# Patient Record
Sex: Female | Born: 1940 | Race: White | Hispanic: No | Marital: Married | Smoking: Light tobacco smoker
Health system: Southern US, Community
[De-identification: ages and names within clinical notes are randomized; demographics above are authoritative.]

## PROBLEM LIST (undated history)

## (undated) DIAGNOSIS — N289 Disorder of kidney and ureter, unspecified: Secondary | ICD-10-CM

## (undated) DIAGNOSIS — Z87442 Personal history of urinary calculi: Secondary | ICD-10-CM

## (undated) DIAGNOSIS — M1990 Unspecified osteoarthritis, unspecified site: Secondary | ICD-10-CM

## (undated) DIAGNOSIS — I714 Abdominal aortic aneurysm, without rupture, unspecified: Secondary | ICD-10-CM

## (undated) DIAGNOSIS — F32A Depression, unspecified: Secondary | ICD-10-CM

## (undated) DIAGNOSIS — C801 Malignant (primary) neoplasm, unspecified: Secondary | ICD-10-CM

## (undated) DIAGNOSIS — J189 Pneumonia, unspecified organism: Secondary | ICD-10-CM

## (undated) DIAGNOSIS — R011 Cardiac murmur, unspecified: Secondary | ICD-10-CM

## (undated) DIAGNOSIS — I10 Essential (primary) hypertension: Secondary | ICD-10-CM

## (undated) DIAGNOSIS — N390 Urinary tract infection, site not specified: Secondary | ICD-10-CM

## (undated) DIAGNOSIS — E785 Hyperlipidemia, unspecified: Secondary | ICD-10-CM

## (undated) DIAGNOSIS — G458 Other transient cerebral ischemic attacks and related syndromes: Secondary | ICD-10-CM

## (undated) DIAGNOSIS — Z8489 Family history of other specified conditions: Secondary | ICD-10-CM

## (undated) DIAGNOSIS — K219 Gastro-esophageal reflux disease without esophagitis: Secondary | ICD-10-CM

## (undated) DIAGNOSIS — F329 Major depressive disorder, single episode, unspecified: Secondary | ICD-10-CM

## (undated) HISTORY — DX: Pneumonia, unspecified organism: J18.9

## (undated) HISTORY — PX: CATARACT EXTRACTION: SUR2

## (undated) HISTORY — PX: BACK SURGERY: SHX140

## (undated) HISTORY — PX: OTHER SURGICAL HISTORY: SHX169

## (undated) HISTORY — DX: Disorder of kidney and ureter, unspecified: N28.9

## (undated) HISTORY — DX: Other transient cerebral ischemic attacks and related syndromes: G45.8

## (undated) HISTORY — DX: Hyperlipidemia, unspecified: E78.5

## (undated) HISTORY — PX: ABDOMINAL HYSTERECTOMY: SHX81

## (undated) HISTORY — DX: Urinary tract infection, site not specified: N39.0

## (undated) HISTORY — DX: Essential (primary) hypertension: I10

## (undated) HISTORY — PX: EYE SURGERY: SHX253

## (undated) HISTORY — PX: DILATION AND CURETTAGE OF UTERUS: SHX78

---

## 1998-11-30 ENCOUNTER — Other Ambulatory Visit: Admission: RE | Admit: 1998-11-30 | Discharge: 1998-11-30 | Payer: Self-pay | Admitting: Obstetrics & Gynecology

## 1998-12-14 ENCOUNTER — Ambulatory Visit (HOSPITAL_COMMUNITY): Admission: RE | Admit: 1998-12-14 | Discharge: 1998-12-14 | Payer: Self-pay | Admitting: Internal Medicine

## 2000-03-11 ENCOUNTER — Other Ambulatory Visit: Admission: RE | Admit: 2000-03-11 | Discharge: 2000-03-11 | Payer: Self-pay | Admitting: Obstetrics and Gynecology

## 2000-03-11 ENCOUNTER — Encounter (INDEPENDENT_AMBULATORY_CARE_PROVIDER_SITE_OTHER): Payer: Self-pay

## 2000-04-08 ENCOUNTER — Encounter: Admission: RE | Admit: 2000-04-08 | Discharge: 2000-04-08 | Payer: Self-pay | Admitting: Obstetrics and Gynecology

## 2000-04-08 ENCOUNTER — Encounter: Payer: Self-pay | Admitting: Obstetrics and Gynecology

## 2001-06-15 ENCOUNTER — Other Ambulatory Visit: Admission: RE | Admit: 2001-06-15 | Discharge: 2001-06-15 | Payer: Self-pay | Admitting: Obstetrics and Gynecology

## 2001-08-27 ENCOUNTER — Encounter: Payer: Self-pay | Admitting: Obstetrics and Gynecology

## 2001-08-27 ENCOUNTER — Encounter: Admission: RE | Admit: 2001-08-27 | Discharge: 2001-08-27 | Payer: Self-pay | Admitting: Obstetrics and Gynecology

## 2002-09-08 ENCOUNTER — Encounter: Admission: RE | Admit: 2002-09-08 | Discharge: 2002-09-08 | Payer: Self-pay | Admitting: Obstetrics and Gynecology

## 2002-09-08 ENCOUNTER — Encounter: Payer: Self-pay | Admitting: Obstetrics and Gynecology

## 2002-11-13 ENCOUNTER — Emergency Department (HOSPITAL_COMMUNITY): Admission: EM | Admit: 2002-11-13 | Discharge: 2002-11-14 | Payer: Self-pay | Admitting: Emergency Medicine

## 2003-09-22 ENCOUNTER — Encounter: Admission: RE | Admit: 2003-09-22 | Discharge: 2003-09-22 | Payer: Self-pay | Admitting: Obstetrics and Gynecology

## 2004-10-08 ENCOUNTER — Ambulatory Visit: Payer: Self-pay | Admitting: Internal Medicine

## 2004-11-05 ENCOUNTER — Encounter: Admission: RE | Admit: 2004-11-05 | Discharge: 2004-11-05 | Payer: Self-pay | Admitting: Obstetrics and Gynecology

## 2004-12-24 ENCOUNTER — Ambulatory Visit: Payer: Self-pay | Admitting: Internal Medicine

## 2004-12-31 ENCOUNTER — Ambulatory Visit: Payer: Self-pay | Admitting: Internal Medicine

## 2005-04-10 ENCOUNTER — Ambulatory Visit: Payer: Self-pay | Admitting: Internal Medicine

## 2005-06-09 ENCOUNTER — Ambulatory Visit: Payer: Self-pay | Admitting: Internal Medicine

## 2005-06-16 ENCOUNTER — Ambulatory Visit: Payer: Self-pay

## 2005-08-20 ENCOUNTER — Ambulatory Visit: Payer: Self-pay | Admitting: Internal Medicine

## 2005-12-09 ENCOUNTER — Encounter: Admission: RE | Admit: 2005-12-09 | Discharge: 2005-12-09 | Payer: Self-pay | Admitting: Obstetrics and Gynecology

## 2006-09-15 ENCOUNTER — Ambulatory Visit: Payer: Self-pay | Admitting: Internal Medicine

## 2006-09-15 LAB — CONVERTED CEMR LAB
Albumin: 4 g/dL (ref 3.5–5.2)
BUN: 18 mg/dL (ref 6–23)
CO2: 28 meq/L (ref 19–32)
Calcium: 9.5 mg/dL (ref 8.4–10.5)
Chloride: 101 meq/L (ref 96–112)
Creatinine, Ser: 1 mg/dL (ref 0.4–1.2)
GFR calc non Af Amer: 60 mL/min
Glomerular Filtration Rate, Af Am: 72 mL/min/{1.73_m2}
HDL: 38.5 mg/dL — ABNORMAL LOW (ref >39.0)
Hemoglobin: 13.1 g/dL (ref 12.0–15.0)
Hgb A1c MFr Bld: 6.9 % — ABNORMAL HIGH (ref 4.6–6.0)
LDL DIRECT: 95.3 mg/dL
Lymphocytes Relative: 34.2 % (ref 12.0–46.0)
MCHC: 34 g/dL (ref 30.0–36.0)
Monocytes Relative: 8 % (ref 3.0–11.0)
Platelets: 320 10*3/uL (ref 150–400)
Total Bilirubin: 0.6 mg/dL (ref 0.3–1.2)
Triglyceride fasting, serum: 263 mg/dL (ref 0–149)

## 2006-09-28 ENCOUNTER — Ambulatory Visit: Payer: Self-pay | Admitting: Internal Medicine

## 2006-12-29 ENCOUNTER — Ambulatory Visit: Payer: Self-pay | Admitting: Internal Medicine

## 2006-12-29 LAB — CONVERTED CEMR LAB
AST: 24 units/L (ref 0–37)
BUN: 14 mg/dL (ref 6–23)
Cholesterol: 154 mg/dL (ref 0–200)
Creatinine, Ser: 0.9 mg/dL (ref 0.4–1.2)
Direct LDL: 70.2 mg/dL
HDL: 43 mg/dL (ref >39.0)
Potassium: 3.4 meq/L — ABNORMAL LOW (ref 3.5–5.1)

## 2007-01-21 ENCOUNTER — Ambulatory Visit: Payer: Self-pay | Admitting: Internal Medicine

## 2007-03-11 ENCOUNTER — Encounter: Payer: Self-pay | Admitting: Internal Medicine

## 2007-04-07 DIAGNOSIS — R0989 Other specified symptoms and signs involving the circulatory and respiratory systems: Secondary | ICD-10-CM

## 2007-04-13 ENCOUNTER — Encounter: Admission: RE | Admit: 2007-04-13 | Discharge: 2007-04-13 | Payer: Self-pay | Admitting: Obstetrics and Gynecology

## 2007-05-19 ENCOUNTER — Ambulatory Visit: Payer: Self-pay | Admitting: Internal Medicine

## 2007-05-25 ENCOUNTER — Encounter (INDEPENDENT_AMBULATORY_CARE_PROVIDER_SITE_OTHER): Payer: Self-pay | Admitting: *Deleted

## 2007-05-25 LAB — CONVERTED CEMR LAB
Direct LDL: 91.8 mg/dL
HDL: 40.4 mg/dL (ref >39.0)
Triglycerides: 342 mg/dL (ref 0–149)

## 2007-06-09 ENCOUNTER — Encounter: Payer: Self-pay | Admitting: Internal Medicine

## 2007-12-20 ENCOUNTER — Telehealth: Payer: Self-pay | Admitting: Internal Medicine

## 2007-12-22 ENCOUNTER — Telehealth (INDEPENDENT_AMBULATORY_CARE_PROVIDER_SITE_OTHER): Payer: Self-pay | Admitting: *Deleted

## 2007-12-24 ENCOUNTER — Ambulatory Visit: Payer: Self-pay | Admitting: Internal Medicine

## 2007-12-24 LAB — CONVERTED CEMR LAB
BUN: 18 mg/dL (ref 6–23)
Potassium: 3.5 meq/L (ref 3.5–5.1)

## 2007-12-30 ENCOUNTER — Ambulatory Visit: Payer: Self-pay | Admitting: Internal Medicine

## 2007-12-30 DIAGNOSIS — E119 Type 2 diabetes mellitus without complications: Secondary | ICD-10-CM

## 2007-12-30 DIAGNOSIS — E1169 Type 2 diabetes mellitus with other specified complication: Secondary | ICD-10-CM | POA: Insufficient documentation

## 2007-12-30 DIAGNOSIS — E782 Mixed hyperlipidemia: Secondary | ICD-10-CM

## 2007-12-30 DIAGNOSIS — F17200 Nicotine dependence, unspecified, uncomplicated: Secondary | ICD-10-CM | POA: Insufficient documentation

## 2007-12-30 DIAGNOSIS — I10 Essential (primary) hypertension: Secondary | ICD-10-CM

## 2007-12-30 DIAGNOSIS — R002 Palpitations: Secondary | ICD-10-CM | POA: Insufficient documentation

## 2007-12-30 DIAGNOSIS — G458 Other transient cerebral ischemic attacks and related syndromes: Secondary | ICD-10-CM

## 2007-12-30 DIAGNOSIS — I152 Hypertension secondary to endocrine disorders: Secondary | ICD-10-CM | POA: Insufficient documentation

## 2007-12-31 ENCOUNTER — Ambulatory Visit: Payer: Self-pay | Admitting: Internal Medicine

## 2008-01-01 LAB — CONVERTED CEMR LAB
ALT: 18 units/L (ref 0–35)
AST: 18 units/L (ref 0–37)
Alkaline Phosphatase: 81 units/L (ref 39–117)
Bilirubin, Direct: 0.1 mg/dL (ref 0.0–0.3)
Cholesterol: 221 mg/dL (ref 0–200)
Creatinine,U: 73.4 mg/dL
Eosinophils Absolute: 0.8 10*3/uL — ABNORMAL HIGH (ref 0.0–0.6)
HCT: 38.6 % (ref 36.0–46.0)
Lymphocytes Relative: 38.3 % (ref 12.0–46.0)
Microalb Creat Ratio: 594 mg/g — ABNORMAL HIGH (ref 0.0–30.0)
Microalb, Ur: 43.6 mg/dL — ABNORMAL HIGH (ref 0.0–1.9)
Monocytes Absolute: 1.1 10*3/uL — ABNORMAL HIGH (ref 0.2–0.7)
Monocytes Relative: 10.5 % (ref 3.0–11.0)
Neutro Abs: 4.5 10*3/uL (ref 1.4–7.7)
Neutrophils Relative %: 43.8 % (ref 43.0–77.0)
Platelets: 298 10*3/uL (ref 150–400)
RBC: 4.25 M/uL (ref 3.87–5.11)
TSH: 2.06 microintl units/mL (ref 0.35–5.50)
Total Bilirubin: 0.6 mg/dL (ref 0.3–1.2)
Triglycerides: 330 mg/dL (ref 0–149)

## 2008-01-03 ENCOUNTER — Encounter (INDEPENDENT_AMBULATORY_CARE_PROVIDER_SITE_OTHER): Payer: Self-pay | Admitting: *Deleted

## 2008-01-06 ENCOUNTER — Telehealth (INDEPENDENT_AMBULATORY_CARE_PROVIDER_SITE_OTHER): Payer: Self-pay | Admitting: *Deleted

## 2008-01-12 ENCOUNTER — Encounter (INDEPENDENT_AMBULATORY_CARE_PROVIDER_SITE_OTHER): Payer: Self-pay | Admitting: Obstetrics and Gynecology

## 2008-01-13 ENCOUNTER — Inpatient Hospital Stay (HOSPITAL_COMMUNITY): Admission: RE | Admit: 2008-01-13 | Discharge: 2008-01-14 | Payer: Self-pay | Admitting: Obstetrics and Gynecology

## 2008-02-28 ENCOUNTER — Ambulatory Visit: Payer: Self-pay | Admitting: Internal Medicine

## 2008-03-01 ENCOUNTER — Telehealth (INDEPENDENT_AMBULATORY_CARE_PROVIDER_SITE_OTHER): Payer: Self-pay | Admitting: *Deleted

## 2008-03-14 ENCOUNTER — Telehealth (INDEPENDENT_AMBULATORY_CARE_PROVIDER_SITE_OTHER): Payer: Self-pay | Admitting: *Deleted

## 2008-03-29 ENCOUNTER — Ambulatory Visit: Payer: Self-pay | Admitting: Internal Medicine

## 2008-03-30 ENCOUNTER — Encounter (INDEPENDENT_AMBULATORY_CARE_PROVIDER_SITE_OTHER): Payer: Self-pay | Admitting: *Deleted

## 2008-04-05 ENCOUNTER — Ambulatory Visit: Payer: Self-pay | Admitting: Internal Medicine

## 2008-04-09 LAB — CONVERTED CEMR LAB
BUN: 16 mg/dL (ref 6–23)
Cholesterol: 206 mg/dL (ref 0–200)
Creatinine, Ser: 0.9 mg/dL (ref 0.4–1.2)
Direct LDL: 96 mg/dL
HDL: 33.6 mg/dL — ABNORMAL LOW (ref >39.0)
Hgb A1c MFr Bld: 6.5 % — ABNORMAL HIGH (ref 4.6–6.0)
Potassium: 3 meq/L — ABNORMAL LOW (ref 3.5–5.1)
Total CHOL/HDL Ratio: 6.1
Triglycerides: 314 mg/dL (ref 0–149)
VLDL: 63 mg/dL — ABNORMAL HIGH (ref 0–40)

## 2008-04-10 ENCOUNTER — Encounter (INDEPENDENT_AMBULATORY_CARE_PROVIDER_SITE_OTHER): Payer: Self-pay | Admitting: *Deleted

## 2008-04-14 ENCOUNTER — Telehealth (INDEPENDENT_AMBULATORY_CARE_PROVIDER_SITE_OTHER): Payer: Self-pay | Admitting: *Deleted

## 2008-04-27 ENCOUNTER — Ambulatory Visit: Payer: Self-pay | Admitting: Cardiology

## 2008-04-28 ENCOUNTER — Ambulatory Visit: Payer: Self-pay | Admitting: Internal Medicine

## 2008-05-04 ENCOUNTER — Telehealth (INDEPENDENT_AMBULATORY_CARE_PROVIDER_SITE_OTHER): Payer: Self-pay | Admitting: *Deleted

## 2008-05-19 ENCOUNTER — Encounter: Payer: Self-pay | Admitting: Cardiology

## 2008-05-19 ENCOUNTER — Ambulatory Visit: Payer: Self-pay

## 2008-05-29 ENCOUNTER — Telehealth (INDEPENDENT_AMBULATORY_CARE_PROVIDER_SITE_OTHER): Payer: Self-pay | Admitting: *Deleted

## 2008-06-05 ENCOUNTER — Ambulatory Visit: Payer: Self-pay | Admitting: Internal Medicine

## 2008-06-07 ENCOUNTER — Ambulatory Visit: Payer: Self-pay | Admitting: Cardiology

## 2008-06-08 ENCOUNTER — Encounter (INDEPENDENT_AMBULATORY_CARE_PROVIDER_SITE_OTHER): Payer: Self-pay | Admitting: *Deleted

## 2008-07-10 ENCOUNTER — Ambulatory Visit: Payer: Self-pay | Admitting: Internal Medicine

## 2008-07-18 ENCOUNTER — Encounter (INDEPENDENT_AMBULATORY_CARE_PROVIDER_SITE_OTHER): Payer: Self-pay | Admitting: *Deleted

## 2008-07-18 LAB — CONVERTED CEMR LAB
ALT: 17 units/L (ref 0–35)
AST: 19 units/L (ref 0–37)
Cholesterol: 165 mg/dL (ref 0–200)
Direct LDL: 100.2 mg/dL

## 2008-07-19 ENCOUNTER — Ambulatory Visit: Payer: Self-pay | Admitting: Internal Medicine

## 2008-07-19 DIAGNOSIS — M25539 Pain in unspecified wrist: Secondary | ICD-10-CM | POA: Insufficient documentation

## 2008-07-21 ENCOUNTER — Encounter (INDEPENDENT_AMBULATORY_CARE_PROVIDER_SITE_OTHER): Payer: Self-pay | Admitting: *Deleted

## 2008-07-21 LAB — CONVERTED CEMR LAB
BUN: 24 mg/dL — ABNORMAL HIGH (ref 6–23)
Creatinine, Ser: 1.1 mg/dL (ref 0.4–1.2)
Potassium: 4.7 meq/L (ref 3.5–5.1)

## 2008-07-31 ENCOUNTER — Telehealth: Payer: Self-pay | Admitting: Internal Medicine

## 2008-08-09 ENCOUNTER — Encounter: Admission: RE | Admit: 2008-08-09 | Discharge: 2008-08-09 | Payer: Self-pay | Admitting: Internal Medicine

## 2008-09-06 ENCOUNTER — Ambulatory Visit: Payer: Self-pay | Admitting: Internal Medicine

## 2009-01-10 ENCOUNTER — Ambulatory Visit: Payer: Self-pay | Admitting: Internal Medicine

## 2009-01-10 LAB — CONVERTED CEMR LAB
ALT: 15 units/L (ref 0–35)
AST: 19 units/L (ref 0–37)
Albumin: 4.3 g/dL (ref 3.5–5.2)
Alkaline Phosphatase: 77 units/L (ref 39–117)
BUN: 22 mg/dL (ref 6–23)
Bilirubin, Direct: 0.1 mg/dL (ref 0.0–0.3)
HDL: 33.3 mg/dL — ABNORMAL LOW (ref >39.0)
Hgb A1c MFr Bld: 6.6 % — ABNORMAL HIGH (ref 4.6–6.0)
Microalb Creat Ratio: 120.6 mg/g — ABNORMAL HIGH (ref 0.0–30.0)
Potassium: 4.2 meq/L (ref 3.5–5.1)
Total Bilirubin: 0.7 mg/dL (ref 0.3–1.2)
Total CHOL/HDL Ratio: 5.3
Total Protein: 7.9 g/dL (ref 6.0–8.3)

## 2009-01-17 ENCOUNTER — Ambulatory Visit: Payer: Self-pay | Admitting: Internal Medicine

## 2009-02-03 DIAGNOSIS — R011 Cardiac murmur, unspecified: Secondary | ICD-10-CM

## 2009-03-26 ENCOUNTER — Encounter (INDEPENDENT_AMBULATORY_CARE_PROVIDER_SITE_OTHER): Payer: Self-pay | Admitting: *Deleted

## 2009-06-01 ENCOUNTER — Encounter (INDEPENDENT_AMBULATORY_CARE_PROVIDER_SITE_OTHER): Payer: Self-pay | Admitting: *Deleted

## 2009-06-11 ENCOUNTER — Ambulatory Visit: Payer: Self-pay | Admitting: Internal Medicine

## 2009-06-11 DIAGNOSIS — IMO0001 Reserved for inherently not codable concepts without codable children: Secondary | ICD-10-CM | POA: Insufficient documentation

## 2009-06-20 ENCOUNTER — Encounter: Admission: RE | Admit: 2009-06-20 | Discharge: 2009-07-25 | Payer: Self-pay | Admitting: Internal Medicine

## 2009-07-11 ENCOUNTER — Ambulatory Visit: Payer: Self-pay | Admitting: Internal Medicine

## 2009-07-17 ENCOUNTER — Encounter (INDEPENDENT_AMBULATORY_CARE_PROVIDER_SITE_OTHER): Payer: Self-pay | Admitting: *Deleted

## 2009-07-17 LAB — CONVERTED CEMR LAB
Cholesterol: 211 mg/dL — ABNORMAL HIGH (ref 0–200)
Creatinine,U: 89.9 mg/dL
HDL: 40.2 mg/dL (ref >39.00)
Hgb A1c MFr Bld: 6.5 % (ref 4.6–6.5)
Triglycerides: 385 mg/dL — ABNORMAL HIGH (ref 0.0–149.0)

## 2009-07-18 ENCOUNTER — Ambulatory Visit: Payer: Self-pay | Admitting: Cardiology

## 2009-07-20 ENCOUNTER — Ambulatory Visit: Payer: Self-pay

## 2009-07-20 ENCOUNTER — Encounter: Payer: Self-pay | Admitting: Cardiology

## 2009-07-25 ENCOUNTER — Ambulatory Visit: Payer: Self-pay | Admitting: Internal Medicine

## 2009-07-25 DIAGNOSIS — M25519 Pain in unspecified shoulder: Secondary | ICD-10-CM | POA: Insufficient documentation

## 2009-07-26 ENCOUNTER — Encounter: Payer: Self-pay | Admitting: Internal Medicine

## 2009-08-01 ENCOUNTER — Telehealth (INDEPENDENT_AMBULATORY_CARE_PROVIDER_SITE_OTHER): Payer: Self-pay | Admitting: *Deleted

## 2009-08-21 ENCOUNTER — Encounter: Payer: Self-pay | Admitting: Internal Medicine

## 2009-08-29 ENCOUNTER — Encounter: Admission: RE | Admit: 2009-08-29 | Discharge: 2009-08-29 | Payer: Self-pay | Admitting: Internal Medicine

## 2009-08-30 ENCOUNTER — Ambulatory Visit: Payer: Self-pay | Admitting: Internal Medicine

## 2009-09-17 ENCOUNTER — Telehealth (INDEPENDENT_AMBULATORY_CARE_PROVIDER_SITE_OTHER): Payer: Self-pay | Admitting: *Deleted

## 2009-10-03 ENCOUNTER — Encounter: Payer: Self-pay | Admitting: Internal Medicine

## 2009-10-29 ENCOUNTER — Telehealth (INDEPENDENT_AMBULATORY_CARE_PROVIDER_SITE_OTHER): Payer: Self-pay | Admitting: *Deleted

## 2009-11-12 ENCOUNTER — Telehealth (INDEPENDENT_AMBULATORY_CARE_PROVIDER_SITE_OTHER): Payer: Self-pay | Admitting: *Deleted

## 2009-11-26 ENCOUNTER — Ambulatory Visit: Payer: Self-pay | Admitting: Internal Medicine

## 2009-11-29 LAB — CONVERTED CEMR LAB
Direct LDL: 76 mg/dL
HDL: 33.2 mg/dL — ABNORMAL LOW (ref >39.00)
Hgb A1c MFr Bld: 6.6 % — ABNORMAL HIGH (ref 4.6–6.5)

## 2009-12-10 ENCOUNTER — Ambulatory Visit: Payer: Self-pay | Admitting: Internal Medicine

## 2010-01-16 ENCOUNTER — Telehealth (INDEPENDENT_AMBULATORY_CARE_PROVIDER_SITE_OTHER): Payer: Self-pay | Admitting: *Deleted

## 2010-02-20 ENCOUNTER — Encounter: Payer: Self-pay | Admitting: Internal Medicine

## 2010-02-23 ENCOUNTER — Encounter: Admission: RE | Admit: 2010-02-23 | Discharge: 2010-02-23 | Payer: Self-pay | Admitting: Orthopedic Surgery

## 2010-02-25 ENCOUNTER — Telehealth (INDEPENDENT_AMBULATORY_CARE_PROVIDER_SITE_OTHER): Payer: Self-pay | Admitting: *Deleted

## 2010-04-05 ENCOUNTER — Telehealth (INDEPENDENT_AMBULATORY_CARE_PROVIDER_SITE_OTHER): Payer: Self-pay | Admitting: *Deleted

## 2010-06-03 ENCOUNTER — Ambulatory Visit: Payer: Self-pay | Admitting: Internal Medicine

## 2010-06-03 LAB — CONVERTED CEMR LAB
Cholesterol: 202 mg/dL — ABNORMAL HIGH (ref 0–200)
Hgb A1c MFr Bld: 6.9 % — ABNORMAL HIGH (ref 4.6–6.5)
Triglycerides: 383 mg/dL — ABNORMAL HIGH (ref 0.0–149.0)
VLDL: 76.6 mg/dL — ABNORMAL HIGH (ref 0.0–40.0)

## 2010-06-10 ENCOUNTER — Ambulatory Visit: Payer: Self-pay | Admitting: Internal Medicine

## 2010-06-10 DIAGNOSIS — R1084 Generalized abdominal pain: Secondary | ICD-10-CM | POA: Insufficient documentation

## 2010-06-10 DIAGNOSIS — M549 Dorsalgia, unspecified: Secondary | ICD-10-CM | POA: Insufficient documentation

## 2010-06-10 LAB — CONVERTED CEMR LAB
Blood in Urine, dipstick: NEGATIVE
Ketones, urine, test strip: NEGATIVE
Protein, U semiquant: NEGATIVE
Urobilinogen, UA: NEGATIVE
WBC Urine, dipstick: NEGATIVE
pH: 6

## 2010-06-11 ENCOUNTER — Encounter (INDEPENDENT_AMBULATORY_CARE_PROVIDER_SITE_OTHER): Payer: Self-pay | Admitting: *Deleted

## 2010-06-11 ENCOUNTER — Encounter: Payer: Self-pay | Admitting: Internal Medicine

## 2010-06-11 LAB — CONVERTED CEMR LAB
Albumin: 4.5 g/dL (ref 3.5–5.2)
Amylase: 83 units/L (ref 27–131)
Basophils Absolute: 0.1 10*3/uL (ref 0.0–0.1)
Basophils Relative: 0.7 % (ref 0.0–3.0)
Bilirubin, Direct: 0.1 mg/dL (ref 0.0–0.3)
Eosinophils Absolute: 0.5 10*3/uL (ref 0.0–0.7)
Eosinophils Relative: 4.6 % (ref 0.0–5.0)
HCT: 34.7 % — ABNORMAL LOW (ref 36.0–46.0)
MCHC: 33.9 g/dL (ref 30.0–36.0)
Monocytes Absolute: 1.1 10*3/uL — ABNORMAL HIGH (ref 0.1–1.0)
Monocytes Relative: 9.2 % (ref 3.0–12.0)
Platelets: 367 10*3/uL (ref 150.0–400.0)
RBC: 3.77 M/uL — ABNORMAL LOW (ref 3.87–5.11)

## 2010-06-13 ENCOUNTER — Encounter: Admission: RE | Admit: 2010-06-13 | Discharge: 2010-06-13 | Payer: Self-pay | Admitting: Internal Medicine

## 2010-07-05 ENCOUNTER — Telehealth (INDEPENDENT_AMBULATORY_CARE_PROVIDER_SITE_OTHER): Payer: Self-pay | Admitting: *Deleted

## 2010-07-08 ENCOUNTER — Encounter: Payer: Self-pay | Admitting: Internal Medicine

## 2010-07-10 ENCOUNTER — Telehealth (INDEPENDENT_AMBULATORY_CARE_PROVIDER_SITE_OTHER): Payer: Self-pay | Admitting: *Deleted

## 2010-07-23 ENCOUNTER — Telehealth (INDEPENDENT_AMBULATORY_CARE_PROVIDER_SITE_OTHER): Payer: Self-pay | Admitting: *Deleted

## 2010-07-24 ENCOUNTER — Ambulatory Visit: Payer: Self-pay | Admitting: Gastroenterology

## 2010-07-24 ENCOUNTER — Encounter (INDEPENDENT_AMBULATORY_CARE_PROVIDER_SITE_OTHER): Payer: Self-pay | Admitting: *Deleted

## 2010-07-24 DIAGNOSIS — K529 Noninfective gastroenteritis and colitis, unspecified: Secondary | ICD-10-CM | POA: Insufficient documentation

## 2010-07-24 DIAGNOSIS — R197 Diarrhea, unspecified: Secondary | ICD-10-CM

## 2010-07-25 LAB — CONVERTED CEMR LAB
ALT: 20 units/L (ref 0–35)
AST: 21 units/L (ref 0–37)
Basophils Absolute: 0.1 10*3/uL (ref 0.0–0.1)
CO2: 24 meq/L (ref 19–32)
Chloride: 108 meq/L (ref 96–112)
Eosinophils Absolute: 0.7 10*3/uL (ref 0.0–0.7)
GFR calc non Af Amer: 53.69 mL/min (ref >60)
Hemoglobin: 11.8 g/dL — ABNORMAL LOW (ref 12.0–15.0)
Lymphocytes Relative: 35.5 % (ref 12.0–46.0)
Lymphs Abs: 4.1 10*3/uL — ABNORMAL HIGH (ref 0.7–4.0)
MCHC: 34.4 g/dL (ref 30.0–36.0)
MCV: 90.8 fL (ref 78.0–100.0)
Neutro Abs: 5.5 10*3/uL (ref 1.4–7.7)
Potassium: 4.6 meq/L (ref 3.5–5.1)
RDW: 13.3 % (ref 11.5–14.6)
Sed Rate: 42 mm/hr — ABNORMAL HIGH (ref 0–22)
Sodium: 140 meq/L (ref 135–145)
TSH: 1.45 microintl units/mL (ref 0.35–5.50)

## 2010-07-26 ENCOUNTER — Encounter: Payer: Self-pay | Admitting: Internal Medicine

## 2010-07-30 ENCOUNTER — Encounter: Payer: Self-pay | Admitting: Internal Medicine

## 2010-08-02 ENCOUNTER — Ambulatory Visit: Payer: Self-pay | Admitting: Gastroenterology

## 2010-08-07 ENCOUNTER — Encounter: Payer: Self-pay | Admitting: Internal Medicine

## 2010-08-30 ENCOUNTER — Encounter: Admission: RE | Admit: 2010-08-30 | Discharge: 2010-08-30 | Payer: Self-pay | Admitting: Internal Medicine

## 2010-08-30 LAB — HM MAMMOGRAPHY: HM Mammogram: NEGATIVE

## 2010-09-04 ENCOUNTER — Ambulatory Visit: Payer: Self-pay | Admitting: Internal Medicine

## 2010-09-04 ENCOUNTER — Ambulatory Visit: Payer: Self-pay | Admitting: Gastroenterology

## 2010-09-04 DIAGNOSIS — J441 Chronic obstructive pulmonary disease with (acute) exacerbation: Secondary | ICD-10-CM | POA: Insufficient documentation

## 2010-09-05 LAB — CONVERTED CEMR LAB
Basophils Relative: 0.3 % (ref 0.0–3.0)
Eosinophils Relative: 2 % (ref 0.0–5.0)
HCT: 34.3 % — ABNORMAL LOW (ref 36.0–46.0)
Hemoglobin: 11.6 g/dL — ABNORMAL LOW (ref 12.0–15.0)
Lymphs Abs: 3.3 10*3/uL (ref 0.7–4.0)
Monocytes Absolute: 2.3 10*3/uL — ABNORMAL HIGH (ref 0.1–1.0)
Monocytes Relative: 11.9 % (ref 3.0–12.0)
Platelets: 316 10*3/uL (ref 150.0–400.0)

## 2010-09-06 ENCOUNTER — Telehealth: Payer: Self-pay | Admitting: Internal Medicine

## 2010-09-11 ENCOUNTER — Telehealth: Payer: Self-pay | Admitting: Internal Medicine

## 2010-09-12 ENCOUNTER — Telehealth: Payer: Self-pay | Admitting: Internal Medicine

## 2010-09-23 ENCOUNTER — Telehealth: Payer: Self-pay | Admitting: Internal Medicine

## 2010-10-07 ENCOUNTER — Telehealth (INDEPENDENT_AMBULATORY_CARE_PROVIDER_SITE_OTHER): Payer: Self-pay | Admitting: *Deleted

## 2010-10-11 ENCOUNTER — Ambulatory Visit: Payer: Self-pay | Admitting: Internal Medicine

## 2010-10-14 LAB — CONVERTED CEMR LAB: Vit D, 25-Hydroxy: 87 ng/mL (ref 30–89)

## 2010-10-22 ENCOUNTER — Encounter: Payer: Self-pay | Admitting: Internal Medicine

## 2010-10-22 ENCOUNTER — Ambulatory Visit: Payer: Self-pay

## 2010-10-22 DIAGNOSIS — I739 Peripheral vascular disease, unspecified: Secondary | ICD-10-CM

## 2010-10-24 ENCOUNTER — Ambulatory Visit: Payer: Self-pay | Admitting: Internal Medicine

## 2010-10-24 DIAGNOSIS — I10 Essential (primary) hypertension: Secondary | ICD-10-CM | POA: Insufficient documentation

## 2010-10-29 ENCOUNTER — Ambulatory Visit: Payer: Self-pay | Admitting: Cardiology

## 2010-10-29 ENCOUNTER — Encounter: Payer: Self-pay | Admitting: Cardiology

## 2010-11-13 ENCOUNTER — Ambulatory Visit
Admission: RE | Admit: 2010-11-13 | Discharge: 2010-11-13 | Payer: Self-pay | Source: Home / Self Care | Attending: Gastroenterology | Admitting: Gastroenterology

## 2010-11-20 ENCOUNTER — Ambulatory Visit: Admission: RE | Admit: 2010-11-20 | Discharge: 2010-11-20 | Payer: Self-pay | Source: Home / Self Care

## 2010-11-20 ENCOUNTER — Encounter: Payer: Self-pay | Admitting: Cardiology

## 2010-11-22 ENCOUNTER — Telehealth: Payer: Self-pay | Admitting: Internal Medicine

## 2010-12-08 LAB — CONVERTED CEMR LAB
Cholesterol, target level: 200 mg/dL
HDL goal, serum: 40 mg/dL

## 2010-12-12 NOTE — Letter (Signed)
Summary: Diabetic Instructions  [facility]  [place], [place] [address]   Phone: [phone]  Fax: [phone]    Sheila [facility]ton 11-Jan-1941 MRN: [id number]   X   ORAL DIABETIC MEDICATION INSTRUCTIONS  The day before your procedure:   Take your diabetic pill as you do normally  The day of your procedure:   Do not take your diabetic pill    We will check your blood sugar levels during the admission process and again in Recovery before discharging you home  ________________________________________________________________________

## 2010-12-12 NOTE — Assessment & Plan Note (Signed)
Summary: HIGH BP//PH   Vital Signs:  Patient profile:   70 year old female Weight:      157.2 pounds BMI:     26.25 Temp:     98.3 degrees F oral Pulse rate:   112 / minute BP sitting:   140 / 88  (left arm) Cuff size:   large  Vitals Entered By: Georgette Dover CMA (October 24, 2010 9:13 AM) CC: 1.) Elevated B/P and increased pulse  2.) Refill Valium   Primary Care Provider:  Unice Cobble, MD   CC:  1.) Elevated B/P and increased pulse  2.) Refill Valium.  History of Present Illness:      This is a 70 year old woman who presents for follow up of poorly controlled  Hypertension.BP was profoundly elevated @ [facility]. That report reviewed & copy given to her.Diovan was increased to twice daily.BP had been controlled with  Amlodipine 5 mg twice daily. It was stopped 07/23/10  due to  concomitant therapy with Simvastatin 40 mg daily. On that statin she has had leg  cramps which have resolved after it was stopped 2 weeks ago. Buttocks cramps are also slightly better , "best in 6 months".The patient denies lightheadedness, urinary frequency, headaches, edema, and fatigue.  Associated symptoms include palpitations as occasional fluttering . She did not have this on the CCB, Amlodipine.  The patient denies the following associated symptoms: chest pain, chest pressure, dyspnea, and syncope.  The patient reports that dietary compliance has been good.  The patient reports no exercise.  Adjunctive measures currently used by the patient include salt restriction.    Current Medications (verified): 1)  Simvastatin 40 Mg  Tabs (Simvastatin) .... Take One Tablet Daily 2)  Metformin Hcl 500 Mg  Tabs (Metformin Hcl) .Marland Kitchen.. 1 By Mouth Twice Daily With Two Largest Meals **lab Appointment Due** 3)  Valium 5 Mg Tabs (Diazepam) .... 1/2 To 1 By Mouth As Needed 4)  Onetouch Ultra Test  Strp (Glucose Blood) .... Test Once Daily As Directed 5)  Labetalol Hcl 300 Mg Tabs (Labetalol Hcl) .Marland Kitchen.. 1 By Mouth  Bid 6)  Ester-C  Tabs (Bioflavonoid Products) .Marland Kitchen.. 1 Tab By Mouth Once Daily 7)  Spironolactone 25 Mg  Tabs (Spironolactone) .... Take One Tablet Daily 8)  Allegra 180 Mg  Tabs (Fexofenadine Hcl) .... As Needed 9)  Ibuprofen 200 Mg Tabs (Ibuprofen) .... Up To 6 Tabs Daily 10)  Glucobetic .Marland Kitchen.. 1 By Mouth Two Times A Day 11)  New Life Formula For Women .Marland Kitchen.. 1 By Mouth Once Daily 12)  Wild Yam Cream .... As Directed 13)  Diovan 160 Mg Tabs (Valsartan) .Marland Kitchen.. 1 Once Daily 14)  Vitamin D3 400 Unit Tabs (Cholecalciferol) .Marland Kitchen.. 1 Tab By Mouth Once Daily 15)  Vitamin B-12 100 Mcg Tabs (Cyanocobalamin) .Marland Kitchen.. 1 By Mouth Once Daily 16)  Vitamin E 100 Unit Caps (Vitamin E) .Marland Kitchen.. 1 By Mouth Once Daily 17)  Prednisone 5 Mg  Tabs (Prednisone) .... Take As Directed  Allergies: 1)  ! Amoxicillin 2)  ! Captopril (Captopril) 3)  ! Simvastatin (Simvastatin)  Physical Exam  General:  in no acute distress; alert,appropriate and cooperative throughout examination Lungs:  Normal respiratory effort, chest expands symmetrically. Lungs are clear to auscultation, no crackles or wheezes. Heart:  normal rate, regular rhythm, no gallop, no rub, no JVD, no HJR, and grade 1 /6 systolic murmur.   Abdomen:  Bowel sounds positive,abdomen soft and non-tender without masses, organomegaly or hernias noted.  No AAA or bruits Pulses:  R and L carotid,radial,dorsalis pedis and posterior tibial pulses are full and equal bilaterally. L carotid bruit Extremities:  No clubbing, cyanosis, edema. Neurologic:  alert & oriented X3.   Skin:  Intact without suspicious lesions or rashes. No ischemic changes of feet   Impression & Recommendations:  Problem # 1:  UNSPECIFIED ESSENTIAL HYPERTENSION (ICD-401.9) UNCONTROLLED Her updated medication list for this problem includes:    Labetalol Hcl 300 Mg Tabs (Labetalol hcl) .Marland Kitchen... 1 by mouth bid    Spironolactone 25 Mg Tabs (Spironolactone) .Marland Kitchen... Take one tablet daily    Diovan 160 Mg Tabs  (Valsartan) .Marland Kitchen... 1 once daily    Amlodipine Besylate 5 Mg Tabs (Amlodipine besylate) .Marland Kitchen... 1 two times a day  Problem # 2:  SMOKER (ICD-305.1) Pathophysiology of Nicotine addiction discussed; risks & options discussed  Complete Medication List: 1)  Metformin Hcl 500 Mg Tabs (Metformin hcl) .Marland Kitchen.. 1 by mouth twice daily with two largest meals **lab appointment due** 2)  Valium 5 Mg Tabs (Diazepam) .... 1/2 to 1 by mouth as needed 3)  Onetouch Ultra Test Strp (Glucose blood) .... Test once daily as directed 4)  Labetalol Hcl 300 Mg Tabs (Labetalol hcl) .Marland Kitchen.. 1 by mouth bid 5)  Ester-c Tabs (Bioflavonoid products) .Marland Kitchen.. 1 tab by mouth once daily 6)  Spironolactone 25 Mg Tabs (Spironolactone) .... Take one tablet daily 7)  Allegra 180 Mg Tabs (Fexofenadine hcl) .... As needed 8)  Ibuprofen 200 Mg Tabs (Ibuprofen) .... Up to 6 tabs daily 9)  Glucobetic  .Marland Kitchen.. 1 by mouth two times a day 10)  New Life Formula For Women  .Marland Kitchen.. 1 by mouth once daily 11)  Wild Yam Cream  .... As directed 12)  Diovan 160 Mg Tabs (Valsartan) .Marland Kitchen.. 1 once daily 13)  Vitamin D3 400 Unit Tabs (Cholecalciferol) .Marland Kitchen.. 1 tab by mouth once daily 14)  Vitamin B-12 100 Mcg Tabs (Cyanocobalamin) .Marland Kitchen.. 1 by mouth once daily 15)  Vitamin E 100 Unit Caps (Vitamin e) .Marland Kitchen.. 1 by mouth once daily 16)  Prednisone 5 Mg Tabs (Prednisone) .... Take as directed 17)  Amlodipine Besylate 5 Mg Tabs (Amlodipine besylate) .Marland Kitchen.. 1 two times a day 18)  Crestor 10 Mg Tabs (Rosuvastatin calcium) .... 1/2 once daily  Patient Instructions: 1)  Please schedule a follow-up appointment in 3 months. 2)  Stop Smoking Tips: Choose a Quit date. Cut down before the Quit date. decide what you will do as a substitute when you feel the urge to smoke(gum,toothpick,exercise). 3)  CPK  prior to visit, ICD-9:995.20,729.1 4)  Hepatic Panel prior to visit, ICD-9:995.20 5)  Lipid Panel prior to visit, ICD-9:272.4 6)  Check your Blood Pressure regularly. If it is  above:135/85 ON AVERAGE  you should make an appointment. 7)  HbgA1C prior to visit, ICD-9:250.00 Prescriptions: CRESTOR 10 MG TABS (ROSUVASTATIN CALCIUM) 1/2 once daily  #42 x 0   Entered and Authorized by:   Unice Cobble MD   Signed by:   Unice Cobble MD on 10/24/2010   Method used:   Samples Given   RxID:   [id number]    Orders Added: 1)  Est. Patient Level IV [id number]

## 2010-12-12 NOTE — Progress Notes (Signed)
Summary: Refill Request  Phone Note Refill Request Message from:  Pharmacy on [facility] Fax#: [phone]  Refills Requested: Medication #1:  METFORMIN HCL 500 MG  TABS TAKE ONE TABLET TWICE DAILY WITH TWO LARGEST MEALS   Dosage confirmed as above?Dosage Confirmed   Supply Requested: 3 months   Notes: 1 refill Initial call taken by: Elna Breslow,  February 25, 2010 9:32 AM  Follow-up for Phone Call        Faxed to [phone] Follow-up by: Georgette Dover,  February 25, 2010 10:18 AM    Prescriptions: METFORMIN HCL 500 MG  TABS (METFORMIN HCL) TAKE ONE TABLET TWICE DAILY WITH TWO LARGEST MEALS  #180 x 1   Entered by:   Georgette Dover   Authorized by:   Unice Cobble MD   Signed by:   Georgette Dover on 02/25/2010   Method used:   Print then Give to Patient   RxID:   [id number]

## 2010-12-12 NOTE — Assessment & Plan Note (Signed)
History of Present Illness Visit Type: consult  Primary GI MD: Owens Loffler MD Primary Provider: Unice Cobble, MD  Requesting Provider: Unice Cobble, MD  Chief Complaint: diarrhea  History of Present Illness:      very pleasant 70 year old woman Who has been having buttocks discomforts.  Could not walk very well because of the pains.  then pains on both sides, flanks.  Myopathy like pains.  Then diarrhea started.  Never had diarrhea before.  Would go up to 6 times a day.  diarrhea lasted for 4-5 weeks Stopped amlodipine yesterday and had NO loose stools today.  Non-bloody, no nocturnal symptoms.  no abdomina pains.  was recently told by Dr. Linna Darner that simvastatin, amlodipine combination can be bad.    may have been on antibiotics this past summer.             Current Medications (verified): 1)  Simvastatin 40 Mg  Tabs (Simvastatin) .... Take One Tablet Daily 2)  Metformin Hcl 500 Mg  Tabs (Metformin Hcl) .... Take One Tablet Twice Daily With Two Largest Meals 3)  Valium 5 Mg Tabs (Diazepam) .... 1/2 To 1 By Mouth As Needed 4)  Onetouch Ultra Test  Strp (Glucose Blood) .... Test Once Daily As Directed 5)  Labetalol Hcl 200 Mg Tabs (Labetalol Hcl) .Marland Kitchen.. 1 1/2 By Mouth Two Times A Day 6)  Ester-C  Tabs (Bioflavonoid Products) .Marland Kitchen.. 1 Tab By Mouth Once Daily 7)  Spironolactone 25 Mg  Tabs (Spironolactone) .... Take One Tablet Daily 8)  Allegra 180 Mg  Tabs (Fexofenadine Hcl) .... As Needed 9)  Tylenol Extra Strength 500 Mg Tabs (Acetaminophen) .Marland Kitchen.. 1 By Mouth As Needed 10)  Tylenol Arthritis Pain 650 Mg Cr-Tabs (Acetaminophen) .Marland Kitchen.. 1 By Mouth At Bedtime 11)  Glucobetic .Marland Kitchen.. 1 By Mouth Two Times A Day 12)  New Life Formula For Women .Marland Kitchen.. 1 By Mouth Once Daily 13)  Wild Yam Cream .... As Directed 14)  Diovan 160 Mg Tabs (Valsartan) .Marland Kitchen.. 1 Once Daily 15)  Vitamin D3 400 Unit Tabs (Cholecalciferol) .Marland Kitchen.. 1 Tab By Mouth Once Daily 16)  Vitamin B-12 100 Mcg Tabs (Cyanocobalamin)  .Marland Kitchen.. 1 By Mouth Once Daily 17)  Vitamin E 100 Unit Caps (Vitamin E) .Marland Kitchen.. 1 By Mouth Once Daily  Allergies (verified): 1)  ! Amoxicillin 2)  ! 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) UNSPECIFIED ESSENTIAL HYPERTENSION (ICD-401.9) DIABETES MELLITUS, TYPE II (ICD-250.00) Family Hx of CONGESTIVE HEART FAILURE (ICD-428.0) FAMILY HISTORY DIABETES 1ST DEGREE RELATIVE (ICD-V18.0) FAMILY HISTORY OF CAD FEMALE 1ST DEGREE RELATIVE <60 (ICD-V16.49) CAROTID BRUIT (ICD-785.9)     Past Surgical History: G1 P1;D&c Cataract extraction OS   Family History: Family History of CAD Female 1st degree relative <60 Family History Diabetes 1st degree relative Mother: died at 31 of CHF  Siblings: Family History of Coronary Artery Disease:  Family History of Hypertension:    Social History: Current Smoker Alcohol use-no retired drinks no alcohol, drinks 3 caffeinated beverages a day  Review of Systems       Pertinent positive and negative review of systems were noted in the above HPI and GI specific review of systems.  All other review of systems was otherwise negative.   Vital Signs:  Patient profile:   70 year old female Height:      65 inches Weight:  157 pounds BMI:     26.22 BSA:     1.79 Pulse rate:   80 / minute Pulse rhythm:   regular BP sitting:   132 / 80  (left arm) Cuff size:   regular  Vitals Entered By: Hope Pigeon CMA (July 24, 2010 2:39 PM)  Physical Exam  Additional Exam:  Constitutional: generally well appearing Psychiatric: alert and oriented times 3 Eyes: extraocular movements intact Mouth: oropharynx moist, no lesions Neck: supple, no lymphadenopathy Cardiovascular: heart regular rate and rythm Lungs: CTA  bilaterally Abdomen: soft, non-tender, non-distended, no obvious ascites, no peritoneal signs, normal bowel sounds Extremities: no lower extremity edema bilaterally Skin: no lesions on visible extremities    Impression & Recommendations:  Problem # 1:  Diarrhea diarrhea is listed as a side effect to simvastatin however is quite down list. It is not listed as a side effect for amlodipine. She really thinks that this combination of medicines has caused her diarrhea perhaps he is right however think we should proceed with full colonoscopy to exclude other causes such as neoplasm, inflammatory bowel disease, perhaps infectious etiology. She also get a basic set of labs including a CBC, complete metabolic profile, thyroid testing and sedimentation rate.  Other Orders: TLB-CBC Platelet - w/Differential (85025-CBCD) TLB-CMP (Comprehensive Metabolic Pnl) (0000000) TLB-TSH (Thyroid Stimulating Hormone) (84443-TSH) TLB-Sedimentation Rate (ESR) (85652-ESR)  Patient Instructions: 1)  You will be scheduled to have a colonoscopy. 2)  You will get lab test(s) done today (cbc, cmet, tsh, esr). 3)  The medication list was reviewed and reconciled.  All changed / newly prescribed medications were explained.  A complete medication list was provided to the patient / caregiver.  Appended Document: Orders Update/movi    Clinical Lists Changes  Medications: Added new medication of MOVIPREP 100 GM  SOLR (PEG-KCL-NACL-NASULF-NA ASC-C) As per prep instructions. - Signed Rx of MOVIPREP 100 GM  SOLR (PEG-KCL-NACL-NASULF-NA ASC-C) As per prep instructions.;  #1 x 0;  Signed;  Entered by: Christian Mate CMA (AAMA);  Authorized by: Milus Banister MD;  Method used: Electronically to [facility] [id number]*, [address] [place], [place]  [address], Ph: [phone] or [phone], Fax: [phone] Orders: Added new Test order of Colonoscopy (Colon) - Signed    Prescriptions: MOVIPREP 100 GM  SOLR  (PEG-KCL-NACL-NASULF-NA ASC-C) As per prep instructions.  #1 x 0   Entered by:   Christian Mate CMA ([place])   Authorized by:   Milus Banister MD   Signed by:   Christian Mate CMA ([place]) on 07/24/2010   Method used:   Electronically to        [facility] [phone]* (retail)       [address]       [place], [place]  [address]       Ph: [phone] or [phone]       Fax: [phone]   RxID:   [id number]

## 2010-12-12 NOTE — Progress Notes (Signed)
Summary: RX correction  Phone Note Call from Patient Call back at Home Phone [phone]   Summary of Call: Patient called to notify that bp prescription was incorrect. Patient previously told me that 2ppo once daily of the 300mg  tabs was too much. Patient now denies any trouble with that dosage and would like Labetalol 300mg  1 by mouth two times a day sent to [facility]. Initial call taken by: Ernestene Mention CMA,  September 23, 2010 4:29 PM    New/Updated Medications: LABETALOL HCL 300 MG TABS (LABETALOL HCL) 1 by mouth bid Prescriptions: LABETALOL HCL 300 MG TABS (LABETALOL HCL) 1 by mouth bid  #180 x 2   Entered by:   Ernestene Mention CMA   Authorized by:   Unice Cobble MD   Signed by:   Ernestene Mention CMA on 09/23/2010   Method used:   Faxed to ...       [facility] (mail-order)             , [place]         Ph: [phone]       Fax: [phone]   RxID:   [id number]

## 2010-12-12 NOTE — Progress Notes (Signed)
Summary: bump on tongue  Phone Note Call from Patient Call back at Home Phone [phone]   Summary of Call: Patient left message on triage that she was given Doxycycline and now has a white bump on the side of her tongue. She would like to know if this is harmful and should she continue the medicine. Please advise. Initial call taken by: Ernestene Mention CMA,  September 06, 2010 4:32 PM  Follow-up for Phone Call        Per MD-- no cause for concern and continue med until finished. Patient notified and expressed understanding. Follow-up by: Ernestene Mention CMA,  September 06, 2010 4:58 PM

## 2010-12-12 NOTE — Assessment & Plan Note (Signed)
Summary: rov/check up      Allergies Added:   Visit Type:  Follow-up Referring Provider:  Unice Cobble, MD  Primary Provider:  Unice Cobble, MD    History of Present Illness: Sheila Melton is an extremely pleasant  female who I have seen in the past for subclavian stenosis and hypertension.  Note, previous Myoview in December 2001 showed an ejection fraction of 74% with no ischemia or infarction.  Echocardiogram on May 19, 2008 showed LV function was normal.  There was mild aortic sclerosis.  There was mild mitral regurgitation. Last carotid Dopplers were performed in Sept 2009 and showed 0-39% bilateral internal carotid artery stenosis. There was moderate ostial left vertebral stenosis. The bilateral brachial artery waveforms were brisk and triphasic. Patient had normal ABIs in December of 2011. I last saw her in Sept 2009. Since then the patient denies any dyspnea on exertion, orthopnea, PND, pedal edema, palpitations, syncope or chest pain.   Current Medications (verified): 1)  Metformin Hcl 500 Mg  Tabs (Metformin Hcl) .Marland Kitchen.. 1 By Mouth Twice Daily With Two Largest Meals **lab Appointment Due** 2)  Valium 5 Mg Tabs (Diazepam) .... 1/2 To 1 By Mouth As Needed 3)  Onetouch Ultra Test  Strp (Glucose Blood) .... Test Once Daily As Directed 4)  Labetalol Hcl 300 Mg Tabs (Labetalol Hcl) .Marland Kitchen.. 1 By Mouth Bid 5)  Ester-C  Tabs (Bioflavonoid Products) .Marland Kitchen.. 1 Tab By Mouth Once Daily 6)  Spironolactone 25 Mg  Tabs (Spironolactone) .... Take One Tablet Daily 7)  Allegra 180 Mg  Tabs (Fexofenadine Hcl) .... As Needed 8)  Ibuprofen 200 Mg Tabs (Ibuprofen) .... Up To 6 Tabs Daily 9)  Glucobetic .Marland Kitchen.. 1 By Mouth Two Times A Day 10)  New Life Formula For Women .Marland Kitchen.. 1 By Mouth Once Daily 11)  Wild Yam Cream .... As Directed 12)  Diovan 160 Mg Tabs (Valsartan) .Marland Kitchen.. 1 Once Daily 13)  Vitamin D3 400 Unit Tabs (Cholecalciferol) .Marland Kitchen.. 1 Tab By Mouth Once Daily 14)  Vitamin B-12 100 Mcg Tabs (Cyanocobalamin)  .Marland Kitchen.. 1 By Mouth Once Daily 15)  Vitamin E 100 Unit Caps (Vitamin E) .Marland Kitchen.. 1 By Mouth Once Daily 16)  Prednisone 5 Mg  Tabs (Prednisone) .... Take As Directed 17)  Amlodipine Besylate 5 Mg Tabs (Amlodipine Besylate) .Marland Kitchen.. 1 Two Times A Day 18)  Crestor 10 Mg Tabs (Rosuvastatin Calcium) .... 1/2 Once Daily  Allergies (verified): 1)  ! Amoxicillin 2)  ! Captopril (Captopril) 3)  ! 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 G1 P1;D&c Cataract extraction OS   Social History: Reviewed history from 07/24/2010 and no changes required. Current Smoker Alcohol use-no retired drinks no alcohol, drinks 3 caffeinated beverages a day  Review of Systems       Problems with cramping in her lower extremities bilaterally but no fevers or chills, productive cough, hemoptysis, dysphasia, odynophagia, melena, hematochezia, dysuria, hematuria, rash, seizure activity, orthopnea, PND, pedal edema, claudication. Remaining systems are negative.   Vital Signs:  Patient profile:   70 year old female Height:      65 inches Weight:      158 pounds Pulse rate:   100 / minute Pulse rhythm:   regular BP sitting:   130 / 64  (right arm)  Physical Exam  General:  Well-developed  well-nourished in no acute distress.  Skin is warm and dry.  HEENT is normal.  Neck is supple. No thyromegaly.  Chest is clear to auscultation with normal expansion.  Cardiovascular exam is regular rate and rhythm. 2/6 systolic ejection murmur. Abdominal exam nontender or distended. No masses palpated. Extremities show no edema. neuro grossly intact    EKG  Procedure date:  10/29/2010  Findings:      Sinus rhythm with no ST changes.  Impression &  Recommendations:  Problem # 1:  ESSENTIAL HYPERTENSION, MALIGNANT (ICD-401.0) Blood pressure controlled on present medications. Will continue. Potassium and renal function monitored by primary care. Her updated medication list for this problem includes:    Labetalol Hcl 300 Mg Tabs (Labetalol hcl) .Marland Kitchen... 1 by mouth bid    Spironolactone 25 Mg Tabs (Spironolactone) .Marland Kitchen... Take one tablet daily    Diovan 160 Mg Tabs (Valsartan) .Marland Kitchen... 1 once daily    Amlodipine Besylate 5 Mg Tabs (Amlodipine besylate) .Marland Kitchen... 1 two times a day  Her updated medication list for this problem includes:    Labetalol Hcl 300 Mg Tabs (Labetalol hcl) .Marland Kitchen... 1 by mouth bid    Spironolactone 25 Mg Tabs (Spironolactone) .Marland Kitchen... Take one tablet daily    Diovan 160 Mg Tabs (Valsartan) .Marland Kitchen... 1 once daily    Amlodipine Besylate 5 Mg Tabs (Amlodipine besylate) .Marland Kitchen... 1 two times a day  Problem # 2:  SMOKER (ICD-305.1) Pt counseled on discontinuing.  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 daily. Followup carotid Doppler.  Problem # 4:  OTHER AND UNSPECIFIED HYPERLIPIDEMIA (ICD-272.4) Continue statin. She is complaining of lower extremity cramping. Her Crestor could be contributing. If her symptoms persist I have asked her to contact her primary care physician and it would be reasonable to hold this medication 4 weeks to see if her symptoms improve. Her updated medication list for this problem includes:    Crestor 10 Mg Tabs (Rosuvastatin calcium) .Marland Kitchen... 1/2 once daily  Problem # 5:  DIABETES MELLITUS, TYPE II (ICD-250.00)  Her updated medication list for this problem includes:    Metformin Hcl 500 Mg Tabs (Metformin hcl) .Marland Kitchen... 1 by mouth twice daily with two largest meals **lab appointment due**    Diovan 160 Mg Tabs (Valsartan) .Marland Kitchen... 1 once daily  Other Orders: EKG w/ Interpretation (93000) Carotid Duplex (Carotid Duplex)  Patient Instructions: 1)  Your physician recommends that you  schedule a follow-up appointment in: [place] 2)  Your physician recommends that you continue on your current medications as directed. Please refer to the Current Medication list given to you today. 3)  Your physician has requested that you have a carotid duplex. This test is an ultrasound of the carotid arteries in your neck. It looks at blood flow through these arteries that supply the brain with blood. Allow one hour for this exam. There are no restrictions or special instructions.

## 2010-12-12 NOTE — Letter (Signed)
Summary: [facility]   Imported By: Edmonia [name] 03/12/2010 [id number]  _____________________________________________________________________  External Attachment:    Type:   Image     Comment:   External Document

## 2010-12-12 NOTE — Miscellaneous (Signed)
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

## 2010-12-12 NOTE — Progress Notes (Signed)
Summary: REFILL  Phone Note Refill Request Message from:  Fax from Pharmacy on [facility] [address] FAX [phone]  DIAZEPAM 5 MG   Initial call taken by: Silva Bandy,  November 12, 2009 11:44 AM    Prescriptions: VALIUM 5 MG TABS (DIAZEPAM) 1/2 to 1 by mouth as needed  #30 x 2   Entered by:   Georgette Dover   Authorized by:   Unice Cobble MD   Signed by:   Georgette Dover on 11/12/2009   Method used:   Printed then faxed to ...       [facility] [id number] (retail)       [address]       [place], [place]  [address]       Ph: [phone] or [phone]       Fax: [phone]   RxID:   [id number]   Appended Document: REFILL pt called stating rx never received by pharmacy, rx verbally called in to pharmacy

## 2010-12-12 NOTE — Assessment & Plan Note (Signed)
Summary: 4 MONTH OV//PH   Vital Signs:  Patient profile:   70 year old female Weight:      160.8 pounds Pulse rate:   80 / minute Resp:     17 per minute BP sitting:   130 / 52  (left arm) Cuff size:   large  Vitals Entered By: Georgette Dover (December 10, 2009 10:12 AM) CC: 4 month follow-up  Comments REVIEWED MED LIST, PATIENT AGREED DOSE AND INSTRUCTION CORRECT    CC:  4 month follow-up .  History of Present Illness: Labs reviewed & risks discussed. Hyperglycemic index also discussed with restricting  such carbs. No regular CVE. Smoking 1 ppd; risk discussed (2-3X increase). LDL well controlled ; TG 284, improved but not @  goal. FBS not checked; 2 hr post meal < 155.  Allergies: 1)  ! Amoxicillin 2)  ! Captopril (Captopril)  Review of Systems CV:  Denies chest pain or discomfort, leg cramps with exertion, lightheadness, near fainting, palpitations, and shortness of breath with exertion. Derm:  Denies poor wound healing. Neuro:  Denies numbness and tingling. Endo:  Denies excessive hunger, excessive thirst, and excessive urination.  Physical Exam  General:  in no acute distress; alert,appropriate and cooperative throughout examination Lungs:  Normal respiratory effort, chest expands symmetrically. Lungs are clear to auscultation, no crackles or wheezes. Heart:  normal rate, regular rhythm, no gallop, no rub, no JVD, and grade 1.5 /6 systolic murmur.   Pulses:  R and L carotid,radial,dorsalis pedis and posterior tibial pulses are full and equal bilaterally. Carotid bruits vs radiation Extremities:  No clubbing, cyanosis, edema. Good nail health Neurologic:  alert & oriented X3 and sensation intact to light touch over feet.   Skin:  Intact without suspicious lesions or rashes Psych:  memory intact for recent and remote and normally interactive.     Impression & Recommendations:  Problem # 1:  DIABETES MELLITUS, TYPE II (ICD-250.00)  Her updated medication list for this  problem includes:    Metformin Hcl 500 Mg Tabs (Metformin hcl) .Marland Kitchen... Take one tablet twice daily with two largest meals    Diovan 160 Mg Tabs (Valsartan) .Marland Kitchen... 1 once daily  Problem # 2:  UNSPECIFIED ESSENTIAL HYPERTENSION (ICD-401.9)  Her updated medication list for this problem includes:    Labetalol Hcl 300 Mg Tabs (Labetalol hcl) .Marland Kitchen... 1 by mouth bid    Spironolactone 25 Mg Tabs (Spironolactone) .Marland Kitchen... Take one tablet daily    Amlodipine Besylate 5 Mg Tabs (Amlodipine besylate) .Marland Kitchen... 1 by mouth two times a day    Diovan 160 Mg Tabs (Valsartan) .Marland Kitchen... 1 once daily  Problem # 3:  OTHER AND UNSPECIFIED HYPERLIPIDEMIA (ICD-272.4)  Her updated medication list for this problem includes:    Simvastatin 40 Mg Tabs (Simvastatin) .Marland Kitchen... Take one tablet daily  Orders: Tobacco use cessation intermediate 3-10 minutes (99406)  Problem # 4:  SMOKER (ICD-305.1)  Orders: Tobacco use cessation intermediate 3-10 minutes (99406)  Complete Medication List: 1)  Simvastatin 40 Mg Tabs (Simvastatin) .... Take one tablet daily 2)  Metformin Hcl 500 Mg Tabs (Metformin hcl) .... Take one tablet twice daily with two largest meals 3)  Valium 5 Mg Tabs (Diazepam) .... 1/2 to 1 by mouth as needed 4)  Onetouch Ultra Test Strp (Glucose blood) .... Test once daily as directed 5)  Labetalol Hcl 300 Mg Tabs (Labetalol hcl) .Marland Kitchen.. 1 by mouth bid 6)  Ester-c Tabs (Bioflavonoid products) .Marland Kitchen.. 1 tab by mouth once daily 7)  Spironolactone  25 Mg Tabs (Spironolactone) .... Take one tablet daily 8)  Allegra 180 Mg Tabs (Fexofenadine hcl) .... As needed 9)  Amlodipine Besylate 5 Mg Tabs (Amlodipine besylate) .Marland Kitchen.. 1 by mouth two times a day 10)  Tylenol Extra Strength 500 Mg Tabs (Acetaminophen) .Marland Kitchen.. 1 by mouth as needed 11)  Tylenol Arthritis Pain 650 Mg Cr-tabs (Acetaminophen) .Marland Kitchen.. 1 by mouth at bedtime 12)  Glucobetic  .Marland Kitchen.. 1 by mouth two times a day 13)  New Life Formula For Women  .Marland Kitchen.. 1 by mouth once daily 14)  Wild  Yam Cream  .... As directed 15)  Diovan 160 Mg Tabs (Valsartan) .Marland Kitchen.. 1 once daily 16)  Voltaren 1 % Gel (Diclofenac sodium) .... Apply once daily as needed (do not put anything over this) 17)  Vitamin D3 400 Unit Tabs (Cholecalciferol) .Marland Kitchen.. 1 tab by mouth once daily 18)  Vitamin B-12 100 Mcg Tabs (Cyanocobalamin) .Marland Kitchen.. 1 by mouth once daily 19)  Vitamin E 100 Unit Caps (Vitamin e) .Marland Kitchen.. 1 by mouth once daily  Other Orders: Pneumococcal Vaccine [id number]) Admin 1st Vaccine [id number])  Patient Instructions: 1)  Consume LESS THAN 25 grams of sugar/ day from LABELED foods & drinks with HFCS as #1,2 or #3 on label. 2)  Please schedule a follow-up appointment in 6 months. 3)  Stop Smoking Tips: Choose a Quit date. Cut down before the Quit date. decide what you will do as a substitute when you feel the urge to smoke(gum,toothpick,exercise). 4)  Lipid Panel prior to visit, ICD-9:272.4 5)  HbgA1C prior to visit, ICD-9:250.00   Immunizations Administered:  Pneumonia Vaccine:    Vaccine Type: Pneumovax (Medicare)    Site: right deltoid    Mfr: Merck    Dose: 0.5 ml    Route: IM    Given by: Georgette Dover    Exp. Date: 12/11/2010    Lot #: [id number]

## 2010-12-12 NOTE — Procedures (Signed)
Summary: Colonoscopy  Patient: [name] Note: All result statuses are Final unless otherwise noted.  Tests: (1) Colonoscopy (COL)   COL Colonoscopy           [facility]     [place], [place]  [address]           COLONOSCOPY PROCEDURE REPORT           PATIENT:  Sheila Melton, Sheila Melton  MR#:  [id number]     BIRTHDATE:  August 10, 1941, 47 yrs. old  GENDER:  female     ENDOSCOPIST:  Milus Banister, MD     REF. BY:  Unice Cobble, M.D.     PROCEDURE DATE:  08/02/2010     PROCEDURE:  Colonoscopy with biopsy     ASA CLASS:  Class II     INDICATIONS:  diarrhea     MEDICATIONS:   Fentanyl 75 mcg IV, Versed 2 mg IV           DESCRIPTION OF PROCEDURE:   After the risks benefits and     alternatives of the procedure were thoroughly explained, informed     consent was obtained.  Digital rectal exam was performed and     revealed no rectal masses.   The LB [id number] endoscope     was introduced through the anus and advanced to the cecum, which     was identified by both the appendix and ileocecal valve, without     limitations.  The quality of the prep was good, using MoviPrep.     The instrument was then slowly withdrawn as the colon was fully     examined.     <<PROCEDUREIMAGES>>     FINDINGS:  There were several diminutive, hyperplastic appearing     recto-sigmoid polyps. These were sampled with forceps, pathology     jar 2 (see image3).  This was otherwise a normal examination of     the colon. Colonic mucosa was randomly biopsied (pathology jar 1)     to check for microscopic colitis (see image4, image2, and image1).     Retroflexed views in the rectum revealed no abnormalities.    The     scope was then withdrawn from the patient and the procedure     completed.     COMPLICATIONS:  None           ENDOSCOPIC IMPRESSION:     1) Several small hyperplastic appearing rectosigmoid polyps,     sampled     2) Otherwise normal examination to the cecum  (terminal ileum     could not be intubateed), normal colonic mucosa was randomly     biopsied to check for microscopic colitis.           RECOMMENDATIONS:     1) Continue current colorectal screening recommendations for     "routine risk" patients with a repeat colonoscopy in 10 years.     Await final biopsy report.           ______________________________     Milus Banister, MD           n.     eSIGNED:   Milus Banister at 08/02/2010 11:21 AM           [name], [id number]  Note: An exclamation mark (!) indicates a result that was not dispersed into the flowsheet. Document Creation Date: 08/02/2010 11:22 AM _______________________________________________________________________  (  1) Order result status: Final Collection or observation date-time: 08/02/2010 11:16 Requested date-time:  Receipt date-time:  Reported date-time:  Referring Physician:   Ordering Physician: Owens Loffler [id number]) Specimen Source:  Source: Tawanna Cooler Order Number: [id number] Lab site:   Appended Document: Colonoscopy     Procedures Next Due Date:    Colonoscopy: 07/2020

## 2010-12-12 NOTE — Progress Notes (Signed)
Summary: refill request  Phone Note Refill Request Call back at [phone] Message from:  Pharmacy on July 10, 2010 8:03 AM  Refills Requested: Medication #1:  SPIRONOLACTONE 25 MG  TABS take one tablet daily   Dosage confirmed as above?Dosage Confirmed   Supply Requested: 3 months [facility]  Next Appointment Scheduled: none Initial call taken by: Osborn Coho,  July 10, 2010 8:03 AM    Prescriptions: SPIRONOLACTONE 25 MG  TABS (SPIRONOLACTONE) take one tablet daily  #90 x 2   Entered by:   [place] by:   Unice Cobble MD   Signed by:   Georgette Dover CMA on 07/10/2010   Method used:   Faxed to ...       [facility] (mail-order)             , [place]         Ph: [phone]       Fax: [phone]   RxID:   [id number]

## 2010-12-12 NOTE — Medication Information (Signed)
Summary: Possible Interaction with Amlodipine & Simvastatin/[facility]  Possible Interaction with Amlodipine & Simvastatin/[facility]   Imported By: Edmonia [name] 09/12/2010 12:20:07  _____________________________________________________________________  External Attachment:    Type:   Image     Comment:   External Document

## 2010-12-12 NOTE — Medication Information (Signed)
Summary: Interaction with Amlodipine & Simvastatin/[facility]  Interaction with Amlodipine & Simvastatin/[facility]   Imported By: Edmonia [name] 08/05/2010 09:12:04  _____________________________________________________________________  External Attachment:    Type:   Image     Comment:   External Document

## 2010-12-12 NOTE — Progress Notes (Signed)
Summary: Refill Requests  Phone Note Refill Request Message from:  Pharmacy on [place] Fax #: [phone]  Refills Requested: Medication #1:  LABETALOL HCL 300 MG TABS 1 by mouth bid   Dosage confirmed as above?Dosage Confirmed   Supply Requested: 3 months  Medication #2:  SPIRONOLACTONE 25 MG  TABS take one tablet daily   Dosage confirmed as above?Dosage Confirmed   Supply Requested: 3 months  Medication #3:  SIMVASTATIN 40 MG  TABS take one tablet daily   Dosage confirmed as above?Dosage Confirmed   Supply Requested: 3 months Next Appointment Scheduled: 8.1.11 Initial call taken by: Elna Breslow,  January 16, 2010 1:10 PM    Prescriptions: SPIRONOLACTONE 25 MG  TABS (SPIRONOLACTONE) take one tablet daily  #90 x 1   Entered by:   Georgette Dover   Authorized by:   Unice Cobble MD   Signed by:   Georgette Dover on 01/16/2010   Method used:   Faxed to ...       [facility] (mail-order)             ,          Ph: [phone]       Fax: [phone]   [id number] LABETALOL HCL 300 MG TABS (LABETALOL HCL) 1 by mouth bid  #180 Tablet x 1   Entered by:   Georgette Dover   Authorized by:   Unice Cobble MD   Signed by:   Georgette Dover on 01/16/2010   Method used:   Faxed to ...       [facility] (mail-order)             ,          Ph: [phone]       Fax: [phone]   [id number] SIMVASTATIN 40 MG  TABS (SIMVASTATIN) take one tablet daily  #90 x 1   Entered by:   Georgette Dover   Authorized by:   Unice Cobble MD   Signed by:   Georgette Dover on 01/16/2010   Method used:   Faxed to ...       [facility] (mail-order)             ,          Ph: [phone]       Fax: [phone]   RxID:   [id number]

## 2010-12-12 NOTE — Letter (Signed)
Summary: New Patient letter  [facility]  [address] [place], [place] [address]   Phone: [phone]  Fax: [phone]       06/11/2010 MRN: [id number]  [place], [place]  [address]  Dear Ms. Slayton,  Welcome to the Gastroenterology Division at [facility].    You are scheduled to see Dr. Ardis Hughs on 07/24/2010 at 2:30PM on the 3rd floor at [facility], [place] [employer].  We ask that you try to arrive at our office 15 minutes prior to your appointment time to allow for check-in.  We would like you to complete the enclosed self-administered evaluation form prior to your visit and bring it with you on the day of your appointment.  We will review it with you.  Also, please bring a complete list of all your medications or, if you prefer, bring the medication bottles and we will list them.  Please bring your insurance card so that we may make a copy of it.  If your insurance requires a referral to see a specialist, please bring your referral form from your primary care physician.  Co-payments are due at the time of your visit and may be paid by cash, check or credit card.     Your office visit will consist of a consult with your physician (includes a physical exam), any laboratory testing he/she may order, scheduling of any necessary diagnostic testing (e.g. x-ray, ultrasound, CT-scan), and scheduling of a procedure (e.g. Endoscopy, Colonoscopy) if required.  Please allow enough time on your schedule to allow for any/all of these possibilities.    If you cannot keep your appointment, please call [phone] to cancel or reschedule prior to your appointment date.  This allows [place] the opportunity to schedule an appointment for another patient in need of care.  If you do not cancel or reschedule by 5 p.m. the business day prior to your appointment date, you will be charged a $50.00 late cancellation/no-show fee.    Thank you for choosing  [facility] for your medical needs.  We appreciate the opportunity to care for you.  Please visit [place] at our website  to learn more about our practice.                     Sincerely,                                                             The Gastroenterology Division

## 2010-12-12 NOTE — Progress Notes (Signed)
Summary: med not available  Phone Note Refill Request Message from:  Fax from Pharmacy on September 11, 2010 10:11 AM  Refills Requested: Medication #1:  LABETALOL HCL 200 MG TABS 1 1/2 by mouth two times a day [facility] fax [phone] - Labetalol tab 200 mg is temporarily unavailable with all generic manufacturers.  Brand normodyne 100mg  is discontinued & trandate 200mg  is unavailable.  Labetalol 100 mg is unavailable in sufficient quantities to use as an alternative.  Possible alternatives include Carvedilil(comes 3.125, 6.25, 12.5 & 25mg )or Pindolol (comes 5 and 10 mg.)    Initial call taken by: Arbie [place],  September 11, 2010 10:25 AM  Follow-up for Phone Call        Dr.Hopper please advise Follow-up by: Georgette Dover CMA,  September 11, 2010 10:34 AM  Additional Follow-up for Phone Call Additional follow up Details #1::        Carvedilol 6.25 mg two times a day #60; OV with BP readings after 2 weeks. Goal = BP average < 135/85(this will cost $4 for 60 pills @ [facility] or [facility] if insurance NOT used. Once we establish appropriate dose we can do mail order ) Additional Follow-up by: Unice Cobble MD,  September 11, 2010 12:54 PM    Additional Follow-up for Phone Call Additional follow up Details #2::    Faxed temporary prescription to local pharmacy and another prescription to [facility] to make sure patient gets her meds. Left message on machine to call back to office Ernestene Mention CMA  September 11, 2010 2:32 PM   Patient notified. Ernestene Mention CMA  September 12, 2010 8:54 AM   New/Updated Medications: CARVEDILOL 6.25 MG TABS (CARVEDILOL) 1 by mouth bid Prescriptions: CARVEDILOL 6.25 MG TABS (CARVEDILOL) 1 by mouth bid  #90 x 0   Entered by:   Ernestene Mention CMA   Authorized by:   Unice Cobble MD   Signed by:   Ernestene Mention CMA on 09/11/2010   Method used:   Faxed to ...       [facility] (mail-order)             , [place]         Ph: [phone]       Fax: [phone]   RxID:    [id number] CARVEDILOL 6.25 MG TABS (CARVEDILOL) 1 by mouth bid  #60 x 0   Entered by:   Ernestene Mention CMA   Authorized by:   Unice Cobble MD   Signed by:   Ernestene Mention CMA on 09/11/2010   Method used:   Electronically to        [facility] (retail)       [address]       [place], [place]  [address]       Ph: [phone] or [phone]       Fax: [phone]   RxID:   [id number]

## 2010-12-12 NOTE — Assessment & Plan Note (Signed)
Summary: SINUS INFECTION/BRON//PH   Vital Signs:  Patient profile:   70 year old female Weight:      156.4 pounds BMI:     26.12 Temp:     98.2 degrees F oral Pulse rate:   72 / minute Resp:     17 per minute BP sitting:   140 / 50  (right arm) Cuff size:   large  Vitals Entered By: Georgette Dover CMA (September 04, 2010 11:36 AM) CC: Sinus Infection x 1 week, URI symptoms   Primary Care Provider:  Unice Cobble, MD   CC:  Sinus Infection x 1 week and URI symptoms.  History of Present Illness: RTI  Symptoms      This is a 70 year old woman who presents with RTI symptoms X 1 week; onset was "throat tickle & nasal congestion".  The patient reports nasal congestion, dry cough, and earache, but denies purulent nasal discharge and sore throat.  Associated symptoms include wheezing.  The patient denies fever, dyspnea, and vomiting ( some nausea).  The patient also reports intermittent ,diffuse headache.  The patient denies the following risk factors for Strep sinusitis: unilateral nasal discharge and tooth pain.   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  Current Medications (verified): 1)  Simvastatin 40 Mg  Tabs (Simvastatin) .... Take One Tablet Daily 2)  Metformin Hcl 500 Mg  Tabs (Metformin Hcl) .Marland Kitchen.. 1 By Mouth Twice Daily With Two Largest Meals **lab Appointment Due** 3)  Valium 5 Mg Tabs (Diazepam) .... 1/2 To 1 By Mouth As Needed 4)  Onetouch Ultra Test  Strp (Glucose Blood) .... Test Once Daily As Directed 5)  Labetalol Hcl 200 Mg Tabs (Labetalol Hcl) .Marland Kitchen.. 1 1/2 By Mouth Two Times A Day 6)  Ester-C  Tabs (Bioflavonoid Products) .Marland Kitchen.. 1 Tab By Mouth Once Daily 7)  Spironolactone 25 Mg  Tabs (Spironolactone) .... Take One Tablet Daily 8)  Allegra 180 Mg  Tabs (Fexofenadine Hcl) .... As Needed 9)  Tylenol Extra Strength 500 Mg Tabs (Acetaminophen) .Marland Kitchen.. 1 By Mouth As Needed 10)  Tylenol  Arthritis Pain 650 Mg Cr-Tabs (Acetaminophen) .Marland Kitchen.. 1 By Mouth At Bedtime 11)  Glucobetic .Marland Kitchen.. 1 By Mouth Two Times A Day 12)  New Life Formula For Women .Marland Kitchen.. 1 By Mouth Once Daily 13)  Wild Yam Cream .... As Directed 14)  Diovan 160 Mg Tabs (Valsartan) .Marland Kitchen.. 1 Once Daily 15)  Vitamin D3 400 Unit Tabs (Cholecalciferol) .Marland Kitchen.. 1 Tab By Mouth Once Daily 16)  Vitamin B-12 100 Mcg Tabs (Cyanocobalamin) .Marland Kitchen.. 1 By Mouth Once Daily 17)  Vitamin E 100 Unit Caps (Vitamin E) .Marland Kitchen.. 1 By Mouth Once Daily 18)  Prednisone 5 Mg  Tabs (Prednisone) .... Take As Directed  Allergies: 1)  ! Amoxicillin 2)  ! Captopril (Captopril)  Physical Exam  General:  Surprisingly in no acute distress; alert,appropriate and cooperative throughout examination Ears:  External ear exam shows no significant lesions or deformities.  Otoscopic examination reveals  wax bilaterally. Nose:  External nasal examination shows no deformity or inflammation. Nasal mucosa are pink and moist without lesions or exudates. Mouth:  Oral mucosa and oropharynx without lesions or exudates.  Hoarse Lungs:  Normal respiratory effort, chest expands symmetrically. Lungs : diffuse , homogenous wet rhonchi & wheezes Heart:  normal rate, regular rhythm, no gallop, no rub, no JVD, and grade  1/6 systolic murmur.   Extremities:  No clubbing, cyanosis, edema. Skin:  Intact without suspicious lesions or rashes Cervical Nodes:  No lymphadenopathy noted Axillary Nodes:  No palpable lymphadenopathy   Impression & Recommendations:  Problem # 1:  BRONCHITIS-ACUTE (ICD-466.0)  Orders: Venipuncture [id number]) TLB-CBC Platelet - w/Differential (85025-CBCD) T-2 View CXR (71020TC)  Her updated medication list for this problem includes:    Symbicort 160-4.5 Mcg/act Aero (Budesonide-formoterol fumarate) .Marland Kitchen... 1-2 puffs every 12 hrs ; gargle & spit after use    Doxycycline Hyclate 100 Mg Caps (Doxycycline hyclate) .Marland Kitchen... 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 As needed    Tylenol Extra Strength 500 Mg Tabs (Acetaminophen) .Marland Kitchen... 1 by mouth as needed    Tylenol Arthritis Pain 650 Mg Cr-tabs (Acetaminophen) .Marland Kitchen... 1 by mouth at bedtime  Orders: Venipuncture [id number]) TLB-CBC Platelet - w/Differential (85025-CBCD)  Complete Medication List: 1)  Simvastatin 40 Mg Tabs (Simvastatin) .... Take one tablet daily 2)  Metformin Hcl 500 Mg Tabs (Metformin hcl) .Marland Kitchen.. 1 by mouth twice daily with two largest meals **lab appointment due** 3)  Valium 5 Mg Tabs (Diazepam) .... 1/2 to 1 by mouth as needed 4)  Onetouch Ultra Test Strp (Glucose blood) .... Test once daily as directed 5)  Labetalol Hcl 200 Mg Tabs (Labetalol hcl) .Marland Kitchen.. 1 1/2 by mouth two times a day 6)  Ester-c Tabs (Bioflavonoid products) .Marland Kitchen.. 1 tab by mouth once daily 7)  Spironolactone 25 Mg Tabs (Spironolactone) .... Take one tablet daily 8)  Allegra 180 Mg Tabs (Fexofenadine hcl) .... As needed 9)  Tylenol Extra Strength 500 Mg Tabs (Acetaminophen) .Marland Kitchen.. 1 by mouth as needed 10)  Tylenol Arthritis Pain 650 Mg Cr-tabs (Acetaminophen) .Marland Kitchen.. 1 by mouth at bedtime 11)  Glucobetic  .Marland Kitchen.. 1 by mouth two times a day 12)  New Life Formula For Women  .Marland Kitchen.. 1 by mouth once daily 13)  Wild Yam Cream  .... As directed 14)  Diovan 160 Mg Tabs (Valsartan) .Marland Kitchen.. 1 once daily 15)  Vitamin D3 400 Unit Tabs (Cholecalciferol) .Marland Kitchen.. 1 tab by mouth once daily 16)  Vitamin B-12 100 Mcg Tabs (Cyanocobalamin) .Marland Kitchen.. 1 by mouth once daily 17)  Vitamin E 100 Unit Caps (Vitamin e) .Marland Kitchen.. 1 by mouth once daily 18)  Prednisone 5 Mg Tabs (Prednisone) .... Take as directed 19)   Symbicort 160-4.5 Mcg/act Aero (Budesonide-formoterol fumarate) .Marland Kitchen.. 1-2 puffs every 12 hrs ; gargle & spit after use 20)  Prednisone 20 Mg Tabs (Prednisone) .Marland Kitchen.. 1 two times a day with a meal ( monitor glucose) 21)  Doxycycline Hyclate 100 Mg Caps (Doxycycline hyclate) .Marland Kitchen.. 1 two times a day x 5 days , then once daily  Patient Instructions: 1)  Avoid smoking as discussed. 2)  Drink as much  NON dairy fluid as you can tolerate for the next few days. 3)  Check your blood sugars regularly. If your readings are usually above : 150 or below 90 you should contact our office. Prescriptions: DOXYCYCLINE HYCLATE 100 MG CAPS (DOXYCYCLINE HYCLATE) 1 two times a day X 5 days , then once daily  #15 x 0   Entered and Authorized by:   Unice Cobble MD  Signed by:   Unice Cobble MD on 09/04/2010   Method used:   Faxed to ...       [facility] (retail)       [address]       [place], [place]  [address]       Ph: [phone] or [phone]       Fax: [phone]   RxID:   [id number] PREDNISONE 20 MG TABS (PREDNISONE) 1 two times a day with a meal ( monitor glucose)  #14 x 0   Entered and Authorized by:   Unice Cobble MD   Signed by:   Unice Cobble MD on 09/04/2010   Method used:   Faxed to ...       [facility] (retail)       [address]       [place], [place]  [address]       Ph: [phone] or [phone]       Fax: [phone]   RxID:   [id number] SYMBICORT 160-4.5 MCG/ACT AERO (BUDESONIDE-FORMOTEROL FUMARATE) 1-2 puffs every 12 hrs ; gargle & spit after use  #1 x 0   Entered and Authorized by:   Unice Cobble MD   Signed by:   Unice Cobble MD on 09/04/2010   Method used:   Samples Given   RxID:   [id number]    Orders Added: 1)  Est. Patient Level IV [id number] 2)  Venipuncture [id number] 3)  TLB-CBC Platelet - w/Differential [85025-CBCD] 4)  T-2 View CXR [71020TC]

## 2010-12-12 NOTE — Progress Notes (Signed)
Summary: Med Concerns  Phone Note Outgoing Call Call back at Home Phone [phone]   Call placed by: Georgette Dover CMA,  July 23, 2010 9:53 AM Call placed to: Patient Summary of Call: Per infor from [place] on June 8,2001 The FDA issued a safety communication for Zocor (simvastatin should not exceed 20mg  daily when taken with amlodipine to avoid risk of myopathy and rhabdmyolysis.)  Per Dr.Hopper have patient STOP amlodipine and monitor B/P goal average <135/85, If B/P > than this over 6 weeks please check labs (BUN,Creat,K 401.9) and see Dr 2-3 days later.  Patient aware of all information and ok'd, copy of paper sent to be scanned and copy mailed to patient.      [place]  July 23, 2010 9:59 AM

## 2010-12-12 NOTE — Assessment & Plan Note (Signed)
Review of gastrointestinal problems: 1. 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  She was having chronic diarrhea.  October 2011: very good response Entocort, changing to prednisone do to extreme expense. 2. routine risk for colon cancer, hyperplastic polyp removed September 2011. Next colonoscopy at 10 year interval for routine screening.     History of Present Illness Visit Type: Follow-up Visit Primary GI MD: Owens Loffler MD Primary Provider: Unice Cobble, MD  Requesting Provider: Unice Cobble, MD  Chief Complaint: 4 week f/u History of Present Illness:     very pleasant 70 year old woman whom I last sawAt the time of a colonoscopy about one month ago. See those results above. Since then she has been taking Entocort 3 pills once daily. She is almost out of the medicine. She tells me to refill it would cost her about thousand dollars out of pocket because she is to be entering the doughnut hole soon. Fortunately she has had a very good response to Entocort, nearly immediately after starting taking it. She has no more loose stools.           Current Medications (verified): 1)  Simvastatin 40 Mg  Tabs (Simvastatin) .... Take One Tablet Daily 2)  Metformin Hcl 500 Mg  Tabs (Metformin Hcl) .Marland Kitchen.. 1 By Mouth Twice Daily With Two Largest Meals **lab Appointment Due** 3)  Valium 5 Mg Tabs (Diazepam) .... 1/2 To 1 By Mouth As Needed 4)  Onetouch Ultra Test  Strp (Glucose Blood) .... Test Once Daily As Directed 5)  Labetalol Hcl 200 Mg Tabs (Labetalol Hcl) .Marland Kitchen.. 1 1/2 By Mouth Two Times A Day 6)  Ester-C  Tabs (Bioflavonoid Products) .Marland Kitchen.. 1 Tab By Mouth Once Daily 7)  Spironolactone 25 Mg  Tabs (Spironolactone) .... Take One Tablet Daily 8)  Allegra 180 Mg  Tabs (Fexofenadine Hcl) .... As Needed 9)  Tylenol Extra Strength 500 Mg Tabs (Acetaminophen) .Marland Kitchen.. 1 By Mouth As Needed 10)  Tylenol Arthritis Pain 650 Mg Cr-Tabs (Acetaminophen) .Marland Kitchen.. 1 By Mouth At  Bedtime 11)  Glucobetic .Marland Kitchen.. 1 By Mouth Two Times A Day 12)  New Life Formula For Women .Marland Kitchen.. 1 By Mouth Once Daily 13)  Wild Yam Cream .... As Directed 14)  Diovan 160 Mg Tabs (Valsartan) .Marland Kitchen.. 1 Once Daily 15)  Vitamin D3 400 Unit Tabs (Cholecalciferol) .Marland Kitchen.. 1 Tab By Mouth Once Daily 16)  Vitamin B-12 100 Mcg Tabs (Cyanocobalamin) .Marland Kitchen.. 1 By Mouth Once Daily 17)  Vitamin E 100 Unit Caps (Vitamin E) .Marland Kitchen.. 1 By Mouth Once Daily 18)  Budesonide 3 Mg Xr24h-Cap (Budesonide) .... 3 Pills By Mouth Q Am  Allergies (verified): 1)  ! Amoxicillin 2)  ! Captopril (Captopril)  Vital Signs:  Patient profile:   70 year old female Height:      65 inches Weight:      156 pounds BMI:     26.05 Pulse rate:   86 / minute BP sitting:   112 / 50  (right arm)  Vitals Entered By: Christian Mate CMA Deborra Medina) (September 04, 2010 10:11 AM)  Physical Exam  Additional Exam:  Constitutional: generally well appearing Psychiatric: alert and oriented times 3 Abdomen: soft, non-tender, non-distended, normal bowel sounds    Impression & Recommendations:  Problem # 1:  lymphocytic colitis we will change her over to prednisone do to cost issues. She does understand that this may cause her blood sugars to be a bit higher than usual and she will check them more frequently. I will  for disorder Dr. Linna Darner to let him be aware. Hopefully we'll be able to get her off of all steroids within the next several weeks, see her tapering schedule outlined below. She will return to see me in 8-10 weeks and sooner if needed.  Patient Instructions: 1)  Stop budesonide. 2)  Start prednisone 5mg  pills, take 2 pills a day for 1 weeek, then one pill a day for 1 month, then 1 every other day for a month. 3)  Return to see Dr. Ardis Hughs in 8-10 weeks. 4)  A copy of this information will be sent to Dr. Linna Darner. 5)  The medication list was reviewed and reconciled.  All changed / newly prescribed medications were explained.  A complete medication  list was provided to the patient / caregiver. Prescriptions: PREDNISONE 5 MG  TABS (PREDNISONE) take as directed  #100 x 2   Entered and Authorized by:   Milus Banister MD   Signed by:   Milus Banister MD on 09/04/2010   Method used:   Electronically to        [facility] [phone]* (retail)       [address]       [place], [place]  [address]       Ph: [phone] or [phone]       Fax: [phone]   RxID:   [id number]

## 2010-12-12 NOTE — Procedures (Signed)
Summary: Colonoscopy  Patient: [name] Note: All result statuses are Final unless otherwise noted.  Tests: (1) Colonoscopy (COL)   COL Colonoscopy           DONE (C)     [facility]     [place], [place]  [id number]           COLONOSCOPY PROCEDURE REPORT           PATIENT:  Sheila Melton, Sheila Melton  MR#:  [id number]     BIRTHDATE:  01-23-1941, 78 yrs. old  GENDER:  female     ENDOSCOPIST:  Milus Banister, MD     REF. BY:  Unice Cobble, M.D.     PROCEDURE DATE:  08/02/2010     PROCEDURE:  Colonoscopy with biopsy     ASA CLASS:  Class II     INDICATIONS:  diarrhea     MEDICATIONS:   Fentanyl 75 mcg IV, Versed 8 mg IV           DESCRIPTION OF PROCEDURE:   After the risks benefits and     alternatives of the procedure were thoroughly explained, informed     consent was obtained.  Digital rectal exam was performed and     revealed no rectal masses.   The LB [id number] endoscope     was introduced through the anus and advanced to the cecum, which     was identified by both the appendix and ileocecal valve, without     limitations.  The quality of the prep was good, using MoviPrep.     The instrument was then slowly withdrawn as the colon was fully     examined.     <<PROCEDUREIMAGES>>     FINDINGS:  There were several diminutive, hyperplastic appearing     recto-sigmoid polyps. These were sampled with forceps, pathology     jar 2 (see image3).  This was otherwise a normal examination of     the colon. Colonic mucosa was randomly biopsied (pathology jar 1)     to check for microscopic colitis (see image4, image2, and image1).     Retroflexed views in the rectum revealed no abnormalities.    The     scope was then withdrawn from the patient and the procedure     completed.     COMPLICATIONS:  None           ENDOSCOPIC IMPRESSION:     1) Several small hyperplastic appearing rectosigmoid polyps,     sampled     2) Otherwise normal examination to the cecum  (terminal ileum     could not be intubateed), normal colonic mucosa was randomly     biopsied to check for microscopic colitis.           RECOMMENDATIONS:     1) Continue current colorectal screening recommendations for     "routine risk" patients with a repeat colonoscopy in 10 years.     Await final biopsy report.           ______________________________     Milus Banister, MD           n.     REVISED:  08/13/2010 01:11 PM     eSIGNED:   Milus Banister at 08/13/2010 01:11 PM           [name], [id number]  Note: An exclamation mark (!) indicates a result that was not dispersed  into the flowsheet. Document Creation Date: 08/13/2010 1:12 PM _______________________________________________________________________  (1) Order result status: Final Collection or observation date-time: 08/02/2010 11:16 Requested date-time:  Receipt date-time:  Reported date-time:  Referring Physician:   Ordering Physician: Owens Loffler [phone]) Specimen Source:  Source: Tawanna Cooler Order Number: [id number] Lab site:

## 2010-12-12 NOTE — Progress Notes (Signed)
Summary: Labetalol Concerns  Phone Note From Pharmacy   Summary of Call: [facility] faxed over paper stating Labetalol 300mg  in temporarily unavaliable, a possible substitue is 200mg  tab (scored tab)  Per Dr.Hopper ok to fill Labetalol 200mg  1 1/2 by mouth two times a day #270/2 refills  **Sent paper fax back to pharmacy with change indicated and Dr.Hopper's sig**    New/Updated Medications: LABETALOL HCL 200 MG TABS (LABETALOL HCL) 1 1/2 by mouth two times a day Prescriptions: LABETALOL HCL 200 MG TABS (LABETALOL HCL) 1 1/2 by mouth two times a day  #270 x 2   Entered by:   [place] by:   Unice Cobble MD   Signed by:   Georgette Dover CMA on 07/05/2010   Method used:   Historical   [id number]

## 2010-12-12 NOTE — Progress Notes (Signed)
Summary: Refill--Diovan  Phone Note Refill Request Message from:  Patient on November 22, 2010 10:30 AM  Refills Requested: Medication #1:  DIOVAN 160 MG TABS 1 once daily Initial call taken by: Ernestene Mention CMA,  November 22, 2010 10:30 AM    Prescriptions: DIOVAN 160 MG TABS (VALSARTAN) 1 once daily  #90 x 0   Entered by:   Ernestene Mention CMA   Authorized by:   Unice Cobble MD   Signed by:   Ernestene Mention CMA on 11/22/2010   Method used:   Faxed to ...       [facility] (mail-order)             , [place]         Ph: [phone]       Fax: [phone]   RxID:   [id number]

## 2010-12-12 NOTE — Letter (Signed)
Summary: [facility] Instructions  [facility]  [place], [place] [address]   Phone: [phone]  Fax: [phone]       NEKODA LEHANE    02/23/1941    MRN: [id number]        Procedure Day /Date:08/02/10  FRI     Arrival Time:10 am     Procedure Time:11 am     Location of Procedure:                    X  [facility] (4th Floor)                        [place]   Starting 5 days prior to your procedure 07/28/10  do not eat nuts, seeds, popcorn, corn, beans, peas,  salads, or any raw vegetables.  Do not take any fiber supplements (e.g. Metamucil, Citrucel, and Benefiber).  THE DAY BEFORE YOUR PROCEDURE         DATE: 08/01/10  DAY: THURS  1.  Drink clear liquids the entire day-NO SOLID FOOD  2.  Do not drink anything colored red or purple.  Avoid juices with pulp.  No orange juice.  3.  Drink at least 64 oz. (8 glasses) of fluid/clear liquids during the day to prevent dehydration and help the prep work efficiently.  CLEAR LIQUIDS INCLUDE: Water Jello Ice Popsicles Tea (sugar ok, no milk/cream) Powdered fruit flavored drinks Coffee (sugar ok, no milk/cream) Gatorade Juice: apple, white grape, white cranberry  Lemonade Clear bullion, consomm, broth Carbonated beverages (any kind) Strained chicken noodle soup Hard Candy                             4.  In the morning, mix first dose of MoviPrep solution:    Empty 1 Pouch A and 1 Pouch B into the disposable container    Add lukewarm drinking water to the top line of the container. Mix to dissolve    Refrigerate (mixed solution should be used within 24 hrs)  5.  Begin drinking the prep at 5:00 p.m. The MoviPrep container is divided by 4 marks.   Every 15 minutes drink the solution down to the next mark (approximately 8 oz) until the full liter is complete.   6.  Follow completed prep with 16 oz of clear liquid of your choice (Nothing red or purple).   Continue to drink clear liquids until bedtime.  7.  Before going to bed, mix second dose of MoviPrep solution:    Empty 1 Pouch A and 1 Pouch B into the disposable container    Add lukewarm drinking water to the top line of the container. Mix to dissolve    Refrigerate  THE DAY OF YOUR PROCEDURE      DATE: 08/02/10 DAY: Ludwig Clarks  Beginning at 6 a.m. (5 hours before procedure):         1. Every 15 minutes, drink the solution down to the next mark (approx 8 oz) until the full liter is complete.  2. Follow completed prep with 16 oz. of clear liquid of your choice.    3. You may drink clear liquids until 9 am (2 HOURS BEFORE PROCEDURE).   MEDICATION INSTRUCTIONS  Unless otherwise instructed, you should take regular prescription medications with a small sip of water   as early as possible the morning of your procedure.  Diabetic patients - see separate instructions.           OTHER INSTRUCTIONS  You will need a responsible adult at least 68 years of age to accompany you and drive you home.   This person must remain in the waiting room during your procedure.  Wear loose fitting clothing that is easily removed.  Leave jewelry and other valuables at home.  However, you may wish to bring a book to read or  an iPod/MP3 player to listen to music as you wait for your procedure to start.  Remove all body piercing jewelry and leave at home.  Total time from sign-in until discharge is approximately 2-3 hours.  You should go home directly after your procedure and rest.  You can resume normal activities the  day after your procedure.  The day of your procedure you should not:   Drive   Make legal decisions   Operate machinery   Drink alcohol   Return to work  You will receive specific instructions about eating, activities and medications before you leave.    The above instructions have been reviewed and explained to me by   _______________________    I fully understand  and can verbalize these instructions _____________________________ Date _________

## 2010-12-12 NOTE — Miscellaneous (Signed)
Summary: Orders Update   Clinical Lists Changes  Orders: Added new Referral order of Gastroenterology Referral (GI) - Signed Added new Referral order of Radiology Referral (Radiology) - Signed

## 2010-12-12 NOTE — Progress Notes (Signed)
Summary: Refill Request   Phone Note Refill Request Message from:  Pharmacy on October 07, 2010 10:24 AM  Refills Requested: Medication #1:  DIOVAN 160 MG TABS 1 once daily   Dosage confirmed as above?Dosage Confirmed   Supply Requested: 1 month   Last Refilled: 04/05/2010 Just a 30 day rx to [facility]  Next Appointment Scheduled: none Initial call taken by: Elna Breslow,  October 07, 2010 10:24 AM  Follow-up for Phone Call        I called patient to clarify, [facility] is a mail order company and usually take a 90 day rx vs 30 day rx. Follow-up by: Georgette Dover CMA,  October 07, 2010 4:52 PM  Additional Follow-up for Phone Call Additional follow up Details #1::        patient returned call - patient states she talked to [facility] & they will fill a 30 day supply [place]  .Marland KitchenArbie [place]  October 08, 2010 8:24 AM     Prescriptions: DIOVAN 160 MG TABS (VALSARTAN) 1 once daily  #30 x 0   Entered by:   Georgette Dover CMA   Authorized by:   Unice Cobble MD   Signed by:   Georgette Dover CMA on 10/08/2010   Method used:   Faxed to ...       [facility] (mail-order)             , [place]         Ph: [phone]       Fax: [phone]   RxID:   [id number]

## 2010-12-12 NOTE — Assessment & Plan Note (Signed)
Summary: FOR MED CHANGE //PH   Vital Signs:  Patient profile:   70 year old female Height:      65 inches (165.10 cm) Weight:      156.50 pounds (71.14 kg) BMI:     26.14 Temp:     98.2 degrees F (36.78 degrees C) oral Resp:     17 per minute BP sitting:   140 / 72  (right arm) Cuff size:   regular  Vitals Entered By: Ernestene Mention CMA (October 11, 2010 11:17 AM) CC: Discuss possible med changes./kb Is Patient Diabetic? Yes Pain Assessment Patient in pain? no        Primary Care Provider:  Unice Cobble, MD   CC:  Discuss possible med changes./kb.  History of Present Illness: Hyperlipidemia Follow-Up      This is a 70 year old woman who presents for Hyperlipidemia follow-up.  The patient reports muscle aches in calves( nocturnally) & buttocks9with walking, not @ rest), but denies GI upset, abdominal pain, flushing, itching, constipation, diarrhea, and fatigue.  The patient denies the following symptoms: chest pain/pressure, dypsnea, palpitations, syncope, and pedal edema.  Compliance with medications (by patient report) has been near 100%.  Dietary compliance has been good.  The patient reports no exercise.    Current Medications (verified): 1)  Simvastatin 40 Mg  Tabs (Simvastatin) .... Take One Tablet Daily 2)  Metformin Hcl 500 Mg  Tabs (Metformin Hcl) .Marland Kitchen.. 1 By Mouth Twice Daily With Two Largest Meals **lab Appointment Due** 3)  Valium 5 Mg Tabs (Diazepam) .... 1/2 To 1 By Mouth As Needed 4)  Onetouch Ultra Test  Strp (Glucose Blood) .... Test Once Daily As Directed 5)  Labetalol Hcl 300 Mg Tabs (Labetalol Hcl) .Marland Kitchen.. 1 By Mouth Bid 6)  Ester-C  Tabs (Bioflavonoid Products) .Marland Kitchen.. 1 Tab By Mouth Once Daily 7)  Spironolactone 25 Mg  Tabs (Spironolactone) .... Take One Tablet Daily 8)  Allegra 180 Mg  Tabs (Fexofenadine Hcl) .... As Needed 9)  Tylenol Extra Strength 500 Mg Tabs (Acetaminophen) .Marland Kitchen.. 1 By Mouth As Needed 10)  Tylenol Arthritis Pain 650 Mg Cr-Tabs (Acetaminophen)  .Marland Kitchen.. 1 By Mouth At Bedtime 11)  Glucobetic .Marland Kitchen.. 1 By Mouth Two Times A Day 12)  New Life Formula For Women .Marland Kitchen.. 1 By Mouth Once Daily 13)  Wild Yam Cream .... As Directed 14)  Diovan 160 Mg Tabs (Valsartan) .Marland Kitchen.. 1 Once Daily 15)  Vitamin D3 400 Unit Tabs (Cholecalciferol) .Marland Kitchen.. 1 Tab By Mouth Once Daily 16)  Vitamin B-12 100 Mcg Tabs (Cyanocobalamin) .Marland Kitchen.. 1 By Mouth Once Daily 17)  Vitamin E 100 Unit Caps (Vitamin E) .Marland Kitchen.. 1 By Mouth Once Daily 18)  Prednisone 5 Mg  Tabs (Prednisone) .... Take As Directed  Allergies (verified): 1)  ! Amoxicillin 2)  ! Captopril (Captopril) 3)  ! Simvastatin (Simvastatin)  Physical Exam  General:  in no acute distress; alert,appropriate and cooperative throughout examination Lungs:  Normal respiratory effort, chest expands symmetrically. Lungs are clear to auscultation, no crackles or wheezes. Heart:  normal rate, regular rhythm, no gallop, no rub, no JVD, no HJR, and grade 1 /6 systolic murmur.   Pulses:  R and L carotid,radial,femoral,dorsalis pedis and posterior tibial pulses are full and equal bilaterally. L carotid bruit Extremities:  Minor clubbing; no  cyanosis, edema. Neg Homan's. L wrist ganglion Skin:  Intact without suspicious lesions or rashes Psych:  memory intact for recent and remote, normally interactive, and good eye contact.  Impression & Recommendations:  Problem # 1:  MUSCLE PAIN (ICD-729.1)  Buttocks cramping with walking. ? Claudication variant. Nocturnal calf cramping Her updated medication list for this problem includes:    Tylenol Extra Strength 500 Mg Tabs (Acetaminophen) .Marland Kitchen... 1 by mouth as needed    Tylenol Arthritis Pain 650 Mg Cr-tabs (Acetaminophen) .Marland Kitchen... 1 by mouth at bedtime  Orders: Venipuncture [id number]) TLB-CK Total Only(Creatine Kinase/CPK) (82550-CK) LE Arterial Doppler/ABI (Le arterial doppler)  Problem # 2:  UNSPECIFIED ESSENTIAL HYPERTENSION (ICD-401.9)  Her updated medication list for this problem  includes:    Labetalol Hcl 300 Mg Tabs (Labetalol hcl) .Marland Kitchen... 1 by mouth bid    Spironolactone 25 Mg Tabs (Spironolactone) .Marland Kitchen... Take one tablet daily    Diovan 160 Mg Tabs (Valsartan) .Marland Kitchen... 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 Take one tablet daily  Problem # 5:  NONDEPENDENT TOBACCO USE DISORDER (ICD-305.1)  Orders: LE Arterial Doppler/ABI (Le arterial doppler)  Complete Medication List: 1)  Simvastatin 40 Mg Tabs (Simvastatin) .... Take one tablet daily 2)  Metformin Hcl 500 Mg Tabs (Metformin hcl) .Marland Kitchen.. 1 by mouth twice daily with two largest meals **lab appointment due** 3)  Valium 5 Mg Tabs (Diazepam) .... 1/2 to 1 by mouth as needed 4)  Onetouch Ultra Test Strp (Glucose blood) .... Test once daily as directed 5)  Labetalol Hcl 300 Mg Tabs (Labetalol hcl) .Marland Kitchen.. 1 by mouth bid 6)  Ester-c Tabs (Bioflavonoid products) .Marland Kitchen.. 1 tab by mouth once daily 7)  Spironolactone 25 Mg Tabs (Spironolactone) .... Take one tablet daily 8)  Allegra 180 Mg Tabs (Fexofenadine hcl) .... As needed 9)  Tylenol Extra Strength 500 Mg Tabs (Acetaminophen) .Marland Kitchen.. 1 by mouth as needed 10)  Tylenol Arthritis Pain 650 Mg Cr-tabs (Acetaminophen) .Marland Kitchen.. 1 by mouth at bedtime 11)  Glucobetic  .Marland Kitchen.. 1 by mouth two times a day 12)  New Life Formula For Women  .Marland Kitchen.. 1 by mouth once daily 13)  Wild Yam Cream  .... As directed 14)  Diovan 160 Mg Tabs (Valsartan) .Marland Kitchen.. 1 once daily 15)  Vitamin D3 400 Unit Tabs (Cholecalciferol) .Marland Kitchen.. 1 tab by mouth once daily 16)  Vitamin B-12 100 Mcg Tabs (Cyanocobalamin) .Marland Kitchen.. 1 by mouth once daily 17)  Vitamin E 100 Unit Caps (Vitamin e) .Marland Kitchen.. 1 by mouth once daily 18)  Prednisone 5 Mg Tabs (Prednisone) .... Take as directed  Patient  Instructions: 1)  Hold Simvastatin until seen by Dr Stanford Breed.   Orders Added: 1)  Est. Patient Level III [id number] 2)  Venipuncture [id number] 3)  TLB-CK Total Only(Creatine Kinase/CPK) [82550-CK] 4)  LE Arterial Doppler/ABI [Le arterial doppler]  Appended Document: FOR MED CHANGE //PH     Clinical Lists Changes  Orders: Added new Service order of Flu Vaccine 63yrs + MEDICARE PATIENTS [id number]) - Signed Added new Service order of Administration Flu vaccine - MCR [id number]) - Signed Observations: Added new observation of FLU VAX VIS: 06/04/2010 version (10/11/2010 11:39) Added new observation of FLU VAXLOT: [id number] (10/11/2010 11:39) Added new observation of FLU VAXMFR: [employer] (10/11/2010 11:39) Added new observation of FLU VAX EXP: 05/10/2011 (10/11/2010 11:39) Added new observation of FLU VAX DSE: 0.65ml (10/11/2010 11:39) Added new observation of FLU VAX: Fluvax 3+ (10/11/2010 11:39)  Flu Vaccine Consent Questions     Do you have a history of severe allergic reactions to this vaccine? no    Any prior history of allergic reactions to egg and/or gelatin? no    Do you have a sensitivity to the preservative Thimersol? no    Do you have a past history of Guillan-Barre Syndrome? no    Do you currently have an acute febrile illness? no    Have you ever had a severe reaction to latex? no    Vaccine information given and explained to patient? yes    Are you currently pregnant? no    Lot Number:[id number]   Exp Date:05/10/2011   Site Given  Left Deltoid IMedflu  Appended Document: FOR MED CHANGE //PH

## 2010-12-12 NOTE — Letter (Signed)
Summary: [facility]   Imported By: Edmonia [name] 08/28/2010 08:45:16  _____________________________________________________________________  External Attachment:    Type:   Image     Comment:   External Document

## 2010-12-12 NOTE — Progress Notes (Signed)
Summary: [facility] rx  Phone Note Call from Patient   Summary of Call: Patient called [facility] to discuss her rx not being available, they made her aware that they do have  Labetalol 300mg  in stock and she would like that prescription. This is what the patient was on originally. Why [facility] did not made [place] aware previously (previous phone note) I cannot be sure. RX sent per patient request. Initial call taken by: Ernestene Mention CMA,  September 12, 2010 10:30 AM    New/Updated Medications: LABETALOL HCL 300 MG TABS (LABETALOL HCL) 1 by mouth qd Prescriptions: LABETALOL HCL 300 MG TABS (LABETALOL HCL) 1 by mouth qd  #90 x 1   Entered by:   Ernestene Mention CMA   Authorized by:   Unice Cobble MD   Signed by:   Ernestene Mention CMA on 09/12/2010   Method used:   Faxed to ...       [facility] (mail-order)             , [place]         Ph: [phone]       Fax: [phone]   RxID:   [id number]   Appended Document: [facility] called to confirm prescription itself and the decrease in dosage. This was sent per patient request b/c she did not want to take Carvedilol and her previous dosage of Labetalol was too much. They will fill prescription and ship to patient.

## 2010-12-12 NOTE — Assessment & Plan Note (Signed)
Summary: 6 month roa//lh   Vital Signs:  Patient profile:   70 year old female Weight:      158.6 pounds Resp:     15 per minute BP sitting:   132 / 60  CC:  Abdominal pain, Back pain, and Type 2 diabetes mellitus follow-up.  History of Present Illness:      This is a 70 year old woman who presents for Hyperlipidemia follow-up.The patient reports muscle aches in buttocks , abdominal pain, and  loose to watery diarrhea, but denies flushing, itching, constipation, and fatigue.The patient denies the following symptoms: chest pain/pressure, exercise intolerance, dypsnea, palpitations, syncope, and pedal edema. Compliance with medications (by patient report) has been near 100%. Dietary compliance has been good.The patient reports no exercise.   Lipids reviewed & risks discussed.She continues to smoke 1 ppd.      The patient also presents with Abdominal pain.The patient reports nausea, but denies vomiting, melena, hematochezia, anorexia, and hematemesis.The location of the pain is diffuse, mainly laterally.  The pain is described as intermittent and dull.The patient denies the following symptoms: fever, weight loss, dysuria, jaundice, dark urine, and vaginal bleeding.The pain  has no triggers.The bowel changes &  pain is better with Immodium . No colonoscopy to date ; "I'm chicken".      The patient also presents with buttocks pain.The patient denies fever, chills, weakness, loss of sensation, fecal incontinence, urinary incontinence, and urinary retention.The  does not radiate.The pain is made worse by walking.The pain is made better by inactivity.Risk factors for serious underlying conditions include duration of pain > 1 month ( since Father's Day) and age >= 68 years.Tylenol helps; it is improved today.       The patient is also here for Type 2 diabetes mellitus follow-up. The patient denies polyuria, polydipsia, and blurred vision.Other symptoms include occasional intermittent claudication. The patient  denies the following symptoms: orthostatic symptoms, poor wound healing, vision loss, and foot ulcer.The patient has been measuring capillary blood glucose after dinner ( < 380 after eating fish, usually < 185). Since the last visit, the patient reports having had no eye care and no foot care. Labs &risks discussed.  Allergies: 1)  ! Amoxicillin 2)  ! Captopril (Captopril)  Review of Systems MS:  Complains of joint pain; "Dr Shellia Carwin  worked me up for bone cancer". A steroid shot in R elbow relieved the R  mid back & Rshoulder pain.  Physical Exam  General:  in no acute distress; alert and cooperative throughout examination. [place] exhibited as risks discussed Eyes:  No corneal or conjunctival inflammation noted.No icterus Mouth:  Oral mucosa and oropharynx without lesions or exudates.  Teeth in good repair. No pharyngeal erythema.   Lungs:  Normal respiratory effort, chest expands symmetrically. Lungs are clear to auscultation, no crackles or wheezes. Heart:  Normal rate and regular rhythm. S1 and S2 normal without gallop, murmur, click, rub or other extra sounds. Abdomen:  Bowel sounds positive,abdomen soft and non-tender without masses, organomegaly or hernias noted. Msk:  No deformity or scoliosis noted of thoracic or lumbar spine.   Pulses:  R and L carotid,radial,dorsalis pedis and posterior tibial pulses are full and equal bilaterally. L carotd bruit Extremities:  No clubbing, cyanosis, edema, or deformity noted with normal full range of motion of all joints.  Neg SLR bilaterally Neurologic:  alert & oriented X3, strength normal in all extremities, gait normal, and DTRs symmetrical and normal.   Skin:  Intact without suspicious  lesions or rashes. No jaundice Cervical Nodes:  No lymphadenopathy noted Axillary Nodes:  No palpable lymphadenopathy Psych:  memory intact for recent and remote,  good eye contact but again she exhits minimal response when   very significant  risks were reviewed &   discussed. Risks were explained with diagrams.Copy given    Impression & Recommendations:  Problem # 1:  BACK PAIN (ICD-724.5) Buttocks pain Her updated medication list for this problem includes:    Tylenol Extra Strength 500 Mg Tabs (Acetaminophen) .Marland Kitchen... 1 by mouth as needed    Tylenol Arthritis Pain 650 Mg Cr-tabs (Acetaminophen) .Marland Kitchen... 1 by mouth at bedtime    Tramadol Hcl 50 Mg Tabs (Tramadol hcl) .Marland Kitchen... 1/2 -1 every 6 hrs as needed for back pain  Orders: T-Coccyx/Sacrum 2 Views (72220TC) UA Dipstick w/o Micro (manual) (81002)  Problem # 2:  ABDOMINAL PAIN, GENERALIZED (ICD-789.07) Diffuse, especially in lateral abdomen bilaterally Orders: Venipuncture [id number]) TLB-CBC Platelet - w/Differential (85025-CBCD) TLB-Hepatic/Liver Function Pnl (80076-HEPATIC) TLB-Amylase (82150-AMYL) TLB-Lipase (83690-LIPASE) Specimen Handling (99000) UA Dipstick w/o Micro (manual) (81002)  Problem # 3:  DIARRHEA (ICD-787.91) Variable loose to watery stool  Problem # 4:  OTHER AND UNSPECIFIED HYPERLIPIDEMIA (ICD-272.4) Triglycerides not @ goal Her updated medication list for this problem includes:    Simvastatin 40 Mg Tabs (Simvastatin) .Marland Kitchen... Take one tablet daily  Problem # 5:  UNSPECIFIED ESSENTIAL HYPERTENSION (ICD-401.9) Controlled Her updated medication list for this problem includes:    Labetalol Hcl 300 Mg Tabs (Labetalol hcl) .Marland Kitchen... 1 by mouth bid    Spironolactone 25 Mg Tabs (Spironolactone) .Marland Kitchen... Take one tablet daily    Amlodipine Besylate 5 Mg Tabs (Amlodipine besylate) .Marland Kitchen... 1 by mouth two times a day    Diovan 160 Mg Tabs (Valsartan) .Marland Kitchen... 1 once daily  Problem # 6:  DIABETES MELLITUS, TYPE II (ICD-250.00)  Her updated medication list for this problem includes:    Metformin Hcl 500 Mg Tabs (Metformin hcl) .Marland Kitchen... Take one tablet twice daily with two largest meals    Diovan 160 Mg Tabs (Valsartan) .Marland Kitchen... 1 once daily  Complete Medication List: 1)   Simvastatin 40 Mg Tabs (Simvastatin) .... Take one tablet daily 2)  Metformin Hcl 500 Mg Tabs (Metformin hcl) .... Take one tablet twice daily with two largest meals 3)  Valium 5 Mg Tabs (Diazepam) .... 1/2 to 1 by mouth as needed 4)  Onetouch Ultra Test Strp (Glucose blood) .... Test once daily as directed 5)  Labetalol Hcl 300 Mg Tabs (Labetalol hcl) .Marland Kitchen.. 1 by mouth bid 6)  Ester-c Tabs (Bioflavonoid products) .Marland Kitchen.. 1 tab by mouth once daily 7)  Spironolactone 25 Mg Tabs (Spironolactone) .... Take one tablet daily 8)  Allegra 180 Mg Tabs (Fexofenadine hcl) .... As needed 9)  Amlodipine Besylate 5 Mg Tabs (Amlodipine besylate) .Marland Kitchen.. 1 by mouth two times a day 10)  Tylenol Extra Strength 500 Mg Tabs (Acetaminophen) .Marland Kitchen.. 1 by mouth as needed 11)  Tylenol Arthritis Pain 650 Mg Cr-tabs (Acetaminophen) .Marland Kitchen.. 1 by mouth at bedtime 12)  Glucobetic  .Marland Kitchen.. 1 by mouth two times a day 13)  New Life Formula For Women  .Marland Kitchen.. 1 by mouth once daily 14)  Wild Yam Cream  .... As directed 15)  Diovan 160 Mg Tabs (Valsartan) .Marland Kitchen.. 1 once daily 16)  Voltaren 1 % Gel (Diclofenac sodium) .... Apply once daily as needed (do not put anything over this) 17)  Vitamin D3 400 Unit Tabs (Cholecalciferol) .Marland Kitchen.. 1 tab by mouth once daily  18)  Vitamin B-12 100 Mcg Tabs (Cyanocobalamin) .Marland Kitchen.. 1 by mouth once daily 19)  Vitamin E 100 Unit Caps (Vitamin e) .Marland Kitchen.. 1 by mouth once daily 20)  Tramadol Hcl 50 Mg Tabs (Tramadol hcl) .... 1/2 -1 every 6 hrs as needed for back pain  Patient Instructions: 1)  Complete stool cards.Further recommendations pending lab & Xray review. Prescriptions: TRAMADOL HCL 50 MG TABS (TRAMADOL HCL) 1/2 -1 every 6 hrs as needed for back pain  #30 x 1   Entered and Authorized by:   Unice Cobble MD   Signed by:   Unice Cobble MD on 06/10/2010   Method used:   Faxed to ...       [phone]* (retail)       [address]       [place], [place]  [address]       Ph: [phone] or [phone]        Fax: [phone]   RxID:   [id number]   Laboratory Results   Urine Tests   Date/Time Reported: June 10, 2010 11:27 AM   Routine Urinalysis   Color: yellow Appearance: Clear Glucose: negative   (Normal Range: Negative) Bilirubin: negative   (Normal Range: Negative) Ketone: negative   (Normal Range: Negative) Spec. Gravity: <1.005   (Normal Range: 1.003-1.035) Blood: negative   (Normal Range: Negative) pH: 6.0   (Normal Range: 5.0-8.0) Protein: negative   (Normal Range: Negative) Urobilinogen: negative   (Normal Range: 0-1) Nitrite: negative   (Normal Range: Negative) Leukocyte Esterace: negative   (Normal Range: Negative)    Comments: Heath Lark  June 10, 2010 11:27 AM

## 2010-12-12 NOTE — Medication Information (Signed)
Summary: Change in Labetalol Rx/[facility]  Change in Labetalol Rx/[facility]   Imported By: Edmonia [name] 07/31/2010 09:23:05  _____________________________________________________________________  External Attachment:    Type:   Image     Comment:   External Document

## 2010-12-12 NOTE — Assessment & Plan Note (Signed)
Review of gastrointestinal problems: 1. 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  She was having chronic diarrhea.  October 2011: very good response Entocort, changing to prednisone do to extreme expense (Prednisone 10 mg a day for one week, then 5 mg a day for one month, then 5 mg every other day for one month). 2. routine risk for colon cancer, hyperplastic polyp removed September 2011. Next colonoscopy at 10 year interval for routine screening.    History of Present Illness Visit Type: Follow-up Visit Primary GI MD: Owens Loffler MD Primary Provider: Unice Cobble, MD  Requesting Provider: na Chief Complaint: F/u visit  History of Present Illness:      she has been on low dose prednisone, slowly weaning off. Currently on 5mg  every other day for the past 3 weeks. She has had NO diarrhea for a long time.           Current Medications (verified): 1)  Metformin Hcl 500 Mg  Tabs (Metformin Hcl) .Marland Kitchen.. 1 By Mouth Twice Daily With Two Largest Meals **lab Appointment Due** 2)  Valium 5 Mg Tabs (Diazepam) .... 1/2 To 1 By Mouth As Needed 3)  Onetouch Ultra Test  Strp (Glucose Blood) .... Test Once Daily As Directed 4)  Labetalol Hcl 300 Mg Tabs (Labetalol Hcl) .Marland Kitchen.. 1 By Mouth Bid 5)  Ester-C  Tabs (Bioflavonoid Products) .Marland Kitchen.. 1 Tab By Mouth Once Daily 6)  Spironolactone 25 Mg  Tabs (Spironolactone) .... Take One Tablet Daily 7)  Allegra 180 Mg  Tabs (Fexofenadine Hcl) .... As Needed 8)  Ibuprofen 200 Mg Tabs (Ibuprofen) .... Up To 6 Tabs Daily 9)  Glucobetic .Marland Kitchen.. 1 By Mouth Two Times A Day 10)  New Life Formula For Women .Marland Kitchen.. 1 By Mouth Once Daily 11)  Wild Yam Cream .... As Directed 12)  Diovan 160 Mg Tabs (Valsartan) .Marland Kitchen.. 1 Once Daily 13)  Vitamin D3 400 Unit Tabs (Cholecalciferol) .Marland Kitchen.. 1 Tab By Mouth Once Daily 14)  Vitamin B-12 100 Mcg Tabs (Cyanocobalamin) .Marland Kitchen.. 1 By Mouth Once Daily 15)  Vitamin E 100 Unit Caps (Vitamin E) .Marland Kitchen.. 1 By Mouth Once  Daily 16)  Prednisone 5 Mg  Tabs (Prednisone) .... Take As Directed 17)  Amlodipine Besylate 5 Mg Tabs (Amlodipine Besylate) .Marland Kitchen.. 1 Two Times A Day 18)  Crestor 10 Mg Tabs (Rosuvastatin Calcium) .... 1/2 Once Daily  Allergies (verified): 1)  ! Amoxicillin 2)  ! Captopril (Captopril) 3)  ! Simvastatin (Simvastatin)  Vital Signs:  Patient profile:   70 year old female Height:      65 inches Weight:      158 pounds BMI:     26.39 BSA:     1.79 Pulse rate:   88 / minute Pulse rhythm:   regular BP sitting:   124 / 60  (left arm) Cuff size:   regular  Vitals Entered By: Hope Pigeon CMA (November 13, 2010 10:48 AM)  Physical Exam  Additional Exam:  Constitutional: generally well appearing Psychiatric: alert and oriented times 3 Abdomen: soft, non-tender, non-distended, normal bowel sounds    Impression & Recommendations:  Problem # 1:  lymphocytic colitis she is doing very well on long tapering course of prednisone at low dose. She will be off of prednisone completely by next week and we will see how she does. She understands that symptoms can return and she will call my office if that occurs.  Patient Instructions: 1)  Continue to taper off the prednisone. 2)  Call Dr. Ardis Hughs if diarrhea returns. 3)  A copy of this information will be sent to Dr. Linna Darner. 4)  The medication list was reviewed and reconciled.  All changed / newly prescribed medications were explained.  A complete medication list was provided to the patient / caregiver.

## 2010-12-12 NOTE — Progress Notes (Signed)
Summary: refill  Phone Note Refill Request Message from:  Fax from Pharmacy on Apr 05, 2010 10:42 AM  Refills Requested: Medication #1:  AMLODIPINE BESYLATE 5 MG  TABS 1 by mouth two times a day  Medication #2:  DIOVAN 160 MG TABS 1 once daily [facility] - fax [phone] -  ph [phone]   Method Requested: Fax to [facility] Initial call taken by: Arbie [place],  Apr 05, 2010 10:43 AM    Prescriptions: DIOVAN 160 MG TABS (VALSARTAN) 1 once daily  #90 x 1   Entered by:   Georgette Dover   Authorized by:   Unice Cobble MD   Signed by:   Georgette Dover on 04/05/2010   Method used:   Faxed to ...       [facility] (mail-order)             ,          Ph: [phone]       Fax: [phone]   RxIDWY:[id number] AMLODIPINE BESYLATE 5 MG  TABS (AMLODIPINE BESYLATE) 1 by mouth two times a day  #180 x 1   Entered by:   Georgette Dover   Authorized by:   Unice Cobble MD   Signed by:   Georgette Dover on 04/05/2010   Method used:   Faxed to ...       [facility] (mail-order)             ,          Ph: [phone]       Fax: [phone]   RxID:   [id number]

## 2011-01-02 ENCOUNTER — Other Ambulatory Visit: Payer: Self-pay | Admitting: Internal Medicine

## 2011-01-02 ENCOUNTER — Encounter (INDEPENDENT_AMBULATORY_CARE_PROVIDER_SITE_OTHER): Payer: Self-pay | Admitting: *Deleted

## 2011-01-02 ENCOUNTER — Other Ambulatory Visit (INDEPENDENT_AMBULATORY_CARE_PROVIDER_SITE_OTHER): Payer: Medicare Other

## 2011-01-02 DIAGNOSIS — T887XXA Unspecified adverse effect of drug or medicament, initial encounter: Secondary | ICD-10-CM

## 2011-01-02 DIAGNOSIS — IMO0001 Reserved for inherently not codable concepts without codable children: Secondary | ICD-10-CM

## 2011-01-02 DIAGNOSIS — E785 Hyperlipidemia, unspecified: Secondary | ICD-10-CM

## 2011-01-02 DIAGNOSIS — E119 Type 2 diabetes mellitus without complications: Secondary | ICD-10-CM

## 2011-01-02 LAB — HEPATIC FUNCTION PANEL
AST: 23 U/L (ref 0–37)
Albumin: 4.4 g/dL (ref 3.5–5.2)
Alkaline Phosphatase: 66 U/L (ref 39–117)
Total Bilirubin: 0.3 mg/dL (ref 0.3–1.2)

## 2011-01-02 LAB — LIPID PANEL
Total CHOL/HDL Ratio: 5
VLDL: 63.2 mg/dL — ABNORMAL HIGH (ref 0.0–40.0)

## 2011-01-02 LAB — LDL CHOLESTEROL, DIRECT: Direct LDL: 109.2 mg/dL

## 2011-01-02 LAB — HEMOGLOBIN A1C: Hgb A1c MFr Bld: 7.2 % — ABNORMAL HIGH (ref 4.6–6.5)

## 2011-01-06 ENCOUNTER — Encounter: Payer: Self-pay | Admitting: Internal Medicine

## 2011-01-09 ENCOUNTER — Encounter: Payer: Self-pay | Admitting: Internal Medicine

## 2011-01-09 ENCOUNTER — Ambulatory Visit (INDEPENDENT_AMBULATORY_CARE_PROVIDER_SITE_OTHER): Payer: Medicare Other | Admitting: Internal Medicine

## 2011-01-09 DIAGNOSIS — F17200 Nicotine dependence, unspecified, uncomplicated: Secondary | ICD-10-CM

## 2011-01-09 DIAGNOSIS — E785 Hyperlipidemia, unspecified: Secondary | ICD-10-CM

## 2011-01-09 DIAGNOSIS — E1165 Type 2 diabetes mellitus with hyperglycemia: Secondary | ICD-10-CM

## 2011-01-16 NOTE — Assessment & Plan Note (Signed)
Summary: 3 MONTH ROV//[facility]   Vital Signs:  Patient profile:   70 year old female Weight:      160 pounds BMI:     26.72 Pulse rate:   76 / minute Resp:     15 per minute BP sitting:   122 / 68  (right arm) Cuff size:   large  Vitals Entered By: Georgette Dover CMA (January 09, 2011 9:16 AM) CC: 3 month follow-up (patient with copy of mailed labs ), Type 2 diabetes mellitus follow-up   Primary Care Provider:  Unice Cobble, MD   CC:  3 month follow-up (patient with copy of mailed labs ) and Type 2 diabetes mellitus follow-up.  History of Present Illness:    Labs reviewed & risks discussed; A1c risk is 44% and smoking risk 2-3X normal. She was on steroids > 2 months. She wants to take Loclo with Stevia.  Role of High Fructose Corn Syrup in raising Triglycerides discussed.She  denies polyuria, polydipsia, blurred vision, self managed hypoglycemia, and numbness of extremities.  The patient denies the following symptoms: neuropathic pain, chest pain, orthostatic symptoms, poor wound healing, vision loss, and foot ulcer.  Since the last visit the patient reports not exercising regularly.  The patient has been measuring capillary blood glucose before breakfast , average 130.   While off statin post meal glucoses were < 100.  Current Medications (verified): 1)  Metformin Hcl 500 Mg  Tabs (Metformin Hcl) .Marland Kitchen.. 1 By Mouth Twice Daily With Two Largest Meals 2)  Valium 5 Mg Tabs (Diazepam) .... 1/2 To 1 By Mouth As Needed 3)  Onetouch Ultra Test  Strp (Glucose Blood) .... Test Once Daily As Directed 4)  Ester-C  Tabs (Bioflavonoid Products) .Marland Kitchen.. 1 Tab By Mouth Once Daily 5)  Spironolactone 25 Mg  Tabs (Spironolactone) .... Take One Tablet Daily 6)  Allegra 180 Mg  Tabs (Fexofenadine Hcl) .... As Needed 7)  Ibuprofen 200 Mg Tabs (Ibuprofen) .... Up To 6 Tabs Daily 8)  Glucobetic .Marland Kitchen.. 1 By Mouth Two Times A Day 9)  New Life Formula For Women .Marland Kitchen.. 1 By Mouth Once Daily 10)  Wild Yam Cream .... As  Directed 11)  Diovan 160 Mg Tabs (Valsartan) .Marland Kitchen.. 1 Once Daily 12)  Vitamin D3 400 Unit Tabs (Cholecalciferol) .Marland Kitchen.. 1 Tab By Mouth Once Daily 13)  Vitamin B-12 100 Mcg Tabs (Cyanocobalamin) .Marland Kitchen.. 1 By Mouth Once Daily 14)  Vitamin E 100 Unit Caps (Vitamin E) .Marland Kitchen.. 1 By Mouth Once Daily 15)  Prednisone 5 Mg  Tabs (Prednisone) .... Take As Directed 16)  Amlodipine Besylate 5 Mg Tabs (Amlodipine Besylate) .Marland Kitchen.. 1 Two Times A Day 17)  Crestor 10 Mg Tabs (Rosuvastatin Calcium) .... 1/2 Once Daily 18)  Labetalol Hcl 300 Mg Tabs (Labetalol Hcl) .Marland Kitchen.. 1 By Mouth Two Times A Day  Allergies: 1)  ! Amoxicillin 2)  ! Captopril (Captopril) 3)  ! Simvastatin (Simvastatin)  Physical Exam  General:  in no acute distress; alert,appropriate and cooperative throughout examination Lungs:  Normal respiratory effort, chest expands symmetrically. Lungs are clear to auscultation, no crackles or wheezes. Heart:  normal rate, regular rhythm, no gallop, no rub, no JVD, and grade 1 /6 systolic murmur.   Pulses:  R and L carotid,radial  pulses are full and equal bilaterally. Pedal pulses decrased. L carotid bruit   Impression & Recommendations:  Problem # 1:  DIABETES MELLITUS, UNCONTROLLED (ICD-250.02)  Her updated medication list for this problem includes:    Metformin  Hcl 500 Mg Tabs (Metformin hcl) .Marland Kitchen... 1 by mouth twice daily with two largest meals    Diovan 160 Mg Tabs (Valsartan) .Marland Kitchen... 1 once daily  Problem # 2:  SMOKER (ICD-305.1) risk discussed  Complete Medication List: 1)  Metformin Hcl 500 Mg Tabs (Metformin hcl) .Marland Kitchen.. 1 by mouth twice daily with two largest meals 2)  Valium 5 Mg Tabs (Diazepam) .... 1/2 to 1 by mouth as needed 3)  Onetouch Ultra Test Strp (Glucose blood) .... Test once daily as directed 4)  Ester-c Tabs (Bioflavonoid products) .Marland Kitchen.. 1 tab by mouth once daily 5)  Spironolactone 25 Mg Tabs (Spironolactone) .... Take one tablet daily 6)  Allegra 180 Mg Tabs (Fexofenadine hcl) .... As  needed 7)  Ibuprofen 200 Mg Tabs (Ibuprofen) .... Up to 6 tabs daily 8)  Glucobetic  .Marland Kitchen.. 1 by mouth two times a day 9)  New Life Formula For Women  .Marland Kitchen.. 1 by mouth once daily 10)  Wild Yam Cream  .... As directed 11)  Diovan 160 Mg Tabs (Valsartan) .Marland Kitchen.. 1 once daily 12)  Vitamin D3 400 Unit Tabs (Cholecalciferol) .Marland Kitchen.. 1 tab by mouth once daily 13)  Vitamin B-12 100 Mcg Tabs (Cyanocobalamin) .Marland Kitchen.. 1 by mouth once daily 14)  Vitamin E 100 Unit Caps (Vitamin e) .Marland Kitchen.. 1 by mouth once daily 15)  Prednisone 5 Mg Tabs (Prednisone) .... Take as directed 16)  Amlodipine Besylate 5 Mg Tabs (Amlodipine besylate) .Marland Kitchen.. 1 two times a day 17)  Crestor 10 Mg Tabs (Rosuvastatin calcium) .... 1/2 once daily 18)  Labetalol Hcl 300 Mg Tabs (Labetalol hcl) .Marland Kitchen.. 1 by mouth two times a day  Patient Instructions: 1)  Avoid HFCS as discussed. Do not take the supplement & Crestor @ same . 2)  Please schedule a follow-up appointment in 3 months. 3)  Hepatic Panel prior to visit, ICD-9:995.20 4)  Lipid Panel prior to visit, ICD-9:272.4 5)  HbgA1C prior to visit, ICD-9:250.02 6)  Urine Microalbumin prior to visit, ICD-9:250.02   Orders Added: 1)  Est. Patient Level III [id number]

## 2011-01-21 NOTE — Letter (Signed)
Summary: [facility]   Imported By: Laural Benes 01/14/2011 13:27:41  _____________________________________________________________________  External Attachment:    Type:   Image     Comment:   External Document

## 2011-01-24 ENCOUNTER — Telehealth (INDEPENDENT_AMBULATORY_CARE_PROVIDER_SITE_OTHER): Payer: Self-pay | Admitting: *Deleted

## 2011-01-28 ENCOUNTER — Ambulatory Visit: Payer: Medicare Other | Attending: Orthopedic Surgery

## 2011-01-28 DIAGNOSIS — IMO0001 Reserved for inherently not codable concepts without codable children: Secondary | ICD-10-CM | POA: Insufficient documentation

## 2011-01-28 DIAGNOSIS — M545 Low back pain, unspecified: Secondary | ICD-10-CM | POA: Insufficient documentation

## 2011-01-28 DIAGNOSIS — M25659 Stiffness of unspecified hip, not elsewhere classified: Secondary | ICD-10-CM | POA: Insufficient documentation

## 2011-01-28 NOTE — Progress Notes (Signed)
Summary: Amlodipine refill  Phone Note Refill Request Call back at Home Phone [phone] Message from:  Patient on January 24, 2011 10:08 AM  Refills Requested: Medication #1:  AMLODIPINE BESYLATE 5 MG TABS 1 two times a day [facility]---needs 90 day plus refills  Next Appointment Scheduled: lab = 5/25;      Fri 6/1   [place] Initial call taken by: Berneta Sages,  January 24, 2011 10:09 AM    Prescriptions: AMLODIPINE BESYLATE 5 MG TABS (AMLODIPINE BESYLATE) 1 two times a day  #180 x 2   Entered by:   [place] by:   Unice Cobble MD   Signed by:   Georgette Dover CMA on 01/24/2011   Method used:   Faxed to ...       [facility] (mail-order)             , [place]         Ph: [phone]       Fax: [phone]   RxID:   [id number]

## 2011-01-31 ENCOUNTER — Ambulatory Visit: Payer: Medicare Other | Admitting: Physical Therapy

## 2011-02-04 ENCOUNTER — Ambulatory Visit: Payer: Medicare Other | Attending: Orthopedic Surgery

## 2011-02-04 DIAGNOSIS — M545 Low back pain, unspecified: Secondary | ICD-10-CM | POA: Insufficient documentation

## 2011-02-04 DIAGNOSIS — IMO0001 Reserved for inherently not codable concepts without codable children: Secondary | ICD-10-CM | POA: Insufficient documentation

## 2011-02-04 DIAGNOSIS — M25659 Stiffness of unspecified hip, not elsewhere classified: Secondary | ICD-10-CM | POA: Insufficient documentation

## 2011-02-06 ENCOUNTER — Ambulatory Visit: Payer: Medicare Other | Admitting: Physical Therapy

## 2011-02-21 ENCOUNTER — Other Ambulatory Visit: Payer: Self-pay | Admitting: Internal Medicine

## 2011-03-25 NOTE — Assessment & Plan Note (Signed)
[place]                            CARDIOLOGY OFFICE NOTE   Sheila Melton, Sheila Melton                         MRN:          [id number]  DATE:06/07/2008                            DOB:          Apr 09, 1941    Sheila Melton is an extremely pleasant 70 year old female who I recently  evaluated for subclavian stenosis and hypertension.  Note, per her  Myoview in December 2001 showed an ejection fraction of 74% with no  ischemia or infarction.  We did schedule her to have an echocardiogram  secondary to a murmur, which was performed on May 19, 2008.  Her LV  function was normal.  There was mild aortic sclerosis.  There was mild  mitral regurgitation.  She also had followup Dopplers performed.  The  carotid artery showed 0-39% bilateral internal carotid artery stenosis.  The right vertebral artery was patent.  The left vertebral artery was  patent.  The bilateral brachial artery waveforms were brisk and  triphasic.  Since that time, she has done well.  There is no dyspnea,  chest pain, palpitations, or syncope.  She does feel somewhat fatigued  after taking her blood pressure medicines in the morning.  This lasts  for several hours and then improves.   Her medications include,  1. Zocor 40 mg p.o. daily.  2. Aspirin 81 mg p.o. daily.  3. Metformin 1 g p.o. daily.  4. Teveten 600 mg tablets one half p.o. b.i.d.  5. Labetalol 300 mg p.o. b.i.d.  6. Ester-C.  7. Amlodipine 5 mg p.o. b.i.d.  8. Glucobetic.  9. Spironolactone 25 mg p.o. daily.   PHYSICAL EXAMINATION:  VITAL SIGNS:  Today, blood pressure of 134/77 in  the right arm and 132/76 in the left.  Her pulse is 92.  She weighs 155  pounds.  HEENT:  Normal.  NECK:  Supple.  CHEST:  Clear.  CARDIOVASCULAR:  Regular rate and rhythm.  ABDOMEN:  No tenderness.  EXTREMITIES:  No edema.   DIAGNOSES:  1. History of subclavian stenosis - Sheila Melton followup Dopplers are      outlined above.  We will continue  with medical therapy.  Note, she      is not having symptoms of left upper extremity pain.  She will      continue on her aspirin and statin.  Note, there is approximately      10-15 mm difference in the systolic pressures between her left and      right arms.  2. Hypertension - Blood pressure appears to be adequately controlled.      She is complaining of fatigue after taking her morning medicines.      I have asked her to take her Norvasc at night to see if this      improves her symptoms.  3. Hyperlipidemia - This is being managed by Dr. Linna Darner, and she will      continue on her statin.  4. Diabetes mellitus.  5. Tobacco abuse - We discussed importance of discontinuing this.  6. History of systolic murmur - There was  aortic sclerosis on her      echocardiogram.   We will see her back in 1 year.     Denice Bors Stanford Breed, MD, [facility]  Electronically Signed    BSC/MedQ  DD: 06/07/2008  DT: 06/08/2008  Job #: [id number]

## 2011-03-25 NOTE — Assessment & Plan Note (Signed)
[place]                            CARDIOLOGY OFFICE NOTE   SHAWNDALE, COLLINGSWORTH                         MRN:          [id number]  DATE:04/27/2008                            DOB:          [birth year]    The patient is a very pleasant 70 year old female who I am asked to  evaluate for subclavian stenosis and hypertension.  Note, she has been  seen in this office in years past.  She was last seen by Dr. Lyndel Safe in  November 2001.  She does have a history of Myoview performed in December  2001.  At that time, she had an ejection fraction of 74%.  There is no  ischemia or infarction.  She also has a history of hypertension, but  noted previous abdominal ultrasound showed no renal artery stenosis.  She also has a history of subclavian stenosis.  By Dopplers in February  2000, she had no evidence of internal carotid artery stenosis  bilaterally.  However, there was retrograde vertebral artery flow and  decreased blood pressure in the left arm with increased frequency of  left subclavian, all consistent with subclavian steal.  She has been  treated medically.  Again, she has not been seen since November 2001.  She does not have dyspnea on exertion, orthopnea, PND, pedal edema,  chest pain, or syncope.  She does state that her Toprol was recently  discontinued in favor of labetalol, and her heart rate has been somewhat  elevated, but she is not having significant palpitations.  She  occasionally feels dizzy with standing when she is outside.  Because of  her hypertension, we were asked to further evaluate.  Note, her  medications have recently been changed.   CURRENT MEDICATIONS:  1. Zocor 40 mg p.o. daily.  2. Metformin 1000 mg p.o. daily.  3. Teveten  600 mg tablets one-half t.i.d.  4. Valium as needed.  5. Labetalol 300 mg p.o. b.i.d.  6. Ester-C.  7. Amlodipine 5 mg p.o. b.i.d.  8. Glucobetic b.i.d.   She takes Valium, Allegra, and Tylenol as  needed.   ALLERGIES:  She has an allergy to AMOXICILLIN.   SOCIAL HISTORY:  She does smoke.  She does not consume alcohol.   FAMILY HISTORY:  Positive for coronary artery disease in her brother and  her father.   PAST MEDICAL HISTORY:  Significant for hypertension.  She also has a  history of diabetes mellitus for approximately 2 years.  She also has  hyperlipidemia.  She has a history of subclavian steal as described  above.  She has had a previous hysterectomy. She also has allergies and  arthritis.  She has a history of anxiety/depression by her report.  There is no other significant past medical history noted.   REVIEW OF SYSTEMS:  She denies any headaches, fevers, or chills.  There  is no productive cough or hemoptysis.  There is no dysphagia,  odynophagia, melena or hematochezia.  There is no dysuria or hematuria.  There are no rashes or seizure activity.  There is no orthopnea, PND, or  pedal edema.  She does not have any pain in her left upper extremity  with use.  The remaining systems were negative.   PHYSICAL EXAMINATION:  VITAL SIGNS:  Blood pressure in the left arm of  181/96.  Her pulse is 90.  In the right arm, she was 160/70.  Her weight  is 156 pounds.  Her pulse is 90.  GENERAL:  She is well-developed, well-nourished, in no acute distress.  She does not appear to be depressed.  HEENT:  Normal.  Normal eyelids.  NECK:  Supple with normal upstroke bilaterally.  She did have a left  carotid bruit.  There is no jugular venous distension, and no  thyromegaly noted.  CHEST:  Clear to auscultation.  Normal expansion.  CARDIOVASCULAR:  Regular rhythm with normal S1 and S2.  There is a soft  2/6 systolic ejection murmur at the left sternal border.  There is no S3  or S4.  ABDOMEN:  Nontender, nondistended, positive bowel sounds.  No  hepatosplenomegaly.  No masses appreciated.  There is no abdominal  bruit.  BACK:  Normal.  SKIN:  Warm and dry.  There is no peripheral  clubbing.  EXTREMITIES:  She has 2+ radial pulse in both the right upper and left  upper extremity.  She has 2+ femoral pulses bilaterally and no bruits.  No edema.  I cannot palpate no cords.  She has 2+ dorsalis pedis pulses  bilaterally.  NEUROLOGIC:  Grossly intact.   Electrocardiogram shows sinus rhythm at a rate of 89.  The axis is  normal.  There are no significant ST changes.   DIAGNOSES:  1. Subclavian stenosis - Mrs. Shepperd is not having any pain in her left      upper extremity with use.  It is therefore asymptomatic.  We will      continue with her aspirin, statin.  We will schedule her to have      repeat carotid/subclavian Dopplers.  2. Hypertension - her blood pressure is elevated today.  However, she      states that Dr. Linna Darner recently adjusted her regimen, and her      systolic is running in the 140-150 range at home.  I have asked her      to track her blood pressure in both arms over the next 6 weeks.      She will write these down and keep records for [place].  She will return      in [redacted] weeks along with her blood pressure cuff, and we will      correlate it with ours.  We will then adjust her regimen as      indicated based on her readings.  3. Hyperlipidemia - she will continue on her statin and this is being      monitored by Dr. Linna Darner.  4. Diabetes mellitus - she will continue on her present medication and      this will be managed by Dr. Linna Darner.  5. Tobacco abuse - we discussed the importance of discontinuing this      for between 3-10 minutes.  6. Systolic murmur on examination - we will schedule her to have an      echocardiogram to quantify her LV function.  Note, the patient did      have a Myoview previously that showed no ischemia or infarction.   I will see her back in 6 weeks.     Denice Bors Stanford Breed, MD, [facility]  Electronically  Signed    BSC/MedQ  DD: 04/27/2008  DT: 04/28/2008  Job #: [id number]   cc:   Darrick Penna. Linna Darner, MD,FACP,FCCP

## 2011-03-25 NOTE — Op Note (Signed)
NAME:  Sheila Melton, Sheila Melton NO.:  [id number]   MEDICAL RECORD NO.:  [id number]          PATIENT TYPE:  AMB   LOCATION:  [place]                           FACILITY:  [facility]   PHYSICIAN:  Lucille Passy. Ulanda Edison, M.D. DATE OF BIRTH:  02-01-1941   DATE OF PROCEDURE:  DATE OF DISCHARGE:                               OPERATIVE REPORT   PREOPERATIVE DIAGNOSES:  Fibroids, some of which are submucosal;  recurrent postmenopausal bleeding, possible mass of the left ovary.   POSTOPERATIVE DIAGNOSES:  Fibroids, some of which are submucosal;  recurrent postmenopausal bleeding, possible mass of the left ovary.  No  mass seen on the ovary, probably a 2-cm mass in the parametrial tissue  close to the cervicouterine junction on the left, adhesions of both  ovaries to the pelvic walls.   OPERATION:  Abdominal hysterectomy, bilateral salpingo-oophorectomy,  division of adhesions.   SURGEON:  Lucille Passy. Ulanda Edison, M.D.   Terrence DupontMarvel Plan.   ANESTHESIA:  General anesthesia.   DESCRIPTION OF PROCEDURE:  The patient was brought to the operating room  and placed under satisfactory general anesthesia.  The abdomen, vulva,  vagina and urethra were prepped with Betadine solution.  A Foley  catheter was inserted into the bladder and hooked to straight drain.  The patient was then placed supine.  The abdomen was draped as a sterile  field.  A transverse incision 2 fingerbreadths above the pubis was made  and carried in layers through the skin and subcutaneous tissue and  fascia.  The fascia was separated from the rectus muscles superiorly and  inferiorly.  The rectus muscle was split in the midline.  The peritoneum  was opened vertically.  I could not do a very good job of examining the  upper abdomen.  I did not feel the liver or the gallbladder.  I really  could not feel the right kidney.  I could feel the bottom of the  inferior pole of the left kidney.  Exploration of the pelvis revealed  both  ovaries were adherent to the pelvic wall.  The uterus was slightly  enlarged, retroverted.  The anterior and posterior cul-de-sacs appeared  normal.  Packs and retractors were reused to prepare the operative  field.  I elevated the uterus into the incision with Kelly clamps across  both upper pedicles, divided both round ligaments and created a bladder  flap, dividing the round ligaments with the Bovie.  I then divided both  infundibulopelvic ligaments after dissecting the ovaries free of their  adhesions to the pelvic walls, doubly suture ligated both upper  pedicles, the infundibulopelvic ligaments and then skeletonized the  uterine vessels bilaterally.  In working down to the uterine vessels on  the left, there appeared to be about a 2 cm white structure in the  parametrial tissue that I felt needed to be removed.  I thought that it  was a fibroid.  I tried to dissect with the Bovie inferior to the mass  to try to keep it on the uterine site.  I then was able to clamp below  the mass and continued down the sides of the uterus until I reached the  left vaginal angle and then removed the uterus by transecting the upper  vagina.  The right uterosacral ligament was clamped, cut and suture  ligated in progress and it was held.  There was not as clear-cut a left  uterus sacral ligament on the left possibly due to this structure on the  left that was in the parametrium.  I sutured both vaginal angles and  then closed the intervening portion of the vagina with interrupted  figure-of-eight sutures of 0 Vicryl.  I sutured the uterosacral  ligaments together in the midline.  Then I looked for the left ureter.  I found it well below the operative field.  Even though Dr. Marvel Plan  nor I could see the ureter, we could palpate it very clearly well below  the site of dissection of this parametrial mass.  I could not see any  abnormality of either ovary.  I had taken pelvic washings but after   thoroughly inspecting the ovaries, I discarded the washings and it is  possible that whatever the mass is on the left parametrium, that that is  responsible for the abnormality on the ultrasound that suggested a 2-cm  mass on the surface of the left ovary with a highly echogenic focus.  I  liberally irrigated the pelvis, inspected it for hemostasis, and found  hemostasis to be almost complete, completed the hemostasis with the  Bovie.  I then removed all 5 packs, and closed the abdominal wall with a  running suture of 0 Vicryl on the peritoneum, interrupted 0 Vicryl on  the rectus muscle, 2 running sutures of 0 Vicryl on this fascia, a  running 3-0 Vicryl on the subcu tissue, and staples on the skin.  The  urine was clear during the procedure; she had put out 150 mL.  I did  open the uterus on the operating table and there was a definite  submucous fibroid present.  The patient was returned to recovery in  satisfactory condition.  Blood loss was estimated by the nurse  anesthetist at 100 mL.      Lucille Passy. Ulanda Edison, M.D.  Electronically Signed     TFH/MEDQ  D:  01/12/2008  T:  01/12/2008  Job:  [id number]

## 2011-03-25 NOTE — H&P (Signed)
NAME:  Sheila Melton, Sheila Melton NO.:  [id number]   MEDICAL RECORD NO.:  [id number]          PATIENT TYPE:  AMB   LOCATION:  [place]                           FACILITY:  [facility]   PHYSICIAN:  Lucille Passy. Ulanda Edison, M.D. DATE OF BIRTH:  1940/11/16   DATE OF ADMISSION:  01/12/2008  DATE OF DISCHARGE:                              HISTORY & PHYSICAL   HISTORY OF PRESENT ILLNESS:  This is 70 year old white married female,  para 1-0-0-1 who was admitted to the hospital for abdominal hysterectomy  and bilateral salpingo-oophorectomy because of persistent postmenopausal  bleeding, submucous fibroids, and a question of an ovarian mass.  This  patient was advised to stop her hormone replacement therapy in July of  2008.  She stopped her therapy then and bled in October of 2008,  November of 2008, and had a regular period in December of 2008.  She  underwent an endometrial biopsy that showed superficial fragments of  atrophic endometrium and blood.  She then underwent an ultrasound which  showed multiple fibroids and also a 10 x 13 x 16-mm avascular mass on  the left ovary that contained a 2-mm highly echogenic focus.  The  patient had undergone a D&C by Dr. Joneen Caraway in 1997, and at that time he  had noticed submucous fibroids.  The patient was advised to consider a  hysterectomy.  She decided to go ahead and have the uterus, tubes, and  ovaries removed.   ALLERGIES:  AMOXICILLIN AND NOVOCAIN.   PAST MEDICAL HISTORY:  1. Diabetes.  2. High cholesterol.  3. High blood pressure.   PAST SURGICAL HISTORY:  D&C.   SOCIAL HISTORY:  She smokes 1 pack a day.  She does not drink alcohol.  She is presently unemployed.   REVIEW OF SYSTEMS:  She has no major problems in her review of systems.   FAMILY HISTORY:  Her mom died at 27 of heart failure.  Father died at 73  with heart problems.  She has a 74 year old brother who has a history of  heart attacks and Parkinson's disease, a 47 year old sister  with high  blood pressure, and a 36 year old sister with high blood pressure.  The  patient has a 53 year old daughter who is healthy.   MEDICATIONS:  1. Simvastatin 40 mg 1 daily.  2. Metformin 500 mg 1 twice a day.  3. Teveten 600 mg 1 tablet every morning.  4. Valium 5 mg 1/2 to 1 tablet by mouth as needed.  5. Labetalol 300 mg 1 twice a day.  6. Verapamil hydrochloride CR 240 mg 1 daily.  7. Hydrochlorothiazide 12.5 mg 1 daily.   PHYSICAL EXAMINATION:  GENERAL:  Well-developed, well-nourished white  female in no distress.  HEENT:  Normal.  VITAL SIGNS:  Blood pressure 146/82, pulse is 80.  Weight is 160 pounds.  HEART:  Normal size and sounds.  No murmurs.  LUNGS:  Clear to auscultation.  ABDOMEN:  Soft and nontender.  BREASTS:  Negative.  PELVIC:  Vulva and vagina are clean.  The cervix is clean.  The uterus  is retroverted,  about 8 weeks size.  The adnexa are free of masses.  RECTAL:  Rectal exam done within the last few months was negative.   ADMITTING IMPRESSION:  1. Leiomyomata uteri.  2. Recurrent postmenopausal bleeding.  3. Left ovarian mass.   The patient is admitted for abdominal hysterectomy and bilateral  salpingo-oophorectomy.  She understands the risks of surgery to include  but are not limited to heart attack, stroke, pulmonary embolus, wound  disruption, hemorrhage with need for reoperation and/or transfusion,  fistula formation, nerve injury, intestinal obstruction.  She  understands and agrees to proceed.  She has also been informed that the  impact on her sex drive cannot be quantitated, and there is no FDA  approved medication for stimulating the sex drive.      Lucille Passy. Ulanda Edison, M.D.  Electronically Signed     TFH/MEDQ  D:  01/11/2008  T:  01/11/2008  Job:  [id number]

## 2011-03-26 ENCOUNTER — Other Ambulatory Visit: Payer: Self-pay | Admitting: Internal Medicine

## 2011-03-28 NOTE — Discharge Summary (Signed)
NAMEJAHZARRA, Sheila Melton NO.:  [id number]   MEDICAL RECORD NO.:  [id number]          PATIENT TYPE:  INP   LOCATION:  9305                          FACILITY:  [facility]   PHYSICIAN:  Lucille Passy. Ulanda Edison, M.D. DATE OF BIRTH:  June 30, 1941   DATE OF ADMISSION:  01/12/2008  DATE OF DISCHARGE:  01/14/2008                               DISCHARGE SUMMARY   A 70 year old white married female para 1-0-0-1 who was admitted to the  hospital for abdominal hysterectomy, bilateral salpingo-oophorectomy  because of persistent postmenopausal bleeding, submucosal fibroids, and  a question of an ovarian mass.  The patient had undergone endometrial  biopsy that showed superficial fragments of atrophic endometrium.  An  ultrasound showed multiple fibroids and also a 10 x 13 x 16 mm avascular  mass on the left ovary that contained a 2-mm highly echogenic focus.  The patient underwent laparotomy, abdominal hysterectomy, bilateral  salpingo-oophorectomy.  She was noted to have no abnormality of the left  ovary.  However, there was an abnormality, approximately a 2-cm mass in  the left parametrium close to the uterine vessels.  This was removed  along with the specimen.  Postoperatively, the patient did well.  She  did not feel up to going home on the first postop day and was discharged  on the second postop day.   LABORATORY DATA:  Path report:  Leiomyomata; endometriosis in the  parametrial soft tissue, left side of the uterus; endometrium, benign  atrophic endometrium, no hyperplasia or malignancy; benign squamous  mucosa in the endocervical glands of the cervix.  Bilateral ovaries  unremarkable.  Bilateral fallopian tubes unremarkable.  The endometrial  cavity was distorted and had a few submucosal leiomyomata which were up  to 3.7 cm in diameter.  The myometrium was tan, pink, and smooth and  contained several intramural fibroids up to 1.1 cm in diameter.   Initial hemoglobin 12.8, hematocrit  37.2, white count 20,500, platelet  count 334,000, followup hematocrit 33.8.  Comprehensive metabolic  profile was normal.  A glucose was 104.   FINAL DIAGNOSES:  1. Persistent, recurrent postmenopausal bleeding.  2. Leiomyomata uteri.  3. Endometriosis of the left parametrial soft tissue.  4. Diabetes.  5. Hypercholesteremia.  6. Hypertension.   OPERATION:  Abdominal hysterectomy, bilateral salpingo-oophorectomy.   FINAL CONDITION:  Improved.   DISCHARGE INSTRUCTIONS:  Include our regular discharge instructions.  No  vaginal entrance.  No heavy lifting or strenuous activity.  Call with  any temperature elevation greater than 100.4 degrees.  Call with any  unusual  problems.  Percocet 5/325 thirty tablets 1 every 3-4 hours as needed for  pain is given at discharge.  The patient is to resume her medications.  She takes at home same dosage including simvastatin, metformin, Tevetan,  Valium, labetalol, verapamil, and hydrochlorothiazide.  She is to return  to the office in 3-4 days to have her staples removed.      Lucille Passy. Ulanda Edison, M.D.  Electronically Signed     TF[facility]/MEDQ  D:  02/02/2008  T:  02/03/2008  Job:  [id number]  cc:   Darrick Penna. Linna Darner, MD,FACP,FCCP  [place]  [place] [address]

## 2011-03-28 NOTE — Assessment & Plan Note (Signed)
[facility] OFFICE NOTE   MANIYA, CAUTHEN                         MRN:          [id number]  DATE:01/21/2007                            DOB:          [birth year]    Sheila Melton was seen January 21, 2007, for follow-up of her dyslipidemia  and diabetes.   Labs were reviewed with her and risks and options discussed.On  09/15/2006 her lipid profile revealed an HDL or good cholesterol was 39  and total cholesterol 189.  Triglycerides were 263 and VLDL was 53 and  LDL was 95.  Her A1c was 6.9.   She was told that her cardiovascular risk based on diabetes was  approximately 48% for premature cardiovascular event.  Recommendations  concerning carbohydrate restriction and a dietary program such as the  flat belly diet from [employer] were discussed.   The risk of continued smoking was discussed frankly.  It was felt that  this doubled or tripled her risk.   On 12/29/2006  her total cholesterol was 154, HDL  43, triglycerides  265, and LDL  70, which was at her goal.  Her A1c had dropped to 6.8.   Unfortunately she had not addressed her smoking & her only concerns were  possible adverse effects of Metformin & Vytorin. She questioned the  efficacy of the Vytorin although the LDL was the only risk factor  satisfactorily controlled. Also she questioned whether Metformin caused  depression.Additionally BP was elevated.I have recommended an  Endocrinology consult due to her lack of adherence & insight as to the  profoundly elevated cardiovascular risks.     Darrick Penna. Linna Darner, MD,FACP,FCCP  Electronically Signed    [facility]/MedQ  DD: 02/08/2007  DT: 02/09/2007  Job #: [id number]

## 2011-03-31 ENCOUNTER — Other Ambulatory Visit: Payer: Self-pay | Admitting: *Deleted

## 2011-03-31 MED ORDER — GLUCOSE BLOOD VI STRP: ORAL_STRIP | Status: DC

## 2011-04-03 ENCOUNTER — Other Ambulatory Visit: Payer: Self-pay | Admitting: *Deleted

## 2011-04-03 DIAGNOSIS — T887XXA Unspecified adverse effect of drug or medicament, initial encounter: Secondary | ICD-10-CM

## 2011-04-03 DIAGNOSIS — E785 Hyperlipidemia, unspecified: Secondary | ICD-10-CM

## 2011-04-04 ENCOUNTER — Other Ambulatory Visit (INDEPENDENT_AMBULATORY_CARE_PROVIDER_SITE_OTHER): Payer: Medicare Other

## 2011-04-04 ENCOUNTER — Other Ambulatory Visit (INDEPENDENT_AMBULATORY_CARE_PROVIDER_SITE_OTHER): Payer: Medicare Other | Admitting: Internal Medicine

## 2011-04-04 DIAGNOSIS — E785 Hyperlipidemia, unspecified: Secondary | ICD-10-CM

## 2011-04-04 DIAGNOSIS — T887XXA Unspecified adverse effect of drug or medicament, initial encounter: Secondary | ICD-10-CM

## 2011-04-04 DIAGNOSIS — Z13220 Encounter for screening for lipoid disorders: Secondary | ICD-10-CM

## 2011-04-04 DIAGNOSIS — IMO0001 Reserved for inherently not codable concepts without codable children: Secondary | ICD-10-CM

## 2011-04-04 LAB — HEPATIC FUNCTION PANEL
ALT: 19 U/L (ref 0–35)
AST: 20 U/L (ref 0–37)
Alkaline Phosphatase: 66 U/L (ref 39–117)
Bilirubin, Direct: 0.1 mg/dL (ref 0.0–0.3)
Total Bilirubin: 0.3 mg/dL (ref 0.3–1.2)

## 2011-04-04 LAB — MICROALBUMIN / CREATININE URINE RATIO
Creatinine,U: 36.6 mg/dL
Microalb Creat Ratio: 7.9 mg/g (ref 0.0–30.0)
Microalb, Ur: 2.9 mg/dL — ABNORMAL HIGH (ref 0.0–1.9)

## 2011-04-04 LAB — LIPID PANEL
Cholesterol: 214 mg/dL — ABNORMAL HIGH (ref 0–200)
Total CHOL/HDL Ratio: 5
VLDL: 55.4 mg/dL — ABNORMAL HIGH (ref 0.0–40.0)

## 2011-04-04 LAB — HEMOGLOBIN A1C: Hgb A1c MFr Bld: 6.8 % — ABNORMAL HIGH (ref 4.6–6.5)

## 2011-04-08 ENCOUNTER — Encounter: Payer: Self-pay | Admitting: Internal Medicine

## 2011-04-10 ENCOUNTER — Encounter: Payer: Self-pay | Admitting: Internal Medicine

## 2011-04-11 ENCOUNTER — Ambulatory Visit (INDEPENDENT_AMBULATORY_CARE_PROVIDER_SITE_OTHER): Payer: Medicare Other | Admitting: Internal Medicine

## 2011-04-11 ENCOUNTER — Encounter: Payer: Self-pay | Admitting: Internal Medicine

## 2011-04-11 DIAGNOSIS — E785 Hyperlipidemia, unspecified: Secondary | ICD-10-CM

## 2011-04-11 DIAGNOSIS — E119 Type 2 diabetes mellitus without complications: Secondary | ICD-10-CM

## 2011-04-11 NOTE — Progress Notes (Signed)
Subjective:    Patient ID: Sheila Melton, female    DOB: November 11, 1940, 70 y.o.   MRN: [id number]  HPI Diabetes status assessment: Fasting or morning glucose  average :  120s . Highest glucose 2 hours after any meal:  300 post ESI last month. Hypoglycemia :  no .                                                     Excess thirst, hunger, urination:  no.                                  Lightheadedness with standing:  no. Chest pain,Palpitations,  Pain in  calves with walking:  no .                                                                                                                                Non healing skin  ulcers or sores,especially over the feet:  no. Numbness or tingling or burning in feet : no .                                                                                                                                              Significant change in  Weight : no. Vision changes : no  .                                                                    Exercise : ADL . Nutrition/diet:  Decreased sweets. Medication compliance : yes. Medication adverse  Effects:  no . Eye exam : < 12 mos. Foot care : no.  A1c/ urine microalbumin monitor:  A1c 6.8%/2.9 , down from 7.2%/5.9.Risk discussed             Review of Systems     Objective:   Physical Exam Heart:  Normal rate and regular rhythm. S1 and S2 normal without gallop, click, rub or other extra sounds.Grade ! / 6 systolic murmur..Lungs:Chest clear to auscultation; no wheezes, rhonchi,rales ,or rubs present.No increased work of breathing.  All pulses intact ; L carotid  bruit .No ischemic skin changes.  Skin:  Intact without suspicious lesions or rashes . Ganglion L wrist. Lighyt touch normal over feet.        Assessment & Plan:  #1 diabetes, control improved  #2 dyslipidemia improved  Plan: Risks were discussed; avoidance of high fructose corn syrup was stressed to control the hypertriglyceridemia. Smoking  cessation was encouraged.

## 2011-04-11 NOTE — Patient Instructions (Signed)
Diabetes Monitor   The A1c test checks the average amount of glucose (sugar) in the blood over the last 2 to 3 months.As glucose circulates in the blood, some of it binds to hemoglobin A. This is the main form of hemoglobin in adults. Hemoglobin is a red protein that carries oxygen in the red blood cells (RBC's). Once the glucose is bound to the hemoglobin A, it remains there for the life of the red blood cell (about 120 days). This combination of glucose and hemoglobin A is called A1c (or hemoglobin A1c or glycohemoglobin). Increased glucose in the blood, increases the hemoglobin A1c. A1c levels do not change quickly but will shift as older RBC's die and younger ones take their place.  The A1c test is used primarily to monitor the glucose control of diabetics over time. The goal of those with diabetes is to keep their blood glucose levels as close to normal as possible. This helps to minimize the complications caused by chronically elevated glucose levels, such as progressive damage to body organs like the kidneys, eyes, cardiovascular system, and nerves. The A1c test gives a picture of the average amount of glucose in the blood over the last few months. It can help a patient and his doctor know if the measures they are taking to control the patient's diabetes are successful or need to be adjusted.    Depending on the type of diabetes that you have, how well your diabetes is controlled, your A1c may be measured 2 to 4 times each year. When someone is first diagnosed with diabetes or if control is not good, A1c may be ordered more frequently.   NORMAL VALUES  Non diabetic adults: 5 %-6.1%  Good diabetic control: 6.2-6.4 %  Fair diabetic control: 6.5-7%  Poor diabetic control: greater than 7 % ( except with additional factors such as  advanced age; significant coronary or neurologic disease,etc). Check the A1c every 6 months if it is < 6.5%; every 4 months if  6.5% or higher. Goals for home glucose  monitoring are : fasting  or morning glucose goal of  90-150. Two hours after any meal , goal = < 180, preferably < 150.    The most common cause of elevated triglycerides is the ingestion of sugar from high fructose corn syrup sources. You should consume less than 40 grams  of sugar per day from foods and drinks with high fructose corn syrup as number 2, 3, or #4 on the label. As TG go up, HDL or good cholesterol goes down. Also uric acid which causes gout will go up.  Check fasting lipids, A1c in 4 months (250.00, 272.4).    Please think about quitting smoking.It increases your risk of heart attack or stroke 2-3 times normal risk for your age. Please call 1-800-QUIT-NOW ([phone] [phone]) for free smoking cessation counseling. [employer]

## 2011-05-24 ENCOUNTER — Other Ambulatory Visit: Payer: Self-pay | Admitting: Internal Medicine

## 2011-05-26 NOTE — Telephone Encounter (Signed)
A1c 250.00 

## 2011-08-01 LAB — COMPREHENSIVE METABOLIC PANEL
AST: 20
Albumin: 3.9
Alkaline Phosphatase: 71
Chloride: 101
GFR calc Af Amer: >60
Potassium: 3.7
Total Bilirubin: 0.7
Total Protein: 7.2

## 2011-08-04 LAB — CBC
Hemoglobin: 12.8
MCHC: 34.5
MCHC: 34.6
MCV: 88.7
MCV: 89.4
Platelets: 323
RBC: 3.81 — ABNORMAL LOW
RBC: 4.16
RDW: 12.9
WBC: 14.9 — ABNORMAL HIGH

## 2011-08-08 ENCOUNTER — Other Ambulatory Visit: Payer: Self-pay | Admitting: Internal Medicine

## 2011-08-08 DIAGNOSIS — E785 Hyperlipidemia, unspecified: Secondary | ICD-10-CM

## 2011-08-08 DIAGNOSIS — E119 Type 2 diabetes mellitus without complications: Secondary | ICD-10-CM

## 2011-08-10 ENCOUNTER — Other Ambulatory Visit: Payer: Self-pay | Admitting: Internal Medicine

## 2011-08-11 ENCOUNTER — Other Ambulatory Visit (INDEPENDENT_AMBULATORY_CARE_PROVIDER_SITE_OTHER): Payer: Medicare Other

## 2011-08-11 DIAGNOSIS — E119 Type 2 diabetes mellitus without complications: Secondary | ICD-10-CM

## 2011-08-11 DIAGNOSIS — E785 Hyperlipidemia, unspecified: Secondary | ICD-10-CM

## 2011-08-11 LAB — LIPID PANEL
Cholesterol: 254 mg/dL — ABNORMAL HIGH (ref 0–200)
HDL: 44.3 mg/dL (ref >39.00)
Total CHOL/HDL Ratio: 6
Triglycerides: 318 mg/dL — ABNORMAL HIGH (ref 0.0–149.0)

## 2011-08-11 NOTE — Progress Notes (Signed)
Labs only

## 2011-08-14 ENCOUNTER — Other Ambulatory Visit: Payer: Self-pay | Admitting: Internal Medicine

## 2011-08-15 MED ORDER — DIAZEPAM 5 MG PO TABS: ORAL_TABLET | ORAL | Status: DC

## 2011-08-15 NOTE — Telephone Encounter (Signed)
RX sent

## 2011-08-23 ENCOUNTER — Other Ambulatory Visit: Payer: Self-pay | Admitting: Internal Medicine

## 2011-08-25 ENCOUNTER — Other Ambulatory Visit: Payer: Self-pay | Admitting: Internal Medicine

## 2011-08-25 DIAGNOSIS — Z1231 Encounter for screening mammogram for malignant neoplasm of breast: Secondary | ICD-10-CM

## 2011-08-28 ENCOUNTER — Encounter: Payer: Self-pay | Admitting: Internal Medicine

## 2011-08-28 ENCOUNTER — Ambulatory Visit (INDEPENDENT_AMBULATORY_CARE_PROVIDER_SITE_OTHER): Payer: Medicare Other | Admitting: Internal Medicine

## 2011-08-28 VITALS — BP 130/60 | HR 92 | Temp 98.7°F | Wt 159.0 lb

## 2011-08-28 DIAGNOSIS — E785 Hyperlipidemia, unspecified: Secondary | ICD-10-CM

## 2011-08-28 DIAGNOSIS — Z23 Encounter for immunization: Secondary | ICD-10-CM

## 2011-08-28 MED ORDER — PRAVASTATIN SODIUM 40 MG PO TABS: 40.0000 mg | ORAL_TABLET | Freq: Every evening | ORAL | Status: DC

## 2011-08-28 NOTE — Progress Notes (Signed)
  Subjective:    Patient ID: Sheila Melton, female    DOB: 1941-03-25, 70 y.o.   MRN: [id number]  HPI Labs were reviewed and risks discussed. Diabetes is adequately controlled at this time with an A1c of 6.7% .Seven months ago the A1c was 7.2. Triglycerides have increased from 277 to 318. As expected there is a slight decrease in HDL with a rise in triglycerides. She had been using herbal supplements to treat the hyperlipidemia. The LDL was 142.5; one year ago it  was well controlled at 76. The protective goals of at least less than 100 and ideally less than 70 were discussed.  She continues to smoke a pack a day. The 2-3 times increased risk for premature heart attack was discussed. Also the risk of accelerated emphysema was reviewed.  She's not been able to exercise. Options such as water or mixed or stationary bike/reclining bike were discussed.  FBS averages 150.      Review of Systems     Objective:   Physical Exam   She appears healthy and well-nourished: She is in no acute distress  Chest is clear without increased work of breathing, rales, rhonchi or wheezes. Breath sounds are slightly decreased  A left carotid bruit is noted. She states this is being monitored annually @ her cardiologist's office.  She has an S4 with a grade 1 systolic murmur.  Pedal pulses are decreased          Assessment & Plan:  #1 diabetes adequate control  #2 LDL goal not achieved  #3 smoking actively Plan: See orders and recommendations

## 2011-08-28 NOTE — Patient Instructions (Signed)
Eat a low-fat diet with lots of fruits and vegetables, up to 7-9 servings per day. Avoid obesity; your goal is waist measurement < 40 inches.Consume less than 40 grams of sugar per day from foods & drinks with High Fructose Corn Sugar as #1,2,3 or # 4 on label. Follow the low carb nutrition program in [place] as closely as possible to prevent Diabetes progression & complications. White carbohydrates (potatoes, rice, bread, and pasta) have a high spike of sugar and a high load of sugar. For example a  baked potato has a cup of sugar and a  french fry  2 teaspoons of sugar. Yams, wild  rice, whole grained bread &  wheat pasta have been much lower spike and load of  sugar. Portions should be the size of a deck of cards or your palm. Please  schedule fasting Labs in 10 weeks :CK, BMET,Lipids, hepatic panel, A1c.  Please bring these instructions to that Lab appt.

## 2011-09-08 ENCOUNTER — Ambulatory Visit
Admission: RE | Admit: 2011-09-08 | Discharge: 2011-09-08 | Disposition: A | Discharge status: Home / Self Care | Payer: Medicare Other | Source: Ambulatory Visit | Attending: Internal Medicine | Admitting: Internal Medicine

## 2011-09-08 DIAGNOSIS — Z1231 Encounter for screening mammogram for malignant neoplasm of breast: Secondary | ICD-10-CM

## 2011-10-04 ENCOUNTER — Other Ambulatory Visit: Payer: Self-pay | Admitting: Internal Medicine

## 2011-11-05 ENCOUNTER — Other Ambulatory Visit: Payer: Self-pay | Admitting: Internal Medicine

## 2011-11-05 DIAGNOSIS — E785 Hyperlipidemia, unspecified: Secondary | ICD-10-CM

## 2011-11-05 DIAGNOSIS — T887XXA Unspecified adverse effect of drug or medicament, initial encounter: Secondary | ICD-10-CM

## 2011-11-06 ENCOUNTER — Other Ambulatory Visit (INDEPENDENT_AMBULATORY_CARE_PROVIDER_SITE_OTHER): Payer: Medicare Other

## 2011-11-06 DIAGNOSIS — T887XXA Unspecified adverse effect of drug or medicament, initial encounter: Secondary | ICD-10-CM

## 2011-11-06 DIAGNOSIS — E785 Hyperlipidemia, unspecified: Secondary | ICD-10-CM

## 2011-11-06 LAB — LIPID PANEL
Cholesterol: 222 mg/dL — ABNORMAL HIGH (ref 0–200)
HDL: 40.7 mg/dL (ref >39.00)
Triglycerides: 317 mg/dL — ABNORMAL HIGH (ref 0.0–149.0)
VLDL: 63.4 mg/dL — ABNORMAL HIGH (ref 0.0–40.0)

## 2011-11-06 LAB — BASIC METABOLIC PANEL
GFR: 46.91 mL/min — ABNORMAL LOW (ref >60.00)
Potassium: 4.7 mEq/L (ref 3.5–5.1)
Sodium: 138 mEq/L (ref 135–145)

## 2011-11-06 LAB — HEPATIC FUNCTION PANEL
Albumin: 4.3 g/dL (ref 3.5–5.2)
Total Protein: 7.6 g/dL (ref 6.0–8.3)

## 2011-11-06 LAB — LDL CHOLESTEROL, DIRECT: Direct LDL: 117.7 mg/dL

## 2011-11-06 NOTE — Progress Notes (Signed)
LABS ONLY  

## 2011-11-24 ENCOUNTER — Other Ambulatory Visit: Payer: Self-pay | Admitting: Internal Medicine

## 2011-11-24 NOTE — Telephone Encounter (Signed)
Left message on voicemail informing patient med to be filled at pending appointment, if patient will run out prior, I will send in 30 day supply to local pharmacy.

## 2011-11-27 ENCOUNTER — Encounter: Payer: Self-pay | Admitting: Internal Medicine

## 2011-11-27 ENCOUNTER — Other Ambulatory Visit: Payer: Self-pay | Admitting: Internal Medicine

## 2011-11-27 ENCOUNTER — Ambulatory Visit (INDEPENDENT_AMBULATORY_CARE_PROVIDER_SITE_OTHER): Payer: Medicare Other | Admitting: Internal Medicine

## 2011-11-27 DIAGNOSIS — IMO0001 Reserved for inherently not codable concepts without codable children: Secondary | ICD-10-CM

## 2011-11-27 DIAGNOSIS — F17200 Nicotine dependence, unspecified, uncomplicated: Secondary | ICD-10-CM

## 2011-11-27 DIAGNOSIS — E785 Hyperlipidemia, unspecified: Secondary | ICD-10-CM

## 2011-11-27 DIAGNOSIS — I10 Essential (primary) hypertension: Secondary | ICD-10-CM

## 2011-11-27 DIAGNOSIS — G5600 Carpal tunnel syndrome, unspecified upper limb: Secondary | ICD-10-CM

## 2011-11-27 LAB — T4, FREE: Free T4: 0.78 ng/dL (ref 0.60–1.60)

## 2011-11-27 MED ORDER — GLUCOSE BLOOD VI STRP: ORAL_STRIP | Status: DC

## 2011-11-27 MED ORDER — FENOFIBRATE MICRONIZED 130 MG PO CAPS: 130.0000 mg | ORAL_CAPSULE | Freq: Every day | ORAL | Status: DC

## 2011-11-27 MED ORDER — LOSARTAN POTASSIUM 100 MG PO TABS: 100.0000 mg | ORAL_TABLET | Freq: Every day | ORAL | Status: DC

## 2011-11-27 MED ORDER — METFORMIN HCL 500 MG PO TABS: ORAL_TABLET | ORAL | Status: DC

## 2011-11-27 NOTE — Assessment & Plan Note (Signed)
LDL is not at goal at least less than 100. Triglycerides has stayed well over 250 and are now 317, over twice desirable range. She states she is reading labels. Unfortunately we'll have add a fibrate.

## 2011-11-27 NOTE — Assessment & Plan Note (Signed)
The risks to 3 times increase stroke or heart attack event related to smoking was discussed frankly. As noted the up front cost was also discussed.

## 2011-11-27 NOTE — Assessment & Plan Note (Signed)
Blood pressure is well controlled the her blood pressure medicine will be changed to losartan because of cost on her new plan

## 2011-11-27 NOTE — Progress Notes (Signed)
  Subjective:    Patient ID: Sheila Melton, female    DOB: September 30, 1941, 71 y.o.   MRN: [id number]  HPI HYPERTENSION: Disease Monitoring: Blood pressure range-not checked  Chest pain, palpitations- no       Dyspnea- no Medications: Compliance- yes Lightheadedness,Syncope- no    Edema- no  DIABETES: A1c 6.6% Disease Monitoring: Blood Sugar ranges-138-160+; 2 hrs post meal 220 Polyuria/phagia/dipsia- no      Visual problems- no Medications: Compliance- yes  Hypoglycemic symptoms-no  HYPERLIPIDEMIA: LDL 117.7 and HDL 40.7 on pravastatin. Disease Monitoring: See symptoms for Hypertension Medications: Compliance- yes; but her Diovan cost $175 for 3 months on her new plan. We discussed that the cigarettes cost over $150 a month at a pack a day.  Abd pain, bowel changes- no  Muscle aches- no        Review of Systems In 2012; she received an epidural steroid injection for lumbar radiculopathy. She describes back pain that radiates to the right groin suggesting L-1 radiculopathy.  She has numbness in her hands at night while sleeping.       Objective:   Physical Exam She appears  well-nourished; she is in no acute distress  L carotid bruit present. Thyroid normal  Heart rhythm and rate are normal with no  Gallops. Grade 1/6 systolic murmur   Chest is  SURPRISINGLY clear with no increased work of breathing  There is no evidence of aortic aneurysm or renal artery bruits  She has no  Edema. Slight clubbing. Ganglion L wrist   Pedal pulses are intact   No ischemic skin changes are present  R knee DTR decreased @ R knee; she laid back and sat up without help. Straight leg raising is negative          Assessment & Plan:   She describes an L1 radiculopathy and now is also having carpal tunnel symptoms. Thyroid function tests be checked. It if these are normal she'll need to use wrist splints at night while asleep

## 2011-11-27 NOTE — Assessment & Plan Note (Signed)
Diabetes control is now adequate with an A1c of 6.6% . Sugar average and risk related to A1c were discussed. Nutrition  intervention was also discussed.

## 2011-11-27 NOTE — Patient Instructions (Signed)
The most common cause of elevated triglycerides is the ingestion of sugar from high fructose corn syrup sources added to processed foods & drinks.  Eat a low-fat diet with lots of fruits and vegetables, up to 7-9 servings per day. Consume less than 30 (preferably ZERO) grams of sugar per day from foods & drinks with High Fructose Corn Syrup (HFCS) sugar as #1,2,3 or # 4 on label.[employer], [employer] do not carry products with HFCS. Follow a  low carb nutrition program such as [employer] or [employer]  to prevent Diabetes progression . White carbohydrates (potatoes, rice, bread, and pasta) have a high spike of sugar and a high load of sugar. For example a  baked potato has a cup of sugar and a  french fry  2 teaspoons of sugar. Yams, wild  rice, whole grained bread &  wheat pasta have been much lower spike and load of  sugar. Portions should be the size of a deck of cards or your palm.  Please  schedule fasting Labs in 10 weeks : BMET,Lipids, hepatic panel, CK. PLEASE BRING THESE INSTRUCTIONS TO FOLLOW UP  LAB APPOINTMENT.This will guarantee correct labs are drawn, eliminating need for repeat blood sampling ( needle sticks ! ). Diagnoses /Codes: 250.00, 272.4 Please think about quitting smoking. Review the risks we discussed. Please call 1-800-QUIT-NOW (276)355-6270) for free smoking cessation counseling OR consider  [facility]'s smoking cessation program @ www..com or (970)677-8049.

## 2011-11-27 NOTE — Telephone Encounter (Signed)
Rx sent 

## 2011-12-01 ENCOUNTER — Other Ambulatory Visit: Payer: Self-pay | Admitting: Internal Medicine

## 2011-12-01 MED ORDER — METFORMIN HCL 500 MG PO TABS: ORAL_TABLET | ORAL | Status: DC

## 2011-12-01 NOTE — Telephone Encounter (Signed)
RX re-sent manually

## 2011-12-02 ENCOUNTER — Other Ambulatory Visit: Payer: Medicare Other

## 2011-12-08 ENCOUNTER — Other Ambulatory Visit: Payer: Self-pay | Admitting: Internal Medicine

## 2011-12-08 MED ORDER — SPIRONOLACTONE 25 MG PO TABS: ORAL_TABLET | ORAL | Status: DC

## 2011-12-08 NOTE — Telephone Encounter (Signed)
RX sent

## 2011-12-11 ENCOUNTER — Telehealth: Payer: Self-pay | Admitting: Internal Medicine

## 2011-12-11 DIAGNOSIS — E785 Hyperlipidemia, unspecified: Secondary | ICD-10-CM

## 2011-12-11 NOTE — Telephone Encounter (Signed)
Patient states that she is out of fenofibrate. We sent refill to [facility] but patient states that she uses [facility] now. Would like refill of fenofibrate sent to local pharmacy to last her until she receives mail order.

## 2011-12-12 ENCOUNTER — Telehealth: Payer: Self-pay | Admitting: *Deleted

## 2011-12-12 MED ORDER — FENOFIBRATE MICRONIZED 130 MG PO CAPS: 130.0000 mg | ORAL_CAPSULE | Freq: Every day | ORAL | Status: DC

## 2011-12-12 MED ORDER — FENOFIBRATE MICRONIZED 134 MG PO CAPS: 134.0000 mg | ORAL_CAPSULE | Freq: Every day | ORAL | Status: DC

## 2011-12-12 NOTE — Telephone Encounter (Signed)
Pharmacy sent over a message stating alternative would be Fenofibrate 134mg , ? If ok to switch  Per Dr.Hopper ok to change, rx sent in. Med list was updated

## 2011-12-12 NOTE — Telephone Encounter (Signed)
Pt is unable to use discount card for Antara due having Medicare. Med will be about $70 out-of-pocket. Pt needs to be change to generic to lower cost of med. .Please advise

## 2011-12-12 NOTE — Telephone Encounter (Signed)
Absolutely #90, R X 1

## 2011-12-12 NOTE — Telephone Encounter (Signed)
Please check with your pharmacist to verify the cheapest option for this prescription hrough your plan. If a generic substitute is available; that would be the cheapest option. The medication may also be cheaper by mail order through your  plan or through the [place] drug plan.

## 2011-12-12 NOTE — Telephone Encounter (Signed)
Rx sent 

## 2011-12-12 NOTE — Telephone Encounter (Signed)
Pt is requesting generic fenofibrate. Please advise

## 2011-12-12 NOTE — Telephone Encounter (Signed)
To my knowledge there is no generic fenofibrate; she should  check with her pharmacist to verify that. Best option may be cheapest brand thru her plan via mail order

## 2011-12-12 NOTE — Telephone Encounter (Signed)
Patient called back stating that she is out of medication. [facility] on [address]

## 2011-12-16 ENCOUNTER — Other Ambulatory Visit: Payer: Self-pay | Admitting: Internal Medicine

## 2011-12-16 NOTE — Telephone Encounter (Signed)
This was sent in on 12/08/2011 to [facility], I called [facility], rx was received

## 2012-01-19 ENCOUNTER — Ambulatory Visit (INDEPENDENT_AMBULATORY_CARE_PROVIDER_SITE_OTHER): Payer: Medicare Other | Admitting: Internal Medicine

## 2012-01-19 ENCOUNTER — Encounter: Payer: Self-pay | Admitting: Internal Medicine

## 2012-01-19 VITALS — BP 160/62 | HR 83 | Temp 98.2°F | Ht 65.0 in | Wt 162.0 lb

## 2012-01-19 DIAGNOSIS — J069 Acute upper respiratory infection, unspecified: Secondary | ICD-10-CM

## 2012-01-19 MED ORDER — AZITHROMYCIN 250 MG PO TABS: ORAL_TABLET | ORAL | Status: AC

## 2012-01-19 MED ORDER — AZELASTINE HCL 0.1 % NA SOLN: 1.0000 | Freq: Two times a day (BID) | NASAL | Status: DC

## 2012-01-19 NOTE — Patient Instructions (Signed)
Rest, fluids , tylenol For cough, take Mucinex DM twice a day as needed  For congestion use astelin nasal spray x at least one week Take the antibiotic as prescribed, zithromax only if not better in 2-3 days Call if no better in few days Call anytime if the symptoms are severe

## 2012-01-19 NOTE — Progress Notes (Signed)
  Subjective:    Patient ID: Sheila Melton, female    DOB: July 13, 1941, 71 y.o.   MRN: [id number]  HPI Acute visit Symptoms started 4 days ago with runny nose, head congestion, hoarseness. She takes Allegra routinely, she added Mucinex without much help  Past Medical History  Diagnosis Date  . Subclavian steal syndrome   . Hyperlipidemia   . Diabetes mellitus     2  . Hypertension     unspecified essential      Review of Systems Subjective fever and chills. Mild. No chest congestion or cough, "I know is going to get a worse". no shortness of breath No nausea vomiting, and diarrhea for one time today. Also interestingly noticed a rash of the hands today, no itching or hurting.    Objective:   Physical Exam  Constitutional: She is oriented to person, place, and time. She appears well-developed and well-nourished. No distress.  HENT:  Head: Normocephalic and atraumatic.  Right Ear: External ear normal.  Left Ear: External ear normal.  Nose: Nose normal.  Mouth/Throat: No oropharyngeal exudate.       Face symmetric, no TTP  Cardiovascular: Normal rate, regular rhythm and normal heart sounds.   Pulmonary/Chest:       Few ronchi, no wheezing or increased WOB  Musculoskeletal: She exhibits no edema.  Neurological: She is alert and oriented to person, place, and time.  Skin: She is not diaphoretic.       Few red lesions, macular, no scally at the dorsum of the hands (R>L); no other lesions noted at face, neck , back          Assessment & Plan:  URI See instructions . Doubt rash related to URI, will call if worse

## 2012-01-21 ENCOUNTER — Ambulatory Visit: Payer: Medicare Other | Admitting: Internal Medicine

## 2012-02-05 ENCOUNTER — Telehealth: Payer: Self-pay | Admitting: Internal Medicine

## 2012-02-05 MED ORDER — LABETALOL HCL 300 MG PO TABS: ORAL_TABLET | ORAL | Status: DC

## 2012-02-05 NOTE — Telephone Encounter (Signed)
Refill: Labetalol tab 300mg . 1 PO BID. 90 day supply

## 2012-02-09 ENCOUNTER — Other Ambulatory Visit (INDEPENDENT_AMBULATORY_CARE_PROVIDER_SITE_OTHER): Payer: Medicare Other

## 2012-02-09 DIAGNOSIS — E785 Hyperlipidemia, unspecified: Secondary | ICD-10-CM

## 2012-02-09 DIAGNOSIS — E119 Type 2 diabetes mellitus without complications: Secondary | ICD-10-CM

## 2012-02-09 LAB — LIPID PANEL
Cholesterol: 188 mg/dL (ref 0–200)
Total CHOL/HDL Ratio: 4

## 2012-02-09 LAB — HEPATIC FUNCTION PANEL
AST: 29 U/L (ref 0–37)
Albumin: 4.4 g/dL (ref 3.5–5.2)
Alkaline Phosphatase: 51 U/L (ref 39–117)

## 2012-02-09 LAB — BASIC METABOLIC PANEL
BUN: 23 mg/dL (ref 6–23)
Chloride: 103 mEq/L (ref 96–112)
Creatinine, Ser: 1.4 mg/dL — ABNORMAL HIGH (ref 0.4–1.2)
GFR: 41.31 mL/min — ABNORMAL LOW (ref >60.00)

## 2012-02-18 ENCOUNTER — Telehealth: Payer: Self-pay | Admitting: Internal Medicine

## 2012-02-18 MED ORDER — DIAZEPAM 5 MG PO TABS: ORAL_TABLET | ORAL | Status: DC

## 2012-02-18 NOTE — Telephone Encounter (Signed)
RX sent

## 2012-02-18 NOTE — Telephone Encounter (Signed)
Refill: Diazepam 5mg  tab. Take 1/2 to 1 tablet by mouth as needed. Last fill 01-17-12. Qty 30

## 2012-02-27 ENCOUNTER — Encounter: Payer: Self-pay | Admitting: Internal Medicine

## 2012-02-27 ENCOUNTER — Telehealth: Payer: Self-pay | Admitting: Internal Medicine

## 2012-02-27 ENCOUNTER — Ambulatory Visit (INDEPENDENT_AMBULATORY_CARE_PROVIDER_SITE_OTHER): Payer: Medicare Other | Admitting: Internal Medicine

## 2012-02-27 VITALS — BP 144/70 | HR 83 | Wt 156.8 lb

## 2012-02-27 DIAGNOSIS — E785 Hyperlipidemia, unspecified: Secondary | ICD-10-CM

## 2012-02-27 DIAGNOSIS — F17200 Nicotine dependence, unspecified, uncomplicated: Secondary | ICD-10-CM

## 2012-02-27 DIAGNOSIS — I10 Essential (primary) hypertension: Secondary | ICD-10-CM

## 2012-02-27 MED ORDER — GLUCOSE BLOOD VI STRP: ORAL_STRIP | Status: DC

## 2012-02-27 MED ORDER — VALSARTAN 320 MG PO TABS: 320.0000 mg | ORAL_TABLET | Freq: Every day | ORAL | Status: DC

## 2012-02-27 NOTE — Progress Notes (Signed)
  Subjective:    Patient ID: Sheila Melton, female    DOB: Dec 24, 1940, 71 y.o.   MRN: [id number]  HPI  HYPERTENSION: Disease Monitoring: Blood pressure range-144-147/70s  Chest pain, palpitations- no       Dyspnea- no   Medications: Compliance- she is now  on 4 drugs for her blood pressure. The blood pressure has steadily risen since she was switched from Diovan to Losartan. Lightheadedness,Syncope- no    Edema- no  DIABETES: Disease Monitoring: Blood Sugar average X 30 days= 151  Polyuria/phagia/dipsia- no       Visual problems- no Medications: Compliance- she is on metformin 500 mg twice a day. Creatinine has risen from 1.2-1.4 despite glucose control and 4 antihypertensives as noted above  Hypoglycemic symptoms- no   HYPERLIPIDEMIA: Disease Monitoring: See symptoms for Hypertension Medications: Compliance- she is on fenofibrate in addition to pravastatin  Abd pain, bowel changes- back pain radiates to RLQ of  abdomen, Dr Nelva Bush evaluating this .Colonoscopy  one year ago apparently revealed colitis for which she received steroids. The pain she is having now is unlike the pain with colitis. Muscle aches- no; CK WNL          Review of Systems the back pain really is localized to the buttocks. She is to return to orthopedist for further evaluation near future. That discomfort is 70% better with the steroid shot.  She denies dysuria, hematuria, or pyuria.     Objective:   Physical Exam General appearance is one of good health and nourishment w/o distress.  Eyes: No conjunctival inflammation or scleral icterus is present.     Heart:  Normal rate and regular rhythm. S1 and S2 normal without gallop,  click, rub or other extra sounds  .Grade 1/6 systolic murmur   Vascular: Left carotid bruit present. No aortic aneurysm or renal artery bruits noted  Pedal pulses were present without ischemic change   Lungs:Chest clear to auscultation; no wheezes, rhonchi,rales ,or rubs  present.No increased work of breathing.   Abdomen: bowel sounds normal, soft and non-tender without masses, organomegaly or hernias noted.  No guarding or rebound   Skin:Warm & dry.  Intact without suspicious lesions or rashes ; no jaundice or tenting  Lymphatic: No lymphadenopathy is noted about the head, neck, axillary  areas.   Muscle skeletal: Strength and tone normal. Straight leg raising negative.  Neuro: Deep tendon reflexes normal and equal             Assessment & Plan:

## 2012-02-27 NOTE — Progress Notes (Signed)
Addended by: Douglass Rivers T on: 02/27/2012 12:31 PM   Modules accepted: Orders

## 2012-02-27 NOTE — Telephone Encounter (Signed)
Left message on voicemail informing patient labs to be rechecked in 6 weeks. I also advise on medicaiton's, Diovan was re-added

## 2012-02-27 NOTE — Assessment & Plan Note (Signed)
For the "umpteeth" time I stressed that stopping smoking is the most important intervention to prevent progression of her vascular disease and for blood pressure control.

## 2012-02-27 NOTE — Telephone Encounter (Signed)
6 weeks

## 2012-02-27 NOTE — Assessment & Plan Note (Signed)
Suboptimal control despite 4 drug use. Blood pressure has risen since she was switched from Diovan to losartan. Request will be made for switch back to Diovan. Spironolactone will be discontinued pending recheck of her creatinine

## 2012-02-27 NOTE — Assessment & Plan Note (Signed)
There is no significant improvement in her triglycerides and LDL is almost at goal. It should achieve goal with continued decrease in TG. No change in medicines recommended

## 2012-02-27 NOTE — Progress Notes (Signed)
Addended by: Douglass Rivers T on: 02/27/2012 12:35 PM   Modules accepted: Orders

## 2012-02-27 NOTE — Patient Instructions (Signed)
Consider  [facility]'s smoking cessation program @ [email or web address] or [phone].   Please  schedule fasting Labs : BMET,A1c. PLEASE BRING THESE INSTRUCTIONS TO FOLLOW UP  LAB APPOINTMENT.This will guarantee correct labs are drawn, eliminating need for repeat blood sampling ( needle sticks ! ). Diagnoses /Codes: 401.9, 250.00.  Your blood pressure is not adequately controlled despite 4 drugs. The Diovan has not been effective;I am  requesting Diovan 320 mg in attempt to control the blood pressure and prevent progressive kidney damage.

## 2012-02-27 NOTE — Telephone Encounter (Signed)
Pt came up to the front desk after her appt to schedule her labs. Pt sheet says schedule fasting labs, but pt states she believes she doesn't need them for 6 weeks. Should they be scheduled now or in 6 weeks? Also, patient stated she was confused about some of her medication changes and started to tear up. Please call pt to clarify.

## 2012-02-27 NOTE — Assessment & Plan Note (Signed)
Metformin will be held and nutritional intervention emphasized because of the increase in creatinine.

## 2012-02-27 NOTE — Telephone Encounter (Signed)
Dr.Hopper please advise when patient is to have labs for it is NOT listed in patient instructions

## 2012-03-01 ENCOUNTER — Telehealth: Payer: Self-pay | Admitting: *Deleted

## 2012-03-01 NOTE — Telephone Encounter (Signed)
Pt left VM that at last OV she was instructed to hold metformin however this make little sense to her since her last Hemoglobin A1C was 6.6 on 11-06-11. Left message to call office to advise Pt per last OV Dr Linna Darner indicated that Metformin will be held and nutritional intervention emphasized because of the increase in creatinine.

## 2012-03-02 NOTE — Telephone Encounter (Signed)
Discuss with patient  

## 2012-03-03 ENCOUNTER — Other Ambulatory Visit: Payer: Self-pay | Admitting: Internal Medicine

## 2012-03-08 ENCOUNTER — Telehealth: Payer: Self-pay | Admitting: *Deleted

## 2012-03-08 NOTE — Telephone Encounter (Signed)
Prior auth approved from 03-08-12-03-08-13 for tier exception. And from 03-08-12- indefinite for medication.Awaiting approval letter to be scan to chart.

## 2012-04-08 ENCOUNTER — Other Ambulatory Visit: Payer: Self-pay | Admitting: Physical Medicine and Rehabilitation

## 2012-04-08 DIAGNOSIS — N289 Disorder of kidney and ureter, unspecified: Secondary | ICD-10-CM

## 2012-04-09 ENCOUNTER — Other Ambulatory Visit (INDEPENDENT_AMBULATORY_CARE_PROVIDER_SITE_OTHER): Payer: Medicare Other

## 2012-04-09 ENCOUNTER — Ambulatory Visit
Admission: RE | Admit: 2012-04-09 | Discharge: 2012-04-09 | Disposition: A | Discharge status: Home / Self Care | Payer: Medicare Other | Source: Ambulatory Visit | Attending: Physical Medicine and Rehabilitation | Admitting: Physical Medicine and Rehabilitation

## 2012-04-09 DIAGNOSIS — N289 Disorder of kidney and ureter, unspecified: Secondary | ICD-10-CM

## 2012-04-09 DIAGNOSIS — I10 Essential (primary) hypertension: Secondary | ICD-10-CM

## 2012-04-09 DIAGNOSIS — E119 Type 2 diabetes mellitus without complications: Secondary | ICD-10-CM

## 2012-04-09 LAB — BASIC METABOLIC PANEL
BUN: 19 mg/dL (ref 6–23)
CO2: 26 mEq/L (ref 19–32)
Chloride: 103 mEq/L (ref 96–112)
GFR: 48.71 mL/min — ABNORMAL LOW (ref >60.00)
Glucose, Bld: 134 mg/dL — ABNORMAL HIGH (ref 70–99)
Potassium: 3.4 mEq/L — ABNORMAL LOW (ref 3.5–5.1)

## 2012-04-09 NOTE — Progress Notes (Signed)
Labs only

## 2012-04-19 ENCOUNTER — Telehealth: Payer: Self-pay | Admitting: Internal Medicine

## 2012-04-19 ENCOUNTER — Encounter: Payer: Self-pay | Admitting: *Deleted

## 2012-04-19 NOTE — Telephone Encounter (Signed)
Please call with lab results from 5.31.13 Ph# [phone]

## 2012-04-19 NOTE — Telephone Encounter (Signed)
Discuss with patient, copy of labs mailed.  To increase Potassium (K+) increase citrus fruits & bananas in diet and use the salt substitute No Salt, which contains potassium , to season food @ the table. Recheck K+ after 4 weeks.PLEASE BRING THESE INSTRUCTIONS TO FOLLOW UP LAB APPOINTMENT.This will guarantee correct labs are drawn, eliminating need for repeat blood sampling ( needle sticks ! ). Diagnoses /Codes: 276.8 . I saw no medications in your med list which should cause potassium loss. Please bring all actual pill bottles to the next office visit. Monitor the A1c every 4 months as it is over 6.5%. [employer]

## 2012-05-07 ENCOUNTER — Other Ambulatory Visit (INDEPENDENT_AMBULATORY_CARE_PROVIDER_SITE_OTHER): Payer: Medicare Other

## 2012-05-07 DIAGNOSIS — E876 Hypokalemia: Secondary | ICD-10-CM

## 2012-05-07 NOTE — Progress Notes (Signed)
Labs only

## 2012-05-18 ENCOUNTER — Encounter: Payer: Medicare Other | Admitting: Vascular Surgery

## 2012-05-20 ENCOUNTER — Encounter: Payer: Self-pay | Admitting: Internal Medicine

## 2012-05-20 ENCOUNTER — Ambulatory Visit (INDEPENDENT_AMBULATORY_CARE_PROVIDER_SITE_OTHER): Payer: Medicare Other | Admitting: Internal Medicine

## 2012-05-20 VITALS — BP 114/66 | HR 86 | Wt 153.2 lb

## 2012-05-20 DIAGNOSIS — I739 Peripheral vascular disease, unspecified: Secondary | ICD-10-CM

## 2012-05-20 DIAGNOSIS — I129 Hypertensive chronic kidney disease with stage 1 through stage 4 chronic kidney disease, or unspecified chronic kidney disease: Secondary | ICD-10-CM | POA: Insufficient documentation

## 2012-05-20 DIAGNOSIS — N289 Disorder of kidney and ureter, unspecified: Secondary | ICD-10-CM

## 2012-05-20 DIAGNOSIS — I714 Abdominal aortic aneurysm, without rupture, unspecified: Secondary | ICD-10-CM

## 2012-05-20 DIAGNOSIS — E876 Hypokalemia: Secondary | ICD-10-CM

## 2012-05-20 DIAGNOSIS — N281 Cyst of kidney, acquired: Secondary | ICD-10-CM

## 2012-05-20 DIAGNOSIS — Q619 Cystic kidney disease, unspecified: Secondary | ICD-10-CM

## 2012-05-20 DIAGNOSIS — I10 Essential (primary) hypertension: Secondary | ICD-10-CM

## 2012-05-20 DIAGNOSIS — F17200 Nicotine dependence, unspecified, uncomplicated: Secondary | ICD-10-CM

## 2012-05-20 MED ORDER — VALSARTAN 320 MG PO TABS: ORAL_TABLET | ORAL | Status: DC

## 2012-05-20 MED ORDER — SPIRONOLACTONE 25 MG PO TABS: 25.0000 mg | ORAL_TABLET | Freq: Every day | ORAL | Status: DC

## 2012-05-20 NOTE — Assessment & Plan Note (Addendum)
Assessment of peripheral circulation will be requested of the vascular specialty clinic when she is seen to monitor the aortic aneurysm. It would be important to differentiate neurogenic claudication from vascular claudication component

## 2012-05-20 NOTE — Assessment & Plan Note (Signed)
The renal cyst do not look pathologic; no further evaluation is necessary at this time

## 2012-05-20 NOTE — Assessment & Plan Note (Signed)
We discussed the risks associated with continued to smoke particularly in reference to the peripheral vascular disease, hypertension, and aortic aneurysm. I have asked her to consider the electronic cigarette.

## 2012-05-20 NOTE — Patient Instructions (Addendum)
PLEASE BRING THESE INSTRUCTIONS TO FOLLOW UP  LAB APPOINTMENT in 4-6 weeks for fasting BMET.This will guarantee correct labs are drawn, eliminating need for repeat blood sampling ( needle sticks ! ). Diagnoses /Codes: U8646187.9

## 2012-05-20 NOTE — Progress Notes (Signed)
  Subjective:    Patient ID: Sheila Melton, female    DOB: [birth year], 71 y.o.   MRN: [id number]  HPI Blood pressure is well controlled at home with an average of 130/67. She denies chest pain or palpitations. She does have claudication type symptoms especially in the right leg. She also has spinal stenosis with possible neurogenic claudication symptoms. She does have a past history of subclavian steal syndrome. She continues to be an active smoker.  Her potassium is now 3.1  She has an upcoming appointment with cardiovascular thoracic surgeons to evaluate the 3.6 cm aneurysm found on renal ultrasound.    Review of Systems  An MRI was done by Dr. Nelva Bush to evaluate the back symptoms. Incidental finding was a renal lesion. Ultrasound demonstrates cysts bilaterally. They do not appear pathologic     Objective:   Physical Exam She appears healthy and well-nourished; she is in no acute distress  No carotid bruits are present.  Heart rhythm and rate are normal .Grade 1/6 systolic murmur   Chest is clear with no increased work of breathing. BS decreased  I can appreciate an  aortic aneurysm or renal artery bruits  She has no clubbing or edema. Large ganglion left ventral wrist   Pedal pulses are intact but dorsalis pedis pulses are decreased  No ischemic skin changes are present         Assessment & Plan:

## 2012-05-20 NOTE — Assessment & Plan Note (Signed)
Blood pressure is well controlled at this time but hypokalemia is an issue. The Diovan 320 will be decreased to one half pill daily and spironolactone restarted. Renal function should be rechecked in 4-6 weeks. With aortic aneurysm blood pressure control at a level of less than 135/85 as a minimum would be desirable

## 2012-05-24 ENCOUNTER — Encounter: Payer: Self-pay | Admitting: Vascular Surgery

## 2012-05-25 ENCOUNTER — Encounter: Payer: Self-pay | Admitting: Vascular Surgery

## 2012-05-25 ENCOUNTER — Ambulatory Visit (INDEPENDENT_AMBULATORY_CARE_PROVIDER_SITE_OTHER): Payer: Medicare Other | Admitting: Vascular Surgery

## 2012-05-25 VITALS — BP 154/61 | HR 81 | Temp 98.1°F | Ht 65.0 in | Wt 154.0 lb

## 2012-05-25 DIAGNOSIS — I714 Abdominal aortic aneurysm, without rupture, unspecified: Secondary | ICD-10-CM | POA: Insufficient documentation

## 2012-05-25 NOTE — Progress Notes (Signed)
[facility]   Vascular Consult Note    Patient name: Sheila Melton MRN: [id number] DOB: Oct 19, 1941 Sex: female   Referred by: Nelva Bush  Reason for referral: AAA  HISTORY OF PRESENT ILLNESS: The patient is a very pleasant 71 year old with a known history of abdominal aortic aneurysm. This was initially discovered on MRI looking for degenerative disc disease and she recently had an ultrasound looking at a renal mass that showed slight increase in size to 3.7 cm. She has no symptoms referable to this he has no family history of aneurysms. She does have hypertension which is controlled and not his diabetes.  Past Medical History  Diagnosis Date  . Subclavian steal syndrome   . Hyperlipidemia   . Diabetes mellitus     2  . Hypertension     unspecified essential    Past Surgical History  Procedure Date  . Dilation and curettage of uterus   . Cataract extraction     OS  . G1 p1   . Abdominal hysterectomy     History   Social History  . Marital Status: Married    Spouse Name: N/A    Number of Children: N/A  . Years of Education: N/A   Occupational History  . Not on file.   Social History Main Topics  . Smoking status: Current Everyday Smoker -- 1.0 packs/day for 50 years    Types: Cigarettes  . Smokeless tobacco: Never Used  . Alcohol Use: No  . Drug Use: No  . Sexually Active: Not on file   Other Topics Concern  . Not on file   Social History Narrative   Has 3 caffeinated bev a day    Family History  Problem Relation Age of Onset  . Coronary artery disease    . Diabetes    . Heart failure Mother   . Heart disease Mother   . Hypertension Mother   . Other Mother     varicose veins  . Hypertension Sister     X 2  . Diabetes Sister   . Hyperlipidemia Sister   . Other Sister     varicose veins  . Heart disease Father   . Diabetes Father   . Hyperlipidemia Father   . Hypertension Father   . Heart attack Father   .  Heart disease Brother     MI in late 77s  . Hyperlipidemia Brother   . Heart attack Brother   . Parkinsonism Brother   . Hypertension Daughter     Allergies  Allergen Reactions  . Amoxicillin     REACTION: rash  . Captopril     REACTION: cough  . Simvastatin     REACTION: buttocks cramps    Prior to Admission medications   Medication Sig Start Date End Date Taking? Authorizing Provider  AMBULATORY NON FORMULARY MEDICATION Medication Name: Glucobetic Herb, 1 by mouth twice daily   Yes Historical Provider, MD  amLODipine (NORVASC) 5 MG tablet TAKE 1 TABLET TWICE A DAY 10/04/11  Yes Hendricks Limes, MD  B Complex-C (SUPER B COMPLEX PO) Take by mouth daily.   Yes Historical Provider, MD  Cholecalciferol (VITAMIN D3) 2000 UNITS TABS Take by mouth daily.   Yes Historical Provider, MD  diazepam (VALIUM) 5 MG tablet 1/2-1 by mouth as needed 02/18/12  Yes Hendricks Limes, MD  fenofibrate micronized (LOFIBRA) 134 MG capsule TAKE 1 CAPSULE BY MOUTH BEFORE BREAKFAST 03/03/12  Yes Hendricks Limes, MD  fexofenadine (ALLEGRA) 180 MG tablet Take 180 mg by mouth as needed.     Yes Historical Provider, MD  glucose blood (ONE TOUCH ULTRA TEST) test strip Three times daily. 02/27/12 02/26/13 Yes Hendricks Limes, MD  ibuprofen (ADVIL,MOTRIN) 200 MG tablet Take 200 mg by mouth. Up to 6 pills daily    Yes Historical Provider, MD  labetalol (NORMODYNE) 300 MG tablet TAKE 1 TABLET TWICE A DAY 02/05/12  Yes Hendricks Limes, MD  pravastatin (PRAVACHOL) 40 MG tablet Take 1 tablet (40 mg total) by mouth every evening. 08/28/11 08/27/12 Yes Hendricks Limes, MD  spironolactone (ALDACTONE) 25 MG tablet Take 1 tablet (25 mg total) by mouth daily. 05/20/12 05/20/13 Yes Hendricks Limes, MD  valsartan (DIOVAN) 320 MG tablet One half pill daily 05/20/12  Yes Hendricks Limes, MD  vitamin C (ASCORBIC ACID) 500 MG tablet Take 500 mg by mouth daily.   Yes Historical Provider, MD  vitamin E 400 UNIT capsule Take 400 Units  by mouth daily.   Yes Historical Provider, MD     REVIEW OF SYSTEMS: Cardiovascular: No chest pain, chest pressure, palpitations, orthopnea, or dyspnea on exertion. No claudication or rest pain,  No history of DVT or phlebitis. Does have a history of varicose veins Pulmonary: No productive cough, asthma or wheezing. Neurologic: No weakness, paresthesias, aphasia, or amaurosis. No dizziness. Hematologic: No bleeding problems or clotting disorders. Musculoskeletal: No joint pain or joint swelling. Gastrointestinal: No blood in stool or hematemesis Genitourinary: No dysuria or hematuria. Psychiatric:: No history of major depression. Integumentary: No rashes or ulcers. Constitutional: No fever or chills.  PHYSICAL EXAMINATION:  Filed Vitals:   05/25/12 0926  BP: 154/61  Pulse: 81  Temp: 98.1 F (36.7 C)    General: The patient appears their stated age. Pulmonary: There is a good air exchange bilaterally without wheezing or rales. Abdomen: Soft and non-tender with normal pitch bowel sounds. I do not palpate an abdominal aortic aneurysm Musculoskeletal: There are no major deformities.  There is no significant extremity pain. Neurologic: No focal weakness or paresthesias are detected, Skin: There are no ulcer or rashes noted. Psychiatric: The patient has normal affect. Cardiovascular: There is a regular rate and rhythm without significant murmur appreciated. Pulse status 2+ radial pulses bilaterally 2+ femoral pulses bilaterally, 2+ right dorsalis pedis pulse and 1+ left dorsalis pedis pulse    Outside Studies/Documentation Historical records were reviewed.  They showed ultrasound from May 2013 reviewed with maximal diameter of 3.7 cm  Medication Changes: None  Assessment:  Asymptomatic small infrarenal abdominal aortic aneurysm  Plan: Had a long discussion with the patient and her husband present. I explained the significance over aneurysm and this nature of the small size  of this. I did explain the natural history of continued slow growth. I did explain symptoms of leaking aneurysm explained this would be extremely unlikely at the current size. I did discuss open and stent graft repair of aneurysms. I explained that she would need to have any limitations to or activity but would need to continue blood pressure control. We will see her again in one year with repeat ultrasound to rule out expansion.  EARLY, TODD 7/16/201310:08 AM

## 2012-06-21 ENCOUNTER — Telehealth: Payer: Self-pay | Admitting: Internal Medicine

## 2012-06-21 MED ORDER — AMLODIPINE BESYLATE 5 MG PO TABS: ORAL_TABLET | ORAL | Status: DC

## 2012-06-21 NOTE — Telephone Encounter (Signed)
Refill: Amlodipine 5mg  tab. Take 1 tablet twice a day. 90 day supply

## 2012-06-24 ENCOUNTER — Other Ambulatory Visit (INDEPENDENT_AMBULATORY_CARE_PROVIDER_SITE_OTHER): Payer: Medicare Other

## 2012-06-24 DIAGNOSIS — I10 Essential (primary) hypertension: Secondary | ICD-10-CM

## 2012-06-24 LAB — BASIC METABOLIC PANEL
GFR: 41.62 mL/min — ABNORMAL LOW (ref >60.00)
Glucose, Bld: 103 mg/dL — ABNORMAL HIGH (ref 70–99)
Potassium: 4.1 mEq/L (ref 3.5–5.1)
Sodium: 136 mEq/L (ref 135–145)

## 2012-06-24 NOTE — Progress Notes (Signed)
Labs only

## 2012-06-29 ENCOUNTER — Ambulatory Visit (INDEPENDENT_AMBULATORY_CARE_PROVIDER_SITE_OTHER): Payer: Medicare Other | Admitting: Internal Medicine

## 2012-06-29 ENCOUNTER — Encounter: Payer: Self-pay | Admitting: Internal Medicine

## 2012-06-29 VITALS — BP 136/78 | HR 86 | Temp 98.6°F | Wt 157.0 lb

## 2012-06-29 DIAGNOSIS — N289 Disorder of kidney and ureter, unspecified: Secondary | ICD-10-CM

## 2012-06-29 DIAGNOSIS — IMO0001 Reserved for inherently not codable concepts without codable children: Secondary | ICD-10-CM

## 2012-06-29 NOTE — Patient Instructions (Addendum)
A1c assesses average 24 hour  glucose over prior 6-12 weeks. A1c GOALS:  Good diabetic control: 6.5-7 %  Fair diabetic control: 7-8 %  Poor diabetic control: greater than 8 % ( except with additional factors such as  advanced age; significant coronary or neurologic disease,etc). Check the A1c every 6 months if it is < 6.5%; every 4 months if  6.5% or higher. Goals for home glucose monitoring are : fasting  or morning glucose goal of  100-150. Two hours after any meal , goal = < 180, preferably < 160. Report any low blood glucoses immediately. Urine microalbumin assesses  microscopic kidney disease from hypertension or Diabetes; Diabetes & Hypertension control will reverse any elevation. Eat a low-fat diet with lots of fruits and vegetables, up to 7-9 servings per day. Consume less than 30 (preferably ZERO) grams of sugar per day from foods & drinks with High Fructose Corn Syrup (HFCS) sugar as #1,2,3 or # 4 on label.[employer], [employer] do not carry products with HFCS. Follow a  low carb nutrition program such as [place] or [employer]  to prevent Diabetes progression . White carbohydrates (potatoes, rice, bread, and pasta) have a high spike of sugar and a high load of sugar. For example a  baked potato has a cup of sugar and a  french fry  2 teaspoons of sugar. Yams, wild  rice, whole grained bread &  wheat pasta have been much lower spike and load of  sugar. Portions should be the size of a deck of cards or your palm.

## 2012-06-29 NOTE — Progress Notes (Signed)
  Subjective:    Patient ID: Sheila Melton, female    DOB: 1941-07-03, 71 y.o.   MRN: [id number]  HPI Yesterday 4 hours after a meal her blood sugar was 258; it was over 300 weeks ago following an epidural steroid injection. Benefit from epidural steroids lasted 2 weeks. Back surgery is planned.  In the last 6 weeks her fasting blood sugars have ranged from a low of 102 to a high in  150s. 2 hours after any mea lhigh has been  250 or  above.  In April of this year creatinine was 1.4 and GFR 41.31 prompting discontinuation of metformin. Her last A1c was 6.6% on 04/09/12. She is not on a specific diet at this time, but watches carbs.    Review of Systems She denies polyuria, polydipsia, or polyphagia. Weight has been stable     Objective:   Physical Exam She appears well-nourished; she is in no acute distress related to her back pain. She is able to lie back and setup but has is uncomfortable doing so.  Left carotid bruit is present  She is a grade [id number] systolic murmur  Chest is clear without abnormal breath sounds. She has no increased work of breathing.  No periumbilical bruits were noted  Pedal pulses are intact.  Skin reveals no significant lesions        Assessment & Plan:  #1 diabetes exacerbation due to epidural steroids  Plan: See orders and recommendations. Were she to continue epidural steroids sliding-scale insulin would be employed peri-procedure.

## 2012-06-30 LAB — POTASSIUM: Potassium: 4.4 mEq/L (ref 3.5–5.1)

## 2012-06-30 LAB — MICROALBUMIN / CREATININE URINE RATIO
Creatinine,U: 25.9 mg/dL
Microalb Creat Ratio: 17.4 mg/g (ref 0.0–30.0)
Microalb, Ur: 4.5 mg/dL — ABNORMAL HIGH (ref 0.0–1.9)

## 2012-06-30 LAB — HEMOGLOBIN A1C: Hgb A1c MFr Bld: 7.1 % — ABNORMAL HIGH (ref 4.6–6.5)

## 2012-07-11 HISTORY — PX: SPINE SURGERY: SHX786

## 2012-07-13 ENCOUNTER — Telehealth: Payer: Self-pay | Admitting: Internal Medicine

## 2012-07-13 DIAGNOSIS — I10 Essential (primary) hypertension: Secondary | ICD-10-CM

## 2012-07-13 MED ORDER — VALSARTAN 320 MG PO TABS: ORAL_TABLET | ORAL | Status: DC

## 2012-07-13 NOTE — Telephone Encounter (Signed)
Refill diovan an 320mg  take one tablet by mouth daily  Requesting 90---Last wrt 7.11.13 #90 wt/1-refill Last ov 8.20.13 f/u

## 2012-07-13 NOTE — Telephone Encounter (Signed)
Left message on Voicemail informing patient rx sent over for #45 as instructions states 1/2 pill daily

## 2012-07-16 ENCOUNTER — Other Ambulatory Visit: Payer: Self-pay | Admitting: Neurosurgery

## 2012-07-19 ENCOUNTER — Encounter (HOSPITAL_COMMUNITY): Payer: Self-pay | Admitting: Pharmacy Technician

## 2012-07-22 ENCOUNTER — Encounter (HOSPITAL_COMMUNITY)
Admission: RE | Admit: 2012-07-22 | Discharge: 2012-07-22 | Disposition: A | Discharge status: Home / Self Care | Payer: Medicare Other | Source: Ambulatory Visit | Attending: Neurosurgery | Admitting: Neurosurgery

## 2012-07-22 ENCOUNTER — Encounter (HOSPITAL_COMMUNITY): Payer: Self-pay

## 2012-07-22 ENCOUNTER — Ambulatory Visit (HOSPITAL_COMMUNITY)
Admission: RE | Admit: 2012-07-22 | Discharge: 2012-07-22 | Disposition: A | Discharge status: Home / Self Care | Payer: Medicare Other | Source: Ambulatory Visit | Attending: Neurosurgery | Admitting: Neurosurgery

## 2012-07-22 DIAGNOSIS — Z01812 Encounter for preprocedural laboratory examination: Secondary | ICD-10-CM | POA: Insufficient documentation

## 2012-07-22 DIAGNOSIS — Z01818 Encounter for other preprocedural examination: Secondary | ICD-10-CM | POA: Insufficient documentation

## 2012-07-22 HISTORY — DX: Cardiac murmur, unspecified: R01.1

## 2012-07-22 HISTORY — DX: Family history of other specified conditions: Z84.89

## 2012-07-22 HISTORY — DX: Abdominal aortic aneurysm, without rupture, unspecified: I71.40

## 2012-07-22 HISTORY — DX: Unspecified osteoarthritis, unspecified site: M19.90

## 2012-07-22 HISTORY — DX: Abdominal aortic aneurysm, without rupture: I71.4

## 2012-07-22 LAB — BASIC METABOLIC PANEL
CO2: 26 mEq/L (ref 19–32)
Chloride: 97 mEq/L (ref 96–112)
Creatinine, Ser: 1.5 mg/dL — ABNORMAL HIGH (ref 0.50–1.10)
Glucose, Bld: 173 mg/dL — ABNORMAL HIGH (ref 70–99)

## 2012-07-22 LAB — CBC WITH DIFFERENTIAL/PLATELET
Eosinophils Relative: 6 % — ABNORMAL HIGH (ref 0–5)
HCT: 37.3 % (ref 36.0–46.0)
Lymphs Abs: 3.9 10*3/uL (ref 0.7–4.0)
MCH: 29.8 pg (ref 26.0–34.0)
MCV: 89.7 fL (ref 78.0–100.0)
Monocytes Absolute: 1.2 10*3/uL — ABNORMAL HIGH (ref 0.1–1.0)
Monocytes Relative: 11 % (ref 3–12)
Neutro Abs: 4.8 10*3/uL (ref 1.7–7.7)
RDW: 13.8 % (ref 11.5–15.5)
WBC: 10.6 10*3/uL — ABNORMAL HIGH (ref 4.0–10.5)

## 2012-07-22 LAB — ABO/RH: ABO/RH(D): O POS

## 2012-07-22 LAB — SURGICAL PCR SCREEN: Staphylococcus aureus: NEGATIVE

## 2012-07-22 NOTE — Pre-Procedure Instructions (Signed)
[facility]  07/22/2012   Your procedure is scheduled on:  Monday  07/26/12   Report to [place] at 530 AM.  Call this number if you have problems the morning of surgery: [phone]   Remember:   Do not eat food OR DRINK :After Midnight.   Take these medicines the morning of surgery with A SIP OF WATER: NORVASC,  HYDROCODONE OR OXYCODONE   Do not wear jewelry, make-up or nail polish.  Do not wear lotions, powders, or perfumes. You may wear deodorant.  Do not shave 48 hours prior to surgery. Men may shave face and neck.  Do not bring valuables to the hospital.  Contacts, dentures or bridgework may not be worn into surgery.  Leave suitcase in the car. After surgery it may be brought to your room.  For patients admitted to the hospital, checkout time is 11:00 AM the day of discharge.   Patients discharged the day of surgery will not be allowed to drive home.  Name and phone number of your driver:   Special Instructions: CHG Shower Use Special Wash: 1/2 bottle night before surgery and 1/2 bottle morning of surgery.   Please read over the following fact sheets that you were given: Pain Booklet, Coughing and Deep Breathing, Blood Transfusion Information, MRSA Information and Surgical Site Infection Prevention

## 2012-07-23 NOTE — Consult Note (Signed)
Anesthesia chart review: Patient is a 71 year old female scheduled for L3-4, L4-5 posterior lumbar interbody fusion by Dr. Annette Stable on 07/26/2012. History includes smoking, hypertension, hyperlipidemia, subclavian steal syndrome, diabetes mellitus type 2, abdominal aortic aneurysm (3.7 cm in May 2013 with yearly follow-up recommended by Dr. Curt Jews).  PCP is Dr. Unice Cobble.    Labs noted.  Cr 1.50 (Cr 1.2-1.5 between 04/09/12 and 06/29/12).  She had a negative chest x-ray on 07/22/2012.  EKG on 07/22/12 showed NSR, cannot rule out anterior infarct (age undetermined).  EKG appears stable since at least 12/30/07 (under ECG tab).  Echo on 05/19/08 showed: - Overall left ventricular systolic function was normal. Left ventricular ejection fraction was estimated , range being 55 % to 65 %. There were no left ventricular regional wall motion abnormalities. Left ventricular wall thickness was mildly increased. - Aortic valve thickness was mildly increased. - There was mild mitral valvular regurgitation. - Trivial tricuspid valvular regurgitation   By notes, she had 0-39% bilateral ICA stenosis, patent vertebral arteries, and triphasic bilateral brachial artery waveforms by carotid duplex on 05/19/08.  If not acute CV symptoms, then anticipate she can proceed as planned.  Myra Gianotti, PA-C

## 2012-07-25 MED ORDER — VANCOMYCIN HCL 500 MG IV SOLR
500.0000 mg | INTRAVENOUS | Status: AC
  Administered 2012-07-26: 500 mg via INTRAVENOUS
  Filled 2012-07-25: qty 500

## 2012-07-26 ENCOUNTER — Encounter (HOSPITAL_COMMUNITY)
Admission: RE | Disposition: A | Discharge status: Home / Self Care | Payer: Self-pay | Source: Ambulatory Visit | Attending: Neurosurgery

## 2012-07-26 ENCOUNTER — Encounter (HOSPITAL_COMMUNITY): Payer: Self-pay | Admitting: Neurosurgery

## 2012-07-26 ENCOUNTER — Encounter (HOSPITAL_COMMUNITY): Payer: Self-pay | Admitting: Vascular Surgery

## 2012-07-26 ENCOUNTER — Inpatient Hospital Stay (HOSPITAL_COMMUNITY): Payer: Medicare Other | Admitting: Vascular Surgery

## 2012-07-26 ENCOUNTER — Inpatient Hospital Stay (HOSPITAL_COMMUNITY)
Admission: RE | Admit: 2012-07-26 | Discharge: 2012-07-28 | DRG: 460 | Disposition: A | Discharge status: Home / Self Care | Payer: Medicare Other | Source: Ambulatory Visit | Attending: Neurosurgery | Admitting: Neurosurgery

## 2012-07-26 ENCOUNTER — Inpatient Hospital Stay (HOSPITAL_COMMUNITY): Payer: Medicare Other

## 2012-07-26 DIAGNOSIS — M4807 Spinal stenosis, lumbosacral region: Secondary | ICD-10-CM | POA: Diagnosis present

## 2012-07-26 DIAGNOSIS — Z8249 Family history of ischemic heart disease and other diseases of the circulatory system: Secondary | ICD-10-CM

## 2012-07-26 DIAGNOSIS — I714 Abdominal aortic aneurysm, without rupture, unspecified: Secondary | ICD-10-CM | POA: Diagnosis present

## 2012-07-26 DIAGNOSIS — E876 Hypokalemia: Secondary | ICD-10-CM

## 2012-07-26 DIAGNOSIS — F17200 Nicotine dependence, unspecified, uncomplicated: Secondary | ICD-10-CM | POA: Diagnosis present

## 2012-07-26 DIAGNOSIS — Z888 Allergy status to other drugs, medicaments and biological substances status: Secondary | ICD-10-CM

## 2012-07-26 DIAGNOSIS — M4310 Spondylolisthesis, site unspecified: Secondary | ICD-10-CM | POA: Diagnosis present

## 2012-07-26 DIAGNOSIS — G9519 Other vascular myelopathies: Secondary | ICD-10-CM | POA: Diagnosis present

## 2012-07-26 DIAGNOSIS — E119 Type 2 diabetes mellitus without complications: Secondary | ICD-10-CM | POA: Diagnosis present

## 2012-07-26 DIAGNOSIS — I10 Essential (primary) hypertension: Secondary | ICD-10-CM | POA: Diagnosis present

## 2012-07-26 DIAGNOSIS — M48062 Spinal stenosis, lumbar region with neurogenic claudication: Principal | ICD-10-CM | POA: Diagnosis present

## 2012-07-26 DIAGNOSIS — Z833 Family history of diabetes mellitus: Secondary | ICD-10-CM

## 2012-07-26 DIAGNOSIS — Z881 Allergy status to other antibiotic agents status: Secondary | ICD-10-CM

## 2012-07-26 DIAGNOSIS — Z79899 Other long term (current) drug therapy: Secondary | ICD-10-CM

## 2012-07-26 DIAGNOSIS — E785 Hyperlipidemia, unspecified: Secondary | ICD-10-CM | POA: Diagnosis present

## 2012-07-26 LAB — GLUCOSE, CAPILLARY
Glucose-Capillary: 142 mg/dL — ABNORMAL HIGH (ref 70–99)
Glucose-Capillary: 146 mg/dL — ABNORMAL HIGH (ref 70–99)

## 2012-07-26 SURGERY — POSTERIOR LUMBAR FUSION 2 LEVEL
Anesthesia: General | Site: Back | Wound class: Clean

## 2012-07-26 MED ORDER — VANCOMYCIN HCL 500 MG IV SOLR
500.0000 mg | Freq: Two times a day (BID) | INTRAVENOUS | Status: DC
  Administered 2012-07-26 – 2012-07-28 (×4): 500 mg via INTRAVENOUS
  Filled 2012-07-26 (×5): qty 500

## 2012-07-26 MED ORDER — PHENYLEPHRINE HCL 10 MG/ML IJ SOLN
INTRAMUSCULAR | Status: DC | PRN
  Administered 2012-07-26 (×2): 120 ug via INTRAVENOUS
  Administered 2012-07-26 (×5): 80 ug via INTRAVENOUS

## 2012-07-26 MED ORDER — HYDROMORPHONE HCL PF 1 MG/ML IJ SOLN
INTRAMUSCULAR | Status: AC
  Filled 2012-07-26: qty 1

## 2012-07-26 MED ORDER — BUPIVACAINE HCL (PF) 0.25 % IJ SOLN
INTRAMUSCULAR | Status: DC | PRN
  Administered 2012-07-26: 20 mL

## 2012-07-26 MED ORDER — SODIUM CHLORIDE 0.9 % IR SOLN
Status: DC | PRN
  Administered 2012-07-26: 09:00:00

## 2012-07-26 MED ORDER — SODIUM CHLORIDE 0.9 % IV SOLN
250.0000 mL | INTRAVENOUS | Status: DC
  Administered 2012-07-26: 250 mL via INTRAVENOUS

## 2012-07-26 MED ORDER — SENNA 8.6 MG PO TABS
1.0000 | ORAL_TABLET | Freq: Two times a day (BID) | ORAL | Status: DC
  Administered 2012-07-26 – 2012-07-28 (×4): 8.6 mg via ORAL
  Filled 2012-07-26 (×5): qty 1

## 2012-07-26 MED ORDER — FLEET ENEMA 7-19 GM/118ML RE ENEM
1.0000 | ENEMA | Freq: Once | RECTAL | Status: AC | PRN
  Filled 2012-07-26: qty 1

## 2012-07-26 MED ORDER — MENTHOL 3 MG MT LOZG: 1.0000 | LOZENGE | OROMUCOSAL | Status: DC | PRN

## 2012-07-26 MED ORDER — POLYETHYLENE GLYCOL 3350 17 G PO PACK
17.0000 g | PACK | Freq: Every day | ORAL | Status: DC | PRN
  Filled 2012-07-26: qty 1

## 2012-07-26 MED ORDER — PHENOL 1.4 % MT LIQD: 1.0000 | OROMUCOSAL | Status: DC | PRN

## 2012-07-26 MED ORDER — NEOSTIGMINE METHYLSULFATE 1 MG/ML IJ SOLN
INTRAMUSCULAR | Status: DC | PRN
  Administered 2012-07-26: 3 mg via INTRAVENOUS

## 2012-07-26 MED ORDER — GLYCOPYRROLATE 0.2 MG/ML IJ SOLN
INTRAMUSCULAR | Status: DC | PRN
  Administered 2012-07-26: 0.4 mg via INTRAVENOUS

## 2012-07-26 MED ORDER — SPIRONOLACTONE 25 MG PO TABS
25.0000 mg | ORAL_TABLET | Freq: Every day | ORAL | Status: DC
  Administered 2012-07-26 – 2012-07-28 (×3): 25 mg via ORAL
  Filled 2012-07-26 (×3): qty 1

## 2012-07-26 MED ORDER — OXYCODONE-ACETAMINOPHEN 5-325 MG PO TABS
1.0000 | ORAL_TABLET | ORAL | Status: DC | PRN
  Administered 2012-07-26 (×2): 2 via ORAL
  Administered 2012-07-27: 1 via ORAL
  Administered 2012-07-27 – 2012-07-28 (×3): 2 via ORAL
  Filled 2012-07-26: qty 2
  Filled 2012-07-26: qty 1
  Filled 2012-07-26 (×4): qty 2

## 2012-07-26 MED ORDER — SODIUM CHLORIDE 0.9 % IJ SOLN: 3.0000 mL | INTRAMUSCULAR | Status: DC | PRN

## 2012-07-26 MED ORDER — HYDROMORPHONE HCL PF 1 MG/ML IJ SOLN
0.5000 mg | INTRAMUSCULAR | Status: DC | PRN
  Administered 2012-07-26 – 2012-07-28 (×4): 1 mg via INTRAVENOUS
  Filled 2012-07-26 (×4): qty 1

## 2012-07-26 MED ORDER — THROMBIN 20000 UNITS EX SOLR
OROMUCOSAL | Status: DC | PRN
  Administered 2012-07-26 (×2): via TOPICAL

## 2012-07-26 MED ORDER — ACETAMINOPHEN 650 MG RE SUPP: 650.0000 mg | RECTAL | Status: DC | PRN

## 2012-07-26 MED ORDER — ONDANSETRON HCL 4 MG/2ML IJ SOLN: 4.0000 mg | INTRAMUSCULAR | Status: DC | PRN

## 2012-07-26 MED ORDER — FENTANYL CITRATE 0.05 MG/ML IJ SOLN
INTRAMUSCULAR | Status: DC | PRN
  Administered 2012-07-26: 150 ug via INTRAVENOUS

## 2012-07-26 MED ORDER — SODIUM CHLORIDE 0.9 % IV SOLN
INTRAVENOUS | Status: DC | PRN
  Administered 2012-07-26: 11:00:00 via INTRAVENOUS

## 2012-07-26 MED ORDER — LORATADINE 10 MG PO TABS
10.0000 mg | ORAL_TABLET | Freq: Every day | ORAL | Status: DC
  Administered 2012-07-26 – 2012-07-28 (×3): 10 mg via ORAL
  Filled 2012-07-26 (×3): qty 1

## 2012-07-26 MED ORDER — FENOFIBRATE 160 MG PO TABS
160.0000 mg | ORAL_TABLET | Freq: Every day | ORAL | Status: DC
  Administered 2012-07-26 – 2012-07-27 (×2): 160 mg via ORAL
  Filled 2012-07-26 (×3): qty 1

## 2012-07-26 MED ORDER — IRBESARTAN 300 MG PO TABS
300.0000 mg | ORAL_TABLET | Freq: Every day | ORAL | Status: DC
  Administered 2012-07-26 – 2012-07-28 (×2): 300 mg via ORAL
  Filled 2012-07-26 (×3): qty 1

## 2012-07-26 MED ORDER — ROCURONIUM BROMIDE 100 MG/10ML IV SOLN
INTRAVENOUS | Status: DC | PRN
  Administered 2012-07-26: 50 mg via INTRAVENOUS
  Administered 2012-07-26: 30 mg via INTRAVENOUS
  Administered 2012-07-26: 10 mg via INTRAVENOUS

## 2012-07-26 MED ORDER — BISACODYL 10 MG RE SUPP: 10.0000 mg | Freq: Every day | RECTAL | Status: DC | PRN

## 2012-07-26 MED ORDER — ONDANSETRON HCL 4 MG/2ML IJ SOLN: 4.0000 mg | Freq: Once | INTRAMUSCULAR | Status: DC | PRN

## 2012-07-26 MED ORDER — PROPOFOL 10 MG/ML IV BOLUS
INTRAVENOUS | Status: DC | PRN
  Administered 2012-07-26: 170 mg via INTRAVENOUS

## 2012-07-26 MED ORDER — DIAZEPAM 5 MG PO TABS
5.0000 mg | ORAL_TABLET | Freq: Four times a day (QID) | ORAL | Status: DC | PRN
  Administered 2012-07-26 – 2012-07-28 (×5): 5 mg via ORAL
  Filled 2012-07-26 (×5): qty 1

## 2012-07-26 MED ORDER — BACITRACIN 50000 UNITS IM SOLR
INTRAMUSCULAR | Status: AC
  Filled 2012-07-26: qty 1

## 2012-07-26 MED ORDER — 0.9 % SODIUM CHLORIDE (POUR BTL) OPTIME
TOPICAL | Status: DC | PRN
  Administered 2012-07-26: 1000 mL

## 2012-07-26 MED ORDER — ACETAMINOPHEN 325 MG PO TABS: 650.0000 mg | ORAL_TABLET | ORAL | Status: DC | PRN

## 2012-07-26 MED ORDER — HYDROCODONE-ACETAMINOPHEN 5-325 MG PO TABS: 1.0000 | ORAL_TABLET | ORAL | Status: DC | PRN

## 2012-07-26 MED ORDER — ACETAMINOPHEN 10 MG/ML IV SOLN: 1000.0000 mg | Freq: Once | INTRAVENOUS | Status: DC | PRN

## 2012-07-26 MED ORDER — HYDROMORPHONE HCL PF 1 MG/ML IJ SOLN
0.2500 mg | INTRAMUSCULAR | Status: DC | PRN
  Administered 2012-07-26 (×4): 0.5 mg via INTRAVENOUS

## 2012-07-26 MED ORDER — DEXAMETHASONE SODIUM PHOSPHATE 10 MG/ML IJ SOLN: 10.0000 mg | INTRAMUSCULAR | Status: DC

## 2012-07-26 MED ORDER — SIMVASTATIN 20 MG PO TABS
20.0000 mg | ORAL_TABLET | Freq: Every day | ORAL | Status: DC
  Administered 2012-07-26 – 2012-07-27 (×2): 20 mg via ORAL
  Filled 2012-07-26 (×3): qty 1

## 2012-07-26 MED ORDER — ALUM & MAG HYDROXIDE-SIMETH 200-200-20 MG/5ML PO SUSP: 30.0000 mL | Freq: Four times a day (QID) | ORAL | Status: DC | PRN

## 2012-07-26 MED ORDER — SODIUM CHLORIDE 0.9 % IJ SOLN
3.0000 mL | Freq: Two times a day (BID) | INTRAMUSCULAR | Status: DC
  Administered 2012-07-26 – 2012-07-27 (×4): 3 mL via INTRAVENOUS

## 2012-07-26 MED ORDER — ONDANSETRON HCL 4 MG/2ML IJ SOLN
INTRAMUSCULAR | Status: DC | PRN
  Administered 2012-07-26: 4 mg via INTRAVENOUS

## 2012-07-26 MED ORDER — LACTATED RINGERS IV SOLN
INTRAVENOUS | Status: DC | PRN
  Administered 2012-07-26 (×2): via INTRAVENOUS

## 2012-07-26 MED ORDER — HEPARIN SODIUM (PORCINE) 1000 UNIT/ML IJ SOLN
INTRAMUSCULAR | Status: AC
  Filled 2012-07-26: qty 1

## 2012-07-26 MED ORDER — ZOLPIDEM TARTRATE 5 MG PO TABS: 5.0000 mg | ORAL_TABLET | Freq: Every evening | ORAL | Status: DC | PRN

## 2012-07-26 MED ORDER — DEXAMETHASONE SODIUM PHOSPHATE 10 MG/ML IJ SOLN
INTRAMUSCULAR | Status: AC
  Administered 2012-07-26: 10 mg via INTRAVENOUS
  Filled 2012-07-26: qty 1

## 2012-07-26 MED ORDER — LABETALOL HCL 300 MG PO TABS
300.0000 mg | ORAL_TABLET | Freq: Two times a day (BID) | ORAL | Status: DC
  Administered 2012-07-26 – 2012-07-28 (×3): 300 mg via ORAL
  Filled 2012-07-26 (×5): qty 1

## 2012-07-26 MED ORDER — SODIUM CHLORIDE 0.9 % IV SOLN
INTRAVENOUS | Status: AC
  Filled 2012-07-26: qty 500

## 2012-07-26 MED ORDER — SODIUM CHLORIDE 0.9 % IV SOLN
20.0000 mg | INTRAVENOUS | Status: DC | PRN
  Administered 2012-07-26: 5 ug/min via INTRAVENOUS

## 2012-07-26 MED ORDER — AMLODIPINE BESYLATE 5 MG PO TABS
5.0000 mg | ORAL_TABLET | Freq: Two times a day (BID) | ORAL | Status: DC
Start: 2012-07-26 — End: 2012-07-28
  Administered 2012-07-26 – 2012-07-28 (×3): 5 mg via ORAL
  Filled 2012-07-26 (×5): qty 1

## 2012-07-26 SURGICAL SUPPLY — 70 items
ADH SKN CLS APL DERMABOND .7 (GAUZE/BANDAGES/DRESSINGS) ×1
ADH SKN CLS LQ APL DERMABOND (GAUZE/BANDAGES/DRESSINGS) ×1
APL SKNCLS STERI-STRIP NONHPOA (GAUZE/BANDAGES/DRESSINGS) ×1
BAG DECANTER FOR FLEXI CONT (MISCELLANEOUS) ×2 IMPLANT
BENZOIN TINCTURE PRP APPL 2/3 (GAUZE/BANDAGES/DRESSINGS) ×2 IMPLANT
BLADE SURG ROTATE 9660 (MISCELLANEOUS) IMPLANT
BRUSH SCRUB EZ PLAIN DRY (MISCELLANEOUS) ×2 IMPLANT
BUR MATCHSTICK NEURO 3.0 LAGG (BURR) ×2 IMPLANT
CAGE CAPSTONE TELAMON 8X26 (Cage) ×2 IMPLANT
CANISTER SUCTION 2500CC (MISCELLANEOUS) ×2 IMPLANT
CAP LCK SPNE (Orthopedic Implant) ×6 IMPLANT
CAP LOCK SPINE RADIUS (Orthopedic Implant) IMPLANT
CAP LOCKING (Orthopedic Implant) ×12 IMPLANT
CLOTH BEACON ORANGE TIMEOUT ST (SAFETY) ×2 IMPLANT
CONT SPEC 4OZ CLIKSEAL STRL BL (MISCELLANEOUS) ×4 IMPLANT
COVER BACK TABLE 24X17X13 BIG (DRAPES) IMPLANT
COVER TABLE BACK 60X90 (DRAPES) ×2 IMPLANT
CROSSLINK MEDIUM (Orthopedic Implant) ×1 IMPLANT
DECANTER SPIKE VIAL GLASS SM (MISCELLANEOUS) ×1 IMPLANT
DERMABOND ADHESIVE PROPEN (GAUZE/BANDAGES/DRESSINGS) ×1
DERMABOND ADVANCED (GAUZE/BANDAGES/DRESSINGS) ×1
DERMABOND ADVANCED .7 DNX12 (GAUZE/BANDAGES/DRESSINGS) ×1 IMPLANT
DERMABOND ADVANCED .7 DNX6 (GAUZE/BANDAGES/DRESSINGS) IMPLANT
DRAPE C-ARM 42X72 X-RAY (DRAPES) ×4 IMPLANT
DRAPE LAPAROTOMY 100X72X124 (DRAPES) ×2 IMPLANT
DRAPE POUCH INSTRU U-SHP 10X18 (DRAPES) ×2 IMPLANT
DRAPE PROXIMA HALF (DRAPES) IMPLANT
DRAPE SURG 17X23 STRL (DRAPES) ×8 IMPLANT
ELECT REM PT RETURN 9FT ADLT (ELECTROSURGICAL) ×2
ELECTRODE REM PT RTRN 9FT ADLT (ELECTROSURGICAL) ×1 IMPLANT
EVACUATOR 1/8 PVC DRAIN (DRAIN) ×2 IMPLANT
GAUZE SPONGE 4X4 16PLY XRAY LF (GAUZE/BANDAGES/DRESSINGS) IMPLANT
GLOVE BIOGEL PI IND STRL 7.0 (GLOVE) IMPLANT
GLOVE BIOGEL PI IND STRL 8 (GLOVE) IMPLANT
GLOVE BIOGEL PI INDICATOR 7.0 (GLOVE) ×2
GLOVE BIOGEL PI INDICATOR 8 (GLOVE) ×1
GLOVE ECLIPSE 7.5 STRL STRAW (GLOVE) ×1 IMPLANT
GLOVE ECLIPSE 8.5 STRL (GLOVE) ×5 IMPLANT
GLOVE EXAM NITRILE LRG STRL (GLOVE) IMPLANT
GLOVE EXAM NITRILE MD LF STRL (GLOVE) ×1 IMPLANT
GLOVE EXAM NITRILE XL STR (GLOVE) IMPLANT
GLOVE EXAM NITRILE XS STR PU (GLOVE) IMPLANT
GLOVE SS BIOGEL STRL SZ 6.5 (GLOVE) IMPLANT
GLOVE SUPERSENSE BIOGEL SZ 6.5 (GLOVE) ×4
GOWN BRE IMP SLV AUR LG STRL (GOWN DISPOSABLE) ×1 IMPLANT
GOWN BRE IMP SLV AUR XL STRL (GOWN DISPOSABLE) ×5 IMPLANT
GOWN STRL REIN 2XL LVL4 (GOWN DISPOSABLE) IMPLANT
KIT BASIN OR (CUSTOM PROCEDURE TRAY) ×2 IMPLANT
KIT ROOM TURNOVER OR (KITS) ×2 IMPLANT
MILL MEDIUM DISP (BLADE) ×1 IMPLANT
NEEDLE HYPO 22GX1.5 SAFETY (NEEDLE) ×2 IMPLANT
NS IRRIG 1000ML POUR BTL (IV SOLUTION) ×2 IMPLANT
PACK LAMINECTOMY NEURO (CUSTOM PROCEDURE TRAY) ×2 IMPLANT
ROD 70MM (Rod) ×4 IMPLANT
ROD SPNL 70X5.5XNS TI RDS (Rod) IMPLANT
SCREW 5.75X40M (Screw) ×6 IMPLANT
SPONGE GAUZE 4X4 12PLY (GAUZE/BANDAGES/DRESSINGS) ×2 IMPLANT
SPONGE SURGIFOAM ABS GEL 100 (HEMOSTASIS) ×3 IMPLANT
STRIP CLOSURE SKIN 1/2X4 (GAUZE/BANDAGES/DRESSINGS) ×3 IMPLANT
SUT VIC AB 0 CT1 18XCR BRD8 (SUTURE) ×2 IMPLANT
SUT VIC AB 0 CT1 8-18 (SUTURE) ×4
SUT VIC AB 2-0 CT1 18 (SUTURE) ×3 IMPLANT
SUT VIC AB 3-0 SH 8-18 (SUTURE) ×4 IMPLANT
SYR 20ML ECCENTRIC (SYRINGE) ×2 IMPLANT
TAPE CLOTH SURG 4X10 WHT LF (GAUZE/BANDAGES/DRESSINGS) ×1 IMPLANT
TOWEL OR 17X24 6PK STRL BLUE (TOWEL DISPOSABLE) ×2 IMPLANT
TOWEL OR 17X26 10 PK STRL BLUE (TOWEL DISPOSABLE) ×2 IMPLANT
TRAY FOLEY CATH 14FRSI W/METER (CATHETERS) ×2 IMPLANT
WATER STERILE IRR 1000ML POUR (IV SOLUTION) ×2 IMPLANT
WEDGE TANGENT 8X26MM ×2 IMPLANT

## 2012-07-26 NOTE — Progress Notes (Signed)
ANTIBIOTIC CONSULT NOTE - INITIAL  Pharmacy Consult for Vanco  Indication: Post-op spinal surgery with drain  Allergies  Allergen Reactions  . Amoxicillin     REACTION: rash  . Captopril     REACTION: cough  . Simvastatin     REACTION: buttocks cramps  . Adhesive (Tape) Rash    Patient Measurements:   Adjusted Body Weight: 70.9 kg  Vital Signs: Temp: 98 F (36.7 C) (09/16 1213) Temp src: Oral (09/16 0611) BP: 97/44 mmHg (09/16 1213) Pulse Rate: 75  (09/16 1213) Intake/Output from previous day:   Intake/Output from this shift: Total I/O In: 2100 [I.V.:1950; Blood:100; Other:50] Out: 1200 [Urine:850; Blood:350]  Labs: No results found for this basename: WBC:3,HGB:3,PLT:3,LABCREA:3,CREATININE:3 in the last 72 hours The CrCl is unknown because both a height and weight (above a minimum accepted value) are required for this calculation. No results found for this basename: VANCOTROUGH:2,VANCOPEAK:2,VANCORANDOM:2,GENTTROUGH:2,GENTPEAK:2,GENTRANDOM:2,TOBRATROUGH:2,TOBRAPEAK:2,TOBRARND:2,AMIKACINPEAK:2,AMIKACINTROU:2,AMIKACIN:2, in the last 72 hours   Microbiology: Recent Results (from the past 720 hour(s))  SURGICAL PCR SCREEN     Status: Normal   Collection Time   07/22/12 11:01 AM      Component Value Range Status Comment   MRSA, PCR NEGATIVE  NEGATIVE Final    Staphylococcus aureus NEGATIVE  NEGATIVE Final     Medical History: Past Medical History  Diagnosis Date  . Hyperlipidemia   . Diabetes mellitus     2  . Hypertension     unspecified essential  . Family history of anesthesia complication     PATIENTS MOM HAD TROUBLE WAKING UP   . Heart murmur   . Subclavian steal syndrome   . Subclavian steal syndrome     LEFT SIDE   NO SIDE EFFECTS  . AAA (abdominal aortic aneurysm)     BEING WATCHED   [facility]   . Arthritis     Medications:  Prescriptions prior to admission  Medication Sig Dispense Refill  . amLODipine (NORVASC) 5 MG tablet Take 5 mg by mouth 2 (two)  times daily.      . B Complex-C (SUPER B COMPLEX PO) Take 1 tablet by mouth daily.       . Cholecalciferol (VITAMIN D3) 2000 UNITS TABS Take 2,000 Units by mouth daily.       . diazepam (VALIUM) 5 MG tablet Take 5 mg by mouth at bedtime.      . fenofibrate micronized (LOFIBRA) 134 MG capsule Take 134 mg by mouth daily before breakfast.      . fexofenadine (ALLEGRA) 180 MG tablet Take 180 mg by mouth at bedtime.       Marland Kitchen glucose blood (ONE TOUCH ULTRA TEST) test strip Three times daily.  100 each  3  . HYDROcodone-acetaminophen (NORCO/VICODIN) 5-325 MG per tablet Take 1 tablet by mouth every 6 (six) hours as needed. For pain      . labetalol (NORMODYNE) 300 MG tablet Take 300 mg by mouth 2 (two) times daily.      . Menthol, Topical Analgesic, (BIOFREEZE EX) Apply 1 application topically daily as needed. For back pain      . oxyCODONE-acetaminophen (PERCOCET/ROXICET) 5-325 MG per tablet Take 1-2 tablets by mouth every 4 (four) hours.      . pravastatin (PRAVACHOL) 40 MG tablet Take 1 tablet (40 mg total) by mouth every evening.  30 tablet  11  . spironolactone (ALDACTONE) 25 MG tablet Take 1 tablet (25 mg total) by mouth daily.      . valsartan (DIOVAN) 320 MG tablet Take  160 mg by mouth at bedtime.      . vitamin C (ASCORBIC ACID) 500 MG tablet Take 500 mg by mouth daily.      . vitamin E 400 UNIT capsule Take 400 Units by mouth daily.       Assessment: back pain and severe neurogenic claudication. Failed conservative management including epidural steroids  9/16: L3-4, L4-5 decompressive laminectomy with bilateral L3 and L4 decompressive foraminotomies. There is a medium Hemovac drain left in epidural space. Plan to start Vancomycin empirically post-op. Scr elevated at 1.5 with CrCl around 40.  PMH: HLD, DM, HTN, heart murmur, subclavian steal syndrome, AAA, arthritis  Goal of Therapy:  Vancomycin trough level 15-20 mcg/ml  Plan:  Vanco 500mg  IV q12hrs.  Alford Highland, [employer] 07/26/2012,12:45 PM

## 2012-07-26 NOTE — Progress Notes (Signed)
Orthopedic Tech Progress Note Patient Details:  Sheila Melton April 21, 1941 [id number]  Patient ID: Sheila Melton, female   DOB: 07-30-1941, 71 y.o.   MRN: [id number]   [name] 07/26/2012, 4:11 PM ASPEN LUMBAR FUSION BRACE COMPLETED BY BIO TECH

## 2012-07-26 NOTE — Transfer of Care (Signed)
Immediate Anesthesia Transfer of Care Note  Patient: Sheila Melton  Procedure(s) Performed: Procedure(s) (LRB) with comments: POSTERIOR LUMBAR FUSION 2 LEVEL (N/A) - Lumbar three-four, lumbar four-five posterior lumbar interbody fusion, tangent telemon cage, posterolateral arthrodesis with pedicle screws.   Patient Location: PACU  Anesthesia Type: General  Level of Consciousness: awake, alert  and oriented  Airway & Oxygen Therapy: Patient Spontanous Breathing and Patient connected to nasal cannula oxygen  Post-op Assessment: Report given to PACU RN, Post -op Vital signs reviewed and stable and Patient moving all extremities X 4  Post vital signs: Reviewed and stable  Complications: No apparent anesthesia complications

## 2012-07-26 NOTE — Progress Notes (Signed)
Orthopedic Tech Progress Note Patient Details:  LATIQUA GRANDPRE 07/31/1941 [id number]  Patient ID: [name], female   DOB: Jan 24, 1941, 71 y.o.   MRN: [id number]   Braulio Bosch 07/26/2012, 2:22 PM CALLED BIO TECH

## 2012-07-26 NOTE — Anesthesia Preprocedure Evaluation (Addendum)
Anesthesia Evaluation  Patient identified by MRN, date of birth, ID band Patient awake    Reviewed: Allergy & Precautions, H&P , NPO status , Patient's Chart, lab work & pertinent test results  Airway Mallampati: II TM Distance: >3 FB Neck ROM: Full    Dental  (+) Dental Advisory Given and Teeth Intact   Pulmonary COPDCurrent Smoker,  breath sounds clear to auscultation        Cardiovascular hypertension, Rhythm:Regular Rate:Normal     Neuro/Psych PSYCHIATRIC DISORDERS  Neuromuscular disease    GI/Hepatic   Endo/Other  diabetes, Well Controlled  Renal/GU Renal InsufficiencyRenal disease     Musculoskeletal  (+) Arthritis -,   Abdominal   Peds  Hematology   Anesthesia Other Findings   Reproductive/Obstetrics                         Anesthesia Physical Anesthesia Plan  ASA: III  Anesthesia Plan: General   Post-op Pain Management:    Induction: Intravenous  Airway Management Planned: Oral ETT  Additional Equipment:   Intra-op Plan:   Post-operative Plan: Extubation in OR  Informed Consent: I have reviewed the patients History and Physical, chart, labs and discussed the procedure including the risks, benefits and alternatives for the proposed anesthesia with the patient or authorized representative who has indicated his/her understanding and acceptance.   Dental advisory given  Plan Discussed with: CRNA and Surgeon  Anesthesia Plan Comments: (Lumbar DDD with primarily R. Leg pain htn Type 2 DM glucose 141 Renal insufficiency Cr 1.5 H/O L subclavian steal, no symptoms plan BP cuff on R arm  Plan GA  Roberts Gaudy, MD)        Anesthesia Quick Evaluation

## 2012-07-26 NOTE — Op Note (Signed)
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  L3-4 L4-5 posterior lumbar interbody fusion utilizing tangent interbody allograft wedge and Telamon interbody peek cage and local autograft.  L3-4-5 posterior lateral arthrodesis utilizing segmental pedicle screw instrumentation and local autograft.  Surgeon:Baili Stang A.Lyan Moyano, M.D.  Asst. Surgeon: Rita Ohara  Anesthesia: General  Indication: 71 year old female with severe back and bilateral lower trimming symptoms consistent with neurogenic claudication workup demonstrates evidence of marked stenosis at L3-4 and critical stenosis at L4-5 with associated degenerative spondylolisthesis. Patient presents now for 2 level decompression and fusion in hopes of improving her symptoms.   Operative note: After induction of anesthesia, patient positioned prone onto Wilson frame and appropriately padded. Patient's lumbar region prepped and draped sterilely. 10 blade used to make a linear skin incision extending from L3 down to L5. Supper off Linus Mako performed so the lamina facet joints of L2 L3-L4 and L5 as well as the transverse processes of L3 L4-L5. Deep self-retaining first placed intraoperative fluoroscopy is used levels were confirmed. Decompressive laminectomies then performed using Leksell rongeurs Kerrison or his high-speed drill to remove the entire lamina of L3 entire lamina of L4 and the superior aspect of lamina of L5. Ligament flavum was elevated and resected so fashion using Kerrison rongeurs. Anterior facetectomies of L3 and L4 were performed bilaterally. Superior facetectomies of L4 and L5  performed bilaterally. Wide decompressive foraminotomies then performed on course exiting L3-L4 and L5 nerve roots. Epidermides pus was quite limited and cut. Bilateral discectomies and performed at L34 and L4-5. The space and distracted to 8 mm with 8 mm distractor less than patient's right side. Thecal sac or respect of the left-sided L3-4. The spaces and reamed and then cut with 8 mm tangent his fingers soft tissues and removed and interspace. 8 x 26 mm Telamon cage and packed in place recessed roughly 1 mm from the posterior cortical margin. Distractors in patient's right side. The second or respect on the right side. The space was again cut and reamed and then cut with a tangent his fingers soft tissues removed and interspace. Morselized autograft and packed interspace for later fusion. A 8 x 26 mm tangent wedges and packed into place recessed roughly 1 mm posterior current margin of L3. Procedure then repeated at L4-5 again without complication. Pedicles of L3-L4 and L5 and identified using surface landmarks and intraoperative fluoroscopy procedure bone is then removed and the pedicle using high-speed drill the pedicles and probed using pedicle awl each pedicle awl track was then tapped with a 5.5 tumor screw temperature double then probed and found to be solidly within bone. 5.75 x 40 mm radius screws placed bilaterally at L3-L4 and L5. Transverse processes were decorticated. Morselize autograft packed posterior laterally for later fusion. Short segment titanium rod was then placed over the screw heads at L3-4 and 5. Locking caps and placed over the screws were locking catching and engaged with the construct under compression. Final images real good position the bone graft and hardware at proper upper level with normal lamina spine. Wounds that area one final time. Transverse connector was placed. Wounds and closed in layers with Vicryl sutures. Steri-Strips triggers were applied. There is a medium Hemovac  drain left in epidural space.  There were no apparent locations. Patient also well and she returns they're covering postop.

## 2012-07-26 NOTE — Progress Notes (Signed)
UR COMPLETED  

## 2012-07-26 NOTE — Brief Op Note (Signed)
07/26/2012  10:50 AM  PATIENT:  [name]  71 y.o. female  PRE-OPERATIVE DIAGNOSIS:  stenosis, spondylosis  POST-OPERATIVE DIAGNOSIS:  stenosis, spondylosis  PROCEDURE:  Procedure(s) (LRB) with comments: POSTERIOR LUMBAR FUSION 2 LEVEL (N/A) - Lumbar three-four, lumbar four-five posterior lumbar interbody fusion, tangent telemon cage, posterolateral arthrodesis with pedicle screws.   SURGEON:  Surgeon(s) and Role:    * Charlie Pitter, MD - Primary    * Hosie Spangle, MD - Assisting  PHYSICIAN ASSISTANT:   ASSISTANTS:    ANESTHESIA:   general  EBL:  Total I/O In: 1100 [I.V.:1000; Blood:100] Out: 1200 [Urine:850; Blood:350]  BLOOD ADMINISTERED:none  DRAINS: (Medium) Hemovact drain(s) in the Epidural space with  Suction Open   LOCAL MEDICATIONS USED:  MARCAINE     SPECIMEN:  No Specimen  DISPOSITION OF SPECIMEN:  N/A  COUNTS:  YES  TOURNIQUET:  * No tourniquets in log *  DICTATION: .Dragon Dictation  PLAN OF CARE: Admit to inpatient   PATIENT DISPOSITION:  PACU - hemodynamically stable.   Delay start of Pharmacological VTE agent (>24hrs) due to surgical blood loss or risk of bleeding: yes

## 2012-07-26 NOTE — H&P (Signed)
Sheila Melton is an 71 y.o. female.   Chief Complaint: Back pain and leg pain HPI: 71 year old female with back pain and severe neurogenic claudication. Workup demonstrates evidence of marked lumbar stenosis at L3-4 and L4-5 with a degenerative cyst grade 1 spondylolisthesis at L4-5. Patient's failed conservative management including epidural steroids presents now for L3-4 and L4-5 decompression and fusion.  Past Medical History  Diagnosis Date  . Hyperlipidemia   . Diabetes mellitus     2  . Hypertension     unspecified essential  . Family history of anesthesia complication     PATIENTS MOM HAD TROUBLE WAKING UP   . Heart murmur   . Subclavian steal syndrome   . Subclavian steal syndrome     LEFT SIDE   NO SIDE EFFECTS  . AAA (abdominal aortic aneurysm)     BEING WATCHED   VVS   . Arthritis     Past Surgical History  Procedure Date  . Cataract extraction     OS  . G1 p1   . Abdominal hysterectomy   . Dilation and curettage of uterus     Family History  Problem Relation Age of Onset  . Coronary artery disease    . Diabetes    . Heart failure Mother   . Heart disease Mother   . Hypertension Mother   . Other Mother     varicose veins  . Hypertension Sister     X 2  . Diabetes Sister   . Hyperlipidemia Sister   . Other Sister     varicose veins  . Heart disease Father   . Diabetes Father   . Hyperlipidemia Father   . Hypertension Father   . Heart attack Father   . Heart disease Brother     MI in late 51s  . Hyperlipidemia Brother   . Heart attack Brother   . Parkinsonism Brother   . Hypertension Daughter    Social History:  reports that she has been smoking Cigarettes.  She has a 50 pack-year smoking history. She uses smokeless tobacco. She reports that she does not drink alcohol or use illicit drugs.  Allergies:  Allergies  Allergen Reactions  . Amoxicillin     REACTION: rash  . Captopril     REACTION: cough  . Simvastatin     REACTION: buttocks cramps   . Adhesive (Tape) Rash    Medications Prior to Admission  Medication Sig Dispense Refill  . amLODipine (NORVASC) 5 MG tablet Take 5 mg by mouth 2 (two) times daily.      . B Complex-C (SUPER B COMPLEX PO) Take 1 tablet by mouth daily.       . Cholecalciferol (VITAMIN D3) 2000 UNITS TABS Take 2,000 Units by mouth daily.       . diazepam (VALIUM) 5 MG tablet Take 5 mg by mouth at bedtime.      . fenofibrate micronized (LOFIBRA) 134 MG capsule Take 134 mg by mouth daily before breakfast.      . fexofenadine (ALLEGRA) 180 MG tablet Take 180 mg by mouth at bedtime.       Marland Kitchen glucose blood (ONE TOUCH ULTRA TEST) test strip Three times daily.  100 each  3  . HYDROcodone-acetaminophen (NORCO/VICODIN) 5-325 MG per tablet Take 1 tablet by mouth every 6 (six) hours as needed. For pain      . labetalol (NORMODYNE) 300 MG tablet Take 300 mg by mouth 2 (two) times daily.      Marland Kitchen  Menthol, Topical Analgesic, (BIOFREEZE EX) Apply 1 application topically daily as needed. For back pain      . oxyCODONE-acetaminophen (PERCOCET/ROXICET) 5-325 MG per tablet Take 1-2 tablets by mouth every 4 (four) hours.      . pravastatin (PRAVACHOL) 40 MG tablet Take 1 tablet (40 mg total) by mouth every evening.  30 tablet  11  . spironolactone (ALDACTONE) 25 MG tablet Take 1 tablet (25 mg total) by mouth daily.      . valsartan (DIOVAN) 320 MG tablet Take 160 mg by mouth at bedtime.      . vitamin C (ASCORBIC ACID) 500 MG tablet Take 500 mg by mouth daily.      . vitamin E 400 UNIT capsule Take 400 Units by mouth daily.        Results for orders placed during the hospital encounter of 07/26/12 (from the past 48 hour(s))  GLUCOSE, CAPILLARY     Status: Abnormal   Collection Time   07/26/12  6:08 AM      Component Value Range Comment   Glucose-Capillary 142 (*) 70 - 99 mg/dL    No results found.  Review of Systems  Constitutional: Negative.   HENT: Negative.   Eyes: Negative.   Respiratory: Negative.   Cardiovascular:  Negative.   Gastrointestinal: Negative.   Genitourinary: Negative.   Musculoskeletal: Negative.   Skin: Negative.   Neurological: Negative.   Endo/Heme/Allergies: Negative.   Psychiatric/Behavioral: Negative.     Blood pressure 103/57, pulse 76, temperature 98.3 F (36.8 C), temperature source Oral, resp. rate 18, SpO2 99.00%. Physical Exam  Constitutional: She is oriented to person, place, and time. She appears well-developed and well-nourished.  HENT:  Head: Normocephalic and atraumatic.  Right Ear: External ear normal.  Left Ear: External ear normal.  Nose: Nose normal.  Mouth/Throat: Oropharynx is clear and moist.  Eyes: Conjunctivae normal and EOM are normal. Pupils are equal, round, and reactive to light. Right eye exhibits no discharge. Left eye exhibits no discharge.  Neck: Normal range of motion. Neck supple. No tracheal deviation present. No thyromegaly present.  Cardiovascular: Normal rate, regular rhythm, normal heart sounds and intact distal pulses.   No murmur heard. Respiratory: Effort normal and breath sounds normal. No respiratory distress. She has no wheezes.  GI: Soft. Bowel sounds are normal. She exhibits no distension. There is no tenderness.  Musculoskeletal: Normal range of motion. She exhibits no edema and no tenderness.  Neurological: She is alert and oriented to person, place, and time. No cranial nerve deficit. Coordination normal.  Skin: Skin is warm and dry. No rash noted. No erythema. No pallor.  Psychiatric: She has a normal mood and affect. Her behavior is normal. Judgment and thought content normal.     Assessment/Plan L3-4 stenosis, L4-5 stenosis with grade 1 degenerative spondylolisthesis. Plan L3-4 L4-5 decompressive laminectomies and foraminotomies with L3-4 and L4-5 posterior lumbar interbody fusion with tangent allograft wedge Telamon interbody cage and local autograft coupled with posterior lateral arthrodesis utilizing segmental pedicle screw  sedation. Risks and benefits been explained. Patient wishes to proceed.  [name] A 07/26/2012, 7:45 AM

## 2012-07-26 NOTE — Preoperative (Signed)
Beta Blockers   Reason not to administer Beta Blockers:Not Applicable 

## 2012-07-26 NOTE — Anesthesia Postprocedure Evaluation (Signed)
  Anesthesia Post-op Note  Patient: BRIELLA WARNE  Procedure(s) Performed: Procedure(s) (LRB) with comments: POSTERIOR LUMBAR FUSION 2 LEVEL (N/A) - Lumbar three-four, lumbar four-five posterior lumbar interbody fusion, tangent telemon cage, posterolateral arthrodesis with pedicle screws.   Patient Location: PACU  Anesthesia Type: General  Level of Consciousness: awake, alert  and oriented  Airway and Oxygen Therapy: Patient Spontanous Breathing and Patient connected to nasal cannula oxygen  Post-op Pain: mild  Post-op Assessment: Post-op Vital signs reviewed and Patient's Cardiovascular Status Stable  Post-op Vital Signs: stable  Complications: No apparent anesthesia complications

## 2012-07-26 NOTE — Plan of Care (Signed)
Problem: Consults Goal: Diagnosis - Spinal Surgery Outcome: Completed/Met Date Met:  07/26/12 Thoraco/Lumbar Spine Fusion

## 2012-07-26 NOTE — Anesthesia Procedure Notes (Signed)
Procedure Name: Intubation Date/Time: 07/26/2012 8:00 AM Performed by: Kyung Rudd Pre-anesthesia Checklist: Patient identified, Emergency Drugs available, Suction available, Patient being monitored and Timeout performed Patient Re-evaluated:Patient Re-evaluated prior to inductionOxygen Delivery Method: Circle system utilized Preoxygenation: Pre-oxygenation with 100% oxygen Intubation Type: IV induction Ventilation: Mask ventilation without difficulty and Oral airway inserted - appropriate to patient size Laryngoscope Size: Mac and 3 Grade View: Grade I Tube type: Oral Tube size: 7.0 mm Number of attempts: 1 Airway Equipment and Method: Stylet Placement Confirmation: ETT inserted through vocal cords under direct vision,  positive ETCO2 and breath sounds checked- equal and bilateral Secured at: 20 cm Tube secured with: Tape Dental Injury: Teeth and Oropharynx as per pre-operative assessment

## 2012-07-27 LAB — BASIC METABOLIC PANEL
BUN: 27 mg/dL — ABNORMAL HIGH (ref 6–23)
CO2: 27 mEq/L (ref 19–32)
Chloride: 103 mEq/L (ref 96–112)
Creatinine, Ser: 1.3 mg/dL — ABNORMAL HIGH (ref 0.50–1.10)
Glucose, Bld: 167 mg/dL — ABNORMAL HIGH (ref 70–99)

## 2012-07-27 LAB — CBC
HCT: 25.6 % — ABNORMAL LOW (ref 36.0–46.0)
Hemoglobin: 8.4 g/dL — ABNORMAL LOW (ref 12.0–15.0)
MCV: 89.2 fL (ref 78.0–100.0)
RBC: 2.87 MIL/uL — ABNORMAL LOW (ref 3.87–5.11)
RDW: 14.3 % (ref 11.5–15.5)
WBC: 16.4 10*3/uL — ABNORMAL HIGH (ref 4.0–10.5)

## 2012-07-27 LAB — GLUCOSE, CAPILLARY
Glucose-Capillary: 133 mg/dL — ABNORMAL HIGH (ref 70–99)
Glucose-Capillary: 183 mg/dL — ABNORMAL HIGH (ref 70–99)
Glucose-Capillary: 145 mg/dL — ABNORMAL HIGH (ref 70–99)

## 2012-07-27 NOTE — Progress Notes (Signed)
I agree with the following treatment note after reviewing documentation.   Johnston, Oluwadarasimi Favor Brynn   OTR/L Pager: [phone] Office: [phone] .   

## 2012-07-27 NOTE — Evaluation (Signed)
Physical Therapy Evaluation Patient Details Name: Sheila Melton MRN: [id number] DOB: 1941-04-26 Today's Date: 07/27/2012 Time: 0802-0828 PT Time Calculation (min): 26 min  PT Assessment / Plan / Recommendation Clinical Impression  Pt admitted for PLIF L3-5 and demonstrates decreased mobility and gait. Pt will benefit from acute therapy to maximize mobility, gait, transfers and function to decrease burden of care prior to discharge. Pt educated for precautions with handout and brace wear with assist to don brace.     PT Assessment  Patient needs continued PT services    Follow Up Recommendations  Home health PT    Barriers to Discharge None      Equipment Recommendations  Rolling walker with 5" wheels;3 in 1 bedside comode    Recommendations for Other Services     Frequency Min 5X/week    Precautions / Restrictions Precautions Precautions: Back Precaution Booklet Issued: Yes (comment) Required Braces or Orthoses: Spinal Brace Spinal Brace: Applied in sitting position Restrictions Weight Bearing Restrictions: No   Pertinent Vitals/Pain 4/10 back pain, premedicated, repositioned      Mobility  Bed Mobility Bed Mobility: Rolling Left;Left Sidelying to Sit Rolling Left: 5: Supervision Left Sidelying to Sit: 4: Min assist;HOB flat Details for Bed Mobility Assistance: cueing for sequence, precautions and technique Transfers Transfers: Sit to Stand;Stand to Sit Sit to Stand: 5: Supervision;From bed Stand to Sit: 5: Supervision;With armrests;To chair/3-in-1 Details for Transfer Assistance: cueing for hand placement, safety and precautions Ambulation/Gait Ambulation/Gait Assistance: 5: Supervision Ambulation Distance (Feet): 200 Feet Assistive device: Rolling walker Ambulation/Gait Assistance Details: cueing for posture and position in RW Gait Pattern: Step-through pattern;Decreased stride length Gait velocity: decreased Stairs: No    Exercises     PT Diagnosis:  Difficulty walking;Acute pain  PT Problem List: Decreased activity tolerance;Decreased mobility;Decreased knowledge of precautions;Decreased knowledge of use of DME;Pain PT Treatment Interventions: Gait training;DME instruction;Stair training;Functional mobility training;Therapeutic activities;Patient/family education   PT Goals Acute Rehab PT Goals PT Goal Formulation: With patient Time For Goal Achievement: 08/03/12 Potential to Achieve Goals: Good Pt will go Supine/Side to Sit: with HOB 0 degrees;with modified independence PT Goal: Supine/Side to Sit - Progress: Goal set today Pt will go Sit to Supine/Side: with modified independence;with HOB 0 degrees PT Goal: Sit to Supine/Side - Progress: Goal set today Pt will go Sit to Stand: with modified independence PT Goal: Sit to Stand - Progress: Goal set today Pt will go Stand to Sit: with modified independence PT Goal: Stand to Sit - Progress: Goal set today Pt will Ambulate: with modified independence;>150 feet;with least restrictive assistive device PT Goal: Ambulate - Progress: Goal set today Pt will Go Up / Down Stairs: 3-5 stairs;with min assist;with least restrictive assistive device PT Goal: Up/Down Stairs - Progress: Goal set today Additional Goals Additional Goal #1: Pt will independently state and adhere to 3/3 back precautions with mobility PT Goal: Additional Goal #1 - Progress: Goal set today  Visit Information  Last PT Received On: 07/27/12 Assistance Needed: +1    Subjective Data  Subjective: My mother-in-law died the day I came in the hospital Patient Stated Goal: be able to return home   Prior Functioning  Home Living Lives With: Spouse Available Help at Discharge: Family Type of Home: House (townhouse) Home Access: Stairs to enter Technical brewer of Steps: 4 Home Layout: Two level;Able to live on main level with bedroom/bathroom Bathroom Shower/Tub: Walk-in shower;Door [employer] Toilet: Standard Home  Adaptive Equipment: Straight cane;Built-in shower seat Prior Function Level of Independence: Independent  Able to Take Stairs?: Yes Driving: Yes Vocation: 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    Cognition  Overall Cognitive Status: Appears within functional limits for tasks assessed/performed Arousal/Alertness: Awake/alert Orientation Level: Appears intact for tasks assessed Behavior During Session: [facility] for tasks performed    Extremity/Trunk Assessment Right Lower Extremity Assessment RLE ROM/Strength/Tone: [facility] for tasks assessed Left Lower Extremity Assessment LLE ROM/Strength/Tone: [facility] for tasks assessed Trunk Assessment Trunk Assessment: Normal   Balance    End of Session PT - End of Session Equipment Utilized During Treatment: Gait belt;Back brace Activity Tolerance: Patient tolerated treatment well Patient left: in chair;with call bell/phone within reach Nurse Communication: Mobility status  GP     Melford Aase 07/27/2012, 8:48 AM  Elwyn Reach, [place]

## 2012-07-27 NOTE — Progress Notes (Signed)
Occupational Therapy Evaluation Patient Details Name: Sheila Melton MRN: [id number] DOB: 02/06/1941 Today's Date: 07/27/2012 Time: [id number] OT Time Calculation (min): 39 min  OT Assessment / Plan / Recommendation Clinical Impression  Pt.is a 71 yo female with decompression and fusion of L3-4 and L4-5. Patient could benefit from OT services for safety with BADL's while adhering to back precautions.     OT Assessment  Patient needs continued OT Services    Follow Up Recommendations  Home health OT    Barriers to Discharge      Equipment Recommendations  Rolling walker with 5" wheels;3 in 1 bedside comode    Recommendations for Other Services    Frequency  Min 2X/week    Precautions / Restrictions Precautions Precautions: Back Precaution Booklet Issued: Yes (comment) Precaution Comments: Pt. unable to recall back precautions after verbal instruction x3. Pt. stated she will review the handout and try to remember them  Required Braces or Orthoses: Spinal Brace Spinal Brace: Applied in sitting position Restrictions Weight Bearing Restrictions: No   Pertinent Vitals/Pain None taken     ADL  Eating/Feeding: Performed;Set up Where Assessed - Eating/Feeding: Chair Grooming: Simulated;Set up Where Assessed - Grooming: Supported standing Upper Body Bathing: Simulated;Right arm;Left arm;Set up Where Assessed - Upper Body Bathing: Supported sitting Lower Body Bathing: Simulated;Minimal assistance Where Assessed - Lower Body Bathing: Supported sitting Upper Body Dressing: Performed;Supervision/safety Where Assessed - Upper Body Dressing: Unsupported sitting Lower Body Dressing: Performed;Modified independent Where Assessed - Lower Body Dressing: Unsupported sitting Toilet Transfer: Performed;Min guard Toilet Transfer Method: Sit to Loss adjuster, chartered: Regular height toilet Toileting - Clothing Manipulation and Hygiene: Simulated;Minimal assistance Where Assessed -  Best boy and Hygiene: Sit to stand from 3-in-1 or toilet Tub/Shower Transfer: Performed;Min guard Tub/Shower Transfer Method: Therapist, art: Shower seat with back;Grab bars;Walk in shower Equipment Used: Back brace;Gait belt;Long-handled sponge;Reacher;Rolling walker;Sock aid Transfers/Ambulation Related to ADLs: Pt. able to transfer with verbal cueing for adhering to back precautions and hand placement  ADL Comments: Pt. able to demonstrate shower transfer with min guard for safety and verbal cues for maintaining back precautions. Pt. educated on use of long handled sponge and reacher for LE bathing as well as a sock aid to help don socks due to patient reporting pain when bringing leg to lap when donning socks.     OT Diagnosis: Acute pain  OT Problem List: Decreased safety awareness;Decreased knowledge of use of DME or AE;Decreased knowledge of precautions;Pain OT Treatment Interventions: Self-care/ADL training;DME and/or AE instruction;Patient/family education   OT Goals Acute Rehab OT Goals OT Goal Formulation: With patient Time For Goal Achievement: 08/09/12 Potential to Achieve Goals: Good ADL Goals Pt Will Perform Lower Body Bathing: with modified independence;Sitting at sink;with adaptive equipment ADL Goal: Lower Body Bathing - Progress: Goal set today Pt Will Perform Upper Body Dressing: with supervision;Sitting, chair ADL Goal: Upper Body Dressing - Progress: Goal set today Pt Will Perform Lower Body Dressing: with modified independence;Sit to stand from chair;Supported;with adaptive equipment ADL Goal: Lower Body Dressing - Progress: Goal set today Pt Will Transfer to Toilet: with modified independence;3-in-1;Maintaining back safety precautions ADL Goal: Toilet Transfer - Progress: Goal set today Pt Will Perform Toileting - Clothing Manipulation: with modified independence;Sitting on 3-in-1 or toilet ADL Goal: Toileting -  Clothing Manipulation - Progress: Goal set today Pt Will Perform Toileting - Hygiene: with modified independence;Leaning right and/or left on 3-in-1/toilet  Visit Information  Last OT Received On: 07/27/12 Assistance Needed: +1  Subjective Data  Subjective: I hope my husband can help when I get home. He has a pacemaker and cant lift heavy things.  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 Dominant Hand: Right      Cognition  Overall Cognitive Status: Appears within functional limits for tasks assessed/performed Arousal/Alertness: Awake/alert Orientation Level: Appears intact for tasks assessed Behavior During Session: [facility] for tasks performed    Extremity/Trunk Assessment Right Upper Extremity Assessment RUE ROM/Strength/Tone: WFL for tasks assessed RUE Coordination: WFL - gross/fine motor Left Upper Extremity Assessment LUE ROM/Strength/Tone: WFL for tasks assessed LUE Coordination: WFL - gross/fine motor Right Lower Extremity Assessment RLE ROM/Strength/Tone: WFL for tasks assessed Left Lower Extremity Assessment LLE ROM/Strength/Tone: WFL for tasks assessed Trunk Assessment Trunk Assessment: Normal   Mobility    Bed Mobility Bed Mobility: Not assessed Rolling Left: 5: Supervision Left Sidelying to Sit: 4: Min assist;HOB flat Details for Bed Mobility Assistance: cueing for sequence, precautions and  technique Transfers Transfers: Sit to Stand;Stand to Sit Sit to Stand: 5: Supervision;From chair/3-in-1;With armrests;With upper extremity assist Stand to Sit: 5: Supervision;With upper extremity assist;With armrests;To chair/3-in-1 Details for Transfer Assistance: cueing for hand placement, safety and precautions                 End of Session OT - End of Session Equipment Utilized During Treatment: Gait belt;Back brace Activity Tolerance: Patient tolerated treatment well Patient left: in chair;with call bell/phone within reach Nurse Communication: Mobility status  GO     Sharyn Creamer 07/27/2012, 9:38 AM

## 2012-07-27 NOTE — Progress Notes (Signed)
Postop day 1. Overall doing well. Pain well controlled. No lower extremity pain.  Afebrile with stable vitals. Urine output good. Drain output minimal. Wound clean and dry. Motor and sensory function intact. Chest and abdomen benign. Postop labs okay.  Progressing well following 2 level lumbar decompression and fusion. Continue efforts at mobilization. Possible discharge home tomorrow.

## 2012-07-28 LAB — GLUCOSE, CAPILLARY

## 2012-07-28 MED ORDER — DIAZEPAM 5 MG PO TABS: 5.0000 mg | ORAL_TABLET | Freq: Every day | ORAL | Status: DC

## 2012-07-28 MED ORDER — OXYCODONE-ACETAMINOPHEN 5-325 MG PO TABS: 1.0000 | ORAL_TABLET | ORAL | Status: DC

## 2012-07-28 NOTE — Care Management Note (Unsigned)
    Page 1 of 2   07/28/2012     4:22:48 PM   CARE MANAGEMENT NOTE 07/28/2012  Patient:  Sheila Melton, Sheila Melton   Account Number:  [id number]  Date Initiated:  07/27/2012  Documentation initiated by:  Rozanna Boer  Subjective/Objective Assessment:   POD#1 LUMBAR DECOMPRESSION/FUSION  PT recommends HHPT and DME     Action/Plan:   home with self care   Anticipated DC Date:  07/28/2012   Anticipated DC Plan:  [place]  CM consult      [facility] Choice  DURABLE MEDICAL EQUIPMENT   Choice offered to / List presented to:  C-1 Patient   DME arranged  3-N-1  Vassie Moselle      DME agency  [place].        Status of service:  In process, will continue to follow Medicare Important Message given?   (If response is "NO", the following Medicare IM given date fields will be blank) Date Medicare IM given:   Date Additional Medicare IM given:    Discharge Disposition:  HOME/SELF CARE  Per UR Regulation:  Reviewed for med. necessity/level of care/duration of stay  If discussed at [place] of Stay Meetings, dates discussed:    Comments:  07/28/12  16:20  Rozanna Boer RN/CM Per Dr. Annette Stable, no HHPT/OT needed, discharged home with self careq   07/27/12  16:10 Rozanna Boer RN/CM DME per [facility]

## 2012-07-28 NOTE — Progress Notes (Signed)
I agree with the following treatment note after reviewing documentation.   [place], [place] Usrey Brynn   OTR/L Pager: [phone] Office: [phone] .   

## 2012-07-28 NOTE — Progress Notes (Signed)
Pt. Tolerated procedure well. Pt. Alert and oriented,follows simple instructions, denies pain. Incision area without swelling, redness or S/S of infection. Voiding adequate clear yellow urine. Moving all extremities well and vitals stable and documented. Lumbar surgery notes instructions and scripts given to patient and family member for home safety and precautions.Pt and family stated understanding of instructions given.

## 2012-07-28 NOTE — Progress Notes (Signed)
Physical Therapy Treatment Patient Details Name: Sheila Melton MRN: [id number] DOB: 06-11-1941 Today's Date: 07/28/2012 Time: [id number] PT Time Calculation (min): 26 min  PT Assessment / Plan / Recommendation Comments on Treatment Session  Pt rather sleepy this am and requiring cues for mobility to follow precautions.  Husband present and very supportive.  Pt to received f/u HHPT upon d/c.    Follow Up Recommendations  Home health PT    Barriers to Discharge        Equipment Recommendations  Rolling walker with 5" wheels;3 in 1 bedside comode    Recommendations for Other Services    Frequency Min 5X/week   Plan Discharge plan remains appropriate;Frequency remains appropriate    Precautions / Restrictions Precautions Precautions: Back Precaution Booklet Issued: Yes (comment) Precaution Comments: Pt able to recall 2/3 precautions.  Husband able to recall all precautions.  Re-educated pt on back precautions. Required Braces or Orthoses: Spinal Brace Spinal Brace: Applied in sitting position Restrictions Weight Bearing Restrictions: No   Pertinent Vitals/Pain No c/o pain other than incisional soreness.    Mobility  Bed Mobility Bed Mobility: Rolling Right;Right Sidelying to Sit;Sit to Sidelying Right Rolling Right: 5: Supervision;With rail Right Sidelying to Sit: 5: Supervision;With rails;HOB flat Sit to Sidelying Right: 4: Min assist;HOB flat Details for Bed Mobility Assistance: Pt requires cues for technique, sequencing and precautions.  Pt needed min assist to bring LE's onto bed to sidelying. Transfers Transfers: Sit to Stand;Stand to Sit Sit to Stand: 6: Modified independent (Device/Increase time);From bed;From toilet;With upper extremity assist Stand to Sit: 6: Modified independent (Device/Increase time);With upper extremity assist;To bed;To toilet Details for Transfer Assistance: cues for hand placement during sit to stand Ambulation/Gait Ambulation/Gait Assistance: 5:  Supervision Ambulation Distance (Feet): 140 Feet Assistive device: Rolling walker Gait Pattern: Step-through pattern;Decreased stride length Gait velocity: decreased Stairs: Yes Stairs Assistance: 4: Min assist Stairs Assistance Details (indicate cue type and reason): cues for technique and min assist Stair Management Technique: Forwards;Other (comment) (HHA on R) Number of Stairs: 2  Wheelchair Mobility Wheelchair Mobility: No        PT Goals Acute Rehab PT Goals PT Goal: Supine/Side to Sit - Progress: Progressing toward goal PT Goal: Sit to Supine/Side - Progress: Progressing toward goal PT Goal: Sit to Stand - Progress: Progressing toward goal PT Goal: Stand to Sit - Progress: Progressing toward goal PT Goal: Ambulate - Progress: Progressing toward goal PT Goal: Up/Down Stairs - Progress: Progressing toward goal Additional Goals PT Goal: Additional Goal #1 - Progress: Progressing toward goal  Visit Information  Last PT Received On: 07/28/12 Assistance Needed: +1    Subjective Data  Subjective: "I'm so sleepy today.  I did not sleep well last night."   Cognition  Overall Cognitive Status: Appears within functional limits for tasks assessed/performed Arousal/Alertness: Awake/alert Orientation Level: Appears intact for tasks assessed Behavior During Session: [facility] for tasks performed    Balance  Balance Balance Assessed: No High Level Balance High Level Balance Activites: Backward walking High Level Balance Comments: Pt. able to walk backward 3-4 steps while in the restroom with supervision for safety   End of Session PT - End of Session Equipment Utilized During Treatment: Gait belt;Back brace Activity Tolerance: Patient tolerated treatment well Patient left: in bed;with call bell/phone within reach;with family/visitor present Nurse Communication: Mobility status   Narda Bonds 07/28/2012, 11:00 AM  Narda Bonds, PTA Acute Rehab [phone]  (office)

## 2012-07-28 NOTE — Discharge Summary (Signed)
Physician Discharge Summary  Patient ID: Sheila Melton MRN: [id number] DOB/AGE: [birth year] 71 y.o.  Admit date: 07/26/2012 Discharge date: 07/28/2012  Admission Diagnoses:  Discharge Diagnoses:  Principal Problem:  *Lumbosacral stenosis with neurogenic claudication Active Problems:  Degenerative spondylolisthesis   Discharged Condition: good  Hospital Course: Patient in the hospital where she underwent uncomplicated 2 level lumbar depression and fusion. Postoperative she is done well peer back and lower trimming pain much improved. Sensation intact. Wound healing well. Plan for discharge home. Condition at discharge is improved. Consults:   Significant Diagnostic Studies:   Treatments:   Discharge Exam: Blood pressure 123/54, pulse 107, temperature 98.4 F (36.9 C), temperature source Oral, resp. rate 18, height 5' 4.17" (1.63 m), weight 70.69 kg (155 lb 13.5 oz), SpO2 93.00%. Awake and alert oriented and appropriate her cranial nerve function is intact. Motor and sensory function extremities intact. Wound clean dry and intact. Chest and abdomen benign.  Disposition:   Discharge Orders    Future Appointments: Provider: Department: Dept Phone: Center:   08/05/2012 11:00 AM Hendricks Limes, MD [facility] [phone] [facility]   09/07/2012 8:15 AM [facility] [id number] [facility]   05/24/2013 9:00 AM [facility] [id number] [facility]   05/24/2013 9:30 AM Rosetta Posner, MD [facility] [id number] [facility]       Medication List     As of 07/28/2012  9:07 AM    STOP taking these medications         HYDROcodone-acetaminophen 5-325 MG per tablet   Commonly known as: NORCO/VICODIN      TAKE these medications         amLODipine 5 MG tablet   Commonly known as: NORVASC   Take 5 mg by mouth 2 (two) times daily.      BIOFREEZE EX   Apply 1 application topically daily as needed. For back pain      diazepam 5 MG tablet   Commonly known as:  VALIUM   Take 1-2 tablets (5-10 mg total) by mouth at bedtime.      fenofibrate micronized 134 MG capsule   Commonly known as: LOFIBRA   Take 134 mg by mouth daily before breakfast.      fexofenadine 180 MG tablet   Commonly known as: ALLEGRA   Take 180 mg by mouth at bedtime.      glucose blood test strip   Three times daily.      labetalol 300 MG tablet   Commonly known as: NORMODYNE   Take 300 mg by mouth 2 (two) times daily.      oxyCODONE-acetaminophen 5-325 MG per tablet   Commonly known as: PERCOCET/ROXICET   Take 1-2 tablets by mouth every 4 (four) hours.      pravastatin 40 MG tablet   Commonly known as: PRAVACHOL   Take 1 tablet (40 mg total) by mouth every evening.      spironolactone 25 MG tablet   Commonly known as: ALDACTONE   Take 1 tablet (25 mg total) by mouth daily.      SUPER B COMPLEX PO   Take 1 tablet by mouth daily.      valsartan 320 MG tablet   Commonly known as: DIOVAN   Take 160 mg by mouth at bedtime.      vitamin C 500 MG tablet   Commonly known as: ASCORBIC ACID   Take 500 mg by mouth daily.      Vitamin D3 2000 UNITS Tabs   Take 2,000  Units by mouth daily.      vitamin E 400 UNIT capsule   Take 400 Units by mouth daily.           Follow-up Information    Follow up with Baylea Milburn A, MD. Call in 1 week. (Ask for Wells Guiles)    Contact information:   [address] [place] [address] [phone]          Signed: Jasmia Angst A 07/28/2012, 9:07 AM

## 2012-07-28 NOTE — Progress Notes (Signed)
Occupational Therapy Treatment Patient Details Name: Sheila Melton MRN: [id number] DOB: 07-10-1941 Today's Date: 07/28/2012 Time: [id number] OT Time Calculation (min): 27 min  OT Assessment / Plan / Recommendation Comments on Treatment Session Pt. tolerated treatment well. Upon entering room asked pt to recall precautions pt only able to remember 1/3. Therapist re-educated pt on precautions and asked for verbal understanding at end of session. Pt. still only able to recall no bending precaution. Pt. able to complete ADL tasks at sink level while adhering to precautions.     Follow Up Recommendations  Home health OT    Barriers to Discharge       Equipment Recommendations  Rolling walker with 5" wheels;3 in 1 bedside comode    Recommendations for Other Services    Frequency Min 2X/week   Plan Discharge plan remains appropriate    Precautions / Restrictions Precautions Precautions: Back Precaution Booklet Issued: Yes (comment) Precaution Comments: Pt. only able to recall 1/3 back precautions (no bending). Pt re-educated on back precautions and asked for verbal recall at end of session stilll only able to recall the no bending precaution  Required Braces or Orthoses: Spinal Brace Spinal Brace: Applied in sitting position Restrictions Weight Bearing Restrictions: No   Pertinent Vitals/Pain None taken     ADL  Eating/Feeding: Performed;Set up Where Assessed - Eating/Feeding: Chair Grooming: Performed;Wash/dry hands;Wash/dry face;Teeth care;Brushing hair;Modified independent Where Assessed - Grooming: Supported standing Upper Body Bathing: Performed;Supervision/safety Where Assessed - Upper Body Bathing: Supported standing Lower Body Bathing: Simulated;Minimal assistance Where Assessed - Lower Body Bathing: Unsupported sitting Upper Body Dressing: Performed;Supervision/safety Where Assessed - Upper Body Dressing: Unsupported sitting Lower Body Dressing: Performed;Minimal  assistance (required assistance for donning panties over her feet) Where Assessed - Lower Body Dressing: Unsupported sitting Toilet Transfer: Performed;Supervision/safety Toilet Transfer Method: Sit to Loss adjuster, chartered: Regular height toilet Toileting - Clothing Manipulation and Hygiene: Performed;Supervision/safety;Minimal assistance (supervision for hygiene, min. assist for donning panties) Where Assessed - Toileting Clothing Manipulation and Hygiene: Standing Equipment Used: Back brace;Gait belt;Rolling walker Transfers/Ambulation Related to ADLs: Pt. ambulated to bathroom to perform ADL's required verbal cues for safe hand placement during transfers ADL Comments: Pt. adhered to back precautions throughout ADL sesssion. Pt. used a cup to spit into while brushing teeth in order to maintain no bending precaution at the sink level     OT Diagnosis:    OT Problem List:   OT Treatment Interventions:     OT Goals Acute Rehab OT Goals OT Goal Formulation: With patient Time For Goal Achievement: 08/09/12 Potential to Achieve Goals: Good ADL Goals Pt Will Perform Lower Body Bathing: with modified independence;Sitting at sink;with adaptive equipment ADL Goal: Lower Body Bathing - Progress: Progressing toward goals Pt Will Perform Upper Body Dressing: with supervision;Sitting, chair ADL Goal: Upper Body Dressing - Progress: Met Pt Will Perform Lower Body Dressing: with modified independence;Sit to stand from chair;Supported;with adaptive equipment ADL Goal: Lower Body Dressing - Progress: Progressing toward goals Pt Will Transfer to Toilet: with modified independence;3-in-1;Maintaining back safety precautions ADL Goal: Toilet Transfer - Progress: Met Pt Will Perform Toileting - Clothing Manipulation: with modified independence;Sitting on 3-in-1 or toilet ADL Goal: Toileting - Clothing Manipulation - Progress: Progressing toward goals Pt Will Perform Toileting - Hygiene: with  modified independence;Leaning right and/or left on 3-in-1/toilet ADL Goal: Toileting - Hygiene - Progress: Met  Visit Information  Last OT Received On: 07/28/12 Assistance Needed: +1    Subjective Data      Prior Functioning  Cognition  Overall Cognitive Status: Appears within functional limits for tasks assessed/performed Arousal/Alertness: Awake/alert Orientation Level: Appears intact for tasks assessed Behavior During Session: [facility] for tasks performed    Mobility   Bed Mobility Bed Mobility: Not assessed Transfers Transfers: Sit to Stand;Stand to Sit Sit to Stand: 6: Modified independent (Device/Increase time);From bed;From toilet;With upper extremity assist Stand to Sit: 6: Modified independent (Device/Increase time);With upper extremity assist;To bed;To toilet Details for Transfer Assistance: cueing for proper hand placement during sit<>stand              Balance Balance Balance Assessed: Yes High Level Balance High Level Balance Activites: Backward walking High Level Balance Comments: Pt. able to walk backward 3-4 steps while in the restroom with supervision for safety    End of Session OT - End of Session Equipment Utilized During Treatment: Gait belt;Back brace Activity Tolerance: Patient tolerated treatment well Patient left: in bed;with call bell/phone within reach;with family/visitor present Nurse Communication: Mobility status;Precautions  GO     Sharyn Creamer 07/28/2012, 10:13 AM

## 2012-07-29 MED FILL — Heparin Sodium (Porcine) Inj 1000 Unit/ML: INTRAMUSCULAR | Qty: 30 | Status: AC

## 2012-07-29 MED FILL — Sodium Chloride IV Soln 0.9%: INTRAVENOUS | Qty: 1000 | Status: AC

## 2012-07-29 MED FILL — Sodium Chloride Irrigation Soln 0.9%: Qty: 3000 | Status: AC

## 2012-08-05 ENCOUNTER — Ambulatory Visit: Payer: Medicare Other | Admitting: Internal Medicine

## 2012-08-09 ENCOUNTER — Other Ambulatory Visit: Payer: Medicare Other

## 2012-09-07 ENCOUNTER — Other Ambulatory Visit (INDEPENDENT_AMBULATORY_CARE_PROVIDER_SITE_OTHER): Payer: Medicare Other

## 2012-09-07 ENCOUNTER — Ambulatory Visit (INDEPENDENT_AMBULATORY_CARE_PROVIDER_SITE_OTHER): Payer: Medicare Other

## 2012-09-07 DIAGNOSIS — E119 Type 2 diabetes mellitus without complications: Secondary | ICD-10-CM

## 2012-09-07 DIAGNOSIS — IMO0001 Reserved for inherently not codable concepts without codable children: Secondary | ICD-10-CM

## 2012-09-07 DIAGNOSIS — Z23 Encounter for immunization: Secondary | ICD-10-CM

## 2012-09-07 LAB — BASIC METABOLIC PANEL
CO2: 25 mEq/L (ref 19–32)
Calcium: 9.6 mg/dL (ref 8.4–10.5)
Potassium: 4 mEq/L (ref 3.5–5.1)
Sodium: 137 mEq/L (ref 135–145)

## 2012-09-07 LAB — MICROALBUMIN / CREATININE URINE RATIO
Creatinine,U: 70 mg/dL
Microalb, Ur: 6.2 mg/dL — ABNORMAL HIGH (ref 0.0–1.9)

## 2012-09-16 ENCOUNTER — Ambulatory Visit (INDEPENDENT_AMBULATORY_CARE_PROVIDER_SITE_OTHER): Payer: Medicare Other | Admitting: Internal Medicine

## 2012-09-16 ENCOUNTER — Encounter: Payer: Self-pay | Admitting: Internal Medicine

## 2012-09-16 VITALS — BP 132/60 | HR 89 | Wt 156.0 lb

## 2012-09-16 DIAGNOSIS — I10 Essential (primary) hypertension: Secondary | ICD-10-CM

## 2012-09-16 DIAGNOSIS — Q619 Cystic kidney disease, unspecified: Secondary | ICD-10-CM

## 2012-09-16 DIAGNOSIS — I714 Abdominal aortic aneurysm, without rupture: Secondary | ICD-10-CM

## 2012-09-16 DIAGNOSIS — N281 Cyst of kidney, acquired: Secondary | ICD-10-CM

## 2012-09-16 NOTE — Patient Instructions (Addendum)
Stop Aldactone;Blood Pressure Goal is < 140/90; ideal is an AVERAGE < 135/85. This AVERAGE should be calculated from @ least 5-7 BP readings taken @ different times of day on different days of week. You should not respond to isolated BP readings , but rather the AVERAGE for that week  Plain Mucinex for thick secretions ;force NON dairy fluids . Use a Neti pot daily as needed for sinus congestion; going from open side to congested side . Nasal cleansing in the shower as discussed. Make sure that all residual soap is removed to prevent irritation. Nasonex 1 spray in each nostril twice a day as needed. Use the "crossover" technique as discussed. Plain Allegra 160 daily as needed for itchy eyes & sneezing. Please  schedule fasting Labs in 10- 12 weeks : BMET,A1c , urine microalbumin. PLEASE BRING THESE INSTRUCTIONS TO FOLLOW UP  LAB APPOINTMENT.This will guarantee correct labs are drawn, eliminating need for repeat blood sampling ( needle sticks ! ). Diagnoses /Codes: 401.9,790.29. Eat a low-fat diet with lots of fruits and vegetables, up to 7-9 servings per day. Consume less than 30 (preferabbly ZERO) grams of sugar per day from foods & drinks with High Fructose Corn Syrup (HFCS) sugar as #1,2,3 or # 4 on label.[employer], [employer] do not carry products with HFCS. Follow a  low carb nutrition program such as [place] or [employer]  to prevent Diabetes progression . White carbohydrates (potatoes, rice, bread, and pasta) have a high spike of sugar and a high load of sugar. For example a  baked potato has a cup of sugar and a  french fry  2 teaspoons of sugar. Yams, wild  rice, whole grained bread &  wheat pasta have been much lower spike and load of  sugar. Portions should be the size of a deck of cards or your palm.

## 2012-09-16 NOTE — Progress Notes (Signed)
  Subjective:    Patient ID: Sheila Melton, female    DOB: 1941/07/05, 71 y.o.   MRN: [id number]  HPI She returns to followup abnormal labs. Her creatinine was 1.4; GFR 40.21; A1c 6.2%; and urine microalbumin 6.2 on 09/07/12. She is on no diabetic medications. Specifically she is not on metformin. She is on spironolactone.  She states that she was surprised by the A1c. Her fasting blood sugars have been in the 130 range; on occasion the sugar after the evening meal has been over 200. This may be related to increased intake of carbohydrates with that meal. She denies polyuria, polyphagia, polydipsia.  Blood pressure has been well controlled at home. She denies chest pain, palpitations, dyspnea, edema, or claudication.    Review of Systems  She describes upper respiratory tract symptoms for last week manifested as congestion with postnasal drainage consisting of clear secretions. She also has had some sweats. She denies frontal headache, facial pain, dental pain, otic pain, or otic discharge. She also has no fever or chills.Astelin has helped some.     Objective:   Physical Exam General appearance:good health ;well nourished; no acute distress or increased work of breathing is present.  No  lymphadenopathy about the head, neck, or axilla noted.   Eyes: No conjunctival inflammation or lid edema is present.   Ears:  External ear exam shows no significant lesions or deformities.  Otoscopic examination reveals clear canals, tympanic membranes are intact bilaterally without bulging, retraction, inflammation or discharge.  Nose:  External nasal examination shows no deformity or inflammation. Nasal mucosa are pink and moist without lesions or exudates. No septal dislocation or deviation.No obstruction to airflow.   Oral exam: Dental hygiene is good; lips and gums are healthy appearing.There is no oropharyngeal erythema or exudate noted.   Neck:  No deformities,  masses, or tenderness noted.      Heart:  Normal rate and regular rhythm. S1 and S2 normal without gallop, click, rub or other extra sounds. Grade 1/6 systolic murmur   Lungs:Chest clear to auscultation; no wheezes, rhonchi,rales ,or rubs present.No increased work of breathing.    Vasc: bilateral carotid bruits  Extremities:  No cyanosis, edema, or clubbing  noted . 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

## 2012-09-16 NOTE — Assessment & Plan Note (Signed)
Blood pressure is well controlled but elevated creatinine warrants discontinuation of the spironolactone. Blood pressure will be reassessed off this medication. Goals discussed; minimal average is less than 140/90; preferred is less than 135/85.

## 2012-09-16 NOTE — Assessment & Plan Note (Addendum)
A1c 6.2% without medications. Nutritional intervention stressed. Repeat A1c with urine microalbumin recommended in 10-12 weeks.

## 2012-09-21 ENCOUNTER — Other Ambulatory Visit: Payer: Medicare Other

## 2012-09-27 ENCOUNTER — Other Ambulatory Visit: Payer: Self-pay | Admitting: Internal Medicine

## 2012-09-27 DIAGNOSIS — E785 Hyperlipidemia, unspecified: Secondary | ICD-10-CM

## 2012-09-27 MED ORDER — PRAVASTATIN SODIUM 40 MG PO TABS: ORAL_TABLET | ORAL | Status: DC

## 2012-09-27 NOTE — Telephone Encounter (Signed)
refill Pravastatin tab 40MG  #90 take 1 tablet every  #90  last ov 11.7.13 lab follow up

## 2012-09-27 NOTE — Telephone Encounter (Signed)
Labs due early part of 2014

## 2012-10-11 ENCOUNTER — Other Ambulatory Visit: Payer: Self-pay | Admitting: Internal Medicine

## 2012-10-11 MED ORDER — LABETALOL HCL 300 MG PO TABS: 300.0000 mg | ORAL_TABLET | Freq: Two times a day (BID) | ORAL | Status: DC

## 2012-10-11 NOTE — Telephone Encounter (Signed)
refill Labetalol HCl (Tab) 300 MG Take1-by mouth twice daily. last ov 11.7.13 follow up last labs 10.29.13 Requesting 90-day supply  Last wrt [facility]

## 2012-10-11 NOTE — Telephone Encounter (Signed)
RX sent

## 2012-11-16 ENCOUNTER — Telehealth: Payer: Self-pay

## 2012-11-16 MED ORDER — VALSARTAN 320 MG PO TABS: 320.0000 mg | ORAL_TABLET | Freq: Every day | ORAL | Status: DC

## 2012-11-16 NOTE — Telephone Encounter (Signed)
Patient left a message requesting a call back from [facility]. She had some concerns about her Diovan. No other info left. Please advise     KP

## 2012-11-16 NOTE — Telephone Encounter (Signed)
Spoke with patient, patient states she needs rx for Diovan sent to [facility], patient verified dose and instructions as being correct

## 2012-11-29 ENCOUNTER — Telehealth: Payer: Self-pay | Admitting: *Deleted

## 2012-11-29 NOTE — Telephone Encounter (Signed)
Call from [facility] stating that prior auth approved from 11-25-12 until 11-25-13. Approval letter to be mailed to office.

## 2012-12-01 ENCOUNTER — Other Ambulatory Visit: Payer: Medicare Other

## 2012-12-03 ENCOUNTER — Telehealth: Payer: Self-pay | Admitting: Internal Medicine

## 2012-12-03 ENCOUNTER — Other Ambulatory Visit (INDEPENDENT_AMBULATORY_CARE_PROVIDER_SITE_OTHER): Payer: Medicare Other

## 2012-12-03 DIAGNOSIS — I10 Essential (primary) hypertension: Secondary | ICD-10-CM

## 2012-12-03 DIAGNOSIS — R7309 Other abnormal glucose: Secondary | ICD-10-CM

## 2012-12-03 LAB — BASIC METABOLIC PANEL
BUN: 17 mg/dL (ref 6–23)
Creatinine, Ser: 1.2 mg/dL (ref 0.4–1.2)
GFR: 49.1 mL/min — ABNORMAL LOW (ref >60.00)
Potassium: 3.4 mEq/L — ABNORMAL LOW (ref 3.5–5.1)

## 2012-12-03 LAB — HEMOGLOBIN A1C: Hgb A1c MFr Bld: 7.5 % — ABNORMAL HIGH (ref 4.6–6.5)

## 2012-12-03 NOTE — Telephone Encounter (Signed)
Labs mailed

## 2012-12-03 NOTE — Telephone Encounter (Signed)
PT Had labs today advised her computer crashed & would like lab results mailed to her since she will not have one when they come back

## 2012-12-14 ENCOUNTER — Ambulatory Visit (INDEPENDENT_AMBULATORY_CARE_PROVIDER_SITE_OTHER): Payer: Medicare Other | Admitting: Internal Medicine

## 2012-12-14 ENCOUNTER — Encounter: Payer: Self-pay | Admitting: Internal Medicine

## 2012-12-14 VITALS — BP 138/64 | HR 87 | Wt 153.0 lb

## 2012-12-14 DIAGNOSIS — I10 Essential (primary) hypertension: Secondary | ICD-10-CM

## 2012-12-14 DIAGNOSIS — N289 Disorder of kidney and ureter, unspecified: Secondary | ICD-10-CM

## 2012-12-14 MED ORDER — DIAZEPAM 5 MG PO TABS: 5.0000 mg | ORAL_TABLET | Freq: Every day | ORAL | Status: DC

## 2012-12-14 MED ORDER — FUROSEMIDE 40 MG PO TABS: ORAL_TABLET | ORAL | Status: DC

## 2012-12-14 MED ORDER — GLUCOSE BLOOD VI STRP: ORAL_STRIP | Status: DC

## 2012-12-14 NOTE — Assessment & Plan Note (Addendum)
Renal function has improved but potassium is low at 3.4 off spironolactone. Dietary supplementation of  the potassium would be recommended..  Blood pressure does average less than 140/90 but she does have a 3.7 cm aortic aneurysm. His blood pressure has risen off the spironolactone. Low-dose furosemide will be added to optimize blood pressure control.

## 2012-12-14 NOTE — Patient Instructions (Addendum)
To increase  Potassium (K+) increase citrus fruits & bananas in diet and use the salt substitute No Salt, which contains  potassium , to season food @ the table.  Recheck schedule fasting Labs in 4 mos : lipids,BMET,A1c , urine microalbumin. PLEASE BRING THESE INSTRUCTIONS TO FOLLOW UP  LAB APPOINTMENT.This will guarantee correct labs are drawn, eliminating need for repeat blood sampling ( needle sticks ! ). Diagnoses /Codes: 250.02, 401.9  . Please think about quitting smoking. Review the risks we discussed. Please call 1-800-QUIT-NOW ([phone]) for free smoking cessation counseling.

## 2012-12-14 NOTE — Assessment & Plan Note (Addendum)
A1c is now 7.5% off diabetic medications. She does restrict carbohydrates. Urine microalbumin has increased to 14.5%.  With her multiple comorbidities; A1c goal will be less than 8%. The most important intervention will be blood pressure control because of the aneurysm.

## 2012-12-14 NOTE — Assessment & Plan Note (Signed)
Creatinine is now normal at 1.2; urine microalbumin has increased to 14.5 off metformin and spironolactone

## 2012-12-14 NOTE — Progress Notes (Signed)
Subjective:    Patient ID: Sheila Melton, female    DOB: [birth year], 72 y.o.   MRN: [id number]  HPI Diabetes & HTN  status assessment: Fasting or morning glucose average 142 Highest glucose 2 hours after any meal is 263 X 2; typically 180-190. No hypoglycemia reported                                                                                                                 No regular exercise since back surgery No specific nutrition/diet except decreased carbs Medication compliance is good. No  Diabetic meds. Eye exam current. Foot care not current  A1c/ urine microalbumin monitor  12/03/12 7.5%/ 14.5 (average sugar  169 with long-term %  50% risk )  Blood pressure average is 139/73; range is 129-152/70-77.     Review of Systems No excess thirst ;  excess hunger ; or excess urination reported                              No lightheadedness with standing reported No chest pain ; palpitations ; claudication described .                                                                                                                             No non healing skin  ulcers or sores of extremities noted. Numbness in one toe ? from back issues                                                                                                                                             Weight loss of 5 #of pounds. No blurred,double, or loss of vision reported  .            Objective:   Physical Exam Gen.:  well-nourished in appearance.  Alert, appropriate and cooperative throughout exam.  Eyes: No corneal or conjunctival inflammation noted. No icterus Lungs: Normal respiratory effort; chest expands symmetrically. Lungs are clear to auscultation without rales, wheezes, or increased work of breathing. Decreased BS Heart: Normal rate and rhythm. Normal S1 and S2. No gallop, click, or rub. Grade 1/6 systolic murmur with neck radiation Abdomen: Bowel sounds normal; abdomen soft and nontender. No  masses, organomegaly or hernias noted. No AAA palpable Musculoskeletal/extremities: Tone & strength  normal.Joints normal . Nail health good. Able to lie down & sit up w/o help.  Vascular: Carotid, radial artery, dorsalis pedis and  posterior tibial pulses are full and equal. Neurologic: Alert and oriented x3.         Skin: Intact without suspicious lesions or rashes. Lymph: No cervical, axillary lymphadenopathy present. Psych: Mood and affect are normal. Normally interactive                                                                                         Assessment & Plan:

## 2012-12-20 ENCOUNTER — Telehealth: Payer: Self-pay | Admitting: Internal Medicine

## 2012-12-20 DIAGNOSIS — E785 Hyperlipidemia, unspecified: Secondary | ICD-10-CM

## 2012-12-20 MED ORDER — PRAVASTATIN SODIUM 40 MG PO TABS: ORAL_TABLET | ORAL | Status: DC

## 2012-12-20 NOTE — Telephone Encounter (Signed)
RX sent

## 2012-12-20 NOTE — Telephone Encounter (Signed)
refill Pravastatin (Tab) 40 MG 1 by mouth daily, requesting 90-day supply last fill not listed last wrt 11.8.13

## 2013-03-04 ENCOUNTER — Other Ambulatory Visit: Payer: Self-pay | Admitting: Internal Medicine

## 2013-03-04 NOTE — Telephone Encounter (Signed)
Med filled.  

## 2013-04-07 ENCOUNTER — Other Ambulatory Visit: Payer: Self-pay | Admitting: Internal Medicine

## 2013-04-08 NOTE — Telephone Encounter (Signed)
Patient's husband aware Controlled Substance Contract to be sign and rx to be picked up

## 2013-04-08 NOTE — Telephone Encounter (Signed)
Left message on voicemail informing patient that she will need to stop by the office to sign contract prior to refilling medications. Patient instructed to call and let me know when she will stop by so that rx will be at the front desk, ready when she arrives at the office

## 2013-04-11 ENCOUNTER — Telehealth: Payer: Self-pay | Admitting: *Deleted

## 2013-04-11 DIAGNOSIS — E785 Hyperlipidemia, unspecified: Secondary | ICD-10-CM

## 2013-04-11 DIAGNOSIS — T887XXA Unspecified adverse effect of drug or medicament, initial encounter: Secondary | ICD-10-CM

## 2013-04-11 MED ORDER — PRAVASTATIN SODIUM 40 MG PO TABS: ORAL_TABLET | ORAL | Status: DC

## 2013-04-11 MED ORDER — AMLODIPINE BESYLATE 5 MG PO TABS: 5.0000 mg | ORAL_TABLET | Freq: Two times a day (BID) | ORAL | Status: DC

## 2013-04-11 NOTE — Telephone Encounter (Signed)
Rx sent. Left message to call office to advise Pt Rx sent and to schedule appt lab orders placed.

## 2013-04-11 NOTE — Telephone Encounter (Signed)
Last Lipid 02-09-12, last OV 12-14-12, Last filled 12-20-12 #90. Please advise ok to fill pravastatin

## 2013-04-11 NOTE — Telephone Encounter (Signed)
Refill #30; schedule fasting lipids, hepatic panel, CK. Code 272.4, 995.20

## 2013-04-12 ENCOUNTER — Encounter: Payer: Self-pay | Admitting: Lab

## 2013-04-13 ENCOUNTER — Other Ambulatory Visit (INDEPENDENT_AMBULATORY_CARE_PROVIDER_SITE_OTHER): Payer: Medicare Other

## 2013-04-13 DIAGNOSIS — I10 Essential (primary) hypertension: Secondary | ICD-10-CM

## 2013-04-13 DIAGNOSIS — E1101 Type 2 diabetes mellitus with hyperosmolarity with coma: Secondary | ICD-10-CM

## 2013-04-13 DIAGNOSIS — T887XXA Unspecified adverse effect of drug or medicament, initial encounter: Secondary | ICD-10-CM

## 2013-04-13 DIAGNOSIS — E785 Hyperlipidemia, unspecified: Secondary | ICD-10-CM

## 2013-04-13 LAB — BASIC METABOLIC PANEL
GFR: 46.27 mL/min — ABNORMAL LOW (ref >60.00)
Potassium: 3.2 mEq/L — ABNORMAL LOW (ref 3.5–5.1)
Sodium: 144 mEq/L (ref 135–145)

## 2013-04-13 LAB — LIPID PANEL
Cholesterol: 179 mg/dL (ref 0–200)
LDL Cholesterol: 104 mg/dL — ABNORMAL HIGH (ref 0–99)
Triglycerides: 198 mg/dL — ABNORMAL HIGH (ref 0.0–149.0)
VLDL: 39.6 mg/dL (ref 0.0–40.0)

## 2013-04-13 LAB — HEPATIC FUNCTION PANEL
Albumin: 4.2 g/dL (ref 3.5–5.2)
Total Protein: 7.7 g/dL (ref 6.0–8.3)

## 2013-04-13 NOTE — Progress Notes (Signed)
Labs Only

## 2013-04-13 NOTE — Telephone Encounter (Signed)
Pt came in for labs today.   

## 2013-04-13 NOTE — Addendum Note (Signed)
Addended by: Valentina Gu L on: [id number] 04:18 PM   Modules accepted: Orders

## 2013-04-22 ENCOUNTER — Encounter: Payer: Self-pay | Admitting: Internal Medicine

## 2013-05-09 ENCOUNTER — Telehealth: Payer: Self-pay | Admitting: Internal Medicine

## 2013-05-09 MED ORDER — AMLODIPINE BESYLATE 5 MG PO TABS: 5.0000 mg | ORAL_TABLET | Freq: Two times a day (BID) | ORAL | Status: DC

## 2013-05-09 NOTE — Telephone Encounter (Signed)
Refill done.  

## 2013-05-09 NOTE — Telephone Encounter (Signed)
Patient called requesting new rx for amlodipine 5 mg #180 be sent to [facility].

## 2013-05-11 ENCOUNTER — Ambulatory Visit (INDEPENDENT_AMBULATORY_CARE_PROVIDER_SITE_OTHER): Payer: Medicare Other | Admitting: Internal Medicine

## 2013-05-11 ENCOUNTER — Encounter: Payer: Self-pay | Admitting: Internal Medicine

## 2013-05-11 VITALS — BP 126/78 | HR 86 | Wt 150.0 lb

## 2013-05-11 DIAGNOSIS — F17200 Nicotine dependence, unspecified, uncomplicated: Secondary | ICD-10-CM

## 2013-05-11 DIAGNOSIS — E785 Hyperlipidemia, unspecified: Secondary | ICD-10-CM

## 2013-05-11 DIAGNOSIS — N289 Disorder of kidney and ureter, unspecified: Secondary | ICD-10-CM

## 2013-05-11 DIAGNOSIS — I10 Essential (primary) hypertension: Secondary | ICD-10-CM

## 2013-05-11 NOTE — Assessment & Plan Note (Signed)
Risk discussed 

## 2013-05-11 NOTE — Progress Notes (Signed)
Subjective:    Patient ID: Sheila Melton, female    DOB: [birth year], 72 y.o.   MRN: [id number]  HPI The patient is here for followup of diabetes, hyperlipidemia, and hypertension.  The most recent A1c was 7% , which correlates to an average sugar of 154 , and long-term risk of 40% Urine microalbumin 14.9  Fasting blood sugar range 107- 160 .  Highest two-hour postprandial glucose is < 250, typically 210 after evening meal. No hypoglycemia reported On no DM meds Last ophthalmologic examination 1 year   revealed no retinopathy. No active podiatry assessment on record. Diet is heart healthy, low sugar. Exercise is housework .  The most recent lipids   reveal LDL 104 ; HDL 35.6 ; and triglycerides  198  . There is medical compliance with the statin & Lofibra   Blood pressure range 120s/60-70  . There is medical   compliance with antihypertensive medications K+ was 3.2 on Furosemide 40 mg 1/2 daily.  Her creatinine is high normal at 1.2; GFR is 46.27  No  medication adverse effects suggested        Review of Systems Constitutional: 6# loss of  weight with diet changes. No excess fatigue Eyes: No  blurred vision;  double vision ; loss of vision Cardiovascular: no chest pain ;palpitations; racing; irregular rhythm ;syncope; claudication ; edema  Respiratory: No exertional dyspnea;  paroxysmal nocturnal dyspnea Musculoskeletal: No myalgias or muscle cramping  Dermatologic: No non healing  Lesions; change in color or temperature of skin Neurologic: No  limb weakness. Intermittent R hand  numbness in am. No burning in feet Endocrine: No change in hair, skin, nails. No excessive thirst; excessive hunger;  excessive urination      Objective:   Physical Exam Gen.: Healthy and well-nourished in appearance. Alert, appropriate and cooperative throughout exam.Appears younger than stated age   Eyes: No corneal or conjunctival inflammation noted. Mouth: Oral mucosa and oropharynx reveal  no lesions or exudates. Teeth in good repair. Neck: No deformities, masses, or tenderness noted.  Thyroid normal. Lungs: Normal respiratory effort; chest expands symmetrically. Lungs are clear to auscultation without rales, wheezes, or increased work of breathing. Heart: Normal rate and rhythm. Normal S1 and S2. No gallop, click, or rub.Grade 1/2 over 6 systolic murmur. Abdomen: in support            Musculoskeletal/extremities: No clubbing, cyanosis, edema, or significant extremity  deformity noted. Tone & strength  Normal. Joints  reveal mild isolated  DJD DIP changes. Nail health good. Post traumatic cyst R index finger & ganglion L wrist  Vascular: Carotid, radial artery, dorsalis pedis and  posterior tibial pulses are  equal. Pedal pulses are decreased. L carotid  bruit present. Neurologic: Alert and oriented x3. Deep tendon reflexes symmetrical and normal.       Skin: Intact without suspicious lesions or rashes. Lymph: No cervical, axillary lymphadenopathy present. Psych: Mood and affect are normal. Normally interactive                                                                                        Assessment & Plan:  See Current Assessment &  Plan in Problem List under specific Diagnosis

## 2013-05-11 NOTE — Assessment & Plan Note (Signed)
Creatinine is now low normal; no change will be made. Renal function will be checked in 4 months

## 2013-05-11 NOTE — Assessment & Plan Note (Signed)
A1c goal should be at least 80%. He is presently 7%. This should be rechecked in 4 months. If it rises; Tradjenta should be considered as this would not have renal implications.

## 2013-05-11 NOTE — Assessment & Plan Note (Signed)
There has been a significant improvement in the triglycerides with combined pravastatin and fenofibrate. No change will be made. If the triglycerides to rise;Welchol would be  recommended in place of the fenofibrate

## 2013-05-11 NOTE — Patient Instructions (Addendum)
Please  schedule fasting Labs in 14-16 weeks after nutrition changes: BMET,Lipids,  A1c.

## 2013-05-11 NOTE — Assessment & Plan Note (Signed)
Blood pressure is well controlled at this time. I will recommend that she take the furosemide only if having edema to prevent hypokalemia

## 2013-05-23 HISTORY — PX: CYST EXCISION: SHX5701

## 2013-05-24 ENCOUNTER — Ambulatory Visit: Payer: Medicare Other | Admitting: Vascular Surgery

## 2013-06-06 ENCOUNTER — Encounter: Payer: Self-pay | Admitting: Vascular Surgery

## 2013-06-07 ENCOUNTER — Encounter (INDEPENDENT_AMBULATORY_CARE_PROVIDER_SITE_OTHER): Payer: Medicare Other | Admitting: Vascular Surgery

## 2013-06-07 ENCOUNTER — Encounter: Payer: Self-pay | Admitting: Vascular Surgery

## 2013-06-07 ENCOUNTER — Other Ambulatory Visit: Payer: Self-pay | Admitting: *Deleted

## 2013-06-07 ENCOUNTER — Ambulatory Visit (INDEPENDENT_AMBULATORY_CARE_PROVIDER_SITE_OTHER): Payer: Medicare Other | Admitting: Vascular Surgery

## 2013-06-07 VITALS — BP 129/46 | HR 73 | Resp 16 | Ht 65.0 in | Wt 148.0 lb

## 2013-06-07 DIAGNOSIS — I714 Abdominal aortic aneurysm, without rupture: Secondary | ICD-10-CM

## 2013-06-07 NOTE — Progress Notes (Signed)
Patient has today for followup of her small asymptomatic infrarenal abdominal aortic aneurysm. She has had lumbar disc surgery by Dr. pulled since my last visit with her and has done very well with this. She also has recent surgery on her right index finger for a cyst. She denies any symptoms referable to her aneurysm has no other issue of the vascular occlusive disease  Past Medical History  Diagnosis Date  . Hyperlipidemia   . Diabetes mellitus     2  . Hypertension     unspecified essential  . Family history of anesthesia complication     PATIENTS MOM HAD TROUBLE WAKING UP   . Heart murmur   . Subclavian steal syndrome   . Subclavian steal syndrome     LEFT SIDE   NO SIDE EFFECTS  . AAA (abdominal aortic aneurysm)     BEING WATCHED   VVS   . Arthritis     History  Substance Use Topics  . Smoking status: Current Every Day Smoker -- 1.00 packs/day for 50 years    Types: Cigarettes  . Smokeless tobacco: Current User  . Alcohol Use: No    Family History  Problem Relation Age of Onset  . Coronary artery disease    . Diabetes    . Heart failure Mother   . Heart disease Mother   . Hypertension Mother   . Other Mother     varicose veins  . Deep vein thrombosis Mother   . Hypertension Sister     X 2  . Diabetes Sister   . Hyperlipidemia Sister   . Other Sister     varicose veins  . Heart disease Father   . Diabetes Father   . Hyperlipidemia Father   . Hypertension Father   . Heart attack Father   . Heart disease Brother     MI in late 62s  . Hyperlipidemia Brother   . Heart attack Brother   . Parkinsonism Brother   . Heart failure Brother   . Hypertension Daughter     Allergies  Allergen Reactions  . Amoxicillin     REACTION: rash  . Captopril     REACTION: cough  . Simvastatin     REACTION: buttocks cramps  . Adhesive (Tape) Rash    Current outpatient prescriptions:amLODipine (NORVASC) 5 MG tablet, Take 1 tablet (5 mg total) by mouth 2 (two) times  daily., Disp: 180 tablet, Rfl: 1;  B Complex-C (SUPER B COMPLEX PO), Take 1 tablet by mouth daily. , Disp: , Rfl: ;  cephALEXin (KEFLEX) 500 MG capsule, 4 (four) times daily - after meals and at bedtime., Disp: , Rfl: ;  Cholecalciferol (VITAMIN D3) 2000 UNITS TABS, Take 2,000 Units by mouth daily. , Disp: , Rfl:  diazepam (VALIUM) 5 MG tablet, TAKE 1-2 TABLETS BY MOUTH AT BEDTIME, Disp: 60 tablet, Rfl: 1;  fenofibrate micronized (LOFIBRA) 134 MG capsule, TAKE 1 CAPSULE BY MOUTH BEFORE BREAKFAST, Disp: 30 capsule, Rfl: 11;  fexofenadine (ALLEGRA) 180 MG tablet, Take 180 mg by mouth at bedtime. , Disp: , Rfl: ;  furosemide (LASIX) 40 MG tablet, 1/2 qd, Disp: 30 tablet, Rfl: 3 glucose blood (ONE TOUCH ULTRA TEST) test strip, Check blood sugar once daily as directed. DX 250.00, Disp: 100 each, Rfl: 3;  Hydrocodone-Acetaminophen 5-300 MG TABS, at bedtime., Disp: , Rfl: ;  ibuprofen (ADVIL,MOTRIN) 200 MG tablet, Take 200 mg by mouth as needed., Disp: , Rfl: ;  labetalol (NORMODYNE) 300 MG tablet, Take 1  tablet (300 mg total) by mouth 2 (two) times daily., Disp: 180 tablet, Rfl: 2 pravastatin (PRAVACHOL) 40 MG tablet, 1 by mouth daily, LABS DUE 02/2013, Disp: 90 tablet, Rfl: 0;  valsartan (DIOVAN) 320 MG tablet, Take 1 tablet (320 mg total) by mouth at bedtime., Disp: 90 tablet, Rfl: 2;  vitamin C (ASCORBIC ACID) 500 MG tablet, Take 500 mg by mouth daily., Disp: , Rfl:   BP 129/46  Pulse 73  Resp 16  Ht 5\' 5"  (1.651 m)  Wt 148 lb (67.132 kg)  BMI 24.63 kg/m2  SpO2 99%  Body mass index is 24.63 kg/(m^2).       Physical exam: Well-developed well-nourished white female no acute distress Neurologically she is grossly intact Carotid arteries without bruits bilaterally 2+ radial pulses bilaterally. She does have a ganglion cyst at her left wrist. Heart is regular rate and rhythm without murmur Abdomen soft nontender no aneurysm palpable and she does have palpable femoral pulses   Noninvasive  vascular lab for same today reveals a maximal diameter of her aneurysm of 3.7 cm. This is unchanged study in May of 2013  Impression and plan: Stable small infrarenal abdominal aortic aneurysm. I discussed this at length with the patient and her husband present. There were no limitation to her activities. I have recommended yearly ultrasound to rule out any progression in size. Also described symptoms of leaking aneurysm explained this would be quite unusual to small size.

## 2013-07-19 ENCOUNTER — Other Ambulatory Visit: Payer: Self-pay | Admitting: *Deleted

## 2013-07-19 DIAGNOSIS — E785 Hyperlipidemia, unspecified: Secondary | ICD-10-CM

## 2013-07-19 MED ORDER — PRAVASTATIN SODIUM 40 MG PO TABS: ORAL_TABLET | ORAL | Status: DC

## 2013-07-19 NOTE — Telephone Encounter (Signed)
Rx was refilled for pravastatin 40 mg. Ag cma

## 2013-07-28 ENCOUNTER — Telehealth: Payer: Self-pay | Admitting: Internal Medicine

## 2013-07-28 DIAGNOSIS — E781 Pure hyperglyceridemia: Secondary | ICD-10-CM

## 2013-07-28 MED ORDER — FENOFIBRATE MICRONIZED 134 MG PO CAPS: ORAL_CAPSULE | ORAL | Status: DC

## 2013-07-28 NOTE — Telephone Encounter (Signed)
Rx for fenofibrate sent to [facility] with a 90 day supply

## 2013-07-28 NOTE — Telephone Encounter (Signed)
Patient is asking that we send her Fenofibrate rx to [facility] order pharmacy. She says they recommend a 90-day supply. She no longer wants it sent to [facility].

## 2013-08-02 ENCOUNTER — Other Ambulatory Visit: Payer: Self-pay | Admitting: Internal Medicine

## 2013-08-02 ENCOUNTER — Telehealth: Payer: Self-pay | Admitting: Internal Medicine

## 2013-08-02 NOTE — Telephone Encounter (Signed)
Refill: Labetalol tab 300 mg. Take 1 by mouth twice daily. 90 day supply

## 2013-08-03 ENCOUNTER — Telehealth: Payer: Self-pay | Admitting: *Deleted

## 2013-08-03 ENCOUNTER — Other Ambulatory Visit: Payer: Self-pay | Admitting: Internal Medicine

## 2013-08-03 NOTE — Telephone Encounter (Signed)
Med filled.  

## 2013-08-03 NOTE — Telephone Encounter (Signed)
OK X1 

## 2013-08-03 NOTE — Telephone Encounter (Signed)
Called to inform patient that Valium prescription is ready to be picked up and that a UDS is required.

## 2013-08-03 NOTE — Telephone Encounter (Signed)
Last OV 05-11-13 Med filled 04-07-13 #60 with 1 refill    Low Risk

## 2013-08-05 ENCOUNTER — Other Ambulatory Visit: Payer: Self-pay | Admitting: General Practice

## 2013-08-05 MED ORDER — LABETALOL HCL 300 MG PO TABS: 300.0000 mg | ORAL_TABLET | Freq: Two times a day (BID) | ORAL | Status: DC

## 2013-08-05 NOTE — Telephone Encounter (Signed)
Rx refilled on 08-05-13.//AB/CMA

## 2013-08-18 ENCOUNTER — Ambulatory Visit: Payer: Medicare Other

## 2013-08-24 ENCOUNTER — Ambulatory Visit (INDEPENDENT_AMBULATORY_CARE_PROVIDER_SITE_OTHER): Payer: Medicare Other | Admitting: *Deleted

## 2013-08-24 ENCOUNTER — Other Ambulatory Visit (INDEPENDENT_AMBULATORY_CARE_PROVIDER_SITE_OTHER): Payer: Medicare Other

## 2013-08-24 ENCOUNTER — Telehealth: Payer: Self-pay | Admitting: Internal Medicine

## 2013-08-24 DIAGNOSIS — N390 Urinary tract infection, site not specified: Secondary | ICD-10-CM

## 2013-08-24 DIAGNOSIS — I10 Essential (primary) hypertension: Secondary | ICD-10-CM

## 2013-08-24 DIAGNOSIS — Z23 Encounter for immunization: Secondary | ICD-10-CM

## 2013-08-24 LAB — BASIC METABOLIC PANEL
BUN: 24 mg/dL — ABNORMAL HIGH (ref 6–23)
Calcium: 9.7 mg/dL (ref 8.4–10.5)
GFR: 46.23 mL/min — ABNORMAL LOW (ref >60.00)
Potassium: 3.6 mEq/L (ref 3.5–5.1)
Sodium: 139 mEq/L (ref 135–145)

## 2013-08-24 LAB — MICROALBUMIN / CREATININE URINE RATIO
Creatinine,U: 66.9 mg/dL
Microalb Creat Ratio: 56.4 mg/g — ABNORMAL HIGH (ref 0.0–30.0)
Microalb, Ur: 37.7 mg/dL — ABNORMAL HIGH (ref 0.0–1.9)

## 2013-08-24 LAB — HEMOGLOBIN A1C: Hgb A1c MFr Bld: 7.6 % — ABNORMAL HIGH (ref 4.6–6.5)

## 2013-08-24 MED ORDER — VALSARTAN 320 MG PO TABS: 320.0000 mg | ORAL_TABLET | Freq: Every day | ORAL | Status: DC

## 2013-08-24 NOTE — Telephone Encounter (Signed)
Patient is calling to request a refill for her valsartan (DIOVAN) 320 MG rx to be sent to [facility]. She has enough left for a couple weeks but wants to make sure the refill is sent to them in time. Please advise.

## 2013-08-24 NOTE — Telephone Encounter (Signed)
Medication refilled

## 2013-08-24 NOTE — Telephone Encounter (Signed)
Valsartan refill sent to [facility]

## 2013-08-26 ENCOUNTER — Other Ambulatory Visit: Payer: Self-pay | Admitting: *Deleted

## 2013-08-26 ENCOUNTER — Telehealth: Payer: Self-pay | Admitting: *Deleted

## 2013-08-26 ENCOUNTER — Telehealth: Payer: Self-pay | Admitting: Internal Medicine

## 2013-08-26 DIAGNOSIS — R350 Frequency of micturition: Secondary | ICD-10-CM

## 2013-08-26 DIAGNOSIS — N390 Urinary tract infection, site not specified: Secondary | ICD-10-CM

## 2013-08-26 DIAGNOSIS — R300 Dysuria: Secondary | ICD-10-CM

## 2013-08-26 HISTORY — DX: Urinary tract infection, site not specified: N39.0

## 2013-08-26 LAB — POCT URINALYSIS DIPSTICK
Nitrite, UA: NEGATIVE
Protein, UA: NEGATIVE
Spec Grav, UA: <=1.005
Urobilinogen, UA: 0.2
pH, UA: 6.5

## 2013-08-26 MED ORDER — NITROFURANTOIN MONOHYD MACRO 100 MG PO CAPS: 100.0000 mg | ORAL_CAPSULE | Freq: Two times a day (BID) | ORAL | Status: DC

## 2013-08-26 NOTE — Telephone Encounter (Signed)
Patient is going to come by today for a UA.

## 2013-08-26 NOTE — Telephone Encounter (Signed)
macrobid sent to pharmacy. Patient aware

## 2013-08-26 NOTE — Telephone Encounter (Signed)
Patient came by today and had a UA. Macrobid sent to pharmacy

## 2013-08-26 NOTE — Telephone Encounter (Signed)
Patient is calling for the results of her urine sample from Wed, 10/15. She states that she is almost sure she has a kidney infection because she is frequently urinating. Wants to know what Dr. Linna Darner would like for her to do. Please advise.

## 2013-08-26 NOTE — Telephone Encounter (Signed)
Urinalysis was collected for urine microalbumin to assess diabetic kidney disease. It was not checked for infection; unfortunately you  need come in and submit another urine.

## 2013-08-29 LAB — URINE CULTURE

## 2013-08-30 ENCOUNTER — Other Ambulatory Visit: Payer: Self-pay | Admitting: *Deleted

## 2013-08-30 DIAGNOSIS — R350 Frequency of micturition: Secondary | ICD-10-CM

## 2013-08-30 DIAGNOSIS — N390 Urinary tract infection, site not specified: Secondary | ICD-10-CM

## 2013-08-30 MED ORDER — NITROFURANTOIN MONOHYD MACRO 100 MG PO CAPS: 100.0000 mg | ORAL_CAPSULE | Freq: Two times a day (BID) | ORAL | Status: DC

## 2013-08-30 NOTE — Telephone Encounter (Signed)
Spoke with pt made aware that she needs an additional abx, [facility]

## 2013-09-02 ENCOUNTER — Encounter (HOSPITAL_COMMUNITY): Payer: Self-pay | Admitting: Emergency Medicine

## 2013-09-02 ENCOUNTER — Inpatient Hospital Stay (HOSPITAL_COMMUNITY)
Admission: EM | Admit: 2013-09-02 | Discharge: 2013-09-05 | DRG: 871 | Disposition: A | Discharge status: Home / Self Care | Payer: Medicare Other | Attending: Internal Medicine | Admitting: Internal Medicine

## 2013-09-02 ENCOUNTER — Telehealth: Payer: Self-pay | Admitting: Internal Medicine

## 2013-09-02 ENCOUNTER — Emergency Department (HOSPITAL_COMMUNITY): Payer: Medicare Other

## 2013-09-02 DIAGNOSIS — R21 Rash and other nonspecific skin eruption: Secondary | ICD-10-CM | POA: Diagnosis present

## 2013-09-02 DIAGNOSIS — I714 Abdominal aortic aneurysm, without rupture, unspecified: Secondary | ICD-10-CM | POA: Diagnosis present

## 2013-09-02 DIAGNOSIS — E876 Hypokalemia: Secondary | ICD-10-CM

## 2013-09-02 DIAGNOSIS — J441 Chronic obstructive pulmonary disease with (acute) exacerbation: Secondary | ICD-10-CM

## 2013-09-02 DIAGNOSIS — R6510 Systemic inflammatory response syndrome (SIRS) of non-infectious origin without acute organ dysfunction: Secondary | ICD-10-CM | POA: Diagnosis present

## 2013-09-02 DIAGNOSIS — F17200 Nicotine dependence, unspecified, uncomplicated: Secondary | ICD-10-CM

## 2013-09-02 DIAGNOSIS — I152 Hypertension secondary to endocrine disorders: Secondary | ICD-10-CM | POA: Diagnosis present

## 2013-09-02 DIAGNOSIS — R0989 Other specified symptoms and signs involving the circulatory and respiratory systems: Secondary | ICD-10-CM

## 2013-09-02 DIAGNOSIS — Z79899 Other long term (current) drug therapy: Secondary | ICD-10-CM

## 2013-09-02 DIAGNOSIS — I10 Essential (primary) hypertension: Secondary | ICD-10-CM | POA: Diagnosis present

## 2013-09-02 DIAGNOSIS — E785 Hyperlipidemia, unspecified: Secondary | ICD-10-CM | POA: Diagnosis present

## 2013-09-02 DIAGNOSIS — R079 Chest pain, unspecified: Secondary | ICD-10-CM

## 2013-09-02 DIAGNOSIS — R197 Diarrhea, unspecified: Secondary | ICD-10-CM

## 2013-09-02 DIAGNOSIS — A419 Sepsis, unspecified organism: Principal | ICD-10-CM | POA: Diagnosis present

## 2013-09-02 DIAGNOSIS — G458 Other transient cerebral ischemic attacks and related syndromes: Secondary | ICD-10-CM

## 2013-09-02 DIAGNOSIS — R002 Palpitations: Secondary | ICD-10-CM

## 2013-09-02 DIAGNOSIS — G9519 Other vascular myelopathies: Secondary | ICD-10-CM

## 2013-09-02 DIAGNOSIS — M4310 Spondylolisthesis, site unspecified: Secondary | ICD-10-CM

## 2013-09-02 DIAGNOSIS — N281 Cyst of kidney, acquired: Secondary | ICD-10-CM

## 2013-09-02 DIAGNOSIS — N289 Disorder of kidney and ureter, unspecified: Secondary | ICD-10-CM

## 2013-09-02 DIAGNOSIS — M549 Dorsalgia, unspecified: Secondary | ICD-10-CM

## 2013-09-02 DIAGNOSIS — R011 Cardiac murmur, unspecified: Secondary | ICD-10-CM

## 2013-09-02 DIAGNOSIS — IMO0001 Reserved for inherently not codable concepts without codable children: Secondary | ICD-10-CM | POA: Diagnosis present

## 2013-09-02 DIAGNOSIS — I739 Peripheral vascular disease, unspecified: Secondary | ICD-10-CM

## 2013-09-02 DIAGNOSIS — N390 Urinary tract infection, site not specified: Secondary | ICD-10-CM | POA: Diagnosis present

## 2013-09-02 DIAGNOSIS — J189 Pneumonia, unspecified organism: Secondary | ICD-10-CM

## 2013-09-02 LAB — COMPREHENSIVE METABOLIC PANEL
AST: 20 U/L (ref 0–37)
Albumin: 3.6 g/dL (ref 3.5–5.2)
CO2: 26 mEq/L (ref 19–32)
Calcium: 10.3 mg/dL (ref 8.4–10.5)
Creatinine, Ser: 1.1 mg/dL (ref 0.50–1.10)
GFR calc non Af Amer: 50 mL/min — ABNORMAL LOW (ref >90)
Potassium: 3.6 mEq/L (ref 3.5–5.1)
Sodium: 135 mEq/L (ref 135–145)
Total Protein: 7.3 g/dL (ref 6.0–8.3)

## 2013-09-02 LAB — CBC WITH DIFFERENTIAL/PLATELET
Basophils Absolute: 0 10*3/uL (ref 0.0–0.1)
Basophils Relative: 0 % (ref 0–1)
Eosinophils Relative: 3 % (ref 0–5)
HCT: 39.2 % (ref 36.0–46.0)
MCHC: 33.2 g/dL (ref 30.0–36.0)
MCV: 87.3 fL (ref 78.0–100.0)
Monocytes Absolute: 2.2 10*3/uL — ABNORMAL HIGH (ref 0.1–1.0)
Neutrophils Relative %: 80 % — ABNORMAL HIGH (ref 43–77)
RBC: 4.49 MIL/uL (ref 3.87–5.11)
RDW: 13.4 % (ref 11.5–15.5)

## 2013-09-02 LAB — LACTIC ACID, PLASMA: Lactic Acid, Venous: 2.8 mmol/L — ABNORMAL HIGH (ref 0.5–2.2)

## 2013-09-02 LAB — URINALYSIS, ROUTINE W REFLEX MICROSCOPIC
Glucose, UA: NEGATIVE mg/dL
Hgb urine dipstick: NEGATIVE
Ketones, ur: NEGATIVE mg/dL
Protein, ur: 30 mg/dL — AB

## 2013-09-02 LAB — TROPONIN I: Troponin I: <0.3 ng/mL (ref <0.30)

## 2013-09-02 LAB — URINE MICROSCOPIC-ADD ON

## 2013-09-02 LAB — GLUCOSE, CAPILLARY

## 2013-09-02 LAB — POCT I-STAT TROPONIN I: Troponin i, poc: 0 ng/mL (ref 0.00–0.08)

## 2013-09-02 MED ORDER — SODIUM CHLORIDE 0.9 % IJ SOLN
3.0000 mL | Freq: Two times a day (BID) | INTRAMUSCULAR | Status: DC
  Administered 2013-09-03 – 2013-09-04 (×3): 3 mL via INTRAVENOUS

## 2013-09-02 MED ORDER — INSULIN ASPART 100 UNIT/ML ~~LOC~~ SOLN
0.0000 [IU] | Freq: Three times a day (TID) | SUBCUTANEOUS | Status: DC
  Administered 2013-09-03: 13:00:00 1 [IU] via SUBCUTANEOUS
  Administered 2013-09-03 (×2): 2 [IU] via SUBCUTANEOUS
  Administered 2013-09-04: 1 [IU] via SUBCUTANEOUS
  Administered 2013-09-04: 09:00:00 2 [IU] via SUBCUTANEOUS
  Administered 2013-09-04: 13:00:00 1 [IU] via SUBCUTANEOUS
  Administered 2013-09-05: 08:00:00 2 [IU] via SUBCUTANEOUS

## 2013-09-02 MED ORDER — SIMVASTATIN 20 MG PO TABS: 20.0000 mg | ORAL_TABLET | Freq: Every day | ORAL | Status: DC

## 2013-09-02 MED ORDER — LORATADINE 10 MG PO TABS: 10.0000 mg | ORAL_TABLET | Freq: Every day | ORAL | Status: DC

## 2013-09-02 MED ORDER — ONDANSETRON HCL 4 MG PO TABS: 4.0000 mg | ORAL_TABLET | Freq: Four times a day (QID) | ORAL | Status: DC | PRN

## 2013-09-02 MED ORDER — SODIUM CHLORIDE 0.9 % IV BOLUS (SEPSIS): 1000.0000 mL | Freq: Once | INTRAVENOUS | Status: DC

## 2013-09-02 MED ORDER — AZITHROMYCIN 250 MG PO TABS
500.0000 mg | ORAL_TABLET | Freq: Once | ORAL | Status: AC
  Administered 2013-09-02: 500 mg via ORAL
  Filled 2013-09-02: qty 2

## 2013-09-02 MED ORDER — PRAVASTATIN SODIUM 40 MG PO TABS
40.0000 mg | ORAL_TABLET | Freq: Every day | ORAL | Status: DC
  Administered 2013-09-03 – 2013-09-04 (×2): 40 mg via ORAL
  Filled 2013-09-02 (×3): qty 1

## 2013-09-02 MED ORDER — LORATADINE 10 MG PO TABS
10.0000 mg | ORAL_TABLET | Freq: Every day | ORAL | Status: DC
  Administered 2013-09-03 – 2013-09-04 (×3): 10 mg via ORAL
  Filled 2013-09-02 (×4): qty 1

## 2013-09-02 MED ORDER — ACETAMINOPHEN 325 MG PO TABS
650.0000 mg | ORAL_TABLET | Freq: Four times a day (QID) | ORAL | Status: DC | PRN
  Administered 2013-09-03: 650 mg via ORAL
  Filled 2013-09-02 (×2): qty 2

## 2013-09-02 MED ORDER — AMLODIPINE BESYLATE 5 MG PO TABS
5.0000 mg | ORAL_TABLET | Freq: Two times a day (BID) | ORAL | Status: DC
Start: 2013-09-02 — End: 2013-09-05
  Administered 2013-09-02 – 2013-09-05 (×6): 5 mg via ORAL
  Filled 2013-09-02 (×7): qty 1

## 2013-09-02 MED ORDER — AZITHROMYCIN 500 MG IV SOLR
500.0000 mg | INTRAVENOUS | Status: DC
  Administered 2013-09-03: 18:00:00 500 mg via INTRAVENOUS
  Filled 2013-09-02: qty 500

## 2013-09-02 MED ORDER — ACETAMINOPHEN 500 MG PO TABS
1000.0000 mg | ORAL_TABLET | Freq: Once | ORAL | Status: DC
  Filled 2013-09-02: qty 2

## 2013-09-02 MED ORDER — FENOFIBRATE 160 MG PO TABS
160.0000 mg | ORAL_TABLET | Freq: Every day | ORAL | Status: DC
  Administered 2013-09-03 – 2013-09-05 (×3): 160 mg via ORAL
  Filled 2013-09-02 (×3): qty 1

## 2013-09-02 MED ORDER — SODIUM CHLORIDE 0.9 % IV SOLN
INTRAVENOUS | Status: AC
  Administered 2013-09-02 – 2013-09-03 (×3): via INTRAVENOUS

## 2013-09-02 MED ORDER — DEXTROSE 5 % IV SOLN
1.0000 g | INTRAVENOUS | Status: DC
  Administered 2013-09-03: 1 g via INTRAVENOUS
  Filled 2013-09-02: qty 10

## 2013-09-02 MED ORDER — DEXTROSE 5 % IV SOLN
1.0000 g | Freq: Once | INTRAVENOUS | Status: AC
  Administered 2013-09-02: 21:00:00 via INTRAVENOUS
  Filled 2013-09-02: qty 10

## 2013-09-02 MED ORDER — LABETALOL HCL 300 MG PO TABS
300.0000 mg | ORAL_TABLET | Freq: Two times a day (BID) | ORAL | Status: DC
  Administered 2013-09-02 – 2013-09-05 (×6): 300 mg via ORAL
  Filled 2013-09-02 (×7): qty 1

## 2013-09-02 MED ORDER — ONDANSETRON HCL 4 MG/2ML IJ SOLN: 4.0000 mg | Freq: Four times a day (QID) | INTRAMUSCULAR | Status: DC | PRN

## 2013-09-02 MED ORDER — ENOXAPARIN SODIUM 40 MG/0.4ML ~~LOC~~ SOLN
40.0000 mg | Freq: Every day | SUBCUTANEOUS | Status: DC
  Administered 2013-09-02 – 2013-09-04 (×3): 40 mg via SUBCUTANEOUS
  Filled 2013-09-02 (×4): qty 0.4

## 2013-09-02 MED ORDER — ACETAMINOPHEN 650 MG RE SUPP: 650.0000 mg | Freq: Four times a day (QID) | RECTAL | Status: DC | PRN

## 2013-09-02 MED ORDER — DIAZEPAM 5 MG PO TABS
5.0000 mg | ORAL_TABLET | Freq: Every day | ORAL | Status: DC
  Administered 2013-09-02 – 2013-09-04 (×3): 5 mg via ORAL
  Filled 2013-09-02 (×3): qty 1

## 2013-09-02 MED ORDER — VITAMIN D3 25 MCG (1000 UNIT) PO TABS
2000.0000 [IU] | ORAL_TABLET | Freq: Every day | ORAL | Status: DC
  Administered 2013-09-03 – 2013-09-05 (×3): 2000 [IU] via ORAL
  Filled 2013-09-02 (×3): qty 2

## 2013-09-02 MED ORDER — IRBESARTAN 300 MG PO TABS
300.0000 mg | ORAL_TABLET | Freq: Every day | ORAL | Status: DC
  Administered 2013-09-03 – 2013-09-05 (×3): 300 mg via ORAL
  Filled 2013-09-02 (×3): qty 1

## 2013-09-02 NOTE — H&P (Addendum)
[facility] History and Physical  [name] [id number] DOB: [birth year] DOA: 09/02/2013  Referring physician: ER physician. PCP: Unice Cobble, MD   Chief Complaint: Fever chills and weakness.  HPI: Sheila Melton is a 72 y.o. female history of diabetes mellitus presently on no medications, chronic kidney disease, hypertension and hyperlipidemia presented to the ER because of fever chills and weakness with nausea. Patient was having increased frequency of urination last week and had gone to her PCP who had placed patient on Macrobid after patient was found to have UA consistent with UTI. Patient has been taking Macrobid for last 7 days. Yesterday patient started experiencing fever and chills and today morning patient got weak and myalgia. Patient started having some chest tightness but denies any shortness of breath. Patient had some nausea but denies any vomiting and had one episode diarrhea earlier in the morning. Due to her symptoms patient came to the ER and was found to be febrile with UA consistent with UTI and chest x-ray showing possible infiltrates. Patient has been placed on antibiotics after blood cultures and urine cultures were obtained.  Review of Systems: As presented in the history of presenting illness, rest negative.  Past Medical History  Diagnosis Date  . Hyperlipidemia   . Diabetes mellitus     2  . Hypertension     unspecified essential  . Family history of anesthesia complication     PATIENTS MOM HAD TROUBLE WAKING UP   . Heart murmur   . Subclavian steal syndrome   . Subclavian steal syndrome     LEFT SIDE   NO SIDE EFFECTS  . AAA (abdominal aortic aneurysm)     BEING WATCHED   VVS   . Arthritis    Past Surgical History  Procedure Laterality Date  . Cataract extraction      OS  . G1 p1    . Abdominal hysterectomy    . Dilation and curettage of uterus    . Cyst excision Right 05-23-13    Right index finger: cyst  . Spine surgery  Sept. 2013   . Eye surgery     Social History:  reports that she has been smoking Cigarettes.  She has a 50 pack-year smoking history. She uses smokeless tobacco. She reports that she does not drink alcohol or use illicit drugs. Where does patient live home. Can patient participate in ADLs? Yes.  Allergies  Allergen Reactions  . Amoxicillin     REACTION: rash  . Captopril     REACTION: cough  . Simvastatin     REACTION: buttocks cramps  . Adhesive [Tape] Rash    Family History:  Family History  Problem Relation Age of Onset  . Coronary artery disease    . Diabetes    . Heart failure Mother   . Heart disease Mother   . Hypertension Mother   . Other Mother     varicose veins  . Deep vein thrombosis Mother   . Hypertension Sister     X 2  . Diabetes Sister   . Hyperlipidemia Sister   . Other Sister     varicose veins  . Heart disease Father   . Diabetes Father   . Hyperlipidemia Father   . Hypertension Father   . Heart attack Father   . Heart disease Brother     MI in late 68s  . Hyperlipidemia Brother   . Heart attack Brother   . Parkinsonism Brother   . Heart  failure Brother   . Hypertension Daughter       Prior to Admission medications   Medication Sig Start Date End Date Taking? Authorizing Provider  acetaminophen (TYLENOL) 500 MG tablet Take 500 mg by mouth every 6 (six) hours as needed for pain.   Yes Historical Provider, MD  amLODipine (NORVASC) 5 MG tablet Take 1 tablet (5 mg total) by mouth 2 (two) times daily. 05/09/13  Yes Hendricks Limes, MD  B Complex-C (SUPER B COMPLEX PO) Take 1 tablet by mouth daily.    Yes Historical Provider, MD  Cholecalciferol (VITAMIN D3) 2000 UNITS TABS Take 2,000 Units by mouth daily.    Yes Historical Provider, MD  diazepam (VALIUM) 5 MG tablet Take 5 mg by mouth at bedtime.   Yes Historical Provider, MD  fenofibrate micronized (LOFIBRA) 134 MG capsule Take 134 mg by mouth daily before breakfast.   Yes Historical Provider, MD   fexofenadine (ALLEGRA) 180 MG tablet Take 180 mg by mouth at bedtime.    Yes Historical Provider, MD  furosemide (LASIX) 40 MG tablet Take 20 mg by mouth every other day.   Yes Historical Provider, MD  labetalol (NORMODYNE) 300 MG tablet Take 300 mg by mouth 2 (two) times daily. 08/05/13  Yes Hendricks Limes, MD  nitrofurantoin, macrocrystal-monohydrate, (MACROBID) 100 MG capsule Take 1 capsule (100 mg total) by mouth 2 (two) times daily. 08/30/13  Yes Hendricks Limes, MD  pravastatin (PRAVACHOL) 40 MG tablet 1 by mouth daily 07/19/13  Yes Hendricks Limes, MD  valsartan (DIOVAN) 320 MG tablet Take 1 tablet (320 mg total) by mouth at bedtime. 08/24/13  Yes Hendricks Limes, MD  vitamin E 400 UNIT capsule Take 400 Units by mouth daily.   Yes Historical Provider, MD    Physical Exam: Filed Vitals:   09/02/13 1846  BP: 177/60  Pulse: 105  Temp: 100.7 F (38.2 C)  TempSrc: Oral  Resp: 16  Height: 5' 4.5" (1.638 m)  Weight: 68.04 kg (150 lb)  SpO2: 95%     General:  Well-developed well-nourished.  Eyes: Anicteric no pallor.  ENT: No discharge from ears eyes nose mouth.  Neck: No mass felt.  Cardiovascular: S1-S2 heard.  Respiratory: No rhonchi or crepitations.  Abdomen: Soft nontender bowel sounds present.  Skin: No rash.  Musculoskeletal: No edema.  Psychiatric: Appears normal.  Neurologic: Alert awake oriented to time place and person. Moves all extremities.  Labs on Admission:  Basic Metabolic Panel:  Recent Labs Lab 09/02/13 1952  NA 135  K 3.6  CL 98  CO2 26  GLUCOSE 244*  BUN 16  CREATININE 1.10  CALCIUM 10.3   Liver Function Tests:  Recent Labs Lab 09/02/13 1952  AST 20  ALT 16  ALKPHOS 93  BILITOT 0.4  PROT 7.3  ALBUMIN 3.6   No results found for this basename: LIPASE, AMYLASE,  in the last 168 hours No results found for this basename: AMMONIA,  in the last 168 hours CBC:  Recent Labs Lab 09/02/13 1952  WBC 21.8*  NEUTROABS 17.4*   HGB 13.0  HCT 39.2  MCV 87.3  PLT 317   Cardiac Enzymes: No results found for this basename: CKTOTAL, CKMB, CKMBINDEX, TROPONINI,  in the last 168 hours  BNP (last 3 results) No results found for this basename: PROBNP,  in the last 8760 hours CBG: No results found for this basename: GLUCAP,  in the last 168 hours  Radiological Exams on Admission: Dg Chest 2  View  09/02/2013   CLINICAL DATA:  Fever, chest pain.  EXAM: CHEST  2 VIEW  COMPARISON:  July 22, 2012.  FINDINGS: The heart size and mediastinal contours are within normal limits. There is the interval development of interstitial densities involving both lung bases, left worse than right. The visualized skeletal structures are unremarkable.  IMPRESSION: Interval development of bilateral basilar interstitial densities, with left worse than right. This may represent edema or pneumonia.   Electronically Signed   By: Sabino Dick M.D.   On: 09/02/2013 19:37    EKG: Independently reviewed. Sinus tachycardia with nonspecific T-wave changes in the lateral leads.  Assessment/Plan Principal Problem:   SIRS (systemic inflammatory response syndrome) Active Problems:   Unspecified essential hypertension   DIABETES MELLITUS, UNCONTROLLED   UTI (lower urinary tract infection)   1. SIRS probably from UTI - patient has been placed on IV antibiotics, ceftriaxone and Zithromax(Zithromax has been added for possible pneumonia). Continue with IV hydration. Follow cultures. We will check influenza PCR and also check for urine Legionella and strep antigen. Since patient had one episode of diarrhea checks stool for C. difficile. Closely monitor in telemetry for now. 2. Chest discomfort - patient has some chest discomfort earlier in the day. Presently patient is chest pain-free. Since EKG were showing nonspecific T-wave changes cardiac markers have been ordered. 3. Diabetes mellitus type 2 uncontrolled - presently not on medications. Have placed  patient on sliding-scale coverage for now. 4. Hypertension - continue home medications. 5. Hyperlipidemia - continue home medications. 6. History of abdominal aortic aneurysm - denies any abdominal pain at this time. Being followed by vascular surgery.    Code Status: Full code.  Family Communication:  Patient's husband and daughter at the bedside.  Disposition Plan: Admit to inpatient.    Grayland Daisey N. [facility] Pager [phone].  If 7PM-7AM, please contact night-coverage [email or web address] Password [facility] 09/02/2013, 9:21 PM

## 2013-09-02 NOTE — ED Notes (Signed)
Pt states she has been taking nitrofurantoin abx for the past 7 days. Pt states she still has burning with urination and pain to lower back near buttocks. Pt also states she has chills and body aches. Pt with no acute distress. Skin warm and dry.

## 2013-09-02 NOTE — ED Notes (Signed)
Pt states she is currently treated for UTI and she is having chills and she called her PCP and she was told to come to ED

## 2013-09-02 NOTE — Telephone Encounter (Signed)
Husband called for triage regarding chills, diarrhea, weakness, aches, and nausea.  Called to the home and let the phone ring numerous times.  No answer.  No answering machine.

## 2013-09-02 NOTE — ED Notes (Signed)
Pt states couple days ago started having chills, body aches, nausea, diarrhea, states she also thinks she may have a kidney infection d/t burning w/ urination.

## 2013-09-02 NOTE — ED Provider Notes (Signed)
TIME SEEN: 7:04 PM  CHIEF COMPLAINT: Fevers, chills, nausea, diarrhea, dysuria, chest tightness  HPI: Patient is a 72 year old female with a history of hypertension, diabetes, hyperlipidemia, AAA who presents emergency department with 3 days of subjective fevers, chills, myalgias, nausea and diarrhea. She also reports that she's had a week and a half of dysuria and has been treated for urinary tract infection with Macrobid which she is still taking. She denies any improvement in her dysuria. No hematuria. No vaginal bleeding or discharge. She has also had mild chest tightness has been constant for the past 2 days. Not exertional or pleuritic. No shortness of breath. She has had mild lightheadedness.  ROS: See HPI Constitutional: fever  Eyes: no drainage  ENT: no runny nose   Cardiovascular:  no chest pain  Resp: no SOB  GI: no vomiting GU: dysuria Integumentary: no rash  Allergy: no hives  Musculoskeletal: no leg swelling  Neurological: no slurred speech ROS otherwise negative  PAST MEDICAL HISTORY/PAST SURGICAL HISTORY:  Past Medical History  Diagnosis Date  . Hyperlipidemia   . Diabetes mellitus     2  . Hypertension     unspecified essential  . Family history of anesthesia complication     PATIENTS MOM HAD TROUBLE WAKING UP   . Heart murmur   . Subclavian steal syndrome   . Subclavian steal syndrome     LEFT SIDE   NO SIDE EFFECTS  . AAA (abdominal aortic aneurysm)     BEING WATCHED   VVS   . Arthritis     MEDICATIONS:  Prior to Admission medications   Medication Sig Start Date End Date Taking? Authorizing Provider  amLODipine (NORVASC) 5 MG tablet Take 1 tablet (5 mg total) by mouth 2 (two) times daily. 05/09/13   Hendricks Limes, MD  B Complex-C (SUPER B COMPLEX PO) Take 1 tablet by mouth daily.     Historical Provider, MD  cephALEXin (KEFLEX) 500 MG capsule 4 (four) times daily - after meals and at bedtime. 05/30/13   Historical Provider, MD  Cholecalciferol (VITAMIN  D3) 2000 UNITS TABS Take 2,000 Units by mouth daily.     Historical Provider, MD  diazepam (VALIUM) 5 MG tablet TAKE 1 TO 2 TABLETS AT BEDTIME 08/02/13   Hendricks Limes, MD  fenofibrate micronized (LOFIBRA) 134 MG capsule TAKE 1 CAPSULE BY MOUTH BEFORE BREAKFAST 07/28/13   Hendricks Limes, MD  fexofenadine (ALLEGRA) 180 MG tablet Take 180 mg by mouth at bedtime.     Historical Provider, MD  furosemide (LASIX) 40 MG tablet TAKE 1/2 TABLET BY MOUTH DAILY 08/03/13   Hendricks Limes, MD  glucose blood (ONE TOUCH ULTRA TEST) test strip Check blood sugar once daily as directed. DX 250.00 12/14/12 12/14/13  Hendricks Limes, MD  Hydrocodone-Acetaminophen 5-300 MG TABS at bedtime. 05/30/13   Historical Provider, MD  ibuprofen (ADVIL,MOTRIN) 200 MG tablet Take 200 mg by mouth as needed.    Historical Provider, MD  labetalol (NORMODYNE) 300 MG tablet Take 1 tablet (300 mg total) by mouth 2 (two) times daily. 08/05/13   Hendricks Limes, MD  nitrofurantoin, macrocrystal-monohydrate, (MACROBID) 100 MG capsule Take 1 capsule (100 mg total) by mouth 2 (two) times daily. 08/30/13   Hendricks Limes, MD  pravastatin (PRAVACHOL) 40 MG tablet 1 by mouth daily 07/19/13   Hendricks Limes, MD  valsartan (DIOVAN) 320 MG tablet Take 1 tablet (320 mg total) by mouth at bedtime. 08/24/13   Darrick Penna  Linna Darner, MD  vitamin C (ASCORBIC ACID) 500 MG tablet Take 500 mg by mouth daily.    Historical Provider, MD    ALLERGIES:  Allergies  Allergen Reactions  . Amoxicillin     REACTION: rash  . Captopril     REACTION: cough  . Simvastatin     REACTION: buttocks cramps  . Adhesive [Tape] Rash    SOCIAL HISTORY:  History  Substance Use Topics  . Smoking status: Current Every Day Smoker -- 1.00 packs/day for 50 years    Types: Cigarettes  . Smokeless tobacco: Current User  . Alcohol Use: No    FAMILY HISTORY: Family History  Problem Relation Age of Onset  . Coronary artery disease    . Diabetes    . Heart failure  Mother   . Heart disease Mother   . Hypertension Mother   . Other Mother     varicose veins  . Deep vein thrombosis Mother   . Hypertension Sister     X 2  . Diabetes Sister   . Hyperlipidemia Sister   . Other Sister     varicose veins  . Heart disease Father   . Diabetes Father   . Hyperlipidemia Father   . Hypertension Father   . Heart attack Father   . Heart disease Brother     MI in late 31s  . Hyperlipidemia Brother   . Heart attack Brother   . Parkinsonism Brother   . Heart failure Brother   . Hypertension Daughter     EXAM: BP 177/60  Pulse 105  Temp(Src) 100.7 F (38.2 C) (Oral)  Resp 16  Ht 5' 4.5" (1.638 m)  Wt 150 lb (68.04 kg)  BMI 25.36 kg/m2  SpO2 95% CONSTITUTIONAL: Alert and oriented and responds appropriately to questions. Well-appearing; well-nourished, no apparent distress, nontoxic HEAD: Normocephalic EYES: Conjunctivae clear, PERRL ENT: normal nose; no rhinorrhea; moist mucous membranes; pharynx without lesions noted NECK: Supple, no meningismus, no LAD  CARD: RRR; S1 and S2 appreciated; no murmurs, no clicks, no rubs, no gallops RESP: Normal chest excursion without splinting or tachypnea; breath sounds clear and equal bilaterally; no wheezes, no rhonchi, no rales,  ABD/GI: Normal bowel sounds; non-distended; soft, non-tender, no rebound, no guarding BACK:  The back appears normal and is non-tender to palpation, there is no CVA tenderness EXT: Normal ROM in all joints; non-tender to palpation; no edema; normal capillary refill; no cyanosis    SKIN: Normal color for age and race; warm NEURO: Moves all extremities equally, no facial droop or slurred speech PSYCH: The patient's mood and manner are appropriate. Grooming and personal hygiene are appropriate.  MEDICAL DECISION MAKING: Patient with possible viral syndrome versus urinary tract infection. She is mildly tachycardic, febrile. However she is otherwise well-appearing, nontoxic, well-hydrated.  We'll obtain labs, urinalysis and culture. Given patient's chest tightness has been constant for 2 days, will obtain EKG, troponin and chest x-ray. Patient is asking for a flu swab. Have explained to patient that given her symptoms started 3 days ago, she is outside treatment window with Tamiflu.  ED PROGRESS: Patient has a leukocytosis of 21.8 with left shift. Chest x-ray shows possible bibasilar opacities versus atelectasis versus edema. History of CHF. Patient does have urinary tract infection. Urine culture pending. Will give ceftriaxone and azithromycin to treat for UTI and possible pneumonia.. Her troponin is negative. Her EKG does show new T wave inversions in inferior and lateral leads compared to prior. Her primary care physician is  Dr. Linna Darner with [facility].  Will discuss with hospitalist for admission.  .    EKG Interpretation     Ventricular Rate:  114 PR Interval:  161 QRS Duration: 94 QT Interval:  278 QTC Calculation: 383 R Axis:   76 Text Interpretation:  Sinus tachycardia Borderline repolarization abnormality             Delice Bison Daya Dutt, DO 09/02/13 2052

## 2013-09-02 NOTE — ED Notes (Signed)
EKG given to EDP,Allen,MD.

## 2013-09-03 DIAGNOSIS — R079 Chest pain, unspecified: Secondary | ICD-10-CM

## 2013-09-03 DIAGNOSIS — I714 Abdominal aortic aneurysm, without rupture: Secondary | ICD-10-CM

## 2013-09-03 DIAGNOSIS — J189 Pneumonia, unspecified organism: Secondary | ICD-10-CM

## 2013-09-03 LAB — CBC
Hemoglobin: 12.7 g/dL (ref 12.0–15.0)
MCH: 29.5 pg (ref 26.0–34.0)
MCHC: 34 g/dL (ref 30.0–36.0)
MCV: 87 fL (ref 78.0–100.0)
RDW: 13.7 % (ref 11.5–15.5)

## 2013-09-03 LAB — GLUCOSE, CAPILLARY
Glucose-Capillary: 166 mg/dL — ABNORMAL HIGH (ref 70–99)
Glucose-Capillary: 130 mg/dL — ABNORMAL HIGH (ref 70–99)
Glucose-Capillary: 195 mg/dL — ABNORMAL HIGH (ref 70–99)

## 2013-09-03 LAB — BASIC METABOLIC PANEL
BUN: 12 mg/dL (ref 6–23)
Creatinine, Ser: 1.03 mg/dL (ref 0.50–1.10)
GFR calc Af Amer: 62 mL/min — ABNORMAL LOW (ref >90)
GFR calc non Af Amer: 54 mL/min — ABNORMAL LOW (ref >90)
Glucose, Bld: 188 mg/dL — ABNORMAL HIGH (ref 70–99)
Potassium: 3.1 mEq/L — ABNORMAL LOW (ref 3.5–5.1)

## 2013-09-03 LAB — INFLUENZA PANEL BY PCR (TYPE A & B)
H1N1 flu by pcr: NOT DETECTED
Influenza A By PCR: NEGATIVE
Influenza B By PCR: NEGATIVE

## 2013-09-03 LAB — TROPONIN I: Troponin I: <0.3 ng/mL (ref <0.30)

## 2013-09-03 MED ORDER — SODIUM CHLORIDE 0.9 % IV BOLUS (SEPSIS)
1000.0000 mL | Freq: Once | INTRAVENOUS | Status: AC
  Administered 2013-09-03: 03:00:00 1000 mL via INTRAVENOUS

## 2013-09-03 NOTE — Progress Notes (Signed)
Negative for flu, droplet d/c, but still on enteric precautions

## 2013-09-03 NOTE — Progress Notes (Signed)
No change from am assessment, except droplet prec d/c

## 2013-09-03 NOTE — Telephone Encounter (Signed)
Tried calling pt's husband back at Saturday clinic. Could not reach. Left a detailed message to have pt be seen either by UC or call our office on Monday morning so we could schedule her. Closing encounter until pt or spouse returns call.

## 2013-09-03 NOTE — Progress Notes (Signed)
[facility] PROGRESS NOTE  Sheila Melton [id number] DOB: [birth year] DOA: 09/02/2013 PCP: Unice Cobble, MD  Assessment/Plan: SIRS -2/2 UTI and PNA. -Still febrile and with significant leukocytosis. -Continue rocephin/azithromycin for now. -Await cx data.  UTI -As above.  CAP -As above.  Chest Pain -Has ruled out for ACS. -Suspect may be related to PNA. -Resolved.  DM -CBGs with fair control. -Continue to monitor and adjust as needed.  HTN -Fair control. -Do not expect any medication changes while in the hospital.  Hyperlipidemia -Continue home meds.  AAA -No abdominal pain. -Continue OP follow up with vascular surgery.  Code Status: Full Code Family Communication: Husband at bedside updated on plan of care.  Disposition Plan: Home when medically stable.   Consultants:  None   Antibiotics:  Rocephin   Azithromycin   Subjective: Feels fatigued.  Objective: Filed Vitals:   09/02/13 2144 09/02/13 2214 09/03/13 0220 09/03/13 0606  BP:  166/62 160/58 154/59  Pulse:  112 111 117  Temp: 99.4 F (37.4 C) 100.6 F (38.1 C) 100.9 F (38.3 C) 100.6 F (38.1 C)  TempSrc:  Oral Oral Oral  Resp:  24 20 20   Height:  5\' 5"  (1.651 m)    Weight:  69.264 kg (152 lb 11.2 oz)    SpO2: 97% 92% 93% 91%    Intake/Output Summary (Last 24 hours) at 09/03/13 1042 Last data filed at 09/03/13 1032  Gross per 24 hour  Intake 2600.42 ml  Output   1775 ml  Net 825.42 ml   Filed Weights   09/02/13 1846 09/02/13 2214  Weight: 68.04 kg (150 lb) 69.264 kg (152 lb 11.2 oz)    Exam:   General:  AA Ox3  Cardiovascular: RRR  Respiratory: CTA B  Abdomen: S/NT/ND/+BS  Extremities: no C/C/E   Neurologic:  Intact, non-focal.  Data Reviewed: Basic Metabolic Panel:  Recent Labs Lab 09/02/13 1952 09/03/13 0535  NA 135 138  K 3.6 3.1*  CL 98 105  CO2 26 22  GLUCOSE 244* 188*  BUN 16 12  CREATININE 1.10 1.03  CALCIUM 10.3 8.7   Liver  Function Tests:  Recent Labs Lab 09/02/13 1952  AST 20  ALT 16  ALKPHOS 93  BILITOT 0.4  PROT 7.3  ALBUMIN 3.6   No results found for this basename: LIPASE, AMYLASE,  in the last 168 hours No results found for this basename: AMMONIA,  in the last 168 hours CBC:  Recent Labs Lab 09/02/13 1952 09/03/13 0535  WBC 21.8* 22.4*  NEUTROABS 17.4*  --   HGB 13.0 12.7  HCT 39.2 37.4  MCV 87.3 87.0  PLT 317 296   Cardiac Enzymes:  Recent Labs Lab 09/02/13 2232 09/03/13 0535  TROPONINI <0.30 <0.30   BNP (last 3 results) No results found for this basename: PROBNP,  in the last 8760 hours CBG:  Recent Labs Lab 09/02/13 2210 09/03/13 0741  GLUCAP 225* 166*    Recent Results (from the past 240 hour(s))  URINE CULTURE     Status: None   Collection Time    08/26/13  4:47 PM      Result Value Range Status   Culture     Final   Value: ENTEROCOCCUS SPECIES     ESCHERICHIA COLI   Colony Count >=100,000 COLONIES/ML   Final   Organism ID, Bacteria ENTEROCOCCUS SPECIES   Final   Organism ID, Bacteria ESCHERICHIA COLI   Final     Studies: Dg Chest 2 View  09/02/2013  CLINICAL DATA:  Fever, chest pain.  EXAM: CHEST  2 VIEW  COMPARISON:  July 22, 2012.  FINDINGS: The heart size and mediastinal contours are within normal limits. There is the interval development of interstitial densities involving both lung bases, left worse than right. The visualized skeletal structures are unremarkable.  IMPRESSION: Interval development of bilateral basilar interstitial densities, with left worse than right. This may represent edema or pneumonia.   Electronically Signed   By: Sabino Dick M.D.   On: 09/02/2013 19:37    Scheduled Meds: . amLODipine  5 mg Oral BID  . azithromycin  500 mg Intravenous Q24H  . cefTRIAXone (ROCEPHIN)  IV  1 g Intravenous Q24H  . cholecalciferol  2,000 Units Oral Daily  . diazepam  5 mg Oral QHS  . enoxaparin (LOVENOX) injection  40 mg Subcutaneous QHS  .  fenofibrate  160 mg Oral Daily  . insulin aspart  0-9 Units Subcutaneous TID WC  . irbesartan  300 mg Oral Daily  . labetalol  300 mg Oral BID  . loratadine  10 mg Oral Daily  . pravastatin  40 mg Oral q1800  . sodium chloride  1,000 mL Intravenous Once  . sodium chloride  3 mL Intravenous Q12H   Continuous Infusions: . sodium chloride 125 mL/hr at 09/03/13 [id number]    Principal Problem:   SIRS (systemic inflammatory response syndrome) Active Problems:   Unspecified essential hypertension   DIABETES MELLITUS, UNCONTROLLED   UTI (lower urinary tract infection)   CAP (community acquired pneumonia)    Time spent: 35 minutes.    Sheila Melton  [facility] Pager [phone]  If 7PM-7AM, please contact night-coverage at [email or web address], password [facility] 09/03/2013, 10:42 AM  LOS: 1 day

## 2013-09-04 DIAGNOSIS — J189 Pneumonia, unspecified organism: Secondary | ICD-10-CM

## 2013-09-04 DIAGNOSIS — E876 Hypokalemia: Secondary | ICD-10-CM

## 2013-09-04 LAB — BASIC METABOLIC PANEL
CO2: 20 mEq/L (ref 19–32)
Chloride: 110 mEq/L (ref 96–112)
Creatinine, Ser: 0.99 mg/dL (ref 0.50–1.10)
GFR calc Af Amer: 65 mL/min — ABNORMAL LOW (ref >90)
Glucose, Bld: 145 mg/dL — ABNORMAL HIGH (ref 70–99)
Potassium: 2.9 mEq/L — ABNORMAL LOW (ref 3.5–5.1)
Sodium: 141 mEq/L (ref 135–145)

## 2013-09-04 LAB — CBC
HCT: 36.1 % (ref 36.0–46.0)
Hemoglobin: 12 g/dL (ref 12.0–15.0)
MCV: 86.6 fL (ref 78.0–100.0)
RBC: 4.17 MIL/uL (ref 3.87–5.11)
WBC: 12.9 10*3/uL — ABNORMAL HIGH (ref 4.0–10.5)

## 2013-09-04 LAB — URINE CULTURE
Colony Count: NO GROWTH
Culture: NO GROWTH

## 2013-09-04 LAB — GLUCOSE, CAPILLARY
Glucose-Capillary: 166 mg/dL — ABNORMAL HIGH (ref 70–99)
Glucose-Capillary: 144 mg/dL — ABNORMAL HIGH (ref 70–99)
Glucose-Capillary: 147 mg/dL — ABNORMAL HIGH (ref 70–99)
Glucose-Capillary: 190 mg/dL — ABNORMAL HIGH (ref 70–99)

## 2013-09-04 LAB — LEGIONELLA ANTIGEN, URINE

## 2013-09-04 LAB — MAGNESIUM: Magnesium: 1.8 mg/dL (ref 1.5–2.5)

## 2013-09-04 MED ORDER — LEVOFLOXACIN 750 MG PO TABS
750.0000 mg | ORAL_TABLET | Freq: Every day | ORAL | Status: DC
  Administered 2013-09-04 – 2013-09-05 (×2): 750 mg via ORAL
  Filled 2013-09-04 (×2): qty 1

## 2013-09-04 MED ORDER — POTASSIUM CHLORIDE CRYS ER 20 MEQ PO TBCR
40.0000 meq | EXTENDED_RELEASE_TABLET | ORAL | Status: AC
  Administered 2013-09-04 (×2): 40 meq via ORAL
  Filled 2013-09-04 (×2): qty 2

## 2013-09-04 NOTE — Progress Notes (Addendum)
[facility] PROGRESS NOTE  Sheila Melton [id number] DOB: 1941-01-10 DOA: 09/02/2013 PCP: Unice Cobble, MD  Assessment/Plan: SIRS -2/2 UTI and PNA. -Leukocytosis markedly improved. -Had a temp of 100.8 overnight. -Change abx to Levaquin. -Await cx data.  UTI -Urine cx is negative.  CAP -As above.  Chest Pain -Has ruled out for ACS. -Suspect may be related to PNA. -Resolved.  DM -CBGs with fair control. -Continue to monitor and adjust as needed.  HTN -Fair control. -Do not expect any medication changes while in the hospital.  Hyperlipidemia -Continue home meds.  AAA -No abdominal pain. -Continue OP follow up with vascular surgery.  Hypokalemia -Replete PO. -Check Mag.  Rash on Palms of Hand -Unsure of cause. -As a precaution, will switch antibiotics and have asked her to stop using the lotion in the hospital.  Code Status: Full Code Family Communication: Husband at bedside updated on plan of care.  Disposition Plan: Home when medically stable. Likely 24-48 hours.   Consultants:  None   Antibiotics:  Rocephin   Azithromycin   Subjective: Feels fatigued.  Objective: Filed Vitals:   09/03/13 1424 09/03/13 2054 09/04/13 0509 09/04/13 1014  BP: 148/52 157/65 133/51 138/68  Pulse: 111 102 50 95  Temp: 99.9 F (37.7 C) 100.8 F (38.2 C) 97.9 F (36.6 C)   TempSrc: Oral Oral Oral   Resp: 20 20 18    Height:      Weight:      SpO2: 93% 93% 91%     Intake/Output Summary (Last 24 hours) at 09/04/13 1038 Last data filed at 09/04/13 0932  Gross per 24 hour  Intake 4092.91 ml  Output   5500 ml  Net -1407.09 ml   Filed Weights   09/02/13 1846 09/02/13 2214  Weight: 68.04 kg (150 lb) 69.264 kg (152 lb 11.2 oz)    Exam:   General:  AA Ox3  Cardiovascular: RRR  Respiratory: CTA B  Abdomen: S/NT/ND/+BS  Extremities: no C/C/E   Neurologic:  Intact, non-focal.  Data Reviewed: Basic Metabolic Panel:  Recent Labs Lab  09/02/13 1952 09/03/13 0535 09/04/13 0500  NA 135 138 141  K 3.6 3.1* 2.9*  CL 98 105 110  CO2 26 22 20   GLUCOSE 244* 188* 145*  BUN 16 12 10   CREATININE 1.10 1.03 0.99  CALCIUM 10.3 8.7 9.1  MG  --   --  1.8   Liver Function Tests:  Recent Labs Lab 09/02/13 1952  AST 20  ALT 16  ALKPHOS 93  BILITOT 0.4  PROT 7.3  ALBUMIN 3.6   No results found for this basename: LIPASE, AMYLASE,  in the last 168 hours No results found for this basename: AMMONIA,  in the last 168 hours CBC:  Recent Labs Lab 09/02/13 1952 09/03/13 0535 09/04/13 0500  WBC 21.8* 22.4* 12.9*  NEUTROABS 17.4*  --   --   HGB 13.0 12.7 12.0  HCT 39.2 37.4 36.1  MCV 87.3 87.0 86.6  PLT 317 296 294   Cardiac Enzymes:  Recent Labs Lab 09/02/13 2232 09/03/13 0535 09/03/13 1131  TROPONINI <0.30 <0.30 <0.30   BNP (last 3 results) No results found for this basename: PROBNP,  in the last 8760 hours CBG:  Recent Labs Lab 09/03/13 0741 09/03/13 1230 09/03/13 1708 09/03/13 2047 09/04/13 0800  GLUCAP 166* 130* 169* 195* 166*    Recent Results (from the past 240 hour(s))  URINE CULTURE     Status: None   Collection Time    08/26/13  4:47 PM      Result Value Range Status   Culture     Final   Value: ENTEROCOCCUS SPECIES     ESCHERICHIA COLI   Colony Count >=100,000 COLONIES/ML   Final   Organism ID, Bacteria ENTEROCOCCUS SPECIES   Final   Organism ID, Bacteria ESCHERICHIA COLI   Final  URINE CULTURE     Status: None   Collection Time    09/02/13  7:21 PM      Result Value Range Status   Specimen Description URINE, CLEAN CATCH   Final   Special Requests NONE   Final   Culture  Setup Time     Final   Value: 09/03/2013 03:28     Performed at [place]     Final   Value: NO GROWTH     Performed at [facility]   Culture     Final   Value: NO GROWTH     Performed at [facility]   Report Status 09/04/2013 FINAL   Final  CULTURE, BLOOD  (ROUTINE X 2)     Status: None   Collection Time    09/02/13  8:33 PM      Result Value Range Status   Specimen Description BLOOD RIGHT ANTECUBITAL   Final   Special Requests NONE BOTTLES DRAWN AEROBIC AND ANAEROBIC 4CC   Final   Culture  Setup Time     Final   Value: 09/03/2013 01:13     Performed at [facility]   Culture     Final   Value:        BLOOD CULTURE RECEIVED NO GROWTH TO DATE CULTURE WILL BE HELD FOR 5 DAYS BEFORE ISSUING A FINAL NEGATIVE REPORT     Performed at [facility]   Report Status PENDING   Incomplete  CULTURE, BLOOD (ROUTINE X 2)     Status: None   Collection Time    09/02/13  8:40 PM      Result Value Range Status   Specimen Description BLOOD  R ARM   Final   Special Requests NONE BOTTLES DRAWN AEROBIC AND ANAEROBIC 4CC   Final   Culture  Setup Time     Final   Value: 09/03/2013 01:14     Performed at [facility]   Culture     Final   Value:        BLOOD CULTURE RECEIVED NO GROWTH TO DATE CULTURE WILL BE HELD FOR 5 DAYS BEFORE ISSUING A FINAL NEGATIVE REPORT     Performed at [facility]   Report Status PENDING   Incomplete     Studies: Dg Chest 2 View  09/02/2013   CLINICAL DATA:  Fever, chest pain.  EXAM: CHEST  2 VIEW  COMPARISON:  July 22, 2012.  FINDINGS: The heart size and mediastinal contours are within normal limits. There is the interval development of interstitial densities involving both lung bases, left worse than right. The visualized skeletal structures are unremarkable.  IMPRESSION: Interval development of bilateral basilar interstitial densities, with left worse than right. This may represent edema or pneumonia.   Electronically Signed   By: Sabino Dick M.D.   On: 09/02/2013 19:37    Scheduled Meds: . amLODipine  5 mg Oral BID  . azithromycin  500 mg Intravenous Q24H  . cefTRIAXone (ROCEPHIN)  IV  1 g Intravenous Q24H  . cholecalciferol  2,000 Units Oral Daily  .  diazepam  5 mg Oral QHS  .  enoxaparin (LOVENOX) injection  40 mg Subcutaneous QHS  . fenofibrate  160 mg Oral Daily  . insulin aspart  0-9 Units Subcutaneous TID WC  . irbesartan  300 mg Oral Daily  . labetalol  300 mg Oral BID  . loratadine  10 mg Oral Daily  . potassium chloride  40 mEq Oral Q4H  . pravastatin  40 mg Oral q1800  . sodium chloride  1,000 mL Intravenous Once  . sodium chloride  3 mL Intravenous Q12H   Continuous Infusions:    Principal Problem:   SIRS (systemic inflammatory response syndrome) Active Problems:   Unspecified essential hypertension   DIABETES MELLITUS, UNCONTROLLED   UTI (lower urinary tract infection)   CAP (community acquired pneumonia)   Hypokalemia    Time spent: 35 minutes.    [name]  [facility] Pager [phone]  If 7PM-7AM, please contact night-coverage at [email or web address], password [facility] 09/04/2013, 10:38 AM  LOS: 2 days

## 2013-09-05 LAB — BASIC METABOLIC PANEL
BUN: 14 mg/dL (ref 6–23)
CO2: 20 mEq/L (ref 19–32)
Calcium: 9.9 mg/dL (ref 8.4–10.5)
Creatinine, Ser: 1.03 mg/dL (ref 0.50–1.10)
GFR calc non Af Amer: 54 mL/min — ABNORMAL LOW (ref >90)
Sodium: 137 mEq/L (ref 135–145)

## 2013-09-05 LAB — CBC
MCH: 29.5 pg (ref 26.0–34.0)
MCHC: 34.2 g/dL (ref 30.0–36.0)
Platelets: 314 10*3/uL (ref 150–400)
WBC: 11.3 10*3/uL — ABNORMAL HIGH (ref 4.0–10.5)

## 2013-09-05 MED ORDER — LEVOFLOXACIN 750 MG PO TABS: 750.0000 mg | ORAL_TABLET | Freq: Every day | ORAL | Status: DC

## 2013-09-05 NOTE — Discharge Summary (Signed)
Physician Discharge Summary  Sheila Melton [id number] DOB: 03-29-1941 DOA: 09/02/2013  PCP: Unice Cobble, MD  Admit date: 09/02/2013 Discharge date: 09/05/2013  Time spent: 45 minutes  Recommendations for Outpatient Follow-up:  -Will be discharged home today. -Has 7 days of levaquin remaining on DC. -Advised to follow up with PCP in 2 weeks. -Would recommend repeat CXR in 4-6 weeks to ensure complete resolution of her PNA.   Discharge Diagnoses:  Principal Problem:   SIRS (systemic inflammatory response syndrome) Active Problems:   Unspecified essential hypertension   DIABETES MELLITUS, UNCONTROLLED   UTI (lower urinary tract infection)   CAP (community acquired pneumonia)   Hypokalemia   Discharge Condition: Stable and Improved  Filed Weights   09/02/13 1846 09/02/13 2214  Weight: 68.04 kg (150 lb) 69.264 kg (152 lb 11.2 oz)    History of present illness:  Sheila Melton is a 72 y.o. female history of diabetes mellitus presently on no medications, chronic kidney disease, hypertension and hyperlipidemia presented to the ER because of fever chills and weakness with nausea. Patient was having increased frequency of urination last week and had gone to her PCP who had placed patient on Macrobid after patient was found to have UA consistent with UTI. Patient has been taking Macrobid for last 7 days. Yesterday patient started experiencing fever and chills and today morning patient got weak and myalgia. Patient started having some chest tightness but denies any shortness of breath. Patient had some nausea but denies any vomiting and had one episode diarrhea earlier in the morning. Due to her symptoms patient came to the ER and was found to be febrile with UA consistent with UTI and chest x-ray showing possible infiltrates. Patient has been placed on antibiotics after blood cultures and urine cultures were obtained. We were asked to admit her for further evaluation and  management.   Hospital Course:   SIRS  -2/2 UTI and PNA.  -Leukocytosis markedly improved.  -Afebrile for 36 hours. -All cx data has remained negative to date. -Levaquin for 7 more days on DC.  UTI  -Urine cx is negative.   CAP  -As above.   Chest Pain  -Has ruled out for ACS.  -Suspect may be related to PNA.  -Resolved.   DM  -CBGs with fair control.  -Continue to monitor and adjust as needed.   HTN  -Fair control.  -Continue home medications.  Hyperlipidemia  -Continue home meds.   AAA  -No abdominal pain.  -Continue OP follow up with vascular surgery.   Hypokalemia  -Repleted. -Mg 1.8.  Rash on Palms of Hand  -Unsure of cause.  -She has an allergy to amoxicillin and was on rocephin..?cross reaction. -Is somewhat improving today.   Procedures:  None   Consultations:  None  Discharge Instructions  Discharge Orders   Future Appointments Provider Department Dept Phone   06/13/2014 8:30 AM [facility] [place] [phone]   Eat a light meal the night before the exam but please avoid gaseous foods.   Nothing to eat or drink for at least 8 hours prior to the exam. No gum chewing or smoking the morning of the exam. Please take your morning medications with small sips of water, especially blood pressure medication. If you have several vascular lab exams and will see physician, please bring a snack with you.   06/13/2014 9:00 AM Rosetta Posner, MD [facility] [phone]   Future Orders Complete By Expires  Diet - low sodium heart healthy  As directed    Discontinue IV  As directed    Increase activity slowly  As directed        Medication List    STOP taking these medications       furosemide 40 MG tablet  Commonly known as:  LASIX     nitrofurantoin (macrocrystal-monohydrate) 100 MG capsule  Commonly known as:  MACROBID      TAKE these medications       acetaminophen 500 MG tablet   Commonly known as:  TYLENOL  Take 500 mg by mouth every 6 (six) hours as needed for pain.     amLODipine 5 MG tablet  Commonly known as:  NORVASC  Take 1 tablet (5 mg total) by mouth 2 (two) times daily.     diazepam 5 MG tablet  Commonly known as:  VALIUM  Take 5 mg by mouth at bedtime.     fenofibrate micronized 134 MG capsule  Commonly known as:  LOFIBRA  Take 134 mg by mouth daily before breakfast.     fexofenadine 180 MG tablet  Commonly known as:  ALLEGRA  Take 180 mg by mouth at bedtime.     labetalol 300 MG tablet  Commonly known as:  NORMODYNE  Take 300 mg by mouth 2 (two) times daily.     levofloxacin 750 MG tablet  Commonly known as:  LEVAQUIN  Take 1 tablet (750 mg total) by mouth daily.     pravastatin 40 MG tablet  Commonly known as:  PRAVACHOL  1 by mouth daily     SUPER B COMPLEX PO  Take 1 tablet by mouth daily.     valsartan 320 MG tablet  Commonly known as:  DIOVAN  Take 1 tablet (320 mg total) by mouth at bedtime.     Vitamin D3 2000 UNITS Tabs  Take 2,000 Units by mouth daily.     vitamin E 400 UNIT capsule  Take 400 Units by mouth daily.       Allergies  Allergen Reactions  . Amoxicillin     REACTION: rash  . Captopril     REACTION: cough  . Simvastatin     REACTION: buttocks cramps  . Adhesive [Tape] Rash       Follow-up Information   Follow up with Unice Cobble, MD. Schedule an appointment as soon as possible for a visit in 2 weeks.   Specialty:  Internal Medicine   Contact information:   [phone] [address] [address] [place] [address] [phone]        The results of significant diagnostics from this hospitalization (including imaging, microbiology, ancillary and laboratory) are listed below for reference.    Significant Diagnostic Studies: Dg Chest 2 View  09/02/2013   CLINICAL DATA:  Fever, chest pain.  EXAM: CHEST  2 VIEW  COMPARISON:  July 22, 2012.  FINDINGS: The heart size and  mediastinal contours are within normal limits. There is the interval development of interstitial densities involving both lung bases, left worse than right. The visualized skeletal structures are unremarkable.  IMPRESSION: Interval development of bilateral basilar interstitial densities, with left worse than right. This may represent edema or pneumonia.   Electronically Signed   By: Sabino Dick M.D.   On: 09/02/2013 19:37    Microbiology: Recent Results (from the past 240 hour(s))  URINE CULTURE     Status: None   Collection Time    08/26/13  4:47 PM      Result Value Range Status   Culture     Final   Value: ENTEROCOCCUS SPECIES     ESCHERICHIA COLI   Colony Count >=100,000 COLONIES/ML   Final   Organism ID, Bacteria ENTEROCOCCUS SPECIES   Final   Organism ID, Bacteria ESCHERICHIA COLI   Final  URINE CULTURE     Status: None   Collection Time    09/02/13  7:21 PM      Result Value Range Status   Specimen Description URINE, CLEAN CATCH   Final   Special Requests NONE   Final   Culture  Setup Time     Final   Value: 09/03/2013 03:28     Performed at [place]     Final   Value: NO GROWTH     Performed at [facility]   Culture     Final   Value: NO GROWTH     Performed at [facility]   Report Status 09/04/2013 FINAL   Final  CULTURE, BLOOD (ROUTINE X 2)     Status: None   Collection Time    09/02/13  8:33 PM      Result Value Range Status   Specimen Description BLOOD RIGHT ANTECUBITAL   Final   Special Requests NONE BOTTLES DRAWN AEROBIC AND ANAEROBIC 4CC   Final   Culture  Setup Time     Final   Value: 09/03/2013 01:13     Performed at [facility]   Culture     Final   Value:        BLOOD CULTURE RECEIVED NO GROWTH TO DATE CULTURE WILL BE HELD FOR 5 DAYS BEFORE ISSUING A FINAL NEGATIVE REPORT     Performed at [facility]   Report Status PENDING   Incomplete  CULTURE, BLOOD (ROUTINE X 2)     Status: None    Collection Time    09/02/13  8:40 PM      Result Value Range Status   Specimen Description BLOOD  R ARM   Final   Special Requests NONE BOTTLES DRAWN AEROBIC AND ANAEROBIC 4CC   Final   Culture  Setup Time     Final   Value: 09/03/2013 01:14     Performed at [facility]   Culture     Final   Value:        BLOOD CULTURE RECEIVED NO GROWTH TO DATE CULTURE WILL BE HELD FOR 5 DAYS BEFORE ISSUING A FINAL NEGATIVE REPORT     Performed at [facility]   Report Status PENDING   Incomplete     Labs: Basic Metabolic Panel:  Recent Labs Lab 09/02/13 1952 09/03/13 0535 09/04/13 0500 09/05/13 0525  NA 135 138 141 137  K 3.6 3.1* 2.9* 3.6  CL 98 105 110 105  CO2 26 22 20 20   GLUCOSE 244* 188* 145* 179*  BUN 16 12 10 14   CREATININE 1.10 1.03 0.99 1.03  CALCIUM 10.3 8.7 9.1 9.9  MG  --   --  1.8  --    Liver Function Tests:  Recent Labs Lab 09/02/13 1952  AST 20  ALT 16  ALKPHOS 93  BILITOT 0.4  PROT 7.3  ALBUMIN 3.6   No results found for this basename: LIPASE, AMYLASE,  in the last 168 hours No results found for this basename: AMMONIA,  in the last 168 hours CBC:  Recent  Labs Lab 09/02/13 1952 09/03/13 0535 09/04/13 0500 09/05/13 0525  WBC 21.8* 22.4* 12.9* 11.3*  NEUTROABS 17.4*  --   --   --   HGB 13.0 12.7 12.0 12.4  HCT 39.2 37.4 36.1 36.3  MCV 87.3 87.0 86.6 86.2  PLT 317 296 294 314   Cardiac Enzymes:  Recent Labs Lab 09/02/13 2232 09/03/13 0535 09/03/13 1131  TROPONINI <0.30 <0.30 <0.30   BNP: BNP (last 3 results) No results found for this basename: PROBNP,  in the last 8760 hours CBG:  Recent Labs Lab 09/04/13 0800 09/04/13 1149 09/04/13 1638 09/04/13 2154 09/05/13 0703  GLUCAP 166* 144* 147* 190* 158*       Signed:  HERNANDEZ ACOSTA,ESTELA  [facility] Pager: [phone] 09/05/2013, 9:51 AM

## 2013-09-05 NOTE — Care Management Note (Signed)
    Page 1 of 1   09/05/2013     12:26:13 PM   CARE MANAGEMENT NOTE 09/05/2013  Patient:  Sheila Melton, Sheila Melton   Account Number:  [id number]  Date Initiated:  09/03/2013  Documentation initiated by:  [facility]  Subjective/Objective Assessment:   72 year old female admitted with SIRS probably from UTI.     Action/Plan:   From home.   Anticipated DC Date:  09/05/2013   Anticipated DC Plan:  [place]  CM consult      Choice offered to / List presented to:             Status of service:  Completed, signed off Medicare Important Message given?  NA - LOS <3 / Initial given by admissions (If response is "NO", the following Medicare IM given date fields will be blank) Date Medicare IM given:   Date Additional Medicare IM given:    Discharge Disposition:  HOME/SELF CARE  Per UR Regulation:  Reviewed for med. necessity/level of care/duration of stay  If discussed at [place] of Stay Meetings, dates discussed:    Comments:  09/05/13 Eylin Pontarelli RN,BSN NCM [phone] D/C HOME NO [place].

## 2013-09-09 LAB — CULTURE, BLOOD (ROUTINE X 2): Culture: NO GROWTH

## 2013-09-16 ENCOUNTER — Ambulatory Visit (INDEPENDENT_AMBULATORY_CARE_PROVIDER_SITE_OTHER): Payer: Medicare Other | Admitting: Internal Medicine

## 2013-09-16 ENCOUNTER — Encounter: Payer: Self-pay | Admitting: Internal Medicine

## 2013-09-16 VITALS — BP 147/66 | HR 82 | Temp 98.4°F | Wt 153.8 lb

## 2013-09-16 DIAGNOSIS — F17200 Nicotine dependence, unspecified, uncomplicated: Secondary | ICD-10-CM

## 2013-09-16 DIAGNOSIS — J189 Pneumonia, unspecified organism: Secondary | ICD-10-CM

## 2013-09-16 DIAGNOSIS — N058 Unspecified nephritic syndrome with other morphologic changes: Secondary | ICD-10-CM

## 2013-09-16 DIAGNOSIS — E1329 Other specified diabetes mellitus with other diabetic kidney complication: Secondary | ICD-10-CM

## 2013-09-16 LAB — BASIC METABOLIC PANEL
Calcium: 9.7 mg/dL (ref 8.4–10.5)
Chloride: 102 mEq/L (ref 96–112)
Glucose, Bld: 152 mg/dL — ABNORMAL HIGH (ref 70–99)
Sodium: 137 mEq/L (ref 135–145)

## 2013-09-16 NOTE — Assessment & Plan Note (Signed)
Order for x-rays entered into  the computer BMET

## 2013-09-16 NOTE — Progress Notes (Signed)
Subjective:    Patient ID: Sheila Melton, female    DOB: [birth year], 72 y.o.   MRN: [id number]  HPI   Her complicated course 0000000 was reviewed. She is in the hospital on nitrofurantoin for Escherichia coli and enterococcal urinary tract infection to which the bacteria were sensitive. She presented with fever and chills the absence of cough or sputum production. Chest x-ray did show bilateral lower lung field infiltrates, left greater than right. I personally reviewed those x-rays. They suggest course interstitial markings rather than pneumonia. Hospital course was complicated by SIRS and profound hypokalemia. Her white count had been as high as 22,400. Followup urine culture 10/24 was negative as were blood cultures.  Glucoses were poorly controlled in the range of 145-244. She also had a significant rash and blistering of the palms from Rocephin. This is in the context of known allergy to ampicillin. She was discharged on Levaquin.  She wanted to discuss her Valium prescription and the need for drug screening. I described the risk as documented by Dr Tomasa Blase studies and encouraged this to be taken as little as possible because of the significant risk at her age.  We also discussed the risk related to her continuing smoking. She has begun to use an E cigarette and states that her tobacco consumption is decreased by half.    Review of Systems   She denies fever, chills, or sweats. She's having no cough or sputum production. She also denies dysuria, pyuria, or hematuria. No diarrhea reported.  Fasting blood sugars now range 130-140. Last night glucose 2 hours after meal was 180. She is on low carb & no sweets diet.     Objective:   Physical Exam Gen.: adequately nourished in appearance. Alert, appropriate and cooperative throughout exam.Appears younger than stated age    Eyes: No corneal or conjunctival inflammation noted. No icterus. Nose: External nasal exam reveals no deformity or  inflammation. Nasal mucosa are pink and moist. No lesions or exudates noted.  Mouth: Oral mucosa and oropharynx reveal no lesions or exudates. Teeth in good repair. Neck: No deformities, masses, or tenderness noted.  Lungs: Normal respiratory effort; chest expands symmetrically. Lungs are clear to auscultation without rales, wheezes, or increased work of breathing. BS decreased Heart: Normal rate and rhythm. Normal S1 and S2. No gallop, click, or rub. Grade 1/6 systolic murmur. Abdomen: Bowel sounds normal; abdomen soft and nontender. No masses, organomegaly or hernias noted.                                 Musculoskeletal/extremities: No clubbing, cyanosis, edema, or significant extremity  deformity noted. Tone & strength normal. Hand joints normal OR reveal mild  DJD DIP changes. Fingernail / toenail health good. Able to lie down & sit up w/o help. Negative SLR bilaterally Vascular: Carotid, radial artery, dorsalis pedis and  posterior tibial pulses are full and equal. No bruits present. Neurologic: Alert and oriented x3.        Skin: Intact without suspicious lesions or rashes. Lymph: No cervical, axillary lymphadenopathy present. Psych: Mood and affect are normal. Normally interactive  Assessment & Plan:  See Current Assessment & Plan in Problem List under specific Diagnosis

## 2013-09-16 NOTE — Patient Instructions (Addendum)
To prevent sleep dysfunction follow these instructions for sleep hygiene. Do not read, watch TV, or eat in bed. Do not get into bed until you are ready to turn off the light &  to go to sleep. Do not ingest stimulants ( decongestants, diet pills, nicotine, caffeine) after the evening meal.Do not take daytime naps.Cardiovascular exercise, this can be as simple a program as walking, is recommended 30-45 minutes 3-4 times per week. If you're not exercising you should take 6-8 weeks to build up to this level. Your next office appointment will be determined based upon review of your pending labs & x-rays. Those instructions will be transmitted to you through My Chart.  Followup as needed for your this acute issue. Please report any significant change in your symptoms.

## 2013-09-16 NOTE — Progress Notes (Signed)
Pre-visit discussion using our clinic review tool, if applicable.  No additional management support is needed unless otherwise documented below in the visit note.

## 2013-09-16 NOTE — Assessment & Plan Note (Addendum)
Please think about quitting smoking. Review the risks we discussed. Please call 1-800-QUIT-NOW 7697698234) for free smoking cessation counseling. There are multiple options are to help you stop smoking. These include nicotine patches &  nicotine gum in addition to the  new "E cigarette".

## 2013-09-16 NOTE — Assessment & Plan Note (Addendum)
A1c & urine microalbumin in 3 months after nutritional & exercise interventions as discussed. To help prevent infections in the future ,paramount is Diabetes control.This was discussed

## 2013-09-17 ENCOUNTER — Other Ambulatory Visit: Payer: Self-pay | Admitting: Internal Medicine

## 2013-09-17 DIAGNOSIS — N289 Disorder of kidney and ureter, unspecified: Secondary | ICD-10-CM

## 2013-09-19 ENCOUNTER — Ambulatory Visit (INDEPENDENT_AMBULATORY_CARE_PROVIDER_SITE_OTHER)
Admission: RE | Admit: 2013-09-19 | Discharge: 2013-09-19 | Disposition: A | Discharge status: Home / Self Care | Payer: Medicare Other | Source: Ambulatory Visit | Attending: Internal Medicine | Admitting: Internal Medicine

## 2013-09-19 DIAGNOSIS — J189 Pneumonia, unspecified organism: Secondary | ICD-10-CM

## 2013-09-20 ENCOUNTER — Encounter: Payer: Self-pay | Admitting: Internal Medicine

## 2013-10-12 ENCOUNTER — Other Ambulatory Visit: Payer: Self-pay | Admitting: *Deleted

## 2013-10-12 ENCOUNTER — Telehealth: Payer: Self-pay | Admitting: *Deleted

## 2013-10-12 MED ORDER — DIAZEPAM 5 MG PO TABS: 5.0000 mg | ORAL_TABLET | Freq: Every day | ORAL | Status: DC

## 2013-10-12 NOTE — Telephone Encounter (Signed)
Patient called and requested refill on Valium.  Last seen-09/16/2013  Last filled-08/02/2013  UDS-04/13/2013 low risk, contract signed  Please advise. SW

## 2013-10-12 NOTE — Telephone Encounter (Signed)
OK 

## 2013-10-19 ENCOUNTER — Other Ambulatory Visit: Payer: Self-pay | Admitting: *Deleted

## 2013-10-19 DIAGNOSIS — E785 Hyperlipidemia, unspecified: Secondary | ICD-10-CM

## 2013-10-19 MED ORDER — PRAVASTATIN SODIUM 40 MG PO TABS: ORAL_TABLET | ORAL | Status: DC

## 2013-11-14 ENCOUNTER — Other Ambulatory Visit: Payer: Self-pay | Admitting: Internal Medicine

## 2013-11-15 NOTE — Telephone Encounter (Signed)
Script faxed to pharmacy. JG//CMA 

## 2013-11-15 NOTE — Telephone Encounter (Signed)
OK X1 

## 2013-11-15 NOTE — Telephone Encounter (Signed)
diazepam (VALIUM) 5 MG tablet Last refill: 10/12/13 #30, 0 refills Last OV: 09/16/13 UDS up-to-date, low risk

## 2013-11-16 NOTE — Telephone Encounter (Signed)
Med filled.  

## 2013-11-22 ENCOUNTER — Ambulatory Visit (INDEPENDENT_AMBULATORY_CARE_PROVIDER_SITE_OTHER): Payer: Medicare Other | Admitting: Internal Medicine

## 2013-11-22 ENCOUNTER — Encounter: Payer: Self-pay | Admitting: Internal Medicine

## 2013-11-22 VITALS — BP 152/63 | HR 80 | Temp 98.5°F | Wt 156.2 lb

## 2013-11-22 DIAGNOSIS — J019 Acute sinusitis, unspecified: Secondary | ICD-10-CM

## 2013-11-22 MED ORDER — SULFAMETHOXAZOLE-TMP DS 800-160 MG PO TABS: 1.0000 | ORAL_TABLET | Freq: Two times a day (BID) | ORAL | Status: DC

## 2013-11-22 NOTE — Progress Notes (Signed)
Pre visit review using our clinic review tool, if applicable. No additional management support is needed unless otherwise documented below in the visit note. 

## 2013-11-22 NOTE — Progress Notes (Signed)
   Subjective:    Patient ID: Sheila Melton, female    DOB: July 08, 1941, 73 y.o.   MRN: [id number]  HPI 10 day course of cough with yellow nasal exudate. Reports fever and pain in L ear. Denies ear discharge. Denies myalgias, dyspnea, or chest pain. Smoking e-cigarette 1.2 nicotine. Has used Mucinex DM, Azelastine nasal spray, Tylenol for last 10 days.    Review of Systems   The cough is not associated with shortness breath or wheezing She is not having frontal headache, facial pain, otic discharge, sneezing, itchy eyes, watery eyes.     Objective:   Physical Exam General appearance:good health ;well nourished; no acute distress or increased work of breathing is present.  No  lymphadenopathy about the head, neck, or axilla noted.   Eyes: No conjunctival inflammation or lid edema is present.   Ears:  External ear exam shows no significant lesions or deformities.  Otoscopic examination reveals wax bilaterally without bulging, retraction, inflammation or discharge. Dark cerumen noted in both canals.  Nose:  External nasal examination shows no deformity or inflammation. Nasal mucosa are pink and moist without lesions or exudates. No septal dislocation or deviation.No obstruction to airflow.   Oral exam: Dental hygiene is good; lips and gums are healthy appearing.There is no oropharyngeal erythema or exudate noted.   Neck:  No deformities,  masses, or tenderness noted.   Supple with full range of motion without pain.   Heart:  Normal rate and regular rhythm. S1 and S2 normal without gallop, click, rub. Slight murmur L base     Lungs:Chest clear to auscultation; no wheezes, rhonchi or rubs present. Mild rales noted bilaterally.  No increased work of breathing.    Extremities:  No cyanosis, edema, or clubbing  noted    Skin: Warm & dry          Assessment & Plan:  #1 rhinosinusitis without significant bronchitis  Plan: Nasal hygiene interventions discussed. See prescription  medications

## 2013-11-22 NOTE — Patient Instructions (Signed)
Plain Mucinex (NOT D) for thick secretions ;force NON dairy fluids .   Nasal cleansing in the shower as discussed with lather of mild shampoo.After 10 seconds wash off lather while  exhaling through nostrils. Make sure that all residual soap is removed to prevent irritation.  Nasacort AQ 1 spray in each nostril twice a day as needed. Use the "crossover" technique into opposite nostril spraying toward opposite ear @ 45 degree angle, not straight up into nostril.  Use a Neti pot daily only  as needed for significant sinus congestion; going from open side to congested side . Plain Allegra (NOT D )  160 daily , Loratidine 10 mg , OR Zyrtec 10 mg @ bedtime  as needed for itchy eyes & sneezing.    

## 2013-11-23 ENCOUNTER — Telehealth: Payer: Self-pay | Admitting: Internal Medicine

## 2013-11-23 ENCOUNTER — Other Ambulatory Visit: Payer: Self-pay | Admitting: *Deleted

## 2013-11-23 DIAGNOSIS — E1365 Other specified diabetes mellitus with hyperglycemia: Principal | ICD-10-CM

## 2013-11-23 DIAGNOSIS — E1329 Other specified diabetes mellitus with other diabetic kidney complication: Secondary | ICD-10-CM

## 2013-11-23 DIAGNOSIS — R799 Abnormal finding of blood chemistry, unspecified: Secondary | ICD-10-CM

## 2013-11-23 DIAGNOSIS — R7989 Other specified abnormal findings of blood chemistry: Secondary | ICD-10-CM

## 2013-11-23 NOTE — Telephone Encounter (Signed)
Relevant patient education assigned to patient using Emmi. ° °

## 2013-12-13 ENCOUNTER — Other Ambulatory Visit: Payer: Self-pay | Admitting: Internal Medicine

## 2013-12-14 NOTE — Telephone Encounter (Signed)
Rx printed and faxed to the pharmacy.//AB/CMA 

## 2013-12-14 NOTE — Telephone Encounter (Signed)
OK X1 

## 2013-12-14 NOTE — Telephone Encounter (Signed)
diazepam (VALIUM) 5 MG tablet Last refill: 11/14/12 #30, 0 refills Last OV: 11/22/13 UDS up-to-date, low risk

## 2013-12-19 ENCOUNTER — Other Ambulatory Visit: Payer: Medicare Other

## 2013-12-19 ENCOUNTER — Other Ambulatory Visit (INDEPENDENT_AMBULATORY_CARE_PROVIDER_SITE_OTHER): Payer: Medicare Other

## 2013-12-19 DIAGNOSIS — E1365 Other specified diabetes mellitus with hyperglycemia: Secondary | ICD-10-CM

## 2013-12-19 DIAGNOSIS — N289 Disorder of kidney and ureter, unspecified: Secondary | ICD-10-CM

## 2013-12-19 DIAGNOSIS — E1329 Other specified diabetes mellitus with other diabetic kidney complication: Secondary | ICD-10-CM

## 2013-12-19 DIAGNOSIS — R799 Abnormal finding of blood chemistry, unspecified: Secondary | ICD-10-CM

## 2013-12-19 DIAGNOSIS — R7989 Other specified abnormal findings of blood chemistry: Secondary | ICD-10-CM

## 2013-12-19 LAB — HEMOGLOBIN A1C: Hgb A1c MFr Bld: 7.5 % — ABNORMAL HIGH (ref 4.6–6.5)

## 2013-12-19 LAB — MICROALBUMIN / CREATININE URINE RATIO
CREATININE, U: 236.3 mg/dL
Microalb Creat Ratio: 65.9 mg/g — ABNORMAL HIGH (ref 0.0–30.0)
Microalb, Ur: 155.7 mg/dL — ABNORMAL HIGH (ref 0.0–1.9)

## 2013-12-19 LAB — BASIC METABOLIC PANEL
BUN: 24 mg/dL — ABNORMAL HIGH (ref 6–23)
CO2: 23 mEq/L (ref 19–32)
Calcium: 9.5 mg/dL (ref 8.4–10.5)
Chloride: 101 mEq/L (ref 96–112)
Creatinine, Ser: 1.3 mg/dL — ABNORMAL HIGH (ref 0.4–1.2)
GFR: 43.69 mL/min — ABNORMAL LOW (ref >60.00)
GLUCOSE: 173 mg/dL — AB (ref 70–99)
Potassium: 3.6 mEq/L (ref 3.5–5.1)
SODIUM: 137 meq/L (ref 135–145)

## 2013-12-22 ENCOUNTER — Encounter: Payer: Self-pay | Admitting: Internal Medicine

## 2013-12-27 ENCOUNTER — Other Ambulatory Visit: Payer: Self-pay | Admitting: Vascular Surgery

## 2013-12-27 DIAGNOSIS — I714 Abdominal aortic aneurysm, without rupture, unspecified: Secondary | ICD-10-CM

## 2013-12-31 ENCOUNTER — Encounter: Payer: Self-pay | Admitting: Internal Medicine

## 2014-01-02 ENCOUNTER — Encounter: Payer: Self-pay | Admitting: *Deleted

## 2014-01-09 ENCOUNTER — Other Ambulatory Visit: Payer: Self-pay

## 2014-01-09 DIAGNOSIS — Z1231 Encounter for screening mammogram for malignant neoplasm of breast: Secondary | ICD-10-CM

## 2014-01-11 ENCOUNTER — Encounter: Payer: Self-pay | Admitting: Internal Medicine

## 2014-01-11 ENCOUNTER — Other Ambulatory Visit: Payer: Self-pay | Admitting: *Deleted

## 2014-01-11 ENCOUNTER — Ambulatory Visit (INDEPENDENT_AMBULATORY_CARE_PROVIDER_SITE_OTHER): Payer: Medicare Other | Admitting: Internal Medicine

## 2014-01-11 VITALS — BP 146/60 | HR 85 | Temp 97.9°F | Wt 157.4 lb

## 2014-01-11 DIAGNOSIS — E1165 Type 2 diabetes mellitus with hyperglycemia: Principal | ICD-10-CM

## 2014-01-11 DIAGNOSIS — F17200 Nicotine dependence, unspecified, uncomplicated: Secondary | ICD-10-CM

## 2014-01-11 DIAGNOSIS — E1129 Type 2 diabetes mellitus with other diabetic kidney complication: Secondary | ICD-10-CM

## 2014-01-11 MED ORDER — AMLODIPINE BESYLATE 5 MG PO TABS: 5.0000 mg | ORAL_TABLET | Freq: Two times a day (BID) | ORAL | Status: DC

## 2014-01-11 NOTE — Progress Notes (Signed)
Pre visit review using our clinic review tool, if applicable. No additional management support is needed unless otherwise documented below in the visit note. 

## 2014-01-11 NOTE — Progress Notes (Signed)
Subjective:    Patient ID: Sheila Melton, female    DOB: [birth year], 73 y.o.   MRN: [id number]  HPI Diabetes status assessment: Fasting or morning glucose range average;<150 Highest glucose 2 hours after any meal is <210. No hypoglycemia reported                                                                                                                 No regular exercise described. No specific nutrition/diet followed except modified low carb Medication compliance is good. No medication adverse effects noted. Eye exam pending next week Foot care not current  A1c/ urine microalbumin monitor  12/19/13 : 7.5%/155.7    Review of Systems  No excess thirst ;  excess hunger ; or excess urination reported                              No lightheadedness with standing reported No chest pain ; palpitations ; claudication described .                                                                                                                             No non healing skin  ulcers or sores of extremities noted. Occasional am hand numbness. No tingling or burning in feet described                                                                                                                                             Change in weight of  3 pound gain . No blurred,double, or loss of vision reported  .            Objective:   Physical Exam Gen.: Healthy and well-nourished in appearance. Alert, appropriate and cooperative throughout exam. Appears younger than stated age   Eyes: No corneal  or conjunctival inflammation noted.  Neck: No deformities, masses, or tenderness noted.  Thyroid normal. Lungs: Normal respiratory effort; chest expands symmetrically. Lungs are clear to auscultation without rales, wheezes, or increased work of breathing. Heart: Normal rate and rhythm. Normal S1 and S2. No gallop, click, or rub. Grade 1/2 murmur. Abdomen: Bowel sounds normal; abdomen soft and  nontender. No masses, organomegaly or hernias noted.                                Musculoskeletal/extremities: No clubbing, cyanosis, edema, or significant extremity  deformity noted. Tone & strength normal. Hand joints normal Fingernail  health good. Able to lie down & sit up w/o help.  Vascular: Carotid, radial artery, dorsalis pedis and  posterior tibial pulses are full and equal. No bruits present. Neurologic: Alert and oriented x3.  Gait normal   Skin: Intact without suspicious lesions or rashes. Lymph: No cervical, axillary lymphadenopathy present. Psych: Mood and affect are normal. Normally interactive                                                                                        Assessment & Plan:  #1 DM stable to improved except for urine microalbumin Labs 4 mos

## 2014-01-11 NOTE — Patient Instructions (Addendum)
Labs week of 04/10/14 Consider CVE discussed

## 2014-01-11 NOTE — Telephone Encounter (Signed)
Rx sent to the pharmacy by e-script.//AB/CMA 

## 2014-01-13 ENCOUNTER — Telehealth: Payer: Self-pay

## 2014-01-13 NOTE — Telephone Encounter (Signed)
Relevant patient education assigned to patient using Emmi. ° °

## 2014-01-16 ENCOUNTER — Other Ambulatory Visit: Payer: Self-pay | Admitting: *Deleted

## 2014-01-16 MED ORDER — DIAZEPAM 5 MG PO TABS: ORAL_TABLET | ORAL | Status: DC

## 2014-01-16 NOTE — Telephone Encounter (Signed)
Rx printed and faxed to the pharmacy.//AB/CMA 

## 2014-01-18 ENCOUNTER — Ambulatory Visit
Admission: RE | Admit: 2014-01-18 | Discharge: 2014-01-18 | Disposition: A | Discharge status: Home / Self Care | Payer: Medicare Other | Source: Ambulatory Visit

## 2014-01-18 DIAGNOSIS — Z1231 Encounter for screening mammogram for malignant neoplasm of breast: Secondary | ICD-10-CM

## 2014-01-29 ENCOUNTER — Encounter: Payer: Self-pay | Admitting: Internal Medicine

## 2014-02-13 ENCOUNTER — Other Ambulatory Visit: Payer: Self-pay | Admitting: Internal Medicine

## 2014-02-13 MED ORDER — DIAZEPAM 5 MG PO TABS: ORAL_TABLET | ORAL | Status: DC

## 2014-02-13 NOTE — Telephone Encounter (Signed)
LAST SEEN 01/11/14\ CONTROLLED SUBSTANCE ON FILE

## 2014-02-14 ENCOUNTER — Other Ambulatory Visit: Payer: Self-pay | Admitting: Internal Medicine

## 2014-02-15 NOTE — Telephone Encounter (Signed)
Rx phoned in to the pharmacy([name]).//AB/CMA

## 2014-03-16 ENCOUNTER — Other Ambulatory Visit: Payer: Self-pay

## 2014-03-16 MED ORDER — DIAZEPAM 5 MG PO TABS: ORAL_TABLET | ORAL | Status: DC

## 2014-03-16 NOTE — Telephone Encounter (Signed)
Last ov on 01/11/14

## 2014-03-16 NOTE — Telephone Encounter (Signed)
OK X1 

## 2014-03-30 ENCOUNTER — Encounter: Payer: Self-pay | Admitting: Internal Medicine

## 2014-04-17 ENCOUNTER — Other Ambulatory Visit: Payer: Self-pay

## 2014-04-17 DIAGNOSIS — E785 Hyperlipidemia, unspecified: Secondary | ICD-10-CM

## 2014-04-17 MED ORDER — DIAZEPAM 5 MG PO TABS: ORAL_TABLET | ORAL | Status: DC

## 2014-04-17 MED ORDER — LABETALOL HCL 300 MG PO TABS: 300.0000 mg | ORAL_TABLET | Freq: Two times a day (BID) | ORAL | Status: DC

## 2014-04-17 MED ORDER — PRAVASTATIN SODIUM 40 MG PO TABS: ORAL_TABLET | ORAL | Status: DC

## 2014-04-17 NOTE — Telephone Encounter (Signed)
Last office visit on 01/11/2014 Medication last filled 03/16/14 #30

## 2014-04-17 NOTE — Telephone Encounter (Signed)
OK X1 

## 2014-05-09 ENCOUNTER — Other Ambulatory Visit (INDEPENDENT_AMBULATORY_CARE_PROVIDER_SITE_OTHER): Payer: Medicare Other

## 2014-05-09 DIAGNOSIS — E1129 Type 2 diabetes mellitus with other diabetic kidney complication: Secondary | ICD-10-CM

## 2014-05-09 DIAGNOSIS — E1165 Type 2 diabetes mellitus with hyperglycemia: Principal | ICD-10-CM

## 2014-05-09 LAB — BASIC METABOLIC PANEL
BUN: 21 mg/dL (ref 6–23)
CHLORIDE: 101 meq/L (ref 96–112)
CO2: 29 mEq/L (ref 19–32)
CREATININE: 1.3 mg/dL — AB (ref 0.4–1.2)
Calcium: 9.5 mg/dL (ref 8.4–10.5)
GFR: 43.65 mL/min — ABNORMAL LOW (ref >60.00)
Glucose, Bld: 217 mg/dL — ABNORMAL HIGH (ref 70–99)
POTASSIUM: 4 meq/L (ref 3.5–5.1)
Sodium: 137 mEq/L (ref 135–145)

## 2014-05-09 LAB — HEMOGLOBIN A1C: Hgb A1c MFr Bld: 8.1 % — ABNORMAL HIGH (ref 4.6–6.5)

## 2014-05-09 LAB — MICROALBUMIN / CREATININE URINE RATIO
CREATININE, U: 92.2 mg/dL
Microalb Creat Ratio: 103.6 mg/g — ABNORMAL HIGH (ref 0.0–30.0)
Microalb, Ur: 95.5 mg/dL — ABNORMAL HIGH (ref 0.0–1.9)

## 2014-05-15 ENCOUNTER — Other Ambulatory Visit: Payer: Self-pay

## 2014-05-15 MED ORDER — VALSARTAN 320 MG PO TABS: 320.0000 mg | ORAL_TABLET | Freq: Every day | ORAL | Status: DC

## 2014-05-16 ENCOUNTER — Other Ambulatory Visit: Payer: Self-pay

## 2014-05-16 MED ORDER — DIAZEPAM 5 MG PO TABS: ORAL_TABLET | ORAL | Status: DC

## 2014-05-16 NOTE — Telephone Encounter (Signed)
OK X1 Prn only

## 2014-05-17 ENCOUNTER — Telehealth: Payer: Self-pay | Admitting: *Deleted

## 2014-05-17 DIAGNOSIS — IMO0001 Reserved for inherently not codable concepts without codable children: Secondary | ICD-10-CM

## 2014-05-17 DIAGNOSIS — E1165 Type 2 diabetes mellitus with hyperglycemia: Principal | ICD-10-CM

## 2014-05-17 NOTE — Telephone Encounter (Signed)
Patient coming in for an office visit to follow up with DM. Patient also going to the lab for cholesterol check. Lipid panel ordered Diabetic bundle

## 2014-05-24 ENCOUNTER — Other Ambulatory Visit (INDEPENDENT_AMBULATORY_CARE_PROVIDER_SITE_OTHER): Payer: Medicare Other

## 2014-05-24 ENCOUNTER — Encounter: Payer: Self-pay | Admitting: Internal Medicine

## 2014-05-24 ENCOUNTER — Ambulatory Visit (INDEPENDENT_AMBULATORY_CARE_PROVIDER_SITE_OTHER): Payer: Medicare Other | Admitting: Internal Medicine

## 2014-05-24 VITALS — BP 118/90 | HR 79 | Temp 97.9°F | Wt 159.2 lb

## 2014-05-24 DIAGNOSIS — E1165 Type 2 diabetes mellitus with hyperglycemia: Principal | ICD-10-CM

## 2014-05-24 DIAGNOSIS — N289 Disorder of kidney and ureter, unspecified: Secondary | ICD-10-CM

## 2014-05-24 DIAGNOSIS — E1129 Type 2 diabetes mellitus with other diabetic kidney complication: Secondary | ICD-10-CM

## 2014-05-24 DIAGNOSIS — IMO0001 Reserved for inherently not codable concepts without codable children: Secondary | ICD-10-CM

## 2014-05-24 DIAGNOSIS — I10 Essential (primary) hypertension: Secondary | ICD-10-CM

## 2014-05-24 DIAGNOSIS — Z87891 Personal history of nicotine dependence: Secondary | ICD-10-CM | POA: Insufficient documentation

## 2014-05-24 LAB — LIPID PANEL
Cholesterol: 240 mg/dL — ABNORMAL HIGH (ref 0–200)
HDL: 39.3 mg/dL (ref >39.00)
LDL Cholesterol: 123 mg/dL — ABNORMAL HIGH (ref 0–99)
NonHDL: 200.7
TRIGLYCERIDES: 387 mg/dL — AB (ref 0.0–149.0)
Total CHOL/HDL Ratio: 6
VLDL: 77.4 mg/dL — ABNORMAL HIGH (ref 0.0–40.0)

## 2014-05-24 MED ORDER — PIOGLITAZONE HCL 15 MG PO TABS: 15.0000 mg | ORAL_TABLET | Freq: Every day | ORAL | Status: DC

## 2014-05-24 NOTE — Patient Instructions (Signed)
Please provide me with your 2015 drug formulary for  Diabetes medications.We need to verify what are the best options for you in reference to effectiveness and cost among the agents your company will cover.

## 2014-05-24 NOTE — Progress Notes (Signed)
Pre visit review using our clinic review tool, if applicable. No additional management support is needed unless otherwise documented below in the visit note. 

## 2014-05-24 NOTE — Progress Notes (Signed)
   Subjective:    Patient ID: Sheila Melton, female    DOB: 1941/01/06, 73 y.o.   MRN: [id number]  HPI   She is counting carbs at home. Fasting glucose average is 158. The high 240  2 hours after meals . The low was in the 180s.  Ophth exam in April of this year revealed no retinopathy.  She does have numbness in the 3 middle toes of the right foot but this is chronic. This occurred after chiropractic manipulation and prior to her neurosurgery.  Blood pressures range from 127-140+ over 60-80 at home. She's been compliant with her blood pressure medicines and has no adverse effects.  She also has been compliant with the cholesterol medicine. She denies any associated adverse issues  She is using E cigarette and not smoking.  Her most recent A1c was 8.1% on 05/09/14. She questioned whether this might be related to oral steroid she took in February or March of this year her from her orthopedic back specialist. I assured her that this would not be causing this elevation. She previously been on metformin but this was discontinued because of creatinine 1.5. Her present creatinine is 1.3 and GFR 43.65. Urine microalbumin is 95.5.  Fasting lipids pending    Review of Systems  She denies blurred vision, double vision, or loss of vision  She has no polydipsia, polyuria, polyphasia  She has no abdominal symptoms, constipation, or diarrhea.  She also denies myalgias or significant loss of memory  She has no significant respiratory compromise at this time and denies cough or sputum production.     Objective:   Physical Exam  Significant or distinguishing  findings on physical exam are documented first.  Below that are other systems examined & findings.  There is a large ganglion of the left ventral wrist Pedal pulses are decreased. There are  no associated ischemic changes.  General appearance is one of good health and nourishment w/o distress. Eyes: No conjunctival inflammation or  scleral icterus is present. Oral exam: Dental hygiene is good; lips and gums are healthy appearing.There is no oropharyngeal erythema or exudate noted.  Heart:  Normal rate and regular rhythm. S1 and S2 normal without gallop, murmur, click, rub or other extra sounds Lungs:Chest clear to auscultation; no wheezes, rhonchi,rales ,or rubs present.No increased work of breathing.  Abdomen: bowel sounds normal, soft and non-tender without masses, organomegaly or hernias noted.  No guarding or rebound . No tenderness over the flanks to percussion Musculoskeletal: Able to lie flat and sit up without help. Negative straight leg raising bilaterally. Gait normal Skin:Warm & dry.  Intact without suspicious lesions or rashes ; no jaundice or tenting Lymphatic: No lymphadenopathy is noted about the head, neck, axilla  Neuro: DTRs WNL. Light touch normal.              Assessment & Plan:  #1 diabetes, suboptimal control. Metformin is not an option because of renal insufficiency. She would be at high risk for hypoglycemia with sulfonylurea. Prednisone will be initiated at low dose. She'll be asked to check for drug formulary to assess the optimal choices.  #2 hypertension home readings suggest good control. No changes will be made  #3 lipidemia; lipid studies pending.

## 2014-06-13 ENCOUNTER — Other Ambulatory Visit (HOSPITAL_COMMUNITY): Payer: Medicare Other

## 2014-06-13 ENCOUNTER — Ambulatory Visit: Payer: Medicare Other | Admitting: Vascular Surgery

## 2014-06-15 ENCOUNTER — Other Ambulatory Visit: Payer: Self-pay

## 2014-06-15 MED ORDER — DIAZEPAM 5 MG PO TABS: ORAL_TABLET | ORAL | Status: DC

## 2014-06-15 NOTE — Telephone Encounter (Signed)
OK X1 

## 2014-06-15 NOTE — Telephone Encounter (Signed)
Med last filled 05/16/14 #30 no refills  Last ov 05/24/14

## 2014-06-19 ENCOUNTER — Encounter: Payer: Self-pay | Admitting: Family

## 2014-06-20 ENCOUNTER — Ambulatory Visit (INDEPENDENT_AMBULATORY_CARE_PROVIDER_SITE_OTHER): Payer: Medicare Other | Admitting: Family

## 2014-06-20 ENCOUNTER — Ambulatory Visit (HOSPITAL_COMMUNITY)
Admission: RE | Admit: 2014-06-20 | Discharge: 2014-06-20 | Disposition: A | Discharge status: Home / Self Care | Payer: Medicare Other | Source: Ambulatory Visit | Attending: Family | Admitting: Family

## 2014-06-20 ENCOUNTER — Encounter: Payer: Self-pay | Admitting: Family

## 2014-06-20 VITALS — BP 131/66 | HR 70 | Resp 16 | Ht 65.0 in | Wt 157.0 lb

## 2014-06-20 DIAGNOSIS — I714 Abdominal aortic aneurysm, without rupture, unspecified: Secondary | ICD-10-CM

## 2014-06-20 NOTE — Patient Instructions (Signed)
Abdominal Aortic Aneurysm An aneurysm is a weakened or damaged part of an artery wall that bulges from the normal force of blood pumping through the body. An abdominal aortic aneurysm is an aneurysm that occurs in the lower part of the aorta, the main artery of the body.  The major concern with an abdominal aortic aneurysm is that it can enlarge and burst (rupture) or blood can flow between the layers of the wall of the aorta through a tear (aorticdissection). Both of these conditions can cause bleeding inside the body and can be life threatening unless diagnosed and treated promptly. CAUSES  The exact cause of an abdominal aortic aneurysm is unknown. Some contributing factors are:   A hardening of the arteries caused by the buildup of fat and other substances in the lining of a blood vessel (arteriosclerosis).  Inflammation of the walls of an artery (arteritis).   Connective tissue diseases, such as Marfan syndrome.   Abdominal trauma.   An infection, such as syphilis or staphylococcus, in the wall of the aorta (infectious aortitis) caused by bacteria. RISK FACTORS  Risk factors that contribute to an abdominal aortic aneurysm may include:  Age older than 60 years.   High blood pressure (hypertension).  Female gender.  Ethnicity (white race).  Obesity.  Family history of aneurysm (first degree relatives only).  Tobacco use. PREVENTION  The following healthy lifestyle habits may help decrease your risk of abdominal aortic aneurysm:  Quitting smoking. Smoking can raise your blood pressure and cause arteriosclerosis.  Limiting or avoiding alcohol.  Keeping your blood pressure, blood sugar level, and cholesterol levels within normal limits.  Decreasing your salt intake. In somepeople, too much salt can raise blood pressure and increase your risk of abdominal aortic aneurysm.  Eating a diet low in saturated fats and cholesterol.  Increasing your fiber intake by including  whole grains, vegetables, and fruits in your diet. Eating these foods may help lower blood pressure.  Maintaining a healthy weight.  Staying physically active and exercising regularly. SYMPTOMS  The symptoms of abdominal aortic aneurysm may vary depending on the size and rate of growth of the aneurysm.Most grow slowly and do not have any symptoms. When symptoms do occur, they may include:  Pain (abdomen, side, lower back, or groin). The pain may vary in intensity. A sudden onset of severe pain may indicate that the aneurysm has ruptured.  Feeling full after eating only small amounts of food.  Nausea or vomiting or both.  Feeling a pulsating lump in the abdomen.  Feeling faint or passing out. DIAGNOSIS  Since most unruptured abdominal aortic aneurysms have no symptoms, they are often discovered during diagnostic exams for other conditions. An aneurysm may be found during the following procedures:  Ultrasonography (A one-time screening for abdominal aortic aneurysm by ultrasonography is also recommended for all men aged 65-75 years who have ever smoked).  X-ray exams.  A computed tomography (CT).  Magnetic resonance imaging (MRI).  Angiography or arteriography. TREATMENT  Treatment of an abdominal aortic aneurysm depends on the size of your aneurysm, your age, and risk factors for rupture. Medication to control blood pressure and pain may be used to manage aneurysms smaller than 6 cm. Regular monitoring for enlargement may be recommended by your caregiver if:  The aneurysm is 3-4 cm in size (an annual ultrasonography may be recommended).  The aneurysm is 4-4.5 cm in size (an ultrasonography every 6 months may be recommended).  The aneurysm is larger than 4.5 cm in   size (your caregiver may ask that you be examined by a vascular surgeon). If your aneurysm is larger than 6 cm, surgical repair may be recommended. There are two main methods for repair of an aneurysm:   Endovascular  repair (a minimally invasive surgery). This is done most often.  Open repair. This method is used if an endovascular repair is not possible. Document Released: 08/06/2005 Document Revised: 02/21/2013 Document Reviewed: 11/26/2012 Exit[employer] Patient Information 2015 [employer]. This information is not intended to replace advice given to you by your health care provider. Make sure you discuss any questions you have with your health care provider.   Smoking Cessation Quitting smoking is important to your health and has many advantages. However, it is not always easy to quit since nicotine is a very addictive drug. Oftentimes, people try 3 times or more before being able to quit. This document explains the best ways for you to prepare to quit smoking. Quitting takes hard work and a lot of effort, but you can do it. ADVANTAGES OF QUITTING SMOKING  You will live longer, feel better, and live better.  Your body will feel the impact of quitting smoking almost immediately.  Within 20 minutes, blood pressure decreases. Your pulse returns to its normal level.  After 8 hours, carbon monoxide levels in the blood return to normal. Your oxygen level increases.  After 24 hours, the chance of having a heart attack starts to decrease. Your breath, hair, and body stop smelling like smoke.  After 48 hours, damaged nerve endings begin to recover. Your sense of taste and smell improve.  After 72 hours, the body is virtually free of nicotine. Your bronchial tubes relax and breathing becomes easier.  After 2 to 12 weeks, lungs can hold more air. Exercise becomes easier and circulation improves.  The risk of having a heart attack, stroke, cancer, or lung disease is greatly reduced.  After 1 year, the risk of coronary heart disease is cut in half.  After 5 years, the risk of stroke falls to the same as a nonsmoker.  After 10 years, the risk of lung cancer is cut in half and the risk of other cancers  decreases significantly.  After 15 years, the risk of coronary heart disease drops, usually to the level of a nonsmoker.  If you are pregnant, quitting smoking will improve your chances of having a healthy baby.  The people you live with, especially any children, will be healthier.  You will have extra money to spend on things other than cigarettes. QUESTIONS TO THINK ABOUT BEFORE ATTEMPTING TO QUIT You may want to talk about your answers with your health care provider.  Why do you want to quit?  If you tried to quit in the past, what helped and what did not?  What will be the most difficult situations for you after you quit? How will you plan to handle them?  Who can help you through the tough times? Your family? Friends? A health care provider?  What pleasures do you get from smoking? What ways can you still get pleasure if you quit? Here are some questions to ask your health care provider:  How can you help me to be successful at quitting?  What medicine do you think would be best for me and how should I take it?  What should I do if I need more help?  What is smoking withdrawal like? How can I get information on withdrawal? GET READY  Set a quit   date.  Change your environment by getting rid of all cigarettes, ashtrays, matches, and lighters in your home, car, or work. Do not let people smoke in your home.  Review your past attempts to quit. Think about what worked and what did not. GET SUPPORT AND ENCOURAGEMENT You have a better chance of being successful if you have help. You can get support in many ways.  Tell your family, friends, and coworkers that you are going to quit and need their support. Ask them not to smoke around you.  Get individual, group, or telephone counseling and support. Programs are available at local hospitals and health centers. Call your local health department for information about programs in your area.  Spiritual beliefs and practices may  help some smokers quit.  Download a "quit meter" on your computer to keep track of quit statistics, such as how long you have gone without smoking, cigarettes not smoked, and money saved.  Get a self-help book about quitting smoking and staying off tobacco. LEARN NEW SKILLS AND BEHAVIORS  Distract yourself from urges to smoke. Talk to someone, go for a walk, or occupy your time with a task.  Change your normal routine. Take a different route to work. Drink tea instead of coffee. Eat breakfast in a different place.  Reduce your stress. Take a hot bath, exercise, or read a book.  Plan something enjoyable to do every day. Reward yourself for not smoking.  Explore interactive web-based programs that specialize in helping you quit. GET MEDICINE AND USE IT CORRECTLY Medicines can help you stop smoking and decrease the urge to smoke. Combining medicine with the above behavioral methods and support can greatly increase your chances of successfully quitting smoking.  Nicotine replacement therapy helps deliver nicotine to your body without the negative effects and risks of smoking. Nicotine replacement therapy includes nicotine gum, lozenges, inhalers, nasal sprays, and skin patches. Some may be available over-the-counter and others require a prescription.  Antidepressant medicine helps people abstain from smoking, but how this works is unknown. This medicine is available by prescription.  Nicotinic receptor partial agonist medicine simulates the effect of nicotine in your brain. This medicine is available by prescription. Ask your health care provider for advice about which medicines to use and how to use them based on your health history. Your health care provider will tell you what side effects to look out for if you choose to be on a medicine or therapy. Carefully read the information on the package. Do not use any other product containing nicotine while using a nicotine replacement product.    RELAPSE OR DIFFICULT SITUATIONS Most relapses occur within the first 3 months after quitting. Do not be discouraged if you start smoking again. Remember, most people try several times before finally quitting. You may have symptoms of withdrawal because your body is used to nicotine. You may crave cigarettes, be irritable, feel very hungry, cough often, get headaches, or have difficulty concentrating. The withdrawal symptoms are only temporary. They are strongest when you first quit, but they will go away within 10-14 days. To reduce the chances of relapse, try to:  Avoid drinking alcohol. Drinking lowers your chances of successfully quitting.  Reduce the amount of caffeine you consume. Once you quit smoking, the amount of caffeine in your body increases and can give you symptoms, such as a rapid heartbeat, sweating, and anxiety.  Avoid smokers because they can make you want to smoke.  Do not let weight gain distract you. Many   smokers will gain weight when they quit, usually less than 10 pounds. Eat a healthy diet and stay active. You can always lose the weight gained after you quit.  Find ways to improve your mood other than smoking. FOR MORE INFORMATION  www.smokefree.gov  Document Released: 10/21/2001 Document Revised: 03/13/2014 Document Reviewed: 02/05/2012 [employer] Patient Information 2015 [employer]. This information is not intended to replace advice given to you by your health care provider. Make sure you discuss any questions you have with your health care provider.  

## 2014-06-20 NOTE — Progress Notes (Signed)
[facility] [place]  Established Abdominal Aortic Aneurysm  History of Present Illness  Sheila Melton is a 73 y.o. (Mar 21, 1941) female patient of Dr. Donnetta Hutching who returns for followup of her small asymptomatic infrarenal abdominal aortic aneurysm.  Previous studies demonstrate an AAA, measuring 3.67 cm.  She has had lumbar disc surgery by Dr. Trenton Gammon. Sometimes she has low back pain,  denies radiculopathy pain, states her back surgery resolved the radiculopathy pain. Pt denies abdominal pain. The patient is former a smoker  But is using vapor cigarettes with nicotine. The patient reports pain in both hips and legs after walking about 10 minutes, she does not walk much due to this pain.  The patient denies history of stroke or TIA symptoms. Pt denies family history of AAA. Pt states her cardiologist or her PCP are checking an [place] of her neck.  Pt Diabetic: Yes, started taking Actos, was taking metformin, states her kidney function stopped the metformin, took prednisone recently and her A1C 3-4 weeks ago was 8.0.  Pt smoker: former smoker, quit cigarettes in 2014, using vapor cigarettes with nicotine  Past Medical History  Diagnosis Date  . Hyperlipidemia   . Diabetes mellitus     2  . Hypertension     unspecified essential  . Family history of anesthesia complication     PATIENTS MOM HAD TROUBLE WAKING UP   . Heart murmur   . Subclavian steal syndrome   . Subclavian steal syndrome     LEFT SIDE   NO SIDE EFFECTS  . AAA (abdominal aortic aneurysm)     BEING WATCHED   [facility]   . Arthritis   . CAP (community acquired pneumonia) 10/24-27/14     with SIRS  . UTI (lower urinary tract infection) 08/26/13    Enterococcus and Escherichia coli both sensitive to nitrofurantoin   Past Surgical History  Procedure Laterality Date  . Cataract extraction      OS  . G1 p1    . Abdominal hysterectomy    . Dilation and curettage of uterus    . Cyst excision Right 05-23-13   Right index finger: cyst  . Spine surgery  Sept. 2013  . Eye surgery     Social History History   Social History  . Marital Status: Married    Spouse Name: N/A    Number of Children: N/A  . Years of Education: N/A   Occupational History  . Not on file.   Social History Main Topics  . Smoking status: Light Tobacco Smoker -- 1.00 packs/day for 50 years    Types: E-cigarettes  . Smokeless tobacco: Never Used  . Alcohol Use: No  . Drug Use: No  . Sexual Activity: Not on file   Other Topics Concern  . Not on file   Social History Narrative   Has 3 caffeinated bev a day   Family History Family History  Problem Relation Age of Onset  . Coronary artery disease    . Diabetes    . Heart failure Mother   . Heart disease Mother   . Hypertension Mother   . Other Mother     varicose veins  . Deep vein thrombosis Mother   . Varicose Veins Mother   . Heart attack Mother   . Hypertension Sister     X 2  . Diabetes Sister   . Hyperlipidemia Sister   . Other Sister     varicose veins  . Heart disease Father   .  Diabetes Father   . Hyperlipidemia Father   . Hypertension Father   . Heart attack Father   . Heart disease Brother     MI in late 48s  . Hyperlipidemia Brother   . Heart attack Brother   . Parkinsonism Brother   . Heart failure Brother   . Hypertension Daughter     Current Outpatient Prescriptions on File Prior to Visit  Medication Sig Dispense Refill  . acetaminophen (TYLENOL) 500 MG tablet Take 500 mg by mouth every 6 (six) hours as needed for pain.      Marland Kitchen amLODipine (NORVASC) 5 MG tablet Take 1 tablet (5 mg total) by mouth 2 (two) times daily.  180 tablet  1  . azelastine (ASTELIN) 137 MCG/SPRAY nasal spray PLACE 1 SPRAY INTO THE NOSE 2 (TWO) TIMES DAILY. USE IN EACH NOSTRIL AS DIRECTED  30 mL  6  . B Complex-C (SUPER B COMPLEX PO) Take 1 tablet by mouth daily.       . Cholecalciferol (VITAMIN D3) 2000 UNITS TABS Take 2,000 Units by mouth daily.       .  diazepam (VALIUM) 5 MG tablet TAKE 1 TABLET BY MOUTH AT BEDTIME AS NEEDED  30 tablet  0  . fenofibrate micronized (LOFIBRA) 134 MG capsule Take 134 mg by mouth daily before breakfast.      . fexofenadine (ALLEGRA) 180 MG tablet Take 180 mg by mouth at bedtime.       Marland Kitchen labetalol (NORMODYNE) 300 MG tablet Take 1 tablet (300 mg total) by mouth 2 (two) times daily.  180 tablet  3  . pioglitazone (ACTOS) 15 MG tablet Take 1 tablet (15 mg total) by mouth daily.  30 tablet  3  . pravastatin (PRAVACHOL) 40 MG tablet 1 by mouth daily  90 tablet  3  . valsartan (DIOVAN) 320 MG tablet Take 1 tablet (320 mg total) by mouth at bedtime.  90 tablet  3  . vitamin E 400 UNIT capsule Take 400 Units by mouth daily.       No current facility-administered medications on file prior to visit.   Allergies  Allergen Reactions  . Amoxicillin     REACTION: rash  . Rocephin [Ceftriaxone Sodium In Dextrose]     10/24 /14 blisters of palms & diffuse rash Because of a history of documented adverse serious drug reaction;Medi Alert bracelet  is recommended  . Captopril     REACTION: cough  . Simvastatin     REACTION: buttocks cramps  . Adhesive [Tape] Rash    ROS: See HPI for pertinent positives and negatives.  Physical Examination  Filed Vitals:   06/20/14 1017  BP: 131/66  Pulse: 70  Resp: 16  Height: 5\' 5"  (1.651 m)  Weight: 157 lb (71.215 kg)  SpO2: 99%   Body mass index is 26.13 kg/(m^2).  General: A&O x 3, WD.  Pulmonary: Sym exp, good air movt, CTAB, no rales, rhonchi, or wheezing.  Cardiac: RRR, Nl S1, S2, no detected murmur.   Carotid Bruits Right Left   Positive Negative   Aorta is not palpable Radial pulses are 2+ right, 1+ left                          VASCULAR EXAM:  LE Pulses Right Left       FEMORAL  2+ palpable  not palpable        POPLITEAL  not palpable   not palpable        POSTERIOR TIBIAL  2+ palpable   1+ palpable        DORSALIS PEDIS      ANTERIOR TIBIAL not palpable  not palpable      Gastrointestinal: soft, NTND, -G/R, - HSM, - masses, - CVAT B.  Musculoskeletal: M/S 5/5 throughout in UE's, 3/5 in LE's, Extremities without ischemic changes.  Neurologic: CN 2-12 intact, Motor exam as listed above.  Non-Invasive Vascular Imaging  AAA Duplex (06/20/2014)  Previous size: 3.67 cm (Date: 06/07/13)  Current size:  3.72 cm (Date: 06/20/2014)  Medical Decision Making  The patient is a 73 y.o. female who presents with asymptomatic small AAA with no significant increase in size.   Based on this patient's exam and diagnostic studies, the patient will follow up in 1 year  with the following studies: AAA Duplex and ABI's.  Consideration for repair of AAA would be made when the size is 5.5 cm, growth > 1 cm/yr, and symptomatic status.  I emphasized the importance of maximal medical management including strict control of blood pressure, blood glucose, and lipid levels, antiplatelet agents, obtaining regular exercise, and cessation of nicotine.   The patient was given information about AAA including signs, symptoms, treatment, and how to minimize the risk of enlargement and rupture of aneurysms.    The patient was advised to call 911 should the patient experience sudden onset abdominal or back pain.   Thank you for allowing [place] to participate in this patient's care.  Clemon Chambers, RN, MSN, FNP-C [facility] [place] Office: [phone]  Clinic Physician: Early  06/20/2014, 10:37 AM

## 2014-07-03 ENCOUNTER — Other Ambulatory Visit: Payer: Self-pay

## 2014-07-03 MED ORDER — AMLODIPINE BESYLATE 5 MG PO TABS: 5.0000 mg | ORAL_TABLET | Freq: Two times a day (BID) | ORAL | Status: DC

## 2014-07-03 MED ORDER — FENOFIBRATE MICRONIZED 134 MG PO CAPS: 134.0000 mg | ORAL_CAPSULE | Freq: Every day | ORAL | Status: DC

## 2014-07-15 ENCOUNTER — Other Ambulatory Visit: Payer: Self-pay | Admitting: Internal Medicine

## 2014-07-18 NOTE — Telephone Encounter (Signed)
Diazepam has been called to pharmacy

## 2014-07-18 NOTE — Telephone Encounter (Signed)
OK X1 if not too soon

## 2014-07-31 ENCOUNTER — Other Ambulatory Visit: Payer: Self-pay | Admitting: Internal Medicine

## 2014-08-16 ENCOUNTER — Other Ambulatory Visit: Payer: Self-pay

## 2014-08-16 MED ORDER — DIAZEPAM 5 MG PO TABS: ORAL_TABLET | ORAL | Status: DC

## 2014-08-16 NOTE — Telephone Encounter (Signed)
Diazepam has been called to pharmacy

## 2014-08-16 NOTE — Telephone Encounter (Signed)
OK #30 but try 1/2 qhs as often as possible

## 2014-08-18 ENCOUNTER — Ambulatory Visit (INDEPENDENT_AMBULATORY_CARE_PROVIDER_SITE_OTHER): Payer: Medicare Other

## 2014-08-18 DIAGNOSIS — Z23 Encounter for immunization: Secondary | ICD-10-CM

## 2014-08-31 LAB — HM DIABETES EYE EXAM

## 2014-09-13 ENCOUNTER — Other Ambulatory Visit: Payer: Self-pay

## 2014-09-13 DIAGNOSIS — IMO0002 Reserved for concepts with insufficient information to code with codable children: Secondary | ICD-10-CM

## 2014-09-13 DIAGNOSIS — E1129 Type 2 diabetes mellitus with other diabetic kidney complication: Secondary | ICD-10-CM

## 2014-09-13 DIAGNOSIS — E1165 Type 2 diabetes mellitus with hyperglycemia: Principal | ICD-10-CM

## 2014-09-13 MED ORDER — PIOGLITAZONE HCL 15 MG PO TABS: 15.0000 mg | ORAL_TABLET | Freq: Every day | ORAL | Status: DC

## 2014-09-14 ENCOUNTER — Other Ambulatory Visit: Payer: Self-pay

## 2014-09-14 MED ORDER — DIAZEPAM 5 MG PO TABS: ORAL_TABLET | ORAL | Status: DC

## 2014-09-14 NOTE — Telephone Encounter (Signed)
Diazepam phoned to pharmacy

## 2014-09-14 NOTE — Telephone Encounter (Signed)
OK X1 

## 2014-10-12 ENCOUNTER — Other Ambulatory Visit: Payer: Self-pay

## 2014-10-12 MED ORDER — DIAZEPAM 5 MG PO TABS
ORAL_TABLET | ORAL | Status: DC
Start: 2014-10-12 — End: 2014-10-12

## 2014-10-12 MED ORDER — DIAZEPAM 5 MG PO TABS
ORAL_TABLET | ORAL | Status: DC
Start: 2014-10-12 — End: 2014-11-12

## 2014-10-12 NOTE — Telephone Encounter (Signed)
Done hardcopy to staff 

## 2014-10-12 NOTE — Telephone Encounter (Signed)
Script for Diazepam has been [facility]ed to [facility]

## 2014-10-12 NOTE — Telephone Encounter (Signed)
Okay for refill one time since MD is out of the office this week?

## 2014-11-12 ENCOUNTER — Other Ambulatory Visit: Payer: Self-pay | Admitting: Internal Medicine

## 2014-11-13 ENCOUNTER — Encounter: Payer: Self-pay | Admitting: Internal Medicine

## 2014-11-13 NOTE — Telephone Encounter (Signed)
OK X1 

## 2014-11-13 NOTE — Telephone Encounter (Signed)
[facility] [address]

## 2014-11-15 ENCOUNTER — Encounter: Payer: Self-pay | Admitting: Internal Medicine

## 2014-11-16 ENCOUNTER — Other Ambulatory Visit: Payer: Self-pay | Admitting: Internal Medicine

## 2014-11-16 ENCOUNTER — Other Ambulatory Visit (INDEPENDENT_AMBULATORY_CARE_PROVIDER_SITE_OTHER): Payer: Medicare Other

## 2014-11-16 DIAGNOSIS — IMO0002 Reserved for concepts with insufficient information to code with codable children: Secondary | ICD-10-CM

## 2014-11-16 DIAGNOSIS — E1129 Type 2 diabetes mellitus with other diabetic kidney complication: Secondary | ICD-10-CM

## 2014-11-16 DIAGNOSIS — E1165 Type 2 diabetes mellitus with hyperglycemia: Secondary | ICD-10-CM

## 2014-11-16 DIAGNOSIS — E782 Mixed hyperlipidemia: Secondary | ICD-10-CM

## 2014-11-16 LAB — LIPID PANEL
CHOLESTEROL: 211 mg/dL — AB (ref 0–200)
HDL: 46.3 mg/dL (ref >39.00)
NONHDL: 164.7
Total CHOL/HDL Ratio: 5
Triglycerides: 206 mg/dL — ABNORMAL HIGH (ref 0.0–149.0)
VLDL: 41.2 mg/dL — ABNORMAL HIGH (ref 0.0–40.0)

## 2014-11-16 LAB — HEPATIC FUNCTION PANEL
ALBUMIN: 4.2 g/dL (ref 3.5–5.2)
ALK PHOS: 57 U/L (ref 39–117)
ALT: 17 U/L (ref 0–35)
AST: 25 U/L (ref 0–37)
Bilirubin, Direct: 0 mg/dL (ref 0.0–0.3)
Total Bilirubin: 0.6 mg/dL (ref 0.2–1.2)
Total Protein: 7.6 g/dL (ref 6.0–8.3)

## 2014-11-16 LAB — MICROALBUMIN / CREATININE URINE RATIO
Creatinine,U: 206.6 mg/dL
Microalb Creat Ratio: 93.9 mg/g — ABNORMAL HIGH (ref 0.0–30.0)
Microalb, Ur: 194 mg/dL — ABNORMAL HIGH (ref 0.0–1.9)

## 2014-11-16 LAB — BASIC METABOLIC PANEL
BUN: 26 mg/dL — AB (ref 6–23)
CO2: 26 meq/L (ref 19–32)
Calcium: 9.9 mg/dL (ref 8.4–10.5)
Chloride: 103 mEq/L (ref 96–112)
Creatinine, Ser: 1.5 mg/dL — ABNORMAL HIGH (ref 0.4–1.2)
GFR: 36.57 mL/min — ABNORMAL LOW (ref >60.00)
Glucose, Bld: 158 mg/dL — ABNORMAL HIGH (ref 70–99)
Potassium: 3.8 mEq/L (ref 3.5–5.1)
SODIUM: 141 meq/L (ref 135–145)

## 2014-11-16 LAB — TSH: TSH: 1.95 u[IU]/mL (ref 0.35–4.50)

## 2014-11-16 LAB — HEMOGLOBIN A1C: HEMOGLOBIN A1C: 6.9 % — AB (ref 4.6–6.5)

## 2014-11-16 LAB — LDL CHOLESTEROL, DIRECT: LDL DIRECT: 113.9 mg/dL

## 2014-11-22 ENCOUNTER — Ambulatory Visit (INDEPENDENT_AMBULATORY_CARE_PROVIDER_SITE_OTHER): Payer: Medicare Other | Admitting: Internal Medicine

## 2014-11-22 ENCOUNTER — Encounter: Payer: Self-pay | Admitting: Internal Medicine

## 2014-11-22 VITALS — BP 136/60 | HR 86 | Temp 98.2°F | Ht 65.0 in | Wt 163.2 lb

## 2014-11-22 DIAGNOSIS — I10 Essential (primary) hypertension: Secondary | ICD-10-CM

## 2014-11-22 DIAGNOSIS — M4310 Spondylolisthesis, site unspecified: Secondary | ICD-10-CM

## 2014-11-22 DIAGNOSIS — E782 Mixed hyperlipidemia: Secondary | ICD-10-CM

## 2014-11-22 DIAGNOSIS — E1129 Type 2 diabetes mellitus with other diabetic kidney complication: Secondary | ICD-10-CM

## 2014-11-22 DIAGNOSIS — E1165 Type 2 diabetes mellitus with hyperglycemia: Secondary | ICD-10-CM

## 2014-11-22 DIAGNOSIS — IMO0002 Reserved for concepts with insufficient information to code with codable children: Secondary | ICD-10-CM

## 2014-11-22 NOTE — Progress Notes (Deleted)
Pre visit review using our clinic review tool, if applicable. No additional management support is needed unless otherwise documented below in the visit note. 

## 2014-11-22 NOTE — Patient Instructions (Signed)
Your next office appointment will be determined based upon review of your pending labs in 14 weeks.

## 2014-11-22 NOTE — Assessment & Plan Note (Addendum)
D/C NSAIDs due to renal issues Tramadol trial Non operative Back Specialist if no better

## 2014-11-22 NOTE — Assessment & Plan Note (Addendum)
D/C NSAIDs BMET 14 weeks Renal artery imaging if creat rises further

## 2014-11-22 NOTE — Assessment & Plan Note (Signed)
D/C fenofibrate due to 2016 cost Lipids in 14 weeks

## 2014-11-22 NOTE — Progress Notes (Signed)
   Subjective:    Patient ID: Sheila Melton, female    DOB: 1941/09/28, 74 y.o.   MRN: [id number]  HPI  She returned for follow-up of her hypertension, diabetes, dyslipidemia.  She does not add salt at the table and tries not to cook with it. She's on modified heart healthy diet. She is unable to exercise.  She has been compliant with her medications without adverse effect.  Blood pressures are the ranges of 130s over 60s.  The recent labs were reviewed with her. There has been significant improvement in her lipids but her creatinine has risen  Specifically her creatinine has risen from 1.3 up to 1.5. Her GFR has decreased from 43.65 to 36.57.  There is been dramatic improvement in her diabetes; A1c has dropped from 8.1% down to 6.9%. Despite this her urine microalbumin has increased from 95.5 up to 194.  She is on Actos 15 mg. She believes that she's had some central weight gain of 10 pounds related to this  She has been on fenofibrate but this will be a very high level tier in 2016. This will be financially prohibitive. This is unfortunate as her triglycerides dropped from 387 down to 206.  She had not informed us that she's been taking ibuprofen 2 a day.  She continues to use the E cigarette and  is not smoking cigarettes at all.  Review of Systems   Chest pain, palpitations, tachycardia, exertional dyspnea, paroxysmal nocturnal dyspnea, claudication or edema are absent.      Objective:   Physical Exam  Pertinent or positive findings include: She appears younger than stated age. She has a grade 1 systolic murmur. There is radiation of the murmur versus a right carotid bruit  Aorta is palpable. She is known to have an abdominal aneurysm but clinically this is not apparent on palpation She does have bilateral renal bruits Decreased DPP.  General appearance :adequately nourished; in no distress. Eyes: No conjunctival inflammation or scleral icterus is present. Oral exam:  Dental hygiene is good. Lips and gums are healthy appearing.There is no oropharyngeal erythema or exudate noted.  Heart:  Normal rate and regular rhythm. S1 and S2 normal without gallop, click, rub or other extra sounds   Lungs:Chest clear to auscultation; no wheezes, rhonchi,rales ,or rubs present.No increased work of breathing.  Abdomen: bowel sounds normal, soft and non-tender without masses, organomegaly or hernias noted.  No guarding or rebound.  Vascular : all pulses equal  Skin:Warm & dry.  Intact without suspicious lesions or rashes ; no jaundice or tenting Lymphatic: No lymphadenopathy is noted about the head, neck, axilla         Assessment & Plan:  See Current Assessment & Plan in Problem List under specific Diagnosis  The fenofibrate will be discontinued.  Ibuprofen risk was discussed. Tramadol was prescribed. If this is ineffective; she should see her nonoperative back specialist for additional options. It will be imperative to avoid nonsteroidals because of the renal insufficiency.  Despite the central weight gain the Actos will be continued.  Labs will be repeated in 14 weeks.  If there is continued rise in the creatinine; renal artery imaging will be pursued to rule out progressive renal artery stenosis. This was seem unlikely in view of the excellent blood pressure control.

## 2014-11-22 NOTE — Assessment & Plan Note (Signed)
A1c , urine microalbumin, BMET in 14 weeks

## 2014-11-23 ENCOUNTER — Encounter: Payer: Self-pay | Admitting: Internal Medicine

## 2014-12-12 ENCOUNTER — Other Ambulatory Visit: Payer: Self-pay

## 2014-12-12 MED ORDER — DIAZEPAM 5 MG PO TABS: 5.0000 mg | ORAL_TABLET | Freq: Every evening | ORAL | Status: DC | PRN

## 2014-12-12 NOTE — Telephone Encounter (Signed)
Med last phoned to pharmacy 11/13/14 Patient last seen 11/22/14

## 2014-12-12 NOTE — Telephone Encounter (Signed)
OK X1 

## 2014-12-12 NOTE — Telephone Encounter (Signed)
Diazepam has been called to [facility] on [address]

## 2014-12-25 ENCOUNTER — Other Ambulatory Visit: Payer: Self-pay

## 2014-12-25 MED ORDER — AMLODIPINE BESYLATE 5 MG PO TABS: 5.0000 mg | ORAL_TABLET | Freq: Two times a day (BID) | ORAL | Status: DC

## 2015-01-08 ENCOUNTER — Other Ambulatory Visit: Payer: Self-pay

## 2015-01-08 ENCOUNTER — Other Ambulatory Visit: Payer: Self-pay | Admitting: Dermatology

## 2015-01-08 DIAGNOSIS — IMO0002 Reserved for concepts with insufficient information to code with codable children: Secondary | ICD-10-CM

## 2015-01-08 DIAGNOSIS — E1129 Type 2 diabetes mellitus with other diabetic kidney complication: Secondary | ICD-10-CM

## 2015-01-08 DIAGNOSIS — E1165 Type 2 diabetes mellitus with hyperglycemia: Principal | ICD-10-CM

## 2015-01-08 MED ORDER — PIOGLITAZONE HCL 15 MG PO TABS: 15.0000 mg | ORAL_TABLET | Freq: Every day | ORAL | Status: DC

## 2015-01-09 ENCOUNTER — Encounter: Payer: Self-pay | Admitting: Internal Medicine

## 2015-01-09 DIAGNOSIS — C4490 Unspecified malignant neoplasm of skin, unspecified: Secondary | ICD-10-CM | POA: Insufficient documentation

## 2015-01-10 ENCOUNTER — Other Ambulatory Visit: Payer: Self-pay

## 2015-01-10 MED ORDER — DIAZEPAM 5 MG PO TABS: 5.0000 mg | ORAL_TABLET | Freq: Every evening | ORAL | Status: DC | PRN

## 2015-01-10 NOTE — Telephone Encounter (Signed)
[facility] [phone]

## 2015-01-10 NOTE — Telephone Encounter (Signed)
OK X1 

## 2015-01-11 DIAGNOSIS — M5136 Other intervertebral disc degeneration, lumbar region: Secondary | ICD-10-CM | POA: Insufficient documentation

## 2015-01-11 DIAGNOSIS — M51369 Other intervertebral disc degeneration, lumbar region without mention of lumbar back pain or lower extremity pain: Secondary | ICD-10-CM | POA: Insufficient documentation

## 2015-02-07 ENCOUNTER — Other Ambulatory Visit: Payer: Self-pay

## 2015-02-07 DIAGNOSIS — E1129 Type 2 diabetes mellitus with other diabetic kidney complication: Secondary | ICD-10-CM

## 2015-02-07 DIAGNOSIS — IMO0002 Reserved for concepts with insufficient information to code with codable children: Secondary | ICD-10-CM

## 2015-02-07 DIAGNOSIS — E1165 Type 2 diabetes mellitus with hyperglycemia: Principal | ICD-10-CM

## 2015-02-07 MED ORDER — PIOGLITAZONE HCL 15 MG PO TABS: 15.0000 mg | ORAL_TABLET | Freq: Every day | ORAL | Status: DC

## 2015-02-08 ENCOUNTER — Other Ambulatory Visit: Payer: Self-pay

## 2015-02-08 MED ORDER — DIAZEPAM 5 MG PO TABS: 5.0000 mg | ORAL_TABLET | Freq: Every evening | ORAL | Status: DC | PRN

## 2015-02-08 NOTE — Telephone Encounter (Signed)
OK X1 

## 2015-02-08 NOTE — Telephone Encounter (Signed)
[facility] [address]

## 2015-02-19 ENCOUNTER — Other Ambulatory Visit: Payer: Self-pay | Admitting: Internal Medicine

## 2015-02-19 ENCOUNTER — Telehealth: Payer: Self-pay | Admitting: Internal Medicine

## 2015-02-19 DIAGNOSIS — IMO0002 Reserved for concepts with insufficient information to code with codable children: Secondary | ICD-10-CM

## 2015-02-19 DIAGNOSIS — N289 Disorder of kidney and ureter, unspecified: Secondary | ICD-10-CM

## 2015-02-19 DIAGNOSIS — E1129 Type 2 diabetes mellitus with other diabetic kidney complication: Secondary | ICD-10-CM

## 2015-02-19 DIAGNOSIS — I10 Essential (primary) hypertension: Secondary | ICD-10-CM

## 2015-02-19 DIAGNOSIS — E1165 Type 2 diabetes mellitus with hyperglycemia: Principal | ICD-10-CM

## 2015-02-19 NOTE — Telephone Encounter (Signed)
Patient has been advised lab orders are placed and to schedule an appointment 2-3 days after labs are done.

## 2015-02-19 NOTE — Telephone Encounter (Signed)
On her last visit Dr. Linna Darner wanted her to come back for more blood work in 14 weeks. She will need some orders for the lab please. Please call her

## 2015-02-19 NOTE — Telephone Encounter (Signed)
Orders entered OV 2-3 days later

## 2015-02-19 NOTE — Telephone Encounter (Signed)
Dr Linna Darner,   What labs is patient to have done?

## 2015-02-23 ENCOUNTER — Other Ambulatory Visit (INDEPENDENT_AMBULATORY_CARE_PROVIDER_SITE_OTHER): Payer: Medicare Other

## 2015-02-23 DIAGNOSIS — I10 Essential (primary) hypertension: Secondary | ICD-10-CM | POA: Diagnosis not present

## 2015-02-23 DIAGNOSIS — E1129 Type 2 diabetes mellitus with other diabetic kidney complication: Secondary | ICD-10-CM

## 2015-02-23 DIAGNOSIS — E1165 Type 2 diabetes mellitus with hyperglycemia: Secondary | ICD-10-CM

## 2015-02-23 DIAGNOSIS — N289 Disorder of kidney and ureter, unspecified: Secondary | ICD-10-CM | POA: Diagnosis not present

## 2015-02-23 DIAGNOSIS — IMO0002 Reserved for concepts with insufficient information to code with codable children: Secondary | ICD-10-CM

## 2015-02-23 LAB — HEMOGLOBIN A1C: HEMOGLOBIN A1C: 6.5 % (ref 4.6–6.5)

## 2015-02-23 LAB — BASIC METABOLIC PANEL
BUN: 19 mg/dL (ref 6–23)
CALCIUM: 9.6 mg/dL (ref 8.4–10.5)
CO2: 27 mEq/L (ref 19–32)
CREATININE: 1 mg/dL (ref 0.40–1.20)
Chloride: 102 mEq/L (ref 96–112)
GFR: 57.9 mL/min — AB (ref >60.00)
Glucose, Bld: 149 mg/dL — ABNORMAL HIGH (ref 70–99)
Potassium: 3.8 mEq/L (ref 3.5–5.1)
Sodium: 137 mEq/L (ref 135–145)

## 2015-03-06 ENCOUNTER — Telehealth: Payer: Self-pay | Admitting: *Deleted

## 2015-03-06 NOTE — Telephone Encounter (Signed)
Mrs. Beveridge called yesterday c/o 1 episode of LUQ pain and also back pain. She is afebrile and has no nausea or vomiting. She recently saw her PCP, Dr. Linna Darner, and her orthopedist, Dr. Trenton Gammon. Dr.Poole ordered an MRI to follow up on her back pain; she has had spine surgery. She will see him again on 03-15-15. When I called her back this morning, she is much better and the pain has not recurred.  Patient is a diabetic and she states that her blood sugars are stable. This LUQ pain only lasted a couple of hours yesterday and she has already made an appt to see Dr. Linna Darner to rule out UTI /kidney issues or pancreatic problems. Her AAA measured 3.57cm at our last appt. She will follow up with her other doctors and call us back if she should have any other issues. Patient voiced understanding and agreement with this plan.

## 2015-03-08 ENCOUNTER — Other Ambulatory Visit: Payer: Self-pay | Admitting: Internal Medicine

## 2015-03-08 NOTE — Telephone Encounter (Signed)
OK X1 

## 2015-03-08 NOTE — Telephone Encounter (Signed)
[facility] [address]

## 2015-03-15 DIAGNOSIS — M47817 Spondylosis without myelopathy or radiculopathy, lumbosacral region: Secondary | ICD-10-CM | POA: Insufficient documentation

## 2015-03-19 ENCOUNTER — Telehealth: Payer: Self-pay | Admitting: Gastroenterology

## 2015-03-19 ENCOUNTER — Emergency Department (HOSPITAL_COMMUNITY): Payer: Medicare Other

## 2015-03-19 ENCOUNTER — Inpatient Hospital Stay (HOSPITAL_COMMUNITY)
Admission: EM | Admit: 2015-03-19 | Discharge: 2015-03-21 | DRG: 392 | Disposition: A | Discharge status: Home / Self Care | Payer: Medicare Other | Attending: Internal Medicine | Admitting: Internal Medicine

## 2015-03-19 ENCOUNTER — Encounter (HOSPITAL_COMMUNITY): Payer: Self-pay | Admitting: Emergency Medicine

## 2015-03-19 DIAGNOSIS — E119 Type 2 diabetes mellitus without complications: Secondary | ICD-10-CM | POA: Diagnosis present

## 2015-03-19 DIAGNOSIS — K529 Noninfective gastroenteritis and colitis, unspecified: Secondary | ICD-10-CM | POA: Diagnosis not present

## 2015-03-19 DIAGNOSIS — F329 Major depressive disorder, single episode, unspecified: Secondary | ICD-10-CM | POA: Diagnosis present

## 2015-03-19 DIAGNOSIS — N179 Acute kidney failure, unspecified: Secondary | ICD-10-CM | POA: Diagnosis present

## 2015-03-19 DIAGNOSIS — Z9849 Cataract extraction status, unspecified eye: Secondary | ICD-10-CM

## 2015-03-19 DIAGNOSIS — I714 Abdominal aortic aneurysm, without rupture, unspecified: Secondary | ICD-10-CM

## 2015-03-19 DIAGNOSIS — I10 Essential (primary) hypertension: Secondary | ICD-10-CM | POA: Diagnosis present

## 2015-03-19 DIAGNOSIS — F411 Generalized anxiety disorder: Secondary | ICD-10-CM | POA: Diagnosis present

## 2015-03-19 DIAGNOSIS — Z90710 Acquired absence of both cervix and uterus: Secondary | ICD-10-CM | POA: Diagnosis not present

## 2015-03-19 DIAGNOSIS — A09 Infectious gastroenteritis and colitis, unspecified: Secondary | ICD-10-CM | POA: Diagnosis present

## 2015-03-19 DIAGNOSIS — Z888 Allergy status to other drugs, medicaments and biological substances status: Secondary | ICD-10-CM

## 2015-03-19 DIAGNOSIS — N200 Calculus of kidney: Secondary | ICD-10-CM | POA: Diagnosis present

## 2015-03-19 DIAGNOSIS — M1990 Unspecified osteoarthritis, unspecified site: Secondary | ICD-10-CM | POA: Diagnosis present

## 2015-03-19 DIAGNOSIS — E785 Hyperlipidemia, unspecified: Secondary | ICD-10-CM | POA: Diagnosis present

## 2015-03-19 DIAGNOSIS — I517 Cardiomegaly: Secondary | ICD-10-CM | POA: Diagnosis present

## 2015-03-19 DIAGNOSIS — Z881 Allergy status to other antibiotic agents status: Secondary | ICD-10-CM

## 2015-03-19 DIAGNOSIS — F17210 Nicotine dependence, cigarettes, uncomplicated: Secondary | ICD-10-CM | POA: Diagnosis present

## 2015-03-19 DIAGNOSIS — R197 Diarrhea, unspecified: Secondary | ICD-10-CM

## 2015-03-19 DIAGNOSIS — R1032 Left lower quadrant pain: Secondary | ICD-10-CM | POA: Diagnosis present

## 2015-03-19 LAB — COMPREHENSIVE METABOLIC PANEL WITH GFR
ALT: 16 U/L (ref 14–54)
AST: 21 U/L (ref 15–41)
Albumin: 3.8 g/dL (ref 3.5–5.0)
Alkaline Phosphatase: 83 U/L (ref 38–126)
Anion gap: 12 (ref 5–15)
BUN: 16 mg/dL (ref 6–20)
CO2: 25 mmol/L (ref 22–32)
Calcium: 9.5 mg/dL (ref 8.9–10.3)
Chloride: 99 mmol/L — ABNORMAL LOW (ref 101–111)
Creatinine, Ser: 1.17 mg/dL — ABNORMAL HIGH (ref 0.44–1.00)
GFR calc Af Amer: 53 mL/min — ABNORMAL LOW
GFR calc non Af Amer: 45 mL/min — ABNORMAL LOW
Glucose, Bld: 217 mg/dL — ABNORMAL HIGH (ref 70–99)
Potassium: 3.6 mmol/L (ref 3.5–5.1)
Sodium: 136 mmol/L (ref 135–145)
Total Bilirubin: 0.5 mg/dL (ref 0.3–1.2)
Total Protein: 7.2 g/dL (ref 6.5–8.1)

## 2015-03-19 LAB — CBC WITH DIFFERENTIAL/PLATELET
BASOS ABS: 0 10*3/uL (ref 0.0–0.1)
Basophils Relative: 0 % (ref 0–1)
Eosinophils Absolute: 0.6 10*3/uL (ref 0.0–0.7)
Eosinophils Relative: 4 % (ref 0–5)
HEMATOCRIT: 38.5 % (ref 36.0–46.0)
Hemoglobin: 12.4 g/dL (ref 12.0–15.0)
LYMPHS ABS: 2.5 10*3/uL (ref 0.7–4.0)
LYMPHS PCT: 16 % (ref 12–46)
MCH: 29.4 pg (ref 26.0–34.0)
MCHC: 32.2 g/dL (ref 30.0–36.0)
MCV: 91.2 fL (ref 78.0–100.0)
MONO ABS: 1.4 10*3/uL — AB (ref 0.1–1.0)
Monocytes Relative: 9 % (ref 3–12)
Neutro Abs: 11.2 10*3/uL — ABNORMAL HIGH (ref 1.7–7.7)
Neutrophils Relative %: 71 % (ref 43–77)
Platelets: 304 10*3/uL (ref 150–400)
RBC: 4.22 MIL/uL (ref 3.87–5.11)
RDW: 13.9 % (ref 11.5–15.5)
WBC: 15.7 10*3/uL — AB (ref 4.0–10.5)

## 2015-03-19 LAB — URINALYSIS, ROUTINE W REFLEX MICROSCOPIC
BILIRUBIN URINE: NEGATIVE
Glucose, UA: NEGATIVE mg/dL
HGB URINE DIPSTICK: NEGATIVE
Ketones, ur: NEGATIVE mg/dL
Nitrite: NEGATIVE
PH: 6 (ref 5.0–8.0)
Protein, ur: >300 mg/dL — AB
Specific Gravity, Urine: 1.023 (ref 1.005–1.030)
Urobilinogen, UA: 1 mg/dL (ref 0.0–1.0)

## 2015-03-19 LAB — I-STAT CG4 LACTIC ACID, ED
LACTIC ACID, VENOUS: 1.48 mmol/L (ref 0.5–2.0)
Lactic Acid, Venous: 1.78 mmol/L (ref 0.5–2.0)

## 2015-03-19 LAB — URINE MICROSCOPIC-ADD ON

## 2015-03-19 LAB — LIPASE, BLOOD: Lipase: 47 U/L (ref 22–51)

## 2015-03-19 LAB — GLUCOSE, CAPILLARY: Glucose-Capillary: 187 mg/dL — ABNORMAL HIGH (ref 70–99)

## 2015-03-19 MED ORDER — LORATADINE 10 MG PO TABS
10.0000 mg | ORAL_TABLET | Freq: Every day | ORAL | Status: DC
  Administered 2015-03-19 – 2015-03-20 (×2): 10 mg via ORAL
  Filled 2015-03-19 (×3): qty 1

## 2015-03-19 MED ORDER — SODIUM CHLORIDE 0.9 % IV SOLN
INTRAVENOUS | Status: DC
  Administered 2015-03-19: 17:00:00 via INTRAVENOUS

## 2015-03-19 MED ORDER — ONDANSETRON HCL 4 MG/2ML IJ SOLN: 4.0000 mg | Freq: Four times a day (QID) | INTRAMUSCULAR | Status: DC | PRN

## 2015-03-19 MED ORDER — PRAVASTATIN SODIUM 40 MG PO TABS
40.0000 mg | ORAL_TABLET | Freq: Every day | ORAL | Status: DC
  Administered 2015-03-20: 40 mg via ORAL
  Filled 2015-03-19 (×2): qty 1

## 2015-03-19 MED ORDER — ONDANSETRON HCL 4 MG PO TABS: 4.0000 mg | ORAL_TABLET | Freq: Four times a day (QID) | ORAL | Status: DC | PRN

## 2015-03-19 MED ORDER — METRONIDAZOLE IN NACL 5-0.79 MG/ML-% IV SOLN
500.0000 mg | Freq: Three times a day (TID) | INTRAVENOUS | Status: DC
  Administered 2015-03-20 – 2015-03-21 (×4): 500 mg via INTRAVENOUS
  Filled 2015-03-19 (×5): qty 100

## 2015-03-19 MED ORDER — ACETAMINOPHEN 650 MG RE SUPP
650.0000 mg | Freq: Four times a day (QID) | RECTAL | Status: DC | PRN
Start: 2015-03-19 — End: 2015-03-21

## 2015-03-19 MED ORDER — HEPARIN SODIUM (PORCINE) 5000 UNIT/ML IJ SOLN
5000.0000 [IU] | Freq: Three times a day (TID) | INTRAMUSCULAR | Status: DC
  Administered 2015-03-19 – 2015-03-21 (×5): 5000 [IU] via SUBCUTANEOUS
  Filled 2015-03-19 (×8): qty 1

## 2015-03-19 MED ORDER — LABETALOL HCL 300 MG PO TABS
300.0000 mg | ORAL_TABLET | Freq: Two times a day (BID) | ORAL | Status: DC
  Administered 2015-03-19 – 2015-03-21 (×3): 300 mg via ORAL
  Filled 2015-03-19 (×5): qty 1

## 2015-03-19 MED ORDER — METRONIDAZOLE IN NACL 5-0.79 MG/ML-% IV SOLN
500.0000 mg | Freq: Once | INTRAVENOUS | Status: DC
  Filled 2015-03-19: qty 100

## 2015-03-19 MED ORDER — MENTHOL 10 % EX AERO: INHALATION_SPRAY | Freq: Two times a day (BID) | CUTANEOUS | Status: DC | PRN

## 2015-03-19 MED ORDER — SODIUM CHLORIDE 0.9 % IV SOLN
INTRAVENOUS | Status: DC
  Administered 2015-03-19: 20:00:00 via INTRAVENOUS
  Administered 2015-03-20: 125 mL/h via INTRAVENOUS
  Administered 2015-03-20 – 2015-03-21 (×2): via INTRAVENOUS

## 2015-03-19 MED ORDER — DIAZEPAM 5 MG PO TABS: 5.0000 mg | ORAL_TABLET | Freq: Every evening | ORAL | Status: DC | PRN

## 2015-03-19 MED ORDER — IRBESARTAN 300 MG PO TABS
300.0000 mg | ORAL_TABLET | Freq: Every day | ORAL | Status: DC
  Administered 2015-03-19: 300 mg via ORAL
  Filled 2015-03-19 (×2): qty 1

## 2015-03-19 MED ORDER — SALONPAS EX PADS: 1.0000 | MEDICATED_PAD | Freq: Three times a day (TID) | CUTANEOUS | Status: DC | PRN

## 2015-03-19 MED ORDER — NICOTINE 21 MG/24HR TD PT24
21.0000 mg | MEDICATED_PATCH | Freq: Every day | TRANSDERMAL | Status: DC
  Administered 2015-03-19 – 2015-03-20 (×2): 21 mg via TRANSDERMAL
  Filled 2015-03-19 (×3): qty 1

## 2015-03-19 MED ORDER — INSULIN ASPART 100 UNIT/ML ~~LOC~~ SOLN
0.0000 [IU] | Freq: Three times a day (TID) | SUBCUTANEOUS | Status: DC
  Administered 2015-03-19: 2 [IU] via SUBCUTANEOUS
  Administered 2015-03-20 (×2): 1 [IU] via SUBCUTANEOUS
  Administered 2015-03-21: 2 [IU] via SUBCUTANEOUS

## 2015-03-19 MED ORDER — SODIUM CHLORIDE 0.9 % IV BOLUS (SEPSIS)
1000.0000 mL | Freq: Once | INTRAVENOUS | Status: AC
  Administered 2015-03-19: 1000 mL via INTRAVENOUS

## 2015-03-19 MED ORDER — CIPROFLOXACIN IN D5W 400 MG/200ML IV SOLN
400.0000 mg | Freq: Two times a day (BID) | INTRAVENOUS | Status: DC
  Administered 2015-03-20 – 2015-03-21 (×3): 400 mg via INTRAVENOUS
  Filled 2015-03-19 (×3): qty 200

## 2015-03-19 MED ORDER — PIOGLITAZONE HCL 15 MG PO TABS
15.0000 mg | ORAL_TABLET | Freq: Every day | ORAL | Status: DC
  Administered 2015-03-20: 15 mg via ORAL
  Filled 2015-03-19 (×2): qty 1

## 2015-03-19 MED ORDER — CIPROFLOXACIN IN D5W 400 MG/200ML IV SOLN
400.0000 mg | Freq: Once | INTRAVENOUS | Status: DC
Start: 2015-03-19 — End: 2015-03-19
  Filled 2015-03-19: qty 200

## 2015-03-19 MED ORDER — AMLODIPINE BESYLATE 5 MG PO TABS
5.0000 mg | ORAL_TABLET | Freq: Two times a day (BID) | ORAL | Status: DC
  Administered 2015-03-19 – 2015-03-21 (×3): 5 mg via ORAL
  Filled 2015-03-19 (×5): qty 1

## 2015-03-19 MED ORDER — ACETAMINOPHEN 325 MG PO TABS: 650.0000 mg | ORAL_TABLET | Freq: Four times a day (QID) | ORAL | Status: DC | PRN

## 2015-03-19 MED ORDER — OXYCODONE HCL 5 MG PO TABS: 5.0000 mg | ORAL_TABLET | ORAL | Status: DC | PRN

## 2015-03-19 MED ORDER — CIPROFLOXACIN IN D5W 400 MG/200ML IV SOLN
400.0000 mg | Freq: Once | INTRAVENOUS | Status: AC
  Administered 2015-03-19: 400 mg via INTRAVENOUS
  Filled 2015-03-19: qty 200

## 2015-03-19 MED ORDER — METRONIDAZOLE IN NACL 5-0.79 MG/ML-% IV SOLN
500.0000 mg | Freq: Once | INTRAVENOUS | Status: AC
  Administered 2015-03-19: 500 mg via INTRAVENOUS
  Filled 2015-03-19: qty 100

## 2015-03-19 MED ORDER — IOHEXOL 300 MG/ML  SOLN
100.0000 mL | Freq: Once | INTRAMUSCULAR | Status: AC | PRN
  Administered 2015-03-19: 100 mL via INTRAVENOUS

## 2015-03-19 MED ORDER — MUSCLE RUB 10-15 % EX CREA
TOPICAL_CREAM | Freq: Three times a day (TID) | CUTANEOUS | Status: DC | PRN
  Filled 2015-03-19: qty 85

## 2015-03-19 MED ORDER — INSULIN ASPART 100 UNIT/ML ~~LOC~~ SOLN: 0.0000 [IU] | Freq: Three times a day (TID) | SUBCUTANEOUS | Status: DC

## 2015-03-19 NOTE — ED Notes (Signed)
Bed: [id number] Expected date:  Expected time:  Means of arrival:  Comments: [place]- Lactic 1.78

## 2015-03-19 NOTE — ED Provider Notes (Signed)
CSN: [id number]     Arrival date & time 03/19/15  1203 History   First MD Initiated Contact with Patient 03/19/15 1502     Chief Complaint  Patient presents with  . Abdominal Pain  . Diarrhea     (Consider location/radiation/quality/duration/timing/severity/associated sxs/prior Treatment) HPI Comments: Sx started after having mri 3 weeks ago with watery diarrhea and diffuse abd pain--used lomotil with some relief--no fever, emesis, black stools--now with worsening weakness worse with standing  Patient is a 74 y.o. female presenting with abdominal pain and diarrhea. The history is provided by the patient.  Abdominal Pain Pain location:  Generalized Pain quality: aching   Pain radiates to:  Does not radiate Pain severity:  Mild Onset quality:  Gradual Duration:  3 weeks Timing:  Constant Progression:  Worsening Chronicity:  New Relieved by:  Nothing Worsened by:  Nothing tried Associated symptoms: diarrhea   Diarrhea Associated symptoms: abdominal pain     Past Medical History  Diagnosis Date  . Hyperlipidemia   . Diabetes mellitus     2  . Hypertension     unspecified essential  . Family history of anesthesia complication     PATIENTS MOM HAD TROUBLE WAKING UP   . Heart murmur   . Subclavian steal syndrome   . Subclavian steal syndrome     LEFT SIDE   NO SIDE EFFECTS  . AAA (abdominal aortic aneurysm)     BEING WATCHED   [facility]   . Arthritis   . CAP (community acquired pneumonia) 10/24-27/14     with SIRS  . UTI (lower urinary tract infection) 08/26/13    Enterococcus and Escherichia coli both sensitive to nitrofurantoin   Past Surgical History  Procedure Laterality Date  . Cataract extraction      OS  . G1 p1    . Abdominal hysterectomy    . Dilation and curettage of uterus    . Cyst excision Right 05-23-13    Right index finger: cyst  . Spine surgery  Sept. 2013  . Eye surgery     Family History  Problem Relation Age of Onset  . Coronary artery disease     . Diabetes    . Heart failure Mother   . Heart disease Mother   . Hypertension Mother   . Other Mother     varicose veins  . Deep vein thrombosis Mother   . Varicose Veins Mother   . Heart attack Mother   . Hypertension Sister     X 2  . Diabetes Sister   . Hyperlipidemia Sister   . Other Sister     varicose veins  . Heart disease Father   . Diabetes Father   . Hyperlipidemia Father   . Hypertension Father   . Heart attack Father   . Heart disease Brother     MI in late 49s  . Hyperlipidemia Brother   . Heart attack Brother   . Parkinsonism Brother   . Heart failure Brother   . Hypertension Daughter    History  Substance Use Topics  . Smoking status: Light Tobacco Smoker -- 1.00 packs/day for 50 years    Types: E-cigarettes  . Smokeless tobacco: Never Used  . Alcohol Use: No   OB History    No data available     Review of Systems  Gastrointestinal: Positive for abdominal pain and diarrhea.  All other systems reviewed and are negative.     Allergies  Amoxicillin; Rocephin; Captopril; Simvastatin; Tramadol;  and Adhesive  Home Medications   Prior to Admission medications   Medication Sig Start Date End Date Taking? Authorizing Provider  acetaminophen (TYLENOL) 500 MG tablet Take 500 mg by mouth every 6 (six) hours as needed for pain.   Yes Historical Provider, MD  amLODipine (NORVASC) 5 MG tablet Take 1 tablet (5 mg total) by mouth 2 (two) times daily. 12/25/14  Yes Hendricks Limes, MD  azelastine (ASTELIN) 137 MCG/SPRAY nasal spray PLACE 1 SPRAY INTO THE NOSE 2 (TWO) TIMES DAILY. USE IN EACH NOSTRIL AS DIRECTED Patient taking differently: PLACE 1 SPRAY INTO THE NOSE 2 (TWO) TIMES DAILY as needed for allergies 11/14/13  Yes Midge Minium, MD  B Complex-C (SUPER B COMPLEX PO) Take 1 tablet by mouth daily.    Yes Historical Provider, MD  Cholecalciferol (VITAMIN D3) 2000 UNITS TABS Take 2,000 Units by mouth daily.    Yes Historical Provider, MD  diazepam  (VALIUM) 5 MG tablet TAKE 1 TABLET BY MOUTH AT BEDTIME AS NEEDED Patient taking differently: TAKE 1 TABLET BY MOUTH AT BEDTIME AS NEEDED for sleep/anxiety 03/08/15  Yes Hendricks Limes, MD  DimenhyDRINATE 50 MG CHEW Chew 1 tablet by mouth as needed (nausea/vommitting).   Yes Historical Provider, MD  fexofenadine (ALLEGRA) 180 MG tablet Take 180 mg by mouth at bedtime.    Yes Historical Provider, MD  glucose blood (ONE TOUCH ULTRA TEST) test strip CHECK BLOOD SUGAR ONCE DAILY Dx 250.42 07/31/14  Yes Hendricks Limes, MD  ibuprofen (ADVIL,MOTRIN) 200 MG tablet Take 400 mg by mouth daily.    Yes Historical Provider, MD  labetalol (NORMODYNE) 300 MG tablet Take 1 tablet (300 mg total) by mouth 2 (two) times daily. 04/17/14  Yes Hendricks Limes, MD  [facility]) PADS Apply 1 each topically every 8 (eight) hours as needed (pain).   Yes Historical Provider, MD  Loperamide HCl (IMODIUM PO) Take 1 tablet by mouth as needed (for loose stools).   Yes Historical Provider, MD  Menthol, Topical Analgesic, (BIOFREEZE EX) Apply 1 application topically 2 (two) times daily as needed (back pain).   Yes Historical Provider, MD  pioglitazone (ACTOS) 15 MG tablet Take 1 tablet (15 mg total) by mouth daily. 02/07/15  Yes Hendricks Limes, MD  pravastatin (PRAVACHOL) 40 MG tablet 1 by mouth daily 04/17/14  Yes Hendricks Limes, MD  valsartan (DIOVAN) 320 MG tablet Take 1 tablet (320 mg total) by mouth at bedtime. 05/15/14  Yes Hendricks Limes, MD  vitamin E 400 UNIT capsule Take 400 Units by mouth daily.   Yes Historical Provider, MD   BP 137/57 mmHg  Pulse 81  Temp(Src) 98 F (36.7 C) (Oral)  Resp 16  SpO2 100% Physical Exam  Constitutional: She is oriented to person, place, and time. She appears well-developed and well-nourished.  Non-toxic appearance. No distress.  HENT:  Head: Normocephalic and atraumatic.  Eyes: Conjunctivae, EOM and lids are normal. Pupils are equal, round, and reactive to light.  Neck:  Normal range of motion. Neck supple. No tracheal deviation present. No thyroid mass present.  Cardiovascular: Normal rate, regular rhythm and normal heart sounds.  Exam reveals no gallop.   No murmur heard. Pulmonary/Chest: Effort normal and breath sounds normal. No stridor. No respiratory distress. She has no decreased breath sounds. She has no wheezes. She has no rhonchi. She has no rales.  Abdominal: Soft. Normal appearance and bowel sounds are normal. She exhibits no distension. There is generalized tenderness. There  is no rebound and no CVA tenderness.  Musculoskeletal: Normal range of motion. She exhibits no edema or tenderness.  Neurological: She is alert and oriented to person, place, and time. She has normal strength. No cranial nerve deficit or sensory deficit. GCS eye subscore is 4. GCS verbal subscore is 5. GCS motor subscore is 6.  Skin: Skin is warm and dry. No abrasion and no rash noted.  Psychiatric: She has a normal mood and affect. Her speech is normal and behavior is normal.  Nursing note and vitals reviewed.   ED Course  Procedures (including critical care time) Labs Review Labs Reviewed  CBC WITH DIFFERENTIAL/PLATELET - Abnormal; Notable for the following:    WBC 15.7 (*)    Neutro Abs 11.2 (*)    Monocytes Absolute 1.4 (*)    All other components within normal limits  COMPREHENSIVE METABOLIC PANEL - Abnormal; Notable for the following:    Chloride 99 (*)    Glucose, Bld 217 (*)    Creatinine, Ser 1.17 (*)    GFR calc non Af Amer 45 (*)    GFR calc Af Amer 53 (*)    All other components within normal limits  URINALYSIS, ROUTINE W REFLEX MICROSCOPIC - Abnormal; Notable for the following:    Color, Urine AMBER (*)    APPearance CLOUDY (*)    Protein, ur >300 (*)    Leukocytes, UA TRACE (*)    All other components within normal limits  URINE MICROSCOPIC-ADD ON - Abnormal; Notable for the following:    Bacteria, UA MANY (*)    Casts HYALINE CASTS (*)    All other  components within normal limits  LIPASE, BLOOD  I-STAT CG4 LACTIC ACID, ED  I-STAT CG4 LACTIC ACID, ED    Imaging Review No results found.   EKG Interpretation None      MDM   Final diagnoses:  Diarrhea   Patient started on ciprofloxacin and Flagyl for colitis and will be admitted by the hospitalist service    Lacretia Leigh, MD 03/19/15 [phone]

## 2015-03-19 NOTE — ED Notes (Signed)
Pt c/o abdominal pain and diarrhea x3 weeks. Has had four Imodium and one dramamine. Had back surgery 3 years ago and is having back pain again. Had MRI 3 weeks ago-had to use contrast dye. I've never had the dye. Pt says, "the next morning my stomach on the left and right side was hurting so badly. I waited about three days before calling. I was given Julian Hy took one in the morning and one at night for three days. That made my stomach hurt too. Then I started getting mouth ulcers too. I went back to taking Ibuprofen. I called Dr Irven [place] office to see if they had time to read my MRI and see if they saw anything which was causing my stomach pain. I also have an aortic aneurysm. I get it checked every year to make sure it's ok. They told me they didn't think it could have grown enough by now. I've also been told I have kidney stones and a cyst on my kidneys. They told me it was nothing to worry about." In NAD. RR even/unlabored.

## 2015-03-19 NOTE — Telephone Encounter (Signed)
Left and right side abd pain getting worse, diarrhea, she has a history of kidney stones and had MRI 3 weeks ago and states the abd pain started after the contrast. She also has a rash on her mouth.  I advised her to call her PCP and be evaluated and if they want her to be seen by GI we could certainly get her an appt.  She thanked me and will call PCP

## 2015-03-19 NOTE — ED Notes (Signed)
Notified Triage RN about Latic result of 1.78

## 2015-03-19 NOTE — H&P (Signed)
[facility] History and Physical  TAHIA MACNEAL [id number] DOB: 05-11-1941 DOA: 03/19/2015  Referring physician: Dr. Real Cons PCP: Unice Cobble, MD   Chief Complaint: Abd pain  HPI: RUTHANNA CLARIN is a 74 y.o. female  Abdominal pain. Started 3 weeks ago. No bloody. Abdominal plane is described as diffuse. Lomotil without improvement. Gradual onset, initially comes and goes but now constant. Getting worse. No change in appetite. Denies aggravating factors. Denies recent antibiotic use.  Patient has changed out her smoking for an e-cigarette. Prior to this patient was smoking 1.5 packs per day.   Review of Systems:  Constitutional:  No weight loss, night sweats, Fevers, chills, fatigue.  HEENT:  No headaches, Difficulty swallowing,Tooth/dental problems,Sore throat,  No sneezing, itching, ear ache, nasal congestion, post nasal drip,  Cardio-vascular:  No chest pain, Orthopnea, PND, swelling in lower extremities, anasarca, dizziness, palpitations  GI: per HPI Resp:   No shortness of breath with exertion or at rest. No excess mucus, no productive cough, No non-productive cough, No coughing up of blood.No change in color of mucus.No wheezing.No chest wall deformity  Skin:  no rash or lesions.  GU:  no dysuria, change in color of urine, no urgency or frequency. No flank pain.  Musculoskeletal:   No joint pain or swelling. No decreased range of motion. No back pain.  Psych:  No change in mood or affect. No depression or anxiety. No memory loss.   Past Medical History  Diagnosis Date  . Hyperlipidemia   . Diabetes mellitus     2  . Hypertension     unspecified essential  . Family history of anesthesia complication     PATIENTS MOM HAD TROUBLE WAKING UP   . Heart murmur   . Subclavian steal syndrome   . Subclavian steal syndrome     LEFT SIDE   NO SIDE EFFECTS  . AAA (abdominal aortic aneurysm)     BEING WATCHED   [facility]   . Arthritis   . CAP (community acquired  pneumonia) 10/24-27/14     with SIRS  . UTI (lower urinary tract infection) 08/26/13    Enterococcus and Escherichia coli both sensitive to nitrofurantoin   Past Surgical History  Procedure Laterality Date  . Cataract extraction      OS  . G1 p1    . Abdominal hysterectomy    . Dilation and curettage of uterus    . Cyst excision Right 05-23-13    Right index finger: cyst  . Spine surgery  Sept. 2013  . Eye surgery     Social History:  reports that she has been smoking E-cigarettes.  She has a 50 pack-year smoking history. She has never used smokeless tobacco. She reports that she does not drink alcohol or use illicit drugs.  Allergies  Allergen Reactions  . Amoxicillin     REACTION: rash  . Rocephin [Ceftriaxone Sodium In Dextrose]     10/24 /14 blisters of palms & diffuse rash Because of a history of documented adverse serious drug reaction;Medi Alert bracelet  is recommended  . Captopril     REACTION: cough  . Simvastatin     REACTION: buttocks cramps  . Tramadol Other (See Comments)    Stomach cramps, mouth sores  . Adhesive [Tape] Rash    Family History  Problem Relation Age of Onset  . Coronary artery disease    . Diabetes    . Heart failure Mother   . Heart disease Mother   .  Hypertension Mother   . Other Mother     varicose veins  . Deep vein thrombosis Mother   . Varicose Veins Mother   . Heart attack Mother   . Hypertension Sister     X 2  . Diabetes Sister   . Hyperlipidemia Sister   . Other Sister     varicose veins  . Heart disease Father   . Diabetes Father   . Hyperlipidemia Father   . Hypertension Father   . Heart attack Father   . Heart disease Brother     MI in late 72s  . Hyperlipidemia Brother   . Heart attack Brother   . Parkinsonism Brother   . Heart failure Brother   . Hypertension Daughter      Prior to Admission medications   Medication Sig Start Date End Date Taking? Authorizing Provider  acetaminophen (TYLENOL) 500 MG  tablet Take 500 mg by mouth every 6 (six) hours as needed for pain.   Yes Historical Provider, MD  amLODipine (NORVASC) 5 MG tablet Take 1 tablet (5 mg total) by mouth 2 (two) times daily. 12/25/14  Yes Hendricks Limes, MD  azelastine (ASTELIN) 137 MCG/SPRAY nasal spray PLACE 1 SPRAY INTO THE NOSE 2 (TWO) TIMES DAILY. USE IN EACH NOSTRIL AS DIRECTED Patient taking differently: PLACE 1 SPRAY INTO THE NOSE 2 (TWO) TIMES DAILY as needed for allergies 11/14/13  Yes Midge Minium, MD  B Complex-C (SUPER B COMPLEX PO) Take 1 tablet by mouth daily.    Yes Historical Provider, MD  Cholecalciferol (VITAMIN D3) 2000 UNITS TABS Take 2,000 Units by mouth daily.    Yes Historical Provider, MD  diazepam (VALIUM) 5 MG tablet TAKE 1 TABLET BY MOUTH AT BEDTIME AS NEEDED Patient taking differently: TAKE 1 TABLET BY MOUTH AT BEDTIME AS NEEDED for sleep/anxiety 03/08/15  Yes Hendricks Limes, MD  DimenhyDRINATE 50 MG CHEW Chew 1 tablet by mouth as needed (nausea/vommitting).   Yes Historical Provider, MD  fexofenadine (ALLEGRA) 180 MG tablet Take 180 mg by mouth at bedtime.    Yes Historical Provider, MD  glucose blood (ONE TOUCH ULTRA TEST) test strip CHECK BLOOD SUGAR ONCE DAILY Dx 250.42 07/31/14  Yes Hendricks Limes, MD  ibuprofen (ADVIL,MOTRIN) 200 MG tablet Take 400 mg by mouth daily.    Yes Historical Provider, MD  labetalol (NORMODYNE) 300 MG tablet Take 1 tablet (300 mg total) by mouth 2 (two) times daily. 04/17/14  Yes Hendricks Limes, MD  Liniments [facility]) PADS Apply 1 each topically every 8 (eight) hours as needed (pain).   Yes Historical Provider, MD  Loperamide HCl (IMODIUM PO) Take 1 tablet by mouth as needed (for loose stools).   Yes Historical Provider, MD  Menthol, Topical Analgesic, (BIOFREEZE EX) Apply 1 application topically 2 (two) times daily as needed (back pain).   Yes Historical Provider, MD  pioglitazone (ACTOS) 15 MG tablet Take 1 tablet (15 mg total) by mouth daily. 02/07/15  Yes Hendricks Limes, MD  pravastatin (PRAVACHOL) 40 MG tablet 1 by mouth daily 04/17/14  Yes Hendricks Limes, MD  valsartan (DIOVAN) 320 MG tablet Take 1 tablet (320 mg total) by mouth at bedtime. 05/15/14  Yes Hendricks Limes, MD  vitamin E 400 UNIT capsule Take 400 Units by mouth daily.   Yes Historical Provider, MD   Physical Exam: Filed Vitals:   03/19/15 1218 03/19/15 1622 03/19/15 1910 03/19/15 1946  BP: 137/57 158/60 153/64 143/56  Pulse: 81  87 106 101  Temp: 98 F (36.7 C)   98.2 F (36.8 C)  TempSrc: Oral   Oral  Resp: 16 15 16 17   SpO2: 100% 94% 91% 93%    Wt Readings from Last 3 Encounters:  11/22/14 74.05 kg (163 lb 4 oz)  06/20/14 71.215 kg (157 lb)  05/24/14 72.213 kg (159 lb 3.2 oz)    General:  Appears calm and comfortable Eyes:  PERRL, normal lids, irises & conjunctiva ENT:  grossly normal hearing, lips & tongue Neck:  no LAD, masses or thyromegaly Cardiovascular:  RRR, no m/r/g. No LE edema. Telemetry:  SR, no arrhythmias  Respiratory:  CTA bilaterally, no w/r/r. Normal respiratory effort. Abdomen:mild diffuse tenderness to palpation, nondistended, hypoactive bowel sounds, increased tenderness in the left lower quadrant. Skin:  no rash or induration seen on limited exam Musculoskeletal:  grossly normal tone BUE/BLE Psychiatric:  grossly normal mood and affect, speech fluent and appropriate Neurologic:  grossly non-focal.          Labs on Admission:  Basic Metabolic Panel:  Recent Labs Lab 03/19/15 1228  NA 136  K 3.6  CL 99*  CO2 25  GLUCOSE 217*  BUN 16  CREATININE 1.17*  CALCIUM 9.5   Liver Function Tests:  Recent Labs Lab 03/19/15 1228  AST 21  ALT 16  ALKPHOS 83  BILITOT 0.5  PROT 7.2  ALBUMIN 3.8    Recent Labs Lab 03/19/15 1228  LIPASE 47   No results for input(s): AMMONIA in the last 168 hours. CBC:  Recent Labs Lab 03/19/15 1228  WBC 15.7*  NEUTROABS 11.2*  HGB 12.4  HCT 38.5  MCV 91.2  PLT 304   Cardiac Enzymes: No  results for input(s): CKTOTAL, CKMB, CKMBINDEX, TROPONINI in the last 168 hours.  BNP (last 3 results) No results for input(s): BNP in the last 8760 hours.  ProBNP (last 3 results) No results for input(s): PROBNP in the last 8760 hours.  CBG: No results for input(s): GLUCAP in the last 168 hours.  Radiological Exams on Admission: Ct Abdomen Pelvis W Contrast  03/19/2015   CLINICAL DATA:  Progressive left lower quadrant abdominal pain for the past 3 weeks.  EXAM: CT ABDOMEN AND PELVIS WITH CONTRAST  TECHNIQUE: Multidetector CT imaging of the abdomen and pelvis was performed using the standard protocol following bolus administration of intravenous contrast.  CONTRAST:  113mL OMNIPAQUE IOHEXOL 300 MG/ML  SOLN  COMPARISON:  None.  FINDINGS: Pronounced diffuse low density wall thickening involving the cecum, ascending colon and hepatic flexure with pericolonic soft tissue stranding and fluid. There is also a milder degree of diffuse low density wall thickening involving the mid and distal sigmoid colon without pericolonic soft tissue stranding. Single small diverticulum in that region. The remainder of the colon has a normal appearance. Small amount of free peritoneal fluid in the inferior pelvis. The appendix has a normal appearance in the mid to lower right pelvis.  5 mm lower pole left renal calculus. Smaller mid left renal calculus. Tiny posterior right renal calculus. Mild dilatation of the left renal collecting system and mild to moderate dilatation of the right renal collecting system. No ureteral dilatation or calculi seen. Normal appearing urinary bladder. 4.2 cm upper anterior right renal simple appearing cyst. Additional small bilateral renal cysts. The right kidney is malrotated.  2 tiny liver cysts. Unremarkable spleen, pancreas, gallbladder and adrenal glands. Surgically absent uterus. No adnexal masses or enlarged lymph nodes. Atheromatous arterial calcifications. Infrarenal abdominal aortic  aneurysm measuring 4.2 cm in maximum diameter on image number 43. No evidence of arterial or venous occlusion. Lumbar spine laminectomy defects and fixation hardware. Lumbar and lower thoracic spine degenerative changes. Small amount of linear atelectasis or scarring at both lung bases.  IMPRESSION: 1. Marked right sided colitis. This is most likely infectious or inflammatory in nature. Ischemic colitis is less likely. 2. Mild sigmoid colitis or chronic wall thickening due to previous inflammation. 3. Single a small sigmoid colon diverticulum. 4. Bilateral nonobstructing renal calculi. 5. 4.2 cm infrarenal abdominal aortic aneurysm. 6. Small amount of free peritoneal fluid.   Electronically Signed   By: Claudie Revering M.D.   On: 03/19/2015 17:37     Assessment/Plan Principal Problem:   Colitis Active Problems:   Diabetes mellitus, controlled   AKI (acute kidney injury)   Nephrolithiasis   AAA (abdominal aortic aneurysm) without rupture   HLD (hyperlipidemia)   Anxiety state   Colitis: Right-sided per CT. Likely infectious. WBC 15.7, afebrile, vital signs stable. Lactic acid 1.48. Lipase 47 - MedSurg - Continue Cipro Flagyl - Clear liquid diet, ADAT - Cdiff - NS 133ml/hr  AKI: Cardiomegaly 1.17. Recent 1.0. Slight elevation likely secondary to dehydration from acute GI loss. - IVF as above - BMET in a.m.  Diabetes: Last A1c 6.5 on 02/23/2015. - Continue home Actos - SSI  HTN: - Continue Norvasc, Diovan, labetalol   HLD: - Continue pravastatin  Nephrolithiasis: Bilateral nonobstructing renal calculi. UA showing marked proteinuria and leuko-urea, and many bacteria. Asymptomatic. - Urine culture - Follow-up urology   AAA: Patient with known history of AAA. Current measurement per CT of 4.2 cm. Previous measurement the ultrasound of 3.72. - Follow-up outpatient  Anxiety:  - continue valium  Code Status: FULL DVT Prophylaxis: Hep Family Communication: Daughter Disposition  Plan: pending improvement  Rorey Hodges J, MD Family Medicine [facility] [email or web address] Password [email or web address]

## 2015-03-20 ENCOUNTER — Ambulatory Visit: Payer: Medicare Other | Admitting: Internal Medicine

## 2015-03-20 LAB — GLUCOSE, CAPILLARY
GLUCOSE-CAPILLARY: 135 mg/dL — AB (ref 70–99)
GLUCOSE-CAPILLARY: 128 mg/dL — AB (ref 70–99)
Glucose-Capillary: 147 mg/dL — ABNORMAL HIGH (ref 70–99)

## 2015-03-20 LAB — BASIC METABOLIC PANEL
Anion gap: 11 (ref 5–15)
BUN: 13 mg/dL (ref 6–20)
CO2: 21 mmol/L — ABNORMAL LOW (ref 22–32)
Calcium: 8.5 mg/dL — ABNORMAL LOW (ref 8.9–10.3)
Chloride: 107 mmol/L (ref 101–111)
Creatinine, Ser: 0.94 mg/dL (ref 0.44–1.00)
GFR calc Af Amer: >60 mL/min (ref >60)
GFR, EST NON AFRICAN AMERICAN: 59 mL/min — AB (ref >60)
GLUCOSE: 129 mg/dL — AB (ref 70–99)
POTASSIUM: 3.1 mmol/L — AB (ref 3.5–5.1)
Sodium: 139 mmol/L (ref 135–145)

## 2015-03-20 LAB — CBC
HEMATOCRIT: 33.2 % — AB (ref 36.0–46.0)
HEMOGLOBIN: 10.9 g/dL — AB (ref 12.0–15.0)
MCH: 29.7 pg (ref 26.0–34.0)
MCHC: 32.8 g/dL (ref 30.0–36.0)
MCV: 90.5 fL (ref 78.0–100.0)
PLATELETS: 281 10*3/uL (ref 150–400)
RBC: 3.67 MIL/uL — ABNORMAL LOW (ref 3.87–5.11)
RDW: 14.1 % (ref 11.5–15.5)
WBC: 12.5 10*3/uL — ABNORMAL HIGH (ref 4.0–10.5)

## 2015-03-20 LAB — CLOSTRIDIUM DIFFICILE BY PCR: Toxigenic C. Difficile by PCR: NEGATIVE

## 2015-03-20 MED ORDER — IRBESARTAN 300 MG PO TABS
300.0000 mg | ORAL_TABLET | Freq: Every day | ORAL | Status: DC
  Filled 2015-03-20: qty 1

## 2015-03-20 NOTE — Care Management Note (Addendum)
Case Management Note  Patient Details  Name: Sheila Melton MRN: [id number] Date of Birth: Jul 15, 1941  Subjective/Objective:      75 yo female admitted with colitis from home              Action/Plan: Discharge planning  Expected Discharge Date:   (unknown)               Expected Discharge Plan:  Home/Self Care  In-House Referral:     Discharge planning Services  CM Consult  Post Acute Care Choice:    Choice offered to:     DME Arranged:    DME Agency:     HH Arranged:    [place] Agency:     Status of Service:  In process, will continue to follow  Medicare Important Message Given:   yes  Date Medicare IM Given:   03/21/15 Medicare IM give by:   Leanne Chang Date Additional Medicare IM Given:    Additional Medicare Important Message give by:     If discussed at [place] of Stay Meetings, dates discussed:    Additional Comments:  Scot Dock, RN 03/20/2015, 1:00 PM

## 2015-03-20 NOTE — Progress Notes (Signed)
[facility] PROGRESS NOTE  Sheila Melton [id number] DOB: [birth year] DOA: 03/19/2015 PCP: Unice Cobble, MD  Assessment/Plan: Colitis: Right-sided per CT. Likely infectious. WBC 15.7, afebrile, vital signs stable. Lactic acid 1.48. Lipase 47 - improving on  Cipro Flagyl - Clear liquid diet - Cdiff pcr pending  AKI: Cardiomegaly 1.17. Recent 1.0. Slight elevation likely secondary to dehydration from acute GI loss. - resolving with IVF rehydration and improvement in oral intake  Diabetes: Last A1c 6.5 on 02/23/2015. - Continue home Actos - SSI  HTN: - Stable, Continue Norvasc, Diovan, labetalol   HLD: - Stable, Continue pravastatin  Nephrolithiasis: Bilateral nonobstructing renal calculi. UA showing marked proteinuria and leuko-urea, and many bacteria. Asymptomatic. - Urine culture reviewed - Follow-up urology after discharge  AAA: Patient with known history of AAA. Current measurement per CT of 4.2 cm. Previous measurement the ultrasound of 3.72. - Follow-up as outpatient  Anxiety:  -Stable, continue valium  Code Status: Full Family Communication: Discussed directly with patient  Disposition Plan: Pending improvement in condition   Consultants:  None  Procedures:  None  Antibiotics:  Carb modified diet  HPI/Subjective: Patient has no new complaints.  Objective: Filed Vitals:   03/20/15 0545  BP: 140/65  Pulse: 105  Temp: 98.5 F (36.9 C)  Resp: 16    Intake/Output Summary (Last 24 hours) at 03/20/15 1713 Last data filed at 03/20/15 1125  Gross per 24 hour  Intake 1352.09 ml  Output    200 ml  Net 1152.09 ml   There were no vitals filed for this visit.  Exam:   General:  Patient in no acute distress, alert and awake  Cardiovascular: rrr, no rubs  Respiratory: cta bl, no wheezes  Abdomen: soft, NT, nd  Musculoskeletal: no cyanosis or clubbing   Data Reviewed: Basic Metabolic Panel:  Recent Labs Lab 03/19/15 1228  03/20/15 0525  NA 136 139  K 3.6 3.1*  CL 99* 107  CO2 25 21*  GLUCOSE 217* 129*  BUN 16 13  CREATININE 1.17* 0.94  CALCIUM 9.5 8.5*   Liver Function Tests:  Recent Labs Lab 03/19/15 1228  AST 21  ALT 16  ALKPHOS 83  BILITOT 0.5  PROT 7.2  ALBUMIN 3.8    Recent Labs Lab 03/19/15 1228  LIPASE 47   No results for input(s): AMMONIA in the last 168 hours. CBC:  Recent Labs Lab 03/19/15 1228 03/20/15 0525  WBC 15.7* 12.5*  NEUTROABS 11.2*  --   HGB 12.4 10.9*  HCT 38.5 33.2*  MCV 91.2 90.5  PLT 304 281   Cardiac Enzymes: No results for input(s): CKTOTAL, CKMB, CKMBINDEX, TROPONINI in the last 168 hours. BNP (last 3 results) No results for input(s): BNP in the last 8760 hours.  ProBNP (last 3 results) No results for input(s): PROBNP in the last 8760 hours.  CBG:  Recent Labs Lab 03/19/15 2117 03/20/15 0751 03/20/15 1213  GLUCAP 187* 135* 128*    No results found for this or any previous visit (from the past 240 hour(s)).   Studies: Ct Abdomen Pelvis W Contrast  03/19/2015   CLINICAL DATA:  Progressive left lower quadrant abdominal pain for the past 3 weeks.  EXAM: CT ABDOMEN AND PELVIS WITH CONTRAST  TECHNIQUE: Multidetector CT imaging of the abdomen and pelvis was performed using the standard protocol following bolus administration of intravenous contrast.  CONTRAST:  185mL OMNIPAQUE IOHEXOL 300 MG/ML  SOLN  COMPARISON:  None.  FINDINGS: Pronounced diffuse low density wall thickening involving the  cecum, ascending colon and hepatic flexure with pericolonic soft tissue stranding and fluid. There is also a milder degree of diffuse low density wall thickening involving the mid and distal sigmoid colon without pericolonic soft tissue stranding. Single small diverticulum in that region. The remainder of the colon has a normal appearance. Small amount of free peritoneal fluid in the inferior pelvis. The appendix has a normal appearance in the mid to lower right  pelvis.  5 mm lower pole left renal calculus. Smaller mid left renal calculus. Tiny posterior right renal calculus. Mild dilatation of the left renal collecting system and mild to moderate dilatation of the right renal collecting system. No ureteral dilatation or calculi seen. Normal appearing urinary bladder. 4.2 cm upper anterior right renal simple appearing cyst. Additional small bilateral renal cysts. The right kidney is malrotated.  2 tiny liver cysts. Unremarkable spleen, pancreas, gallbladder and adrenal glands. Surgically absent uterus. No adnexal masses or enlarged lymph nodes. Atheromatous arterial calcifications. Infrarenal abdominal aortic aneurysm measuring 4.2 cm in maximum diameter on image number 43. No evidence of arterial or venous occlusion. Lumbar spine laminectomy defects and fixation hardware. Lumbar and lower thoracic spine degenerative changes. Small amount of linear atelectasis or scarring at both lung bases.  IMPRESSION: 1. Marked right sided colitis. This is most likely infectious or inflammatory in nature. Ischemic colitis is less likely. 2. Mild sigmoid colitis or chronic wall thickening due to previous inflammation. 3. Single a small sigmoid colon diverticulum. 4. Bilateral nonobstructing renal calculi. 5. 4.2 cm infrarenal abdominal aortic aneurysm. 6. Small amount of free peritoneal fluid.   Electronically Signed   By: Claudie Revering M.D.   On: 03/19/2015 17:37    Scheduled Meds: . amLODipine  5 mg Oral BID  . ciprofloxacin  400 mg Intravenous Q12H  . heparin  5,000 Units Subcutaneous 3 times per day  . insulin aspart  0-9 Units Subcutaneous TID WC  . [START ON 03/21/2015] irbesartan  300 mg Oral QHS  . labetalol  300 mg Oral BID  . loratadine  10 mg Oral Daily  . metronidazole  500 mg Intravenous Q8H  . nicotine  21 mg Transdermal Daily  . pioglitazone  15 mg Oral Daily  . pravastatin  40 mg Oral Daily   Continuous Infusions: . sodium chloride 125 mL/hr at 03/19/15 1641   . sodium chloride 125 mL/hr (03/20/15 1500)    Principal Problem:   Colitis Active Problems:   Diabetes mellitus, controlled   AKI (acute kidney injury)   Nephrolithiasis   AAA (abdominal aortic aneurysm) without rupture   HLD (hyperlipidemia)   Anxiety state    Time spent: > 35 minutes    Velvet Bathe  [facility] Pager [phone]. If 7PM-7AM, please contact night-coverage at [email or web address], password [email or web address] 03/20/2015, 5:13 PM  LOS: 1 day

## 2015-03-21 ENCOUNTER — Telehealth: Payer: Self-pay | Admitting: *Deleted

## 2015-03-21 LAB — CBC
HCT: 32.3 % — ABNORMAL LOW (ref 36.0–46.0)
Hemoglobin: 10.3 g/dL — ABNORMAL LOW (ref 12.0–15.0)
MCH: 28.9 pg (ref 26.0–34.0)
MCHC: 31.9 g/dL (ref 30.0–36.0)
MCV: 90.7 fL (ref 78.0–100.0)
PLATELETS: 255 10*3/uL (ref 150–400)
RBC: 3.56 MIL/uL — ABNORMAL LOW (ref 3.87–5.11)
RDW: 13.8 % (ref 11.5–15.5)
WBC: 12.4 10*3/uL — AB (ref 4.0–10.5)

## 2015-03-21 LAB — GLUCOSE, CAPILLARY
Glucose-Capillary: 161 mg/dL — ABNORMAL HIGH (ref 70–99)
Glucose-Capillary: 113 mg/dL — ABNORMAL HIGH (ref 70–99)

## 2015-03-21 MED ORDER — METRONIDAZOLE 500 MG PO TABS: 500.0000 mg | ORAL_TABLET | Freq: Three times a day (TID) | ORAL | Status: DC

## 2015-03-21 MED ORDER — CIPROFLOXACIN HCL 500 MG PO TABS: 500.0000 mg | ORAL_TABLET | Freq: Two times a day (BID) | ORAL | Status: DC

## 2015-03-21 NOTE — Discharge Summary (Signed)
Sheila Melton, is a 74 y.o. female  DOB 12/01/1940  MRN [id number].  Admission date:  03/19/2015  Admitting Physician  Waldemar Dickens, MD  Discharge Date:  03/21/2015   Primary MD  Unice Cobble, MD  Recommendations for primary care physician for things to follow:   Review CT scan report, one time outpatient GI follow-up for possible colonoscopy.  Does have bilateral nonobstructing renal stones. Might benefit from urology input one time.  Repeat UA as she had significant prerenal you, if continues one time outpatient renal follow-up. Continue ARB and blood pressure control. Strict glycemic control.   Recheck CBC, CMP UA in 1 week   Admission Diagnosis  Diarrhea [R19.7] Colitis [K52.9]   Discharge Diagnosis  Diarrhea [R19.7] Colitis [K52.9]     Principal Problem:   Colitis Active Problems:   Diabetes mellitus, controlled   AKI (acute kidney injury)   Nephrolithiasis   AAA (abdominal aortic aneurysm) without rupture   HLD (hyperlipidemia)   Anxiety state      Past Medical History  Diagnosis Date  . Hyperlipidemia   . Diabetes mellitus     2  . Hypertension     unspecified essential  . Family history of anesthesia complication     PATIENTS MOM HAD TROUBLE WAKING UP   . Heart murmur   . Subclavian steal syndrome   . Subclavian steal syndrome     LEFT SIDE   NO SIDE EFFECTS  . AAA (abdominal aortic aneurysm)     BEING WATCHED   [facility]   . Arthritis   . CAP (community acquired pneumonia) 10/24-27/14     with SIRS  . UTI (lower urinary tract infection) 08/26/13    Enterococcus and Escherichia coli both sensitive to nitrofurantoin    Past Surgical History  Procedure Laterality Date  . Cataract extraction      OS  . G1 p1    . Abdominal hysterectomy    . Dilation and curettage of uterus    .  Cyst excision Right 05-23-13    Right index finger: cyst  . Spine surgery  Sept. 2013  . Eye surgery         History of present illness and  Hospital Course:     Kindly see H&P for history of present illness and admission details, please review complete Labs, Consult reports and Test reports for all details in brief  HPI    74 year old female with history of diabetes mellitus type 2, essential hypertension, dyslipidemia, AAA for which she follows with vascular surgery who was admitted to the hospital with symptoms of gastroenteritis and diarrhea, CT confirmed colitis, she was treated with IV Cipro and Flagyl along with hydration with good results. Now she is completely symptom free and eager to go home.  Hospital Course    1. Likely infectious gastroenteritis. Much improved with IV Cipro Flagyl and hydration, she is symptom-free, diarrhea and abdominal pain free. She wants to be discharged, husband bedside agrees. We be placed on oral Cipro Flagyl  for 4 days with outpatient follow-up with PCP, due to CT scan finding suspicious for diffuse colitis might benefit from one-time follow-up with her primary gastroenterologist Dr. Ardis Hughs. C. difficile was negative.   2. AAA. Follows with vascular surgery outpatient continue.   3. Essential hypertension. Continue home regimen unchanged.   4. Dyslipidemia on statin continue.   5. DM type II. Continue home regimen unchanged.   6. Bilateral nonobstructing renal stone with proteinuria. She is diabetic, on ACE/ARB, please repeat UA in a week. If proteinuria conspicuous she should follow with nephrologist, for nonobstructing renal stone one time outpatient urology input recommended.   Discharge Condition: Stable   Follow UP  Follow-up Information    Follow up with Unice Cobble, MD. Schedule an appointment as soon as possible for a visit in 1 week.   Specialty:  Internal Medicine   Contact information:   [address]  [address] [phone]       Follow up with Milus Banister, MD. Schedule an appointment as soon as possible for a visit in 1 week.   Specialty:  Gastroenterology   Why:  colonoscopy   Contact information:   [address] [place] [address] [phone]       Follow up with Sheila Potash., MD. Schedule an appointment as soon as possible for a visit in 1 week.   Specialty:  Nephrology   Why:  Protein in urine   Contact information:   [place]  [id number] [phone]         Discharge Instructions  and  Discharge Medications          Discharge Instructions    Discharge instructions    Complete by:  As directed   Follow with Primary MD Unice Cobble, MD in 7 days and Dr Ardis Hughs  Get CBC, CMP, 2 view Chest X ray checked  by Primary MD next visit.    Activity: As tolerated with Full fall precautions use walker/cane & assistance as needed   Disposition Home     Diet: Heart Healthy Low Carb  For Heart failure patients - Check your Weight same time everyday, if you gain over 2 pounds, or you develop in leg swelling, experience more shortness of breath or chest pain, call your Primary MD immediately. Follow Cardiac Low Salt Diet and 1.5 lit/day fluid restriction.   On your next visit with your primary care physician please Get Medicines reviewed and adjusted.   Please request your Prim.MD to go over all Hospital Tests and Procedure/Radiological results at the follow up, please get all Hospital records sent to your Prim MD by signing hospital release before you go home.   If you experience worsening of your admission symptoms, develop shortness of breath, life threatening emergency, suicidal or homicidal thoughts you must seek medical attention immediately by calling 911 or calling your MD immediately  if symptoms less severe.  You Must read complete instructions/literature along with all the possible adverse reactions/side effects for all the Medicines you  take and that have been prescribed to you. Take any new Medicines after you have completely understood and accpet all the possible adverse reactions/side effects.   Do not drive, operating heavy machinery, perform activities at heights, swimming or participation in water activities or provide baby sitting services if your were admitted for syncope or siezures until you have seen by Primary MD or a Neurologist and advised to do so again.  Do not drive when taking Pain  medications.    Do not take more than prescribed Pain, Sleep and Anxiety Medications  Special Instructions: If you have smoked or chewed Tobacco  in the last 2 yrs please stop smoking, stop any regular Alcohol  and or any Recreational drug use.  Wear Seat belts while driving.   Please note  You were cared for by a hospitalist during your hospital stay. If you have any questions about your discharge medications or the care you received while you were in the hospital after you are discharged, you can call the unit and asked to speak with the hospitalist on call if the hospitalist that took care of you is not available. Once you are discharged, your primary care physician will handle any further medical issues. Please note that NO REFILLS for any discharge medications will be authorized once you are discharged, as it is imperative that you return to your primary care physician (or establish a relationship with a primary care physician if you do not have one) for your aftercare needs so that they can reassess your need for medications and monitor your lab values.     Increase activity slowly    Complete by:  As directed             Medication List    STOP taking these medications        ibuprofen 200 MG tablet  Commonly known as:  ADVIL,MOTRIN      TAKE these medications        acetaminophen 500 MG tablet  Commonly known as:  TYLENOL  Take 500 mg by mouth every 6 (six) hours as needed for pain.     amLODipine 5 MG  tablet  Commonly known as:  NORVASC  Take 1 tablet (5 mg total) by mouth 2 (two) times daily.     azelastine 0.1 % nasal spray  Commonly known as:  ASTELIN  PLACE 1 SPRAY INTO THE NOSE 2 (TWO) TIMES DAILY. USE IN EACH NOSTRIL AS DIRECTED     BIOFREEZE EX  Apply 1 application topically 2 (two) times daily as needed (back pain).     ciprofloxacin 500 MG tablet  Commonly known as:  CIPRO  Take 1 tablet (500 mg total) by mouth 2 (two) times daily.     diazepam 5 MG tablet  Commonly known as:  VALIUM  TAKE 1 TABLET BY MOUTH AT BEDTIME AS NEEDED     DimenhyDRINATE 50 MG Chew  Chew 1 tablet by mouth as needed (nausea/vommitting).     fexofenadine 180 MG tablet  Commonly known as:  ALLEGRA  Take 180 mg by mouth at bedtime.     glucose blood test strip  Commonly known as:  ONE TOUCH ULTRA TEST  CHECK BLOOD SUGAR ONCE DAILY Dx 250.42     IMODIUM PO  Take 1 tablet by mouth as needed (for loose stools).     labetalol 300 MG tablet  Commonly known as:  NORMODYNE  Take 1 tablet (300 mg total) by mouth 2 (two) times daily.     metroNIDAZOLE 500 MG tablet  Commonly known as:  FLAGYL  Take 1 tablet (500 mg total) by mouth 3 (three) times daily.     pioglitazone 15 MG tablet  Commonly known as:  ACTOS  Take 1 tablet (15 mg total) by mouth daily.     pravastatin 40 MG tablet  Commonly known as:  PRAVACHOL  1 by mouth daily     SALONPAS Pads  Apply 1  each topically every 8 (eight) hours as needed (pain).     SUPER B COMPLEX PO  Take 1 tablet by mouth daily.     valsartan 320 MG tablet  Commonly known as:  DIOVAN  Take 1 tablet (320 mg total) by mouth at bedtime.     Vitamin D3 2000 UNITS Tabs  Take 2,000 Units by mouth daily.     vitamin E 400 UNIT capsule  Take 400 Units by mouth daily.          Diet and Activity recommendation: See Discharge Instructions above   Consults obtained - none   Major procedures and Radiology Reports - PLEASE review detailed and  final reports for all details, in brief -       Ct Abdomen Pelvis W Contrast  03/19/2015   CLINICAL DATA:  Progressive left lower quadrant abdominal pain for the past 3 weeks.  EXAM: CT ABDOMEN AND PELVIS WITH CONTRAST  TECHNIQUE: Multidetector CT imaging of the abdomen and pelvis was performed using the standard protocol following bolus administration of intravenous contrast.  CONTRAST:  162mL OMNIPAQUE IOHEXOL 300 MG/ML  SOLN  COMPARISON:  None.  FINDINGS: Pronounced diffuse low density wall thickening involving the cecum, ascending colon and hepatic flexure with pericolonic soft tissue stranding and fluid. There is also a milder degree of diffuse low density wall thickening involving the mid and distal sigmoid colon without pericolonic soft tissue stranding. Single small diverticulum in that region. The remainder of the colon has a normal appearance. Small amount of free peritoneal fluid in the inferior pelvis. The appendix has a normal appearance in the mid to lower right pelvis.  5 mm lower pole left renal calculus. Smaller mid left renal calculus. Tiny posterior right renal calculus. Mild dilatation of the left renal collecting system and mild to moderate dilatation of the right renal collecting system. No ureteral dilatation or calculi seen. Normal appearing urinary bladder. 4.2 cm upper anterior right renal simple appearing cyst. Additional small bilateral renal cysts. The right kidney is malrotated.  2 tiny liver cysts. Unremarkable spleen, pancreas, gallbladder and adrenal glands. Surgically absent uterus. No adnexal masses or enlarged lymph nodes. Atheromatous arterial calcifications. Infrarenal abdominal aortic aneurysm measuring 4.2 cm in maximum diameter on image number 43. No evidence of arterial or venous occlusion. Lumbar spine laminectomy defects and fixation hardware. Lumbar and lower thoracic spine degenerative changes. Small amount of linear atelectasis or scarring at both lung bases.   IMPRESSION: 1. Marked right sided colitis. This is most likely infectious or inflammatory in nature. Ischemic colitis is less likely. 2. Mild sigmoid colitis or chronic wall thickening due to previous inflammation. 3. Single a small sigmoid colon diverticulum. 4. Bilateral nonobstructing renal calculi. 5. 4.2 cm infrarenal abdominal aortic aneurysm. 6. Small amount of free peritoneal fluid.   Electronically Signed   By: Claudie Revering M.D.   On: 03/19/2015 17:37    Micro Results      Recent Results (from the past 240 hour(s))  Clostridium Difficile by PCR     Status: None   Collection Time: 03/20/15  2:49 PM  Result Value Ref Range Status   C difficile by pcr NEGATIVE NEGATIVE Final       Today   Subjective:   Pariss Redlich today has no headache,no chest abdominal pain,no new weakness tingling or numbness, feels much better wants to go home today.    Objective:   Blood pressure 143/60, pulse 108, temperature 98.7 F (37.1 C), temperature source Oral,  resp. rate 18, SpO2 94 %.   Intake/Output Summary (Last 24 hours) at 03/21/15 0944 Last data filed at 03/21/15 0310  Gross per 24 hour  Intake 2520.83 ml  Output    200 ml  Net 2320.83 ml    Exam Awake Alert, Oriented x 3, No new F.N deficits, Normal affect [place].AT,PERRAL Supple Neck,No JVD, No cervical lymphadenopathy appriciated.  Symmetrical Chest wall movement, Good air movement bilaterally, CTAB RRR,No Gallops,Rubs or new Murmurs, No Parasternal Heave +ve B.Sounds, Abd Soft, Non tender, No organomegaly appriciated, No rebound -guarding or rigidity. No Cyanosis, Clubbing or edema, No new Rash or bruise  Data Review   CBC w Diff:  Lab Results  Component Value Date   WBC 12.4* 03/21/2015   HGB 10.3* 03/21/2015   HCT 32.3* 03/21/2015   PLT 255 03/21/2015   LYMPHOPCT 16 03/19/2015   MONOPCT 9 03/19/2015   EOSPCT 4 03/19/2015   BASOPCT 0 03/19/2015    CMP:  Lab Results  Component Value Date   NA 139 03/20/2015   K  3.1* 03/20/2015   CL 107 03/20/2015   CO2 21* 03/20/2015   BUN 13 03/20/2015   CREATININE 0.94 03/20/2015   PROT 7.2 03/19/2015   ALBUMIN 3.8 03/19/2015   BILITOT 0.5 03/19/2015   ALKPHOS 83 03/19/2015   AST 21 03/19/2015   ALT 16 03/19/2015  .   Total Time in preparing paper work, data evaluation and todays exam - 35 minutes  Thurnell Lose M.D on 03/21/2015 at 9:44 AM  [facility]   Office  [phone]

## 2015-03-21 NOTE — Progress Notes (Signed)
Patient discharge home with husband, alert and oriented, discharge instructions given, patient and husband verbalize understanding of discharge instructions given, My Chart member active, patient in stable condition at this time

## 2015-03-21 NOTE — Discharge Instructions (Signed)
Follow with Primary MD Unice Cobble, MD in 7 days and Dr Ardis Hughs  Get CBC, CMP, 2 view Chest X ray checked  by Primary MD next visit.    Activity: As tolerated with Full fall precautions use walker/cane & assistance as needed   Disposition Home     Diet: Heart Healthy Low Carb  For Heart failure patients - Check your Weight same time everyday, if you gain over 2 pounds, or you develop in leg swelling, experience more shortness of breath or chest pain, call your Primary MD immediately. Follow Cardiac Low Salt Diet and 1.5 lit/day fluid restriction.   On your next visit with your primary care physician please Get Medicines reviewed and adjusted.   Please request your Prim.MD to go over all Hospital Tests and Procedure/Radiological results at the follow up, please get all Hospital records sent to your Prim MD by signing hospital release before you go home.   If you experience worsening of your admission symptoms, develop shortness of breath, life threatening emergency, suicidal or homicidal thoughts you must seek medical attention immediately by calling 911 or calling your MD immediately  if symptoms less severe.  You Must read complete instructions/literature along with all the possible adverse reactions/side effects for all the Medicines you take and that have been prescribed to you. Take any new Medicines after you have completely understood and accpet all the possible adverse reactions/side effects.   Do not drive, operating heavy machinery, perform activities at heights, swimming or participation in water activities or provide baby sitting services if your were admitted for syncope or siezures until you have seen by Primary MD or a Neurologist and advised to do so again.  Do not drive when taking Pain medications.    Do not take more than prescribed Pain, Sleep and Anxiety Medications  Special Instructions: If you have smoked or chewed Tobacco  in the last 2 yrs please stop  smoking, stop any regular Alcohol  and or any Recreational drug use.  Wear Seat belts while driving.   Please note  You were cared for by a hospitalist during your hospital stay. If you have any questions about your discharge medications or the care you received while you were in the hospital after you are discharged, you can call the unit and asked to speak with the hospitalist on call if the hospitalist that took care of you is not available. Once you are discharged, your primary care physician will handle any further medical issues. Please note that NO REFILLS for any discharge medications will be authorized once you are discharged, as it is imperative that you return to your primary care physician (or establish a relationship with a primary care physician if you do not have one) for your aftercare needs so that they can reassess your need for medications and monitor your lab values.

## 2015-03-21 NOTE — Telephone Encounter (Signed)
Transition Care Management Follow-up Telephone Call D/C 03/21/15  How have you been since you were released from the hospital? Pt states she is ok she just got home going to pick her medicine up.   Do you understand why you were in the hospital? YES   Do you understand the discharge instrcutions? YES  Items Reviewed:  Medications reviewed: YES  Allergies reviewed: YES  Dietary changes reviewed: NO  Referrals reviewed: No referral needed   Functional Questionnaire:   Activities of Daily Living (ADLs):   She states she are independent in the following: ambulation, bathing and hygiene, feeding, continence, grooming, toileting and dressing States she doesn't require assistance    Any transportation issues/concerns?: No   Any patient concerns? No   Confirmed importance and date/time of follow-up visits scheduled: YES, appt made for 03/26/15 w/Dr. Linna Darner   Confirmed with patient if condition begins to worsen call PCP or go to the ER.

## 2015-03-26 ENCOUNTER — Other Ambulatory Visit (INDEPENDENT_AMBULATORY_CARE_PROVIDER_SITE_OTHER): Payer: Medicare Other

## 2015-03-26 ENCOUNTER — Ambulatory Visit (INDEPENDENT_AMBULATORY_CARE_PROVIDER_SITE_OTHER): Payer: Medicare Other | Admitting: Internal Medicine

## 2015-03-26 ENCOUNTER — Encounter: Payer: Self-pay | Admitting: Internal Medicine

## 2015-03-26 VITALS — BP 130/56 | HR 83 | Temp 97.9°F | Wt 173.8 lb

## 2015-03-26 DIAGNOSIS — R609 Edema, unspecified: Secondary | ICD-10-CM

## 2015-03-26 DIAGNOSIS — R195 Other fecal abnormalities: Secondary | ICD-10-CM

## 2015-03-26 DIAGNOSIS — R809 Proteinuria, unspecified: Secondary | ICD-10-CM | POA: Insufficient documentation

## 2015-03-26 DIAGNOSIS — K529 Noninfective gastroenteritis and colitis, unspecified: Secondary | ICD-10-CM

## 2015-03-26 LAB — CBC WITH DIFFERENTIAL/PLATELET
Basophils Absolute: 0.1 10*3/uL (ref 0.0–0.1)
Basophils Relative: 0.6 % (ref 0.0–3.0)
EOS ABS: 0.5 10*3/uL (ref 0.0–0.7)
EOS PCT: 5.4 % — AB (ref 0.0–5.0)
HEMATOCRIT: 35.7 % — AB (ref 36.0–46.0)
Hemoglobin: 12.1 g/dL (ref 12.0–15.0)
Lymphocytes Relative: 22.3 % (ref 12.0–46.0)
Lymphs Abs: 2.2 10*3/uL (ref 0.7–4.0)
MCHC: 33.8 g/dL (ref 30.0–36.0)
MCV: 87.2 fl (ref 78.0–100.0)
MONO ABS: 1.2 10*3/uL — AB (ref 0.1–1.0)
Monocytes Relative: 12.3 % — ABNORMAL HIGH (ref 3.0–12.0)
Neutro Abs: 5.8 10*3/uL (ref 1.4–7.7)
Neutrophils Relative %: 59.4 % (ref 43.0–77.0)
PLATELETS: 417 10*3/uL — AB (ref 150.0–400.0)
RBC: 4.09 Mil/uL (ref 3.87–5.11)
RDW: 14.4 % (ref 11.5–15.5)
WBC: 9.8 10*3/uL (ref 4.0–10.5)

## 2015-03-26 NOTE — Assessment & Plan Note (Signed)
Recheck CBC and differential

## 2015-03-26 NOTE — Assessment & Plan Note (Signed)
There 24-hour urine for protein  Nephrology referral

## 2015-03-26 NOTE — Patient Instructions (Addendum)
The Nephrology referral will be scheduled and you'll be notified of the time.Please call the Referral Co-Ordinator @ [phone] if you have not been notified of appointment time within 7-10 days.   Your next office appointment will be determined based upon review of your pending labs . Those instructions will be transmitted to you by My Chart Critical results will be called.   Followup as needed for any active or acute issue. Please report any significant change in your symptoms.

## 2015-03-26 NOTE — Progress Notes (Signed)
   Subjective:    Patient ID: Sheila Melton, female    DOB: 13-Feb-1941, 74 y.o.   MRN: [id number]  HPI The hospital records 5/9-5/11/16 were reviewed. She was admitted with gastroenteritis with CT documented colitis. There was significant clinical response to IV Cipro and Flagyl. C. difficile was negative. She was found to have bilateral renal calculi. Urine revealed greater than 300 mg/dL of protein. White count was 12,400 @ discharge. Hemoglobin 10.3 and hematocrit 30.3. Her creatinine was 0.94, BUN 13, GFR 59.  At home she has had black stool; she took Pepto-Bismol most recently 1 week ago. As of 5/14 she had 3 loose stools. She denies other GI symptoms.  Fasting blood sugars are less than 160. She has no hypoglycemia.  On Actos she's gained 20 pounds and has significant edema. Her most recent A1c was 6.5% on 02/23/15.   Review of Systems Unexplained weight loss, abdominal pain, significant dyspepsia, dysphagia, rectal bleeding, or persistently small caliber stools are denied. Cough, sputum production, hemoptysis, or pleuritic pain denied. No diarrhea present.Cola colored urine or clay colored stools denied. Dysuria, pyuria, or hematuria not present. No new rashes, pustules, vesicles. No redness or swelling of joints.      Objective:   Physical Exam Pertinent or positive findings include: She has a grade 1 systolic murmur.  Abdomen is protuberant.  She has crepitus in the knees.  Pedal pulses are decreased.  She has trace pedal edema.  General appearance :adequately nourished; in no distress. Eyes: No conjunctival inflammation or scleral icterus is present. Oral exam:  Lips and gums are healthy appearing.There is no oropharyngeal erythema or exudate noted. Dental hygiene is good. Heart:  Normal rate and regular rhythm. S1 and S2 normal without gallop,  click, rub or other extra sounds   Lungs:Chest clear to auscultation; no wheezes, rhonchi,rales ,or rubs present.No increased work  of breathing.  Abdomen: bowel sounds normal, soft and non-tender without masses, organomegaly or hernias noted.  No guarding or rebound. No flank tenderness to percussion. Vascular : all pulses equal ; no bruits present. Skin:Warm & dry.  Intact without suspicious lesions or rashes ; no tenting or jaundice  Lymphatic: No lymphadenopathy is noted about the head, neck, axilla Neuro: Strength, tone normal.         Assessment & Plan:  See Current Assessment & Plan in Problem List under specific Diagnosis

## 2015-03-26 NOTE — Progress Notes (Signed)
Pre visit review using our clinic review tool, if applicable. No additional management support is needed unless otherwise documented below in the visit note. 

## 2015-03-28 ENCOUNTER — Other Ambulatory Visit: Payer: Medicare Other

## 2015-03-29 ENCOUNTER — Other Ambulatory Visit: Payer: Medicare Other

## 2015-03-29 DIAGNOSIS — E611 Iron deficiency: Secondary | ICD-10-CM

## 2015-03-29 DIAGNOSIS — K529 Noninfective gastroenteritis and colitis, unspecified: Secondary | ICD-10-CM

## 2015-03-29 DIAGNOSIS — R195 Other fecal abnormalities: Secondary | ICD-10-CM

## 2015-03-29 LAB — PROTEIN, URINE, 24 HOUR
PROTEIN 24H UR: 4509 mg/d — AB (ref <150)
Protein, Urine: 167 mg/dL — ABNORMAL HIGH (ref 5–24)

## 2015-03-30 ENCOUNTER — Other Ambulatory Visit: Payer: Self-pay

## 2015-03-30 ENCOUNTER — Other Ambulatory Visit: Payer: Self-pay | Admitting: Internal Medicine

## 2015-03-30 ENCOUNTER — Other Ambulatory Visit (INDEPENDENT_AMBULATORY_CARE_PROVIDER_SITE_OTHER): Payer: Medicare Other

## 2015-03-30 DIAGNOSIS — D509 Iron deficiency anemia, unspecified: Secondary | ICD-10-CM | POA: Diagnosis not present

## 2015-03-30 DIAGNOSIS — E611 Iron deficiency: Secondary | ICD-10-CM

## 2015-03-30 DIAGNOSIS — R809 Proteinuria, unspecified: Secondary | ICD-10-CM

## 2015-03-30 LAB — HEMOCCULT SLIDES (X 3 CARDS)
Fecal Occult Blood: NEGATIVE
OCCULT 1: NEGATIVE
OCCULT 2: NEGATIVE
OCCULT 3: NEGATIVE
OCCULT 4: NEGATIVE
OCCULT 5: NEGATIVE

## 2015-04-04 ENCOUNTER — Encounter: Payer: Self-pay | Admitting: Internal Medicine

## 2015-04-05 ENCOUNTER — Other Ambulatory Visit: Payer: Self-pay | Admitting: Internal Medicine

## 2015-04-05 DIAGNOSIS — R609 Edema, unspecified: Secondary | ICD-10-CM

## 2015-04-05 MED ORDER — FUROSEMIDE 40 MG PO TABS: ORAL_TABLET | ORAL | Status: DC

## 2015-04-10 ENCOUNTER — Other Ambulatory Visit: Payer: Self-pay | Admitting: Internal Medicine

## 2015-04-11 ENCOUNTER — Other Ambulatory Visit: Payer: Self-pay | Admitting: Internal Medicine

## 2015-04-11 ENCOUNTER — Telehealth: Payer: Self-pay | Admitting: Internal Medicine

## 2015-04-11 NOTE — Telephone Encounter (Signed)
Patient called regarding her nephrology referral. She states she doesn't want to wait 2-3 months for an appointment. I explained to the patient that every referral is sent with detailed history, notes, and labs and is reviewed by an MD. She states she had 2 scans with contrast right before her blood work and thinks that may have affected her lab results. Would you recommend that she have her labs rechecked or would you like [place] to refer her to a different nephrologist? Please advise.  Patient also states that her back pain is increasing and her doctor is recommending steroid injections to help, but that she can't have them due to her elevated blood sugar levels. Fasting glucose running 165-185, 2 hrs after dinner last night it was 205. Patient states she is taking 2 200mg  ibuprofen for her back pain because the tramadol gave her mouth ulcers.

## 2015-04-14 ENCOUNTER — Encounter: Payer: Self-pay | Admitting: Internal Medicine

## 2015-04-16 NOTE — Telephone Encounter (Signed)
Please advise, thanks.

## 2015-04-16 NOTE — Telephone Encounter (Signed)
She needs OV

## 2015-04-16 NOTE — Telephone Encounter (Signed)
Would you mind calling this patient and scheduling an office visit and please let her know the referral has been put in, but she still needs an ov, thanks.

## 2015-04-17 ENCOUNTER — Other Ambulatory Visit: Payer: Self-pay

## 2015-04-17 ENCOUNTER — Telehealth: Payer: Self-pay

## 2015-04-17 MED ORDER — DIAZEPAM 5 MG PO TABS: 5.0000 mg | ORAL_TABLET | Freq: Every evening | ORAL | Status: DC | PRN

## 2015-04-17 NOTE — Telephone Encounter (Signed)
Pt requesting refill of diazepam 5 mg tablet. Last OV 03/26/15. Please advise

## 2015-04-17 NOTE — Telephone Encounter (Signed)
OK X1 

## 2015-04-17 NOTE — Telephone Encounter (Signed)
Valium rx sent to pharm

## 2015-04-19 ENCOUNTER — Ambulatory Visit: Payer: Medicare Other | Admitting: Internal Medicine

## 2015-04-25 ENCOUNTER — Ambulatory Visit (INDEPENDENT_AMBULATORY_CARE_PROVIDER_SITE_OTHER): Payer: Medicare Other | Admitting: Internal Medicine

## 2015-04-25 ENCOUNTER — Telehealth: Payer: Self-pay | Admitting: Emergency Medicine

## 2015-04-25 ENCOUNTER — Other Ambulatory Visit: Payer: Medicare Other

## 2015-04-25 ENCOUNTER — Encounter: Payer: Self-pay | Admitting: Internal Medicine

## 2015-04-25 VITALS — BP 154/70 | HR 87 | Temp 98.0°F | Resp 18 | Wt 169.0 lb

## 2015-04-25 DIAGNOSIS — E1129 Type 2 diabetes mellitus with other diabetic kidney complication: Secondary | ICD-10-CM

## 2015-04-25 DIAGNOSIS — M5137 Other intervertebral disc degeneration, lumbosacral region: Secondary | ICD-10-CM

## 2015-04-25 DIAGNOSIS — E1165 Type 2 diabetes mellitus with hyperglycemia: Principal | ICD-10-CM

## 2015-04-25 DIAGNOSIS — N179 Acute kidney failure, unspecified: Secondary | ICD-10-CM

## 2015-04-25 DIAGNOSIS — IMO0002 Reserved for concepts with insufficient information to code with codable children: Secondary | ICD-10-CM

## 2015-04-25 MED ORDER — METFORMIN HCL 500 MG PO TABS: ORAL_TABLET | ORAL | Status: DC

## 2015-04-25 MED ORDER — MELOXICAM 7.5 MG PO TABS: 7.5000 mg | ORAL_TABLET | Freq: Every day | ORAL | Status: DC

## 2015-04-25 NOTE — Patient Instructions (Signed)
  Your next office appointment will be determined based upon review of your pending labs  and  xrays  Those written interpretation of the lab results and instructions will be transmitted to you by My Chart   Critical results will be called.   Followup as needed for any active or acute issue. Please report any significant change in your symptoms. 

## 2015-04-25 NOTE — Progress Notes (Signed)
Pre visit review using our clinic review tool, if applicable. No additional management support is needed unless otherwise documented below in the visit note. 

## 2015-04-25 NOTE — Telephone Encounter (Signed)
Pt is being seen today 04/25/15 and would like to know the status of her referral to a kidney dr. She stated last time she called our office she was told that they wouldn't be able to see her for 2-3 month. Can you please check the status of this. Thanks!

## 2015-04-25 NOTE — Progress Notes (Signed)
   Subjective:    Patient ID: Sheila Melton, female    DOB: [birth year], 74 y.o.   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 He has recommended steroid injections. She has been taking ibuprofen 2 in the morning and Tylenol 2 in the evening for pain. It is described as a level VII-X and constant. Bending over exacerbates the pain.  She had stopped Actos because of a 23 pound weight gain. Off Actos fasting glucose has been over 200 and postprandials over 300. She has been off metformin due to renal insufficiency. In January of this year creatinine was 1.5. On 5/10 her creatinine was 0.94, BUN 13, and GFR over 60. She has a tentative appointment to see a Nephrologist; but it appears the renal insufficiency was related to contrast and being ill with colitis.  Her most recent A1c was 6.5% on 02/23/15.  She denies any constitutional symptoms or any endocrinologic symptoms except for polyphagia.   Review of Systems She denies polydipsia or polyuria. She has no numbness, tingling, weakness in the legs.  There is no incontinence of urine or stool.    Objective:   Physical Exam  Pertinent or positive findings include: There is accentuation of upper thoracic curvature. Dorsalis pedis pulses are decreased. Deep tendon reflexes are 0 -1/2+ at the knees.  General appearance :adequately nourished; in no distress.  Eyes: No conjunctival inflammation or scleral icterus is present.  Oral exam:  Lips and gums are healthy appearing.There is no oropharyngeal erythema or exudate noted. Dental hygiene is good.  Heart:  Normal rate and regular rhythm. S1 and S2 normal without gallop, murmur, click, rub or other extra sounds    Lungs:Chest clear to auscultation; no wheezes, rhonchi,rales ,or rubs present.No increased work of breathing.   Abdomen: bowel sounds normal, soft and non-tender without masses, organomegaly or hernias noted.  No guarding or  rebound.   Vascular : all pulses equal ; no bruits present.  Skin:Warm & dry.  Intact without suspicious lesions or rashes ; no tenting or jaundice   Lymphatic: No lymphadenopathy is noted about the head, neck, axilla.   Neuro: Strength, tone  normal.        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 Fructosamine will be checked. Meloxicam will be substituted for the ibuprofen. DM labs will be rechecked in 6 weeks.

## 2015-04-29 LAB — FRUCTOSAMINE: Fructosamine: 332 umol/L — ABNORMAL HIGH (ref 190–270)

## 2015-05-04 NOTE — Telephone Encounter (Signed)
Spoke w/pt. She is aware that her nephrology appt will be approximately 2 months.

## 2015-05-07 ENCOUNTER — Other Ambulatory Visit: Payer: Self-pay

## 2015-05-21 ENCOUNTER — Other Ambulatory Visit: Payer: Self-pay | Admitting: Internal Medicine

## 2015-05-23 ENCOUNTER — Other Ambulatory Visit: Payer: Self-pay | Admitting: Emergency Medicine

## 2015-05-23 ENCOUNTER — Telehealth: Payer: Self-pay | Admitting: Internal Medicine

## 2015-05-23 DIAGNOSIS — M5137 Other intervertebral disc degeneration, lumbosacral region: Secondary | ICD-10-CM

## 2015-05-23 MED ORDER — MELOXICAM 7.5 MG PO TABS: 7.5000 mg | ORAL_TABLET | Freq: Every day | ORAL | Status: DC

## 2015-05-23 NOTE — Telephone Encounter (Signed)
[facility]

## 2015-05-23 NOTE — Telephone Encounter (Signed)
Patient sent requesting Monday through MyChart requesting [facility] [address].  Please advise.

## 2015-05-30 ENCOUNTER — Other Ambulatory Visit: Payer: Self-pay | Admitting: Internal Medicine

## 2015-05-30 DIAGNOSIS — R609 Edema, unspecified: Secondary | ICD-10-CM

## 2015-05-30 MED ORDER — FUROSEMIDE 40 MG PO TABS: ORAL_TABLET | ORAL | Status: DC

## 2015-05-31 ENCOUNTER — Other Ambulatory Visit: Payer: Self-pay | Admitting: Internal Medicine

## 2015-05-31 DIAGNOSIS — R609 Edema, unspecified: Secondary | ICD-10-CM

## 2015-05-31 MED ORDER — FUROSEMIDE 40 MG PO TABS: ORAL_TABLET | ORAL | Status: DC

## 2015-06-14 ENCOUNTER — Other Ambulatory Visit: Payer: Self-pay | Admitting: Internal Medicine

## 2015-06-14 ENCOUNTER — Encounter: Payer: Self-pay | Admitting: Internal Medicine

## 2015-06-14 NOTE — Telephone Encounter (Signed)
Last OV 04/25/15, last refill 6/16 with 1 refill, please advise

## 2015-06-14 NOTE — Telephone Encounter (Signed)
OK  My retirement date is 11/10/2015; but I will be in office on a limited schedule Oct-Dec. To guarantee continuity of care you should transition your care to another PCP by Oct 1,2016.

## 2015-06-25 ENCOUNTER — Encounter: Payer: Self-pay | Admitting: Family

## 2015-06-26 ENCOUNTER — Encounter: Payer: Self-pay | Admitting: Family

## 2015-06-26 ENCOUNTER — Ambulatory Visit (INDEPENDENT_AMBULATORY_CARE_PROVIDER_SITE_OTHER)
Admission: RE | Admit: 2015-06-26 | Discharge: 2015-06-26 | Disposition: A | Discharge status: Home / Self Care | Payer: Medicare Other | Source: Ambulatory Visit | Attending: Family | Admitting: Family

## 2015-06-26 ENCOUNTER — Ambulatory Visit (INDEPENDENT_AMBULATORY_CARE_PROVIDER_SITE_OTHER): Payer: Medicare Other | Admitting: Family

## 2015-06-26 ENCOUNTER — Ambulatory Visit (HOSPITAL_COMMUNITY)
Admission: RE | Admit: 2015-06-26 | Discharge: 2015-06-26 | Disposition: A | Discharge status: Home / Self Care | Payer: Medicare Other | Source: Ambulatory Visit | Attending: Family | Admitting: Family

## 2015-06-26 VITALS — BP 135/68 | HR 79 | Temp 97.2°F | Resp 16 | Ht 65.0 in | Wt 168.0 lb

## 2015-06-26 DIAGNOSIS — I10 Essential (primary) hypertension: Secondary | ICD-10-CM | POA: Insufficient documentation

## 2015-06-26 DIAGNOSIS — I739 Peripheral vascular disease, unspecified: Secondary | ICD-10-CM | POA: Insufficient documentation

## 2015-06-26 DIAGNOSIS — Z789 Other specified health status: Secondary | ICD-10-CM

## 2015-06-26 DIAGNOSIS — Z87891 Personal history of nicotine dependence: Secondary | ICD-10-CM

## 2015-06-26 DIAGNOSIS — R0989 Other specified symptoms and signs involving the circulatory and respiratory systems: Secondary | ICD-10-CM | POA: Diagnosis not present

## 2015-06-26 DIAGNOSIS — I714 Abdominal aortic aneurysm, without rupture, unspecified: Secondary | ICD-10-CM

## 2015-06-26 DIAGNOSIS — R200 Anesthesia of skin: Secondary | ICD-10-CM | POA: Insufficient documentation

## 2015-06-26 DIAGNOSIS — Z720 Tobacco use: Secondary | ICD-10-CM

## 2015-06-26 DIAGNOSIS — E119 Type 2 diabetes mellitus without complications: Secondary | ICD-10-CM | POA: Insufficient documentation

## 2015-06-26 NOTE — Progress Notes (Signed)
[facility] [place]  Established Abdominal Aortic Aneurysm  History of Present Illness  Sheila Melton is a 74 y.o. (1940-12-19) female patient of Dr. Donnetta Hutching who returns for followup of her small asymptomatic infrarenal abdominal aortic aneurysm.  Previous studies demonstrate an AAA, measuring 3.67 cm.  She has had lumbar disc surgery by Dr. Trenton Gammon. Sometimes she has low back pain, denies radiculopathy pain, she saw Dr. Trenton Gammon recently about this.  She had a bout of colitis about May 2016, she denies abdominal pain since then. Pt states her kidneys were badly affected by IV contrast for a recent MRI, but have improved since this, per pt. The patient is former a smokerbut is using vapor cigarettes with nicotine. The patient reports occasional pain in both hips and legs after walking about 10 minutes, she does not walk much due to this pain. She has started meloxicam for this which helps The patient denies history of stroke or TIA symptoms. Pt denies family history of AAA. Pt states her cardiologist or her PCP had checked an [place] of her neck for several years, no results on file, pt states this has not been done in a few years..  Pt Diabetic: Yes, started taking Actos, has resumed metformin, pt states last A1C was was 6.5; states she gained 17 pounds since starting the Actos.  Pt smoker: former smoker, quit cigarettes in 2014, using vapor cigarettes with nicotine, states low nicotine  Past Medical History  Diagnosis Date  . Hyperlipidemia   . Diabetes mellitus     2  . Hypertension     unspecified essential  . Family history of anesthesia complication     PATIENTS MOM HAD TROUBLE WAKING UP   . Heart murmur   . Subclavian steal syndrome   . Subclavian steal syndrome     LEFT SIDE   NO SIDE EFFECTS  . AAA (abdominal aortic aneurysm)     BEING WATCHED   [facility]   . Arthritis   . CAP (community acquired pneumonia) 10/24-27/14     with SIRS  . UTI (lower urinary tract  infection) 08/26/13    Enterococcus and Escherichia coli both sensitive to nitrofurantoin   Past Surgical History  Procedure Laterality Date  . Cataract extraction      OS  . G1 p1    . Abdominal hysterectomy    . Dilation and curettage of uterus    . Cyst excision Right 05-23-13    Right index finger: cyst  . Spine surgery  Sept. 2013  . Eye surgery     Social History Social History   Social History  . Marital Status: Married    Spouse Name: N/A  . Number of Children: N/A  . Years of Education: N/A   Occupational History  . Not on file.   Social History Main Topics  . Smoking status: Current Some Day Smoker -- 1.00 packs/day for 50 years    Types: E-cigarettes  . Smokeless tobacco: Never Used  . Alcohol Use: No  . Drug Use: No  . Sexual Activity: Not on file   Other Topics Concern  . Not on file   Social History Narrative   Has 3 caffeinated bev a day   Family History Family History  Problem Relation Age of Onset  . Coronary artery disease    . Diabetes    . Heart failure Mother   . Heart disease Mother   . Hypertension Mother   . Other Mother  varicose veins  . Deep vein thrombosis Mother   . Varicose Veins Mother   . Heart attack Mother   . Hypertension Sister     X 2  . Diabetes Sister   . Hyperlipidemia Sister   . Other Sister     varicose veins  . Heart disease Father   . Diabetes Father   . Hyperlipidemia Father   . Hypertension Father   . Heart attack Father   . Heart disease Brother     MI in late 51s  . Hyperlipidemia Brother   . Heart attack Brother   . Parkinsonism Brother   . Heart failure Brother   . Hypertension Daughter     Current Outpatient Prescriptions on File Prior to Visit  Medication Sig Dispense Refill  . acetaminophen (TYLENOL) 500 MG tablet Take 500 mg by mouth every 6 (six) hours as needed for pain.    Marland Kitchen amLODipine (NORVASC) 5 MG tablet TAKE 1 BY MOUTH TWICE DAILY 180 tablet 1  . B Complex-C (SUPER B COMPLEX PO)  Take 1 tablet by mouth daily.     . Cholecalciferol (VITAMIN D3) 2000 UNITS TABS Take 2,000 Units by mouth daily.     . diazepam (VALIUM) 5 MG tablet TAKE 1 TABLET BY MOUTH AT BEDTIME AS NEEDED 30 tablet 1  . fexofenadine (ALLEGRA) 180 MG tablet Take 180 mg by mouth at bedtime.     . furosemide (LASIX) 40 MG tablet 1/2-1 qd prn edema 90 tablet 1  . glucose blood (ONE TOUCH ULTRA TEST) test strip CHECK BLOOD SUGAR ONCE DAILY Dx 250.42 100 each 3  . labetalol (NORMODYNE) 300 MG tablet TAKE 1 BY MOUTH TWICE DAILY GENERIC FOR NORMODYNE. 180 tablet 1  . Liniments (SALONPAS) PADS Apply 1 each topically every 8 (eight) hours as needed (pain).    . Loperamide HCl (IMODIUM PO) Take 1 tablet by mouth as needed (for loose stools).    . meloxicam (MOBIC) 7.5 MG tablet Take 1 tablet (7.5 mg total) by mouth daily. 30 tablet 2  . Menthol, Topical Analgesic, (BIOFREEZE EX) Apply 1 application topically 2 (two) times daily as needed (back pain).    . metFORMIN (GLUCOPHAGE) 500 MG tablet 1 post eve meal 30 tablet 2  . pioglitazone (ACTOS) 15 MG tablet Take 15 mg by mouth daily.    . pravastatin (PRAVACHOL) 40 MG tablet TAKE 1 BY MOUTH DAILY 90 tablet 1  . valsartan (DIOVAN) 320 MG tablet TAKE 1 BY MOUTH AT BEDTIME 90 tablet 3  . vitamin E 400 UNIT capsule Take 400 Units by mouth daily.     No current facility-administered medications on file prior to visit.   Allergies  Allergen Reactions  . Amoxicillin     REACTION: rash  . Rocephin [Ceftriaxone Sodium In Dextrose]     10/24 /14 blisters of palms & diffuse rash Because of a history of documented adverse serious drug reaction;Medi Alert bracelet  is recommended  . Captopril     REACTION: cough  . Simvastatin     REACTION: buttocks cramps  . Tramadol Other (See Comments)    Stomach cramps, mouth sores- Pt was taking TWO tablets. She does not have any problems with taking ONE tablet at bedtime.  . Adhesive [Tape] Rash    ROS: See HPI for pertinent  positives and negatives.  Physical Examination  Filed Vitals:   06/26/15 0945  BP: 135/68  Pulse: 79  Temp: 97.2 F (36.2 C)  TempSrc: Oral  Resp:  16  Height: 5\' 5"  (1.651 m)  Weight: 168 lb (76.204 kg)  SpO2: 94%   Body mass index is 27.96 kg/(m^2).  General: A&O x 3, WD.  Pulmonary: Sym exp, good air movt, CTAB, no rales, rhonchi, or wheezing.  Cardiac: RRR, Nl S1, S2, no detected murmur.   Carotid Bruits Right Left   negative positive  Aorta is not palpable Radial pulses are 2+ right, 1+ left   VASCULAR EXAM:     LE Pulses Right Left   FEMORAL 2+ palpable not palpable    POPLITEAL not palpable  not palpable   POSTERIOR TIBIAL 2+ palpable  1+ palpable    DORSALIS PEDIS  ANTERIOR TIBIAL not palpable  not palpable      Gastrointestinal: soft, NTND, -G/R, - HSM, - palpable masses, - CVAT B.  Musculoskeletal: M/S 5/5 in UE's, 4/5 in LE's, Extremities without ischemic changes.  Neurologic: CN 2-12 intact, Motor exam as listed above.        Non-Invasive Vascular Imaging  AAA Duplex (06/26/2015) ABDOMINAL AORTA DUPLEX EVALUATION    INDICATION: Abdominal aortic aneurysm    PREVIOUS INTERVENTION(S):     DUPLEX EXAM:     LOCATION DIAMETER AP (cm) DIAMETER TRANSVERSE (cm) VELOCITIES (cm/sec)  Aorta Proximal 2.4 2.6 71  Aorta Mid 2.2 2.4 71  Aorta Distal 4.1 4.1 40  Right Common Iliac Artery 1.3 1.4 105  Left Common Iliac Artery 1.3 1.4 167    Previous max aortic diameter:  3.72cm Date: 06/20/14     ADDITIONAL FINDINGS: No hemodynamically significant stenosis of the abdominal aorta and bilateral proximal common iliac arteries.    IMPRESSION: Aneurysmal dilatation of the distal abdominal aorta with a maximum diameter of 4.1cm.    Compared to the  previous exam:  No significant change in the abdominal aortic aneurysm when compared to the previous exam.         Medical Decision Making  The patient is a 74 y.o. female who presents with asymptomatic AAA with a slight increase in size to 4.1 cm from 3.72 cm in a year.   Based on this patient's exam and diagnostic studies, the patient will follow up in 1 year  with the following studies: : AAA Duplex and carotid artery Duplex to evaluate left carotid bruit.  Consideration for repair of AAA would be made when the size is 5.5 cm, growth > 1 cm/yr, and symptomatic status.  I emphasized the importance of maximal medical management including strict control of blood pressure, blood glucose, and lipid levels, antiplatelet agents, obtaining regular exercise, and  cessation of smoking.   The patient was given information about AAA including signs, symptoms, treatment, and how to minimize the risk of enlargement and rupture of aneurysms.    The patient was advised to call 911 should the patient experience sudden onset abdominal or back pain.   Thank you for allowing [place] to participate in this patient's care.  Clemon Chambers, RN, MSN, FNP-C [facility] Office: [phone]  Clinic Physician: Early  06/26/2015, 10:16 AM

## 2015-06-26 NOTE — Patient Instructions (Signed)
Abdominal Aortic Aneurysm An aneurysm is a weakened or damaged part of an artery wall that bulges from the normal force of blood pumping through the body. An abdominal aortic aneurysm is an aneurysm that occurs in the lower part of the aorta, the main artery of the body.  The major concern with an abdominal aortic aneurysm is that it can enlarge and burst (rupture) or blood can flow between the layers of the wall of the aorta through a tear (aorticdissection). Both of these conditions can cause bleeding inside the body and can be life threatening unless diagnosed and treated promptly. CAUSES  The exact cause of an abdominal aortic aneurysm is unknown. Some contributing factors are:   A hardening of the arteries caused by the buildup of fat and other substances in the lining of a blood vessel (arteriosclerosis).  Inflammation of the walls of an artery (arteritis).   Connective tissue diseases, such as Marfan syndrome.   Abdominal trauma.   An infection, such as syphilis or staphylococcus, in the wall of the aorta (infectious aortitis) caused by bacteria. RISK FACTORS  Risk factors that contribute to an abdominal aortic aneurysm may include:  Age older than 60 years.   High blood pressure (hypertension).  Female gender.  Ethnicity (white race).  Obesity.  Family history of aneurysm (first degree relatives only).  Tobacco use. PREVENTION  The following healthy lifestyle habits may help decrease your risk of abdominal aortic aneurysm:  Quitting smoking. Smoking can raise your blood pressure and cause arteriosclerosis.  Limiting or avoiding alcohol.  Keeping your blood pressure, blood sugar level, and cholesterol levels within normal limits.  Decreasing your salt intake. In somepeople, too much salt can raise blood pressure and increase your risk of abdominal aortic aneurysm.  Eating a diet low in saturated fats and cholesterol.  Increasing your fiber intake by including  whole grains, vegetables, and fruits in your diet. Eating these foods may help lower blood pressure.  Maintaining a healthy weight.  Staying physically active and exercising regularly. SYMPTOMS  The symptoms of abdominal aortic aneurysm may vary depending on the size and rate of growth of the aneurysm.Most grow slowly and do not have any symptoms. When symptoms do occur, they may include:  Pain (abdomen, side, lower back, or groin). The pain may vary in intensity. A sudden onset of severe pain may indicate that the aneurysm has ruptured.  Feeling full after eating only small amounts of food.  Nausea or vomiting or both.  Feeling a pulsating lump in the abdomen.  Feeling faint or passing out. DIAGNOSIS  Since most unruptured abdominal aortic aneurysms have no symptoms, they are often discovered during diagnostic exams for other conditions. An aneurysm may be found during the following procedures:  Ultrasonography (A one-time screening for abdominal aortic aneurysm by ultrasonography is also recommended for all men aged 65-75 years who have ever smoked).  X-ray exams.  A computed tomography (CT).  Magnetic resonance imaging (MRI).  Angiography or arteriography. TREATMENT  Treatment of an abdominal aortic aneurysm depends on the size of your aneurysm, your age, and risk factors for rupture. Medication to control blood pressure and pain may be used to manage aneurysms smaller than 6 cm. Regular monitoring for enlargement may be recommended by your caregiver if:  The aneurysm is 3-4 cm in size (an annual ultrasonography may be recommended).  The aneurysm is 4-4.5 cm in size (an ultrasonography every 6 months may be recommended).  The aneurysm is larger than 4.5 cm in   size (your caregiver may ask that you be examined by a vascular surgeon). If your aneurysm is larger than 6 cm, surgical repair may be recommended. There are two main methods for repair of an aneurysm:   Endovascular  repair (a minimally invasive surgery). This is done most often.  Open repair. This method is used if an endovascular repair is not possible. Document Released: 08/06/2005 Document Revised: 02/21/2013 Document Reviewed: 11/26/2012 ExitCare Patient Information 2015 [employer]. This information is not intended to replace advice given to you by your health care provider. Make sure you discuss any questions you have with your health care provider.  

## 2015-06-27 NOTE — Addendum Note (Signed)
Addended by: Dorthula Rue L on: 06/27/2015 05:20 PM   Modules accepted: Orders

## 2015-07-20 ENCOUNTER — Other Ambulatory Visit: Payer: Self-pay | Admitting: Internal Medicine

## 2015-07-23 ENCOUNTER — Other Ambulatory Visit: Payer: Self-pay | Admitting: Emergency Medicine

## 2015-07-23 DIAGNOSIS — E1165 Type 2 diabetes mellitus with hyperglycemia: Principal | ICD-10-CM

## 2015-07-23 DIAGNOSIS — IMO0002 Reserved for concepts with insufficient information to code with codable children: Secondary | ICD-10-CM

## 2015-07-23 DIAGNOSIS — E1129 Type 2 diabetes mellitus with other diabetic kidney complication: Secondary | ICD-10-CM

## 2015-07-23 MED ORDER — METFORMIN HCL 500 MG PO TABS: ORAL_TABLET | ORAL | Status: DC

## 2015-08-07 ENCOUNTER — Ambulatory Visit (INDEPENDENT_AMBULATORY_CARE_PROVIDER_SITE_OTHER): Payer: Medicare Other

## 2015-08-07 DIAGNOSIS — Z23 Encounter for immunization: Secondary | ICD-10-CM

## 2015-08-09 ENCOUNTER — Other Ambulatory Visit: Payer: Self-pay | Admitting: Internal Medicine

## 2015-08-10 NOTE — Telephone Encounter (Signed)
Please advise. Last OV 6/16, last refill 06/13/15 #30 with 1 refill

## 2015-08-10 NOTE — Telephone Encounter (Signed)
OK X 1  My retirement date is 11/10/2015; but I will be in office on a limited schedule Oct-Dec. To guarantee continuity of care you should transition your care to another PCP ASAP

## 2015-08-14 ENCOUNTER — Other Ambulatory Visit: Payer: Self-pay | Admitting: Emergency Medicine

## 2015-08-14 MED ORDER — DIAZEPAM 5 MG PO TABS: 5.0000 mg | ORAL_TABLET | Freq: Every evening | ORAL | Status: DC | PRN

## 2015-08-14 NOTE — Telephone Encounter (Signed)
[facility]ium [facility]

## 2015-08-15 ENCOUNTER — Other Ambulatory Visit: Payer: Self-pay | Admitting: Internal Medicine

## 2015-08-15 NOTE — Telephone Encounter (Signed)
Please advise. Last OV 6/16. Last refill 05/23/15 #30 with 2 refills.

## 2015-08-15 NOTE — Telephone Encounter (Signed)
OK X1 

## 2015-09-04 ENCOUNTER — Encounter: Payer: Self-pay | Admitting: Family

## 2015-09-04 ENCOUNTER — Telehealth: Payer: Self-pay | Admitting: Internal Medicine

## 2015-09-04 NOTE — Telephone Encounter (Signed)
She should come in next month

## 2015-09-04 NOTE — Telephone Encounter (Signed)
Form given to dr hopper to complete

## 2015-09-04 NOTE — Telephone Encounter (Signed)
Please obtain form; she has COPD and PVD

## 2015-09-04 NOTE — Telephone Encounter (Signed)
Please advise, thanks.

## 2015-09-04 NOTE — Telephone Encounter (Signed)
Is requesting handicap placard form to be completed for patient.  Please follow up with patient.

## 2015-09-04 NOTE — Telephone Encounter (Signed)
Patient thought she had an OV with Dr. Linna Darner in November with labs.  I do not see this in her chart.  Please advise on when next visit needs to be.

## 2015-09-05 ENCOUNTER — Other Ambulatory Visit: Payer: Self-pay | Admitting: Internal Medicine

## 2015-09-05 DIAGNOSIS — E782 Mixed hyperlipidemia: Secondary | ICD-10-CM

## 2015-09-05 DIAGNOSIS — E1165 Type 2 diabetes mellitus with hyperglycemia: Secondary | ICD-10-CM

## 2015-09-05 DIAGNOSIS — IMO0002 Reserved for concepts with insufficient information to code with codable children: Secondary | ICD-10-CM

## 2015-09-05 DIAGNOSIS — N289 Disorder of kidney and ureter, unspecified: Secondary | ICD-10-CM

## 2015-09-05 DIAGNOSIS — I10 Essential (primary) hypertension: Secondary | ICD-10-CM

## 2015-09-05 DIAGNOSIS — E1129 Type 2 diabetes mellitus with other diabetic kidney complication: Secondary | ICD-10-CM

## 2015-09-05 NOTE — Telephone Encounter (Signed)
Patient would like labs entered that she needs to come for and she will make appt for november

## 2015-09-11 ENCOUNTER — Other Ambulatory Visit: Payer: Self-pay | Admitting: Internal Medicine

## 2015-09-16 ENCOUNTER — Other Ambulatory Visit: Payer: Self-pay | Admitting: Internal Medicine

## 2015-09-26 ENCOUNTER — Encounter: Payer: Self-pay | Admitting: Internal Medicine

## 2015-09-26 ENCOUNTER — Ambulatory Visit (INDEPENDENT_AMBULATORY_CARE_PROVIDER_SITE_OTHER): Payer: Medicare Other | Admitting: Internal Medicine

## 2015-09-26 ENCOUNTER — Other Ambulatory Visit (INDEPENDENT_AMBULATORY_CARE_PROVIDER_SITE_OTHER): Payer: Medicare Other

## 2015-09-26 VITALS — BP 150/70 | HR 84 | Temp 98.2°F | Ht 65.0 in | Wt 169.8 lb

## 2015-09-26 DIAGNOSIS — E782 Mixed hyperlipidemia: Secondary | ICD-10-CM

## 2015-09-26 DIAGNOSIS — E1165 Type 2 diabetes mellitus with hyperglycemia: Secondary | ICD-10-CM

## 2015-09-26 DIAGNOSIS — Z23 Encounter for immunization: Secondary | ICD-10-CM

## 2015-09-26 DIAGNOSIS — I129 Hypertensive chronic kidney disease with stage 1 through stage 4 chronic kidney disease, or unspecified chronic kidney disease: Secondary | ICD-10-CM

## 2015-09-26 DIAGNOSIS — I10 Essential (primary) hypertension: Secondary | ICD-10-CM | POA: Diagnosis not present

## 2015-09-26 DIAGNOSIS — N289 Disorder of kidney and ureter, unspecified: Secondary | ICD-10-CM

## 2015-09-26 DIAGNOSIS — E1129 Type 2 diabetes mellitus with other diabetic kidney complication: Secondary | ICD-10-CM | POA: Diagnosis not present

## 2015-09-26 DIAGNOSIS — IMO0002 Reserved for concepts with insufficient information to code with codable children: Secondary | ICD-10-CM

## 2015-09-26 LAB — BASIC METABOLIC PANEL
BUN: 17 mg/dL (ref 6–23)
CALCIUM: 9.5 mg/dL (ref 8.4–10.5)
CO2: 28 mEq/L (ref 19–32)
Chloride: 101 mEq/L (ref 96–112)
Creatinine, Ser: 1.05 mg/dL (ref 0.40–1.20)
GFR: 54.64 mL/min — AB (ref >60.00)
Glucose, Bld: 152 mg/dL — ABNORMAL HIGH (ref 70–99)
POTASSIUM: 3.2 meq/L — AB (ref 3.5–5.1)
SODIUM: 140 meq/L (ref 135–145)

## 2015-09-26 LAB — LIPID PANEL
CHOL/HDL RATIO: 6
Cholesterol: 245 mg/dL — ABNORMAL HIGH (ref 0–200)
HDL: 40.9 mg/dL (ref >39.00)
NONHDL: 203.69
Triglycerides: 322 mg/dL — ABNORMAL HIGH (ref 0.0–149.0)
VLDL: 64.4 mg/dL — AB (ref 0.0–40.0)

## 2015-09-26 LAB — LDL CHOLESTEROL, DIRECT: Direct LDL: 118 mg/dL

## 2015-09-26 LAB — HEPATIC FUNCTION PANEL
ALK PHOS: 84 U/L (ref 39–117)
ALT: 13 U/L (ref 0–35)
AST: 16 U/L (ref 0–37)
Albumin: 3.9 g/dL (ref 3.5–5.2)
BILIRUBIN DIRECT: 0.1 mg/dL (ref 0.0–0.3)
Total Bilirubin: 0.4 mg/dL (ref 0.2–1.2)
Total Protein: 7.1 g/dL (ref 6.0–8.3)

## 2015-09-26 LAB — HEMOGLOBIN A1C: HEMOGLOBIN A1C: 6.9 % — AB (ref 4.6–6.5)

## 2015-09-26 LAB — TSH: TSH: 4.2 u[IU]/mL (ref 0.35–4.50)

## 2015-09-26 NOTE — Patient Instructions (Signed)
The most common cause of elevated triglycerides (TG) is the ingestion of sugar from high fructose corn syrup sources added to processed foods & drinks or sugar in alcohol.   Consume less than 30 Grams (preferably ZERO) of sugar per day from foods & drinks with High Fructose Corn Syrup (HFCS) sugar as #1,2,3 or # 4 on label.[employer], [employer] do not carry products with HFCS.  Minimal Blood Pressure Goal= AVERAGE < 140/90;  Ideal is an AVERAGE < 130/80 according to Dr patel. This AVERAGE should be calculated from @ least 5-7 BP readings taken @ different times of day on different days of week. You should not respond to isolated BP readings , but rather the AVERAGE for that week .Please bring your  blood pressure cuff to office visits to verify that it is reliable.It  can also be checked against the blood pressure device at the pharmacy. Finger or wrist cuffs are not dependable; an arm cuff is.

## 2015-09-26 NOTE — Assessment & Plan Note (Signed)
20 pound weight gain with Actos; apparently Dr. Posey Pronto has suggested possible resuming metformin.  Endocrinology consultation appears appropriate with her multiple comorbidities and the renal risk with metformin.

## 2015-09-26 NOTE — Progress Notes (Signed)
   Subjective:    Patient ID: Sheila Melton, female    DOB: Jan 20, 1941, 74 y.o.   MRN: [id number]  HPI The patient is here to assess status of active health conditions.  She has been compliant with her medications; she is concerned the Actos is causing some edema and weight gain. She feels she's gained 20 pounds since it was started. She had previously been on metformin but this was discontinued because of renal insufficiency. The increase increatinine had been in the context of @ least 2 imaging studies with contrast.  She did see Dr. Posey Pronto her Nephrologist yesterday. He suggested possibly changing Actos back to metformin. His records were reviewed. She is felt to have chronic kidney disease, stage III. He stated her blood pressure goal should be less than 130/80. Sodium restriction was urged. He found her creatinine to be 1.11 and GFR 50. He documented microscopic hematuria and proteinuria which is ongoing. He has recommended an ultrasound.  A1c today is 6.9%; triglycerides were 322 and LDL 118.  She eats red meat several times a week. She states she avoids excessive fried foods. She is not exercising.   PMH, FH, & Social History reviewed & updated.No change in [name] as recorded.   Review of Systems  She does continue to have some edema. She has occasional dry mouth.  She denies numbness, tingling, burning in extremities. She has no nonhealing skin lesions.  Chest pain, palpitations, tachycardia, exertional dyspnea, paroxysmal nocturnal dyspnea,or claudication are absent. No unexplained weight loss, abdominal pain, significant dyspepsia, dysphagia, melena, rectal bleeding, or persistently small caliber stools. Dysuria, pyuria, hematuria, frequency, nocturia or polyuria are denied. Change in hair, skin, nails denied. No bowel changes of constipation or diarrhea. No intolerance to heat or cold.     Objective:   Physical Exam  Pertinent or positive findings include: Bilateral ptosis is  present. Wax is present in the right ear. She has a grade 1 systolic murmur at the base. Abdomen is protuberant. Pedal pulses are decreased. She has trace edema. Crepitus is noted in the knees. She has 0+ reflexes at the knees.  General appearance :adequately nourished; in no distress.  Eyes: No conjunctival inflammation or scleral icterus is present.  Oral exam:  Lips and gums are healthy appearing.There is no oropharyngeal erythema or exudate noted. Dental hygiene is good.  Heart:  Normal rate and regular rhythm. S1 and S2 normal without gallop, click, rub or other extra sounds    Lungs:Chest clear to auscultation; no wheezes, rhonchi,rales ,or rubs present.No increased work of breathing.   Abdomen: bowel sounds normal, soft and non-tender without masses, organomegaly or hernias noted.  No guarding or rebound.   Vascular : all pulses equal ; no bruits present.  Skin:Warm & dry.  Intact without suspicious lesions or rashes ; no tenting or jaundice   Lymphatic: No lymphadenopathy is noted about the head, neck, axilla.   Neuro: Strength, tone decreased     Assessment & Plan:  See Current Assessment & Plan in Problem List under specific Diagnosis

## 2015-09-26 NOTE — Progress Notes (Signed)
Pre visit review using our clinic review tool, if applicable. No additional management support is needed unless otherwise documented below in the visit note. 

## 2015-09-26 NOTE — Assessment & Plan Note (Signed)
Dr. Posey Pronto, Nephrology.  Associated with proteinuria most likely related to diabetes.  Secondary hyperparathyroidism, renal.  Dr. Posey Pronto established blood pressure goal of less than 130/80.

## 2015-09-26 NOTE — Assessment & Plan Note (Addendum)
Nutritional interventions to reduce the triglycerides was discussed in detail. A diagram was provided

## 2015-09-27 ENCOUNTER — Encounter: Payer: Self-pay | Admitting: Internal Medicine

## 2015-09-27 NOTE — Assessment & Plan Note (Signed)
BP goals discussed

## 2015-10-08 ENCOUNTER — Other Ambulatory Visit: Payer: Self-pay | Admitting: Internal Medicine

## 2015-10-09 ENCOUNTER — Other Ambulatory Visit: Payer: Self-pay | Admitting: Internal Medicine

## 2015-10-10 ENCOUNTER — Telehealth: Payer: Self-pay

## 2015-10-10 NOTE — Telephone Encounter (Signed)
  Last refill by me I am transitioning to full retirement as of 11/09/2015. Over the next several weeks; I shall be in the clinic on an abbreviated schedule.You should transfer your primary care to another primary care provider to guarantee continuity of care. Dr Billey Gosling has joined [facility] here @ [facility] ; I enthusiastically recommend her to you because of her clinical skills and compassion. Also Terri Piedra, NP @ [facility] is an outstanding Primary Care practitioner who truly cares about his patients. If you need additional medication refills;this would be an excellent opportunity to schedule an appointment with Dr Quay Burow or Marya Amsler.  It has been an Surveyor, minerals and blessing to have served you as your physician. I wish you the best in the future. [employer]

## 2015-10-10 NOTE — Telephone Encounter (Signed)
?  already done Thanks, [employer]

## 2015-10-10 NOTE — Telephone Encounter (Signed)
Rx called into pharmacy spoke with Clarise Cruz gave md approval. Pt has already set-up new appt with Dr. Quay Burow...Johny Chess

## 2015-10-10 NOTE — Telephone Encounter (Signed)
Call to Sheila Melton to educate regarding AWV; Was just in Nov; discussed o/d health screens/ Declines dexa for now as she has had back surgery. Also has had eyes checked but will schedule her diabetic exam with Dr. Idolina Primer prior to the end of the year Discussed Tetanus and shingles; Referred to insurance carrier regarding shingles vaccine for oop/ cost  May schedule next year;

## 2015-10-11 ENCOUNTER — Other Ambulatory Visit: Payer: Self-pay | Admitting: Nephrology

## 2015-10-11 DIAGNOSIS — N183 Chronic kidney disease, stage 3 unspecified: Secondary | ICD-10-CM

## 2015-10-15 ENCOUNTER — Other Ambulatory Visit: Payer: Medicare Other

## 2015-10-15 ENCOUNTER — Other Ambulatory Visit: Payer: Self-pay | Admitting: Internal Medicine

## 2015-10-17 ENCOUNTER — Other Ambulatory Visit: Payer: Self-pay | Admitting: Internal Medicine

## 2015-10-18 ENCOUNTER — Other Ambulatory Visit: Payer: Medicare Other

## 2015-10-23 ENCOUNTER — Other Ambulatory Visit: Payer: Self-pay | Admitting: *Deleted

## 2015-10-23 ENCOUNTER — Ambulatory Visit (INDEPENDENT_AMBULATORY_CARE_PROVIDER_SITE_OTHER): Payer: Medicare Other | Admitting: Internal Medicine

## 2015-10-23 ENCOUNTER — Encounter: Payer: Self-pay | Admitting: Internal Medicine

## 2015-10-23 VITALS — BP 102/60 | HR 85 | Temp 97.4°F | Resp 12 | Wt 170.6 lb

## 2015-10-23 DIAGNOSIS — E119 Type 2 diabetes mellitus without complications: Secondary | ICD-10-CM | POA: Insufficient documentation

## 2015-10-23 DIAGNOSIS — E1169 Type 2 diabetes mellitus with other specified complication: Secondary | ICD-10-CM | POA: Insufficient documentation

## 2015-10-23 DIAGNOSIS — E1159 Type 2 diabetes mellitus with other circulatory complications: Secondary | ICD-10-CM

## 2015-10-23 MED ORDER — GLUCOSE BLOOD VI STRP: ORAL_STRIP | Status: DC

## 2015-10-23 MED ORDER — METFORMIN HCL 500 MG PO TABS: ORAL_TABLET | ORAL | Status: DC

## 2015-10-23 NOTE — Progress Notes (Signed)
Patient ID: Sheila Melton, female   DOB: 12-Jul-1941, 74 y.o.   MRN: [id number]  HPI: Sheila Melton is a 74 y.o.-year-old female, referred by her PCP, Dr. Linna Darner, for management of DM2, dx in ~2006, non-insulin-dependent, controlled, with complications (stage 3 CKD, circulatory complications - AAA).  Last hemoglobin A1c was: Lab Results  Component Value Date   HGBA1C 6.9* 09/26/2015   HGBA1C 6.5 02/23/2015   HGBA1C 6.9* 11/16/2014   Pt is on a regimen of: - Actos 15 mg daily - started in 2014; she feels that she gained 20 pounds after she started Actos. - Metformin 500 mg daily at dinnertime She was previously on metformin, but this was stopped due to her CKD. She was recently cleared by her nephrologist, Dr. Posey Pronto to restart metformin.   Pt checks her sugars 1x a day and they are: - am: 135-140 - 2h after b'fast: n/c - before lunch: n/c - 2h after lunch: n/c - before dinner: n/c - 2h after dinner: n/c - bedtime: n/c - nighttime: n/c No lows. Lowest sugar was 135; ? hypoglycemia awareness.  Highest sugar was 140.  Glucometer: One Touch   Pt's meals are: - Breakfast: cereals, bacon + eggs - Lunch: cheese crackers - Dinner: meat + veggies - Snacks: 1 or 2  - + CKD stage 3, last BUN/creatinine:  Lab Results  Component Value Date   BUN 17 09/26/2015   CREATININE 1.05 09/26/2015  GFR 54. She had the lower GFR before, but this has been in the context of contrast substance. On Diovan. - last set of lipids: Lab Results  Component Value Date   CHOL 245* 09/26/2015   HDL 40.90 09/26/2015   LDLCALC 123* 05/24/2014   LDLDIRECT 118.0 09/26/2015   TRIG 322.0* 09/26/2015   CHOLHDL 6 09/26/2015  On Pravastatin. - last eye exam was fall 2015. No DR. Dr Idolina Primer.   - no numbness and tingling in her feet.  Pt has FH of DM in father, sister.  She also has HTN, HL.  ROS: Constitutional: + weight gain, +  fatigue, + subjective hypothermia Eyes: no blurry vision, no  xerophthalmia ENT: no sore throat, no nodules palpated in throat, no dysphagia/odynophagia, no hoarseness Cardiovascular: no CP/SOB/palpitations/+ leg swelling Respiratory: no cough/SOB Gastrointestinal: no N/V/D/C Musculoskeletal: no muscle/joint aches, + back pain Skin: no rashes, + easy bruising Neurological: no tremors/numbness/tingling/dizziness Psychiatric: no depression/anxiety  Past Medical History  Diagnosis Date  . Hyperlipidemia   . Diabetes mellitus     2  . Hypertension     unspecified essential  . Family history of anesthesia complication     PATIENTS MOM HAD TROUBLE WAKING UP   . Heart murmur   . Subclavian steal syndrome   . Subclavian steal syndrome     LEFT SIDE   NO SIDE EFFECTS  . AAA (abdominal aortic aneurysm) (HCC)     BEING WATCHED   [facility]   . Arthritis   . CAP (community acquired pneumonia) 10/24-27/14     with SIRS  . UTI (lower urinary tract infection) 08/26/13    Enterococcus and Escherichia coli both sensitive to nitrofurantoin   Past Surgical History  Procedure Laterality Date  . Cataract extraction      OS  . G1 p1    . Abdominal hysterectomy    . Dilation and curettage of uterus    . Cyst excision Right 05-23-13    Right index finger: cyst  . Spine surgery  Sept. 2013  .  Eye surgery     Social History   Social History  . Marital Status: Married    Spouse Name: N/A  . Number of Children: 1   Occupational History  . retied   Social History Main Topics  . Smoking status: Current Some Day Smoker -- 1.00 packs/day for 50 years    Types: E-cigarettes  . Smokeless tobacco: Never Used  . Alcohol Use: No  . Drug Use: No  . Sexual Activity: Not on file   Other Topics Concern  . Not on file   Social History Narrative   Has 3 caffeinated bev a day   Current Outpatient Prescriptions on File Prior to Visit  Medication Sig Dispense Refill  . acetaminophen (TYLENOL) 500 MG tablet Take 500 mg by mouth every 6 (six) hours as needed for  pain.    Marland Kitchen amLODipine (NORVASC) 5 MG tablet TAKE 1 BY MOUTH TWICE DAILY 180 tablet 1  . B Complex-C (SUPER B COMPLEX PO) Take 1 tablet by mouth daily.     . Cholecalciferol (VITAMIN D3) 2000 UNITS TABS Take 2,000 Units by mouth daily.     . diazepam (VALIUM) 5 MG tablet TAKE 1 TABLET AT BEDTIME AS NEEDED (SEE NEW DR. FOR FURTHER REFILLS) 30 tablet 0  . fexofenadine (ALLEGRA) 180 MG tablet Take 180 mg by mouth at bedtime.     . furosemide (LASIX) 40 MG tablet TAKE 1/2 TO 1 BY MOUTH DAILY AS NEEDED FOR EDEMA 90 tablet 0  . glucose blood (ONE TOUCH ULTRA TEST) test strip CHECK BLOOD SUGAR ONCE DAILY Dx 250.42 100 each 3  . labetalol (NORMODYNE) 300 MG tablet TAKE 1 BY MOUTH TWICE DAILY (GENERIC FOR NORMODYNE) 180 tablet 2  . Liniments (SALONPAS) PADS Apply 1 each topically every 8 (eight) hours as needed (pain).    . Loperamide HCl (IMODIUM PO) Take 1 tablet by mouth as needed (for loose stools).    . meloxicam (MOBIC) 7.5 MG tablet TAKE 1 TABLET BY MOUTH EVERY DAY 30 tablet 3  . Menthol, Topical Analgesic, (BIOFREEZE EX) Apply 1 application topically 2 (two) times daily as needed (back pain).    . metFORMIN (GLUCOPHAGE) 500 MG tablet TAKE 1 TABLET BY MOUTH IN THE EVENING WITH MEAL 30 tablet 2  . pioglitazone (ACTOS) 15 MG tablet Take 15 mg by mouth daily.    . pravastatin (PRAVACHOL) 40 MG tablet TAKE 1 BY MOUTH DAILY 90 tablet 2  . traMADol (ULTRAM) 50 MG tablet at bedtime. Pt is only taking one Tab.  0  . valsartan (DIOVAN) 320 MG tablet TAKE 1 BY MOUTH AT BEDTIME 90 tablet 3  . vitamin E 400 UNIT capsule Take 400 Units by mouth daily.     No current facility-administered medications on file prior to visit.   Allergies  Allergen Reactions  . Amoxicillin     REACTION: rash  . Rocephin [Ceftriaxone Sodium In Dextrose]     10/24 /14 blisters of palms & diffuse rash Because of a history of documented adverse serious drug reaction;Medi Alert bracelet  is recommended  . Captopril      REACTION: cough  . Simvastatin     REACTION: buttocks cramps  . Tramadol Other (See Comments)    Stomach cramps, mouth sores- Pt was taking TWO tablets. She does not have any problems with taking ONE tablet at bedtime.  . Adhesive [Tape] Rash   Family History  Problem Relation Age of Onset  . Coronary artery disease    .  Diabetes    . Heart failure Mother   . Heart disease Mother   . Hypertension Mother   . Other Mother     varicose veins  . Deep vein thrombosis Mother   . Varicose Veins Mother   . Heart attack Mother   . Hypertension Sister     X 2  . Diabetes Sister   . Hyperlipidemia Sister   . Other Sister     varicose veins  . Heart disease Father   . Diabetes Father   . Hyperlipidemia Father   . Hypertension Father   . Heart attack Father   . Heart disease Brother     MI in late 47s  . Hyperlipidemia Brother   . Heart attack Brother   . Parkinsonism Brother   . Heart failure Brother   . Hypertension Daughter    PE: BP 102/60 mmHg  Pulse 85  Temp(Src) 97.4 F (36.3 C) (Oral)  Resp 12  Wt 170 lb 9.6 oz (77.384 kg)  SpO2 95% Body mass index is 28.39 kg/(m^2). Wt Readings from Last 3 Encounters:  10/23/15 170 lb 9.6 oz (77.384 kg)  09/26/15 169 lb 12 oz (76.998 kg)  06/26/15 168 lb (76.204 kg)   Constitutional: slightly overweight, in NAD Eyes: PERRLA, EOMI, no exophthalmos ENT: moist mucous membranes, no thyromegaly, no cervical lymphadenopathy Cardiovascular: RRR, No MRG, + mild periankle swelling Respiratory: CTA B Gastrointestinal: abdomen soft, NT, ND, BS+ Musculoskeletal: no deformities, strength intact in all 4 Skin: moist, warm, no rashes Neurological: no tremor with outstretched hands, DTR normal in all 4  ASSESSMENT: 1. DM2, non-insulin-dependent, controlled, with complications - CKD stage 3  PLAN:  1. Patient with long-standing, uncontrolled diabetes, on oral antidiabetic regimen, with Actos, low dose, which is causing weight gain. Pt  would like to stop Actos and use only Metformin, for which she has been cleared by her nephrologist. Her HbA1c has been <7% in the last months. We will stop Actos and increase Metformin to 500 mg bid. I am not sure if this is enough >> advised her to let me know about her sugars in 2 weeks. We may need a low dose Glipizide or a DPP4 inh. Then. - I suggested to:  Patient Instructions  Please continue Metformin, increasing to 500 mg 2x a day with meals. Stop Actos.  Check sugars every day, rotating check times.  Please let me know if the sugars are consistently <80 or >180.  Please let me know if sugars are high in 2 weeks.  Please come back for a follow-up appointment in 2 months.  - Strongly advised her to start checking sugars at different times of the day - check once a day, rotating checks - given sugar log and advised how to fill it and to bring it at next appt  - given foot care handout and explained the principles  - given instructions for hypoglycemia management "15-15 rule"  - advised for yearly eye exams  - Return to clinic in 2 mo with sugar log

## 2015-10-23 NOTE — Patient Instructions (Addendum)
Please continue Metformin, increasing to 500 mg 2x a day with meals. Stop Actos.  Check sugars every day, rotating check times.  Please let me know if the sugars are consistently <80 or >180.  Please let me know if sugars are high in 2 weeks.  Please come back for a follow-up appointment in 3 months.  PATIENT INSTRUCTIONS FOR TYPE 2 DIABETES:  DIET AND EXERCISE Diet and exercise is an important part of diabetic treatment.  We recommended aerobic exercise in the form of brisk walking (working between 40-60% of maximal aerobic capacity, similar to brisk walking) for 150 minutes per week (such as 30 minutes five days per week) along with 3 times per week performing 'resistance' training (using various gauge rubber tubes with handles) 5-10 exercises involving the major muscle groups (upper body, lower body and core) performing 10-15 repetitions (or near fatigue) each exercise. Start at half the above goal but build slowly to reach the above goals. If limited by weight, joint pain, or disability, we recommend daily walking in a swimming pool with water up to waist to reduce pressure from joints while allow for adequate exercise.    BLOOD GLUCOSES Monitoring your blood glucoses is important for continued management of your diabetes. Please check your blood glucoses 2-4 times a day: fasting, before meals and at bedtime (you can rotate these measurements - e.g. one day check before the 3 meals, the next day check before 2 of the meals and before bedtime, etc.).   HYPOGLYCEMIA (low blood sugar) Hypoglycemia is usually a reaction to not eating, exercising, or taking too much insulin/ other diabetes drugs.  Symptoms include tremors, sweating, hunger, confusion, headache, etc. Treat IMMEDIATELY with 15 grams of Carbs: . 4 glucose tablets .  cup regular juice/soda . 2 tablespoons raisins . 4 teaspoons sugar . 1 tablespoon honey Recheck blood glucose in 15 mins and repeat above if still  symptomatic/blood glucose <100.  RECOMMENDATIONS TO REDUCE YOUR RISK OF DIABETIC COMPLICATIONS: * Take your prescribed MEDICATION(S) * Follow a DIABETIC diet: Complex carbs, fiber rich foods, (monounsaturated and polyunsaturated) fats * AVOID saturated/trans fats, high fat foods, >2,300 mg salt per day. * EXERCISE at least 5 times a week for 30 minutes or preferably daily.  * DO NOT SMOKE OR DRINK more than 1 drink a day. * Check your FEET every day. Do not wear tightfitting shoes. Contact us if you develop an ulcer * See your EYE doctor once a year or more if needed * Get a FLU shot once a year * Get a PNEUMONIA vaccine once before and once after age 11 years  GOALS:  * Your Hemoglobin A1c of <7%  * fasting sugars need to be <130 * after meals sugars need to be <180 (2h after you start eating) * Your Systolic BP should be [id number] or lower  * Your Diastolic BP should be 80 or lower  * Your HDL (Good Cholesterol) should be 40 or higher  * Your LDL (Bad Cholesterol) should be 100 or lower. * Your Triglycerides should be 150 or lower  * Your Urine microalbumin (kidney function) should be <30 * Your Body Mass Index should be 25 or lower    Please consider the following ways to cut down carbs and fat and increase fiber and micronutrients in your diet: - substitute whole grain for white bread or pasta - substitute brown rice for white rice - substitute 90-calorie flat bread pieces for slices of bread when possible - substitute sweet  potatoes or yams for white potatoes - substitute humus for margarine - substitute tofu for cheese when possible - substitute almond or rice milk for regular milk (would not drink soy milk daily due to concern for soy estrogen influence on breast cancer risk) - substitute dark chocolate for other sweets when possible - substitute water - can add lemon or orange slices for taste - for diet sodas (artificial sweeteners will trick your body that you can eat sweets  without getting calories and will lead you to overeating and weight gain in the long run) - do not skip breakfast or other meals (this will slow down the metabolism and will result in more weight gain over time)  - can try smoothies made from fruit and almond/rice milk in am instead of regular breakfast - can also try old-fashioned (not instant) oatmeal made with almond/rice milk in am - order the dressing on the side when eating salad at a restaurant (pour less than half of the dressing on the salad) - eat as little meat as possible - can try juicing, but should not forget that juicing will get rid of the fiber, so would alternate with eating raw veg./fruits or drinking smoothies - use as little oil as possible, even when using olive oil - can dress a salad with a mix of balsamic vinegar and lemon juice, for e.g. - use agave nectar, stevia sugar, or regular sugar rather than artificial sweateners - steam or broil/roast veggies  - snack on veggies/fruit/nuts (unsalted, preferably) when possible, rather than processed foods - reduce or eliminate aspartame in diet (it is in diet sodas, chewing gum, etc) Read the labels!  Try to read Dr. Janene Harvey book: "Program for Reversing Diabetes" for other ideas for healthy eating.

## 2015-10-23 NOTE — Telephone Encounter (Signed)
Test strips need Dx code included in the rx.

## 2015-10-25 ENCOUNTER — Ambulatory Visit
Admission: RE | Admit: 2015-10-25 | Discharge: 2015-10-25 | Disposition: A | Discharge status: Home / Self Care | Payer: Medicare Other | Source: Ambulatory Visit | Attending: Nephrology | Admitting: Nephrology

## 2015-10-25 DIAGNOSIS — N183 Chronic kidney disease, stage 3 unspecified: Secondary | ICD-10-CM

## 2015-11-07 ENCOUNTER — Telehealth: Payer: Self-pay | Admitting: Internal Medicine

## 2015-11-09 MED ORDER — DIAZEPAM 5 MG PO TABS: ORAL_TABLET | ORAL | Status: DC

## 2015-11-09 NOTE — Telephone Encounter (Signed)
Done, faxed to pharmacy

## 2015-11-09 NOTE — Addendum Note (Signed)
Addended by: Levonne Lapping on: 11/09/2015 04:10 PM   Modules accepted: Orders

## 2015-11-09 NOTE — Telephone Encounter (Signed)
OK X1 

## 2015-11-09 NOTE — Telephone Encounter (Signed)
She wanted to check the status of this script?

## 2015-11-21 ENCOUNTER — Telehealth: Payer: Self-pay | Admitting: Emergency Medicine

## 2015-11-21 MED ORDER — FUROSEMIDE 40 MG PO TABS: ORAL_TABLET | ORAL | Status: DC

## 2015-11-21 MED ORDER — AMLODIPINE BESYLATE 5 MG PO TABS: ORAL_TABLET | ORAL | Status: DC

## 2015-11-21 MED ORDER — LABETALOL HCL 300 MG PO TABS: ORAL_TABLET | ORAL | Status: DC

## 2015-11-21 MED ORDER — VALSARTAN 320 MG PO TABS: ORAL_TABLET | ORAL | Status: DC

## 2015-11-21 MED ORDER — PRAVASTATIN SODIUM 40 MG PO TABS: ORAL_TABLET | ORAL | Status: DC

## 2015-12-28 ENCOUNTER — Telehealth: Payer: Self-pay | Admitting: *Deleted

## 2015-12-28 MED ORDER — OSELTAMIVIR PHOSPHATE 75 MG PO CAPS: 75.0000 mg | ORAL_CAPSULE | Freq: Every day | ORAL | Status: DC

## 2015-12-28 NOTE — Telephone Encounter (Signed)
Medication sent to pharmacy  

## 2015-12-28 NOTE — Telephone Encounter (Signed)
Notified pt rx resent to pharmacy...Sheila Melton

## 2015-12-28 NOTE — Telephone Encounter (Signed)
Left msg on triage stating her grandson has been dx with the flu, and his pedetrician told us to contact pcp to have rx for Tamiflu for preventive since he is staying with them. MD out of office pls advise...Johny Chess

## 2015-12-31 ENCOUNTER — Other Ambulatory Visit (INDEPENDENT_AMBULATORY_CARE_PROVIDER_SITE_OTHER): Payer: Medicare Other | Admitting: *Deleted

## 2015-12-31 ENCOUNTER — Encounter: Payer: Self-pay | Admitting: Internal Medicine

## 2015-12-31 ENCOUNTER — Ambulatory Visit (INDEPENDENT_AMBULATORY_CARE_PROVIDER_SITE_OTHER): Payer: Medicare Other | Admitting: Internal Medicine

## 2015-12-31 ENCOUNTER — Other Ambulatory Visit: Payer: Self-pay | Admitting: *Deleted

## 2015-12-31 VITALS — BP 122/60 | HR 86 | Temp 98.2°F | Resp 12 | Wt 165.0 lb

## 2015-12-31 DIAGNOSIS — E1159 Type 2 diabetes mellitus with other circulatory complications: Secondary | ICD-10-CM | POA: Diagnosis not present

## 2015-12-31 DIAGNOSIS — E1129 Type 2 diabetes mellitus with other diabetic kidney complication: Secondary | ICD-10-CM | POA: Diagnosis not present

## 2015-12-31 DIAGNOSIS — E1165 Type 2 diabetes mellitus with hyperglycemia: Secondary | ICD-10-CM

## 2015-12-31 DIAGNOSIS — IMO0002 Reserved for concepts with insufficient information to code with codable children: Secondary | ICD-10-CM

## 2015-12-31 LAB — POCT GLYCOSYLATED HEMOGLOBIN (HGB A1C): Hemoglobin A1C: 6.9

## 2015-12-31 MED ORDER — GLUCOSE BLOOD VI STRP
ORAL_STRIP | Status: DC
Start: 2015-12-31 — End: 2017-01-14

## 2015-12-31 NOTE — Progress Notes (Signed)
Patient ID: Sheila Melton, female   DOB: 07/27/1941, 75 y.o.   MRN: [id number]  HPI: [name] is a 75 y.o.-year-old female, returning for follow-up for  DM2, dx in ~2006, non-insulin-dependent, controlled, with complications (stage 3 CKD, circulatory complications - AAA). Last visit 2 months ago.  Last hemoglobin A1c was: Lab Results  Component Value Date   HGBA1C 6.9* 09/26/2015   HGBA1C 6.5 02/23/2015   HGBA1C 6.9* 11/16/2014   Pt is on a regimen of: - Metformin 500 mg daily at dinnertime >> bid (increased  10/2015) We stopped Actos 15 mg daily in 10/2015 as she felt that she gained 20 pounds after she started Actos. She was previously on metformin, but this was stopped due to her CKD. She was recently cleared by her nephrologist, Dr. Posey Pronto to restart metformin.   Pt checks her sugars 1x a day and they are: - am: 135-140 >> 133-154 - 2h after b'fast: n/c >> 134-167 - before lunch: n/c >> 137 - 2h after lunch: n/c >> 124-164 - before dinner: n/c >> 118-135, 174 - 2h after dinner: n/c >> 151-185 - bedtime: n/c >> 128-164 - nighttime: n/c No lows. Lowest sugar was 135; ? hypoglycemia awareness.  Highest sugar was 140.  Glucometer: One Touch   Pt's meals are: - Breakfast: cereals, bacon + eggs - Lunch: cheese crackers - Dinner: meat + veggies - Snacks: 1 or 2  - + CKD stage 3, last BUN/creatinine:  Lab Results  Component Value Date   BUN 17 09/26/2015   CREATININE 1.05 09/26/2015  GFR 54. She had the lower GFR before, but this has been in the context of contrast substance. On Diovan. - last set of lipids: Lab Results  Component Value Date   CHOL 245* 09/26/2015   HDL 40.90 09/26/2015   LDLCALC 123* 05/24/2014   LDLDIRECT 118.0 09/26/2015   TRIG 322.0* 09/26/2015   CHOLHDL 6 09/26/2015  On Pravastatin. - last eye exam was 09/2014. No DR. Dr Idolina Primer.   - no numbness and tingling in her feet.  She also has HTN, HL.  ROS: Constitutional: + weight loss, no   fatigue, + subjective hypothermia Eyes: no blurry vision, no xerophthalmia ENT: no sore throat, no nodules palpated in throat, no dysphagia/odynophagia, no hoarseness Cardiovascular: no CP/SOB/palpitations/leg swelling Respiratory: no cough/SOB Gastrointestinal: no N/V/D/C Musculoskeletal: no muscle/joint aches, + back pain Skin: no rashes Neurological: no tremors/numbness/tingling/dizziness  I reviewed pt's medications, allergies, PMH, social hx, family hx, and changes were documented in the history of present illness. Otherwise, unchanged from my initial visit note.  Past Medical History  Diagnosis Date  . Hyperlipidemia   . Diabetes mellitus     2  . Hypertension     unspecified essential  . Family history of anesthesia complication     PATIENTS MOM HAD TROUBLE WAKING UP   . Heart murmur   . Subclavian steal syndrome   . Subclavian steal syndrome     LEFT SIDE   NO SIDE EFFECTS  . AAA (abdominal aortic aneurysm) (HCC)     BEING WATCHED   [facility]   . Arthritis   . CAP (community acquired pneumonia) 10/24-27/14     with SIRS  . UTI (lower urinary tract infection) 08/26/13    Enterococcus and Escherichia coli both sensitive to nitrofurantoin   Past Surgical History  Procedure Laterality Date  . Cataract extraction      OS  . G1 p1    . Abdominal hysterectomy    .  Dilation and curettage of uterus    . Cyst excision Right 05-23-13    Right index finger: cyst  . Spine surgery  Sept. 2013  . Eye surgery     Social History   Social History  . Marital Status: Married    Spouse Name: N/A  . Number of Children: 1   Occupational History  . retied   Social History Main Topics  . Smoking status: Current Some Day Smoker -- 1.00 packs/day for 50 years    Types: E-cigarettes  . Smokeless tobacco: Never Used  . Alcohol Use: No  . Drug Use: No  . Sexual Activity: Not on file   Other Topics Concern  . Not on file   Social History Narrative   Has 3 caffeinated bev a day    Current Outpatient Prescriptions on File Prior to Visit  Medication Sig Dispense Refill  . acetaminophen (TYLENOL) 500 MG tablet Take 500 mg by mouth every 6 (six) hours as needed for pain.    Marland Kitchen amLODipine (NORVASC) 5 MG tablet TAKE 1 BY MOUTH TWICE DAILY 180 tablet 2  . B Complex-C (SUPER B COMPLEX PO) Take 1 tablet by mouth daily.     . Cholecalciferol (VITAMIN D3) 2000 UNITS TABS Take 2,000 Units by mouth daily.     . diazepam (VALIUM) 5 MG tablet TAKE 1 TABLET AT BEDTIME AS NEEDED (SEE NEW DR. FOR FURTHER REFILLS) 30 tablet 1  . fexofenadine (ALLEGRA) 180 MG tablet Take 180 mg by mouth at bedtime.     . furosemide (LASIX) 40 MG tablet TAKE 1/2 TO 1 BY MOUTH DAILY AS NEEDED FOR EDEMA 90 tablet 1  . glucose blood (ONE TOUCH ULTRA TEST) test strip CHECK BLOOD SUGAR ONCE DAILY. DX: E11.59 100 each 3  . labetalol (NORMODYNE) 300 MG tablet TAKE 1 BY MOUTH TWICE DAILY (GENERIC FOR NORMODYNE) 180 tablet 2  . Liniments (SALONPAS) PADS Apply 1 each topically every 8 (eight) hours as needed (pain).    . Loperamide HCl (IMODIUM PO) Take 1 tablet by mouth as needed (for loose stools).    . meloxicam (MOBIC) 7.5 MG tablet TAKE 1 TABLET BY MOUTH EVERY DAY 30 tablet 3  . Menthol, Topical Analgesic, (BIOFREEZE EX) Apply 1 application topically 2 (two) times daily as needed (back pain).    . metFORMIN (GLUCOPHAGE) 500 MG tablet TAKE 1 tablet by mouth 2x a day with meals 180 tablet 1  . oseltamivir (TAMIFLU) 75 MG capsule Take 1 capsule (75 mg total) by mouth daily. 10 capsule 0  . pravastatin (PRAVACHOL) 40 MG tablet TAKE 1 BY MOUTH DAILY 90 tablet 2  . traMADol (ULTRAM) 50 MG tablet at bedtime. Pt is only taking one Tab.  0  . valsartan (DIOVAN) 320 MG tablet TAKE 1 BY MOUTH AT BEDTIME 90 tablet 2  . vitamin E 400 UNIT capsule Take 400 Units by mouth daily.     No current facility-administered medications on file prior to visit.   Allergies  Allergen Reactions  . Amoxicillin     REACTION: rash  .  Rocephin [Ceftriaxone Sodium In Dextrose]     10/24 /14 blisters of palms & diffuse rash Because of a history of documented adverse serious drug reaction;Medi Alert bracelet  is recommended  . Captopril     REACTION: cough  . Simvastatin     REACTION: buttocks cramps  . Tramadol Other (See Comments)    Stomach cramps, mouth sores- Pt was taking TWO tablets. She does  not have any problems with taking ONE tablet at bedtime.  . Adhesive [Tape] Rash   Family History  Problem Relation Age of Onset  . Coronary artery disease    . Diabetes    . Heart failure Mother   . Heart disease Mother   . Hypertension Mother   . Other Mother     varicose veins  . Deep vein thrombosis Mother   . Varicose Veins Mother   . Heart attack Mother   . Hypertension Sister     X 2  . Diabetes Sister   . Hyperlipidemia Sister   . Other Sister     varicose veins  . Heart disease Father   . Diabetes Father   . Hyperlipidemia Father   . Hypertension Father   . Heart attack Father   . Heart disease Brother     MI in late 54s  . Hyperlipidemia Brother   . Heart attack Brother   . Parkinsonism Brother   . Heart failure Brother   . Hypertension Daughter    PE: BP 122/60 mmHg  Pulse 86  Temp(Src) 98.2 F (36.8 C) (Oral)  Resp 12  Wt 165 lb (74.844 kg)  SpO2 95% There is no weight on file to calculate BMI. Wt Readings from Last 3 Encounters:  12/31/15 165 lb (74.844 kg)  10/23/15 170 lb 9.6 oz (77.384 kg)  09/26/15 169 lb 12 oz (76.998 kg)   Constitutional: slightly overweight, in NAD Eyes: PERRLA, EOMI, no exophthalmos ENT: moist mucous membranes, no thyromegaly, no cervical lymphadenopathy Cardiovascular: RRR, No MRG, + mild periankle swelling Respiratory: CTA B Gastrointestinal: abdomen soft, NT, ND, BS+ Musculoskeletal: no deformities, strength intact in all 4 Skin: moist, warm, no rashes Neurological: no tremor with outstretched hands, DTR normal in all 4  ASSESSMENT: 1. DM2,  non-insulin-dependent, controlled, with complications - CKD stage 3  PLAN:  1. Patient with long-standing, uncontrolled diabetes, on oral antidiabetic regimen, Previously Actos, low dose, which was causing weight gain. We stopped Actos and increased Metformin to 500 mg bid, for which she has been cleared by her nephrologist. Her HbA1c has been <7% in the last months.  We may need a low dose Glipizide or a DPP4 inh., but not needed for now as sugars are still close to normal ranges. However, they are higher in am >> advised her to try to move all Metformin at dinnertime: - I suggested to:  Patient Instructions  Please try to move all Metformin at dinnertime (1000 mg).  Please return in 4 months with your sugar log.   - continue checking sugars at different times of the day - check once a day, rotating checks - advised for yearly eye exams >> she is due for a dilated eye exam - checked HbA1c today >> 6.9% (stable, at goal) - Return to clinic in 4 mo with sugar log

## 2015-12-31 NOTE — Patient Instructions (Signed)
Please try to move all Metformin at dinnertime (1000 mg).  Please return in 4 months with your sugar log.

## 2016-01-03 ENCOUNTER — Other Ambulatory Visit (HOSPITAL_COMMUNITY): Payer: Self-pay | Admitting: Nephrology

## 2016-01-03 DIAGNOSIS — N183 Chronic kidney disease, stage 3 unspecified: Secondary | ICD-10-CM

## 2016-01-08 ENCOUNTER — Other Ambulatory Visit: Payer: Self-pay | Admitting: Internal Medicine

## 2016-01-09 ENCOUNTER — Other Ambulatory Visit: Payer: Self-pay | Admitting: Internal Medicine

## 2016-01-09 ENCOUNTER — Telehealth: Payer: Self-pay | Admitting: Internal Medicine

## 2016-01-09 NOTE — Telephone Encounter (Signed)
Patient has made appt to get established with you on 01/18/16 at 11am----please advise on valium rx refill request, thanks

## 2016-01-09 NOTE — Telephone Encounter (Signed)
rx faxed to patient pharm [phone]

## 2016-01-09 NOTE — Telephone Encounter (Signed)
rx printed

## 2016-01-10 ENCOUNTER — Other Ambulatory Visit: Payer: Self-pay | Admitting: Radiology

## 2016-01-10 MED ORDER — DIAZEPAM 5 MG PO TABS: ORAL_TABLET | ORAL | Status: DC

## 2016-01-10 NOTE — Telephone Encounter (Signed)
Faxed to pharm 

## 2016-01-10 NOTE — Addendum Note (Signed)
Addended by: Binnie Rail on: 01/10/2016 12:16 PM   Modules accepted: Orders

## 2016-01-10 NOTE — Telephone Encounter (Signed)
rx printed

## 2016-01-11 ENCOUNTER — Encounter (HOSPITAL_COMMUNITY): Payer: Self-pay

## 2016-01-11 ENCOUNTER — Ambulatory Visit (HOSPITAL_COMMUNITY)
Admission: RE | Admit: 2016-01-11 | Discharge: 2016-01-11 | Disposition: A | Discharge status: Home / Self Care | Payer: Medicare Other | Source: Ambulatory Visit | Attending: Nephrology | Admitting: Nephrology

## 2016-01-11 DIAGNOSIS — Z7984 Long term (current) use of oral hypoglycemic drugs: Secondary | ICD-10-CM | POA: Diagnosis not present

## 2016-01-11 DIAGNOSIS — Z888 Allergy status to other drugs, medicaments and biological substances status: Secondary | ICD-10-CM | POA: Diagnosis not present

## 2016-01-11 DIAGNOSIS — Z8249 Family history of ischemic heart disease and other diseases of the circulatory system: Secondary | ICD-10-CM | POA: Insufficient documentation

## 2016-01-11 DIAGNOSIS — Z881 Allergy status to other antibiotic agents status: Secondary | ICD-10-CM | POA: Insufficient documentation

## 2016-01-11 DIAGNOSIS — F17210 Nicotine dependence, cigarettes, uncomplicated: Secondary | ICD-10-CM | POA: Diagnosis not present

## 2016-01-11 DIAGNOSIS — E785 Hyperlipidemia, unspecified: Secondary | ICD-10-CM | POA: Insufficient documentation

## 2016-01-11 DIAGNOSIS — I714 Abdominal aortic aneurysm, without rupture: Secondary | ICD-10-CM | POA: Diagnosis not present

## 2016-01-11 DIAGNOSIS — Z79899 Other long term (current) drug therapy: Secondary | ICD-10-CM | POA: Insufficient documentation

## 2016-01-11 DIAGNOSIS — N183 Chronic kidney disease, stage 3 unspecified: Secondary | ICD-10-CM

## 2016-01-11 DIAGNOSIS — Z833 Family history of diabetes mellitus: Secondary | ICD-10-CM | POA: Insufficient documentation

## 2016-01-11 DIAGNOSIS — Z880 Allergy status to penicillin: Secondary | ICD-10-CM | POA: Insufficient documentation

## 2016-01-11 DIAGNOSIS — E1122 Type 2 diabetes mellitus with diabetic chronic kidney disease: Secondary | ICD-10-CM | POA: Diagnosis not present

## 2016-01-11 DIAGNOSIS — I129 Hypertensive chronic kidney disease with stage 1 through stage 4 chronic kidney disease, or unspecified chronic kidney disease: Secondary | ICD-10-CM | POA: Diagnosis not present

## 2016-01-11 LAB — CBC
HCT: 37.7 % (ref 36.0–46.0)
HEMOGLOBIN: 12.7 g/dL (ref 12.0–15.0)
MCH: 29.2 pg (ref 26.0–34.0)
MCHC: 33.7 g/dL (ref 30.0–36.0)
MCV: 86.7 fL (ref 78.0–100.0)
PLATELETS: 330 10*3/uL (ref 150–400)
RBC: 4.35 MIL/uL (ref 3.87–5.11)
RDW: 13.9 % (ref 11.5–15.5)
WBC: 10.9 10*3/uL — AB (ref 4.0–10.5)

## 2016-01-11 LAB — PROTIME-INR
INR: 0.97 (ref 0.00–1.49)
PROTHROMBIN TIME: 13.1 s (ref 11.6–15.2)

## 2016-01-11 LAB — GLUCOSE, CAPILLARY
GLUCOSE-CAPILLARY: 145 mg/dL — AB (ref 65–99)
GLUCOSE-CAPILLARY: 163 mg/dL — AB (ref 65–99)

## 2016-01-11 LAB — APTT: aPTT: 26 seconds (ref 24–37)

## 2016-01-11 MED ORDER — MIDAZOLAM HCL 2 MG/2ML IJ SOLN
INTRAMUSCULAR | Status: AC
  Filled 2016-01-11: qty 2

## 2016-01-11 MED ORDER — FENTANYL CITRATE (PF) 100 MCG/2ML IJ SOLN
INTRAMUSCULAR | Status: AC | PRN
  Administered 2016-01-11: 50 ug via INTRAVENOUS

## 2016-01-11 MED ORDER — FENTANYL CITRATE (PF) 100 MCG/2ML IJ SOLN
INTRAMUSCULAR | Status: AC
  Filled 2016-01-11: qty 2

## 2016-01-11 MED ORDER — HYDROCODONE-ACETAMINOPHEN 5-325 MG PO TABS: 1.0000 | ORAL_TABLET | ORAL | Status: DC | PRN

## 2016-01-11 MED ORDER — MIDAZOLAM HCL 2 MG/2ML IJ SOLN
INTRAMUSCULAR | Status: AC | PRN
  Administered 2016-01-11: 0.5 mg via INTRAVENOUS
  Administered 2016-01-11: 1 mg via INTRAVENOUS

## 2016-01-11 MED ORDER — LIDOCAINE HCL (PF) 1 % IJ SOLN
INTRAMUSCULAR | Status: AC
  Filled 2016-01-11: qty 10

## 2016-01-11 MED ORDER — SODIUM CHLORIDE 0.9 % IV SOLN: Freq: Once | INTRAVENOUS | Status: DC

## 2016-01-11 NOTE — Procedures (Signed)
[employer] renal core bx 16g x2 to surg path No complication No blood loss. See complete dictation in [facility].

## 2016-01-11 NOTE — Discharge Instructions (Signed)
Kidney Biopsy, Care After °Refer to this sheet in the next few weeks. These instructions provide you with information on caring for yourself after your procedure. Your health care provider may also give you more specific instructions. Your treatment has been planned according to current medical practices, but problems sometimes occur. Call your health care provider if you have any problems or questions after your procedure.  °WHAT TO EXPECT AFTER THE PROCEDURE  °· You may notice blood in the urine for the first 24 hours after the biopsy. °· You may feel some pain at the biopsy site for 1-2 weeks after the biopsy. °HOME CARE INSTRUCTIONS °· Do not lift anything heavier than 10 lb (4.5 kg) for 2 weeks. °· Do not take any non-steroidal anti-inflammatory drugs (NSAIDs) or any blood thinners for a week after the biopsy unless instructed to do so by your health care provider. °· Only take medicines for pain, fever, or discomfort as directed by your health care provider. °SEEK MEDICAL CARE IF: °· You have bloody urine more than 24 hours after the biopsy.   °· You develop a fever.   °· You cannot urinate.   °· You have increasing pain at the biopsy site.   °SEEK IMMEDIATE MEDICAL CARE IF: °You feel faint or dizzy.  °  °This information is not intended to replace advice given to you by your health care provider. Make sure you discuss any questions you have with your health care provider. °  °Document Released: 06/29/2013 Document Reviewed: 06/29/2013 °Elsevier Interactive Patient Education ©2016 [employer] Inc. ° °

## 2016-01-11 NOTE — H&P (Signed)
Chief Complaint: Patient was seen in consultation today for random renal biopsy at the request of Patel,Jay  Referring Physician(s): Patel,Jay  Supervising Physician: Arne Cleveland  History of Present Illness: Sheila Melton is a 75 y.o. female   Chronic renal disease stage 3a Followed by Dr Posey Pronto Nephrotic range proteinuria Request for random renal bx  Past Medical History  Diagnosis Date  . Hyperlipidemia   . Diabetes mellitus     2  . Hypertension     unspecified essential  . Family history of anesthesia complication     PATIENTS MOM HAD TROUBLE WAKING UP   . Heart murmur   . Subclavian steal syndrome   . Subclavian steal syndrome     LEFT SIDE   NO SIDE EFFECTS  . AAA (abdominal aortic aneurysm) (HCC)     BEING WATCHED   [facility]   . Arthritis   . CAP (community acquired pneumonia) 10/24-27/14     with SIRS  . UTI (lower urinary tract infection) 08/26/13    Enterococcus and Escherichia coli both sensitive to nitrofurantoin    Past Surgical History  Procedure Laterality Date  . Cataract extraction      OS  . G1 p1    . Abdominal hysterectomy    . Dilation and curettage of uterus    . Cyst excision Right 05-23-13    Right index finger: cyst  . Spine surgery  Sept. 2013  . Eye surgery      Allergies: Amoxicillin; Rocephin; Captopril; Simvastatin; and Adhesive  Medications: Prior to Admission medications   Medication Sig Start Date End Date Taking? Authorizing Provider  acetaminophen (TYLENOL) 500 MG tablet Take 500 mg by mouth every 6 (six) hours as needed for pain.   Yes Historical Provider, MD  amLODipine (NORVASC) 5 MG tablet TAKE 1 BY MOUTH TWICE DAILY 11/21/15  Yes Binnie Rail, MD  B Complex-C (SUPER B COMPLEX PO) Take 1 tablet by mouth daily.    Yes Historical Provider, MD  Cholecalciferol (VITAMIN D3) 2000 UNITS TABS Take 2,000 Units by mouth daily.    Yes Historical Provider, MD  diazepam (VALIUM) 5 MG tablet TAKE 1 TABLET AT BEDTIME AS NEEDED.  01/10/16  Yes Binnie Rail, MD  fexofenadine (ALLEGRA) 180 MG tablet Take 180 mg by mouth at bedtime.    Yes Historical Provider, MD  furosemide (LASIX) 40 MG tablet TAKE 1/2 TO 1 BY MOUTH DAILY AS NEEDED FOR EDEMA 11/21/15  Yes Binnie Rail, MD  glucose blood (ONE TOUCH ULTRA TEST) test strip Use to test blood sugar once daily as instructed. Dx: E11.59 12/31/15  Yes Philemon Kingdom, MD  labetalol (NORMODYNE) 300 MG tablet TAKE 1 BY MOUTH TWICE DAILY ([place]) 11/21/15  Yes Binnie Rail, MD  Liniments [facility]) PADS Apply 1 each topically every 8 (eight) hours as needed (pain).   Yes Historical Provider, MD  Loperamide HCl (IMODIUM PO) Take 1 tablet by mouth as needed (for loose stools).   Yes Historical Provider, MD  meloxicam (MOBIC) 7.5 MG tablet TAKE 1 TABLET BY MOUTH EVERY DAY 09/18/15  Yes Hendricks Limes, MD  Menthol, Topical Analgesic, (BIOFREEZE EX) Apply 1 application topically 2 (two) times daily as needed (back pain).   Yes Historical Provider, MD  metFORMIN (GLUCOPHAGE) 500 MG tablet TAKE 1 tablet by mouth 2x a day with meals Patient taking differently: Take 500 mg by mouth 2 (two) times daily with a meal. TAKE 1 tablet by mouth  2x a day with meals 10/23/15  Yes Philemon Kingdom, MD  pravastatin (PRAVACHOL) 40 MG tablet TAKE 1 BY MOUTH DAILY 11/21/15  Yes Binnie Rail, MD  traMADol (ULTRAM) 50 MG tablet Take 50 mg by mouth 2 (two) times daily as needed.  05/27/15  Yes Historical Provider, MD  valsartan (DIOVAN) 320 MG tablet TAKE 1 BY MOUTH AT BEDTIME 11/21/15  Yes Binnie Rail, MD  vitamin E 400 UNIT capsule Take 400 Units by mouth daily.   Yes Historical Provider, MD     Family History  Problem Relation Age of Onset  . Coronary artery disease    . Diabetes    . Heart failure Mother   . Heart disease Mother   . Hypertension Mother   . Other Mother     varicose veins  . Deep vein thrombosis Mother   . Varicose Veins Mother   . Heart attack Mother   .  Hypertension Sister     X 2  . Diabetes Sister   . Hyperlipidemia Sister   . Other Sister     varicose veins  . Heart disease Father   . Diabetes Father   . Hyperlipidemia Father   . Hypertension Father   . Heart attack Father   . Heart disease Brother     MI in late 78s  . Hyperlipidemia Brother   . Heart attack Brother   . Parkinsonism Brother   . Heart failure Brother   . Hypertension Daughter     Social History   Social History  . Marital Status: Married    Spouse Name: N/A  . Number of Children: N/A  . Years of Education: N/A   Social History Main Topics  . Smoking status: Current Some Day Smoker -- 1.00 packs/day for 50 years    Types: E-cigarettes  . Smokeless tobacco: Never Used  . Alcohol Use: No  . Drug Use: No  . Sexual Activity: Not Asked   Other Topics Concern  . None   Social History Narrative   Has 3 caffeinated bev a day    Review of Systems: A 12 point ROS discussed and pertinent positives are indicated in the HPI above.  All other systems are negative.  Review of Systems  Constitutional: Negative for fever, diaphoresis, activity change, appetite change and fatigue.  Respiratory: Negative for shortness of breath.   Cardiovascular: Negative for chest pain.  Gastrointestinal: Negative for nausea.  Musculoskeletal: Positive for back pain.  Neurological: Negative for weakness.  Psychiatric/Behavioral: Negative for behavioral problems and confusion.    Vital Signs: BP 140/62 mmHg  Temp(Src) 97.9 F (36.6 C) (Oral)  Resp 16  Ht 5\' 5"  (1.651 m)  Wt 165 lb (74.844 kg)  BMI 27.46 kg/m2  SpO2 96%  Physical Exam  Constitutional: She is oriented to person, place, and time. She appears well-nourished.  Cardiovascular: Normal rate, regular rhythm and normal heart sounds.   No murmur heard. Pulmonary/Chest: Effort normal and breath sounds normal. She has no wheezes.  Abdominal: Soft. Bowel sounds are normal. There is no tenderness.    Musculoskeletal: Normal range of motion.  Neurological: She is alert and oriented to person, place, and time.  Skin: Skin is warm and dry.  Psychiatric: She has a normal mood and affect. Her behavior is normal. Judgment and thought content normal.  Nursing note and vitals reviewed.   Mallampati Score:  MD Evaluation Airway: WNL Heart: WNL Abdomen: WNL Chest/ Lungs: WNL ASA  Classification: 3 Mallampati/Airway  Score: One  Imaging: No results found.  Labs:  CBC:  Recent Labs  03/20/15 0525 03/21/15 0001 03/26/15 0958 01/11/16 0622  WBC 12.5* 12.4* 9.8 10.9*  HGB 10.9* 10.3* 12.1 12.7  HCT 33.2* 32.3* 35.7* 37.7  PLT 281 255 417.0* 330    COAGS:  Recent Labs  01/11/16 0622  INR 0.97  APTT 26    BMP:  Recent Labs  02/23/15 1059 03/19/15 1228 03/20/15 0525 09/26/15 0817  NA 137 136 139 140  K 3.8 3.6 3.1* 3.2*  CL 102 99* 107 101  CO2 27 25 21* 28  GLUCOSE 149* 217* 129* 152*  BUN 19 16 13 17   CALCIUM 9.6 9.5 8.5* 9.5  CREATININE 1.00 1.17* 0.94 1.05  GFRNONAA  --  45* 59*  --   GFRAA  --  53* >60  --     LIVER FUNCTION TESTS:  Recent Labs  03/19/15 1228 09/26/15 0817  BILITOT 0.5 0.4  AST 21 16  ALT 16 13  ALKPHOS 83 84  PROT 7.2 7.1  ALBUMIN 3.8 3.9    TUMOR MARKERS: No results for input(s): AFPTM, CEA, CA199, CHROMGRNA in the last 8760 hours.  Assessment and Plan:  Nephrotic range proteinuria Scheduled for random renal biopsy per Dr Posey Pronto request Risks and Benefits discussed with the patient including, but not limited to bleeding, infection, damage to adjacent structures or low yield requiring additional tests. All of the patient's questions were answered, patient is agreeable to proceed. Consent signed and in chart.   Thank you for this interesting consult.  I greatly enjoyed meeting Sheila Melton and look forward to participating in their care.  A copy of this report was sent to the requesting provider on this  date.  Electronically Signed: Monia Sabal A 01/11/2016, 7:25 AM   I spent a total of  30 Minutes   in face to face in clinical consultation, greater than 50% of which was counseling/coordinating care for random renal biopsy

## 2016-01-18 ENCOUNTER — Encounter: Payer: Self-pay | Admitting: Internal Medicine

## 2016-01-18 ENCOUNTER — Ambulatory Visit (INDEPENDENT_AMBULATORY_CARE_PROVIDER_SITE_OTHER): Payer: Medicare Other | Admitting: Internal Medicine

## 2016-01-18 VITALS — BP 140/68 | HR 87 | Temp 98.3°F | Resp 16 | Wt 167.0 lb

## 2016-01-18 DIAGNOSIS — E1165 Type 2 diabetes mellitus with hyperglycemia: Secondary | ICD-10-CM

## 2016-01-18 DIAGNOSIS — E785 Hyperlipidemia, unspecified: Secondary | ICD-10-CM

## 2016-01-18 DIAGNOSIS — Z0000 Encounter for general adult medical examination without abnormal findings: Secondary | ICD-10-CM

## 2016-01-18 DIAGNOSIS — G479 Sleep disorder, unspecified: Secondary | ICD-10-CM

## 2016-01-18 DIAGNOSIS — R809 Proteinuria, unspecified: Secondary | ICD-10-CM | POA: Diagnosis not present

## 2016-01-18 DIAGNOSIS — E1129 Type 2 diabetes mellitus with other diabetic kidney complication: Secondary | ICD-10-CM

## 2016-01-18 DIAGNOSIS — I10 Essential (primary) hypertension: Secondary | ICD-10-CM

## 2016-01-18 DIAGNOSIS — IMO0002 Reserved for concepts with insufficient information to code with codable children: Secondary | ICD-10-CM

## 2016-01-18 NOTE — Assessment & Plan Note (Signed)
Taking pravastatin Check lipid panel prior to next visit

## 2016-01-18 NOTE — Progress Notes (Signed)
Subjective:    Patient ID: Sheila Melton, female    DOB: 04/02/1941, 75 y.o.   MRN: [id number]  HPI She is here to establish with a new pcp.  She is here for routine follow-up.  Diabetes: she is following with endocrine.  She is taking her medication daily as prescribed. She is compliant with a diabetic diet. She is not exercising regularly due to back pain. She monitors her sugars and they have been running slightly higher for the past few days-170's in the morning, but lower the rest of the day. Recent A1c was 6.9%.   Chronic back pain: She has had back surgery in the past and can not exercise.  She is doing PT.  She has daily lower back pain.  She also has pain in her right shoulder blade, which she has had in the past. She takes meloxicam.   She will see her orthopedic today regarding her shoulder pain- she has had injections there in the past, which helped and is hoping to get one today.  She takes tramadol as needed for her back pain.  Insomnia:  She takes valvium to help her sleep nightly because she often wakes up and is unable to get back to sleep. She takes the Valium she is to sleep. She has been taking this for a long time denies any side effects..   Medications and allergies reviewed with patient and updated if appropriate.  Patient Active Problem List   Diagnosis Date Noted  . Type 2 diabetes mellitus with circulatory disorder, without long-term current use of insulin ([place]) 10/23/2015  . Numbness of toes-Right foot 06/26/2015  . Proteinuria 03/26/2015  . Colitis 03/19/2015  . AKI (acute kidney injury) ([place]) 03/19/2015  . Nephrolithiasis 03/19/2015  . AAA (abdominal aortic aneurysm) without rupture ([place]) 03/19/2015  . HLD (hyperlipidemia) 03/19/2015  . Anxiety state 03/19/2015  . Skin cancer 01/09/2015  . Former smoker 05/24/2014  . Diabetes mellitus with renal manifestations, uncontrolled ([place]) 09/16/2013  . CAP (community acquired pneumonia) 09/03/2013  . Bilateral  renal cysts 09/16/2012  . 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 Degenerative spondylolisthesis 07/26/2012  . Abdominal aneurysm without mention of rupture 05/25/2012  . 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 UNSPECIFIED PERIPHERAL VASCULAR DISEASE 10/22/2010  . CHRONIC OBSTRUCTIVE PULMONARY DISEASE, ACUTE EXACERBATION 09/04/2010  . DIARRHEA 07/24/2010  . SHOULDER PAIN, LEFT, CHRONIC 07/25/2009  . MUSCLE PAIN 06/11/2009  . CARDIAC MURMUR, SYSTOLIC [id number]  . Mixed hyperlipidemia 12/30/2007  . Tobacco use disorder 12/30/2007  . Essential hypertension 12/30/2007  . SUBCLAVIAN STEAL SYNDROME 12/30/2007  . Palpitations 12/30/2007  . CAROTID BRUIT 04/07/2007    Current Outpatient Prescriptions on File Prior to Visit  Medication Sig Dispense Refill  . acetaminophen (TYLENOL) 500 MG tablet Take 500 mg by mouth every 6 (six) hours as needed for pain.    Marland Kitchen amLODipine (NORVASC) 5 MG tablet TAKE 1 BY MOUTH TWICE DAILY 180 tablet 2  . B Complex-C (SUPER B COMPLEX PO) Take 1 tablet by mouth daily.     . Cholecalciferol (VITAMIN D3) 2000 UNITS TABS Take 2,000 Units by mouth daily.     . diazepam (VALIUM) 5 MG tablet TAKE 1 TABLET AT BEDTIME AS NEEDED. 30 tablet 0  . fexofenadine (ALLEGRA) 180 MG tablet Take 180 mg by mouth at bedtime.     . furosemide (LASIX) 40 MG tablet TAKE 1/2 TO 1 BY MOUTH DAILY AS NEEDED FOR EDEMA 90 tablet 1  . glucose  blood (ONE TOUCH ULTRA TEST) test strip Use to test blood sugar once daily as instructed. Dx: E11.59 100 each 3  . labetalol (NORMODYNE) 300 MG tablet TAKE 1 BY MOUTH TWICE DAILY (GENERIC FOR NORMODYNE) 180 tablet 2  . Liniments (SALONPAS) PADS Apply 1 each topically every 8 (eight) hours as needed (pain).    . Loperamide HCl (IMODIUM PO) Take 1 tablet by mouth as needed (for loose stools).    . meloxicam (MOBIC) 7.5 MG tablet TAKE 1 TABLET BY MOUTH EVERY DAY 30 tablet 3  . Menthol, Topical Analgesic, (BIOFREEZE EX)  Apply 1 application topically 2 (two) times daily as needed (back pain).    . metFORMIN (GLUCOPHAGE) 500 MG tablet TAKE 1 tablet by mouth 2x a day with meals (Patient taking differently: Take 500 mg by mouth 2 (two) times daily with a meal. TAKE 1 tablet by mouth 2x a day with meals) 180 tablet 1  . pravastatin (PRAVACHOL) 40 MG tablet TAKE 1 BY MOUTH DAILY 90 tablet 2  . traMADol (ULTRAM) 50 MG tablet Take 50 mg by mouth 2 (two) times daily as needed.   0  . valsartan (DIOVAN) 320 MG tablet TAKE 1 BY MOUTH AT BEDTIME 90 tablet 2  . vitamin E 400 UNIT capsule Take 400 Units by mouth daily.     No current facility-administered medications on file prior to visit.    Past Medical History  Diagnosis Date  . Hyperlipidemia   . Diabetes mellitus     2  . Hypertension     unspecified essential  . Family history of anesthesia complication     PATIENTS MOM HAD TROUBLE WAKING UP   . Heart murmur   . Subclavian steal syndrome   . Subclavian steal syndrome     LEFT SIDE   NO SIDE EFFECTS  . AAA (abdominal aortic aneurysm) (HCC)     BEING WATCHED   [facility]   . Arthritis   . CAP (community acquired pneumonia) 10/24-27/14     with SIRS  . UTI (lower urinary tract infection) 08/26/13    Enterococcus and Escherichia coli both sensitive to nitrofurantoin    Past Surgical History  Procedure Laterality Date  . Cataract extraction      OS  . G1 p1    . Abdominal hysterectomy    . Dilation and curettage of uterus    . Cyst excision Right 05-23-13    Right index finger: cyst  . Spine surgery  Sept. 2013  . Eye surgery      Social History   Social History  . Marital Status: Married    Spouse Name: N/A  . Number of Children: N/A  . Years of Education: N/A   Social History Main Topics  . Smoking status: Current Some Day Smoker -- 1.00 packs/day for 50 years    Types: E-cigarettes  . Smokeless tobacco: Never Used  . Alcohol Use: No  . Drug Use: No  . Sexual Activity: Not Asked   Other  Topics Concern  . None   Social History Narrative   Has 3 caffeinated bev a day    Family History  Problem Relation Age of Onset  . Coronary artery disease    . Diabetes    . Heart failure Mother   . Heart disease Mother   . Hypertension Mother   . Other Mother     varicose veins  . Deep vein thrombosis Mother   . Varicose Veins Mother   .  Heart attack Mother   . Hypertension Sister     X 2  . Diabetes Sister   . Hyperlipidemia Sister   . Other Sister     varicose veins  . Heart disease Father   . Diabetes Father   . Hyperlipidemia Father   . Hypertension Father   . Heart attack Father   . Heart disease Brother     MI in late 67s  . Hyperlipidemia Brother   . Heart attack Brother   . Parkinsonism Brother   . Heart failure Brother   . Hypertension Daughter     Review of Systems  Constitutional: Negative for fever and chills.  Respiratory: Negative for cough, shortness of breath and wheezing.   Cardiovascular: Positive for leg swelling. Negative for chest pain and palpitations.  Neurological: Negative for dizziness, light-headedness and headaches.       Objective:   Filed Vitals:   01/18/16 1124  BP: 140/68  Pulse: 87  Temp: 98.3 F (36.8 C)  Resp: 16   Filed Weights   01/18/16 1124  Weight: 167 lb (75.751 kg)   Body mass index is 27.79 kg/(m^2).   Physical Exam Constitutional: Appears well-developed and well-nourished. No distress.  Neck: Neck supple. No tracheal deviation present. No thyromegaly present.  No carotid bruit. No cervical adenopathy.   Cardiovascular: Normal rate, regular rhythm and normal heart sounds.   No murmur heard.  No edema Pulmonary/Chest: Effort normal and breath sounds normal. No respiratory distress. No wheezes.        Assessment & Plan:     See Problem List for Assessment and Plan of chronic medical problems.  She will follow-up in 4 months for a physical/wellness visit and blood work ordered for her to have  done prior

## 2016-01-18 NOTE — Assessment & Plan Note (Signed)
Has been taking Valium nightly for years without side effects. Difficulty sleeping without medication Will continue

## 2016-01-18 NOTE — Assessment & Plan Note (Signed)
Recently had a kidney biopsy-results pending

## 2016-01-18 NOTE — Assessment & Plan Note (Signed)
BP Readings from Last 3 Encounters:  01/18/16 140/68  01/11/16 140/65  12/31/15 122/60   Blood pressure slightly elevated here today Continue current medications

## 2016-01-18 NOTE — Assessment & Plan Note (Signed)
Following with endocrine Recent A1c 6.9% Sugars slightly elevated recently She anticipates possibly getting a steroid injection today-explained that this may increase her sugars Continue current dose of metformin-endocrine is currently managing

## 2016-01-18 NOTE — Patient Instructions (Addendum)
  It was nice to meet you.   No immunizations administered today.   Medications reviewed and updated.  No changes recommended at this time.    Please followup in 4 months

## 2016-01-18 NOTE — Progress Notes (Signed)
Patient received education resource, including the self-management goal and tool. Patient verbalized understanding. 

## 2016-01-21 ENCOUNTER — Telehealth: Payer: Self-pay | Admitting: Internal Medicine

## 2016-01-21 NOTE — Telephone Encounter (Signed)
Please read message and advise

## 2016-01-21 NOTE — Telephone Encounter (Signed)
Pt said that she is having bad shoulder and knee problems and that she went to be seen about this and they will more than likely give her a steroid shot and she said it makes her blood sugar go up and she wants to know how high that it can or should get without worry.

## 2016-01-22 MED ORDER — GLIPIZIDE 5 MG PO TABS: 5.0000 mg | ORAL_TABLET | Freq: Every day | ORAL | Status: DC

## 2016-01-22 NOTE — Addendum Note (Signed)
Addended by: Rockie Neighbours B on: 01/22/2016 02:40 PM   Modules accepted: Orders

## 2016-01-22 NOTE — Telephone Encounter (Signed)
Her sugars will increase after the steroid shot, but it is difficult to predict by how much. She may need a prescription for glipizide 5 mg to take before breakfast and may also need it before dinner if she starts steroids. Please call us with sugars if they do not improve after glipizide.

## 2016-01-22 NOTE — Telephone Encounter (Signed)
Called pt and lvm advising her per Dr Arman Filter message. Sent an rx for Glipizide 5 mg to take before breakfast. Advised pt to call if her sugars do not improve after glipizide.

## 2016-01-25 ENCOUNTER — Encounter (HOSPITAL_COMMUNITY): Payer: Self-pay

## 2016-01-28 ENCOUNTER — Other Ambulatory Visit: Payer: Self-pay | Admitting: Internal Medicine

## 2016-01-29 ENCOUNTER — Other Ambulatory Visit: Payer: Self-pay | Admitting: *Deleted

## 2016-01-29 MED ORDER — METFORMIN HCL 500 MG PO TABS: ORAL_TABLET | ORAL | Status: DC

## 2016-02-06 ENCOUNTER — Telehealth: Payer: Self-pay | Admitting: Internal Medicine

## 2016-02-06 ENCOUNTER — Other Ambulatory Visit: Payer: Self-pay | Admitting: Physical Medicine and Rehabilitation

## 2016-02-06 DIAGNOSIS — I6502 Occlusion and stenosis of left vertebral artery: Secondary | ICD-10-CM

## 2016-02-07 MED ORDER — DIAZEPAM 5 MG PO TABS: ORAL_TABLET | ORAL | Status: DC

## 2016-02-07 NOTE — Telephone Encounter (Signed)
printed

## 2016-02-07 NOTE — Addendum Note (Signed)
Addended by: Binnie Rail on: 02/07/2016 09:57 AM   Modules accepted: Orders

## 2016-02-07 NOTE — Telephone Encounter (Signed)
Faxed to pharm 

## 2016-02-09 ENCOUNTER — Other Ambulatory Visit: Payer: Medicare Other

## 2016-02-13 ENCOUNTER — Telehealth: Payer: Self-pay | Admitting: Cardiology

## 2016-02-13 NOTE — Telephone Encounter (Signed)
Received records from [place] for appointment on 02/26/16 with Dr Stanford Breed.  Records given to [facility] (medical records) for Dr Jacalyn Lefevre schedule on 02/26/16. lp

## 2016-02-25 ENCOUNTER — Encounter: Payer: Self-pay | Admitting: Cardiology

## 2016-02-25 NOTE — Progress Notes (Signed)
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 Patient has an abdominal aortic aneurysm followed by vascular surgery. Echocardiogram July 2009 showed normal left ventricular function and mild mitral regurgitation. Patient has recently been seen by orthopedics for pain in her left shoulder. It is no left scapular area. It occurs with moving her upper extremity. She also notices this at night. She has no tingling or numbness in her left upper extremity. No loss of strength or sensation. She denies dyspnea on exertion, orthopnea, PND, pedal edema, palpitations, syncope or chest pain.  Current Outpatient Prescriptions  Medication Sig Dispense Refill  . acetaminophen (TYLENOL) 500 MG tablet Take 500 mg by mouth every 6 (six) hours as needed for pain.    Marland Kitchen amLODipine (NORVASC) 5 MG tablet TAKE 1 BY MOUTH TWICE DAILY 180 tablet 2  . B Complex-C (SUPER B COMPLEX PO) Take 1 tablet by mouth daily.     . Cholecalciferol (VITAMIN D3) 2000 UNITS TABS Take 2,000 Units by mouth daily.     . diazepam (VALIUM) 5 MG tablet TAKE 1 TABLET AT BEDTIME AS NEEDED. 30 tablet 2  . fexofenadine (ALLEGRA) 180 MG tablet Take 180 mg by mouth at bedtime.     . furosemide (LASIX) 40 MG tablet TAKE 1/2 TO 1 BY MOUTH DAILY AS NEEDED FOR EDEMA 90 tablet 1  . glipiZIDE (GLUCOTROL) 5 MG tablet Take 1 tablet (5 mg total) by mouth daily before breakfast. 30 tablet 2  . glucose blood (ONE TOUCH ULTRA TEST) test strip Use to test blood sugar once daily as instructed. Dx: E11.59 100 each 3  . labetalol (NORMODYNE) 300 MG tablet TAKE 1 BY MOUTH TWICE DAILY (GENERIC FOR NORMODYNE) 180 tablet 2  . Liniments (SALONPAS) PADS Apply 1 each topically every 8 (eight) hours as needed (pain).    . Loperamide HCl (IMODIUM PO) Take 1 tablet by mouth as needed (for loose stools).    . meloxicam (MOBIC) 7.5 MG tablet TAKE 1 TABLET ONCE DAILY. 30 tablet 2  . Menthol, Topical Analgesic, (BIOFREEZE EX) Apply 1 application  topically 2 (two) times daily as needed (back pain).    . metFORMIN (GLUCOPHAGE) 500 MG tablet Take 2 tablet by mouth with evening meal as instructed. 180 tablet 0  . pravastatin (PRAVACHOL) 40 MG tablet TAKE 1 BY MOUTH DAILY 90 tablet 2  . traMADol (ULTRAM) 50 MG tablet Take 50 mg by mouth 2 (two) times daily as needed.   0  . valsartan (DIOVAN) 320 MG tablet TAKE 1 BY MOUTH AT BEDTIME 90 tablet 2  . vitamin E 400 UNIT capsule Take 400 Units by mouth daily.     No current facility-administered medications for this visit.    Allergies  Allergen Reactions  . Amoxicillin     REACTION: rash  . Rocephin [Ceftriaxone Sodium In Dextrose]     10/24 /14 blisters of palms & diffuse rash Because of a history of documented adverse serious drug reaction;Medi Alert bracelet  is recommended  . Captopril     REACTION: cough  . Simvastatin     REACTION: buttocks cramps  . Adhesive [Tape] Rash     Past Medical History  Diagnosis Date  . Hyperlipidemia   . Diabetes mellitus     2  . Hypertension     unspecified essential  . Family history of anesthesia complication     PATIENTS MOM HAD TROUBLE WAKING UP   . Heart murmur   . Subclavian steal syndrome  LEFT SIDE   NO SIDE EFFECTS  . AAA (abdominal aortic aneurysm) (HCC)     BEING WATCHED   [facility]   . Arthritis   . CAP (community acquired pneumonia) 10/24-27/14     with SIRS  . UTI (lower urinary tract infection) 08/26/13    Enterococcus and Escherichia coli both sensitive to nitrofurantoin  . Renal insufficiency     Past Surgical History  Procedure Laterality Date  . Cataract extraction      OS  . G1 p1    . Abdominal hysterectomy    . Dilation and curettage of uterus    . Cyst excision Right 05-23-13    Right index finger: cyst  . Spine surgery  Sept. 2013  . Eye surgery      Social History   Social History  . Marital Status: Married    Spouse Name: N/A  . Number of Children: 1  . Years of Education: N/A   Occupational  History  . Not on file.   Social History Main Topics  . Smoking status: Current Some Day Smoker -- 1.00 packs/day for 50 years    Types: E-cigarettes  . Smokeless tobacco: Never Used  . Alcohol Use: No  . Drug Use: No  . Sexual Activity: Not on file   Other Topics Concern  . Not on file   Social History Narrative   Has 3 caffeinated bev a day    Family History  Problem Relation Age of Onset  . Coronary artery disease    . Diabetes    . Heart failure Mother   . Heart disease Mother   . Hypertension Mother   . Other Mother     varicose veins  . Deep vein thrombosis Mother   . Varicose Veins Mother   . Heart attack Mother   . Hypertension Sister     X 2  . Diabetes Sister   . Hyperlipidemia Sister   . Other Sister     varicose veins  . Heart disease Father   . Diabetes Father   . Hyperlipidemia Father   . Hypertension Father   . Heart attack Father   . Heart disease Brother     MI in late 45s  . Hyperlipidemia Brother   . Heart attack Brother   . Parkinsonism Brother   . Heart failure Brother   . Hypertension Daughter     ROS: Knee arthralgias but no fevers or chills, productive cough, hemoptysis, dysphasia, odynophagia, melena, hematochezia, dysuria, hematuria, rash, seizure activity, orthopnea, PND, pedal edema, claudication. Remaining systems are negative.  Physical Exam:   Blood pressure 136/60, pulse 85, height 5\' 5"  (1.651 m), weight 164 lb (74.39 kg).  General:  Well developed/well nourished in NAD Skin warm/dry Patient not depressed No peripheral clubbing Back-normal HEENT-normal/normal eyelids Neck supple/normal carotid upstroke bilaterally; left carotid bruit; no JVD; no thyromegaly chest - CTA/ normal expansion CV - RRR/normal S1 and S2; no murmurs, rubs or gallops;  PMI nondisplaced Abdomen -NT/ND, no HSM, no mass, + bowel sounds, no bruit 2+ femoral pulses, no bruits Ext-no edema, chords, 1+ DP Neuro-grossly nonfocal  ECG Normal sinus  rhythm at a rate of 85. Nonspecific ST changes.

## 2016-02-26 ENCOUNTER — Ambulatory Visit (INDEPENDENT_AMBULATORY_CARE_PROVIDER_SITE_OTHER): Payer: Medicare Other | Admitting: Cardiology

## 2016-02-26 ENCOUNTER — Encounter: Payer: Self-pay | Admitting: Cardiology

## 2016-02-26 VITALS — BP 136/60 | HR 85 | Ht 65.0 in | Wt 164.0 lb

## 2016-02-26 DIAGNOSIS — I10 Essential (primary) hypertension: Secondary | ICD-10-CM

## 2016-02-26 DIAGNOSIS — F17200 Nicotine dependence, unspecified, uncomplicated: Secondary | ICD-10-CM

## 2016-02-26 DIAGNOSIS — E782 Mixed hyperlipidemia: Secondary | ICD-10-CM

## 2016-02-26 DIAGNOSIS — I714 Abdominal aortic aneurysm, without rupture, unspecified: Secondary | ICD-10-CM

## 2016-02-26 DIAGNOSIS — G458 Other transient cerebral ischemic attacks and related syndromes: Secondary | ICD-10-CM | POA: Diagnosis not present

## 2016-02-26 DIAGNOSIS — R0989 Other specified symptoms and signs involving the circulatory and respiratory systems: Secondary | ICD-10-CM

## 2016-02-26 MED ORDER — ASPIRIN EC 81 MG PO TBEC: 81.0000 mg | DELAYED_RELEASE_TABLET | Freq: Every day | ORAL | Status: DC

## 2016-02-26 NOTE — Assessment & Plan Note (Signed)
Continue statin. 

## 2016-02-26 NOTE — Assessment & Plan Note (Signed)
Patient complains of left scapular pain. Symptoms sound likely to be musculoskeletal. However she does have a history of subclavian steal. We will continue statin and add aspirin 81 mg daily. I will arrange carotid and upper extremity Dopplers to further evaluate for subclavian steal. If significant we will ask Dr Fletcher Anon or Dr Gwenlyn Found to evaluate.

## 2016-02-26 NOTE — Assessment & Plan Note (Signed)
Blood pressure controlled. Continue present medications. 

## 2016-02-26 NOTE — Assessment & Plan Note (Signed)
Followed by vascular surgery. 

## 2016-02-26 NOTE — Assessment & Plan Note (Signed)
Patient counseled on discontinuing. 

## 2016-02-26 NOTE — Patient Instructions (Signed)
Medication Instructions:   START ASPIRIN 81 MG ONCE DAILY  Testing/Procedures:  Your physician has requested that you have a carotid duplex. This test is an ultrasound of the carotid arteries in your neck. It looks at blood flow through these arteries that supply the brain with blood. Allow one hour for this exam. There are no restrictions or special instructions.   Your physician has requested that you have a upper extremity arterial duplex. During this test, ultrasound are used to evaluate arterial blood flow in the upper ext Allow one hour for this exam. There are no restrictions or special instructions.   Follow-Up:  Your physician recommends that you schedule a follow-up appointment in: [place]

## 2016-03-05 ENCOUNTER — Other Ambulatory Visit (HOSPITAL_COMMUNITY): Payer: Medicare Other

## 2016-03-05 ENCOUNTER — Encounter (HOSPITAL_COMMUNITY): Payer: Medicare Other

## 2016-03-07 ENCOUNTER — Ambulatory Visit (HOSPITAL_COMMUNITY)
Admission: RE | Admit: 2016-03-07 | Discharge: 2016-03-07 | Disposition: A | Discharge status: Home / Self Care | Payer: Medicare Other | Source: Ambulatory Visit | Attending: Cardiovascular Disease | Admitting: Cardiovascular Disease

## 2016-03-07 DIAGNOSIS — R0989 Other specified symptoms and signs involving the circulatory and respiratory systems: Secondary | ICD-10-CM

## 2016-03-07 DIAGNOSIS — E119 Type 2 diabetes mellitus without complications: Secondary | ICD-10-CM | POA: Insufficient documentation

## 2016-03-07 DIAGNOSIS — G458 Other transient cerebral ischemic attacks and related syndromes: Secondary | ICD-10-CM

## 2016-03-07 DIAGNOSIS — E785 Hyperlipidemia, unspecified: Secondary | ICD-10-CM | POA: Insufficient documentation

## 2016-03-07 DIAGNOSIS — I6523 Occlusion and stenosis of bilateral carotid arteries: Secondary | ICD-10-CM | POA: Insufficient documentation

## 2016-03-07 DIAGNOSIS — I10 Essential (primary) hypertension: Secondary | ICD-10-CM | POA: Insufficient documentation

## 2016-03-07 DIAGNOSIS — I708 Atherosclerosis of other arteries: Secondary | ICD-10-CM | POA: Insufficient documentation

## 2016-03-11 ENCOUNTER — Encounter: Payer: Self-pay | Admitting: Internal Medicine

## 2016-03-11 ENCOUNTER — Telehealth: Payer: Self-pay | Admitting: Cardiology

## 2016-03-11 NOTE — Progress Notes (Signed)
Called by Dr. 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 He will start her on prednisone, 40 mg daily for 3-6 months. Patient was advised that her sugars might start to increase. She was advised to call our office if they do.  We can add glipizide to help with the high blood sugars in case her sugars to start to increase. I hope that we do not need to add mealtime insulin, but this is a possibility.

## 2016-03-11 NOTE — Telephone Encounter (Signed)
Returning call about her test results , Per Patient it is ok to leave a message on her home phone   Thanks

## 2016-03-11 NOTE — Telephone Encounter (Signed)
Left detailed message of dopplers results for pt. Made her aware the subclavian steal is now severe and I need her to call back to make an appt.

## 2016-03-12 NOTE — Telephone Encounter (Signed)
Spoke with pt, Follow up scheduled with dr Fletcher Anon.

## 2016-03-12 NOTE — Telephone Encounter (Signed)
Returning your call. °

## 2016-03-13 ENCOUNTER — Telehealth: Payer: Self-pay | Admitting: Internal Medicine

## 2016-03-13 NOTE — Telephone Encounter (Signed)
Pt was prescribed Prednisone for 6 months from Dr. Posey Pronto for some Kidney problems.  This morning, she ate bowl of grits, took her Glipizide and Metformin and checked her sugars about 2 hours after and it was 177.  About an hour and a half ago ate crackers and oatmeal, and just now checked her blood sugar and it was 343.  She would like to know what she needs to do because she has to take the prednisone for this issue but does not want her sugars to continue to be so high.  Please advise. CB# [phone]

## 2016-03-13 NOTE — Telephone Encounter (Signed)
Please increase the Glipizide to 10 mg in am and 5 mg with dinner (I believe she is now on only 5 mg Glipizide in am). She can take a 5 mg Glipizide now.

## 2016-03-13 NOTE — Telephone Encounter (Signed)
Please advise thanks.

## 2016-03-13 NOTE — Telephone Encounter (Signed)
Left a vm advising of note below. Requested a call back if the pt would like to discuss.  

## 2016-03-25 ENCOUNTER — Ambulatory Visit (INDEPENDENT_AMBULATORY_CARE_PROVIDER_SITE_OTHER): Payer: Medicare Other | Admitting: Cardiovascular Disease

## 2016-03-25 ENCOUNTER — Encounter: Payer: Self-pay | Admitting: Cardiovascular Disease

## 2016-03-25 VITALS — BP 142/76 | HR 80 | Ht 65.0 in | Wt 161.8 lb

## 2016-03-25 DIAGNOSIS — G458 Other transient cerebral ischemic attacks and related syndromes: Secondary | ICD-10-CM | POA: Diagnosis not present

## 2016-03-25 NOTE — Progress Notes (Signed)
Cardiology Office Note   Date:  03/25/2016   ID:  AMYE KUPFERMAN, DOB 1941-08-05, MRN [id number]  PCP:  Binnie Rail, MD  Cardiologist: Dr. Stanford Breed  Chief Complaint  Patient presents with  . New Evaluation    hx subclavian steal      History of Present Illness: Sheila Melton is a 75 y.o. female who was referred by Dr. 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 The patient has known history of abdominal aortic aneurysm followed by vascular surgery, hyperlipidemia, kidney disease currently on steroids, tobacco use and diabetes mellitus.  Echocardiogram July 2009 showed normal left ventricular function and mild mitral regurgitation.  She was told about left subclavian artery stenosis about 7 or 8 years ago which was treated medically due to lack of symptoms. Patient has recently been seen by orthopedics for pain in her left shoulder. It is in left scapular area. It occurs with moving her upper extremity. She was treated with steroid injection with subsequent improvement. She had an MRI done which showed no flow in the left vertebral artery and thus she was referred for evaluation. She underwent carotid Doppler which showed mild nonobstructive bilateral disease with left subclavian artery stenosis and retrograde flow in the left vertebral artery suggestive of steal by Doppler. Clinically, she denies any left arm pain. She denies any significant dizziness, vertigo, nausea or vomiting.  Past Medical History  Diagnosis Date  . Hyperlipidemia   . Diabetes mellitus     2  . Hypertension     unspecified essential  . Family history of anesthesia complication     PATIENTS MOM HAD TROUBLE WAKING UP   . Heart murmur   . Subclavian steal syndrome     LEFT SIDE   NO SIDE EFFECTS  . AAA (abdominal aortic aneurysm) (HCC)     BEING WATCHED   [facility]   . Arthritis   . CAP (community acquired pneumonia) 10/24-27/14     with SIRS  . UTI (lower urinary tract infection) 08/26/13   Enterococcus and Escherichia coli both sensitive to nitrofurantoin  . Renal insufficiency     Past Surgical History  Procedure Laterality Date  . Cataract extraction      OS  . G1 p1    . Abdominal hysterectomy    . Dilation and curettage of uterus    . Cyst excision Right 05-23-13    Right index finger: cyst  . Spine surgery  Sept. 2013  . Eye surgery       Current Outpatient Prescriptions  Medication Sig Dispense Refill  . amLODipine (NORVASC) 5 MG tablet TAKE 1 BY MOUTH TWICE DAILY 180 tablet 2  . aspirin EC 81 MG tablet Take 1 tablet (81 mg total) by mouth daily. 90 tablet 3  . B Complex-C (SUPER B COMPLEX PO) Take 1 tablet by mouth daily.     . Cholecalciferol (VITAMIN D3) 2000 UNITS TABS Take 2,000 Units by mouth daily.     . diazepam (VALIUM) 5 MG tablet TAKE 1 TABLET AT BEDTIME AS NEEDED. 30 tablet 2  . fexofenadine (ALLEGRA) 180 MG tablet Take 180 mg by mouth at bedtime.     . furosemide (LASIX) 40 MG tablet TAKE 1/2 TO 1 BY MOUTH DAILY AS NEEDED FOR EDEMA 90 tablet 1  . glipiZIDE (GLUCOTROL) 5 MG tablet Take 2 tablets by mouth daily before supper.  1  . glucose blood (ONE TOUCH ULTRA TEST) test strip Use to test blood sugar  once daily as instructed. Dx: E11.59 100 each 3  . labetalol (NORMODYNE) 300 MG tablet TAKE 1 BY MOUTH TWICE DAILY (GENERIC FOR NORMODYNE) 180 tablet 2  . meloxicam (MOBIC) 7.5 MG tablet TAKE 1 TABLET ONCE DAILY. 30 tablet 2  . metFORMIN (GLUCOPHAGE) 500 MG tablet Take 1 tablet by mouth 2 (two) times daily.  0  . pravastatin (PRAVACHOL) 40 MG tablet TAKE 1 BY MOUTH DAILY 90 tablet 2  . predniSONE (DELTASONE) 20 MG tablet Take 1 tablet by mouth 2 (two) times daily.  5  . valsartan (DIOVAN) 320 MG tablet TAKE 1 BY MOUTH AT BEDTIME 90 tablet 2  . vitamin E 400 UNIT capsule Take 400 Units by mouth daily.     No current facility-administered medications for this visit.    Allergies:   Amoxicillin; Rocephin; Captopril; Simvastatin; and Adhesive     Social History:  The patient  reports that she has been smoking E-cigarettes.  She has a 50 pack-year smoking history. She has never used smokeless tobacco. She reports that she does not drink alcohol or use illicit drugs.   Family History:  The patient's family history includes Deep vein thrombosis in her mother; Diabetes in her father and sister; Heart attack in her brother, father, and mother; Heart disease in her brother, father, and mother; Heart failure in her brother and mother; Hyperlipidemia in her brother, father, and sister; Hypertension in her daughter, father, mother, and sister; Other in her mother and sister; Parkinsonism in her brother; Varicose Veins in her mother.    ROS:  Please see the history of present illness.   Otherwise, review of systems are positive for none.   All other systems are reviewed and negative.    PHYSICAL EXAM: VS:  BP 142/76 mmHg  Pulse 80  Ht 5\' 5"  (1.651 m)  Wt 161 lb 12.8 oz (73.392 kg)  BMI 26.92 kg/m2 , BMI Body mass index is 26.92 kg/(m^2). GEN: Well nourished, well developed, in no acute distress HEENT: normal Neck: no JVD, carotid bruits, or masses. She does have a bruit at the left upper chest area Cardiac: RRR; no murmurs, rubs, or gallops,no edema  Respiratory:  clear to auscultation bilaterally, normal work of breathing GI: soft, nontender, nondistended, + BS MS: no deformity or atrophy Skin: warm and dry, no rash Neuro:  Strength and sensation are intact Psych: euthymic mood, full affect Vascular: Radial pulses normal on the right side and mildly diminished on the left side.  EKG:  EKG is not ordered today.   Recent Labs: 09/26/2015: ALT 13; BUN 17; Creatinine, Ser 1.05; Potassium 3.2*; Sodium 140; TSH 4.20 01/11/2016: Hemoglobin 12.7; Platelets 330    Lipid Panel    Component Value Date/Time   CHOL 245* 09/26/2015 0817   TRIG 322.0* 09/26/2015 0817   TRIG 263* 09/15/2006 1116   HDL 40.90 09/26/2015 0817   CHOLHDL 6  09/26/2015 0817   CHOLHDL 4.9 CALC 09/15/2006 1116   VLDL 64.4* 09/26/2015 0817   LDLCALC 123* 05/24/2014 0746   LDLDIRECT 118.0 09/26/2015 0817   LDLDIRECT 95.3 09/15/2006 1116      Wt Readings from Last 3 Encounters:  03/25/16 161 lb 12.8 oz (73.392 kg)  02/26/16 164 lb (74.39 kg)  01/18/16 167 lb (75.751 kg)        ASSESSMENT AND PLAN:  1.  Left subclavian artery stenosis: The patient has no symptoms related to this. The left shoulder pain does not seem to be related at all. She has  no left arm claudication. Although Doppler showed vertebral steal, she has no clinical symptoms of this. Thus, there is no indication for revascularization. She has been known to have left subclavian artery stenosis for many years. Continue low-dose aspirin.  2. Essential hypertension: Blood pressure is reasonably controlled.  3. Hyperlipidemia: Continue treatment with a statin with a target LDL of less than 100.  4. Tobacco use: I advised her to quit smoking completely.   Disposition:   FU with me in 1 year  Signed,  Kathlyn Sacramento, MD  03/25/2016 3:25 PM    [place]

## 2016-03-25 NOTE — Patient Instructions (Signed)

## 2016-03-26 ENCOUNTER — Other Ambulatory Visit: Payer: Self-pay | Admitting: Internal Medicine

## 2016-03-27 ENCOUNTER — Telehealth: Payer: Self-pay | Admitting: Internal Medicine

## 2016-03-27 ENCOUNTER — Other Ambulatory Visit: Payer: Self-pay | Admitting: *Deleted

## 2016-03-27 MED ORDER — GLIPIZIDE 5 MG PO TABS: ORAL_TABLET | ORAL | Status: DC

## 2016-03-27 NOTE — Telephone Encounter (Signed)
Called pt and advised her that we sent the rx refill in this morning to [facility].

## 2016-03-27 NOTE — Telephone Encounter (Signed)
Patient need a new prescription for glipiZIDE (GLUCOTROL) 5 MG tablet, send to  (Patient is totally out) [facility], [place]. [phone] (Phone) [phone] (Fax)

## 2016-04-03 ENCOUNTER — Encounter: Payer: Self-pay | Admitting: Internal Medicine

## 2016-04-03 ENCOUNTER — Ambulatory Visit: Payer: Medicare Other | Admitting: Cardiology

## 2016-04-25 LAB — HM DIABETES EYE EXAM

## 2016-04-26 ENCOUNTER — Encounter: Payer: Self-pay | Admitting: Internal Medicine

## 2016-04-28 ENCOUNTER — Telehealth: Payer: Self-pay | Admitting: Emergency Medicine

## 2016-04-28 ENCOUNTER — Encounter: Payer: Self-pay | Admitting: Internal Medicine

## 2016-04-28 MED ORDER — MELOXICAM 7.5 MG PO TABS: 7.5000 mg | ORAL_TABLET | Freq: Every day | ORAL | Status: DC

## 2016-04-28 MED ORDER — DIAZEPAM 5 MG PO TABS: ORAL_TABLET | ORAL | Status: DC

## 2016-04-28 NOTE — Telephone Encounter (Signed)
Valium printed - please fax to [facility]

## 2016-04-28 NOTE — Telephone Encounter (Signed)
Rx for Valium faxed to [facility]

## 2016-04-29 ENCOUNTER — Encounter: Payer: Self-pay | Admitting: Internal Medicine

## 2016-04-29 ENCOUNTER — Ambulatory Visit (INDEPENDENT_AMBULATORY_CARE_PROVIDER_SITE_OTHER): Payer: Medicare Other | Admitting: Internal Medicine

## 2016-04-29 DIAGNOSIS — E1159 Type 2 diabetes mellitus with other circulatory complications: Secondary | ICD-10-CM | POA: Diagnosis not present

## 2016-04-29 LAB — POCT GLYCOSYLATED HEMOGLOBIN (HGB A1C): Hemoglobin A1C: 7.2

## 2016-04-29 MED ORDER — METFORMIN HCL 500 MG PO TABS: 500.0000 mg | ORAL_TABLET | Freq: Two times a day (BID) | ORAL | Status: DC

## 2016-04-29 MED ORDER — GLIPIZIDE 5 MG PO TABS: ORAL_TABLET | ORAL | Status: DC

## 2016-04-29 NOTE — Patient Instructions (Signed)
Please continue: - Metformin 500 mg 2x a day  Please decrease: - Glipizide to 7.5 mg 2x a day  Please return in 1.5 months with your sugar log.

## 2016-04-29 NOTE — Progress Notes (Signed)
Patient ID: Sheila Melton, female   DOB: [birth year], 75 y.o.   MRN: [id number]  HPI: Sheila Melton is a 75 y.o.-year-old female, returning for follow-up for  DM2, dx in ~2006, non-insulin-dependent, controlled, with complications (stage 3 CKD, circulatory complications - AAA). Last visit 4 months ago.  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 We started Glipizide to help with hyperglycemia.  During the first month on the Prednisone, she became manic, had exophthalmos >> could not drive, felt very poorly. Her dose was then decreased it gradually to 5 mg - in last 1 week.  Last hemoglobin A1c was: Lab Results  Component Value Date   HGBA1C 6.9 12/31/2015   HGBA1C 6.9* 09/26/2015   HGBA1C 6.5 02/23/2015   Pt is on a regimen of: - Metformin 500 mg daily 2x a day - Glipizide 10 mg in am and 5 mg in pm  We stopped Actos 15 mg daily in 10/2015 as she felt that she gained 20 pounds after she started Actos. She was previously on metformin, but this was stopped due to her CKD. She was cleared by her nephrologist, Dr. Posey Pronto to restart metformin.   Pt checks her sugars 1x a day and they are: - am: 135-140 >> 133-154 >> 300 after starting Prednisone >> 150s - 2h after b'fast: n/c >> 134-167 >> 80-120 - before lunch: n/c >> 137 >> 120s - 2h after lunch: n/c >> 124-164 >> n/c - before dinner: n/c >> 118-135, 174 >> 150-180 - 2h after dinner: n/c >> 151-185 >> 220s - bedtime: n/c >> 128-164 >> n/c - nighttime: n/c No lows. Lowest sugar was 135 >> 80s; ? hypoglycemia awareness.  Highest sugar was 140 >> 300s.  Glucometer: One Touch   Pt's meals are: - Breakfast: cereals, bacon + eggs - Lunch: cheese crackers - Dinner: meat + veggies - Snacks: 1 or 2  - + CKD stage 3, last BUN/creatinine:  Lab Results  Component Value Date   BUN 17 09/26/2015   CREATININE 1.05 09/26/2015  GFR 54. On Diovan. - last set of lipids: Lab Results  Component Value Date    CHOL 245* 09/26/2015   HDL 40.90 09/26/2015   LDLCALC 123* 05/24/2014   LDLDIRECT 118.0 09/26/2015   TRIG 322.0* 09/26/2015   CHOLHDL 6 09/26/2015  On Pravastatin. - last eye exam was 04/2016 No DR. Dr Idolina Primer.   - no numbness and tingling in her feet.  She also has HTN, HL.  ROS: Constitutional: no weight loss/gain, +  fatigue, no subjective hypothermia Eyes: + blurry vision, no xerophthalmia ENT: no sore throat, no nodules palpated in throat, no dysphagia/odynophagia, no hoarseness Cardiovascular: no CP/SOB/palpitations/+ leg swelling Respiratory: no cough/SOB Gastrointestinal: no N/V/D/+ C Musculoskeletal: no muscle/+ joint aches, + back pain Skin: no rashes Neurological: no tremors/numbness/tingling/dizziness  I reviewed pt's medications, allergies, PMH, social hx, family hx, and changes were documented in the history of present illness. Otherwise, unchanged from my initial visit note.  Past Medical History  Diagnosis Date  . Hyperlipidemia   . Diabetes mellitus     2  . Hypertension     unspecified essential  . Family history of anesthesia complication     PATIENTS MOM HAD TROUBLE WAKING UP   . Heart murmur   . Subclavian steal syndrome     LEFT SIDE   NO SIDE EFFECTS  . AAA (abdominal aortic aneurysm) ([place])     BEING WATCHED  [facility]   . Arthritis   . CAP (community acquired pneumonia) 10/24-27/14     with SIRS  . UTI (lower urinary tract infection) 08/26/13    Enterococcus and Escherichia coli both sensitive to nitrofurantoin  . Renal insufficiency    Past Surgical History  Procedure Laterality Date  . Cataract extraction      OS  . G1 p1    . Abdominal hysterectomy    . Dilation and curettage of uterus    . Cyst excision Right 05-23-13    Right index finger: cyst  . Spine surgery  Sept. 2013  . Eye surgery     Social History   Social History  . Marital Status: Married    Spouse Name: N/A  . Number of Children: 1   Occupational History  . retied    Social History Main Topics  . Smoking status: Current Some Day Smoker -- 1.00 packs/day for 50 years    Types: E-cigarettes  . Smokeless tobacco: Never Used  . Alcohol Use: No  . Drug Use: No  . Sexual Activity: Not on file   Other Topics Concern  . Not on file   Social History Narrative   Has 3 caffeinated bev a day   Current Outpatient Prescriptions on File Prior to Visit  Medication Sig Dispense Refill  . amLODipine (NORVASC) 5 MG tablet TAKE 1 BY MOUTH TWICE DAILY 180 tablet 2  . aspirin EC 81 MG tablet Take 1 tablet (81 mg total) by mouth daily. 90 tablet 3  . B Complex-C (SUPER B COMPLEX PO) Take 1 tablet by mouth daily.     . Cholecalciferol (VITAMIN D3) 2000 UNITS TABS Take 2,000 Units by mouth daily.     . diazepam (VALIUM) 5 MG tablet TAKE 1 TABLET AT BEDTIME AS NEEDED. 30 tablet 2  . fexofenadine (ALLEGRA) 180 MG tablet Take 180 mg by mouth at bedtime.     . furosemide (LASIX) 40 MG tablet TAKE 1/2 TO 1 BY MOUTH DAILY AS NEEDED FOR EDEMA 90 tablet 1  . glipiZIDE (GLUCOTROL) 5 MG tablet TAKE 2 TABLETS IN THE AM AND 1 TABLET AT DINNER BY MOUTH DAILY. 90 tablet 2  . glucose blood (ONE TOUCH ULTRA TEST) test strip Use to test blood sugar once daily as instructed. Dx: E11.59 100 each 3  . labetalol (NORMODYNE) 300 MG tablet TAKE 1 BY MOUTH TWICE DAILY (GENERIC FOR NORMODYNE) 180 tablet 2  . meloxicam (MOBIC) 7.5 MG tablet Take 1 tablet (7.5 mg total) by mouth daily. 30 tablet 0  . metFORMIN (GLUCOPHAGE) 500 MG tablet Take 1 tablet by mouth 2 (two) times daily.  0  . pravastatin (PRAVACHOL) 40 MG tablet TAKE 1 BY MOUTH DAILY 90 tablet 2  . predniSONE (DELTASONE) 20 MG tablet Take 1 tablet by mouth 2 (two) times daily.  5  . valsartan (DIOVAN) 320 MG tablet TAKE 1 BY MOUTH AT BEDTIME 90 tablet 2  . vitamin E 400 UNIT capsule Take 400 Units by mouth daily.     No current facility-administered medications on file prior to visit.   Allergies  Allergen Reactions  .  Amoxicillin     REACTION: rash  . Rocephin [Ceftriaxone Sodium In Dextrose]     10/24 /14 blisters of palms & diffuse rash Because of a history of documented adverse serious drug reaction;Medi Alert bracelet  is recommended  . Captopril     REACTION: cough  . Simvastatin     REACTION: buttocks cramps  .  Adhesive [Tape] Rash   Family History  Problem Relation Age of Onset  . Coronary artery disease    . Diabetes    . Heart failure Mother   . Heart disease Mother   . Hypertension Mother   . Other Mother     varicose veins  . Deep vein thrombosis Mother   . Varicose Veins Mother   . Heart attack Mother   . Hypertension Sister     X 2  . Diabetes Sister   . Hyperlipidemia Sister   . Other Sister     varicose veins  . Heart disease Father   . Diabetes Father   . Hyperlipidemia Father   . Hypertension Father   . Heart attack Father   . Heart disease Brother     MI in late 15s  . Hyperlipidemia Brother   . Heart attack Brother   . Parkinsonism Brother   . Heart failure Brother   . Hypertension Daughter    PE: BP 140/78 mmHg  Pulse 91  Ht 5\' 4"  (1.626 m)  Wt 160 lb 9.6 oz (72.848 kg)  BMI 27.55 kg/m2  SpO2 98% Body mass index is 27.55 kg/(m^2). Wt Readings from Last 3 Encounters:  04/29/16 160 lb 9.6 oz (72.848 kg)  03/25/16 161 lb 12.8 oz (73.392 kg)  02/26/16 164 lb (74.39 kg)   Constitutional: slightly overweight, in NAD Eyes: PERRLA, EOMI, no exophthalmos ENT: moist mucous membranes, no thyromegaly, no cervical lymphadenopathy Cardiovascular: RRR, No MRG, + mild periankle swelling Respiratory: CTA B Gastrointestinal: abdomen soft, NT, ND, BS+ Musculoskeletal: no deformities, strength intact in all 4 Skin: moist, warm, no rashes Neurological: no tremor with outstretched hands, DTR normal in all 4  ASSESSMENT: 1. DM2, non-insulin-dependent, controlled, with complications - CKD stage 3  PLAN:  1. Patient with long-standing, uncontrolled diabetes, on oral  antidiabetic regimen, prev. On Metformin 500 mg 2x a day, but we had to add Glipizide after she started Prednisone for her FSGS. She could not tolerate the high dose of Prednisone, now on 5 mg, with sugars improving. CBGs are lower after b'fast and higher after dinner >> will increase the Glipizide before dinner and decrease the Glipizide before b'fast  - I suggested to:  Patient Instructions  Please continue: - Metformin 500 mg 2x a day  Please decrease: - Glipizide to 7.5 mg 2x a day  Please return in 1.5 months with your sugar log.   - continue checking sugars at different times of the day - check 2x a day, rotating checks - advised for yearly eye exams >> she is UTD with dilated eye exams - checked HbA1c today >> 7.2% (slightly higher) - Return to clinic in 1.5 mo with sugar log

## 2016-05-21 ENCOUNTER — Encounter: Payer: Self-pay | Admitting: Internal Medicine

## 2016-05-21 ENCOUNTER — Ambulatory Visit (INDEPENDENT_AMBULATORY_CARE_PROVIDER_SITE_OTHER): Payer: Medicare Other | Admitting: Internal Medicine

## 2016-05-21 VITALS — BP 136/80 | HR 86 | Temp 98.2°F | Resp 16 | Wt 163.0 lb

## 2016-05-21 DIAGNOSIS — I10 Essential (primary) hypertension: Secondary | ICD-10-CM | POA: Diagnosis not present

## 2016-05-21 DIAGNOSIS — E785 Hyperlipidemia, unspecified: Secondary | ICD-10-CM

## 2016-05-21 DIAGNOSIS — N051 Unspecified nephritic syndrome with focal and segmental glomerular lesions: Secondary | ICD-10-CM

## 2016-05-21 DIAGNOSIS — N189 Chronic kidney disease, unspecified: Secondary | ICD-10-CM | POA: Insufficient documentation

## 2016-05-21 DIAGNOSIS — F411 Generalized anxiety disorder: Secondary | ICD-10-CM

## 2016-05-21 DIAGNOSIS — E1159 Type 2 diabetes mellitus with other circulatory complications: Secondary | ICD-10-CM

## 2016-05-21 DIAGNOSIS — G458 Other transient cerebral ischemic attacks and related syndromes: Secondary | ICD-10-CM

## 2016-05-21 DIAGNOSIS — Z87891 Personal history of nicotine dependence: Secondary | ICD-10-CM

## 2016-05-21 DIAGNOSIS — M4806 Spinal stenosis, lumbar region: Secondary | ICD-10-CM

## 2016-05-21 DIAGNOSIS — N032 Chronic nephritic syndrome with diffuse membranous glomerulonephritis: Secondary | ICD-10-CM

## 2016-05-21 DIAGNOSIS — N185 Chronic kidney disease, stage 5: Secondary | ICD-10-CM | POA: Insufficient documentation

## 2016-05-21 DIAGNOSIS — M4807 Spinal stenosis, lumbosacral region: Secondary | ICD-10-CM

## 2016-05-21 DIAGNOSIS — G9519 Other vascular myelopathies: Secondary | ICD-10-CM

## 2016-05-21 MED ORDER — TRAMADOL HCL 50 MG PO TABS
50.0000 mg | ORAL_TABLET | Freq: Every day | ORAL | Status: DC | PRN
Start: 2016-05-21 — End: 2016-07-04

## 2016-05-21 MED ORDER — MELOXICAM 7.5 MG PO TABS: 7.5000 mg | ORAL_TABLET | Freq: Every day | ORAL | Status: DC

## 2016-05-21 NOTE — Assessment & Plan Note (Signed)
BP well controlled Current regimen effective and well tolerated Continue current medications at current doses  

## 2016-05-21 NOTE — Patient Instructions (Addendum)
   All other Health Maintenance issues reviewed.   All recommended immunizations and age-appropriate screenings are up-to-date or discussed.  No immunizations administered today.   Medications reviewed and updated.  /  No changes recommended at this time.  Your prescription(s) have been submitted to your pharmacy. Please take as directed and contact our office if you believe you are having problem(s) with the medication(s).   Please followup in 6 months   

## 2016-05-21 NOTE — Assessment & Plan Note (Signed)
Follows with ortho Takes mobic daily Takes tramadol once daily if needed

## 2016-05-21 NOTE — Assessment & Plan Note (Signed)
On prednisone 5 mg daily - did not tolerate a higher dose Management per nephrology

## 2016-05-21 NOTE — Assessment & Plan Note (Signed)
Taking pravastatin 40 mg daily Recheck lipid panel in 6 months

## 2016-05-21 NOTE — Assessment & Plan Note (Signed)
Last a1c 7.2 Steroid dose lower and sugars are better controlled at home Continue on current medications - managed by Dr Cruzita Lederer

## 2016-05-21 NOTE — Progress Notes (Signed)
Pre visit review using our clinic review tool, if applicable. No additional management support is needed unless otherwise documented below in the visit note. 

## 2016-05-21 NOTE — Assessment & Plan Note (Signed)
Following with nephrology

## 2016-05-21 NOTE — Progress Notes (Signed)
Subjective:    Patient ID: Sheila Melton, female    DOB: 1941-07-26, 75 y.o.   MRN: [id number]  HPI She is here for follow up.  Diabetes: She follows with Dr Cruzita Lederer.  She is taking her medication daily as prescribed. Her medications were adjusted last month due to her being on steroids.  She is compliant with a diabetic diet. She is not exercising regularly due to back pain. She monitors her sugars and they have been running 80-90's.   CKD, FSGS:  She is on prednisone daily.  She did not tolerate the higher dose and is currently taking 5 mg daily. Nephrology is unsure if this is a high enough dose to treat the FSGS, but she did not tolerate the higher dose.  She has follow up in 2 months.  She does drink a lot of water throughout the day.   Hypertension: She is taking her medication daily. She is compliant with a low sodium diet.  She denies chest pain, palpitations, shortness of breath and regular headaches. She is not exercising regularly due to back pain.  She does not monitor her blood pressure at home.      Medications and allergies reviewed with patient and updated if appropriate.  Patient Active Problem List   Diagnosis Date Noted  . Sleep difficulties 01/18/2016  . Type 2 diabetes mellitus with circulatory disorder, without long-term current use of insulin ([place]) 10/23/2015  . Numbness of toes-Right foot 06/26/2015  . Proteinuria 03/26/2015  . Colitis 03/19/2015  . AKI (acute kidney injury) ([place]) 03/19/2015  . Nephrolithiasis 03/19/2015  . AAA (abdominal aortic aneurysm) without rupture ([place]) 03/19/2015  . HLD (hyperlipidemia) 03/19/2015  . Anxiety state 03/19/2015  . Skin cancer 01/09/2015  . Former smoker 05/24/2014  . CAP (community acquired pneumonia) 09/03/2013  . Bilateral renal cysts 09/16/2012  . 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 Degenerative spondylolisthesis 07/26/2012  . 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 CHRONIC  OBSTRUCTIVE PULMONARY DISEASE, ACUTE EXACERBATION 09/04/2010  . DIARRHEA 07/24/2010  . SHOULDER PAIN, LEFT, CHRONIC 07/25/2009  . MUSCLE PAIN 06/11/2009  . CARDIAC MURMUR, SYSTOLIC [id number]  . Mixed hyperlipidemia 12/30/2007  . Tobacco use disorder 12/30/2007  . Essential hypertension 12/30/2007  . SUBCLAVIAN STEAL SYNDROME 12/30/2007  . Palpitations 12/30/2007  . CAROTID BRUIT 04/07/2007    Current Outpatient Prescriptions on File Prior to Visit  Medication Sig Dispense Refill  . amLODipine (NORVASC) 5 MG tablet TAKE 1 BY MOUTH TWICE DAILY 180 tablet 2  . B Complex-C (SUPER B COMPLEX PO) Take 1 tablet by mouth daily.     . Cholecalciferol (VITAMIN D3) 2000 UNITS TABS Take 2,000 Units by mouth daily.     . diazepam (VALIUM) 5 MG tablet TAKE 1 TABLET AT BEDTIME AS NEEDED. 30 tablet 2  . fexofenadine (ALLEGRA) 180 MG tablet Take 180 mg by mouth at bedtime.     . furosemide (LASIX) 40 MG tablet TAKE 1/2 TO 1 BY MOUTH DAILY AS NEEDED FOR EDEMA 90 tablet 1  . glipiZIDE (GLUCOTROL) 5 MG tablet TAKE 1.5 TABLETS IN THE AM AND 1.5 TABLET AT DINNER BY MOUTH DAILY. 90 tablet 2  . glucose blood (ONE TOUCH ULTRA TEST) test strip Use to test blood sugar once daily as instructed. Dx: E11.59 100 each 3  . labetalol (NORMODYNE) 300 MG tablet TAKE 1 BY MOUTH TWICE DAILY (GENERIC FOR NORMODYNE) 180 tablet 2  . meloxicam (MOBIC) 7.5 MG tablet Take 1 tablet (7.5 mg  total) by mouth daily. 30 tablet 0  . metFORMIN (GLUCOPHAGE) 500 MG tablet Take 1 tablet (500 mg total) by mouth 2 (two) times daily. 180 tablet 3  . pravastatin (PRAVACHOL) 40 MG tablet TAKE 1 BY MOUTH DAILY 90 tablet 2  . predniSONE (DELTASONE) 20 MG tablet Take 1 tablet by mouth once. 5 MG once a day.  5  . valsartan (DIOVAN) 320 MG tablet TAKE 1 BY MOUTH AT BEDTIME 90 tablet 2  . vitamin E 400 UNIT capsule Take 400 Units by mouth daily.     No current facility-administered medications on file prior to visit.    Past Medical History    Diagnosis Date  . Hyperlipidemia   . Diabetes mellitus     2  . Hypertension     unspecified essential  . Family history of anesthesia complication     PATIENTS MOM HAD TROUBLE WAKING UP   . Heart murmur   . Subclavian steal syndrome     LEFT SIDE   NO SIDE EFFECTS  . AAA (abdominal aortic aneurysm) ([facility])     BEING WATCHED   [facility]   . Arthritis   . CAP (community acquired pneumonia) 10/24-27/14     with SIRS  . UTI (lower urinary tract infection) 08/26/13    Enterococcus and Escherichia coli both sensitive to nitrofurantoin  . Renal insufficiency     Past Surgical History  Procedure Laterality Date  . Cataract extraction      OS  . G1 p1    . Abdominal hysterectomy    . Dilation and curettage of uterus    . Cyst excision Right 05-23-13    Right index finger: cyst  . Spine surgery  Sept. 2013  . Eye surgery      Social History   Social History  . Marital Status: Married    Spouse Name: N/A  . Number of Children: 1  . Years of Education: N/A   Social History Main Topics  . Smoking status: Current Some Day Smoker -- 1.00 packs/day for 50 years    Types: E-cigarettes  . Smokeless tobacco: Never Used  . Alcohol Use: No  . Drug Use: No  . Sexual Activity: Not Asked   Other Topics Concern  . None   Social History Narrative   Has 3 caffeinated bev a day    Family History  Problem Relation Age of Onset  . Coronary artery disease    . Diabetes    . Heart failure Mother   . Heart disease Mother   . Hypertension Mother   . Other Mother     varicose veins  . Deep vein thrombosis Mother   . Varicose Veins Mother   . Heart attack Mother   . Hypertension Sister     X 2  . Diabetes Sister   . Hyperlipidemia Sister   . Other Sister     varicose veins  . Heart disease Father   . Diabetes Father   . Hyperlipidemia Father   . Hypertension Father   . Heart attack Father   . Heart disease Brother     MI in late 22s  . Hyperlipidemia Brother   . Heart attack  Brother   . Parkinsonism Brother   . Heart failure Brother   . Hypertension Daughter     Review of Systems  Constitutional: Negative for fever.  Respiratory: Negative for cough, shortness of breath and wheezing.   Cardiovascular: Positive for leg swelling (mild). Negative  for chest pain and palpitations.  Musculoskeletal: Positive for back pain.  Neurological: Negative for dizziness, light-headedness and headaches.       Objective:   Filed Vitals:   05/21/16 1356  BP: 136/80  Pulse: 86  Temp: 98.2 F (36.8 C)  Resp: 16   Filed Weights   05/21/16 1356  Weight: 163 lb (73.936 kg)   Body mass index is 27.97 kg/(m^2).   Physical Exam Constitutional: Appears well-developed and well-nourished. No distress.  Neck: Neck supple. No tracheal deviation present. No thyromegaly present.  Left carotid bruit. No cervical adenopathy.   Cardiovascular: Normal rate, regular rhythm and normal heart sounds.   No murmur heard.  trace edema Pulmonary/Chest: Effort normal and breath sounds normal. No respiratory distress. No wheezes.  Psych: mood and affect normal       Assessment & Plan:   See Problem List for Assessment and Plan of chronic medical problems.

## 2016-05-21 NOTE — Assessment & Plan Note (Signed)
Taking valium at night

## 2016-06-24 ENCOUNTER — Other Ambulatory Visit: Payer: Self-pay | Admitting: Internal Medicine

## 2016-06-24 ENCOUNTER — Other Ambulatory Visit: Payer: Self-pay

## 2016-06-24 MED ORDER — GLIPIZIDE 5 MG PO TABS: ORAL_TABLET | ORAL | 0 refills | Status: DC

## 2016-06-27 ENCOUNTER — Ambulatory Visit (INDEPENDENT_AMBULATORY_CARE_PROVIDER_SITE_OTHER): Payer: Medicare Other | Admitting: Internal Medicine

## 2016-06-27 ENCOUNTER — Encounter: Payer: Self-pay | Admitting: Internal Medicine

## 2016-06-27 VITALS — BP 132/78 | HR 81 | Ht 64.0 in | Wt 163.0 lb

## 2016-06-27 DIAGNOSIS — E1159 Type 2 diabetes mellitus with other circulatory complications: Secondary | ICD-10-CM | POA: Diagnosis not present

## 2016-06-27 MED ORDER — GLIPIZIDE 5 MG PO TABS: ORAL_TABLET | ORAL | 0 refills | Status: DC

## 2016-06-27 NOTE — Progress Notes (Signed)
Patient ID: Sheila Melton, female   DOB: 1941/10/21, 75 y.o.   MRN: [id number]  HPI: Sheila Melton is a 75 y.o.-year-old female, returning for follow-up for  DM2, dx in ~2006, non-insulin-dependent, controlled, with complications (stage 3 CKD, circulatory complications - AAA). Last visit 2 months ago.  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  Last hemoglobin A1c was: Lab Results  Component Value Date   HGBA1C 7.2 04/29/2016   HGBA1C 6.9 12/31/2015   HGBA1C 6.9 (H) 09/26/2015   Pt is on a regimen of: - Metformin 500 mg daily 2x a day - Glipizide 10 >> 7.5 mg in am and 5 >> 7.5 mg in pm  We stopped Actos 15 mg daily in 10/2015 as she felt that she gained 20 pounds after she started Actos. She was previously on metformin, but this was stopped due to her CKD. She was cleared by her nephrologist, Dr. Posey Pronto to restart metformin.   Pt checks her sugars 1x a day and they are: - am: 135-140 >> 133-154 >> 300 after starting Prednisone >> 150s >> 79-96 - 2h after b'fast: n/c >> 134-167 >> 80-120 >> n/c - before lunch: n/c >> 137 >> 120s >> 69-136, 216x1 - 2h after lunch: n/c >> 124-164 >> n/c >> 78-159 - before dinner: n/c >> 118-135, 174 >> 150-180 >> n/c - 2h after dinner: n/c >> 151-185 >> 220s >> 152-213, 257, 276 - bedtime: n/c >> 128-164 >> n/c - nighttime: n/c No lows. Lowest sugar was 135 >> 80s >> 69; ? hypoglycemia awareness.  Highest sugar was 140 >> 300s >> 276.  Glucometer: One Touch   Pt's meals are: - Breakfast: cereals, bacon + eggs - Lunch: cheese crackers - Dinner: meat + veggies - Snacks: 1 or 2  - + CKD stage 3, last BUN/creatinine:  Lab Results  Component Value Date   BUN 17 09/26/2015   CREATININE 1.05 09/26/2015  GFR 54. On Diovan. - last set of lipids: Lab Results  Component Value Date   CHOL 245 (H) 09/26/2015   HDL 40.90 09/26/2015   LDLCALC 123 (H) 05/24/2014   LDLDIRECT 118.0 09/26/2015   TRIG  322.0 (H) 09/26/2015   CHOLHDL 6 09/26/2015  On Pravastatin. - last eye exam was 04/2016 No DR. Dr Idolina Primer.   - no numbness and tingling in her feet.  She also has HTN, HL.  ROS: Constitutional: no weight loss/gain, no  fatigue, no subjective hypothermia Eyes: no blurry vision, no xerophthalmia ENT: no sore throat, no nodules palpated in throat, no dysphagia/odynophagia, no hoarseness Cardiovascular: no CP/SOB/palpitations/+ leg swelling Respiratory: no cough/SOB Gastrointestinal: no N/V/D/C Musculoskeletal: no muscle/+ joint aches, + back pain Skin: no rashes Neurological: no tremors/numbness/tingling/dizziness  I reviewed pt's medications, allergies, PMH, social hx, family hx, and changes were documented in the history of present illness. Otherwise, unchanged from my initial visit note.  Past Medical History:  Diagnosis Date  . AAA (abdominal aortic aneurysm) (HCC)    BEING WATCHED   [facility]   . Arthritis   . CAP (community acquired pneumonia) 10/24-27/14    with SIRS  . Diabetes mellitus    2  . Family history of anesthesia complication    PATIENTS MOM HAD TROUBLE WAKING UP   . Heart murmur   . Hyperlipidemia   . Hypertension    unspecified essential  . Renal insufficiency   . Subclavian steal syndrome  LEFT SIDE   NO SIDE EFFECTS  . UTI (lower urinary tract infection) 08/26/13   Enterococcus and Escherichia coli both sensitive to nitrofurantoin   Past Surgical History:  Procedure Laterality Date  . ABDOMINAL HYSTERECTOMY    . CATARACT EXTRACTION     OS  . CYST EXCISION Right 05-23-13   Right index finger: cyst  . DILATION AND CURETTAGE OF UTERUS    . EYE SURGERY    . g1 p1    . SPINE SURGERY  Sept. 2013   Social History   Social History  . Marital Status: Married    Spouse Name: N/A  . Number of Children: 1   Occupational History  . retied   Social History Main Topics  . Smoking status: Current Some Day Smoker -- 1.00 packs/day for 50 years    Types:  E-cigarettes  . Smokeless tobacco: Never Used  . Alcohol Use: No  . Drug Use: No  . Sexual Activity: Not on file   Other Topics Concern  . Not on file   Social History Narrative   Has 3 caffeinated bev a day   Current Outpatient Prescriptions on File Prior to Visit  Medication Sig Dispense Refill  . amLODipine (NORVASC) 5 MG tablet TAKE 1 BY MOUTH TWICE DAILY 180 tablet 2  . B Complex-C (SUPER B COMPLEX PO) Take 1 tablet by mouth daily.     . Cholecalciferol (VITAMIN D3) 2000 UNITS TABS Take 2,000 Units by mouth daily.     . diazepam (VALIUM) 5 MG tablet TAKE 1 TABLET AT BEDTIME AS NEEDED. 30 tablet 2  . fexofenadine (ALLEGRA) 180 MG tablet Take 180 mg by mouth at bedtime.     . furosemide (LASIX) 40 MG tablet TAKE 1/2 TO 1 BY MOUTH DAILY AS NEEDED FOR EDEMA 90 tablet 1  . glipiZIDE (GLUCOTROL) 5 MG tablet Take 1.5 tabs in AM, and 1.5 tab in PM daily 90 tablet 0  . glucose blood (ONE TOUCH ULTRA TEST) test strip Use to test blood sugar once daily as instructed. Dx: E11.59 100 each 3  . labetalol (NORMODYNE) 300 MG tablet TAKE 1 BY MOUTH TWICE DAILY (GENERIC FOR NORMODYNE) 180 tablet 2  . meloxicam (MOBIC) 7.5 MG tablet Take 1 tablet (7.5 mg total) by mouth daily. 30 tablet 5  . metFORMIN (GLUCOPHAGE) 500 MG tablet Take 1 tablet (500 mg total) by mouth 2 (two) times daily. 180 tablet 3  . pravastatin (PRAVACHOL) 40 MG tablet TAKE 1 BY MOUTH DAILY 90 tablet 2  . predniSONE (DELTASONE) 20 MG tablet Take 5 mg by mouth once.  5  . traMADol (ULTRAM) 50 MG tablet Take 1 tablet (50 mg total) by mouth daily as needed (for severe back pain). 30 tablet 0  . valsartan (DIOVAN) 320 MG tablet TAKE 1 BY MOUTH AT BEDTIME 90 tablet 2  . vitamin E 400 UNIT capsule Take 400 Units by mouth daily.     No current facility-administered medications on file prior to visit.    Allergies  Allergen Reactions  . Amoxicillin     REACTION: rash  . Rocephin [Ceftriaxone Sodium In Dextrose]     10/24 /14  blisters of palms & diffuse rash Because of a history of documented adverse serious drug reaction;Medi Alert bracelet  is recommended  . Captopril     REACTION: cough  . Simvastatin     REACTION: buttocks cramps  . Adhesive [Tape] Rash   Family History  Problem Relation Age of Onset  .  Coronary artery disease    . Diabetes    . Heart failure Mother   . Heart disease Mother   . Hypertension Mother   . Other Mother     varicose veins  . Deep vein thrombosis Mother   . Varicose Veins Mother   . Heart attack Mother   . Hypertension Sister     X 2  . Diabetes Sister   . Hyperlipidemia Sister   . Other Sister     varicose veins  . Heart disease Father   . Diabetes Father   . Hyperlipidemia Father   . Hypertension Father   . Heart attack Father   . Heart disease Brother     MI in late 33s  . Hyperlipidemia Brother   . Heart attack Brother   . Parkinsonism Brother   . Heart failure Brother   . Hypertension Daughter    PE: BP 132/78 (BP Location: Right Arm, Patient Position: Sitting)   Pulse 81   Ht 5\' 4"  (1.626 m)   Wt 163 lb (73.9 kg)   SpO2 94%   BMI 27.98 kg/m  Body mass index is 27.98 kg/m. Wt Readings from Last 3 Encounters:  06/27/16 163 lb (73.9 kg)  05/21/16 163 lb (73.9 kg)  04/29/16 160 lb 9.6 oz (72.8 kg)   Constitutional: slightly overweight, in NAD Eyes: PERRLA, EOMI, no exophthalmos ENT: moist mucous membranes, no thyromegaly, no cervical lymphadenopathy Cardiovascular: RRR, No MRG, + mild periankle swelling Respiratory: CTA B Gastrointestinal: abdomen soft, NT, ND, BS+ Musculoskeletal: no deformities, strength intact in all 4 Skin: moist, warm, no rashes Neurological: no tremor with outstretched hands, DTR normal in all 4  ASSESSMENT: 1. DM2, non-insulin-dependent, controlled, with complications - CKD stage 3  PLAN:  1. Patient with long-standing, uncontrolled diabetes, on oral antidiabetic regimen, prev. On Metformin 500 mg 2x a day, but we  had to add Glipizide after she started Prednisone for her FSGS. She could not tolerate the high dose of Prednisone, now on 5 mg, with sugars improving. CBGs are lower in am and high after dinner >> will increase the Glipizide before dinner and decrease the Glipizide before b'fast. - I suggested to:  Patient Instructions  Please change: - Glipizide to 5 mg before b'fast and 10 mg before dinner.  Please continue: - Metformin 500 mg daily 2x a day  Please let me know if the sugars are consistently <80 or >200.  Please return in 1.5 months with your sugar log.   - continue checking sugars at different times of the day - check 2x a day, rotating checks - advised for yearly eye exams >> she is UTD with dilated eye exams - checked HbA1c today >> 7.2% (slightly higher) - Return to clinic in 1.5 mo with sugar log   Philemon Kingdom, MD PhD [facility]

## 2016-06-27 NOTE — Patient Instructions (Addendum)
Please change: - Glipizide to 5 mg before b'fast and 10 mg before dinner.  Please continue: - Metformin 500 mg daily 2x a day  Please let me know if the sugars are consistently <80 or >200.  Please return in 1.5 months with your sugar log.

## 2016-07-01 ENCOUNTER — Ambulatory Visit (HOSPITAL_COMMUNITY)
Admission: RE | Admit: 2016-07-01 | Discharge: 2016-07-01 | Disposition: A | Discharge status: Home / Self Care | Payer: Medicare Other | Source: Ambulatory Visit | Attending: Family | Admitting: Family

## 2016-07-01 ENCOUNTER — Encounter (HOSPITAL_COMMUNITY): Payer: Medicare Other

## 2016-07-01 DIAGNOSIS — E785 Hyperlipidemia, unspecified: Secondary | ICD-10-CM | POA: Diagnosis not present

## 2016-07-01 DIAGNOSIS — Z720 Tobacco use: Secondary | ICD-10-CM

## 2016-07-01 DIAGNOSIS — I10 Essential (primary) hypertension: Secondary | ICD-10-CM | POA: Insufficient documentation

## 2016-07-01 DIAGNOSIS — E119 Type 2 diabetes mellitus without complications: Secondary | ICD-10-CM | POA: Insufficient documentation

## 2016-07-01 DIAGNOSIS — Z87891 Personal history of nicotine dependence: Secondary | ICD-10-CM | POA: Diagnosis not present

## 2016-07-01 DIAGNOSIS — Z789 Other specified health status: Secondary | ICD-10-CM | POA: Insufficient documentation

## 2016-07-01 DIAGNOSIS — I714 Abdominal aortic aneurysm, without rupture, unspecified: Secondary | ICD-10-CM

## 2016-07-04 ENCOUNTER — Other Ambulatory Visit: Payer: Self-pay | Admitting: Internal Medicine

## 2016-07-04 ENCOUNTER — Encounter: Payer: Self-pay | Admitting: Family

## 2016-07-04 MED ORDER — TRAMADOL HCL 50 MG PO TABS: 50.0000 mg | ORAL_TABLET | Freq: Every day | ORAL | 0 refills | Status: DC | PRN

## 2016-07-04 NOTE — Telephone Encounter (Signed)
printed

## 2016-07-07 ENCOUNTER — Other Ambulatory Visit: Payer: Self-pay | Admitting: Internal Medicine

## 2016-07-07 NOTE — Telephone Encounter (Signed)
RX faxed to [facility] 

## 2016-07-08 ENCOUNTER — Other Ambulatory Visit: Payer: Self-pay | Admitting: *Deleted

## 2016-07-08 ENCOUNTER — Ambulatory Visit (INDEPENDENT_AMBULATORY_CARE_PROVIDER_SITE_OTHER): Payer: Medicare Other | Admitting: Family

## 2016-07-08 ENCOUNTER — Encounter: Payer: Self-pay | Admitting: Family

## 2016-07-08 VITALS — BP 139/70 | HR 77 | Temp 97.2°F | Resp 16 | Ht 64.0 in | Wt 162.0 lb

## 2016-07-08 DIAGNOSIS — I714 Abdominal aortic aneurysm, without rupture, unspecified: Secondary | ICD-10-CM

## 2016-07-08 DIAGNOSIS — Z789 Other specified health status: Secondary | ICD-10-CM

## 2016-07-08 DIAGNOSIS — Z87891 Personal history of nicotine dependence: Secondary | ICD-10-CM

## 2016-07-08 DIAGNOSIS — R0989 Other specified symptoms and signs involving the circulatory and respiratory systems: Secondary | ICD-10-CM

## 2016-07-08 DIAGNOSIS — I708 Atherosclerosis of other arteries: Secondary | ICD-10-CM

## 2016-07-08 DIAGNOSIS — Z720 Tobacco use: Secondary | ICD-10-CM

## 2016-07-08 DIAGNOSIS — I771 Stricture of artery: Secondary | ICD-10-CM

## 2016-07-08 MED ORDER — TRAMADOL HCL 50 MG PO TABS: 50.0000 mg | ORAL_TABLET | Freq: Every day | ORAL | 0 refills | Status: DC | PRN

## 2016-07-08 NOTE — Patient Instructions (Addendum)
Abdominal Aortic Aneurysm An aneurysm is a weakened or damaged part of an artery wall that bulges from the normal force of blood pumping through the body. An abdominal aortic aneurysm is an aneurysm that occurs in the lower part of the aorta, the main artery of the body.  The major concern with an abdominal aortic aneurysm is that it can enlarge and burst (rupture) or blood can flow between the layers of the wall of the aorta through a tear (aorticdissection). Both of these conditions can cause bleeding inside the body and can be life threatening unless diagnosed and treated promptly. CAUSES  The exact cause of an abdominal aortic aneurysm is unknown. Some contributing factors are:   A hardening of the arteries caused by the buildup of fat and other substances in the lining of a blood vessel (arteriosclerosis).  Inflammation of the walls of an artery (arteritis).   Connective tissue diseases, such as Marfan syndrome.   Abdominal trauma.   An infection, such as syphilis or staphylococcus, in the wall of the aorta (infectious aortitis) caused by bacteria. RISK FACTORS  Risk factors that contribute to an abdominal aortic aneurysm may include:  Age older than 60 years.   High blood pressure (hypertension).  Female gender.  Ethnicity (white race).  Obesity.  Family history of aneurysm (first degree relatives only).  Tobacco use. PREVENTION  The following healthy lifestyle habits may help decrease your risk of abdominal aortic aneurysm:  Quitting smoking. Smoking can raise your blood pressure and cause arteriosclerosis.  Limiting or avoiding alcohol.  Keeping your blood pressure, blood sugar level, and cholesterol levels within normal limits.  Decreasing your salt intake. In somepeople, too much salt can raise blood pressure and increase your risk of abdominal aortic aneurysm.  Eating a diet low in saturated fats and cholesterol.  Increasing your fiber intake by including  whole grains, vegetables, and fruits in your diet. Eating these foods may help lower blood pressure.  Maintaining a healthy weight.  Staying physically active and exercising regularly. SYMPTOMS  The symptoms of abdominal aortic aneurysm may vary depending on the size and rate of growth of the aneurysm.Most grow slowly and do not have any symptoms. When symptoms do occur, they may include:  Pain (abdomen, side, lower back, or groin). The pain may vary in intensity. A sudden onset of severe pain may indicate that the aneurysm has ruptured.  Feeling full after eating only small amounts of food.  Nausea or vomiting or both.  Feeling a pulsating lump in the abdomen.  Feeling faint or passing out. DIAGNOSIS  Since most unruptured abdominal aortic aneurysms have no symptoms, they are often discovered during diagnostic exams for other conditions. An aneurysm may be found during the following procedures:  Ultrasonography (A one-time screening for abdominal aortic aneurysm by ultrasonography is also recommended for all men aged 65-75 years who have ever smoked).  X-ray exams.  A computed tomography (CT).  Magnetic resonance imaging (MRI).  Angiography or arteriography. TREATMENT  Treatment of an abdominal aortic aneurysm depends on the size of your aneurysm, your age, and risk factors for rupture. Medication to control blood pressure and pain may be used to manage aneurysms smaller than 6 cm. Regular monitoring for enlargement may be recommended by your caregiver if:  The aneurysm is 3-4 cm in size (an annual ultrasonography may be recommended).  The aneurysm is 4-4.5 cm in size (an ultrasonography every 6 months may be recommended).  The aneurysm is larger than 4.5 cm in   size (your caregiver may ask that you be examined by a vascular surgeon). If your aneurysm is larger than 6 cm, surgical repair may be recommended. There are two main methods for repair of an aneurysm:   Endovascular  repair (a minimally invasive surgery). This is done most often.  Open repair. This method is used if an endovascular repair is not possible.   This information is not intended to replace advice given to you by your health care provider. Make sure you discuss any questions you have with your health care provider.   Document Released: 08/06/2005 Document Revised: 02/21/2013 Document Reviewed: 11/26/2012 Elsevier Interactive Patient Education 2016 Elsevier Inc.    Before your next abdominal ultrasound:  Take two Extra-Strength Gas-X capsules at bedtime the night before the test. Take another two Extra-Strength Gas-X capsules 3 hours before the test.   

## 2016-07-08 NOTE — Progress Notes (Signed)
[facility] [place]   CC: Follow up Abdominal Aortic Aneurysm  History of Present Illness  Sheila Melton is a 75 y.o. (August 06, 1941) female patient of Dr. Donnetta Hutching who returns for followup of her small asymptomatic infrarenal abdominal aortic aneurysm.  Previous studies demonstrate an AAA, measuring 3.67 cm.  She has had lumbar disc surgery by Dr. Trenton Gammon. She has chronic low back pain, denies radiculopathy pain, she saw Dr. Trenton Gammon recently about this.  She had a bout of colitis about May 2016, she denies abdominal pain since then. Pt states her kidneys were badly affected by IV contrast for an MRI. The patient is former a smokerbut is using vapor cigarettes with nicotine. The patient reports occasional pain in her low back after walking about 10 minutes, she does not walk much due to this pain. She has started meloxicam for this which helps.  The patient denies history of stroke or TIA symptoms. Pt denies family history of AAA.  She had a kidney bx about February 2017, diagnosed with focal segmental glomerulosclerosis, Dr. Posey Pronto is her nephrologist. She takes prednisone for this.   Pt Diabetic: Yes, 7.2 A1C in June 2017 (review of records)  Pt smoker: former smoker, quit cigarettes in 2014, using vapor cigarettes with nicotine, states low nicotine    Past Medical History:  Diagnosis Date  . AAA (abdominal aortic aneurysm) (HCC)    BEING WATCHED   [facility]   . Arthritis   . CAP (community acquired pneumonia) 10/24-27/14    with SIRS  . Diabetes mellitus    2  . Family history of anesthesia complication    PATIENTS MOM HAD TROUBLE WAKING UP   . Heart murmur   . Hyperlipidemia   . Hypertension    unspecified essential  . Renal insufficiency   . Subclavian steal syndrome    LEFT SIDE   NO SIDE EFFECTS  . UTI (lower urinary tract infection) 08/26/13   Enterococcus and Escherichia coli both sensitive to nitrofurantoin   Past Surgical History:  Procedure  Laterality Date  . ABDOMINAL HYSTERECTOMY    . CATARACT EXTRACTION     OS  . CYST EXCISION Right 05-23-13   Right index finger: cyst  . DILATION AND CURETTAGE OF UTERUS    . EYE SURGERY    . g1 p1    . SPINE SURGERY  Sept. 2013   Social History Social History   Social History  . Marital status: Married    Spouse name: N/A  . Number of children: 1  . Years of education: N/A   Occupational History  . Not on file.   Social History Main Topics  . Smoking status: Current Some Day Smoker    Packs/day: 1.00    Years: 50.00    Types: E-cigarettes  . Smokeless tobacco: Never Used     Comment: e-cigs, occasional cigarete  . Alcohol use No  . Drug use: No  . Sexual activity: Not on file   Other Topics Concern  . Not on file   Social History Narrative   Has 3 caffeinated bev a day   Family History Family History  Problem Relation Age of Onset  . Coronary artery disease    . Diabetes    . Heart failure Mother   . Heart disease Mother   . Hypertension Mother   . Other Mother     varicose veins  . Deep vein thrombosis Mother   . Varicose Veins Mother   . Heart  attack Mother   . Hypertension Sister     X 2  . Diabetes Sister   . Hyperlipidemia Sister   . Other Sister     varicose veins  . Heart disease Father   . Diabetes Father   . Hyperlipidemia Father   . Hypertension Father   . Heart attack Father   . Heart disease Brother     MI in late 75s  . Hyperlipidemia Brother   . Heart attack Brother   . Parkinsonism Brother   . Heart failure Brother   . Hypertension Daughter     Current Outpatient Prescriptions on File Prior to Visit  Medication Sig Dispense Refill  . amLODipine (NORVASC) 5 MG tablet TAKE 1 BY MOUTH TWICE DAILY 180 tablet 2  . B Complex-C (SUPER B COMPLEX PO) Take 1 tablet by mouth daily.     . Cholecalciferol (VITAMIN D3) 2000 UNITS TABS Take 2,000 Units by mouth daily.     . diazepam (VALIUM) 5 MG tablet TAKE 1 TABLET AT BEDTIME AS NEEDED. 30  tablet 2  . fexofenadine (ALLEGRA) 180 MG tablet Take 180 mg by mouth at bedtime.     . furosemide (LASIX) 40 MG tablet TAKE 1/2 TO 1 BY MOUTH DAILY AS NEEDED FOR EDEMA 90 tablet 1  . glipiZIDE (GLUCOTROL) 5 MG tablet Take 1 tab in AM, and 2 tab before dinner daily 90 tablet 0  . glucose blood (ONE TOUCH ULTRA TEST) test strip Use to test blood sugar once daily as instructed. Dx: E11.59 100 each 3  . labetalol (NORMODYNE) 300 MG tablet TAKE 1 BY MOUTH TWICE DAILY (GENERIC FOR NORMODYNE) 180 tablet 2  . meloxicam (MOBIC) 7.5 MG tablet Take 1 tablet (7.5 mg total) by mouth daily. 30 tablet 5  . metFORMIN (GLUCOPHAGE) 500 MG tablet Take 1 tablet (500 mg total) by mouth 2 (two) times daily. 180 tablet 3  . pravastatin (PRAVACHOL) 40 MG tablet TAKE 1 BY MOUTH DAILY 90 tablet 2  . predniSONE (DELTASONE) 20 MG tablet Take 5 mg by mouth once.  5  . traMADol (ULTRAM) 50 MG tablet Take 1 tablet (50 mg total) by mouth daily as needed (for severe back pain). 30 tablet 0  . valsartan (DIOVAN) 320 MG tablet TAKE 1 BY MOUTH AT BEDTIME 90 tablet 2  . vitamin E 400 UNIT capsule Take 400 Units by mouth daily.     No current facility-administered medications on file prior to visit.    Allergies  Allergen Reactions  . Amoxicillin     REACTION: rash  . Rocephin [Ceftriaxone Sodium In Dextrose]     10/24 /14 blisters of palms & diffuse rash Because of a history of documented adverse serious drug reaction;Medi Alert bracelet  is recommended  . Captopril     REACTION: cough  . Simvastatin     REACTION: buttocks cramps  . Adhesive [Tape] Rash    ROS: See HPI for pertinent positives and negatives.  Physical Examination  Vitals:   07/08/16 1259  BP: 139/70  Pulse: 77  Resp: 16  Temp: 97.2 F (36.2 C)  Weight: 162 lb (73.5 kg)  Height: 5\' 4"  (1.626 m)   Body mass index is 27.81 kg/m.  General: A&O x 3, WD.  Pulmonary: Sym exp, good air movt, CTAB, no rales, rhonchi, or wheezing.  Cardiac:  RRR, Nl S1, S2, no detected murmur.   Carotid Bruits Right Left   negative positive  Aorta is not palpable Radial pulses are  2+ right, 1+ left   VASCULAR EXAM:     LE Pulses Right Left   FEMORAL not palpable(obese) not palpable    POPLITEAL not palpable  not palpable   POSTERIOR TIBIAL 2+ palpable  1+ palpable    DORSALIS PEDIS  ANTERIOR TIBIAL 1+ palpable  not palpable      Gastrointestinal: soft, NTND, -G/R, - HSM, - palpable masses, - CVAT B.  Musculoskeletal: M/S 5/5 in UE's, 4/5 in LE's, Extremities without ischemic changes.  Neurologic: CN 2-12 intact, Motor exam as listed above.   03/07/16 Carotid duplex:  1-39% bilateral ICA stenosis, essentially stable. Normal right vertebral, subclavian and brachial artery flow. 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   Non-Invasive Vascular Imaging  AAA Duplex (07/08/2016)  Previous size: 4.1 cm (Date: 06/26/15)  Current size:  4.1 cm (Date: 07/01/16)  Medical Decision Making  The patient is a 75 y.o. female who presents with asymptomatic AAA with no increase in size. The common iliac arteries were not well visualized due to excessive bowel gas.   She was evaluated by Dr. 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 Pt states she will see Dr. Fletcher Anon in a year from that visit, with another carotid duplex.   Based on this patient's exam and diagnostic studies, the patient will follow up in 1 year with the following studies: AAA duplex.  Consideration for repair of AAA would be made when the size is 5.5 cm, growth > 1 cm/yr, and symptomatic status.  I emphasized the importance of maximal medical management including strict  control of blood pressure, blood glucose, and lipid levels, antiplatelet agents, obtaining regular exercise, and continued cessation of smoking.   The patient was given information about AAA including signs, symptoms, treatment, and how to minimize the risk of enlargement and rupture of aneurysms.    The patient was advised to call 911 should the patient experience sudden onset abdominal or back pain.   Thank you for allowing [place] to participate in this patient's care.  Clemon Chambers, RN, MSN, FNP-C [facility] [place] Office: [facility] Physician: Early  07/08/2016, 1:01 PM

## 2016-07-09 ENCOUNTER — Encounter: Payer: Self-pay | Admitting: Family

## 2016-07-21 ENCOUNTER — Other Ambulatory Visit: Payer: Self-pay | Admitting: Internal Medicine

## 2016-07-30 ENCOUNTER — Other Ambulatory Visit: Payer: Self-pay | Admitting: Internal Medicine

## 2016-07-30 NOTE — Telephone Encounter (Signed)
RX faxed to [facility] 

## 2016-08-08 ENCOUNTER — Ambulatory Visit (INDEPENDENT_AMBULATORY_CARE_PROVIDER_SITE_OTHER): Payer: Medicare Other

## 2016-08-08 DIAGNOSIS — Z23 Encounter for immunization: Secondary | ICD-10-CM | POA: Diagnosis not present

## 2016-08-15 ENCOUNTER — Ambulatory Visit (INDEPENDENT_AMBULATORY_CARE_PROVIDER_SITE_OTHER): Payer: Medicare Other | Admitting: Internal Medicine

## 2016-08-15 ENCOUNTER — Encounter: Payer: Self-pay | Admitting: Internal Medicine

## 2016-08-15 VITALS — BP 150/64 | HR 87 | Temp 98.2°F | Resp 16 | Wt 165.0 lb

## 2016-08-15 DIAGNOSIS — N051 Unspecified nephritic syndrome with focal and segmental glomerular lesions: Secondary | ICD-10-CM | POA: Diagnosis not present

## 2016-08-15 DIAGNOSIS — R238 Other skin changes: Secondary | ICD-10-CM

## 2016-08-15 DIAGNOSIS — F329 Major depressive disorder, single episode, unspecified: Secondary | ICD-10-CM | POA: Insufficient documentation

## 2016-08-15 DIAGNOSIS — E1159 Type 2 diabetes mellitus with other circulatory complications: Secondary | ICD-10-CM

## 2016-08-15 DIAGNOSIS — F411 Generalized anxiety disorder: Secondary | ICD-10-CM

## 2016-08-15 DIAGNOSIS — F32A Depression, unspecified: Secondary | ICD-10-CM | POA: Insufficient documentation

## 2016-08-15 DIAGNOSIS — I10 Essential (primary) hypertension: Secondary | ICD-10-CM | POA: Diagnosis not present

## 2016-08-15 DIAGNOSIS — R233 Spontaneous ecchymoses: Secondary | ICD-10-CM

## 2016-08-15 MED ORDER — FLUOXETINE HCL 10 MG PO CAPS: 10.0000 mg | ORAL_CAPSULE | Freq: Every day | ORAL | 3 refills | Status: DC

## 2016-08-15 NOTE — Assessment & Plan Note (Signed)
Lab Results  Component Value Date   HGBA1C 7.2 04/29/2016   Sugars fairly controlled Management per Dr. Cruzita Lederer

## 2016-08-15 NOTE — Assessment & Plan Note (Signed)
Controlled, but she still has breakthrough anxiety Continue Valium as needed We will start fluoxetine 10 mg daily for depression and anxiety

## 2016-08-15 NOTE — Assessment & Plan Note (Signed)
Has follow-up scheduled with nephrology Tapering off of the steroids

## 2016-08-15 NOTE — Progress Notes (Signed)
Subjective:    Patient ID: Sheila Melton, female    DOB: 1941-03-31, 75 y.o.   MRN: [id number]  HPI The patient is here for follow up.  Diabetes: she is following with Dr Cruzita Lederer.  She is taking her medication daily as prescribed. She is compliant with a diabetic diet. She is not exercising regularly due to her back pain. She monitors her sugars and they have been good in the morning, but higher in the evening.   Hypertension: She is taking her medication daily. She is compliant with a low sodium diet.  She denies chest pain, palpitations, shortness of breath and regular headaches. She is not exercising regularly.  She does not monitor her blood pressure at home, But has been lower when she is seen other specialists.    Hyperlipidemia: She is taking her medication daily. She is compliant with a low fat/cholesterol diet. She is not exercising regularly. She denies myalgias.   CKD, FSGS:  She is weaning off the prednisone and will be off by the end of the month.  She is following with [facility].  She most likely will need to go on another medication and is slightly anxious about that.  Anxiety, depression:  She has been very depressed recently.  She wonders if it is related to coming off the prednisone or everything going on in her life.  She also has chronic back pain that limits her and that is probably contributing as well. She is taking the valium a night and that works well for her anxiety.  She occasional takes a 1/2 of valium during the day.    She wonders if she needs to go on something like Prozac-her husband and daughter are both on and do well.  Easy bruising: She hit her arm coming down the stairs the other day and she has bruises on her left arm. She has also had increased bruising on her right arm. Increased bruising started over the summer and she states it does correlate to when she started prednisone. There have not been any other medication changes. She was  concerned because her mother and maternal bleeding, but she was on a blood thinner.  Medications and allergies reviewed with patient and updated if appropriate.  Patient Active Problem List   Diagnosis Date Noted  . 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 CKD (chronic kidney disease) 05/21/2016  . Sleep difficulties 01/18/2016  . Type 2 diabetes mellitus with circulatory disorder, without long-term current use of insulin ([place]) 10/23/2015  . Numbness of toes-Right foot 06/26/2015  . Proteinuria 03/26/2015  . Colitis 03/19/2015  . Nephrolithiasis 03/19/2015  . AAA (abdominal aortic aneurysm) without rupture ([place]) 03/19/2015  . HLD (hyperlipidemia) 03/19/2015  . Anxiety state 03/19/2015  . Skin cancer 01/09/2015  . Former smoker 05/24/2014  . CAP (community acquired pneumonia) 09/03/2013  . Bilateral renal cysts 09/16/2012  . 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[place]) 07/26/2012  . Degenerative spondylolisthesis 07/26/2012  . CHRONIC OBSTRUCTIVE PULMONARY DISEASE, ACUTE EXACERBATION 09/04/2010  . DIARRHEA 07/24/2010  . SHOULDER PAIN, LEFT, CHRONIC 07/25/2009  . MUSCLE PAIN 06/11/2009  . CARDIAC MURMUR, SYSTOLIC 24/07/7352  . Mixed hyperlipidemia 12/30/2007  . Tobacco use disorder 12/30/2007  . Essential hypertension 12/30/2007  . Subclavian steal syndrome 12/30/2007  . Palpitations 12/30/2007  . CAROTID BRUIT 04/07/2007    Current Outpatient Prescriptions on File Prior to Visit  Medication Sig Dispense Refill  . amLODipine (NORVASC) 5 MG tablet TAKE 1 BY MOUTH TWICE  DAILY 180 tablet 2  . B Complex-C (SUPER B COMPLEX PO) Take 1 tablet by mouth daily.     . Cholecalciferol (VITAMIN D3) 2000 UNITS TABS Take 2,000 Units by mouth daily.     . diazepam (VALIUM) 5 MG tablet TAKE 1 TABLET AT BEDTIME AS NEEDED. 30 tablet 2  . fexofenadine (ALLEGRA) 180 MG tablet Take 180 mg by mouth at bedtime.     . furosemide (LASIX) 40 MG tablet TAKE 1/2 TO 1 BY MOUTH  DAILY AS NEEDED FOR EDEMA 90 tablet 1  . glipiZIDE (GLUCOTROL) 5 MG tablet TAKE 1 & 1/2 TABLETS TWICE DAILY. 90 tablet 0  . glucose blood (ONE TOUCH ULTRA TEST) test strip Use to test blood sugar once daily as instructed. Dx: E11.59 100 each 3  . labetalol (NORMODYNE) 300 MG tablet TAKE 1 BY MOUTH TWICE DAILY (GENERIC FOR NORMODYNE) 180 tablet 2  . meloxicam (MOBIC) 7.5 MG tablet Take 1 tablet (7.5 mg total) by mouth daily. 30 tablet 5  . metFORMIN (GLUCOPHAGE) 500 MG tablet Take 1 tablet (500 mg total) by mouth 2 (two) times daily. 180 tablet 3  . pravastatin (PRAVACHOL) 40 MG tablet TAKE 1 BY MOUTH DAILY 90 tablet 2  . predniSONE (DELTASONE) 20 MG tablet Take 5 mg by mouth once.  5  . traMADol (ULTRAM) 50 MG tablet Take 1 tablet (50 mg total) by mouth daily as needed (for severe back pain). 30 tablet 0  . valsartan (DIOVAN) 320 MG tablet TAKE 1 BY MOUTH AT BEDTIME 90 tablet 2  . vitamin E 400 UNIT capsule Take 400 Units by mouth daily.     No current facility-administered medications on file prior to visit.     Past Medical History:  Diagnosis Date  . AAA (abdominal aortic aneurysm) (HCC)    BEING WATCHED   [facility]   . Arthritis   . CAP (community acquired pneumonia) 10/24-27/14    with SIRS  . Diabetes mellitus    2  . Family history of anesthesia complication    PATIENTS MOM HAD TROUBLE WAKING UP   . Heart murmur   . Hyperlipidemia   . Hypertension    unspecified essential  . Renal insufficiency   . Subclavian steal syndrome    LEFT SIDE   NO SIDE EFFECTS  . UTI (lower urinary tract infection) 08/26/13   Enterococcus and Escherichia coli both sensitive to nitrofurantoin    Past Surgical History:  Procedure Laterality Date  . ABDOMINAL HYSTERECTOMY    . CATARACT EXTRACTION     OS  . CYST EXCISION Right 05-23-13   Right index finger: cyst  . DILATION AND CURETTAGE OF UTERUS    . EYE SURGERY    . g1 p1    . SPINE SURGERY  Sept. 2013    Social History   Social History    . Marital status: Married    Spouse name: N/A  . Number of children: 1  . Years of education: N/A   Social History Main Topics  . Smoking status: Current Some Day Smoker    Packs/day: 1.00    Years: 50.00    Types: E-cigarettes  . Smokeless tobacco: Never Used     Comment: e-cigs, occasional cigarete  . Alcohol use No  . Drug use: No  . Sexual activity: Not on file   Other Topics Concern  . Not on file   Social History Narrative   Has 3 caffeinated bev a day    Family  History  Problem Relation Age of Onset  . Coronary artery disease    . Diabetes    . Heart failure Mother   . Heart disease Mother   . Hypertension Mother   . Other Mother     varicose veins  . Deep vein thrombosis Mother   . Varicose Veins Mother   . Heart attack Mother   . Hypertension Sister     X 2  . Diabetes Sister   . Hyperlipidemia Sister   . Other Sister     varicose veins  . Heart disease Father   . Diabetes Father   . Hyperlipidemia Father   . Hypertension Father   . Heart attack Father   . Heart disease Brother     MI in late 31s  . Hyperlipidemia Brother   . Heart attack Brother   . Parkinsonism Brother   . Heart failure Brother   . Hypertension Daughter     Review of Systems  Constitutional: Negative for fever.  Respiratory: Negative for cough, shortness of breath and wheezing.   Cardiovascular: Positive for leg swelling (very mild). Negative for chest pain and palpitations.  Neurological: Negative for light-headedness and headaches.  Hematological: Bruises/bleeds easily.       Objective:   Vitals:   08/15/16 1108  BP: (!) 150/64  Pulse: 87  Resp: 16  Temp: 98.2 F (36.8 C)   Filed Weights   08/15/16 1108  Weight: 165 lb (74.8 kg)   Body mass index is 28.32 kg/m.   Physical Exam    Constitutional: Appears well-developed and well-nourished. No distress.  HENT:  Head: Normocephalic and atraumatic.  Neck: Neck supple. No tracheal deviation present. No  thyromegaly present.  No cervical lymphadenopathy Cardiovascular: Normal rate, regular rhythm and normal heart sounds.   No murmur heard. No carotid bruit .  No edema Pulmonary/Chest: Effort normal and breath sounds normal. No respiratory distress. No has no wheezes. No rales.  Skin: Skin is warm and dry. Not diaphoretic.  bruises on left arm that are bright red in nature and appeared to be related to medication  Psychiatric: Depressed, slightly anxious affect. Behavior is normal.      Assessment & Plan:    See Problem List for Assessment and Plan of chronic medical problems.

## 2016-08-15 NOTE — Progress Notes (Signed)
Pre visit review using our clinic review tool, if applicable. No additional management support is needed unless otherwise documented below in the visit note. 

## 2016-08-15 NOTE — Patient Instructions (Signed)
   All other Health Maintenance issues reviewed.   All recommended immunizations and age-appropriate screenings are up-to-date or discussed.  No immunizations administered today.   Medications reviewed and updated.  Changes include starting prozac 10 mg daily.  Your prescription(s) have been submitted to your pharmacy. Please take as directed and contact our office if you believe you are having problem(s) with the medication(s).

## 2016-08-15 NOTE — Assessment & Plan Note (Signed)
Blood pressure slightly elevated here today, but the last several readings have been good Continue current medications at current doses CMP will be checked soon by nephrology

## 2016-08-15 NOTE — Assessment & Plan Note (Signed)
Experiencing depression recently, likely related to several things-life stresses, being on prednisone and side effects from medication and dealing with chronic pain in her back that limits her significantly Start fluoxetine 10 mg daily

## 2016-08-15 NOTE — Assessment & Plan Note (Signed)
Bruising on arms related to leg trauma Likely related to a combination of medications she is taking We discussed this at length No concerning bruising by history or noted on exam We'll defer blood work at this time If the bruising persists or worsens she will let me know

## 2016-08-18 ENCOUNTER — Other Ambulatory Visit: Payer: Self-pay | Admitting: Internal Medicine

## 2016-08-21 ENCOUNTER — Encounter: Payer: Self-pay | Admitting: Internal Medicine

## 2016-08-23 ENCOUNTER — Other Ambulatory Visit: Payer: Self-pay | Admitting: Internal Medicine

## 2016-08-25 ENCOUNTER — Ambulatory Visit (INDEPENDENT_AMBULATORY_CARE_PROVIDER_SITE_OTHER): Payer: Medicare Other | Admitting: Internal Medicine

## 2016-08-25 ENCOUNTER — Encounter: Payer: Self-pay | Admitting: Internal Medicine

## 2016-08-25 VITALS — BP 122/74 | HR 88 | Ht 64.0 in | Wt 166.0 lb

## 2016-08-25 DIAGNOSIS — E1159 Type 2 diabetes mellitus with other circulatory complications: Secondary | ICD-10-CM | POA: Diagnosis not present

## 2016-08-25 LAB — POCT GLYCOSYLATED HEMOGLOBIN (HGB A1C): Hemoglobin A1C: 6.6

## 2016-08-25 NOTE — Progress Notes (Signed)
Patient ID: Sheila Melton, female   DOB: 1941-09-24, 75 y.o.   MRN: [id number]  HPI: Sheila Melton is a 75 y.o.-year-old female, returning for follow-up for  DM2, dx in ~2006, non-insulin-dependent, controlled, with complications (stage 3 CKD, circulatory complications - AAA). Last visit 2 months ago.  She was dx'ed with FSGS (Dr Posey Pronto) and started Prednisone 40 mg >> decreased to 5 mg daily >> now on 5 mg 2x a week >> will stop completely this week.  Last hemoglobin A1c was: Lab Results  Component Value Date   HGBA1C 7.2 04/29/2016   HGBA1C 6.9 12/31/2015   HGBA1C 6.9 (H) 09/26/2015   Pt is on a regimen of: - Metformin 500 mg daily 2x a day - Glipizide 10 >> 7.5 >> 5 mg in am and 5 >> 7.5 >> 10 mg in pm  We stopped Actos 15 mg daily in 10/2015 as she felt that she gained 20 pounds after she started Actos. She was previously on metformin, but this was stopped due to her CKD. She was cleared by her nephrologist, Dr. Posey Pronto to restart metformin.   Pt checks her sugars 1x a day and they are: - am: 135-140 >> 133-154 >> 300 after starting Prednisone >> 150s >> 79-96 >> 86, 102-134, 148 - 2h after b'fast: n/c >> 134-167 >> 80-120 >> n/c >> 111-142, 154 - before lunch: n/c >> 137 >> 120s >> 69-136, 216x1 >> 234 x1 - 2h after lunch: n/c >> 124-164 >> n/c >> 78-159 >> 127 - before dinner: n/c >> 118-135, 174 >> 150-180 >> n/c >> 137, 183 - 2h after dinner: n/c >> 151-185 >> 220s >> 152-213, 257, 276 >> 124-225 - bedtime: n/c >> 128-164 >> n/c - nighttime: n/c No lows. Lowest sugar was 135 >> 80s >> 69 >> 86; ? hypoglycemia awareness.  Highest sugar was 140 >> 300s >> 276 >> 297.  Glucometer: One Touch   Pt's meals are: - Breakfast: cereals, bacon + eggs - Lunch: cheese crackers - Dinner: meat + veggies - Snacks: 1 or 2  - + CKD stage 3, last BUN/creatinine:  Lab Results  Component Value Date   BUN 17 09/26/2015   CREATININE 1.05 09/26/2015  On Diovan. - last set of lipids: Lab  Results  Component Value Date   CHOL 245 (H) 09/26/2015   HDL 40.90 09/26/2015   LDLCALC 123 (H) 05/24/2014   LDLDIRECT 118.0 09/26/2015   TRIG 322.0 (H) 09/26/2015   CHOLHDL 6 09/26/2015  On Pravastatin. - last eye exam was 04/2016 No DR. Dr Idolina Primer.   - no numbness and tingling in her feet.  She also has HTN, HL.  ROS: Constitutional: no weight loss/gain, no  fatigue, no subjective hypothermia Eyes: no blurry vision, no xerophthalmia ENT: no sore throat, no nodules palpated in throat, no dysphagia/odynophagia, no hoarseness Cardiovascular: no CP/SOB/palpitations/+ leg swelling Respiratory: no cough/SOB Gastrointestinal: no N/V/D/C/+ heartburn Musculoskeletal: no muscle/no joint aches Skin: no rashes, + easy bruising Neurological: no tremors/numbness/tingling/dizziness  I reviewed pt's medications, allergies, PMH, social hx, family hx, and changes were documented in the history of present illness. Otherwise, unchanged from my initial visit note.  Past Medical History:  Diagnosis Date  . AAA (abdominal aortic aneurysm) (HCC)    BEING WATCHED   [facility]   . Arthritis   . CAP (community acquired pneumonia) 10/24-27/14    with SIRS  . Diabetes mellitus    2  . Family history of anesthesia complication  PATIENTS MOM HAD TROUBLE WAKING UP   . Heart murmur   . Hyperlipidemia   . Hypertension    unspecified essential  . Renal insufficiency   . Subclavian steal syndrome    LEFT SIDE   NO SIDE EFFECTS  . UTI (lower urinary tract infection) 08/26/13   Enterococcus and Escherichia coli both sensitive to nitrofurantoin   Past Surgical History:  Procedure Laterality Date  . ABDOMINAL HYSTERECTOMY    . CATARACT EXTRACTION     OS  . CYST EXCISION Right 05-23-13   Right index finger: cyst  . DILATION AND CURETTAGE OF UTERUS    . EYE SURGERY    . g1 p1    . SPINE SURGERY  Sept. 2013   Social History   Social History  . Marital Status: Married    Spouse Name: N/A  . Number of  Children: 1   Occupational History  . retied   Social History Main Topics  . Smoking status: Current Some Day Smoker -- 1.00 packs/day for 50 years    Types: E-cigarettes  . Smokeless tobacco: Never Used  . Alcohol Use: No  . Drug Use: No  . Sexual Activity: Not on file   Other Topics Concern  . Not on file   Social History Narrative   Has 3 caffeinated bev a day   Current Outpatient Prescriptions on File Prior to Visit  Medication Sig Dispense Refill  . amLODipine (NORVASC) 5 MG tablet TAKE 1 BY MOUTH TWICE DAILY 180 tablet 2  . B Complex-C (SUPER B COMPLEX PO) Take 1 tablet by mouth daily.     . Cholecalciferol (VITAMIN D3) 2000 UNITS TABS Take 2,000 Units by mouth daily.     . diazepam (VALIUM) 5 MG tablet TAKE 1 TABLET AT BEDTIME AS NEEDED. 30 tablet 2  . fexofenadine (ALLEGRA) 180 MG tablet Take 180 mg by mouth at bedtime.     Marland Kitchen FLUoxetine (PROZAC) 10 MG capsule Take 1 capsule (10 mg total) by mouth daily. 90 capsule 3  . furosemide (LASIX) 40 MG tablet TAKE 1/2 TO 1 TABLET DAILY AS NEEDED FOR FLUID 90 tablet 1  . glipiZIDE (GLUCOTROL) 5 MG tablet TAKE 1 & 1/2 TABLETS TWICE DAILY. 90 tablet 0  . glucose blood (ONE TOUCH ULTRA TEST) test strip Use to test blood sugar once daily as instructed. Dx: E11.59 100 each 3  . labetalol (NORMODYNE) 300 MG tablet TAKE 1 BY MOUTH TWICE DAILY (GENERIC FOR NORMODYNE) 180 tablet 2  . meloxicam (MOBIC) 7.5 MG tablet Take 1 tablet (7.5 mg total) by mouth daily. 30 tablet 5  . metFORMIN (GLUCOPHAGE) 500 MG tablet Take 1 tablet (500 mg total) by mouth 2 (two) times daily. 180 tablet 3  . pravastatin (PRAVACHOL) 40 MG tablet TAKE 1 BY MOUTH DAILY 90 tablet 2  . traMADol (ULTRAM) 50 MG tablet Take 1 tablet (50 mg total) by mouth daily as needed (for severe back pain). 30 tablet 0  . valsartan (DIOVAN) 320 MG tablet TAKE 1 BY MOUTH AT BEDTIME 90 tablet 2  . vitamin E 400 UNIT capsule Take 400 Units by mouth daily.    . predniSONE (DELTASONE) 20 MG  tablet Take 5 mg by mouth once.  5   No current facility-administered medications on file prior to visit.    Allergies  Allergen Reactions  . Amoxicillin     REACTION: rash  . Rocephin [Ceftriaxone Sodium In Dextrose]     10/24 /14 blisters of palms &  diffuse rash Because of a history of documented adverse serious drug reaction;Medi Alert bracelet  is recommended  . Captopril     REACTION: cough  . Simvastatin     REACTION: buttocks cramps  . Adhesive [Tape] Rash   Family History  Problem Relation Age of Onset  . Coronary artery disease    . Diabetes    . Heart failure Mother   . Heart disease Mother   . Hypertension Mother   . Other Mother     varicose veins  . Deep vein thrombosis Mother   . Varicose Veins Mother   . Heart attack Mother   . Hypertension Sister     X 2  . Diabetes Sister   . Hyperlipidemia Sister   . Other Sister     varicose veins  . Heart disease Father   . Diabetes Father   . Hyperlipidemia Father   . Hypertension Father   . Heart attack Father   . Heart disease Brother     MI in late 51s  . Hyperlipidemia Brother   . Heart attack Brother   . Parkinsonism Brother   . Heart failure Brother   . Hypertension Daughter    PE: BP 122/74 (BP Location: Left Arm, Patient Position: Sitting)   Pulse 88   Ht 5\' 4"  (1.626 m)   Wt 166 lb (75.3 kg)   SpO2 94%   BMI 28.49 kg/m  Body mass index is 28.49 kg/m. Wt Readings from Last 3 Encounters:  08/25/16 166 lb (75.3 kg)  08/15/16 165 lb (74.8 kg)  07/08/16 162 lb (73.5 kg)   Constitutional: slightly overweight, in NAD Eyes: PERRLA, EOMI, no exophthalmos ENT: moist mucous membranes, no thyromegaly, no cervical lymphadenopathy Cardiovascular: RRR, No MRG, + mild periankle swelling Respiratory: CTA B Gastrointestinal: abdomen soft, NT, ND, BS+ Musculoskeletal: no deformities, strength intact in all 4 Skin: moist, warm, + palmar erythrosis, + bruises on arms Neurological: no tremor with  outstretched hands, DTR normal in all 4  ASSESSMENT: 1. DM2, non-insulin-dependent, controlled, with complications - CKD stage 3  PLAN:  1. Patient with long-standing, uncontrolled diabetes, on oral antidiabetic regimen, prev. On Metformin 500 mg 2x a day, but we had to add Glipizide after she started Prednisone for her FSGS. Sugars are at or close to target in am but high at bedtime. She will stop Prednisone after this week >> will continue current regimen and will see if she needs intensification of tx at last visit (Januvia?). - discussed the need to improve dinner - I suggested to:  Patient Instructions  Please continue: - Glipizide to 5 mg before b'fast and 10 mg before dinner. - Metformin 500 mg daily 2x a day  Please return in 1.5 months with your sugar log.   - continue checking sugars at different times of the day - check 2x a day, rotating checks - advised for yearly eye exams >> she is UTD with dilated eye exams - UTD with flu shot - checked HbA1c today >> 6.6% (improved) - Return to clinic in 1.5 mo with sugar log   Philemon Kingdom, MD PhD [facility]

## 2016-08-25 NOTE — Patient Instructions (Addendum)
Patient Instructions  Please continue: - Glipizide to 5 mg before b'fast and 10 mg before dinner. - Metformin 500 mg daily 2x a day  Please return in 1.5 months with your sugar log.

## 2016-09-21 ENCOUNTER — Other Ambulatory Visit: Payer: Self-pay | Admitting: Internal Medicine

## 2016-10-17 ENCOUNTER — Ambulatory Visit: Payer: Medicare Other | Admitting: Internal Medicine

## 2016-10-21 ENCOUNTER — Encounter: Payer: Self-pay | Admitting: Internal Medicine

## 2016-10-21 ENCOUNTER — Ambulatory Visit (INDEPENDENT_AMBULATORY_CARE_PROVIDER_SITE_OTHER): Payer: Medicare Other | Admitting: Internal Medicine

## 2016-10-21 VITALS — BP 128/64 | HR 87 | Ht 65.0 in | Wt 167.8 lb

## 2016-10-21 DIAGNOSIS — E1159 Type 2 diabetes mellitus with other circulatory complications: Secondary | ICD-10-CM | POA: Diagnosis not present

## 2016-10-21 MED ORDER — GLIPIZIDE 5 MG PO TABS: ORAL_TABLET | ORAL | 3 refills | Status: DC

## 2016-10-21 MED ORDER — ONETOUCH ULTRA 2 W/DEVICE KIT: PACK | 0 refills | Status: AC

## 2016-10-21 NOTE — Progress Notes (Signed)
Patient ID: Sheila Melton, female   DOB: 12-24-1940, 75 y.o.   MRN: [id number]  HPI: Sheila Melton is a 75 y.o.-year-old female, returning for follow-up for  DM2, dx in ~2006, non-insulin-dependent, controlled, with complications (stage 3 CKD, circulatory complications - AAA). Last visit 2 months ago.  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 She saw Dr. Posey Pronto yesterday >> kidney fxn is stable. He suggested another medication, not a steroid  - may start this in 01/2017.  She started Prozac ~ beginning of 09/2016. She feels much better on this. Sugars were higher before she started Prozac and then started to slowly decrease, now much better in the last 2 weeks.  Last hemoglobin A1c was: Lab Results  Component Value Date   HGBA1C 6.6 08/25/2016   HGBA1C 7.2 04/29/2016   HGBA1C 6.9 12/31/2015   Pt is on a regimen of: - Metformin 500 mg daily 2x a day - Glipizide 10 >> 7.5 >> 5 mg in am and 5 >> 7.5 >> 10 mg in pm  We stopped Actos 15 mg daily in 10/2015 as she felt that she gained 20 pounds after she started Actos. She was previously on metformin, but this was stopped due to her CKD. She was cleared by her nephrologist, Dr. Posey Pronto to restart metformin.   Pt checks her sugars 1x a day and they are: - am: 300 after starting Prednisone >> 150s >> 79-96 >> 86, 102-134, 148 >> 102-130, 158 - in last 2 weeks - 2h after b'fast: n/c >> 134-167 >> 80-120 >> n/c >> 111-142, 154 >> 125-161 - before lunch: n/c >> 137 >> 120s >> 69-136, 216x1 >> 234 x1 >> 171, 180 - 2h after lunch: n/c >> 124-164 >> n/c >> 78-159 >> 127 >> 133, 149 in last 2 weeks - before dinner: n/c >> 118-135, 174 >> 150-180 >> n/c >> 137, 183 >> 80 - 2h after dinner: n/c >> 151-185 >> 220s >> 152-213, 257, 276 >> 124-225 >> 96-162, 220 - bedtime: n/c >> 128-164 >> n/c - nighttime: n/c No lows. Lowest sugar was 86 >> 80; ? hypoglycemia awareness.  Highest sugar was 297 >>  254.  Glucometer: One Touch   Pt's meals are: - Breakfast: cereals, bacon + eggs - Lunch: cheese crackers - Dinner: meat + veggies - Snacks: 1 or 2  - + CKD stage 3, last BUN/creatinine:  Lab Results  Component Value Date   BUN 17 09/26/2015   CREATININE 1.05 09/26/2015  On Diovan. - last set of lipids: Lab Results  Component Value Date   CHOL 245 (H) 09/26/2015   HDL 40.90 09/26/2015   LDLCALC 123 (H) 05/24/2014   LDLDIRECT 118.0 09/26/2015   TRIG 322.0 (H) 09/26/2015   CHOLHDL 6 09/26/2015  On Pravastatin. - last eye exam was 04/2016 No DR. Dr Idolina Primer.   - no numbness and tingling in her feet.  She also has HTN, HL.  ROS: Constitutional: no weight loss/gain, +  fatigue, no subjective hypothermia Eyes: no blurry vision, no xerophthalmia ENT: no sore throat, no nodules palpated in throat, no dysphagia/odynophagia, no hoarseness Cardiovascular: no CP/SOB/palpitations/+ leg swelling Respiratory: no cough/SOB Gastrointestinal: no N/V/D/C/heartburn Musculoskeletal: no muscle/no joint aches Skin: no rashes Neurological: no tremors/numbness/tingling/dizziness  I reviewed pt's medications, allergies, PMH, social hx, family hx, and changes were documented in the history of present illness. Otherwise, unchanged from my initial visit note.  Past Medical History:  Diagnosis Date  . AAA (abdominal aortic aneurysm) (HCC)    BEING WATCHED   [facility]   . Arthritis   . CAP (community acquired pneumonia) 10/24-27/14    with SIRS  . Diabetes mellitus    2  . Family history of anesthesia complication    PATIENTS MOM HAD TROUBLE WAKING UP   . Heart murmur   . Hyperlipidemia   . Hypertension    unspecified essential  . Renal insufficiency   . Subclavian steal syndrome    LEFT SIDE   NO SIDE EFFECTS  . UTI (lower urinary tract infection) 08/26/13   Enterococcus and Escherichia coli both sensitive to nitrofurantoin   Past Surgical History:  Procedure Laterality Date  . ABDOMINAL  HYSTERECTOMY    . CATARACT EXTRACTION     OS  . CYST EXCISION Right 05-23-13   Right index finger: cyst  . DILATION AND CURETTAGE OF UTERUS    . EYE SURGERY    . g1 p1    . SPINE SURGERY  Sept. 2013   Social History   Social History  . Marital Status: Married    Spouse Name: N/A  . Number of Children: 1   Occupational History  . retied   Social History Main Topics  . Smoking status: Current Some Day Smoker -- 1.00 packs/day for 50 years    Types: E-cigarettes  . Smokeless tobacco: Never Used  . Alcohol Use: No  . Drug Use: No  . Sexual Activity: Not on file   Other Topics Concern  . Not on file   Social History Narrative   Has 3 caffeinated bev a day   Current Outpatient Prescriptions on File Prior to Visit  Medication Sig Dispense Refill  . amLODipine (NORVASC) 5 MG tablet TAKE 1 BY MOUTH TWICE DAILY 180 tablet 2  . B Complex-C (SUPER B COMPLEX PO) Take 1 tablet by mouth daily.     . Cholecalciferol (VITAMIN D3) 2000 UNITS TABS Take 2,000 Units by mouth daily.     . diazepam (VALIUM) 5 MG tablet TAKE 1 TABLET AT BEDTIME AS NEEDED. 30 tablet 2  . fexofenadine (ALLEGRA) 180 MG tablet Take 180 mg by mouth at bedtime.     Marland Kitchen FLUoxetine (PROZAC) 10 MG capsule Take 1 capsule (10 mg total) by mouth daily. 90 capsule 3  . furosemide (LASIX) 40 MG tablet TAKE 1/2 TO 1 TABLET DAILY AS NEEDED FOR FLUID 90 tablet 1  . glipiZIDE (GLUCOTROL) 5 MG tablet TAKE 1 & 1/2 TABLETS TWICE DAILY. 90 tablet 0  . glucose blood (ONE TOUCH ULTRA TEST) test strip Use to test blood sugar once daily as instructed. Dx: E11.59 100 each 3  . labetalol (NORMODYNE) 300 MG tablet TAKE 1 BY MOUTH TWICE DAILY (GENERIC FOR NORMODYNE) 180 tablet 2  . meloxicam (MOBIC) 7.5 MG tablet Take 1 tablet (7.5 mg total) by mouth daily. 30 tablet 5  . metFORMIN (GLUCOPHAGE) 500 MG tablet Take 1 tablet (500 mg total) by mouth 2 (two) times daily. 180 tablet 3  . pravastatin (PRAVACHOL) 40 MG tablet TAKE 1 BY MOUTH DAILY  90 tablet 2  . traMADol (ULTRAM) 50 MG tablet Take 1 tablet (50 mg total) by mouth daily as needed (for severe back pain). 30 tablet 0  . valsartan (DIOVAN) 320 MG tablet TAKE 1 BY MOUTH AT BEDTIME 90 tablet 2  . vitamin E 400 UNIT capsule Take 400 Units by mouth daily.     No  current facility-administered medications on file prior to visit.    Allergies  Allergen Reactions  . Amoxicillin     REACTION: rash  . Rocephin [Ceftriaxone Sodium In Dextrose]     10/24 /14 blisters of palms & diffuse rash Because of a history of documented adverse serious drug reaction;Medi Alert bracelet  is recommended  . Captopril     REACTION: cough  . Simvastatin     REACTION: buttocks cramps  . Adhesive [Tape] Rash   Family History  Problem Relation Age of Onset  . Heart failure Mother   . Heart disease Mother   . Hypertension Mother   . Other Mother     varicose veins  . Deep vein thrombosis Mother   . Varicose Veins Mother   . Heart attack Mother   . Hypertension Sister     X 2  . Diabetes Sister   . Hyperlipidemia Sister   . Other Sister     varicose veins  . Heart disease Father   . Diabetes Father   . Hyperlipidemia Father   . Hypertension Father   . Heart attack Father   . Heart disease Brother     MI in late 84s  . Hyperlipidemia Brother   . Heart attack Brother   . Coronary artery disease    . Diabetes    . Parkinsonism Brother   . Heart failure Brother   . Hypertension Daughter    PE: BP 128/64 (BP Location: Right Arm, Patient Position: Sitting, Cuff Size: Large)   Pulse 87   Ht 5\' 5"  (1.651 m)   Wt 167 lb 12.8 oz (76.1 kg)   SpO2 97%   BMI 27.92 kg/m  Body mass index is 27.92 kg/m. Wt Readings from Last 3 Encounters:  10/21/16 167 lb 12.8 oz (76.1 kg)  08/25/16 166 lb (75.3 kg)  08/15/16 165 lb (74.8 kg)   Constitutional: slightly overweight, in NAD Eyes: PERRLA, EOMI, no exophthalmos ENT: moist mucous membranes, no thyromegaly, no cervical  lymphadenopathy Cardiovascular: RRR, No MRG, + mild periankle swelling Respiratory: CTA B Gastrointestinal: abdomen soft, NT, ND, BS+ Musculoskeletal: no deformities, strength intact in all 4 Skin: moist, warm Neurological: no tremor with outstretched hands, DTR normal in all 4  ASSESSMENT: 1. DM2, non-insulin-dependent, controlled, with complications - CKD stage 3  PLAN:  1. Patient with long-standing, uncontrolled diabetes, on oral antidiabetic regimen, prev. On Metformin 500 mg 2x a day, but we had to add Glipizide after she started Prednisone for her FSGS. Sugars started to improve after stopping Prednisone, but they increased due to her depression. They started to improve again after starting Prozac >> will continue current regimen. - May need intensification of tx at last visit (Januvia?). - Reviewed last HbA1c, 6.6% (improved) - I suggested to:  Patient Instructions  Please continue: - Glipizide to 5 mg before b'fast and 10 mg before dinner. - Metformin 500 mg daily 2x a day  Please return in 3 months with your sugar log.   - continue checking sugars at different times of the day - check 2x a day, rotating checks - advised for yearly eye exams >> she is UTD with dilated eye exams - UTD with flu shot - Will need labs at next visit - Return to clinic in 3 mo with sugar log   Philemon Kingdom, MD PhD [facility] Endocrinology

## 2016-10-21 NOTE — Patient Instructions (Addendum)
Please continue: - Glipizide to 5 mg before b'fast and 10 mg before dinner. - Metformin 500 mg daily 2x a day  Please return in 3 months with your sugar log.

## 2016-10-28 ENCOUNTER — Other Ambulatory Visit: Payer: Self-pay | Admitting: Internal Medicine

## 2016-10-28 MED ORDER — DIAZEPAM 5 MG PO TABS: 5.0000 mg | ORAL_TABLET | Freq: Every evening | ORAL | 0 refills | Status: DC | PRN

## 2016-10-28 NOTE — Telephone Encounter (Signed)
Printed script, MD signed, faxed back to [place] [facility]...Johny Chess

## 2016-10-28 NOTE — Telephone Encounter (Signed)
Rc'd fax pt is requesting to have a 90 day on her Diazepam. Is this ok...Sheila Melton

## 2016-10-28 NOTE — Telephone Encounter (Signed)
[place] controlled substance database checked.  Ok to fill medication.  Ok to fill for 90 days.

## 2016-11-07 ENCOUNTER — Encounter (HOSPITAL_COMMUNITY): Payer: Self-pay | Admitting: Emergency Medicine

## 2016-11-07 DIAGNOSIS — Z85828 Personal history of other malignant neoplasm of skin: Secondary | ICD-10-CM | POA: Insufficient documentation

## 2016-11-07 DIAGNOSIS — I129 Hypertensive chronic kidney disease with stage 1 through stage 4 chronic kidney disease, or unspecified chronic kidney disease: Secondary | ICD-10-CM | POA: Insufficient documentation

## 2016-11-07 DIAGNOSIS — R319 Hematuria, unspecified: Secondary | ICD-10-CM | POA: Diagnosis present

## 2016-11-07 DIAGNOSIS — F17210 Nicotine dependence, cigarettes, uncomplicated: Secondary | ICD-10-CM | POA: Insufficient documentation

## 2016-11-07 DIAGNOSIS — Z7984 Long term (current) use of oral hypoglycemic drugs: Secondary | ICD-10-CM | POA: Diagnosis not present

## 2016-11-07 DIAGNOSIS — N189 Chronic kidney disease, unspecified: Secondary | ICD-10-CM | POA: Insufficient documentation

## 2016-11-07 DIAGNOSIS — J449 Chronic obstructive pulmonary disease, unspecified: Secondary | ICD-10-CM | POA: Diagnosis not present

## 2016-11-07 DIAGNOSIS — N3001 Acute cystitis with hematuria: Secondary | ICD-10-CM | POA: Diagnosis not present

## 2016-11-07 DIAGNOSIS — E1122 Type 2 diabetes mellitus with diabetic chronic kidney disease: Secondary | ICD-10-CM | POA: Insufficient documentation

## 2016-11-07 LAB — BASIC METABOLIC PANEL
ANION GAP: 11 (ref 5–15)
BUN: 26 mg/dL — AB (ref 6–20)
CHLORIDE: 102 mmol/L (ref 101–111)
CO2: 27 mmol/L (ref 22–32)
Calcium: 9.9 mg/dL (ref 8.9–10.3)
Creatinine, Ser: 1.34 mg/dL — ABNORMAL HIGH (ref 0.44–1.00)
GFR calc Af Amer: 44 mL/min — ABNORMAL LOW (ref >60)
GFR calc non Af Amer: 38 mL/min — ABNORMAL LOW (ref >60)
GLUCOSE: 110 mg/dL — AB (ref 65–99)
POTASSIUM: 3.3 mmol/L — AB (ref 3.5–5.1)
Sodium: 140 mmol/L (ref 135–145)

## 2016-11-07 LAB — URINALYSIS, ROUTINE W REFLEX MICROSCOPIC
Bilirubin Urine: NEGATIVE
GLUCOSE, UA: NEGATIVE mg/dL
Ketones, ur: NEGATIVE mg/dL
Leukocytes, UA: NEGATIVE
NITRITE: NEGATIVE
PROTEIN: 100 mg/dL — AB
SPECIFIC GRAVITY, URINE: 1.009 (ref 1.005–1.030)
pH: 6 (ref 5.0–8.0)

## 2016-11-07 LAB — CBC WITH DIFFERENTIAL/PLATELET
BASOS ABS: 0.1 10*3/uL (ref 0.0–0.1)
Basophils Relative: 0 %
EOS PCT: 5 %
Eosinophils Absolute: 0.5 10*3/uL (ref 0.0–0.7)
HEMATOCRIT: 35.4 % — AB (ref 36.0–46.0)
Hemoglobin: 11.5 g/dL — ABNORMAL LOW (ref 12.0–15.0)
LYMPHS ABS: 4.4 10*3/uL — AB (ref 0.7–4.0)
LYMPHS PCT: 39 %
MCH: 28 pg (ref 26.0–34.0)
MCHC: 32.5 g/dL (ref 30.0–36.0)
MCV: 86.3 fL (ref 78.0–100.0)
Monocytes Absolute: 1.5 10*3/uL — ABNORMAL HIGH (ref 0.1–1.0)
Monocytes Relative: 13 %
NEUTROS ABS: 4.9 10*3/uL (ref 1.7–7.7)
Neutrophils Relative %: 43 %
PLATELETS: 372 10*3/uL (ref 150–400)
RBC: 4.1 MIL/uL (ref 3.87–5.11)
RDW: 14.4 % (ref 11.5–15.5)
WBC: 11.4 10*3/uL — AB (ref 4.0–10.5)

## 2016-11-07 NOTE — ED Triage Notes (Signed)
Pt complaint of right flank pain, hematuria, and malodorous urine for a week.

## 2016-11-08 ENCOUNTER — Emergency Department (HOSPITAL_COMMUNITY): Payer: Medicare Other

## 2016-11-08 ENCOUNTER — Encounter (HOSPITAL_COMMUNITY): Payer: Self-pay | Admitting: Radiology

## 2016-11-08 ENCOUNTER — Emergency Department (HOSPITAL_COMMUNITY)
Admission: EM | Admit: 2016-11-08 | Discharge: 2016-11-08 | Disposition: A | Discharge status: Home / Self Care | Payer: Medicare Other | Attending: Emergency Medicine | Admitting: Emergency Medicine

## 2016-11-08 DIAGNOSIS — N3001 Acute cystitis with hematuria: Secondary | ICD-10-CM

## 2016-11-08 MED ORDER — CIPROFLOXACIN HCL 500 MG PO TABS
500.0000 mg | ORAL_TABLET | Freq: Once | ORAL | Status: AC
  Administered 2016-11-08: 500 mg via ORAL
  Filled 2016-11-08: qty 1

## 2016-11-08 MED ORDER — IOPAMIDOL (ISOVUE-370) INJECTION 76%
INTRAVENOUS | Status: AC
  Filled 2016-11-08: qty 100

## 2016-11-08 MED ORDER — CIPROFLOXACIN HCL 500 MG PO TABS: 500.0000 mg | ORAL_TABLET | Freq: Two times a day (BID) | ORAL | 0 refills | Status: DC

## 2016-11-08 MED ORDER — IOPAMIDOL (ISOVUE-370) INJECTION 76%
80.0000 mL | Freq: Once | INTRAVENOUS | Status: AC | PRN
  Administered 2016-11-08: 80 mL via INTRAVENOUS

## 2016-11-08 MED ORDER — SODIUM CHLORIDE 0.9 % IV BOLUS (SEPSIS)
1000.0000 mL | Freq: Once | INTRAVENOUS | Status: AC
  Administered 2016-11-08: 1000 mL via INTRAVENOUS

## 2016-11-08 NOTE — ED Provider Notes (Signed)
[facility] DEPT Provider Note   CSN: [id number] Arrival date & time: 11/07/16  1814   By signing my name below, I, Sheila Melton, attest that this documentation has been prepared under the direction and in the presence of Sheila Balls, MD. Electronically signed, Sheila Melton, ED Scribe. 11/08/16. 3:44 AM.   History   Chief Complaint Chief Complaint  Patient presents with  . Flank Pain  . Hematuria   The history is provided by the patient, medical records and the spouse. No language interpreter was used.    HPI Comments: TERYL Melton is a 75 y.o. female who presents to the Emergency Department complaining of flank pain x 1 week. She reports associated hematuria and malodorous urine. Hx of FSGS of the kidneys, abdominal aneurysm and a kidney stone noted. Denies N/V and irregular BM's.  Past Medical History:  Diagnosis Date  . AAA (abdominal aortic aneurysm) (HCC)    BEING WATCHED   VVS   . Arthritis   . CAP (community acquired pneumonia) 10/24-27/14    with SIRS  . Diabetes mellitus    2  . Family history of anesthesia complication    PATIENTS MOM HAD TROUBLE WAKING UP   . Heart murmur   . Hyperlipidemia   . Hypertension    unspecified essential  . Renal insufficiency   . Subclavian steal syndrome    LEFT SIDE   NO SIDE EFFECTS  . UTI (lower urinary tract infection) 08/26/13   Enterococcus and Escherichia coli both sensitive to nitrofurantoin    Patient Active Problem List   Diagnosis Date Noted  . Depression 08/15/2016  . Easy bruising 08/15/2016  . 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 CKD (chronic kidney disease) 05/21/2016  . Sleep difficulties 01/18/2016  . Type 2 diabetes mellitus with circulatory disorder, without long-term current use of insulin ([place]) 10/23/2015  . Numbness of toes-Right foot 06/26/2015  . Proteinuria 03/26/2015  . Colitis 03/19/2015  . Nephrolithiasis 03/19/2015  . AAA (abdominal aortic aneurysm) without rupture  ([place]) 03/19/2015  . HLD (hyperlipidemia) 03/19/2015  . Anxiety state 03/19/2015  . Skin cancer 01/09/2015  . Former smoker 05/24/2014  . CAP (community acquired pneumonia) 09/03/2013  . Bilateral renal cysts 09/16/2012  . 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[place]) 07/26/2012  . Degenerative spondylolisthesis 07/26/2012  . CHRONIC OBSTRUCTIVE PULMONARY DISEASE, ACUTE EXACERBATION 09/04/2010  . DIARRHEA 07/24/2010  . SHOULDER PAIN, LEFT, CHRONIC 07/25/2009  . MUSCLE PAIN 06/11/2009  . CARDIAC MURMUR, SYSTOLIC 41/66/0630  . Mixed hyperlipidemia 12/30/2007  . Tobacco use disorder 12/30/2007  . Essential hypertension 12/30/2007  . Subclavian steal syndrome 12/30/2007  . Palpitations 12/30/2007  . CAROTID BRUIT 04/07/2007    Past Surgical History:  Procedure Laterality Date  . ABDOMINAL HYSTERECTOMY    . CATARACT EXTRACTION     OS  . CYST EXCISION Right 05-23-13   Right index finger: cyst  . DILATION AND CURETTAGE OF UTERUS    . EYE SURGERY    . g1 p1    . SPINE SURGERY  Sept. 2013    OB History    No data available       Home Medications    Prior to Admission medications   Medication Sig Start Date End Date Taking? Authorizing Provider  amLODipine (NORVASC) 5 MG tablet Take 1 tablet (5 mg total) by mouth 2 (two) times daily. Yearly physical due in March must see MD for future refills 10/28/16   Binnie Rail, MD  B Complex-C (SUPER B  COMPLEX PO) Take 1 tablet by mouth daily.     Historical Provider, MD  Blood Glucose Monitoring Suppl (ONE TOUCH ULTRA 2) w/Device KIT Use as advised 10/21/16   Philemon Kingdom, MD  Cholecalciferol (VITAMIN D3) 2000 UNITS TABS Take 2,000 Units by mouth daily.     Historical Provider, MD  diazepam (VALIUM) 5 MG tablet Take 1 tablet (5 mg total) by mouth at bedtime as needed. 10/28/16   Binnie Rail, MD  fexofenadine (ALLEGRA) 180 MG tablet Take 180 mg by mouth at bedtime.     Historical Provider, MD  FLUoxetine (PROZAC) 10 MG  capsule Take 1 capsule (10 mg total) by mouth daily. 08/15/16   Binnie Rail, MD  furosemide (LASIX) 40 MG tablet TAKE 1/2 TO 1 TABLET DAILY AS NEEDED FOR FLUID 08/25/16   Binnie Rail, MD  glipiZIDE (GLUCOTROL) 5 MG tablet TAKE 2 TABLETS IN AM AND 1 TAB IN PM 10/21/16   Philemon Kingdom, MD  glucose blood (ONE TOUCH ULTRA TEST) test strip Use to test blood sugar once daily as instructed. Dx: E11.59 12/31/15   Philemon Kingdom, MD  labetalol (NORMODYNE) 300 MG tablet Take 1 tablet (300 mg total) by mouth 2 (two) times daily. Yearly physical due in March must see MD for future refills 10/28/16   Binnie Rail, MD  meloxicam (MOBIC) 7.5 MG tablet Take 1 tablet (7.5 mg total) by mouth daily. 05/21/16   Binnie Rail, MD  metFORMIN (GLUCOPHAGE) 500 MG tablet Take 1 tablet (500 mg total) by mouth 2 (two) times daily. 04/29/16   Philemon Kingdom, MD  pravastatin (PRAVACHOL) 40 MG tablet Take 1 tablet (40 mg total) by mouth daily. Yearly physical due in March must see MD for refills 10/28/16   Binnie Rail, MD  traMADol (ULTRAM) 50 MG tablet Take 1 tablet (50 mg total) by mouth daily as needed (for severe back pain). 07/08/16   Binnie Rail, MD  valsartan (DIOVAN) 320 MG tablet Take 1 tablet (320 mg total) by mouth at bedtime. Yearly physical due in March must see MD for future refills 10/28/16   Binnie Rail, MD  vitamin E 400 UNIT capsule Take 400 Units by mouth daily.    Historical Provider, MD    Family History Family History  Problem Relation Age of Onset  . Heart failure Mother   . Heart disease Mother   . Hypertension Mother   . Other Mother     varicose veins  . Deep vein thrombosis Mother   . Varicose Veins Mother   . Heart attack Mother   . Hypertension Sister     X 2  . Diabetes Sister   . Hyperlipidemia Sister   . Other Sister     varicose veins  . Heart disease Father   . Diabetes Father   . Hyperlipidemia Father   . Hypertension Father   . Heart attack Father   . Heart  disease Brother     MI in late 63s  . Hyperlipidemia Brother   . Heart attack Brother   . Coronary artery disease    . Diabetes    . Parkinsonism Brother   . Heart failure Brother   . Hypertension Daughter     Social History Social History  Substance Use Topics  . Smoking status: Current Some Day Smoker    Packs/day: 1.00    Years: 50.00    Types: E-cigarettes  . Smokeless tobacco: Never Used  Comment: e-cigs, occasional cigarete  . Alcohol use No     Allergies   Amoxicillin; Rocephin [ceftriaxone sodium in dextrose]; Captopril; Simvastatin; and Adhesive [tape]   Review of Systems Review of Systems  All other systems reviewed and are negative.  A complete 10 system review of systems was obtained and all systems are negative except as noted in the HPI and PMH.    Physical Exam Updated Vital Signs BP 144/85 (BP Location: Left Arm)   Pulse 97   Temp 98.2 F (36.8 C) (Oral)   Resp 18   Wt 167 lb 9.6 oz (76 kg)   SpO2 96%   BMI 27.89 kg/m   Physical Exam  Constitutional: She is oriented to person, place, and time. She appears well-developed and well-nourished. No distress.  HENT:  Head: Normocephalic and atraumatic.  Nose: Nose normal.  Mouth/Throat: Oropharynx is clear and moist. No oropharyngeal exudate.  Eyes: Conjunctivae and EOM are normal. Pupils are equal, round, and reactive to light. No scleral icterus.  Neck: Normal range of motion. Neck supple. No JVD present. No tracheal deviation present. No thyromegaly present.  Cardiovascular: Normal rate, regular rhythm and normal heart sounds.  Exam reveals no gallop and no friction rub.   No murmur heard. Pulmonary/Chest: Effort normal and breath sounds normal. No respiratory distress. She has no wheezes. She exhibits no tenderness.  Abdominal: Soft. Bowel sounds are normal. She exhibits no distension and no mass. There is no tenderness. There is no rebound and no guarding.  Musculoskeletal: Normal range of  motion. She exhibits no edema or tenderness.  Lymphadenopathy:    She has no cervical adenopathy.  Neurological: She is alert and oriented to person, place, and time. No cranial nerve deficit. She exhibits normal muscle tone.  Skin: Skin is warm and dry. No rash noted. No erythema. No pallor.  Nursing note and vitals reviewed.    ED Treatments / Results  DIAGNOSTIC STUDIES: Oxygen Saturation is 96% on RA, adequate by my interpretation.    COORDINATION OF CARE: 3:44 AM Discussed treatment plan with pt at bedside and pt agreed to plan.  Labs (all labs ordered are listed, but only abnormal results are displayed) Labs Reviewed  URINALYSIS, ROUTINE W REFLEX MICROSCOPIC - Abnormal; Notable for the following:       Result Value   Hgb urine dipstick LARGE (*)    Protein, ur 100 (*)    Bacteria, UA RARE (*)    Squamous Epithelial / LPF 0-5 (*)    All other components within normal limits  CBC WITH DIFFERENTIAL/PLATELET - Abnormal; Notable for the following:    WBC 11.4 (*)    Hemoglobin 11.5 (*)    HCT 35.4 (*)    Lymphs Abs 4.4 (*)    Monocytes Absolute 1.5 (*)    All other components within normal limits  BASIC METABOLIC PANEL - Abnormal; Notable for the following:    Potassium 3.3 (*)    Glucose, Bld 110 (*)    BUN 26 (*)    Creatinine, Ser 1.34 (*)    GFR calc non Af Amer 38 (*)    GFR calc Af Amer 44 (*)    All other components within normal limits  URINE CULTURE    EKG  EKG Interpretation None       Radiology Ct Angio Abd/pel W And/or Wo Contrast  Result Date: 11/08/2016 CLINICAL DATA:  Right flank pain, hematuria. Known kidney stone and abdominal aortic aneurysm. EXAM: CTA ABDOMEN AND PELVIS  WITHOUT AND WITH CONTRAST TECHNIQUE: Multidetector CT imaging of the abdomen and pelvis was performed using the standard protocol during bolus administration of intravenous contrast. Multiplanar reconstructed images and MIPs were obtained and reviewed to evaluate the vascular  anatomy. CONTRAST:  80 cc of Isovue 370.  The GFR was 38. COMPARISON:  CT abdomen from 03/19/2015, CXR 09/19/2013 FINDINGS: VASCULAR Aorta: 4.2 cm infrarenal abdominal aortic aneurysm without rupture. Soft plaque noted along the anterior and lateral walls. Aneurysm terminates at the bifurcation. Celiac: Small vascular web versus mild stenosis just distal to the origin of the celiac, series 4, image 44. The origin is not visualized and stenosis is not entirely excluded. SMA: Mild atherosclerosis at the origin of the SMA. SMA is patent without significant stenosis. Renals: Single renal arteries to both kidneys without significant stenosis. No evidence of aneurysm or fibromuscular dysplasia. IMA: Patent without evidence of aneurysm, dissection, vasculitis or significant stenosis. Inflow: There is atherosclerosis of both common iliac arteries without aneurysm. Both common iliac arteries measuring 3 mm in caliber. Proximal Outflow: Right common femoral artery 7 mm in diameter and the left 5 mm with calcified plaque noted. Veins: No obvious venous abnormality within the limitations of this arterial phase study. Review of the MIP images confirms the above findings. NON-VASCULAR Lower chest: The heart is normal in size. There is coronary arteriosclerosis. Right middle lobe 13 x 10 mm nodule is noted not apparent on prior comparison CT nor visualized on prior chest radiograph from 09/19/2013. Hepatobiliary: No space-occupying mass of the liver. Contracted gallbladder without stones. No biliary dilatation. Pancreas: No pancreatic mass.  Pancreatic duct is normal in caliber. Spleen: No splenomegaly. Adrenals/Urinary Tract: Normal adrenal glands. Nonobstructing lower pole left renal calculus measuring 5 mm. Small extrarenal pelves are noted bilaterally with a simple cyst cyst in the lower pole the right kidney measuring 4.4 x 4.8 cm. Smaller upper pole cysts are noted too small to further characterize. Stomach/Bowel: Stomach is  within normal limits. Appendix appears normal. No evidence of bowel wall thickening, distention, or inflammatory changes. Lymphatic: No enlarged abdominal or pelvic lymph nodes. Reproductive: Hysterectomy. Other: Small fat containing umbilical hernia. No ascites or free air. Musculoskeletal: Lumbar disc fusion from L3 through S1 with degenerative disc disease at L2-3 and L5-S1. Low lung volume No acute osseous abnormality. IMPRESSION: VASCULAR Stable 4.2 cm infrarenal abdominal aortic aneurysm. Possible tiny vascular lab in the proximal celiac artery. The origin of the right celiac artery is not identified and soft plaque is not excluded versus focal stenosis. NON-VASCULAR 13 x 10 mm right middle lobe pulmonary nodule. Consider one of the following in 3 months for both low-risk and high-risk individuals: (a) repeat chest CT, (b) follow-up PET-CT, or (c) tissue sampling. This recommendation follows the consensus statement: Guidelines for Management of Incidental Pulmonary Nodules Detected on CT Images: From the Fleischner Society 2017; Radiology 2017; 284:228-243. Nonobstructing 5 mm left renal stone with bilateral renal cysts. No obstructive uropathy. L3 through L5 lumbar disc fusion. Degenerative disc disease above and below the fusion. Electronically Signed   By: Ashley Royalty M.D.   On: 11/08/2016 03:26    Procedures Procedures (including critical care time)  Medications Ordered in ED Medications  iopamidol (ISOVUE-370) 76 % injection (not administered)  sodium chloride 0.9 % bolus 1,000 mL (1,000 mLs Intravenous New Bag/Given 11/08/16 0210)  iopamidol (ISOVUE-370) 76 % injection 80 mL (80 mLs Intravenous Contrast Given 11/08/16 0231)     Initial Impression / Assessment and Plan / ED Course  I have reviewed the triage vital signs and the nursing notes.  Pertinent labs & imaging results that were available during my care of the patient were reviewed by me and considered in my medical decision making  (see chart for details).  Clinical Course    Patient presents to the ED for R flank pain and hematuria.  She has h/o of AAA that is infrarenal.  This could also be a kidney stone, but she is not having the pain I would expect to be associated with it.  Will obtain CTA and also look for hydro.  She is currently not requesting pain medications.   CTA neg for rupture or hydro or stone.  She still complains of foul smelling urine and is concerned for infection.  Will treat antibiotics but advised to follow up with urology for painless hematuria.  Patient could have cancer of the urinary track.  She demonstrates good understanding of the plan.  She appears well and in NAD. Vs remain within her normal limits and she is safe for DC.   Final Clinical Impressions(s) / ED Diagnoses   Final diagnoses:  None    New Prescriptions New Prescriptions   No medications on file    I personally performed the services described in this documentation, which was scribed in my presence. The recorded information has been reviewed and is accurate.      Sheila Balls, MD 11/08/16 [phone]

## 2016-11-09 LAB — URINE CULTURE: Culture: <10000 — AB

## 2016-11-18 DIAGNOSIS — R310 Gross hematuria: Secondary | ICD-10-CM | POA: Diagnosis not present

## 2016-11-18 DIAGNOSIS — N182 Chronic kidney disease, stage 2 (mild): Secondary | ICD-10-CM | POA: Diagnosis not present

## 2016-11-18 DIAGNOSIS — D4101 Neoplasm of uncertain behavior of right kidney: Secondary | ICD-10-CM | POA: Diagnosis not present

## 2016-11-18 DIAGNOSIS — N202 Calculus of kidney with calculus of ureter: Secondary | ICD-10-CM | POA: Diagnosis not present

## 2016-11-19 ENCOUNTER — Other Ambulatory Visit: Payer: Self-pay | Admitting: Urology

## 2016-11-19 ENCOUNTER — Encounter: Payer: Self-pay | Admitting: Internal Medicine

## 2016-11-19 ENCOUNTER — Ambulatory Visit (INDEPENDENT_AMBULATORY_CARE_PROVIDER_SITE_OTHER): Payer: PPO | Admitting: Internal Medicine

## 2016-11-19 VITALS — BP 142/86 | HR 82 | Temp 98.4°F | Resp 16 | Wt 164.0 lb

## 2016-11-19 DIAGNOSIS — E2839 Other primary ovarian failure: Secondary | ICD-10-CM

## 2016-11-19 DIAGNOSIS — F411 Generalized anxiety disorder: Secondary | ICD-10-CM

## 2016-11-19 DIAGNOSIS — Z13820 Encounter for screening for osteoporosis: Secondary | ICD-10-CM

## 2016-11-19 DIAGNOSIS — Z0000 Encounter for general adult medical examination without abnormal findings: Secondary | ICD-10-CM

## 2016-11-19 DIAGNOSIS — I10 Essential (primary) hypertension: Secondary | ICD-10-CM

## 2016-11-19 DIAGNOSIS — F32A Depression, unspecified: Secondary | ICD-10-CM

## 2016-11-19 DIAGNOSIS — N189 Chronic kidney disease, unspecified: Secondary | ICD-10-CM

## 2016-11-19 DIAGNOSIS — F329 Major depressive disorder, single episode, unspecified: Secondary | ICD-10-CM

## 2016-11-19 DIAGNOSIS — D4101 Neoplasm of uncertain behavior of right kidney: Secondary | ICD-10-CM

## 2016-11-19 DIAGNOSIS — G9519 Other vascular myelopathies: Secondary | ICD-10-CM

## 2016-11-19 DIAGNOSIS — E1159 Type 2 diabetes mellitus with other circulatory complications: Secondary | ICD-10-CM | POA: Diagnosis not present

## 2016-11-19 DIAGNOSIS — M4807 Spinal stenosis, lumbosacral region: Secondary | ICD-10-CM

## 2016-11-19 DIAGNOSIS — E782 Mixed hyperlipidemia: Secondary | ICD-10-CM

## 2016-11-19 DIAGNOSIS — G479 Sleep disorder, unspecified: Secondary | ICD-10-CM

## 2016-11-19 MED ORDER — TRAMADOL HCL 50 MG PO TABS: 50.0000 mg | ORAL_TABLET | Freq: Two times a day (BID) | ORAL | 0 refills | Status: DC | PRN

## 2016-11-19 MED ORDER — TRAMADOL HCL 50 MG PO TABS: 50.0000 mg | ORAL_TABLET | Freq: Two times a day (BID) | ORAL | 5 refills | Status: DC | PRN

## 2016-11-19 NOTE — Progress Notes (Signed)
Subjective:    Patient ID: Sheila Melton, female    DOB: December 16, 1940, 76 y.o.   MRN: [id number]  HPI She is here for a physical exam.   She saw urology yesterday.  There is still blood in her urine.  She does not have an infection. Her urine has an odor, but not consistently.  She will have an MRI on 12/03/16.   She does have history of kidney stones.    She takes tramadol as needed for joint/back pain.  She was advised to get off the meloxicam and has needed to take tramadol 1-2 daily.    Medications and allergies reviewed with patient and updated if appropriate.  Patient Active Problem List   Diagnosis Date Noted  . Depression 08/15/2016  . Easy bruising 08/15/2016  . 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 CKD (chronic kidney disease) 05/21/2016  . Sleep difficulties 01/18/2016  . Type 2 diabetes mellitus with circulatory disorder, without long-term current use of insulin ([place]) 10/23/2015  . Numbness of toes-Right foot 06/26/2015  . Proteinuria 03/26/2015  . Colitis 03/19/2015  . Nephrolithiasis 03/19/2015  . AAA (abdominal aortic aneurysm) without rupture ([place]) 03/19/2015  . HLD (hyperlipidemia) 03/19/2015  . Anxiety state 03/19/2015  . Skin cancer 01/09/2015  . Former smoker 05/24/2014  . CAP (community acquired pneumonia) 09/03/2013  . Bilateral renal cysts 09/16/2012  . 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[place]) 07/26/2012  . Degenerative spondylolisthesis 07/26/2012  . CHRONIC OBSTRUCTIVE PULMONARY DISEASE, ACUTE EXACERBATION 09/04/2010  . DIARRHEA 07/24/2010  . SHOULDER PAIN, LEFT, CHRONIC 07/25/2009  . MUSCLE PAIN 06/11/2009  . CARDIAC MURMUR, SYSTOLIC 01/65/5374  . Mixed hyperlipidemia 12/30/2007  . Tobacco use disorder 12/30/2007  . Essential hypertension 12/30/2007  . Subclavian steal syndrome 12/30/2007  . Palpitations 12/30/2007  . CAROTID BRUIT 04/07/2007    Current Outpatient Prescriptions on File Prior to Visit    Medication Sig Dispense Refill  . amLODipine (NORVASC) 5 MG tablet Take 1 tablet (5 mg total) by mouth 2 (two) times daily. Yearly physical due in March must see MD for future refills 180 tablet 0  . B Complex-C (SUPER B COMPLEX PO) Take 1 tablet by mouth daily.     . Blood Glucose Monitoring Suppl (ONE TOUCH ULTRA 2) w/Device KIT Use as advised 1 each 0  . Cholecalciferol (VITAMIN D3) 2000 UNITS TABS Take 2,000 Units by mouth daily.     . diazepam (VALIUM) 5 MG tablet Take 1 tablet (5 mg total) by mouth at bedtime as needed. 90 tablet 0  . fexofenadine (ALLEGRA) 180 MG tablet Take 180 mg by mouth at bedtime.     Marland Kitchen FLUoxetine (PROZAC) 10 MG capsule Take 1 capsule (10 mg total) by mouth daily. 90 capsule 3  . furosemide (LASIX) 40 MG tablet TAKE 1/2 TO 1 TABLET DAILY AS NEEDED FOR FLUID 90 tablet 1  . glipiZIDE (GLUCOTROL) 5 MG tablet TAKE 2 TABLETS IN AM AND 1 TAB IN PM 270 tablet 3  . glucose blood (ONE TOUCH ULTRA TEST) test strip Use to test blood sugar once daily as instructed. Dx: E11.59 100 each 3  . labetalol (NORMODYNE) 300 MG tablet Take 1 tablet (300 mg total) by mouth 2 (two) times daily. Yearly physical due in March must see MD for future refills 180 tablet 0  . meloxicam (MOBIC) 7.5 MG tablet Take 1 tablet (7.5 mg total) by mouth daily. 30 tablet 5  . metFORMIN (GLUCOPHAGE) 500 MG tablet Take 1 tablet (500  mg total) by mouth 2 (two) times daily. 180 tablet 3  . pravastatin (PRAVACHOL) 40 MG tablet Take 1 tablet (40 mg total) by mouth daily. Yearly physical due in March must see MD for refills 90 tablet 0  . traMADol (ULTRAM) 50 MG tablet Take 1 tablet (50 mg total) by mouth daily as needed (for severe back pain). 30 tablet 0  . valsartan (DIOVAN) 320 MG tablet Take 1 tablet (320 mg total) by mouth at bedtime. Yearly physical due in March must see MD for future refills 90 tablet 0  . vitamin E 400 UNIT capsule Take 400 Units by mouth daily.     No current facility-administered  medications on file prior to visit.     Past Medical History:  Diagnosis Date  . AAA (abdominal aortic aneurysm) (HCC)    BEING WATCHED   VVS   . Arthritis   . CAP (community acquired pneumonia) 10/24-27/14    with SIRS  . Diabetes mellitus    2  . Family history of anesthesia complication    PATIENTS MOM HAD TROUBLE WAKING UP   . Heart murmur   . Hyperlipidemia   . Hypertension    unspecified essential  . Renal insufficiency   . Subclavian steal syndrome    LEFT SIDE   NO SIDE EFFECTS  . UTI (lower urinary tract infection) 08/26/13   Enterococcus and Escherichia coli both sensitive to nitrofurantoin    Past Surgical History:  Procedure Laterality Date  . ABDOMINAL HYSTERECTOMY    . CATARACT EXTRACTION     OS  . CYST EXCISION Right 05-23-13   Right index finger: cyst  . DILATION AND CURETTAGE OF UTERUS    . EYE SURGERY    . g1 p1    . SPINE SURGERY  Sept. 2013    Social History   Social History  . Marital status: Married    Spouse name: N/A  . Number of children: 1  . Years of education: N/A   Social History Main Topics  . Smoking status: Current Some Day Smoker    Packs/day: 1.00    Years: 50.00    Types: E-cigarettes  . Smokeless tobacco: Never Used     Comment: e-cigs, occasional cigarete  . Alcohol use No  . Drug use: No  . Sexual activity: Not on file   Other Topics Concern  . Not on file   Social History Narrative   Has 3 caffeinated bev a day    Family History  Problem Relation Age of Onset  . Heart failure Mother   . Heart disease Mother   . Hypertension Mother   . Other Mother     varicose veins  . Deep vein thrombosis Mother   . Varicose Veins Mother   . Heart attack Mother   . Hypertension Sister     X 2  . Diabetes Sister   . Hyperlipidemia Sister   . Other Sister     varicose veins  . Heart disease Father   . Diabetes Father   . Hyperlipidemia Father   . Hypertension Father   . Heart attack Father   . Heart disease  Brother     MI in late 66s  . Hyperlipidemia Brother   . Heart attack Brother   . Coronary artery disease    . Diabetes    . Parkinsonism Brother   . Heart failure Brother   . Hypertension Daughter     Review of Systems  Constitutional:  Positive for fatigue (low energy). Negative for appetite change, chills and fever.  Eyes: Negative for visual disturbance.  Respiratory: Negative for cough, shortness of breath and wheezing.   Cardiovascular: Positive for leg swelling (occ, mild). Negative for chest pain and palpitations.  Gastrointestinal: Negative for abdominal pain, blood in stool, constipation, diarrhea and nausea.       GERD rare  Genitourinary: Positive for hematuria (none today - has seen urology). Negative for dysuria.  Musculoskeletal: Positive for arthralgias and back pain.  Skin: Positive for color change (bruises that go away from meloxicam). Negative for rash.  Neurological: Negative for dizziness, light-headedness and headaches.  Psychiatric/Behavioral: Positive for dysphoric mood and sleep disturbance. Suicidal ideas: controlled. The patient is nervous/anxious.        Objective:   Vitals:   11/19/16 1414  BP: (!) 142/86  Pulse: 82  Resp: 16  Temp: 98.4 F (36.9 C)   Filed Weights   11/19/16 1414  Weight: 164 lb (74.4 kg)   Body mass index is 27.29 kg/m.  Wt Readings from Last 3 Encounters:  11/19/16 164 lb (74.4 kg)  11/07/16 167 lb 9.6 oz (76 kg)  10/21/16 167 lb 12.8 oz (76.1 kg)     Physical Exam Constitutional: She appears well-developed and well-nourished. No distress.  HENT:  Head: Normocephalic and atraumatic.  Right Ear: External ear normal. Normal ear canal and TM Left Ear: External ear normal.  Normal ear canal and TM Mouth/Throat: Oropharynx is clear and moist.  Eyes: Conjunctivae and EOM are normal.  Neck: Neck supple. No tracheal deviation present. No thyromegaly present.  No carotid bruit  Cardiovascular: Normal rate, regular  rhythm and normal heart sounds.   2/6 systolic murmur heard.  No edema. Pulmonary/Chest: Effort normal and breath sounds normal. No respiratory distress. She has no wheezes. She has no rales.  Breast: deferred to Gyn Abdominal: Soft. She exhibits no distension. There is no tenderness.  Lymphadenopathy: She has no cervical adenopathy.  Skin: Skin is warm and dry. She is not diaphoretic.  Psychiatric: She has a normal mood and affect. Her behavior is normal.         Assessment & Plan:    Physical exam: Screening blood work ordered Immunizations discussed shingles vaccine Colonoscopy   Up to date  Mammogram -- due - will schedule Gyn  - no longer sees a gyn Dexa  - due - will order Eye exams  Up to date  Exercise - stressed regular exercise - can do silver sneakers with new insurance and will start Weight - advised weight loss Skin  - sees derm annually, has a benign mole on chest Substance abuse  - advised cessation of e-cig  See Problem List for Assessment and Plan of chronic medical problems.   FU in 6 months

## 2016-11-19 NOTE — Patient Instructions (Addendum)
Test(s) ordered today. Your results will be released to [place] (or called to you) after review, usually within 72hours after test completion. If any changes need to be made, you will be notified at that same time.  All other Health Maintenance issues reviewed.   All recommended immunizations and age-appropriate screenings are up-to-date or discussed.  No immunizations administered today.   Medications reviewed and updated.  No changes recommended at this time.  Your prescription(s) have been submitted to your pharmacy. Please take as directed and contact our office if you believe you are having problem(s) with the medication(s).  A bone density scan was ordered  Please followup in 6 months for a wellness visit for a nurse and follow up with me   Health Maintenance, Female Introduction Adopting a healthy lifestyle and getting preventive care can go a long way to promote health and wellness. Talk with your health care provider about what schedule of regular examinations is right for you. This is a good chance for you to check in with your provider about disease prevention and staying healthy. In between checkups, there are plenty of things you can do on your own. Experts have done a lot of research about which lifestyle changes and preventive measures are most likely to keep you healthy. Ask your health care provider for more information. Weight and diet Eat a healthy diet  Be sure to include plenty of vegetables, fruits, low-fat dairy products, and lean protein.  Do not eat a lot of foods high in solid fats, added sugars, or salt.  Get regular exercise. This is one of the most important things you can do for your health.  Most adults should exercise for at least 150 minutes each week. The exercise should increase your heart rate and make you sweat (moderate-intensity exercise).  Most adults should also do strengthening exercises at least twice a week. This is in addition to the  moderate-intensity exercise. Maintain a healthy weight  Body mass index (BMI) is a measurement that can be used to identify possible weight problems. It estimates body fat based on height and weight. Your health care provider can help determine your BMI and help you achieve or maintain a healthy weight.  For females 56 years of age and older:  A BMI below 18.5 is considered underweight.  A BMI of 18.5 to 24.9 is normal.  A BMI of 25 to 29.9 is considered overweight.  A BMI of 30 and above is considered obese. Watch levels of cholesterol and blood lipids  You should start having your blood tested for lipids and cholesterol at 74 years of age, then have this test every 5 years.  You may need to have your cholesterol levels checked more often if:  Your lipid or cholesterol levels are high.  You are older than 74 years of age.  You are at high risk for heart disease. Cancer screening Lung Cancer  Lung cancer screening is recommended for adults 34-38 years old who are at high risk for lung cancer because of a history of smoking.  A yearly low-dose CT scan of the lungs is recommended for people who:  Currently smoke.  Have quit within the past 15 years.  Have at least a 30-pack-year history of smoking. A pack year is smoking an average of one pack of cigarettes a day for 1 year.  Yearly screening should continue until it has been 15 years since you quit.  Yearly screening should stop if you develop a health problem  that would prevent you from having lung cancer treatment. Breast Cancer  Practice breast self-awareness. This means understanding how your breasts normally appear and feel.  It also means doing regular breast self-exams. Let your health care provider know about any changes, no matter how small.  If you are in your 20s or 30s, you should have a clinical breast exam (CBE) by a health care provider every 1-3 years as part of a regular health exam.  If you are 45 or  older, have a CBE every year. Also consider having a breast X-ray (mammogram) every year.  If you have a family history of breast cancer, talk to your health care provider about genetic screening.  If you are at high risk for breast cancer, talk to your health care provider about having an MRI and a mammogram every year.  Breast cancer gene (BRCA) assessment is recommended for women who have family members with BRCA-related cancers. BRCA-related cancers include:  Breast.  Ovarian.  Tubal.  Peritoneal cancers.  Results of the assessment will determine the need for genetic counseling and BRCA1 and BRCA2 testing. Cervical Cancer  Your health care provider may recommend that you be screened regularly for cancer of the pelvic organs (ovaries, uterus, and vagina). This screening involves a pelvic examination, including checking for microscopic changes to the surface of your cervix (Pap test). You may be encouraged to have this screening done every 3 years, beginning at age 74.  For women ages 20-65, health care providers may recommend pelvic exams and Pap testing every 3 years, or they may recommend the Pap and pelvic exam, combined with testing for human papilloma virus (HPV), every 5 years. Some types of HPV increase your risk of cervical cancer. Testing for HPV may also be done on women of any age with unclear Pap test results.  Other health care providers may not recommend any screening for nonpregnant women who are considered low risk for pelvic cancer and who do not have symptoms. Ask your health care provider if a screening pelvic exam is right for you.  If you have had past treatment for cervical cancer or a condition that could lead to cancer, you need Pap tests and screening for cancer for at least 20 years after your treatment. If Pap tests have been discontinued, your risk factors (such as having a new sexual partner) need to be reassessed to determine if screening should resume. Some  women have medical problems that increase the chance of getting cervical cancer. In these cases, your health care provider may recommend more frequent screening and Pap tests. Colorectal Cancer  This type of cancer can be detected and often prevented.  Routine colorectal cancer screening usually begins at 74 years of age and continues through 74 years of age.  Your health care provider may recommend screening at an earlier age if you have risk factors for colon cancer.  Your health care provider may also recommend using home test kits to check for hidden blood in the stool.  A small camera at the end of a tube can be used to examine your colon directly (sigmoidoscopy or colonoscopy). This is done to check for the earliest forms of colorectal cancer.  Routine screening usually begins at age 84.  Direct examination of the colon should be repeated every 5-10 years through 74 years of age. However, you may need to be screened more often if early forms of precancerous polyps or small growths are found. Skin Cancer  Check your skin  from head to toe regularly.  Tell your health care provider about any new moles or changes in moles, especially if there is a change in a mole's shape or color.  Also tell your health care provider if you have a mole that is larger than the size of a pencil eraser.  Always use sunscreen. Apply sunscreen liberally and repeatedly throughout the day.  Protect yourself by wearing long sleeves, pants, a wide-brimmed hat, and sunglasses whenever you are outside. Heart disease, diabetes, and high blood pressure  High blood pressure causes heart disease and increases the risk of stroke. High blood pressure is more likely to develop in:  People who have blood pressure in the high end of the normal range (130-139/85-89 mm Hg).  People who are overweight or obese.  People who are African American.  If you are 41-58 years of age, have your blood pressure checked every  3-5 years. If you are 60 years of age or older, have your blood pressure checked every year. You should have your blood pressure measured twice-once when you are at a hospital or clinic, and once when you are not at a hospital or clinic. Record the average of the two measurements. To check your blood pressure when you are not at a hospital or clinic, you can use:  An automated blood pressure machine at a pharmacy.  A home blood pressure monitor.  If you are between 47 years and 57 years old, ask your health care provider if you should take aspirin to prevent strokes.  Have regular diabetes screenings. This involves taking a blood sample to check your fasting blood sugar level.  If you are at a normal weight and have a low risk for diabetes, have this test once every three years after 74 years of age.  If you are overweight and have a high risk for diabetes, consider being tested at a younger age or more often. Preventing infection Hepatitis B  If you have a higher risk for hepatitis B, you should be screened for this virus. You are considered at high risk for hepatitis B if:  You were born in a country where hepatitis B is common. Ask your health care provider which countries are considered high risk.  Your parents were born in a high-risk country, and you have not been immunized against hepatitis B (hepatitis B vaccine).  You have HIV or AIDS.  You use needles to inject street drugs.  You live with someone who has hepatitis B.  You have had sex with someone who has hepatitis B.  You get hemodialysis treatment.  You take certain medicines for conditions, including cancer, organ transplantation, and autoimmune conditions. Hepatitis C  Blood testing is recommended for:  Everyone born from [birth year] through 1963.  Anyone with known risk factors for hepatitis C. Sexually transmitted infections (STIs)  You should be screened for sexually transmitted infections (STIs) including  gonorrhea and chlamydia if:  You are sexually active and are younger than 74 years of age.  You are older than 75 years of age and your health care provider tells you that you are at risk for this type of infection.  Your sexual activity has changed since you were last screened and you are at an increased risk for chlamydia or gonorrhea. Ask your health care provider if you are at risk.  If you do not have HIV, but are at risk, it may be recommended that you take a prescription medicine daily to prevent HIV infection.  This is called pre-exposure prophylaxis (PrEP). You are considered at risk if:  You are sexually active and do not regularly use condoms or know the HIV status of your partner(s).  You take drugs by injection.  You are sexually active with a partner who has HIV. Talk with your health care provider about whether you are at high risk of being infected with HIV. If you choose to begin PrEP, you should first be tested for HIV. You should then be tested every 3 months for as long as you are taking PrEP. Pregnancy  If you are premenopausal and you may become pregnant, ask your health care provider about preconception counseling.  If you may become pregnant, take 400 to 800 micrograms (mcg) of folic acid every day.  If you want to prevent pregnancy, talk to your health care provider about birth control (contraception). Osteoporosis and menopause  Osteoporosis is a disease in which the bones lose minerals and strength with aging. This can result in serious bone fractures. Your risk for osteoporosis can be identified using a bone density scan.  If you are 69 years of age or older, or if you are at risk for osteoporosis and fractures, ask your health care provider if you should be screened.  Ask your health care provider whether you should take a calcium or vitamin D supplement to lower your risk for osteoporosis.  Menopause may have certain physical symptoms and risks.  Hormone  replacement therapy may reduce some of these symptoms and risks. Talk to your health care provider about whether hormone replacement therapy is right for you. Follow these instructions at home:  Schedule regular health, dental, and eye exams.  Stay current with your immunizations.  Do not use any tobacco products including cigarettes, chewing tobacco, or electronic cigarettes.  If you are pregnant, do not drink alcohol.  If you are breastfeeding, limit how much and how often you drink alcohol.  Limit alcohol intake to no more than 1 drink per day for nonpregnant women. One drink equals 12 ounces of beer, 5 ounces of wine, or 1 ounces of hard liquor.  Do not use street drugs.  Do not share needles.  Ask your health care provider for help if you need support or information about quitting drugs.  Tell your health care provider if you often feel depressed.  Tell your health care provider if you have ever been abused or do not feel safe at home. This information is not intended to replace advice given to you by your health care provider. Make sure you discuss any questions you have with your health care provider. Document Released: 05/12/2011 Document Revised: 04/03/2016 Document Reviewed: 07/31/2015  2017 Elsevier

## 2016-11-19 NOTE — Assessment & Plan Note (Signed)
Controlled, stable Continue current dose of medication  

## 2016-11-19 NOTE — Assessment & Plan Note (Signed)
Following with nephrology - [facility]

## 2016-11-19 NOTE — Progress Notes (Signed)
Pre visit review using our clinic review tool, if applicable. No additional management support is needed unless otherwise documented below in the visit note. 

## 2016-11-19 NOTE — Assessment & Plan Note (Signed)
Management per Dr Cruzita Lederer Sugars controlled at home

## 2016-11-19 NOTE — Assessment & Plan Note (Signed)
BP Readings from Last 3 Encounters:  11/19/16 (!) 142/86  11/08/16 155/70  10/21/16 128/64   Slightly elevated here today Start regular exercise, work on weight loss Will hold off on changes today

## 2016-11-19 NOTE — Assessment & Plan Note (Signed)
Controlled, stable Continue current dose of medication - valium daily

## 2016-11-19 NOTE — Assessment & Plan Note (Signed)
Check lipid panel  Continue daily statin Regular exercise and healthy diet encouraged  

## 2016-11-19 NOTE — Assessment & Plan Note (Signed)
Will stop meloxicam due to CKD Tramadol as needed - will need 1-2 tabs daily  Refilled today with refills F/u in 6 months

## 2016-11-30 ENCOUNTER — Other Ambulatory Visit: Payer: Self-pay | Admitting: Internal Medicine

## 2016-12-01 NOTE — Telephone Encounter (Signed)
Please advise, not on current med list.  

## 2016-12-03 ENCOUNTER — Ambulatory Visit
Admission: RE | Admit: 2016-12-03 | Discharge: 2016-12-03 | Disposition: A | Discharge status: Home / Self Care | Payer: PPO | Source: Ambulatory Visit | Attending: Urology | Admitting: Urology

## 2016-12-03 DIAGNOSIS — N281 Cyst of kidney, acquired: Secondary | ICD-10-CM | POA: Diagnosis not present

## 2016-12-03 DIAGNOSIS — D4101 Neoplasm of uncertain behavior of right kidney: Secondary | ICD-10-CM

## 2016-12-03 MED ORDER — GADOBENATE DIMEGLUMINE 529 MG/ML IV SOLN
7.0000 mL | Freq: Once | INTRAVENOUS | Status: AC | PRN
  Administered 2016-12-03: 7 mL via INTRAVENOUS

## 2016-12-09 NOTE — Progress Notes (Signed)
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 Patient has an abdominal aortic aneurysm followed by vascular surgery. Echocardiogram July 2009 showed normal left ventricular function and mild mitral regurgitation. Carotid Dopplers April 2017 showed 1-39% bilateral stenosis. 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 Seen by Dr Fletcher Anon and medical therapy recommended. CTA December 2017 showed 4.2 cm abdominal aortic aneurysm. There is a right middle lobe nodule and further workup recommended. MRA January 2018 showed 4.2 cm abdominal aortic aneurysm. Since last seen, the patient has dyspnea with more extreme activities but not with routine activities. It is relieved with rest. It is not associated with chest pain. There is no orthopnea, PND or pedal edema. There is no syncope or palpitations. There is no exertional chest pain. She is not having pain in her left upper extremity with use. There is no dizziness.   Current Outpatient Prescriptions  Medication Sig Dispense Refill  . amLODipine (NORVASC) 5 MG tablet Take 1 tablet (5 mg total) by mouth 2 (two) times daily. Yearly physical due in March must see MD for future refills 180 tablet 0  . B Complex-C (SUPER B COMPLEX PO) Take 1 tablet by mouth daily.     . Blood Glucose Monitoring Suppl (ONE TOUCH ULTRA 2) w/Device KIT Use as advised 1 each 0  . Cholecalciferol (VITAMIN D3) 2000 UNITS TABS Take 2,000 Units by mouth daily.     . diazepam (VALIUM) 5 MG tablet Take 1 tablet (5 mg total) by mouth at bedtime as needed. 90 tablet 0  . fexofenadine (ALLEGRA) 180 MG tablet Take 180 mg by mouth at bedtime.     Marland Kitchen FLUoxetine (PROZAC) 10 MG capsule Take 1 capsule (10 mg total) by mouth daily. 90 capsule 3  . furosemide (LASIX) 40 MG tablet TAKE 1/2 TO 1 TABLET DAILY AS NEEDED FOR FLUID 90 tablet 1  . glipiZIDE (GLUCOTROL) 5 MG tablet TAKE 2 TABLETS IN AM AND 1 TAB IN PM 270 tablet 3  . glucose blood (ONE TOUCH ULTRA TEST) test strip Use to test blood  sugar once daily as instructed. Dx: E11.59 100 each 3  . labetalol (NORMODYNE) 300 MG tablet Take 1 tablet (300 mg total) by mouth 2 (two) times daily. Yearly physical due in March must see MD for future refills 180 tablet 0  . metFORMIN (GLUCOPHAGE) 500 MG tablet Take 1 tablet (500 mg total) by mouth 2 (two) times daily. 180 tablet 3  . pravastatin (PRAVACHOL) 40 MG tablet Take 1 tablet (40 mg total) by mouth daily. Yearly physical due in March must see MD for refills 90 tablet 0  . traMADol (ULTRAM) 50 MG tablet Take 1 tablet (50 mg total) by mouth every 12 (twelve) hours as needed (for severe back pain). 60 tablet 0  . valsartan (DIOVAN) 320 MG tablet Take 1 tablet (320 mg total) by mouth at bedtime. Yearly physical due in March must see MD for future refills 90 tablet 0  . vitamin E 400 UNIT capsule Take 400 Units by mouth daily.     No current facility-administered medications for this visit.      Past Medical History:  Diagnosis Date  . AAA (abdominal aortic aneurysm) (HCC)    BEING WATCHED   [facility]   . Arthritis   . CAP (community acquired pneumonia) 10/24-27/14    with SIRS  . Diabetes mellitus    2  . Family history of anesthesia complication    PATIENTS MOM HAD TROUBLE WAKING  UP   . Heart murmur   . Hyperlipidemia   . Hypertension    unspecified essential  . Renal insufficiency   . Subclavian steal syndrome    LEFT SIDE   NO SIDE EFFECTS  . UTI (lower urinary tract infection) 08/26/13   Enterococcus and Escherichia coli both sensitive to nitrofurantoin    Past Surgical History:  Procedure Laterality Date  . ABDOMINAL HYSTERECTOMY    . CATARACT EXTRACTION     OS  . CYST EXCISION Right 05-23-13   Right index finger: cyst  . DILATION AND CURETTAGE OF UTERUS    . EYE SURGERY    . g1 p1    . SPINE SURGERY  Sept. 2013    Social History   Social History  . Marital status: Married    Spouse name: N/A  . Number of children: 1  . Years of education: N/A    Occupational History  . Not on file.   Social History Main Topics  . Smoking status: Current Some Day Smoker    Packs/day: 1.00    Years: 50.00    Types: E-cigarettes  . Smokeless tobacco: Never Used     Comment: e-cigs  . Alcohol use No  . Drug use: No  . Sexual activity: Not on file   Other Topics Concern  . Not on file   Social History Narrative   Has 3 caffeinated bev a day      Exercise: none    Family History  Problem Relation Age of Onset  . Heart failure Mother   . Heart disease Mother   . Hypertension Mother   . Other Mother     varicose veins  . Deep vein thrombosis Mother   . Varicose Veins Mother   . Heart attack Mother   . Hypertension Sister     X 2  . Diabetes Sister   . Hyperlipidemia Sister   . Other Sister     varicose veins  . Heart disease Father   . Diabetes Father   . Hyperlipidemia Father   . Hypertension Father   . Heart attack Father   . Heart disease Brother     MI in late 35s  . Hyperlipidemia Brother   . Heart attack Brother   . Coronary artery disease    . Diabetes    . Parkinsonism Brother   . Heart failure Brother   . Hypertension Daughter     ROS: no fevers or chills, productive cough, hemoptysis, dysphasia, odynophagia, melena, hematochezia, dysuria, hematuria, rash, seizure activity, orthopnea, PND, pedal edema, claudication. Remaining systems are negative.  Physical Exam: Well-developed well-nourished in no acute distress.  Skin is warm and dry.  HEENT is normal.  Neck is supple. No bruits Chest is clear to auscultation with normal expansion.  Cardiovascular exam is regular rate and rhythm.  Abdominal exam nontender or distended. No masses palpated. Extremities show no edema. neuro grossly intact  ECG-Sinus rhythm at a rate of 82. No ST changes.  A/P  1 Subclavian steal-continue statin. No symptoms. Resume statin. She will fu with Dr Fletcher Anon for this issue.  2 abdominal aortic aneurysm-patient will need  follow-up study January 2019. Followed by vascular surgery.  3 hypertension-blood pressure controlled. Continue present medications. Potassium and renal function monitored by primary care.  4 hyperlipidemia-continue statin. Lipids and liver monitored by primary care.  5 tobacco abuse-patient counseled on discontinuing.  6 lung nodule-noted on previous abd CT in ER; we will ask the pulmonologist  to review for further evaluation.  Kirk Ruths, MD

## 2016-12-15 ENCOUNTER — Encounter: Payer: Self-pay | Admitting: Cardiology

## 2016-12-15 ENCOUNTER — Ambulatory Visit (INDEPENDENT_AMBULATORY_CARE_PROVIDER_SITE_OTHER): Payer: PPO | Admitting: Cardiology

## 2016-12-15 ENCOUNTER — Encounter: Payer: Self-pay | Admitting: Internal Medicine

## 2016-12-15 VITALS — BP 122/62 | HR 82 | Ht 64.0 in | Wt 161.0 lb

## 2016-12-15 DIAGNOSIS — E7800 Pure hypercholesterolemia, unspecified: Secondary | ICD-10-CM | POA: Diagnosis not present

## 2016-12-15 DIAGNOSIS — F17200 Nicotine dependence, unspecified, uncomplicated: Secondary | ICD-10-CM

## 2016-12-15 DIAGNOSIS — I10 Essential (primary) hypertension: Secondary | ICD-10-CM | POA: Diagnosis not present

## 2016-12-15 DIAGNOSIS — R911 Solitary pulmonary nodule: Secondary | ICD-10-CM | POA: Diagnosis not present

## 2016-12-15 MED ORDER — ASPIRIN EC 81 MG PO TBEC: 81.0000 mg | DELAYED_RELEASE_TABLET | Freq: Every day | ORAL | 3 refills | Status: DC

## 2016-12-15 NOTE — Patient Instructions (Signed)
Medication Instructions:   START ASPIRIN 81 MG ONCE DAILY  Follow-Up:  Your physician recommends that you schedule a follow-up appointment in: AS NEEDED WITH DR CRENSHAW   REFERRAL TO PULMONARY REGARDING LUNG NODULE= [phone]

## 2016-12-16 MED ORDER — MELOXICAM 7.5 MG PO TABS: 7.5000 mg | ORAL_TABLET | Freq: Every day | ORAL | 1 refills | Status: DC

## 2016-12-17 NOTE — Addendum Note (Signed)
Addended by: Cristopher Estimable on: 12/17/2016 08:18 AM   Modules accepted: Orders

## 2016-12-18 ENCOUNTER — Ambulatory Visit (INDEPENDENT_AMBULATORY_CARE_PROVIDER_SITE_OTHER)
Admission: RE | Admit: 2016-12-18 | Discharge: 2016-12-18 | Disposition: A | Discharge status: Home / Self Care | Payer: PPO | Source: Ambulatory Visit | Attending: Internal Medicine | Admitting: Internal Medicine

## 2016-12-18 ENCOUNTER — Other Ambulatory Visit: Payer: Self-pay | Admitting: *Deleted

## 2016-12-18 DIAGNOSIS — E2839 Other primary ovarian failure: Secondary | ICD-10-CM | POA: Diagnosis not present

## 2016-12-18 DIAGNOSIS — Z13820 Encounter for screening for osteoporosis: Secondary | ICD-10-CM

## 2016-12-18 MED ORDER — MELOXICAM 7.5 MG PO TABS: 7.5000 mg | ORAL_TABLET | Freq: Every day | ORAL | 1 refills | Status: DC

## 2016-12-18 NOTE — Telephone Encounter (Signed)
Rec'd call pt states MD was suppose to send her meloxicam to [facility] but they never received. Per chart [facility]. Pt states she is using [facility]. Inform will resend to correct pharmacy & update list.../lmb

## 2016-12-19 ENCOUNTER — Encounter: Payer: Self-pay | Admitting: Pulmonary Disease

## 2016-12-19 ENCOUNTER — Ambulatory Visit (INDEPENDENT_AMBULATORY_CARE_PROVIDER_SITE_OTHER): Payer: PPO | Admitting: Pulmonary Disease

## 2016-12-19 DIAGNOSIS — F17200 Nicotine dependence, unspecified, uncomplicated: Secondary | ICD-10-CM

## 2016-12-19 DIAGNOSIS — R911 Solitary pulmonary nodule: Secondary | ICD-10-CM | POA: Diagnosis not present

## 2016-12-19 NOTE — Patient Instructions (Signed)
We reviewed images of lung nodule in the right lower lung  Schedule PET scan Schedule pulmonary function tests  Based on these results, we will decide upon the next step

## 2016-12-19 NOTE — Assessment & Plan Note (Signed)
Smoking cessation was discussed and strongly advised. She is transitioned to he cigarettes and will try to quit completely

## 2016-12-19 NOTE — Progress Notes (Signed)
Subjective:    Patient ID: Sheila Melton, female    DOB: 07/13/1941, 76 y.o.   MRN: [id number]  HPI  76 year old heavy smoker presents for evaluation of incidental finding of right lower lobe lung nodule noted on CT abdomen. She developed right flank pain with hematuria and went to the emergency room. She has a known history of infrarenal AAA. CT angiogram of the abdomen 11/08/16 showed stable infrarenal 4.2 cm AAA and picked up a 5 mm left lower pole renal calculus. This also showed an incidental finding of 1310 mm nodule in the right middle lobe.  She subsequently underwent MRI of the abdomen in 11/2016 which demonstrated the renal cysts be benign. She follows up with audiologist Dr. Stanford Breed for hypertension and supplement steal syndrome. She has FSGS and chronic kidney disease for which she sees Dr. Posey Pronto. She smoked about 50 pack years starting as a teenager and was smoking about a pack per day until 2 years ago when she switched he cigarettes and has been able to cut down to one to 2 cigarettes a day.  She denies shortness of breath or chest pain or pleuritic pain. Is no wheezing and she does not use any  Inhalers. This no history of tuberculosis contact, fevers or weight loss. She has always lived in [place]  I personally reviewed all her images. I note a prior CT abdomen from 03/2015 that does not show nodules in the lung. Also chest x-ray from 09/2013 does not demonstrate any nodule in the lung fields.      Past Medical History:  Diagnosis Date  . AAA (abdominal aortic aneurysm) (HCC)    BEING WATCHED   [facility]   . Arthritis   . CAP (community acquired pneumonia) 10/24-27/14    with SIRS  . Diabetes mellitus    2  . Family history of anesthesia complication    PATIENTS MOM HAD TROUBLE WAKING UP   . Heart murmur   . Hyperlipidemia   . Hypertension    unspecified essential  . Renal insufficiency   . Subclavian steal syndrome    LEFT SIDE   NO SIDE EFFECTS  . UTI (lower  urinary tract infection) 08/26/13   Enterococcus and Escherichia coli both sensitive to nitrofurantoin      Past Surgical History:  Procedure Laterality Date  . ABDOMINAL HYSTERECTOMY    . CATARACT EXTRACTION     OS  . CYST EXCISION Right 05-23-13   Right index finger: cyst  . DILATION AND CURETTAGE OF UTERUS    . EYE SURGERY    . g1 p1    . SPINE SURGERY  Sept. 2013   Allergies  Allergen Reactions  . Amoxicillin     REACTION: rash  . Rocephin [Ceftriaxone Sodium In Dextrose]     10/24 /14 blisters of palms & diffuse rash Because of a history of documented adverse serious drug reaction;Medi Alert bracelet  is recommended  . Captopril     REACTION: cough  . Simvastatin     REACTION: buttocks cramps  . Adhesive [Tape] Rash      Social History   Social History  . Marital status: Married    Spouse name: N/A  . Number of children: 1  . Years of education: N/A   Occupational History  . Not on file.   Social History Main Topics  . Smoking status: Current Some Day Smoker    Packs/day: 1.00    Years: 50.00    Types: E-cigarettes  .  Smokeless tobacco: Never Used     Comment: e-cigs  . Alcohol use No  . Drug use: No  . Sexual activity: Not on file   Other Topics Concern  . Not on file   Social History Narrative   Has 3 caffeinated bev a day      Exercise: none      Family History  Problem Relation Age of Onset  . Heart failure Mother   . Heart disease Mother   . Hypertension Mother   . Other Mother     varicose veins  . Deep vein thrombosis Mother   . Varicose Veins Mother   . Heart attack Mother   . Hypertension Sister     X 2  . Diabetes Sister   . Hyperlipidemia Sister   . Other Sister     varicose veins  . Heart disease Father   . Diabetes Father   . Hyperlipidemia Father   . Hypertension Father   . Heart attack Father   . Heart disease Brother     MI in late 28s  . Hyperlipidemia Brother   . Heart attack Brother   . Coronary artery  disease    . Diabetes    . Parkinsonism Brother   . Heart failure Brother   . Hypertension Daughter      Review of Systems Constitutional: negative for anorexia, fevers and sweats  Eyes: negative for irritation, redness and visual disturbance  Ears, nose, mouth, throat, and face: negative for earaches, epistaxis, nasal congestion and sore throat  Respiratory: negative for cough, dyspnea on exertion, sputum and wheezing  Cardiovascular: negative for chest pain, dyspnea, lower extremity edema, orthopnea, palpitations and syncope  Gastrointestinal: negative for abdominal pain, constipation, diarrhea, melena, nausea and vomiting  Genitourinary:negative for dysuria, frequency and hematuria  Hematologic/lymphatic: negative for bleeding, easy bruising and lymphadenopathy  Musculoskeletal:negative for arthralgias, muscle weakness and stiff joints  Neurological: negative for coordination problems, gait problems, headaches and weakness  Endocrine: negative for diabetic symptoms including polydipsia, polyuria and weight loss     Objective:   Physical Exam  Gen. Pleasant, well-nourished, in no distress, normal affect ENT - no lesions, no post nasal drip Neck: No JVD, no thyromegaly, no carotid bruits Lungs: no use of accessory muscles, no dullness to percussion, clear without rales or rhonchi  Cardiovascular: Rhythm regular, heart sounds  normal, no murmurs or gallops, no peripheral edema Abdomen: soft and non-tender, no hepatosplenomegaly, BS normal. Musculoskeletal: No deformities, no cyanosis or clubbing Neuro:  alert, non focal       Assessment & Plan:

## 2016-12-19 NOTE — Assessment & Plan Note (Signed)
We reviewed images of lung nodule in the right lower lung. Given that this was not present on prior CT from 03/2015 or chest x-ray from/2014,thisisverylikelytobemalignant.   Schedule PET scan Schedule pulmonary function tests  Based on these results, we will decide upon the next step  If lung function is good and then resection may be a possibility

## 2016-12-23 DIAGNOSIS — N202 Calculus of kidney with calculus of ureter: Secondary | ICD-10-CM | POA: Diagnosis not present

## 2016-12-23 DIAGNOSIS — R310 Gross hematuria: Secondary | ICD-10-CM | POA: Diagnosis not present

## 2016-12-23 DIAGNOSIS — N281 Cyst of kidney, acquired: Secondary | ICD-10-CM | POA: Diagnosis not present

## 2016-12-23 DIAGNOSIS — N182 Chronic kidney disease, stage 2 (mild): Secondary | ICD-10-CM | POA: Diagnosis not present

## 2016-12-25 ENCOUNTER — Encounter: Payer: Self-pay | Admitting: Internal Medicine

## 2016-12-25 DIAGNOSIS — M810 Age-related osteoporosis without current pathological fracture: Secondary | ICD-10-CM | POA: Insufficient documentation

## 2017-01-02 ENCOUNTER — Encounter (HOSPITAL_COMMUNITY)
Admission: RE | Admit: 2017-01-02 | Discharge: 2017-01-02 | Disposition: A | Discharge status: Home / Self Care | Payer: PPO | Source: Ambulatory Visit | Attending: Pulmonary Disease | Admitting: Pulmonary Disease

## 2017-01-02 DIAGNOSIS — J90 Pleural effusion, not elsewhere classified: Secondary | ICD-10-CM | POA: Diagnosis not present

## 2017-01-02 DIAGNOSIS — R911 Solitary pulmonary nodule: Secondary | ICD-10-CM | POA: Diagnosis not present

## 2017-01-02 LAB — GLUCOSE, CAPILLARY: Glucose-Capillary: 169 mg/dL — ABNORMAL HIGH (ref 65–99)

## 2017-01-02 MED ORDER — FLUDEOXYGLUCOSE F - 18 (FDG) INJECTION
8.0000 | Freq: Once | INTRAVENOUS | Status: DC | PRN
Start: 2017-01-02 — End: 2017-01-08

## 2017-01-09 ENCOUNTER — Ambulatory Visit: Payer: PPO | Admitting: Adult Health

## 2017-01-09 ENCOUNTER — Encounter (HOSPITAL_COMMUNITY): Payer: PPO

## 2017-01-12 ENCOUNTER — Encounter (HOSPITAL_COMMUNITY): Payer: PPO

## 2017-01-12 ENCOUNTER — Ambulatory Visit (INDEPENDENT_AMBULATORY_CARE_PROVIDER_SITE_OTHER): Payer: PPO | Admitting: Adult Health

## 2017-01-12 ENCOUNTER — Ambulatory Visit (HOSPITAL_COMMUNITY)
Admission: RE | Admit: 2017-01-12 | Discharge: 2017-01-12 | Disposition: A | Discharge status: Home / Self Care | Payer: PPO | Source: Ambulatory Visit | Attending: Pulmonary Disease | Admitting: Pulmonary Disease

## 2017-01-12 ENCOUNTER — Encounter: Payer: Self-pay | Admitting: Adult Health

## 2017-01-12 VITALS — BP 142/66 | HR 82

## 2017-01-12 DIAGNOSIS — R911 Solitary pulmonary nodule: Secondary | ICD-10-CM | POA: Insufficient documentation

## 2017-01-12 LAB — PULMONARY FUNCTION TEST
DL/VA % pred: 83 %
DL/VA: 4.03 ml/min/mmHg/L
DLCO UNC % PRED: 73 %
DLCO unc: 17.78 ml/min/mmHg
FEF 25-75 PRE: 2.28 L/s
FEF 25-75 Post: 1.87 L/sec
FEF2575-%Change-Post: -17 %
FEF2575-%PRED-PRE: 133 %
FEF2575-%Pred-Post: 109 %
FEV1-%Change-Post: 3 %
FEV1-%PRED-POST: 98 %
FEV1-%Pred-Pre: 95 %
FEV1-POST: 2.11 L
FEV1-PRE: 2.04 L
FEV1FVC-%Change-Post: -1 %
FEV1FVC-%Pred-Pre: 107 %
FEV6-%CHANGE-POST: 4 %
FEV6-%Pred-Post: 95 %
FEV6-%Pred-Pre: 91 %
FEV6-POST: 2.59 L
FEV6-Pre: 2.48 L
FEV6FVC-%Change-Post: -2 %
FEV6FVC-%PRED-POST: 103 %
FEV6FVC-%Pred-Pre: 105 %
FVC-%Change-Post: 4 %
FVC-%Pred-Post: 92 %
FVC-%Pred-Pre: 88 %
FVC-POST: 2.65 L
FVC-Pre: 2.52 L
POST FEV6/FVC RATIO: 98 %
PRE FEV1/FVC RATIO: 81 %
Post FEV1/FVC ratio: 80 %
Pre FEV6/FVC Ratio: 100 %
RV % PRED: 77 %
RV: 1.76 L
TLC % PRED: 86 %
TLC: 4.36 L

## 2017-01-12 MED ORDER — ALBUTEROL SULFATE (2.5 MG/3ML) 0.083% IN NEBU
2.5000 mg | INHALATION_SOLUTION | Freq: Once | RESPIRATORY_TRACT | Status: AC
  Administered 2017-01-12: 2.5 mg via RESPIRATORY_TRACT

## 2017-01-12 NOTE — Patient Instructions (Signed)
Refer to thoracic surgeon for lung nodule.  Work on not smoking .  Follow up Dr. Elsworth Soho  In 3 months and As needed

## 2017-01-12 NOTE — Progress Notes (Addendum)
_0  ID: Sheila Melton, female    DOB: 1941-03-23, 76 y.o.   MRN: [id number]  Chief Complaint  Patient presents with  . Follow-up    Lung nodule     Referring provider: Binnie Rail, MD  HPI: 76 year old female with heavy smoking history seen for pulmonary consult December 29, 2016 for an incidental finding of a RML lung nodule noted on CT abdomen. Patient has a known history of an infra renal AAA.    01/12/2017 Follow up : Lung nodule  Patient returns for a one-month follow-up. Patient was seen last month for a pulmonary consult for a right middle lobe lung nodule. This was found on incidental finding on a CT abdomen following a AAA.  She  was set up for a PET scan that showed a 9 mm right middle lobe nodule with mild hypermetabolism. No findings for metastatic disease.  PFT showed normal lung function with an FEV1 at 98%, ratio 80, FVC 92%, no significant bronchodilator response, DLCO 73%.. Discussed discuss her test results.. She has stop smoking cigarettes and is currently smoking cigarettes. We discussed smoking cessation. She denies any hemoptysis, chest pain, or unintentional weight loss.        Allergies  Allergen Reactions  . Amoxicillin     REACTION: rash  . Rocephin [Ceftriaxone Sodium In Dextrose]     10/24 /14 blisters of palms & diffuse rash Because of a history of documented adverse serious drug reaction;Medi Alert bracelet  is recommended  . Captopril     REACTION: cough  . Simvastatin     REACTION: buttocks cramps  . Adhesive [Tape] Rash    Immunization History  Administered Date(s) Administered  . Influenza Split 08/28/2011, 09/07/2012  . Influenza Whole 09/06/2008, 08/30/2009, 10/11/2010  . Influenza, High Dose Seasonal PF 08/24/2013, 08/18/2014, 08/07/2015, 08/08/2016  . Pneumococcal Conjugate-13 09/26/2015  . Pneumococcal Polysaccharide-23 12/10/2009    Past Medical History:  Diagnosis Date  . AAA (abdominal aortic aneurysm) (HCC)    BEING  WATCHED   [facility]   . Arthritis   . CAP (community acquired pneumonia) 10/24-27/14    with SIRS  . Diabetes mellitus    2  . Family history of anesthesia complication    PATIENTS MOM HAD TROUBLE WAKING UP   . Heart murmur   . Hyperlipidemia   . Hypertension    unspecified essential  . Renal insufficiency   . Subclavian steal syndrome    LEFT SIDE   NO SIDE EFFECTS  . UTI (lower urinary tract infection) 08/26/13   Enterococcus and Escherichia coli both sensitive to nitrofurantoin    Tobacco History: History  Smoking Status  . Current Some Day Smoker  . Packs/day: 1.00  . Years: 50.00  . Types: E-cigarettes  Smokeless Tobacco  . Never Used    Comment: e-cigs   Ready to quit: Yes Counseling given: Yes   Outpatient Encounter Prescriptions as of 01/12/2017  Medication Sig  . amLODipine (NORVASC) 5 MG tablet Take 1 tablet (5 mg total) by mouth 2 (two) times daily. Yearly physical due in March must see MD for future refills  . aspirin EC 81 MG tablet Take 1 tablet (81 mg total) by mouth daily.  . B Complex-C (SUPER B COMPLEX PO) Take 1 tablet by mouth daily.   . Blood Glucose Monitoring Suppl (ONE TOUCH ULTRA 2) w/Device KIT Use as advised  . Cholecalciferol (VITAMIN D3) 2000 UNITS TABS Take 2,000 Units by mouth daily.   . diazepam (VALIUM)  5 MG tablet Take 1 tablet (5 mg total) by mouth at bedtime as needed.  . fexofenadine (ALLEGRA) 180 MG tablet Take 180 mg by mouth at bedtime.   Marland Kitchen FLUoxetine (PROZAC) 10 MG capsule Take 1 capsule (10 mg total) by mouth daily.  . furosemide (LASIX) 40 MG tablet TAKE 1/2 TO 1 TABLET DAILY AS NEEDED FOR FLUID  . glipiZIDE (GLUCOTROL) 5 MG tablet TAKE 2 TABLETS IN AM AND 1 TAB IN PM  . glucose blood (ONE TOUCH ULTRA TEST) test strip Use to test blood sugar once daily as instructed. Dx: E11.59  . labetalol (NORMODYNE) 300 MG tablet Take 1 tablet (300 mg total) by mouth 2 (two) times daily. Yearly physical due in March must see MD for future refills    . meloxicam (MOBIC) 7.5 MG tablet Take 1 tablet (7.5 mg total) by mouth daily.  . metFORMIN (GLUCOPHAGE) 500 MG tablet Take 1 tablet (500 mg total) by mouth 2 (two) times daily.  . pravastatin (PRAVACHOL) 40 MG tablet Take 1 tablet (40 mg total) by mouth daily. Yearly physical due in March must see MD for refills  . valsartan (DIOVAN) 320 MG tablet Take 1 tablet (320 mg total) by mouth at bedtime. Yearly physical due in March must see MD for future refills  . vitamin E 400 UNIT capsule Take 400 Units by mouth daily.   No facility-administered encounter medications on file as of 01/12/2017.      Review of Systems  Constitutional:   No  weight loss, night sweats,  Fevers, chills, fatigue, or  lassitude.  HEENT:   No headaches,  Difficulty swallowing,  Tooth/dental problems, or  Sore throat,                No sneezing, itching, ear ache, nasal congestion, post nasal drip,   CV:  No chest pain,  Orthopnea, PND, swelling in lower extremities, anasarca, dizziness, palpitations, syncope.   GI  No heartburn, indigestion, abdominal pain, nausea, vomiting, diarrhea, change in bowel habits, loss of appetite, bloody stools.   Resp: No shortness of breath with exertion or at rest.  No excess mucus, no productive cough,  No non-productive cough,  No coughing up of blood.  No change in color of mucus.  No wheezing.  No chest wall deformity  Skin: no rash or lesions.  GU: no dysuria, change in color of urine, no urgency or frequency.  No flank pain, no hematuria   MS:  No joint pain or swelling.  No decreased range of motion.  No back pain.    Physical Exam  BP (!) 142/66 (BP Location: Left Arm, Cuff Size: Normal)   Pulse 82   SpO2 99%   GEN: A/Ox3; pleasant , NAD, well nourished    HEENT:  [place]/AT,  EACs-clear, TMs-wnl, NOSE-clear, THROAT-clear, no lesions, no postnasal drip or exudate noted.   NECK:  Supple w/ fair ROM; no JVD; normal carotid impulses w/o bruits; no thyromegaly or nodules  palpated; no lymphadenopathy.    RESP  Clear  P & A; w/o, wheezes/ rales/ or rhonchi. no accessory muscle use, no dullness to percussion  CARD:  RRR, no m/r/g, no peripheral edema, pulses intact, no cyanosis or clubbing.  GI:   Soft & nt; nml bowel sounds; no organomegaly or masses detected.   Musco: Warm bil, no deformities or joint swelling noted.   Neuro: alert, no focal deficits noted.    Skin: Warm, no lesions or rashes     BNP  No results found for: BNP  ProBNP No results found for: PROBNP  Imaging: Nm Pet Image Initial (pi) Skull Base To Thigh  Result Date: 01/02/2017 CLINICAL DATA:  Initial treatment strategy for solitary pulmonary nodule. Smoker. EXAM: NUCLEAR MEDICINE PET SKULL BASE TO THIGH TECHNIQUE: 8.0 MCi F-18 FDG was injected intravenously. Full-ring PET imaging was performed from the skull base to thigh after the radiotracer. CT data was obtained and used for attenuation correction and anatomic localization. FASTING BLOOD GLUCOSE:  Value: 169 mg/dl COMPARISON:  MRI abdomen dated 12/03/2016. CT abdomen/pelvis dated 11/08/2016. FINDINGS: NECK No hypermetabolic lymph nodes in the neck. CHEST Evaluation of the lung parenchyma is constrained by respiratory motion. 9 mm right middle lobe nodule (series 8/ image 34) with mild hypermetabolism, max SUV 1.5. No focal consolidation. Trace right pleural effusion. No pneumothorax. Heart is normal in size. Trace pericardial effusion. Three vessel coronary atherosclerosis. No evidence of thoracic aortic aneurysm. Atherosclerotic calcifications of the aortic arch. No hypermetabolic thoracic lymphadenopathy. ABDOMEN/PELVIS No abnormal hypermetabolic activity within the liver, pancreas, adrenal glands, or spleen. 4.4 cm infrarenal abdominal aortic aneurysm (series 4/image 127) with eccentric mural thrombus. Atherosclerotic calcifications the abdominal aorta and branch vessels. 6 mm nonobstructing left lower pole renal calculus (series 4/image  114). 4.4 cm medial right lower pole renal cyst (series 4/image 123). Status post hysterectomy. No hypermetabolic lymph nodes in the abdomen or pelvis. SKELETON No focal hypermetabolic activity to suggest skeletal metastasis. IMPRESSION: 9 mm right middle lobe nodule with mild hypermetabolism, worrisome for early primary bronchogenic neoplasm. No findings specific for metastatic disease. Trace right pleural effusion. 4.4 cm infrarenal abdominal aortic aneurysm. Recommend followup by [place] in 1 year. This recommendation follows ACR consensus guidelines: White Paper of the ACR Incidental Findings Committee II on Vascular Findings. J Am Coll Radiol 2013; 10:789-794. Additional ancillary findings as above. Electronically Signed   By: Julian Hy M.D.   On: 01/02/2017 10:13   Dg Bone Density  Result Date: 12/21/2016 Date of study: 12/18/16 Exam: DUAL X-RAY ABSORPTIOMETRY (DXA) FOR BONE MINERAL DENSITY (BMD) Instrument: [employer] Requesting Provider: PCP Indication: screening for osteoporosis Comparison: none (please note that it is not possible to compare data from different instruments) Clinical data: Pt is a 76 y.o. female without history of fracture. Results:  Lumbar spine (L1-L4) Femoral neck (FN) 33% distal radius T-score 0.2 RFN: - 2.1 LFN: - 1.9 -2.9 Change in BMD from previous DXA test (%) n/a n/a n/a (*) statistically significant Assessment: Patient has OSTEOPOROSIS according to the [facility] classification for osteoporosis (see below). Fracture risk: high Comments: the technical quality of the study is good  L3 and L4 were excluded from the study due to hardware present. Evaluation for secondary causes should be considered if clinically indicated. Recommend optimizing calcium (1200 mg/day) and vitamin D (800 IU/day). Followup: Repeat BMD is appropriate after 2 years or after 1-2 years if starting treatment. WHO criteria for diagnosis of osteoporosis in postmenopausal women and in men 24 y/o or older: -  normal: T-score -1.0 to + 1.0 - osteopenia/low bone density: T-score between -2.5 and -1.0 - osteoporosis: T-score below -2.5 - severe osteoporosis: T-score below -2.5 with history of fragility fracture Note: although not part of the WHO classification, the presence of a fragility fracture, regardless of the T-score, should be considered diagnostic of osteoporosis, provided other causes for the fracture have been excluded. Treatment: The National Osteoporosis Foundation recommends that treatment be considered in postmenopausal women and men age 33 or older with:  1. Hip or vertebral (clinical or morphometric) fracture 2. T-score of - 2.5 or lower at the spine or hip 3. 10-year fracture probability by FRAX of at least 20% for a major osteoporotic fracture and 3% for a hip fracture Sheila Pardon MD     Assessment & Plan:   Solitary pulmonary nodule Small 9 mm hypermetabolic RML lung nodule in smoker - pt has preserved lung function on PFT  We discussed test results and possibility this may represent underlying malignancy , we discussed surgical referral vs serial CT chest in 3 months . She prefers referral .  Will refer to Thoracic surgery for evaluation for possible resection .       Sheila Liskey, NP 01/12/2017   Low-grade PET-positive nodule in this heavy smoker. We've reviewed and discussed images with the patient and her husband. 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 Surprisingly her lung function is quite normal, DLCO slightly decreased at 73%. We discussed that resection is the best way forward on this scenario. We discussed the morbidity associated with a CT-guided needle biopsy of the nodule and this peripheral location. They appreciated that there is still a small chance that this could be a benign nodule  She has also been diagnosed with FSGS, being treated by Dr. Posey Pronto, had a rough time with high-dose steroids and immunosuppressants are being  considered. Patient and her husband were agreeable with proceeding with thoracic surgery consultation.  Greater than 50% time was spent in counseling and coordination of care with the patient   Sheila Melton. MD

## 2017-01-12 NOTE — Assessment & Plan Note (Signed)
Small 9 mm hypermetabolic RML lung nodule in smoker - pt has preserved lung function on PFT  We discussed test results and possibility this may represent underlying malignancy , we discussed surgical referral vs serial CT chest in 3 months . She prefers referral .  Will refer to Thoracic surgery for evaluation for possible resection .

## 2017-01-14 ENCOUNTER — Other Ambulatory Visit: Payer: Self-pay | Admitting: Internal Medicine

## 2017-01-19 ENCOUNTER — Other Ambulatory Visit: Payer: Self-pay | Admitting: Internal Medicine

## 2017-01-20 ENCOUNTER — Encounter: Payer: Self-pay | Admitting: Internal Medicine

## 2017-01-20 ENCOUNTER — Other Ambulatory Visit: Payer: Self-pay

## 2017-01-20 ENCOUNTER — Institutional Professional Consult (permissible substitution) (INDEPENDENT_AMBULATORY_CARE_PROVIDER_SITE_OTHER): Payer: PPO | Admitting: Thoracic Surgery (Cardiothoracic Vascular Surgery)

## 2017-01-20 ENCOUNTER — Encounter: Payer: Self-pay | Admitting: Thoracic Surgery (Cardiothoracic Vascular Surgery)

## 2017-01-20 ENCOUNTER — Ambulatory Visit (INDEPENDENT_AMBULATORY_CARE_PROVIDER_SITE_OTHER): Payer: PPO | Admitting: Internal Medicine

## 2017-01-20 VITALS — BP 138/80 | HR 85 | Ht 63.5 in | Wt 165.0 lb

## 2017-01-20 VITALS — BP 130/73 | HR 92 | Resp 20 | Ht 63.5 in | Wt 165.0 lb

## 2017-01-20 DIAGNOSIS — R911 Solitary pulmonary nodule: Secondary | ICD-10-CM

## 2017-01-20 DIAGNOSIS — N051 Unspecified nephritic syndrome with focal and segmental glomerular lesions: Secondary | ICD-10-CM | POA: Diagnosis not present

## 2017-01-20 DIAGNOSIS — N2581 Secondary hyperparathyroidism of renal origin: Secondary | ICD-10-CM | POA: Diagnosis not present

## 2017-01-20 DIAGNOSIS — E1159 Type 2 diabetes mellitus with other circulatory complications: Secondary | ICD-10-CM

## 2017-01-20 DIAGNOSIS — D631 Anemia in chronic kidney disease: Secondary | ICD-10-CM | POA: Diagnosis not present

## 2017-01-20 LAB — POCT GLYCOSYLATED HEMOGLOBIN (HGB A1C): Hemoglobin A1C: 6.4

## 2017-01-20 NOTE — Progress Notes (Signed)
[address]       [place] [address]             [phone]      PCP is Binnie Rail, [name], [name], NP  Chief Complaint  Patient presents with  . Lung Lesion    Surgical eva on 9 mm right middle lobe nodule , PET Scan 01/02/17,PFT 01/12/17    HPI: 76 year old woman sent for consultation regarding a right middle lobe nodule.  Mrs. Circle is a 76 year old woman with a 50-pack-year history of tobacco abuse who was incidentally found to have a right middle lobe nodule. She was evaluated for hematuria and had a CT of the abdomen and pelvis. It showed a 10 x 13 mm right middle lobe nodule. Her renal issues turned out to be kidney stones when she was told she did not have a urinary tract infection. She saw Dr. Stanford Breed for an annual visit and he mentioned that she had a lung nodule. She was referred to Dr. Elsworth Soho. A follow-up scan with a PET/CT was arranged. It showed the 9 x 13 mm nodule was mildly hypermetabolic with an SUV of 1.5. She also had pulmonary function testing.  She continues to have hematuria. She also complains of decreased energy. She denies any shortness of breath, chest pain, pressure, or tightness with exertion. He has not had any change in appetite or weight loss. She does complain of decreased energy. She says that sometimes when she first lies down she has take a couple of deep breast to get her breath but no true orthopnea or paroxysmal dyspnea. She is very nervous about the possible diagnosis of cancer. She smoked a pack a day for 50 years. She currently is using E cigarettes.  Zubrod Score: At the time of surgery this patient's most appropriate activity status/level should be described as: '[x]'$     0    Normal activity, no symptoms '[]'$     1    Restricted in physical strenuous activity but ambulatory, able to do out light work '[]'$     2    Ambulatory and capable of self care, unable to do work activities, up and about >50 % of  waking hours                              '[]'$     3    Only limited self care, in bed greater than 50% of waking hours '[]'$     4    Completely disabled, no self care, confined to bed or chair '[]'$     5    Moribund   Past Medical History:  Diagnosis Date  . AAA (abdominal aortic aneurysm) (HCC)    BEING WATCHED   VVS   . Arthritis   . CAP (community acquired pneumonia) 10/24-27/14    with SIRS  . Diabetes mellitus    2  . Family history of anesthesia complication    PATIENTS MOM HAD TROUBLE WAKING UP   . Heart murmur   . Hyperlipidemia   . Hypertension    unspecified essential  . Renal insufficiency   . Subclavian steal syndrome    LEFT SIDE   NO SIDE EFFECTS  . UTI (lower urinary tract infection) 08/26/13   Enterococcus and Escherichia coli both sensitive to nitrofurantoin    Past Surgical History:  Procedure Laterality Date  . ABDOMINAL HYSTERECTOMY    .  CATARACT EXTRACTION     OS  . CYST EXCISION Right 05-23-13   Right index finger: cyst  . DILATION AND CURETTAGE OF UTERUS    . EYE SURGERY    . g1 p1    . SPINE SURGERY  Sept. 2013    Family History  Problem Relation Age of Onset  . Heart failure Mother   . Heart disease Mother   . Hypertension Mother   . Other Mother     varicose veins  . Deep vein thrombosis Mother   . Varicose Veins Mother   . Heart attack Mother   . Hypertension Sister     X 2  . Diabetes Sister   . Hyperlipidemia Sister   . Other Sister     varicose veins  . Heart disease Father   . Diabetes Father   . Hyperlipidemia Father   . Hypertension Father   . Heart attack Father   . Heart disease Brother     MI in late 48s  . Hyperlipidemia Brother   . Heart attack Brother   . Coronary artery disease    . Diabetes    . Parkinsonism Brother   . Heart failure Brother   . Hypertension Daughter     Social History Social History  Substance Use Topics  . Smoking status: Current Some Day Smoker    Packs/day: 1.00    Years: 50.00     Types: E-cigarettes  . Smokeless tobacco: Never Used     Comment: e-cigs  . Alcohol use No    Current Outpatient Prescriptions  Medication Sig Dispense Refill  . amLODipine (NORVASC) 5 MG tablet TAKE 1 TABLET TWICE DAILY. 180 tablet 3  . Ascorbic Acid (VITAMIN C PO) Take by mouth.    Marland Kitchen aspirin EC 81 MG tablet Take 1 tablet (81 mg total) by mouth daily. 90 tablet 3  . B Complex-C (SUPER B COMPLEX PO) Take 1 tablet by mouth daily.     . Blood Glucose Monitoring Suppl (ONE TOUCH ULTRA 2) w/Device KIT Use as advised 1 each 0  . Cholecalciferol (VITAMIN D3) 2000 UNITS TABS Take 2,000 Units by mouth daily.     . diazepam (VALIUM) 5 MG tablet Take 1 tablet (5 mg total) by mouth at bedtime as needed. 90 tablet 0  . fexofenadine (ALLEGRA) 180 MG tablet Take 180 mg by mouth at bedtime.     Marland Kitchen FLUoxetine (PROZAC) 10 MG capsule Take 1 capsule (10 mg total) by mouth daily. 90 capsule 3  . furosemide (LASIX) 40 MG tablet TAKE 1/2 TO 1 TABLET DAILY AS NEEDED FOR FLUID 90 tablet 3  . glipiZIDE (GLUCOTROL) 5 MG tablet TAKE 2 TABLETS IN AM AND 1 TAB IN PM 270 tablet 3  . labetalol (NORMODYNE) 300 MG tablet TAKE 1 TABLET TWICE DAILY. 180 tablet 3  . meloxicam (MOBIC) 7.5 MG tablet Take 1 tablet (7.5 mg total) by mouth daily. 90 tablet 1  . metFORMIN (GLUCOPHAGE) 500 MG tablet Take 1 tablet (500 mg total) by mouth 2 (two) times daily. 180 tablet 3  . ONE TOUCH ULTRA TEST test strip CHECK BLOOD SUGAR ONCE A DAY. 100 each 0  . pravastatin (PRAVACHOL) 40 MG tablet TAKE 1 TABLET DAILY. 90 tablet 3  . TRAMADOL HCL PO Take 50 mg by mouth as needed.     . valsartan (DIOVAN) 320 MG tablet TAKE ONE TABLET AT BEDTIME. 90 tablet 3  . vitamin E 400 UNIT capsule Take 400 Units by  mouth daily.     No current facility-administered medications for this visit.     Allergies  Allergen Reactions  . Amoxicillin     REACTION: rash  . Rocephin [Ceftriaxone Sodium In Dextrose]     10/24 /14 blisters of palms & diffuse  rash Because of a history of documented adverse serious drug reaction;Medi Alert bracelet  is recommended  . Captopril     REACTION: cough  . Simvastatin     REACTION: buttocks cramps  . Adhesive [Tape] Rash    Review of Systems  Constitutional: Positive for fatigue. Negative for appetite change and unexpected weight change.  Eyes: Negative for visual disturbance.  Respiratory: Negative for cough, shortness of breath and wheezing.   Cardiovascular: Negative for chest pain and leg swelling.  Gastrointestinal: Negative for abdominal pain and blood in stool.  Genitourinary: Positive for hematuria. Negative for dysuria.  Musculoskeletal: Positive for arthralgias, back pain (Chronic) and joint swelling.  Neurological: Negative for seizures, syncope, weakness and headaches.  Hematological: Negative for adenopathy. Bruises/bleeds easily.  Psychiatric/Behavioral: Positive for dysphoric mood. The patient is nervous/anxious.   All other systems reviewed and are negative.   BP 130/73   Pulse 92   Resp 20   Ht 5' 3.5" (1.613 m)   Wt 165 lb (74.8 kg)   SpO2 93% Comment: RA  BMI 28.77 kg/m  Physical Exam  Constitutional: She is oriented to person, place, and time. She appears well-developed and well-nourished. No distress.  HENT:  Head: Normocephalic and atraumatic.  Mouth/Throat: No oropharyngeal exudate.  Eyes: Conjunctivae and EOM are normal. Pupils are equal, round, and reactive to light. No scleral icterus.  Neck: Neck supple. No thyromegaly present.  Cardiovascular: Normal rate, regular rhythm and intact distal pulses.  Exam reveals no gallop and no friction rub.   No murmur heard. Pulmonary/Chest: Effort normal and breath sounds normal. No respiratory distress. She has no wheezes. She has no rales.  Abdominal: Soft. She exhibits no distension. There is no tenderness.  Musculoskeletal: She exhibits no edema.  Lymphadenopathy:    She has no cervical adenopathy.  Neurological: She  is alert and oriented to person, place, and time. No cranial nerve deficit.  No focal motor deficit  Skin: Skin is warm and dry.  Vitals reviewed.   Diagnostic Tests: CTA ABDOMEN AND PELVIS WITHOUT AND WITH CONTRAST  TECHNIQUE: Multidetector CT imaging of the abdomen and pelvis was performed using the standard protocol during bolus administration of intravenous contrast. Multiplanar reconstructed images and MIPs were obtained and reviewed to evaluate the vascular anatomy.  CONTRAST:  80 cc of Isovue 370.  The GFR was 38.  COMPARISON:  CT abdomen from 03/19/2015, CXR 09/19/2013  FINDINGS: VASCULAR  Aorta: 4.2 cm infrarenal abdominal aortic aneurysm without rupture. Soft plaque noted along the anterior and lateral walls. Aneurysm terminates at the bifurcation.  Celiac: Small vascular web versus mild stenosis just distal to the origin of the celiac, series 4, image 44. The origin is not visualized and stenosis is not entirely excluded.  SMA: Mild atherosclerosis at the origin of the SMA. SMA is patent without significant stenosis.  Renals: Single renal arteries to both kidneys without significant stenosis. No evidence of aneurysm or fibromuscular dysplasia.  IMA: Patent without evidence of aneurysm, dissection, vasculitis or significant stenosis.  Inflow: There is atherosclerosis of both common iliac arteries without aneurysm. Both common iliac arteries measuring 3 mm in caliber.  Proximal Outflow: Right common femoral artery 7 mm in diameter and the  left 5 mm with calcified plaque noted.  Veins: No obvious venous abnormality within the limitations of this arterial phase study.  Review of the MIP images confirms the above findings.  NON-VASCULAR  Lower chest: The heart is normal in size. There is coronary arteriosclerosis. Right middle lobe 13 x 10 mm nodule is noted not apparent on prior comparison CT nor visualized on prior chest radiograph from  09/19/2013.  Hepatobiliary: No space-occupying mass of the liver. Contracted gallbladder without stones. No biliary dilatation.  Pancreas: No pancreatic mass.  Pancreatic duct is normal in caliber.  Spleen: No splenomegaly.  Adrenals/Urinary Tract: Normal adrenal glands. Nonobstructing lower pole left renal calculus measuring 5 mm. Small extrarenal pelves are noted bilaterally with a simple cyst cyst in the lower pole the right kidney measuring 4.4 x 4.8 cm. Smaller upper pole cysts are noted too small to further characterize.  Stomach/Bowel: Stomach is within normal limits. Appendix appears normal. No evidence of bowel wall thickening, distention, or inflammatory changes.  Lymphatic: No enlarged abdominal or pelvic lymph nodes.  Reproductive: Hysterectomy.  Other: Small fat containing umbilical hernia. No ascites or free air.  Musculoskeletal: Lumbar disc fusion from L3 through S1 with degenerative disc disease at L2-3 and L5-S1. Low lung volume No acute osseous abnormality.  IMPRESSION: VASCULAR  Stable 4.2 cm infrarenal abdominal aortic aneurysm.  Possible tiny vascular lab in the proximal celiac artery. The origin of the right celiac artery is not identified and soft plaque is not excluded versus focal stenosis.  NON-VASCULAR  13 x 10 mm right middle lobe pulmonary nodule. Consider one of the following in 3 months for both low-risk and high-risk individuals: (a) repeat chest CT, (b) follow-up PET-CT, or (c) tissue sampling. This recommendation follows the consensus statement: Guidelines for Management of Incidental Pulmonary Nodules Detected on CT Images: From the Fleischner Society 2017; Radiology 2017; 284:228-243.  Nonobstructing 5 mm left renal stone with bilateral renal cysts. No obstructive uropathy.  L3 through L5 lumbar disc fusion. Degenerative disc disease above and below the fusion.   Electronically Signed   By: Ashley Royalty  M.D.   On: 11/08/2016 03:26 NUCLEAR MEDICINE PET SKULL BASE TO THIGH  TECHNIQUE: 8.0 MCi F-18 FDG was injected intravenously. Full-ring PET imaging was performed from the skull base to thigh after the radiotracer. CT data was obtained and used for attenuation correction and anatomic localization.  FASTING BLOOD GLUCOSE:  Value: 169 mg/dl  COMPARISON:  MRI abdomen dated 12/03/2016. CT abdomen/pelvis dated 11/08/2016.  FINDINGS: NECK  No hypermetabolic lymph nodes in the neck.  CHEST  Evaluation of the lung parenchyma is constrained by respiratory motion.  9 mm right middle lobe nodule (series 8/ image 34) with mild hypermetabolism, max SUV 1.5. No focal consolidation. Trace right pleural effusion. No pneumothorax.  Heart is normal in size. Trace pericardial effusion. Three vessel coronary atherosclerosis. No evidence of thoracic aortic aneurysm. Atherosclerotic calcifications of the aortic arch.  No hypermetabolic thoracic lymphadenopathy.  ABDOMEN/PELVIS  No abnormal hypermetabolic activity within the liver, pancreas, adrenal glands, or spleen.  4.4 cm infrarenal abdominal aortic aneurysm (series 4/image 127) with eccentric mural thrombus. Atherosclerotic calcifications the abdominal aorta and branch vessels.  6 mm nonobstructing left lower pole renal calculus (series 4/image 114). 4.4 cm medial right lower pole renal cyst (series 4/image 123). Status post hysterectomy.  No hypermetabolic lymph nodes in the abdomen or pelvis.  SKELETON  No focal hypermetabolic activity to suggest skeletal metastasis.  IMPRESSION: 9 mm right middle lobe nodule  with mild hypermetabolism, worrisome for early primary bronchogenic neoplasm.  No findings specific for metastatic disease.  Trace right pleural effusion.  4.4 cm infrarenal abdominal aortic aneurysm. Recommend followup by [place] in 1 year. This recommendation follows ACR consensus  guidelines: White Paper of the ACR Incidental Findings Committee II on Vascular Findings. J Am Coll Radiol 2013; 10:789-794.  Additional ancillary findings as above.   Electronically Signed   By: Julian Hy M.D.   On: 01/02/2017 10:13 I personally reviewed the CT and PET/CT and concur with the findings noted above. I do think this nodule may well be benign, but certainly cannot rule out a low-grade cancer.  Pulmonary function testing FVC 2.52 (88%) FEV1 2.04 (95%) TLC 4.36 (86%) DLCO 17.78 (73%)  Impression: Mrs. 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 She had a CT of the abdomen back in December to evaluate for hematuria. She was noted to have a right middle lobe lung nodule. This is a roughly 9 x 13 mm and is relatively smoothly marginated. On PET/CT the nodule is mildly hypermetabolic with an SUV of 1.5. Differential diagnosis includes infectious and inflammatory nodules, however neoplasm is more likely. This could represent either a carcinoid tumor or a primary bronchogenic carcinoma. Biopsy is indicated for definitive diagnosis. Even if this is a primary bronchogenic carcinoma, it is likely is a relatively well-differentiated low-grade tumor.  I had a long discussion with Mr. and Mrs. Malak and reviewed the CT and PET images with them. We discussed possible means for establishing a diagnosis as well as treating the nodule. She is very anxious about the nodule and wishes to have it removed surgically. She does understand there are other reasonable options including radiographic follow-up or stereotactic radiation. She is not interested in those approaches.  I discussed the proposed procedure of right VATS and right middle lobectomy  with Mrs. Fate and her husband. They understand that due to the location of the tumor, a wedge resection likely render the remaining middle lobe nonfunctional. They understand this would be done in the operating room under general anesthesia. We discussed the incisions to be used and the need for drainage to postoperatively. We discussed the expected hospital stay and the overall recovery. I reviewed the indications, risks, benefits, and alternatives. They understand the risk include, but are not limited to death, MI, DVT, PE, stroke, bleeding, possible need for transfusion, infection, prolonged air leak, cardiac arrhythmias, as well as the possibility of other unforeseeable complications. She understands and accepts the risk and wishes to proceed. She would like to wait until after Easter to have the procedure done.  Plan: Right VATS, right middle lobectomy on Monday, 02/16/2017.  [name], MD [facility] [phone]

## 2017-01-20 NOTE — Addendum Note (Signed)
Addended by: Caprice Beaver T on: 01/20/2017 01:49 PM   Modules accepted: Orders

## 2017-01-20 NOTE — Patient Instructions (Signed)
Please continue: - Glipizide to 5 mg before b'fast and 10 mg before dinner. - Metformin 500 mg daily 2x a day  Please return in 3 months with your sugar log.

## 2017-01-20 NOTE — Progress Notes (Signed)
Patient ID: Sheila Melton, female   DOB: 11-01-1941, 76 y.o.   MRN: [id number]  HPI: Sheila Melton is a 76 y.o.-year-old female, returning for follow-up for  DM2, dx in ~2006, non-insulin-dependent, controlled, with complications (stage 3 CKD, circulatory complications - AAA). Last visit 3 months ago.  She has a lung nodule >> may need to have surgery.  Last hemoglobin A1c was: Lab Results  Component Value Date   HGBA1C 6.6 08/25/2016   HGBA1C 7.2 04/29/2016   HGBA1C 6.9 12/31/2015   Pt is on a regimen of: - Metformin 500 mg daily 2x a day - Glipizide 10 >> 7.5 >> 5 mg in am and 5 >> 7.5 >> 10 mg in pm  We stopped Actos 15 mg daily in 10/2015 as she felt that she gained 20 pounds after she started Actos. She was previously on metformin, but this was stopped due to her CKD. She was cleared by her nephrologist, Dr. Posey Pronto to restart metformin.   Pt checks her sugars 1x a day and they are: - am: 79-96 >> 86, 102-134, 148 >> 102-130, 158 - in last 2 weeks >> 122- 153, 171  (new meter) - 2h after b'fast: 134-167 >> 80-120 >> n/c >> 111-142, 154 >> 125-161 >> 145-202 - before lunch: 137 >> 120s >> 69-136, 216x1 >> 234 x1 >> 171, 180 >> 260 - 2h after lunch: 124-164 >> n/c >> 78-159 >> 127 >> 133, 149 in last 2 weeks >> 116-186, 203 - before dinner: 118-135, 174 >> 150-180 >> n/c >> 137, 183 >> 80 >> n/c - 2h after dinner: 151-185 >> 220s >> 152-213, 257, 276 >> 124-225 >> 96-162, 220 >> 145, 151, 226 - bedtime: n/c >> 128-164 >> n/c - nighttime: n/c No lows. Lowest sugar was 86 >> 80 >> 106; ? hypoglycemia awareness.  Highest sugar was 297 >> 254 >> 260.  Glucometer: One Touch   Pt's meals are: - Breakfast: cereals, bacon + eggs - Lunch: cheese crackers - Dinner: meat + veggies - Snacks: 1 or 2  - + CKD stage 3, last BUN/creatinine:  Lab Results  Component Value Date   BUN 26 (H) 11/07/2016   CREATININE 1.34 (H) 11/07/2016  On Diovan. - last set of lipids: Lab Results  Component  Value Date   CHOL 245 (H) 09/26/2015   HDL 40.90 09/26/2015   LDLCALC 123 (H) 05/24/2014   LDLDIRECT 118.0 09/26/2015   TRIG 322.0 (H) 09/26/2015   CHOLHDL 6 09/26/2015  On Pravastatin. - last eye exam was 04/2016 No DR. Dr Idolina Primer.   - no numbness and tingling in her feet.  She also has HTN, HL. 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   ROS: Constitutional: no weight loss/gain, + fatigue, no subjective hypothermia Eyes: + blurry vision, no xerophthalmia ENT: no sore throat, no nodules palpated in throat, no dysphagia/odynophagia, no hoarseness Cardiovascular: no CP/SOB/palpitations/leg swelling Respiratory: no cough/SOB Gastrointestinal: no N/V/D/C/+ heartburn Musculoskeletal: no muscle/+ joint aches (back pain) Skin: no rashes Neurological: no tremors/numbness/tingling/dizziness  I reviewed pt's medications, allergies, PMH, social hx, family hx, and changes were documented in the history of present illness. Otherwise, unchanged from my initial visit note.  Past Medical History:  Diagnosis Date  . AAA (abdominal aortic aneurysm) (HCC)    BEING WATCHED   VVS   . Arthritis   . CAP (community acquired pneumonia) 10/24-27/14  with SIRS  . Diabetes mellitus    2  . Family history of anesthesia complication    PATIENTS MOM HAD TROUBLE WAKING UP   . Heart murmur   . Hyperlipidemia   . Hypertension    unspecified essential  . Renal insufficiency   . Subclavian steal syndrome    LEFT SIDE   NO SIDE EFFECTS  . UTI (lower urinary tract infection) 08/26/13   Enterococcus and Escherichia coli both sensitive to nitrofurantoin   Past Surgical History:  Procedure Laterality Date  . ABDOMINAL HYSTERECTOMY    . CATARACT EXTRACTION     OS  . CYST EXCISION Right 05-23-13   Right index finger: cyst  . DILATION AND CURETTAGE OF UTERUS    . EYE SURGERY    . g1 p1    . SPINE SURGERY  Sept. 2013   Social  History   Social History  . Marital Status: Married    Spouse Name: N/A  . Number of Children: 1   Occupational History  . retied   Social History Main Topics  . Smoking status: Current Some Day Smoker -- 1.00 packs/day for 50 years    Types: E-cigarettes  . Smokeless tobacco: Never Used  . Alcohol Use: No  . Drug Use: No  . Sexual Activity: Not on file   Other Topics Concern  . Not on file   Social History Narrative   Has 3 caffeinated bev a day   Current Outpatient Prescriptions on File Prior to Visit  Medication Sig Dispense Refill  . amLODipine (NORVASC) 5 MG tablet TAKE 1 TABLET TWICE DAILY. 180 tablet 3  . aspirin EC 81 MG tablet Take 1 tablet (81 mg total) by mouth daily. 90 tablet 3  . B Complex-C (SUPER B COMPLEX PO) Take 1 tablet by mouth daily.     . Blood Glucose Monitoring Suppl (ONE TOUCH ULTRA 2) w/Device KIT Use as advised 1 each 0  . Cholecalciferol (VITAMIN D3) 2000 UNITS TABS Take 2,000 Units by mouth daily.     . diazepam (VALIUM) 5 MG tablet Take 1 tablet (5 mg total) by mouth at bedtime as needed. 90 tablet 0  . fexofenadine (ALLEGRA) 180 MG tablet Take 180 mg by mouth at bedtime.     Marland Kitchen FLUoxetine (PROZAC) 10 MG capsule Take 1 capsule (10 mg total) by mouth daily. 90 capsule 3  . furosemide (LASIX) 40 MG tablet TAKE 1/2 TO 1 TABLET DAILY AS NEEDED FOR FLUID 90 tablet 3  . glipiZIDE (GLUCOTROL) 5 MG tablet TAKE 2 TABLETS IN AM AND 1 TAB IN PM 270 tablet 3  . labetalol (NORMODYNE) 300 MG tablet TAKE 1 TABLET TWICE DAILY. 180 tablet 3  . meloxicam (MOBIC) 7.5 MG tablet Take 1 tablet (7.5 mg total) by mouth daily. 90 tablet 1  . metFORMIN (GLUCOPHAGE) 500 MG tablet Take 1 tablet (500 mg total) by mouth 2 (two) times daily. 180 tablet 3  . ONE TOUCH ULTRA TEST test strip CHECK BLOOD SUGAR ONCE A DAY. 100 each 0  . pravastatin (PRAVACHOL) 40 MG tablet TAKE 1 TABLET DAILY. 90 tablet 3  . valsartan (DIOVAN) 320 MG tablet TAKE ONE TABLET AT BEDTIME. 90 tablet 3   . vitamin E 400 UNIT capsule Take 400 Units by mouth daily.     No current facility-administered medications on file prior to visit.    Allergies  Allergen Reactions  . Amoxicillin     REACTION: rash  . Rocephin [Ceftriaxone Sodium In  Dextrose]     10/24 /14 blisters of palms & diffuse rash Because of a history of documented adverse serious drug reaction;Medi Alert bracelet  is recommended  . Captopril     REACTION: cough  . Simvastatin     REACTION: buttocks cramps  . Adhesive [Tape] Rash   Family History  Problem Relation Age of Onset  . Heart failure Mother   . Heart disease Mother   . Hypertension Mother   . Other Mother     varicose veins  . Deep vein thrombosis Mother   . Varicose Veins Mother   . Heart attack Mother   . Hypertension Sister     X 2  . Diabetes Sister   . Hyperlipidemia Sister   . Other Sister     varicose veins  . Heart disease Father   . Diabetes Father   . Hyperlipidemia Father   . Hypertension Father   . Heart attack Father   . Heart disease Brother     MI in late 32s  . Hyperlipidemia Brother   . Heart attack Brother   . Coronary artery disease    . Diabetes    . Parkinsonism Brother   . Heart failure Brother   . Hypertension Daughter    PE: BP 138/80 (BP Location: Right Arm, Patient Position: Sitting)   Pulse 85   Ht 5' 3.5" (1.613 m)   Wt 165 lb (74.8 kg)   SpO2 96%   BMI 28.77 kg/m  Body mass index is 28.77 kg/m. Wt Readings from Last 3 Encounters:  01/20/17 165 lb (74.8 kg)  12/19/16 163 lb (73.9 kg)  12/15/16 161 lb (73 kg)   Constitutional: slightly overweight, in NAD Eyes: PERRLA, EOMI, no exophthalmos ENT: moist mucous membranes, no thyromegaly, no cervical lymphadenopathy Cardiovascular: RRR, No MRG, + mild periankle swelling Respiratory: CTA B Gastrointestinal: abdomen soft, NT, ND, BS+ Musculoskeletal: no deformities, strength intact in all 4 Skin: moist, warm Neurological: no tremor with outstretched hands,  DTR normal in all 4  ASSESSMENT: 1. DM2, non-insulin-dependent, controlled, with complications - CKD stage 3  2. OP  PLAN:  1. Patient with long-standing, uncontrolled diabetes, on oral antidiabetic regimen, prev. on Metformin 500 mg 2x a day, but we had to add Glipizide after she started Prednisone for her FSGS. She remains on this, with good results, except high sugars after dietary indiscretions. - May need intensification of tx at last visit (Januvia?), but not now - Reviewed last HbA1c, 6.6% (improved) >> today: 6.4% (better) - I suggested to:  Patient Instructions  Please continue: - Glipizide to 5 mg before b'fast and 10 mg before dinner. - Metformin 500 mg daily 2x a day  Please return in 3 months with your sugar log.   - continue checking sugars at different times of the day - check 2x a day, rotating checks - advised for yearly eye exams >> she is UTD with dilated eye exams  - will have a new one soon - UTD with flu shot - Return to clinic in 3 mo with sugar log   2. OP - we reviewed her test scores and her GFR (38) >> I suggested to d/w Dr. Posey Pronto as I am not comfortable treating her OP with such a low GFR. By the new guidelines, nephrologists are now at the forefront of tx for OP in these pts.   Philemon Kingdom, MD PhD [facility]

## 2017-01-21 ENCOUNTER — Other Ambulatory Visit: Payer: Self-pay | Admitting: Emergency Medicine

## 2017-01-21 MED ORDER — DIAZEPAM 5 MG PO TABS: 5.0000 mg | ORAL_TABLET | Freq: Every evening | ORAL | 0 refills | Status: DC | PRN

## 2017-01-21 NOTE — Telephone Encounter (Signed)
RX faxed to [facility] 

## 2017-01-27 DIAGNOSIS — N183 Chronic kidney disease, stage 3 (moderate): Secondary | ICD-10-CM | POA: Diagnosis not present

## 2017-01-27 DIAGNOSIS — I129 Hypertensive chronic kidney disease with stage 1 through stage 4 chronic kidney disease, or unspecified chronic kidney disease: Secondary | ICD-10-CM | POA: Diagnosis not present

## 2017-01-27 DIAGNOSIS — N2581 Secondary hyperparathyroidism of renal origin: Secondary | ICD-10-CM | POA: Diagnosis not present

## 2017-01-27 DIAGNOSIS — D631 Anemia in chronic kidney disease: Secondary | ICD-10-CM | POA: Diagnosis not present

## 2017-01-27 DIAGNOSIS — N051 Unspecified nephritic syndrome with focal and segmental glomerular lesions: Secondary | ICD-10-CM | POA: Diagnosis not present

## 2017-02-09 ENCOUNTER — Other Ambulatory Visit (HOSPITAL_COMMUNITY): Payer: Self-pay | Admitting: *Deleted

## 2017-02-11 NOTE — Pre-Procedure Instructions (Signed)
    CONLEIGH HEINLEIN  02/11/2017      [id number] - [place], [place] - [place] [place] [address] Phone: [phone] Fax: [place], [place] [address] Phone: [phone] Fax: [phone]    Your procedure is scheduled on Monday, April 9th   Report to [facility] Admitting at 5:30 AM             (posted surgical time 7:30 - 11:29 am)   Call this number if you have problems the MORNing of surgery:  [phone], otherwise, call [phone] Mon - Fri from 8-4:30 pm   Remember:  Do not eat food or drink liquids after midnight Sunday.   Take these medicines the morning of surgery with A SIP OF WATER : Norvasc, Valium, Labetalol.                            4-5 days prior to surgery, STOP TAKING any vitamins, herbal supplements, anti-inflammatories.              You WILL NOT take any diabetes medication the morning of surgery.   Do not wear jewelry, make-up or nail polish.  Do not wear lotions, powders, or perfumes, or deoderant.  Do not shave 48 hours prior to surgery.     Do not bring valuables to the hospital.  [facility] is not responsible for any belongings or valuables.  Contacts, dentures or bridgework may not be worn into surgery.  Leave your suitcase in the car.  After surgery it may be brought to your room. For patients admitted to the hospital, discharge time will be determined by your treatment team.   Please read over the following fact sheets that you were given. Pain Booklet, MRSA Information and Surgical Site Infection Prevention

## 2017-02-12 ENCOUNTER — Encounter (HOSPITAL_COMMUNITY): Payer: Self-pay

## 2017-02-12 ENCOUNTER — Encounter (HOSPITAL_COMMUNITY)
Admission: RE | Admit: 2017-02-12 | Discharge: 2017-02-12 | Disposition: A | Discharge status: Home / Self Care | Payer: PPO | Source: Ambulatory Visit | Attending: Thoracic Surgery (Cardiothoracic Vascular Surgery) | Admitting: Thoracic Surgery (Cardiothoracic Vascular Surgery)

## 2017-02-12 DIAGNOSIS — R911 Solitary pulmonary nodule: Secondary | ICD-10-CM

## 2017-02-12 DIAGNOSIS — Z01818 Encounter for other preprocedural examination: Secondary | ICD-10-CM | POA: Diagnosis not present

## 2017-02-12 HISTORY — DX: Gastro-esophageal reflux disease without esophagitis: K21.9

## 2017-02-12 HISTORY — DX: Malignant (primary) neoplasm, unspecified: C80.1

## 2017-02-12 HISTORY — DX: Depression, unspecified: F32.A

## 2017-02-12 HISTORY — DX: Major depressive disorder, single episode, unspecified: F32.9

## 2017-02-12 HISTORY — DX: Personal history of urinary calculi: Z87.442

## 2017-02-12 LAB — COMPREHENSIVE METABOLIC PANEL
ALBUMIN: 3.5 g/dL (ref 3.5–5.0)
ALK PHOS: 85 U/L (ref 38–126)
ALT: 16 U/L (ref 14–54)
ANION GAP: 13 (ref 5–15)
AST: 23 U/L (ref 15–41)
BUN: 18 mg/dL (ref 6–20)
CALCIUM: 9.3 mg/dL (ref 8.9–10.3)
CO2: 22 mmol/L (ref 22–32)
Chloride: 103 mmol/L (ref 101–111)
Creatinine, Ser: 1.33 mg/dL — ABNORMAL HIGH (ref 0.44–1.00)
GFR calc Af Amer: 44 mL/min — ABNORMAL LOW (ref >60)
GFR calc non Af Amer: 38 mL/min — ABNORMAL LOW (ref >60)
GLUCOSE: 145 mg/dL — AB (ref 65–99)
Potassium: 3.8 mmol/L (ref 3.5–5.1)
Sodium: 138 mmol/L (ref 135–145)
TOTAL PROTEIN: 6.9 g/dL (ref 6.5–8.1)
Total Bilirubin: 0.6 mg/dL (ref 0.3–1.2)

## 2017-02-12 LAB — BLOOD GAS, ARTERIAL
Acid-Base Excess: 0.4 mmol/L (ref 0.0–2.0)
BICARBONATE: 24.1 mmol/L (ref 20.0–28.0)
Drawn by: 470591
FIO2: 21
O2 Saturation: 93 %
PO2 ART: 64.6 mmHg — AB (ref 83.0–108.0)
Patient temperature: 98.6
pCO2 arterial: 35.9 mmHg (ref 32.0–48.0)
pH, Arterial: 7.441 (ref 7.350–7.450)

## 2017-02-12 LAB — URINALYSIS, ROUTINE W REFLEX MICROSCOPIC
BILIRUBIN URINE: NEGATIVE
GLUCOSE, UA: NEGATIVE mg/dL
HGB URINE DIPSTICK: NEGATIVE
KETONES UR: NEGATIVE mg/dL
Leukocytes, UA: NEGATIVE
NITRITE: NEGATIVE
PROTEIN: 100 mg/dL — AB
Specific Gravity, Urine: 1.012 (ref 1.005–1.030)
pH: 6 (ref 5.0–8.0)

## 2017-02-12 LAB — TYPE AND SCREEN
ABO/RH(D): O POS
Antibody Screen: NEGATIVE

## 2017-02-12 LAB — PROTIME-INR
INR: 0.95
Prothrombin Time: 12.7 seconds (ref 11.4–15.2)

## 2017-02-12 LAB — CBC
HEMATOCRIT: 36.9 % (ref 36.0–46.0)
HEMOGLOBIN: 11.9 g/dL — AB (ref 12.0–15.0)
MCH: 27.7 pg (ref 26.0–34.0)
MCHC: 32.2 g/dL (ref 30.0–36.0)
MCV: 86 fL (ref 78.0–100.0)
Platelets: 377 10*3/uL (ref 150–400)
RBC: 4.29 MIL/uL (ref 3.87–5.11)
RDW: 14.5 % (ref 11.5–15.5)
WBC: 13 10*3/uL — ABNORMAL HIGH (ref 4.0–10.5)

## 2017-02-12 LAB — GLUCOSE, CAPILLARY: GLUCOSE-CAPILLARY: 187 mg/dL — AB (ref 65–99)

## 2017-02-12 LAB — SURGICAL PCR SCREEN
MRSA, PCR: NEGATIVE
Staphylococcus aureus: NEGATIVE

## 2017-02-12 LAB — APTT: aPTT: 25 seconds (ref 24–36)

## 2017-02-12 NOTE — Progress Notes (Addendum)
PCP is Dr. Billey Gosling  LOV 11/2016 Cardio is Dr. Stanford Breed  LOV 11/2016  H/o heart murmur Nephrologist is Dr. Posey Pronto  LOV 01/2017.  Has FSGS Echo 05/2008    Stress 2014 Instructed patient to take her aspirin up until the day of surgery States her sugars run 95-145 in the mornings.  01/2017 A1C 6.4 CXR DOS States she's been having some respiratory issues lately.  No fever, just feels alittle congested, and occasionally will cough up green secretions, but not often.  Instructed her to call Ryan at the [facility] and let her know. Left message for Thurmond Butts concerning patients' labwork from today, esp. Urine & WBC, and patients concern over recent respiratory congestion.  Will leave chart for Ebony Hail or Levada Dy to review.

## 2017-02-13 ENCOUNTER — Other Ambulatory Visit: Payer: Self-pay | Admitting: *Deleted

## 2017-02-13 DIAGNOSIS — R3989 Other symptoms and signs involving the genitourinary system: Secondary | ICD-10-CM

## 2017-02-13 MED ORDER — CIPROFLOXACIN HCL 500 MG PO TABS: 500.0000 mg | ORAL_TABLET | Freq: Two times a day (BID) | ORAL | 0 refills | Status: DC

## 2017-02-13 NOTE — Progress Notes (Signed)
Anesthesia chart review: Patient is a 76 year old female scheduled for right VATS, right middle lobectomy on 02/16/2017 by Dr. Roxan Hockey.  History includes smoking, hypertension, hyperlipidemia, subclavian steal syndrome (left; medical therapy per Dr. Fletcher Anon 03/2016), diabetes mellitus type 2, AAA (4.2 cm 10/2016), murmur, GERD, depression, skin cancer, CKD (FSGS), hematuria (10/2016: RLP cystic mass benign by MRI, non-obstructing left renal stone, 12/2016 cysto unremarkable), hysterectomy, L3-5 PLIF '13.   PCP is Dr. Billey Gosling, [place] 11/19/16. Pulmonologist is Dr. Elsworth Soho, [place] 01/12/17 with Rexene Edison, NP. Cardiologist is Dr. Stanford Breed, [place] 12/15/16.   Nephrologist is Dr. Posey Pronto, [place] 01/27/17 (Media tab).   Urologist is Dr. Tresa Moore, [place] 12/23/16 (Media tab). Vascular surgeon is Dr. Donnetta Hutching, [place] 07/08/16 with Clemon Chambers, NP.  Endocrinologist is Dr. Cruzita Lederer, [place] 10/21/16.  BP (!) 134/56   Pulse 84   Temp 36.8 C   Resp 20   Ht 5\' 4"  (1.626 m)   Wt 159 lb (72.1 kg)   SpO2 95%   BMI 27.29 kg/m   Meds include amlodipine, aspirin 81 mg, Valium, R, Prozac, Lasix, glipizide, labetalol, metformin, pravastatin, Nasacort, valsartan.  EKG 02/12/17: NSR, low voltage QRS.   Echo 05/19/08: - Overall left ventricular systolic function was normal. Left ventricular ejection fraction was estimated , range being 55 % to 65 %. There were no left ventricular regional wall motion abnormalities. Left ventricular wall thickness was mildly increased. - Aortic valve thickness was mildly increased. - There was mild mitral valvular regurgitation. - Trivial tricuspid valvular regurgitation   Carotid U/S 03/07/16: Impressions: 1-39% bilateral ICA stenosis, central stable. Normal right vertebral, subclavian and brachial artery flow. 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  CTA abd/pelvis 11/08/16: IMPRESSION: VASCULAR - Stable 4.2 cm infrarenal abdominal aortic aneurysm. - Possible tiny  vascular lab in the proximal celiac artery. The origin of the right celiac artery is not identified and soft plaque is not excluded versus focal stenosis. NON-VASCULAR - 13 x 10 mm right middle lobe pulmonary nodule. Consider one of the following in 3 months for both low-risk and high-risk individuals: (a) repeat chest CT, (b) follow-up PET-CT, or (c) tissue sampling. - Nonobstructing 5 mm left renal stone with bilateral renal cysts. No obstructive uropathy. - L3 through L5 lumbar disc fusion. Degenerative disc disease above and below the fusion.  PET Scan 01/02/17: IMPRESSION: - 9 mm right middle lobe nodule with mild hypermetabolism, worrisome for early primary bronchogenic neoplasm. - No findings specific for metastatic disease. - Trace right pleural effusion. - 4.4 cm infrarenal abdominal aortic aneurysm. Recommend followup by [place] in 1 year.  PFTs 01/12/17:  FVC 2.52 (88%) FEV1 2.04 (95%) TLC 4.36 (86%) DLCO 17.78 (73%)  Preoperative labs noted. Glucose 145, creatinine 1.33, WBC 13.0, H&H 11.9 and 36.9. A1c 6.4 on 01/20/17. UA with many bacteria, negative leukocytes, negative nitrates, 0-5 RBCs, 6-30 WBCs. PAT RN notified [facility] RN Ryan of patient's UA and WBC results and that patient reported a little congestion with occasional productive cough without fever.   She will be evaluated by her surgeon and anesthesiologist on the day of surgery. She is for CXR on arrival.  [facility] Short Stay Center/Anesthesiology Phone [phone] 02/13/2017 11:23 AM

## 2017-02-15 MED ORDER — VANCOMYCIN HCL 10 G IV SOLR
1250.0000 mg | INTRAVENOUS | Status: AC
  Administered 2017-02-16: 1250 mg via INTRAVENOUS
  Filled 2017-02-15 (×2): qty 1250

## 2017-02-16 ENCOUNTER — Encounter (HOSPITAL_COMMUNITY)
Admission: RE | Disposition: A | Discharge status: Home / Self Care | Payer: Self-pay | Source: Ambulatory Visit | Attending: Thoracic Surgery (Cardiothoracic Vascular Surgery)

## 2017-02-16 ENCOUNTER — Encounter (HOSPITAL_COMMUNITY): Payer: Self-pay | Admitting: *Deleted

## 2017-02-16 ENCOUNTER — Inpatient Hospital Stay (HOSPITAL_COMMUNITY): Payer: PPO

## 2017-02-16 ENCOUNTER — Inpatient Hospital Stay (HOSPITAL_COMMUNITY): Payer: PPO | Admitting: Vascular Surgery

## 2017-02-16 ENCOUNTER — Inpatient Hospital Stay (HOSPITAL_COMMUNITY)
Admission: RE | Admit: 2017-02-16 | Discharge: 2017-02-19 | DRG: 829 | Disposition: A | Discharge status: Home / Self Care | Payer: PPO | Source: Ambulatory Visit | Attending: Thoracic Surgery (Cardiothoracic Vascular Surgery) | Admitting: Thoracic Surgery (Cardiothoracic Vascular Surgery)

## 2017-02-16 ENCOUNTER — Inpatient Hospital Stay (HOSPITAL_COMMUNITY): Payer: PPO | Admitting: Certified Registered Nurse Anesthetist

## 2017-02-16 DIAGNOSIS — Z902 Acquired absence of lung [part of]: Secondary | ICD-10-CM

## 2017-02-16 DIAGNOSIS — E785 Hyperlipidemia, unspecified: Secondary | ICD-10-CM | POA: Diagnosis present

## 2017-02-16 DIAGNOSIS — J189 Pneumonia, unspecified organism: Secondary | ICD-10-CM | POA: Diagnosis not present

## 2017-02-16 DIAGNOSIS — E119 Type 2 diabetes mellitus without complications: Secondary | ICD-10-CM | POA: Diagnosis not present

## 2017-02-16 DIAGNOSIS — F17290 Nicotine dependence, other tobacco product, uncomplicated: Secondary | ICD-10-CM | POA: Diagnosis not present

## 2017-02-16 DIAGNOSIS — Z8249 Family history of ischemic heart disease and other diseases of the circulatory system: Secondary | ICD-10-CM | POA: Diagnosis not present

## 2017-02-16 DIAGNOSIS — J9811 Atelectasis: Secondary | ICD-10-CM | POA: Diagnosis not present

## 2017-02-16 DIAGNOSIS — Z881 Allergy status to other antibiotic agents status: Secondary | ICD-10-CM

## 2017-02-16 DIAGNOSIS — Z9842 Cataract extraction status, left eye: Secondary | ICD-10-CM | POA: Diagnosis not present

## 2017-02-16 DIAGNOSIS — Q859 Phakomatosis, unspecified: Principal | ICD-10-CM

## 2017-02-16 DIAGNOSIS — I700 Atherosclerosis of aorta: Secondary | ICD-10-CM | POA: Diagnosis not present

## 2017-02-16 DIAGNOSIS — Z820 Family history of epilepsy and other diseases of the nervous system: Secondary | ICD-10-CM

## 2017-02-16 DIAGNOSIS — D1431 Benign neoplasm of right bronchus and lung: Secondary | ICD-10-CM | POA: Diagnosis not present

## 2017-02-16 DIAGNOSIS — D649 Anemia, unspecified: Secondary | ICD-10-CM | POA: Diagnosis present

## 2017-02-16 DIAGNOSIS — N390 Urinary tract infection, site not specified: Secondary | ICD-10-CM | POA: Diagnosis present

## 2017-02-16 DIAGNOSIS — Z09 Encounter for follow-up examination after completed treatment for conditions other than malignant neoplasm: Secondary | ICD-10-CM

## 2017-02-16 DIAGNOSIS — D62 Acute posthemorrhagic anemia: Secondary | ICD-10-CM | POA: Diagnosis not present

## 2017-02-16 DIAGNOSIS — G458 Other transient cerebral ischemic attacks and related syndromes: Secondary | ICD-10-CM | POA: Diagnosis present

## 2017-02-16 DIAGNOSIS — Z90710 Acquired absence of both cervix and uterus: Secondary | ICD-10-CM

## 2017-02-16 DIAGNOSIS — F418 Other specified anxiety disorders: Secondary | ICD-10-CM | POA: Diagnosis not present

## 2017-02-16 DIAGNOSIS — Z833 Family history of diabetes mellitus: Secondary | ICD-10-CM

## 2017-02-16 DIAGNOSIS — N189 Chronic kidney disease, unspecified: Secondary | ICD-10-CM | POA: Diagnosis not present

## 2017-02-16 DIAGNOSIS — I714 Abdominal aortic aneurysm, without rupture: Secondary | ICD-10-CM | POA: Diagnosis present

## 2017-02-16 DIAGNOSIS — Z791 Long term (current) use of non-steroidal anti-inflammatories (NSAID): Secondary | ICD-10-CM | POA: Diagnosis not present

## 2017-02-16 DIAGNOSIS — Z888 Allergy status to other drugs, medicaments and biological substances status: Secondary | ICD-10-CM | POA: Diagnosis not present

## 2017-02-16 DIAGNOSIS — Z7982 Long term (current) use of aspirin: Secondary | ICD-10-CM

## 2017-02-16 DIAGNOSIS — I10 Essential (primary) hypertension: Secondary | ICD-10-CM | POA: Diagnosis not present

## 2017-02-16 DIAGNOSIS — Z4682 Encounter for fitting and adjustment of non-vascular catheter: Secondary | ICD-10-CM | POA: Diagnosis not present

## 2017-02-16 DIAGNOSIS — Z7984 Long term (current) use of oral hypoglycemic drugs: Secondary | ICD-10-CM

## 2017-02-16 DIAGNOSIS — J90 Pleural effusion, not elsewhere classified: Secondary | ICD-10-CM | POA: Diagnosis not present

## 2017-02-16 DIAGNOSIS — Z01818 Encounter for other preprocedural examination: Secondary | ICD-10-CM

## 2017-02-16 DIAGNOSIS — R911 Solitary pulmonary nodule: Secondary | ICD-10-CM

## 2017-02-16 DIAGNOSIS — I129 Hypertensive chronic kidney disease with stage 1 through stage 4 chronic kidney disease, or unspecified chronic kidney disease: Secondary | ICD-10-CM | POA: Diagnosis not present

## 2017-02-16 HISTORY — PX: VIDEO ASSISTED THORACOSCOPY (VATS)/ LOBECTOMY: SHX6169

## 2017-02-16 LAB — GLUCOSE, CAPILLARY
GLUCOSE-CAPILLARY: 175 mg/dL — AB (ref 65–99)
Glucose-Capillary: 147 mg/dL — ABNORMAL HIGH (ref 65–99)
Glucose-Capillary: 249 mg/dL — ABNORMAL HIGH (ref 65–99)
Glucose-Capillary: 191 mg/dL — ABNORMAL HIGH (ref 65–99)

## 2017-02-16 SURGERY — VIDEO ASSISTED THORACOSCOPY (VATS)/ LOBECTOMY
Anesthesia: General | Site: Chest | Laterality: Right

## 2017-02-16 MED ORDER — AMLODIPINE BESYLATE 5 MG PO TABS
5.0000 mg | ORAL_TABLET | Freq: Two times a day (BID) | ORAL | Status: DC
  Administered 2017-02-17 – 2017-02-19 (×5): 5 mg via ORAL
  Filled 2017-02-16 (×5): qty 1

## 2017-02-16 MED ORDER — FENTANYL 40 MCG/ML IV SOLN
INTRAVENOUS | Status: AC
  Administered 2017-02-16: 0 ug via INTRAVENOUS
  Administered 2017-02-16: 1000 ug via INTRAVENOUS
  Administered 2017-02-16: 30 ug via INTRAVENOUS
  Administered 2017-02-17: 10 ug via INTRAVENOUS
  Administered 2017-02-17 (×2): 20 ug via INTRAVENOUS
  Administered 2017-02-17 (×2): 0 ug via INTRAVENOUS
  Administered 2017-02-18: 20 ug via INTRAVENOUS
  Administered 2017-02-18: 0 ug via INTRAVENOUS
  Administered 2017-02-18: 10 ug via INTRAVENOUS
  Administered 2017-02-18: 30 ug via INTRAVENOUS
  Filled 2017-02-16: qty 25

## 2017-02-16 MED ORDER — LIDOCAINE 2% (20 MG/ML) 5 ML SYRINGE
INTRAMUSCULAR | Status: AC
  Filled 2017-02-16: qty 5

## 2017-02-16 MED ORDER — TRAMADOL HCL 50 MG PO TABS: 50.0000 mg | ORAL_TABLET | Freq: Four times a day (QID) | ORAL | Status: DC | PRN

## 2017-02-16 MED ORDER — TRIAMCINOLONE ACETONIDE 55 MCG/ACT NA AERO: 2.0000 | INHALATION_SPRAY | Freq: Two times a day (BID) | NASAL | Status: DC | PRN

## 2017-02-16 MED ORDER — DEXAMETHASONE SODIUM PHOSPHATE 10 MG/ML IJ SOLN
INTRAMUSCULAR | Status: AC
  Filled 2017-02-16: qty 1

## 2017-02-16 MED ORDER — FENTANYL CITRATE (PF) 250 MCG/5ML IJ SOLN
INTRAMUSCULAR | Status: AC
  Filled 2017-02-16: qty 5

## 2017-02-16 MED ORDER — ACETAMINOPHEN 160 MG/5ML PO SOLN: 1000.0000 mg | Freq: Four times a day (QID) | ORAL | Status: DC

## 2017-02-16 MED ORDER — OXYCODONE HCL 5 MG PO TABS: 5.0000 mg | ORAL_TABLET | Freq: Once | ORAL | Status: DC | PRN

## 2017-02-16 MED ORDER — ONDANSETRON HCL 4 MG/2ML IJ SOLN
INTRAMUSCULAR | Status: AC
  Filled 2017-02-16: qty 2

## 2017-02-16 MED ORDER — BUPIVACAINE 0.5 % ON-Q PUMP SINGLE CATH 400 ML
400.0000 mL | INJECTION | Status: DC
  Filled 2017-02-16: qty 400

## 2017-02-16 MED ORDER — OXYCODONE HCL 5 MG PO TABS
5.0000 mg | ORAL_TABLET | ORAL | Status: DC | PRN
  Administered 2017-02-19: 5 mg via ORAL
  Filled 2017-02-16: qty 2

## 2017-02-16 MED ORDER — ACETAMINOPHEN 500 MG PO TABS
1000.0000 mg | ORAL_TABLET | Freq: Four times a day (QID) | ORAL | Status: DC
  Administered 2017-02-16 – 2017-02-19 (×12): 1000 mg via ORAL
  Filled 2017-02-16 (×12): qty 2

## 2017-02-16 MED ORDER — SODIUM CHLORIDE 0.9 % IV SOLN
30.0000 meq | Freq: Every day | INTRAVENOUS | Status: DC | PRN
  Administered 2017-02-17: 30 meq via INTRAVENOUS
  Filled 2017-02-16 (×2): qty 15

## 2017-02-16 MED ORDER — PRAVASTATIN SODIUM 40 MG PO TABS
40.0000 mg | ORAL_TABLET | Freq: Every day | ORAL | Status: DC
  Administered 2017-02-17 – 2017-02-18 (×2): 40 mg via ORAL
  Filled 2017-02-16 (×2): qty 1

## 2017-02-16 MED ORDER — SUGAMMADEX SODIUM 200 MG/2ML IV SOLN
INTRAVENOUS | Status: DC | PRN
  Administered 2017-02-16: 200 mg via INTRAVENOUS

## 2017-02-16 MED ORDER — IRBESARTAN 300 MG PO TABS
300.0000 mg | ORAL_TABLET | Freq: Every day | ORAL | Status: DC
  Administered 2017-02-17 – 2017-02-19 (×3): 300 mg via ORAL
  Filled 2017-02-16 (×3): qty 1

## 2017-02-16 MED ORDER — ALBUTEROL SULFATE (2.5 MG/3ML) 0.083% IN NEBU
2.5000 mg | INHALATION_SOLUTION | RESPIRATORY_TRACT | Status: DC
  Administered 2017-02-16 (×2): 2.5 mg via RESPIRATORY_TRACT
  Filled 2017-02-16 (×2): qty 3

## 2017-02-16 MED ORDER — HEMOSTATIC AGENTS (NO CHARGE) OPTIME
TOPICAL | Status: DC | PRN
  Administered 2017-02-16: 1 via TOPICAL

## 2017-02-16 MED ORDER — ONDANSETRON HCL 4 MG/2ML IJ SOLN
4.0000 mg | Freq: Four times a day (QID) | INTRAMUSCULAR | Status: DC | PRN
  Filled 2017-02-16: qty 2

## 2017-02-16 MED ORDER — NALOXONE HCL 0.4 MG/ML IJ SOLN
0.4000 mg | INTRAMUSCULAR | Status: DC | PRN
  Filled 2017-02-16: qty 1

## 2017-02-16 MED ORDER — LIDOCAINE HCL (CARDIAC) 20 MG/ML IV SOLN
INTRAVENOUS | Status: DC | PRN
Start: 2017-02-16 — End: 2017-02-16
  Administered 2017-02-16: 50 mg via INTRAVENOUS

## 2017-02-16 MED ORDER — DEXTROSE-NACL 5-0.9 % IV SOLN
INTRAVENOUS | Status: DC
  Administered 2017-02-16 – 2017-02-17 (×2): via INTRAVENOUS

## 2017-02-16 MED ORDER — INSULIN ASPART 100 UNIT/ML ~~LOC~~ SOLN
0.0000 [IU] | Freq: Four times a day (QID) | SUBCUTANEOUS | Status: DC
  Administered 2017-02-16: 2 [IU] via SUBCUTANEOUS
  Administered 2017-02-16: 8 [IU] via SUBCUTANEOUS
  Administered 2017-02-17 (×3): 4 [IU] via SUBCUTANEOUS

## 2017-02-16 MED ORDER — FLUOXETINE HCL 10 MG PO CAPS
10.0000 mg | ORAL_CAPSULE | Freq: Every day | ORAL | Status: DC
  Administered 2017-02-16 – 2017-02-18 (×3): 10 mg via ORAL
  Filled 2017-02-16 (×4): qty 1

## 2017-02-16 MED ORDER — ROCURONIUM BROMIDE 100 MG/10ML IV SOLN
INTRAVENOUS | Status: DC | PRN
  Administered 2017-02-16: 50 mg via INTRAVENOUS

## 2017-02-16 MED ORDER — ROCURONIUM BROMIDE 50 MG/5ML IV SOSY
PREFILLED_SYRINGE | INTRAVENOUS | Status: AC
  Filled 2017-02-16: qty 5

## 2017-02-16 MED ORDER — DIPHENHYDRAMINE HCL 12.5 MG/5ML PO ELIX
12.5000 mg | ORAL_SOLUTION | Freq: Four times a day (QID) | ORAL | Status: DC | PRN
  Filled 2017-02-16: qty 5

## 2017-02-16 MED ORDER — DIPHENHYDRAMINE HCL 50 MG/ML IJ SOLN
12.5000 mg | Freq: Four times a day (QID) | INTRAMUSCULAR | Status: DC | PRN
  Filled 2017-02-16: qty 0.25

## 2017-02-16 MED ORDER — OXYCODONE HCL 5 MG/5ML PO SOLN: 5.0000 mg | Freq: Once | ORAL | Status: DC | PRN

## 2017-02-16 MED ORDER — BUPIVACAINE HCL (PF) 0.5 % IJ SOLN
INTRAMUSCULAR | Status: AC
  Filled 2017-02-16: qty 30

## 2017-02-16 MED ORDER — VECURONIUM BROMIDE 10 MG IV SOLR
INTRAVENOUS | Status: DC | PRN
Start: 2017-02-16 — End: 2017-02-16
  Administered 2017-02-16: 2 mg via INTRAVENOUS

## 2017-02-16 MED ORDER — SENNOSIDES-DOCUSATE SODIUM 8.6-50 MG PO TABS
1.0000 | ORAL_TABLET | Freq: Every day | ORAL | Status: DC
  Administered 2017-02-16 – 2017-02-18 (×3): 1 via ORAL
  Filled 2017-02-16 (×4): qty 1

## 2017-02-16 MED ORDER — ASPIRIN EC 81 MG PO TBEC
81.0000 mg | DELAYED_RELEASE_TABLET | Freq: Every day | ORAL | Status: DC
  Administered 2017-02-17 – 2017-02-19 (×3): 81 mg via ORAL
  Filled 2017-02-16 (×3): qty 1

## 2017-02-16 MED ORDER — LACTATED RINGERS IV SOLN
INTRAVENOUS | Status: DC | PRN
  Administered 2017-02-16: 07:00:00 via INTRAVENOUS

## 2017-02-16 MED ORDER — BISACODYL 5 MG PO TBEC
10.0000 mg | DELAYED_RELEASE_TABLET | Freq: Every day | ORAL | Status: DC
  Administered 2017-02-17 – 2017-02-18 (×2): 10 mg via ORAL
  Filled 2017-02-16 (×3): qty 2

## 2017-02-16 MED ORDER — SODIUM CHLORIDE 0.9 % IV SOLN
INTRAVENOUS | Status: DC | PRN
  Administered 2017-02-16: 08:00:00 via INTRAVENOUS

## 2017-02-16 MED ORDER — ONDANSETRON HCL 4 MG/2ML IJ SOLN: 4.0000 mg | Freq: Four times a day (QID) | INTRAMUSCULAR | Status: DC | PRN

## 2017-02-16 MED ORDER — FENTANYL CITRATE (PF) 100 MCG/2ML IJ SOLN
INTRAMUSCULAR | Status: DC | PRN
  Administered 2017-02-16 (×3): 50 ug via INTRAVENOUS
  Administered 2017-02-16 (×2): 100 ug via INTRAVENOUS
  Administered 2017-02-16: 50 ug via INTRAVENOUS
  Administered 2017-02-16: 100 ug via INTRAVENOUS

## 2017-02-16 MED ORDER — ALBUTEROL SULFATE (2.5 MG/3ML) 0.083% IN NEBU
2.5000 mg | INHALATION_SOLUTION | Freq: Four times a day (QID) | RESPIRATORY_TRACT | Status: DC
  Filled 2017-02-16: qty 3

## 2017-02-16 MED ORDER — VECURONIUM BROMIDE 10 MG IV SOLR
INTRAVENOUS | Status: AC
  Filled 2017-02-16: qty 10

## 2017-02-16 MED ORDER — MIDAZOLAM HCL 5 MG/5ML IJ SOLN
INTRAMUSCULAR | Status: DC | PRN
  Administered 2017-02-16 (×2): 1 mg via INTRAVENOUS

## 2017-02-16 MED ORDER — BUPIVACAINE HCL (PF) 0.5 % IJ SOLN
INTRAMUSCULAR | Status: DC | PRN
  Administered 2017-02-16: 5 mL

## 2017-02-16 MED ORDER — ONDANSETRON HCL 4 MG/2ML IJ SOLN
INTRAMUSCULAR | Status: DC | PRN
  Administered 2017-02-16 (×2): 4 mg via INTRAVENOUS

## 2017-02-16 MED ORDER — ORAL CARE MOUTH RINSE
15.0000 mL | Freq: Two times a day (BID) | OROMUCOSAL | Status: DC
  Administered 2017-02-16 – 2017-02-19 (×3): 15 mL via OROMUCOSAL

## 2017-02-16 MED ORDER — PANTOPRAZOLE SODIUM 40 MG PO TBEC
40.0000 mg | DELAYED_RELEASE_TABLET | Freq: Every day | ORAL | Status: DC
  Administered 2017-02-17 – 2017-02-19 (×3): 40 mg via ORAL
  Filled 2017-02-16 (×3): qty 1

## 2017-02-16 MED ORDER — SODIUM CHLORIDE 0.9% FLUSH: 9.0000 mL | INTRAVENOUS | Status: DC | PRN

## 2017-02-16 MED ORDER — DEXAMETHASONE SODIUM PHOSPHATE 10 MG/ML IJ SOLN
INTRAMUSCULAR | Status: DC | PRN
  Administered 2017-02-16: 10 mg via INTRAVENOUS

## 2017-02-16 MED ORDER — LABETALOL HCL 5 MG/ML IV SOLN
INTRAVENOUS | Status: DC | PRN
  Administered 2017-02-16: 5 mg via INTRAVENOUS
  Administered 2017-02-16: 10 mg via INTRAVENOUS

## 2017-02-16 MED ORDER — HYDROMORPHONE HCL 1 MG/ML IJ SOLN: 0.2500 mg | INTRAMUSCULAR | Status: DC | PRN

## 2017-02-16 MED ORDER — LABETALOL HCL 300 MG PO TABS
300.0000 mg | ORAL_TABLET | Freq: Two times a day (BID) | ORAL | Status: DC
  Administered 2017-02-17 – 2017-02-19 (×5): 300 mg via ORAL
  Filled 2017-02-16 (×5): qty 1

## 2017-02-16 MED ORDER — VANCOMYCIN HCL IN DEXTROSE 1-5 GM/200ML-% IV SOLN
1000.0000 mg | Freq: Two times a day (BID) | INTRAVENOUS | Status: AC
  Administered 2017-02-16: 1000 mg via INTRAVENOUS
  Filled 2017-02-16: qty 200

## 2017-02-16 MED ORDER — SUGAMMADEX SODIUM 200 MG/2ML IV SOLN
INTRAVENOUS | Status: AC
  Filled 2017-02-16: qty 2

## 2017-02-16 MED ORDER — ALBUMIN HUMAN 5 % IV SOLN
INTRAVENOUS | Status: DC | PRN
  Administered 2017-02-16: 09:00:00 via INTRAVENOUS

## 2017-02-16 MED ORDER — ARTIFICIAL TEARS OP OINT
TOPICAL_OINTMENT | OPHTHALMIC | Status: AC
  Filled 2017-02-16: qty 3.5

## 2017-02-16 MED ORDER — LORATADINE 10 MG PO TABS
10.0000 mg | ORAL_TABLET | Freq: Every day | ORAL | Status: DC
  Administered 2017-02-17 – 2017-02-19 (×3): 10 mg via ORAL
  Filled 2017-02-16 (×3): qty 1

## 2017-02-16 MED ORDER — MIDAZOLAM HCL 2 MG/2ML IJ SOLN
INTRAMUSCULAR | Status: AC
  Filled 2017-02-16: qty 2

## 2017-02-16 MED ORDER — DEXTROSE 5 % IV SOLN
INTRAVENOUS | Status: DC | PRN
  Administered 2017-02-16: 25 ug/min via INTRAVENOUS

## 2017-02-16 MED ORDER — PROPOFOL 10 MG/ML IV BOLUS
INTRAVENOUS | Status: DC | PRN
  Administered 2017-02-16: 130 mg via INTRAVENOUS
  Administered 2017-02-16: 70 mg via INTRAVENOUS

## 2017-02-16 MED ORDER — 0.9 % SODIUM CHLORIDE (POUR BTL) OPTIME
TOPICAL | Status: DC | PRN
  Administered 2017-02-16: 3000 mL

## 2017-02-16 SURGICAL SUPPLY — 99 items
ADH SKN CLS APL DERMABOND .7 (GAUZE/BANDAGES/DRESSINGS) ×1
APPLICATOR COTTON TIP 6IN STRL (MISCELLANEOUS) ×2 IMPLANT
APPLIER CLIP ROT 10 11.4 M/L (STAPLE)
APR CLP MED LRG 11.4X10 (STAPLE)
BAG SPEC RTRVL LRG 6X4 10 (ENDOMECHANICALS)
CANISTER SUCT 3000ML PPV (MISCELLANEOUS) ×3 IMPLANT
CATH KIT ON Q 5IN SLV (PAIN MANAGEMENT) ×2 IMPLANT
CATH THORACIC 28FR (CATHETERS) IMPLANT
CATH THORACIC 28FR RT ANG (CATHETERS) IMPLANT
CATH THORACIC 36FR (CATHETERS) IMPLANT
CATH THORACIC 36FR RT ANG (CATHETERS) IMPLANT
CLIP APPLIE ROT 10 11.4 M/L (STAPLE) IMPLANT
CLIP TI MEDIUM 24 (CLIP) ×2 IMPLANT
CLIP TI MEDIUM 6 (CLIP) ×1 IMPLANT
CONN ST 1/4X3/8  BEN (MISCELLANEOUS)
CONN ST 1/4X3/8 BEN (MISCELLANEOUS) IMPLANT
CONN Y 3/8X3/8X3/8  BEN (MISCELLANEOUS)
CONN Y 3/8X3/8X3/8 BEN (MISCELLANEOUS) IMPLANT
CONT SPEC 4OZ CLIKSEAL STRL BL (MISCELLANEOUS) ×18 IMPLANT
COVER SURGICAL LIGHT HANDLE (MISCELLANEOUS) IMPLANT
DERMABOND ADVANCED (GAUZE/BANDAGES/DRESSINGS) ×2
DERMABOND ADVANCED .7 DNX12 (GAUZE/BANDAGES/DRESSINGS) IMPLANT
DRAIN CHANNEL 28F RND 3/8 FF (WOUND CARE) IMPLANT
DRAIN CHANNEL 32F RND 10.7 FF (WOUND CARE) IMPLANT
DRAPE LAPAROSCOPIC ABDOMINAL (DRAPES) ×3 IMPLANT
DRAPE SLUSH/WARMER DISC (DRAPES) ×2 IMPLANT
DRAPE WARM FLUID 44X44 (DRAPE) ×1 IMPLANT
ELECT BLADE 6.5 EXT (BLADE) ×3 IMPLANT
ELECT REM PT RETURN 9FT ADLT (ELECTROSURGICAL) ×3
ELECTRODE REM PT RTRN 9FT ADLT (ELECTROSURGICAL) ×1 IMPLANT
GAUZE SPONGE 4X4 12PLY STRL (GAUZE/BANDAGES/DRESSINGS) ×3 IMPLANT
GLOVE SURG SIGNA 7.5 PF LTX (GLOVE) ×2 IMPLANT
GLOVE SURG SS PI 7.5 STRL IVOR (GLOVE) ×4 IMPLANT
GOWN STRL REUS W/ TWL LRG LVL3 (GOWN DISPOSABLE) ×2 IMPLANT
GOWN STRL REUS W/ TWL XL LVL3 (GOWN DISPOSABLE) ×1 IMPLANT
GOWN STRL REUS W/TWL LRG LVL3 (GOWN DISPOSABLE) ×9
GOWN STRL REUS W/TWL XL LVL3 (GOWN DISPOSABLE) ×3
HEMOSTAT SURGICEL 2X14 (HEMOSTASIS) ×2 IMPLANT
KIT BASIN OR (CUSTOM PROCEDURE TRAY) ×3 IMPLANT
KIT ROOM TURNOVER OR (KITS) ×3 IMPLANT
KIT SUCTION CATH 14FR (SUCTIONS) IMPLANT
MARKER SKIN DUAL TIP RULER LAB (MISCELLANEOUS) ×2 IMPLANT
NS IRRIG 1000ML POUR BTL (IV SOLUTION) ×7 IMPLANT
PACK CHEST (CUSTOM PROCEDURE TRAY) ×3 IMPLANT
PAD ARMBOARD 7.5X6 YLW CONV (MISCELLANEOUS) ×6 IMPLANT
PEN SKIN MARKING BROAD (MISCELLANEOUS) ×2 IMPLANT
POUCH ENDO CATCH II 15MM (MISCELLANEOUS) IMPLANT
POUCH SPECIMEN RETRIEVAL 10MM (ENDOMECHANICALS) IMPLANT
RELOAD STAPLE 35X2.5 WHT THIN (STAPLE) IMPLANT
RELOAD STAPLE 45 4.1 GRN THCK (STAPLE) IMPLANT
RELOAD STAPLE 45 GOLD REG/THCK (STAPLE) IMPLANT
SCISSORS ENDO CVD 5DCS (MISCELLANEOUS) IMPLANT
SEALANT PROGEL (MISCELLANEOUS) IMPLANT
SEALANT SURG COSEAL 4ML (VASCULAR PRODUCTS) IMPLANT
SEALANT SURG COSEAL 8ML (VASCULAR PRODUCTS) IMPLANT
SHEARS HARMONIC ACE PLUS 36CM (ENDOMECHANICALS) IMPLANT
SHEARS HARMONIC HDI 36CM (ELECTROSURGICAL) ×2 IMPLANT
SOLUTION ANTI FOG 6CC (MISCELLANEOUS) ×3 IMPLANT
SPECIMEN JAR MEDIUM (MISCELLANEOUS) IMPLANT
SPONGE INTESTINAL PEANUT (DISPOSABLE) ×6 IMPLANT
SPONGE TONSIL 1 RF SGL (DISPOSABLE) ×3 IMPLANT
STAPLE RELOAD 2.5MM WHITE (STAPLE) ×15 IMPLANT
STAPLE RELOAD 45 GRN (STAPLE) ×1 IMPLANT
STAPLE RELOAD 45MM GOLD (STAPLE) ×18 IMPLANT
STAPLE RELOAD 45MM GREEN (STAPLE) ×2
STAPLER ECHELON POWERED (MISCELLANEOUS) ×2 IMPLANT
STAPLER VASCULAR ECHELON 35 (CUTTER) ×2 IMPLANT
SUT PROLENE 4 0 RB 1 (SUTURE)
SUT PROLENE 4-0 RB1 .5 CRCL 36 (SUTURE) IMPLANT
SUT SILK  1 MH (SUTURE) ×2
SUT SILK 1 MH (SUTURE) ×2 IMPLANT
SUT SILK 1 TIES 10X30 (SUTURE) ×1 IMPLANT
SUT SILK 2 0 SH (SUTURE) IMPLANT
SUT SILK 2 0SH CR/8 30 (SUTURE) IMPLANT
SUT SILK 3 0 SH 30 (SUTURE) IMPLANT
SUT SILK 3 0SH CR/8 30 (SUTURE) ×2 IMPLANT
SUT VIC AB 1 CTX 36 (SUTURE) ×3
SUT VIC AB 1 CTX36XBRD ANBCTR (SUTURE) ×1 IMPLANT
SUT VIC AB 2-0 CTX 36 (SUTURE) ×3 IMPLANT
SUT VIC AB 2-0 UR6 27 (SUTURE) IMPLANT
SUT VIC AB 3-0 MH 27 (SUTURE) IMPLANT
SUT VIC AB 3-0 X1 27 (SUTURE) ×3 IMPLANT
SUT VICRYL 2 TP 1 (SUTURE) IMPLANT
SWAB COLLECTION DEVICE MRSA (MISCELLANEOUS) IMPLANT
SWAB CULTURE ESWAB REG 1ML (MISCELLANEOUS) IMPLANT
SYR 10ML LL (SYRINGE) ×3 IMPLANT
SYSTEM SAHARA CHEST DRAIN ATS (WOUND CARE) ×3 IMPLANT
TAPE CLOTH 4X10 WHT NS (GAUZE/BANDAGES/DRESSINGS) ×3 IMPLANT
TIP APPLICATOR SPRAY EXTEND 16 (VASCULAR PRODUCTS) IMPLANT
TOWEL GREEN STERILE (TOWEL DISPOSABLE) ×12 IMPLANT
TOWEL GREEN STERILE FF (TOWEL DISPOSABLE) ×6 IMPLANT
TOWEL OR 17X24 6PK STRL BLUE (TOWEL DISPOSABLE) ×3 IMPLANT
TOWEL OR 17X26 10 PK STRL BLUE (TOWEL DISPOSABLE) ×3 IMPLANT
TRAP SPECIMEN MUCOUS 40CC (MISCELLANEOUS) IMPLANT
TRAY FOLEY CATH SILVER 16FR LF (SET/KITS/TRAYS/PACK) ×2 IMPLANT
TRAY FOLEY W/METER SILVER 16FR (SET/KITS/TRAYS/PACK) ×1 IMPLANT
TROCAR XCEL BLADELESS 5X75MML (TROCAR) ×3 IMPLANT
TUNNELER SHEATH ON-Q 11GX8 DSP (PAIN MANAGEMENT) ×2 IMPLANT
WATER STERILE IRR 1000ML POUR (IV SOLUTION) ×3 IMPLANT

## 2017-02-16 NOTE — Anesthesia Preprocedure Evaluation (Signed)
Anesthesia Evaluation  Patient identified by MRN, date of birth, ID band Patient awake    Reviewed: Allergy & Precautions, NPO status , Patient's Chart, lab work & pertinent test results  History of Anesthesia Complications Negative for: history of anesthetic complications  Airway Mallampati: III  TM Distance: >3 FB Neck ROM: Full    Dental  (+) Teeth Intact   Pulmonary COPD, Current Smoker,    breath sounds clear to auscultation       Cardiovascular hypertension, Pt. on medications + Peripheral Vascular Disease   Rhythm:Regular     Neuro/Psych PSYCHIATRIC DISORDERS Anxiety Depression  Neuromuscular disease    GI/Hepatic Neg liver ROS, GERD  ,  Endo/Other  diabetes  Renal/GU Renal InsufficiencyRenal disease     Musculoskeletal  (+) Arthritis ,   Abdominal   Peds  Hematology   Anesthesia Other Findings   Reproductive/Obstetrics                             Anesthesia Physical Anesthesia Plan  ASA: III  Anesthesia Plan:    Post-op Pain Management:    Induction: Intravenous  Airway Management Planned: Double Lumen EBT  Additional Equipment: Arterial line  Intra-op Plan:   Post-operative Plan: Extubation in OR  Informed Consent: I have reviewed the patients History and Physical, chart, labs and discussed the procedure including the risks, benefits and alternatives for the proposed anesthesia with the patient or authorized representative who has indicated his/her understanding and acceptance.   Dental advisory given  Plan Discussed with: CRNA and Surgeon  Anesthesia Plan Comments:         Anesthesia Quick Evaluation

## 2017-02-16 NOTE — Brief Op Note (Signed)
02/16/2017  10:41 AM  PATIENT:  Sheila Melton  76 y.o. female  PRE-OPERATIVE DIAGNOSIS:  right middle lobe nodule  POST-OPERATIVE DIAGNOSIS:  right middle lobe nodule  PROCEDURE:  Procedure(s): Right VIDEO ASSISTED THORACOSCOPY (VATS)/ Right Middle LOBECTOMY (Right)  FROZEN:HAMARTOMA  SURGEON:  Surgeon(s) and Role:    * [name], MD - Primary  PHYSICIAN ASSISTANT: Cecily Lawhorne PA-C  ANESTHESIA:   general  EBL:  Total I/O In: 2000 [I.V.:1750; IV Piggyback:250] Out: 1000 [Urine:800; Blood:200]  BLOOD ADMINISTERED:none  DRAINS: 1 Chest Tube(s) in the RIGHT HEMITHORAX   LOCAL MEDICATIONS USED:  MARCAINE     SPECIMEN:  Source of Specimen:  RML, LN SAMPLING  DISPOSITION OF SPECIMEN:  PATHOLOGY  COUNTS:  YES  TOURNIQUET:  * No tourniquets in log *  DICTATION: .Other Dictation: Dictation Number PENDING  PLAN OF CARE: Admit to inpatient   PATIENT DISPOSITION:  PACU - hemodynamically stable.   Delay start of Pharmacological VTE agent (>24hrs) due to surgical blood loss or risk of bleeding: yes  COMPLICATIONS: NO KNOWN

## 2017-02-16 NOTE — Op Note (Signed)
Sheila Melton, Sheila Melton NO.:  [id number]  MEDICAL RECORD NO.:  [id number]  LOCATION:                                 FACILITY:  PHYSICIAN:  Revonda Standard. Roxan Hockey, M.D.DATE OF BIRTH:  1941/04/18  DATE OF PROCEDURE:  02/16/2017 DATE OF DISCHARGE:                              OPERATIVE REPORT   PREOPERATIVE DIAGNOSIS:  Right middle lobe nodule.  POSTOPERATIVE DIAGNOSIS:  Hamartoma, right middle lobe.  PROCEDURE:  Right video-assisted thoracoscopy, right middle lobectomy, on-Q local anesthetic catheter placement.  SURGEON:  Revonda Standard. Roxan Hockey, M.D.  ASSISTANT:  John Giovanni, P.A.-C.  ANESTHESIA:  General.  FINDINGS:  Mass palpable in midportion of right middle lobe, not amenable to wedge resection.  Frozen section revealed hamartoma.  CLINICAL NOTE:  Sheila Melton is a 76 year old woman with a history of tobacco abuse who was incidentally found to have a right middle lobe nodule when she had a CT of the abdomen and pelvis.  This was 10 x 13 mm.  The nodule was mildly hypermetabolic with an SUV of 1.5.  She was offered the options of surgery for resection or radiographic followup. She strongly wished to have the nodule removed and treated definitively. Due to its central location in the middle lobe, it was not felt amenable to wedge resection, but because of the small size of the middle lobe, she would tolerate a right middle lobectomy easily.  She wished to proceed with right VATS and right middle lobectomy.  The indications, risks, benefits, and alternatives were discussed in detail with her and she accepted the risks.  OPERATIVE NOTE:  Sheila Melton was brought to the preoperative holding area on February 16, 2017.  Anesthesia established intravenous access and placed an arterial blood pressure monitoring line.  Intravenous antibiotics were administered.  She was taken to the operating room, anesthetized, and intubated with a double-lumen endotracheal tube.  A  Foley catheter was placed.  Sequential compression devices were placed on the calves for DVT prophylaxis.  She was placed in a left lateral decubitus position, and the right chest was prepped and draped in usual sterile fashion.  Single lung ventilation of the left lung was initiated and was tolerated well throughout the procedure.  An incision was made in the seventh intercostal space in the midaxillary line.  A 5-mm port was inserted into the chest.  A 4-cm working incision was made in the fourth interspace anterolaterally.  No rib spreading was performed during the procedure.  The fissures were relatively incomplete.  The inferior ligament was divided.  No significant nodes were encountered.  The pleural reflection was divided at the hilum anteriorly.  The right middle lobe vein was identified.  The branches were dissected out separately and divided with an endoscopic vascular stapler.  The dissection then was carried into the major fissure between the middle and lower lobes, and a node overlying the pulmonary artery was identified.  All lymph nodes that were encountered during the dissection were removed and sent for pathology as separate specimens. The major fissure was completed with a single firing of the endoscopic Echelon powered stapler with a 45-mm gold cartridge.  The  smaller of the 2 middle lobe pulmonary artery branches, the lateral branch, was dissected out by removing adjacent lymph nodes.  It was encircled and divided with the endoscopic vascular stapler.  This allowed isolation of the right middle lobe bronchus.  The Echelon stapler with a green cartridge was placed across the right middle lobe bronchus and closed. A test inflation showed good aeration of the upper and lower lobes.  The stapler was fired transecting the bronchus.  This then exposed the more medial branch of the pulmonary artery to the right middle lobe, which was encircled and divided with a vascular  stapler.  The resection was completed by completing the minor fissure with sequential firings of the Echelon stapler using yellow cartridges.  The right middle lobe was removed and sent for frozen section of the pulmonary nodule, which returned showing a hamartoma.  While awaiting the results of the frozen section, an On-Q local anesthetic catheter was tunneled through a separate stab incision posteriorly and tunneled into a subpleural location.  It was primed with 5 mL of 0.5% Marcaine.  A relatively large but otherwise benign-appearing level 7 lymph node was removed, and by this point the frozen section returned showing the benign hamartoma, no further lymph node dissection was performed.  A 28-French chest tube was placed at the original port incision and directed to the apex and secured with #1 silk suture.  There was good reinflation of both the upper and lower lobes.  The working incision was closed with #1 Vicryl fascial suture.  The subcutaneous tissue and skin were closed in standard fashion.  The patient was placed back in supine position.  The chest tube was placed to suction.  She was extubated in the operating room and taken to the postanesthetic care unit in good condition.     Revonda Standard Roxan Hockey, M.D.     SCH/MEDQ  D:  02/16/2017  T:  02/16/2017  Job:  [id number]

## 2017-02-16 NOTE — Transfer of Care (Signed)
Immediate Anesthesia Transfer of Care Note  Patient: Sheila Melton  Procedure(s) Performed: Procedure(s): Right VIDEO ASSISTED THORACOSCOPY (VATS)/ Right Middle LOBECTOMY (Right)  Patient Location: PACU  Anesthesia Type:General  Level of Consciousness: awake, sedated, patient cooperative and responds to stimulation  Airway & Oxygen Therapy: Patient Spontanous Breathing and Patient connected to face mask oxygen  Post-op Assessment: Report given to RN, Post -op Vital signs reviewed and stable, Patient moving all extremities and Patient moving all extremities X 4  Post vital signs: Reviewed and stable  Last Vitals:  Vitals:   02/16/17 0559  BP: (!) 157/57  Pulse: 85  Resp: 20  Temp: 37 C    Last Pain: There were no vitals filed for this visit.       Complications: No apparent anesthesia complications

## 2017-02-16 NOTE — Progress Notes (Signed)
Patient ID: Sheila Melton, female   DOB: [birth year], 76 y.o.   MRN: [id number]  SICU Evening Rounds:  Hemodynamically stable in sinus rhythm  sats 100%  CT output low. No air leak  Urine output ok

## 2017-02-16 NOTE — Interval H&P Note (Signed)
History and Physical Interval Note:  02/16/2017 7:17 AM  Sheila Melton  has presented today for surgery, with the diagnosis of right middle lobe nodule  The various methods of treatment have been discussed with the patient and family. After consideration of risks, benefits and other options for treatment, the patient has consented to  Procedure(s): Right VIDEO ASSISTED THORACOSCOPY (VATS)/ Right Middle LOBECTOMY (Right) as a surgical intervention .  The patient's history has been reviewed, patient examined, no change in status, stable for surgery.  I have reviewed the patient's chart and labs.  Questions were answered to the patient's satisfaction.     [name]

## 2017-02-16 NOTE — H&P (View-Only) (Signed)
[address]       [place] [address]             [phone]      PCP is Sheila Rail, [name], [name], NP  Chief Complaint  Patient presents with  . Lung Lesion    Surgical eva on 9 mm right middle lobe nodule , PET Scan 01/02/17,PFT 01/12/17    HPI: 76 year old woman sent for consultation regarding a right middle lobe nodule.  Sheila Melton is a 76 year old woman with a 50-pack-year history of tobacco abuse who was incidentally found to have a right middle lobe nodule. She was evaluated for hematuria and had a CT of the abdomen and pelvis. It showed a 10 x 13 mm right middle lobe nodule. Her renal issues turned out to be kidney stones when she was told she did not have a urinary tract infection. She saw Dr. Stanford Melton for an annual visit and he mentioned that she had a lung nodule. She was referred to Dr. Elsworth Melton. A follow-up scan with a PET/CT was arranged. It showed the 9 x 13 mm nodule was mildly hypermetabolic with an SUV of 1.5. She also had pulmonary function testing.  She continues to have hematuria. She also complains of decreased energy. She denies any shortness of breath, chest pain, pressure, or tightness with exertion. He has not had any change in appetite or weight loss. She does complain of decreased energy. She says that sometimes when she first lies down she has take a couple of deep breast to get her breath but no true orthopnea or paroxysmal dyspnea. She is very nervous about the possible diagnosis of cancer. She smoked a pack a day for 50 years. She currently is using E cigarettes.  Zubrod Score: At the time of surgery this patient's most appropriate activity status/level should be described as: '[x]'$     0    Normal activity, no symptoms '[]'$     1    Restricted in physical strenuous activity but ambulatory, able to do out light work '[]'$     2    Ambulatory and capable of self care, unable to do work activities, up and about >50 % of  waking hours                              '[]'$     3    Only limited self care, in bed greater than 50% of waking hours '[]'$     4    Completely disabled, no self care, confined to bed or chair '[]'$     5    Moribund   Past Medical History:  Diagnosis Date  . AAA (abdominal aortic aneurysm) (HCC)    BEING WATCHED   VVS   . Arthritis   . CAP (community acquired pneumonia) 10/24-27/14    with SIRS  . Diabetes mellitus    2  . Family history of anesthesia complication    PATIENTS MOM HAD TROUBLE WAKING UP   . Heart murmur   . Hyperlipidemia   . Hypertension    unspecified essential  . Renal insufficiency   . Subclavian steal syndrome    LEFT SIDE   NO SIDE EFFECTS  . UTI (lower urinary tract infection) 08/26/13   Enterococcus and Escherichia coli both sensitive to nitrofurantoin    Past Surgical History:  Procedure Laterality Date  . ABDOMINAL HYSTERECTOMY    .  CATARACT EXTRACTION     OS  . CYST EXCISION Right 05-23-13   Right index finger: cyst  . DILATION AND CURETTAGE OF UTERUS    . EYE SURGERY    . g1 p1    . SPINE SURGERY  Sept. 2013    Family History  Problem Relation Age of Onset  . Heart failure Mother   . Heart disease Mother   . Hypertension Mother   . Other Mother     varicose veins  . Deep vein thrombosis Mother   . Varicose Veins Mother   . Heart attack Mother   . Hypertension Sister     X 2  . Diabetes Sister   . Hyperlipidemia Sister   . Other Sister     varicose veins  . Heart disease Father   . Diabetes Father   . Hyperlipidemia Father   . Hypertension Father   . Heart attack Father   . Heart disease Brother     MI in late 5s  . Hyperlipidemia Brother   . Heart attack Brother   . Coronary artery disease    . Diabetes    . Parkinsonism Brother   . Heart failure Brother   . Hypertension Daughter     Social History Social History  Substance Use Topics  . Smoking status: Current Some Day Smoker    Packs/day: 1.00    Years: 50.00     Types: E-cigarettes  . Smokeless tobacco: Never Used     Comment: e-cigs  . Alcohol use No    Current Outpatient Prescriptions  Medication Sig Dispense Refill  . amLODipine (NORVASC) 5 MG tablet TAKE 1 TABLET TWICE DAILY. 180 tablet 3  . Ascorbic Acid (VITAMIN C PO) Take by mouth.    Marland Kitchen aspirin EC 81 MG tablet Take 1 tablet (81 mg total) by mouth daily. 90 tablet 3  . B Complex-C (SUPER B COMPLEX PO) Take 1 tablet by mouth daily.     . Blood Glucose Monitoring Suppl (ONE TOUCH ULTRA 2) w/Device KIT Use as advised 1 each 0  . Cholecalciferol (VITAMIN D3) 2000 UNITS TABS Take 2,000 Units by mouth daily.     . diazepam (VALIUM) 5 MG tablet Take 1 tablet (5 mg total) by mouth at bedtime as needed. 90 tablet 0  . fexofenadine (ALLEGRA) 180 MG tablet Take 180 mg by mouth at bedtime.     Marland Kitchen FLUoxetine (PROZAC) 10 MG capsule Take 1 capsule (10 mg total) by mouth daily. 90 capsule 3  . furosemide (LASIX) 40 MG tablet TAKE 1/2 TO 1 TABLET DAILY AS NEEDED FOR FLUID 90 tablet 3  . glipiZIDE (GLUCOTROL) 5 MG tablet TAKE 2 TABLETS IN AM AND 1 TAB IN PM 270 tablet 3  . labetalol (NORMODYNE) 300 MG tablet TAKE 1 TABLET TWICE DAILY. 180 tablet 3  . meloxicam (MOBIC) 7.5 MG tablet Take 1 tablet (7.5 mg total) by mouth daily. 90 tablet 1  . metFORMIN (GLUCOPHAGE) 500 MG tablet Take 1 tablet (500 mg total) by mouth 2 (two) times daily. 180 tablet 3  . ONE TOUCH ULTRA TEST test strip CHECK BLOOD SUGAR ONCE A DAY. 100 each 0  . pravastatin (PRAVACHOL) 40 MG tablet TAKE 1 TABLET DAILY. 90 tablet 3  . TRAMADOL HCL PO Take 50 mg by mouth as needed.     . valsartan (DIOVAN) 320 MG tablet TAKE ONE TABLET AT BEDTIME. 90 tablet 3  . vitamin E 400 UNIT capsule Take 400 Units by  mouth daily.     No current facility-administered medications for this visit.     Allergies  Allergen Reactions  . Amoxicillin     REACTION: rash  . Rocephin [Ceftriaxone Sodium In Dextrose]     10/24 /14 blisters of palms & diffuse  rash Because of a history of documented adverse serious drug reaction;Medi Alert bracelet  is recommended  . Captopril     REACTION: cough  . Simvastatin     REACTION: buttocks cramps  . Adhesive [Tape] Rash    Review of Systems  Constitutional: Positive for fatigue. Negative for appetite change and unexpected weight change.  Eyes: Negative for visual disturbance.  Respiratory: Negative for cough, shortness of breath and wheezing.   Cardiovascular: Negative for chest pain and leg swelling.  Gastrointestinal: Negative for abdominal pain and blood in stool.  Genitourinary: Positive for hematuria. Negative for dysuria.  Musculoskeletal: Positive for arthralgias, back pain (Chronic) and joint swelling.  Neurological: Negative for seizures, syncope, weakness and headaches.  Hematological: Negative for adenopathy. Bruises/bleeds easily.  Psychiatric/Behavioral: Positive for dysphoric mood. The patient is nervous/anxious.   All other systems reviewed and are negative.   BP 130/73   Pulse 92   Resp 20   Ht 5' 3.5" (1.613 m)   Wt 165 lb (74.8 kg)   SpO2 93% Comment: RA  BMI 28.77 kg/m  Physical Exam  Constitutional: She is oriented to person, place, and time. She appears well-developed and well-nourished. No distress.  HENT:  Head: Normocephalic and atraumatic.  Mouth/Throat: No oropharyngeal exudate.  Eyes: Conjunctivae and EOM are normal. Pupils are equal, round, and reactive to light. No scleral icterus.  Neck: Neck supple. No thyromegaly present.  Cardiovascular: Normal rate, regular rhythm and intact distal pulses.  Exam reveals no gallop and no friction rub.   No murmur heard. Pulmonary/Chest: Effort normal and breath sounds normal. No respiratory distress. She has no wheezes. She has no rales.  Abdominal: Soft. She exhibits no distension. There is no tenderness.  Musculoskeletal: She exhibits no edema.  Lymphadenopathy:    She has no cervical adenopathy.  Neurological: She  is alert and oriented to person, place, and time. No cranial nerve deficit.  No focal motor deficit  Skin: Skin is warm and dry.  Vitals reviewed.   Diagnostic Tests: CTA ABDOMEN AND PELVIS WITHOUT AND WITH CONTRAST  TECHNIQUE: Multidetector CT imaging of the abdomen and pelvis was performed using the standard protocol during bolus administration of intravenous contrast. Multiplanar reconstructed images and MIPs were obtained and reviewed to evaluate the vascular anatomy.  CONTRAST:  80 cc of Isovue 370.  The GFR was 38.  COMPARISON:  CT abdomen from 03/19/2015, CXR 09/19/2013  FINDINGS: VASCULAR  Aorta: 4.2 cm infrarenal abdominal aortic aneurysm without rupture. Soft plaque noted along the anterior and lateral walls. Aneurysm terminates at the bifurcation.  Celiac: Small vascular web versus mild stenosis just distal to the origin of the celiac, series 4, image 44. The origin is not visualized and stenosis is not entirely excluded.  SMA: Mild atherosclerosis at the origin of the SMA. SMA is patent without significant stenosis.  Renals: Single renal arteries to both kidneys without significant stenosis. No evidence of aneurysm or fibromuscular dysplasia.  IMA: Patent without evidence of aneurysm, dissection, vasculitis or significant stenosis.  Inflow: There is atherosclerosis of both common iliac arteries without aneurysm. Both common iliac arteries measuring 3 mm in caliber.  Proximal Outflow: Right common femoral artery 7 mm in diameter and the  left 5 mm with calcified plaque noted.  Veins: No obvious venous abnormality within the limitations of this arterial phase study.  Review of the MIP images confirms the above findings.  NON-VASCULAR  Lower chest: The heart is normal in size. There is coronary arteriosclerosis. Right middle lobe 13 x 10 mm nodule is noted not apparent on prior comparison CT nor visualized on prior chest radiograph from  09/19/2013.  Hepatobiliary: No space-occupying mass of the liver. Contracted gallbladder without stones. No biliary dilatation.  Pancreas: No pancreatic mass.  Pancreatic duct is normal in caliber.  Spleen: No splenomegaly.  Adrenals/Urinary Tract: Normal adrenal glands. Nonobstructing lower pole left renal calculus measuring 5 mm. Small extrarenal pelves are noted bilaterally with a simple cyst cyst in the lower pole the right kidney measuring 4.4 x 4.8 cm. Smaller upper pole cysts are noted too small to further characterize.  Stomach/Bowel: Stomach is within normal limits. Appendix appears normal. No evidence of bowel wall thickening, distention, or inflammatory changes.  Lymphatic: No enlarged abdominal or pelvic lymph nodes.  Reproductive: Hysterectomy.  Other: Small fat containing umbilical hernia. No ascites or free air.  Musculoskeletal: Lumbar disc fusion from L3 through S1 with degenerative disc disease at L2-3 and L5-S1. Low lung volume No acute osseous abnormality.  IMPRESSION: VASCULAR  Stable 4.2 cm infrarenal abdominal aortic aneurysm.  Possible tiny vascular lab in the proximal celiac artery. The origin of the right celiac artery is not identified and soft plaque is not excluded versus focal stenosis.  NON-VASCULAR  13 x 10 mm right middle lobe pulmonary nodule. Consider one of the following in 3 months for both low-risk and high-risk individuals: (a) repeat chest CT, (b) follow-up PET-CT, or (c) tissue sampling. This recommendation follows the consensus statement: Guidelines for Management of Incidental Pulmonary Nodules Detected on CT Images: From the Fleischner Society 2017; Radiology 2017; 284:228-243.  Nonobstructing 5 mm left renal stone with bilateral renal cysts. No obstructive uropathy.  L3 through L5 lumbar disc fusion. Degenerative disc disease above and below the fusion.   Electronically Signed   By: Tollie Eth  M.D.   On: 11/08/2016 03:26 NUCLEAR MEDICINE PET SKULL BASE TO THIGH  TECHNIQUE: 8.0 MCi F-18 FDG was injected intravenously. Full-ring PET imaging was performed from the skull base to thigh after the radiotracer. CT data was obtained and used for attenuation correction and anatomic localization.  FASTING BLOOD GLUCOSE:  Value: 169 mg/dl  COMPARISON:  MRI abdomen dated 12/03/2016. CT abdomen/pelvis dated 11/08/2016.  FINDINGS: NECK  No hypermetabolic lymph nodes in the neck.  CHEST  Evaluation of the lung parenchyma is constrained by respiratory motion.  9 mm right middle lobe nodule (series 8/ image 34) with mild hypermetabolism, max SUV 1.5. No focal consolidation. Trace right pleural effusion. No pneumothorax.  Heart is normal in size. Trace pericardial effusion. Three vessel coronary atherosclerosis. No evidence of thoracic aortic aneurysm. Atherosclerotic calcifications of the aortic arch.  No hypermetabolic thoracic lymphadenopathy.  ABDOMEN/PELVIS  No abnormal hypermetabolic activity within the liver, pancreas, adrenal glands, or spleen.  4.4 cm infrarenal abdominal aortic aneurysm (series 4/image 127) with eccentric mural thrombus. Atherosclerotic calcifications the abdominal aorta and branch vessels.  6 mm nonobstructing left lower pole renal calculus (series 4/image 114). 4.4 cm medial right lower pole renal cyst (series 4/image 123). Status post hysterectomy.  No hypermetabolic lymph nodes in the abdomen or pelvis.  SKELETON  No focal hypermetabolic activity to suggest skeletal metastasis.  IMPRESSION: 9 mm right middle lobe nodule  with mild hypermetabolism, worrisome for early primary bronchogenic neoplasm.  No findings specific for metastatic disease.  Trace right pleural effusion.  4.4 cm infrarenal abdominal aortic aneurysm. Recommend followup by [place] in 1 year. This recommendation follows ACR consensus  guidelines: White Paper of the ACR Incidental Findings Committee II on Vascular Findings. J Am Coll Radiol 2013; 10:789-794.  Additional ancillary findings as above.   Electronically Signed   By: Julian Hy M.D.   On: 01/02/2017 10:13 I personally reviewed the CT and PET/CT and concur with the findings noted above. I do think this nodule may well be benign, but certainly cannot rule out a low-grade cancer.  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 She had a CT of the abdomen back in December to evaluate for hematuria. She was noted to have a right middle lobe lung nodule. This is a roughly 9 x 13 mm and is relatively smoothly marginated. On PET/CT the nodule is mildly hypermetabolic with an SUV of 1.5. Differential diagnosis includes infectious and inflammatory nodules, however neoplasm is more likely. This could represent either a carcinoid tumor or a primary bronchogenic carcinoma. Biopsy is indicated for definitive diagnosis. Even if this is a primary bronchogenic carcinoma, it is likely is a relatively well-differentiated low-grade tumor.  I had a long discussion with Mr. and Mrs. Everetts and reviewed the CT and PET images with them. We discussed possible means for establishing a diagnosis as well as treating the nodule. She is very anxious about the nodule and wishes to have it removed surgically. She does understand there are other reasonable options including radiographic follow-up or stereotactic radiation. She is not interested in those approaches.  I discussed the proposed procedure of right VATS and right middle lobectomy  with Mrs. Olds and her husband. They understand that due to the location of the tumor, a wedge resection likely render the remaining middle lobe nonfunctional. They understand this would be done in the operating room under general anesthesia. We discussed the incisions to be used and the need for drainage to postoperatively. We discussed the expected hospital stay and the overall recovery. I reviewed the indications, risks, benefits, and alternatives. They understand the risk include, but are not limited to death, MI, DVT, PE, stroke, bleeding, possible need for transfusion, infection, prolonged air leak, cardiac arrhythmias, as well as the possibility of other unforeseeable complications. She understands and accepts the risk and wishes to proceed. She would like to wait until after Easter to have the procedure done.  Plan: Right VATS, right middle lobectomy on Monday, 02/16/2017.  [name], MD [facility] [phone]

## 2017-02-16 NOTE — Anesthesia Procedure Notes (Signed)
Procedure Name: Intubation Date/Time: 02/16/2017 8:08 AM Performed by: Jacquiline Doe A Pre-anesthesia Checklist: Patient identified, Emergency Drugs available, Suction available and Patient being monitored Patient Re-evaluated:Patient Re-evaluated prior to inductionOxygen Delivery Method: Circle System Utilized and Circle system utilized Preoxygenation: Pre-oxygenation with 100% oxygen Intubation Type: IV induction and Cricoid Pressure applied Ventilation: Mask ventilation without difficulty Laryngoscope Size: Mac and 3 Grade View: Grade I Tube type: Oral Endobronchial tube: Left, Double lumen EBT and EBT position confirmed by fiberoptic bronchoscope and 37 Fr Number of attempts: 1 Airway Equipment and Method: Stylet,  Oral airway and Fiberoptic brochoscope Placement Confirmation: ETT inserted through vocal cords under direct vision,  positive ETCO2 and breath sounds checked- equal and bilateral Secured at: 29 cm Tube secured with: Tape Dental Injury: Teeth and Oropharynx as per pre-operative assessment

## 2017-02-17 ENCOUNTER — Encounter (HOSPITAL_COMMUNITY): Payer: Self-pay | Admitting: Thoracic Surgery (Cardiothoracic Vascular Surgery)

## 2017-02-17 ENCOUNTER — Inpatient Hospital Stay (HOSPITAL_COMMUNITY): Payer: PPO

## 2017-02-17 LAB — GLUCOSE, CAPILLARY
GLUCOSE-CAPILLARY: 182 mg/dL — AB (ref 65–99)
GLUCOSE-CAPILLARY: 196 mg/dL — AB (ref 65–99)
GLUCOSE-CAPILLARY: 176 mg/dL — AB (ref 65–99)
Glucose-Capillary: 176 mg/dL — ABNORMAL HIGH (ref 65–99)

## 2017-02-17 LAB — CBC
HEMATOCRIT: 33.3 % — AB (ref 36.0–46.0)
Hemoglobin: 10.6 g/dL — ABNORMAL LOW (ref 12.0–15.0)
MCH: 27.4 pg (ref 26.0–34.0)
MCHC: 31.8 g/dL (ref 30.0–36.0)
MCV: 86 fL (ref 78.0–100.0)
Platelets: 321 10*3/uL (ref 150–400)
RBC: 3.87 MIL/uL (ref 3.87–5.11)
RDW: 14.3 % (ref 11.5–15.5)
WBC: 17.1 10*3/uL — AB (ref 4.0–10.5)

## 2017-02-17 LAB — BASIC METABOLIC PANEL
ANION GAP: 9 (ref 5–15)
BUN: 10 mg/dL (ref 6–20)
CALCIUM: 8.8 mg/dL — AB (ref 8.9–10.3)
CO2: 28 mmol/L (ref 22–32)
Chloride: 103 mmol/L (ref 101–111)
Creatinine, Ser: 0.98 mg/dL (ref 0.44–1.00)
GFR, EST NON AFRICAN AMERICAN: 55 mL/min — AB (ref >60)
Glucose, Bld: 200 mg/dL — ABNORMAL HIGH (ref 65–99)
Potassium: 3.1 mmol/L — ABNORMAL LOW (ref 3.5–5.1)
SODIUM: 140 mmol/L (ref 135–145)

## 2017-02-17 MED ORDER — SODIUM CHLORIDE 0.9 % IV SOLN: INTRAVENOUS | Status: DC

## 2017-02-17 MED ORDER — METFORMIN HCL 500 MG PO TABS
500.0000 mg | ORAL_TABLET | Freq: Two times a day (BID) | ORAL | Status: DC
  Administered 2017-02-17 – 2017-02-19 (×4): 500 mg via ORAL
  Filled 2017-02-17 (×5): qty 1

## 2017-02-17 MED ORDER — INSULIN ASPART 100 UNIT/ML ~~LOC~~ SOLN
0.0000 [IU] | Freq: Three times a day (TID) | SUBCUTANEOUS | Status: DC
  Administered 2017-02-18: 5 [IU] via SUBCUTANEOUS
  Administered 2017-02-19: 2 [IU] via SUBCUTANEOUS

## 2017-02-17 MED ORDER — DIAZEPAM 5 MG PO TABS: 5.0000 mg | ORAL_TABLET | Freq: Every evening | ORAL | Status: DC | PRN

## 2017-02-17 MED ORDER — INSULIN ASPART 100 UNIT/ML ~~LOC~~ SOLN: 0.0000 [IU] | Freq: Every day | SUBCUTANEOUS | Status: DC

## 2017-02-17 MED ORDER — ENOXAPARIN SODIUM 40 MG/0.4ML ~~LOC~~ SOLN
40.0000 mg | SUBCUTANEOUS | Status: DC
  Administered 2017-02-17 – 2017-02-19 (×3): 40 mg via SUBCUTANEOUS
  Filled 2017-02-17 (×3): qty 0.4

## 2017-02-17 MED ORDER — ALBUTEROL SULFATE (2.5 MG/3ML) 0.083% IN NEBU: 2.5000 mg | INHALATION_SOLUTION | Freq: Four times a day (QID) | RESPIRATORY_TRACT | Status: DC | PRN

## 2017-02-17 MED ORDER — GLIPIZIDE 5 MG PO TABS
5.0000 mg | ORAL_TABLET | Freq: Every day | ORAL | Status: DC
  Administered 2017-02-17 – 2017-02-18 (×2): 5 mg via ORAL
  Filled 2017-02-17 (×2): qty 1

## 2017-02-17 MED ORDER — CIPROFLOXACIN HCL 500 MG PO TABS
500.0000 mg | ORAL_TABLET | Freq: Two times a day (BID) | ORAL | Status: AC
  Administered 2017-02-17 – 2017-02-19 (×6): 500 mg via ORAL
  Filled 2017-02-17 (×6): qty 1

## 2017-02-17 MED ORDER — POTASSIUM CHLORIDE 2 MEQ/ML IV SOLN
30.0000 meq | Freq: Once | INTRAVENOUS | Status: AC
  Administered 2017-02-17: 30 meq via INTRAVENOUS
  Filled 2017-02-17: qty 15

## 2017-02-17 NOTE — Anesthesia Postprocedure Evaluation (Addendum)
Anesthesia Post Note  Patient: KALEY JUTRAS  Procedure(s) Performed: Procedure(s) (LRB): Right VIDEO ASSISTED THORACOSCOPY (VATS)/ Right Middle LOBECTOMY (Right)  Patient location during evaluation: PACU Anesthesia Type: General Level of consciousness: awake and alert Pain management: pain level controlled Vital Signs Assessment: post-procedure vital signs reviewed and stable Respiratory status: spontaneous breathing, nonlabored ventilation, respiratory function stable and patient connected to nasal cannula oxygen Cardiovascular status: blood pressure returned to baseline and stable Postop Assessment: no signs of nausea or vomiting Anesthetic complications: no       Last Vitals:  Vitals:   02/17/17 0901 02/17/17 1000  BP: (!) 161/69 (!) 70/43  Pulse:  61  Resp:  19  Temp:      Last Pain:  Vitals:   02/17/17 0800  TempSrc:   PainSc: 0-No pain                 Chrisann Melaragno

## 2017-02-17 NOTE — Progress Notes (Addendum)
1 Day Post-Op Procedure(s) (LRB): Right VIDEO ASSISTED THORACOSCOPY (VATS)/ Right Middle LOBECTOMY (Right) Subjective: Some discomfort, denies nausea and difficulty breathing  Objective: Vital signs in last 24 hours: Temp:  [97 F (36.1 C)-98.6 F (37 C)] 97.7 F (36.5 C) (04/10 0759) Pulse Rate:  [85-106] 87 (04/10 0700) Cardiac Rhythm: Normal sinus rhythm (04/10 0600) Resp:  [13-20] 18 (04/10 0755) BP: (130-171)/(53-75) 164/75 (04/10 0700) SpO2:  [88 %-99 %] 97 % (04/10 0755) Arterial Line BP: (153-201)/(49-76) 201/62 (04/10 0700) Weight:  [158 lb 15.2 oz (72.1 kg)] 158 lb 15.2 oz (72.1 kg) (04/09 1530)  Hemodynamic parameters for last 24 hours:    Intake/Output from previous day: 04/09 0701 - 04/10 0700 In: 7942.5 [P.O.:180; I.V.:4662.5; IV Piggyback:450] Out: 2605 [Urine:2175; Blood:250; Chest Tube:180] Intake/Output this shift: Total I/O In: 390 [I.V.:125; IV Piggyback:265] Out: 420 [Urine:350; Chest Tube:70]  General appearance: alert, cooperative and no distress Neurologic: intact Heart: regular rate and rhythm Lungs: slightly diminished at right base, no wheezing no air leak  Lab Results:  Recent Labs  02/17/17 0357  WBC 17.1*  HGB 10.6*  HCT 33.3*  PLT 321   BMET:  Recent Labs  02/17/17 0357  NA 140  K 3.1*  CL 103  CO2 28  GLUCOSE 200*  BUN 10  CREATININE 0.98  CALCIUM 8.8*    PT/INR: No results for input(s): LABPROT, INR in the last 72 hours. ABG    Component Value Date/Time   PHART 7.441 02/12/2017 1027   HCO3 24.1 02/12/2017 1027   O2SAT 93.0 02/12/2017 1027   CBG (last 3)   Recent Labs  02/16/17 1738 02/16/17 2307 02/17/17 0700  GLUCAP 249* 191* 182*    Assessment/Plan: S/P Procedure(s) (LRB): Right VIDEO ASSISTED THORACOSCOPY (VATS)/ Right Middle LOBECTOMY (Right) Plan for transfer to step-down: see transfer orders   Doing well POD # 1  Hypertension- restart amlodipine DM- CBG elevated, restart glucotrol and  metformin, continue SSI SCD + enoxaparin for DVT prophylaxis CT to water seal Ambulate Advance diet Complete course of Cipro for out of hospital UTI   LOS: 1 day    [name] 02/17/2017

## 2017-02-17 NOTE — Progress Notes (Signed)
      [address]       [place], [address]             [phone]      No complaints  CBG elevated- dc D5 ns, change to NS  BP (!) 162/70   Pulse 91   Temp 97.1 F (36.2 C) (Axillary)   Resp 18   Ht 5\' 4"  (1.626 m)   Wt 158 lb 15.2 oz (72.1 kg)   SpO2 97%   BMI 27.28 kg/m    Intake/Output Summary (Last 24 hours) at 02/17/17 1823 Last data filed at 02/17/17 1800  Gross per 24 hour  Intake          6348.75 ml  Output             1710 ml  Net          4638.75 ml    Awaiting bed on stepdown  Sheila [place] C. Roxan Hockey, MD [facility] [phone]

## 2017-02-18 ENCOUNTER — Inpatient Hospital Stay (HOSPITAL_COMMUNITY): Payer: PPO

## 2017-02-18 LAB — GLUCOSE, CAPILLARY
GLUCOSE-CAPILLARY: 104 mg/dL — AB (ref 65–99)
GLUCOSE-CAPILLARY: 108 mg/dL — AB (ref 65–99)
Glucose-Capillary: 212 mg/dL — ABNORMAL HIGH (ref 65–99)

## 2017-02-18 LAB — CBC
HCT: 33.9 % — ABNORMAL LOW (ref 36.0–46.0)
Hemoglobin: 10.6 g/dL — ABNORMAL LOW (ref 12.0–15.0)
MCH: 27.5 pg (ref 26.0–34.0)
MCHC: 31.3 g/dL (ref 30.0–36.0)
MCV: 87.8 fL (ref 78.0–100.0)
PLATELETS: 325 10*3/uL (ref 150–400)
RBC: 3.86 MIL/uL — ABNORMAL LOW (ref 3.87–5.11)
RDW: 14.8 % (ref 11.5–15.5)
WBC: 16.5 10*3/uL — ABNORMAL HIGH (ref 4.0–10.5)

## 2017-02-18 LAB — COMPREHENSIVE METABOLIC PANEL
ALBUMIN: 3 g/dL — AB (ref 3.5–5.0)
ALT: 18 U/L (ref 14–54)
AST: 24 U/L (ref 15–41)
Alkaline Phosphatase: 67 U/L (ref 38–126)
Anion gap: 11 (ref 5–15)
BUN: 17 mg/dL (ref 6–20)
CHLORIDE: 105 mmol/L (ref 101–111)
CO2: 24 mmol/L (ref 22–32)
Calcium: 8.7 mg/dL — ABNORMAL LOW (ref 8.9–10.3)
Creatinine, Ser: 1.12 mg/dL — ABNORMAL HIGH (ref 0.44–1.00)
GFR calc Af Amer: 55 mL/min — ABNORMAL LOW (ref >60)
GFR calc non Af Amer: 47 mL/min — ABNORMAL LOW (ref >60)
GLUCOSE: 157 mg/dL — AB (ref 65–99)
POTASSIUM: 3.9 mmol/L (ref 3.5–5.1)
Sodium: 140 mmol/L (ref 135–145)
Total Bilirubin: 0.3 mg/dL (ref 0.3–1.2)
Total Protein: 5.7 g/dL — ABNORMAL LOW (ref 6.5–8.1)

## 2017-02-18 MED ORDER — GLIPIZIDE 10 MG PO TABS
10.0000 mg | ORAL_TABLET | Freq: Every day | ORAL | Status: DC
  Administered 2017-02-18: 10 mg via ORAL
  Filled 2017-02-18 (×2): qty 1

## 2017-02-18 MED ORDER — GLIPIZIDE 5 MG PO TABS
5.0000 mg | ORAL_TABLET | Freq: Every day | ORAL | Status: DC
  Administered 2017-02-19: 5 mg via ORAL
  Filled 2017-02-18 (×2): qty 1

## 2017-02-18 NOTE — Progress Notes (Signed)
2 Days Post-Op Procedure(s) (LRB): Right VIDEO ASSISTED THORACOSCOPY (VATS)/ Right Middle LOBECTOMY (Right) Subjective: No complaints this AM  Objective: Vital signs in last 24 hours: Temp:  [97.1 F (36.2 C)-98.5 F (36.9 C)] 97.7 F (36.5 C) (04/11 0300) Pulse Rate:  [61-118] 97 (04/11 0700) Cardiac Rhythm: Normal sinus rhythm (04/11 0400) Resp:  [14-33] 22 (04/11 0800) BP: (70-162)/(42-114) 99/58 (04/11 0700) SpO2:  [89 %-98 %] 96 % (04/11 0800) Weight:  [163 lb 3.2 oz (74 kg)] 163 lb 3.2 oz (74 kg) (04/11 0500)  Hemodynamic parameters for last 24 hours:    Intake/Output from previous day: 04/10 0701 - 04/11 0700 In: 2061.8 [P.O.:720; I.V.:808.8; IV Piggyback:530] Out: 3729 [Urine:3670; Chest Tube:240] Intake/Output this shift: Total I/O In: 30 [I.V.:30] Out: 125 [Urine:125]  General appearance: alert, cooperative and no distress Neurologic: intact Heart: regular rate and rhythm Lungs: diminished breath sounds right base no air leak  Lab Results:  Recent Labs  02/17/17 0357 02/18/17 0252  WBC 17.1* 16.5*  HGB 10.6* 10.6*  HCT 33.3* 33.9*  PLT 321 325   BMET:  Recent Labs  02/17/17 0357 02/18/17 0252  NA 140 140  K 3.1* 3.9  CL 103 105  CO2 28 24  GLUCOSE 200* 157*  BUN 10 17  CREATININE 0.98 1.12*  CALCIUM 8.8* 8.7*    PT/INR: No results for input(s): LABPROT, INR in the last 72 hours. ABG    Component Value Date/Time   PHART 7.441 02/12/2017 1027   HCO3 24.1 02/12/2017 1027   O2SAT 93.0 02/12/2017 1027   CBG (last 3)   Recent Labs  02/17/17 1319 02/17/17 1748 02/17/17 2127  GLUCAP 196* 176* 176*    Assessment/Plan: S/P Procedure(s) (LRB): Right VIDEO ASSISTED THORACOSCOPY (VATS)/ Right Middle LOBECTOMY (Right) Plan for transfer to step-down: see transfer orders  No air leak- will dc chest tube this AM Change from PCA to PO meds IS, wean O2 CBG better with current regimen- continue SSI as needed DC Foley Ambulate    LOS: 2  days    [name] 02/18/2017

## 2017-02-18 NOTE — Progress Notes (Signed)
75ml of Fentanyl syringe wasted in sink with Newman Nickels, RN

## 2017-02-19 ENCOUNTER — Inpatient Hospital Stay (HOSPITAL_COMMUNITY): Payer: PPO

## 2017-02-19 LAB — CBC
HEMATOCRIT: 31.4 % — AB (ref 36.0–46.0)
HEMOGLOBIN: 9.9 g/dL — AB (ref 12.0–15.0)
MCH: 27.5 pg (ref 26.0–34.0)
MCHC: 31.5 g/dL (ref 30.0–36.0)
MCV: 87.2 fL (ref 78.0–100.0)
Platelets: 317 10*3/uL (ref 150–400)
RBC: 3.6 MIL/uL — ABNORMAL LOW (ref 3.87–5.11)
RDW: 14.8 % (ref 11.5–15.5)
WBC: 16.9 10*3/uL — ABNORMAL HIGH (ref 4.0–10.5)

## 2017-02-19 LAB — BASIC METABOLIC PANEL
Anion gap: 9 (ref 5–15)
BUN: 16 mg/dL (ref 6–20)
CALCIUM: 8.8 mg/dL — AB (ref 8.9–10.3)
CO2: 25 mmol/L (ref 22–32)
CREATININE: 1.15 mg/dL — AB (ref 0.44–1.00)
Chloride: 105 mmol/L (ref 101–111)
GFR calc non Af Amer: 46 mL/min — ABNORMAL LOW (ref >60)
GFR, EST AFRICAN AMERICAN: 53 mL/min — AB (ref >60)
GLUCOSE: 123 mg/dL — AB (ref 65–99)
Potassium: 3.4 mmol/L — ABNORMAL LOW (ref 3.5–5.1)
Sodium: 139 mmol/L (ref 135–145)

## 2017-02-19 LAB — GLUCOSE, CAPILLARY
GLUCOSE-CAPILLARY: 135 mg/dL — AB (ref 65–99)
Glucose-Capillary: 118 mg/dL — ABNORMAL HIGH (ref 65–99)

## 2017-02-19 MED ORDER — GUAIFENESIN 100 MG/5ML PO SOLN: 5.0000 mL | ORAL | Status: DC | PRN

## 2017-02-19 MED ORDER — TRAMADOL HCL 50 MG PO TABS: 50.0000 mg | ORAL_TABLET | Freq: Every day | ORAL | 0 refills | Status: DC | PRN

## 2017-02-19 MED ORDER — AMLODIPINE BESYLATE 10 MG PO TABS: 10.0000 mg | ORAL_TABLET | Freq: Every day | ORAL | 1 refills | Status: DC

## 2017-02-19 MED ORDER — POTASSIUM CHLORIDE CRYS ER 20 MEQ PO TBCR
40.0000 meq | EXTENDED_RELEASE_TABLET | Freq: Once | ORAL | Status: AC
  Administered 2017-02-19: 40 meq via ORAL
  Filled 2017-02-19: qty 2

## 2017-02-19 MED ORDER — POTASSIUM CHLORIDE CRYS ER 20 MEQ PO TBCR
20.0000 meq | EXTENDED_RELEASE_TABLET | Freq: Once | ORAL | Status: AC
  Administered 2017-02-19: 20 meq via ORAL
  Filled 2017-02-19: qty 1

## 2017-02-19 NOTE — Discharge Summary (Signed)
Physician Discharge Summary       [address]       [place] [address]             [phone]    Patient ID: Sheila Melton MRN: [id number] DOB/AGE: August 09, 1941 76 y.o.  Admit date: 02/16/2017 Discharge date: 02/19/2017  Admission Diagnoses: Right middle lobe nodule  Active Diagnoses:  1. Hamartoma RML 2. AAA (abdominal aortic aneurysm) (HCC)-watched by [facility] 3. CAP (community acquired pneumonia) 4. Diabetes mellitus-type 2 5. Hypertension 6. Hyperlipidemia 7. Renal insufficiency 8. Subclavian steal syndrome 9. UTI (lower urinary tract infection) 10. Tobacco abuse 11. ABL anemia  Procedure (s):  Right video-assisted thoracoscopy, right middle lobectomy, on-Q local anesthetic catheter placement by Dr. Roxan Hockey on 02/16/2017.  Pathology: Diagnosis 1. Lung, resection (segmental or lobe), Right middle lobe - PULMONARY HAMARTOMA, 1.2 CM. - THE SURGICAL RESECTION MARGINS ARE NEGATIVE FOR TUMOR. - THERE IS NO EVIDENCE OF MALIGNANCY. 2. Lymph node, biopsy, 12 - THERE IS NO EVIDENCE OF CARCINOMA IN 1 OF 1 LYMPH NODE (0/1). 3. Lymph node, biopsy, 12 #2 - THERE IS NO EVIDENCE OF CARCINOMA IN 1 OF 1 LYMPH NODE (0/1). 4. Lymph node, biopsy, 12 #3 - THERE IS NO EVIDENCE OF CARCINOMA IN 1 OF 1 LYMPH NODE (0/1). 5. Lymph node, biopsy, 11 - THERE IS NO EVIDENCE OF CARCINOMA IN 1 OF 1 LYMPH NODE (0/1). 6. Lymph node, biopsy, 7 - THERE IS NO EVIDENCE OF CARCINOMA IN 1 OF 1 LYMPH NODE (0/1). 7. Lymph node, biopsy, 7 #2 - THERE IS NO EVIDENCE OF CARCINOMA IN 1 OF 1 LYMPH NODE (0/1).  History of Presenting Illness: This is a63 year old woman with a 50-pack-year history of tobacco abuse who was incidentally found to have a right middle lobe nodule. She was evaluated for hematuria and had a CT of the abdomen and pelvis. It showed a 10 x 13 mm right middle lobe nodule. Her renal issues turned out to be kidney stones when she was told she did not have a urinary tract  infection. She saw Dr. Stanford Breed for an annual visit and he mentioned that she had a lung nodule. She was referred to Dr. Elsworth Soho. A follow-up scan with a PET/CT was arranged. It showed the 9 x 13 mm nodule was mildly hypermetabolic with an SUV of 1.5. She also had pulmonary function testing.  She continues to have hematuria. She also complains of decreased energy. She denies any shortness of breath, chest pain, pressure, or tightness with exertion. He has not had any change in appetite or weight loss. She does complain of decreased energy. She says that sometimes when she first lies down she has take a couple of deep breast to get her breath but no true orthopnea or paroxysmal dyspnea. She is very nervous about the possible diagnosis of cancer. She smoked a pack a day for 50 years. She currently is using E cigarettes.  Dr. Roxan Hockey discussed the proposed procedure of right VATS and right middle lobectomy with Mrs. Turnbough and her husband. They understand that due to the location of the tumor, a wedge resection likely render the remaining middle lobe nonfunctional. They understand this would be done in the operating room under general anesthesia. They discussed the incisions to be used and the need for drainage to postoperatively. They discussed the expected hospital stay and the overall recovery. I reviewed the indications, risks, benefits, and alternatives. Patient underwent the aforementioned procedure 02/16/2017.   Brief Hospital Course:  The  patient remained afebrile and hemodynamically stable. A line and foley were removed early in the post operative course. Chest tube output gradually decreased. There was no air leak. Chest tube was placed ot water seal on 02/17/2017. Daily chest x rays were obtained and remained stable. All chest tubes were removed by 02/19/2016. Patient is ambulating on room air. Patient is tolerating a diet and has had a bowel movement. Wounds are clean and dry. Final chest X ray  showed no pneumothorax, persistent right volume loss, and atelectasis. Patient is felt surgically stable for discharge today.   Latest Vital Signs: Blood pressure (!) 158/59, pulse 96, temperature 98.2 F (36.8 C), temperature source Oral, resp. rate 18, height _0  (1.626 m), weight 71.2 kg (157 lb), SpO2 90 %.  Physical Exam: Cardiovascular: RRR Pulmonary: Decreased at right base Wound: Clean and dry.  No erythema or signs of infection. Bruising proximal to wound  Discharge Condition:Stable and discharge to home.  Recent laboratory studies:  Lab Results  Component Value Date   WBC 16.9 (H) 02/19/2017   HGB 9.9 (L) 02/19/2017   HCT 31.4 (L) 02/19/2017   MCV 87.2 02/19/2017   PLT 317 02/19/2017   Lab Results  Component Value Date   NA 139 02/19/2017   K 3.4 (L) 02/19/2017   CL 105 02/19/2017   CO2 25 02/19/2017   CREATININE 1.15 (H) 02/19/2017   GLUCOSE 123 (H) 02/19/2017    Diagnostic Studies:  CLINICAL DATA:  Status post right middle lobectomy 3 days ago.  EXAM: CHEST  2 VIEW  COMPARISON:  Portable chest x-ray of February 18, 2017  FINDINGS: There is persistent volume loss on the right consistent with the recent surgery with accumulation of pleural fluid and/or elevation of the right hemidiaphragm. There is new linear increased density in the inferior aspect of the right upper lobe. The left lung is well-expanded and clear. The heart and pulmonary vascularity are normal. There is calcification in the wall of the aortic arch. The bony thorax exhibits no acute abnormality.  IMPRESSION: Persistent right-sided volume loss likely reflects a combination of elevation of the hemidiaphragm with small pleural effusion. There is new atelectasis in the inferior aspect of the right upper lobe. The left lung is clear.  Thoracic aortic atherosclerosis.   Electronically Signed   By: David  Martinique M.D.   On: 02/19/2017 09:25  Discharge Medications: Allergies as  of 02/19/2017      Reactions   Rocephin [ceftriaxone Sodium In Dextrose] Dermatitis, Rash   10/24 /14 blisters of palms & diffuse rash Because of a history of documented adverse serious drug reaction;Medi Alert bracelet  is recommended   Amoxicillin Rash   Has patient had a PCN reaction causing IMMEDIATE RASH, FACIAL/TONGUE/THROAT SWELLING, SOB, OR LIGHTHEADEDNESS WITH HYPOTENSION:  #  #  #  YES  #  #  #  Has patient had a PCN reaction causing severe rash involving mucus membranes or skin necrosis: No Has patient had a PCN reaction that required hospitalization No Has patient had a PCN reaction occurring within the last 10 years: No If all of the above answers are "NO", then may proceed with Cephalosporin use.   Captopril Cough   Simvastatin Other (See Comments)   MYALGIAS BUTTOCKS CRAMP   Adhesive [tape] Rash   Latex Rash   Patient states she gets red and a rash when latex touches her.      Medication List    TAKE these medications   amLODipine 10 MG  tablet Commonly known as:  NORVASC Take 1 tablet (10 mg total) by mouth daily. What changed:  See the new instructions.   aspirin EC 81 MG tablet Take 1 tablet (81 mg total) by mouth daily.   ciprofloxacin 500 MG tablet Commonly known as:  CIPRO Take 1 tablet (500 mg total) by mouth 2 (two) times daily.   diazepam 5 MG tablet Commonly known as:  VALIUM Take 1 tablet (5 mg total) by mouth at bedtime as needed. What changed:  reasons to take this   fexofenadine 180 MG tablet Commonly known as:  ALLEGRA Take 180 mg by mouth at bedtime.   FLUoxetine 10 MG capsule Commonly known as:  PROZAC Take 1 capsule (10 mg total) by mouth daily. What changed:  when to take this   furosemide 40 MG tablet Commonly known as:  LASIX TAKE 1/2 TO 1 TABLET DAILY AS NEEDED FOR FLUID What changed:  See the new instructions.   glipiZIDE 5 MG tablet Commonly known as:  GLUCOTROL TAKE 2 TABLETS IN AM AND 1 TAB IN PM What changed:  additional  instructions   labetalol 300 MG tablet Commonly known as:  NORMODYNE TAKE 1 TABLET TWICE DAILY.   meloxicam 7.5 MG tablet Commonly known as:  MOBIC Take 1 tablet (7.5 mg total) by mouth daily.   metFORMIN 500 MG tablet Commonly known as:  GLUCOPHAGE Take 1 tablet (500 mg total) by mouth 2 (two) times daily.   NASACORT ALLERGY 24HR NA Place 1 spray into the nose 2 (two) times daily as needed (congestion).   ONE TOUCH ULTRA 2 w/Device Kit Use as advised   ONE TOUCH ULTRA TEST test strip Generic drug:  glucose blood CHECK BLOOD SUGAR ONCE A DAY.   pravastatin 40 MG tablet Commonly known as:  PRAVACHOL TAKE 1 TABLET DAILY. What changed:  See the new instructions.   SALONPAS PAIN RELIEF PATCH EX Apply 1 patch topically daily as needed (back pain).   SUPER B COMPLEX PO Take 1 tablet by mouth daily.   traMADol 50 MG tablet Commonly known as:  ULTRAM Take 1 tablet (50 mg total) by mouth daily as needed for severe pain.   valsartan 320 MG tablet Commonly known as:  DIOVAN TAKE ONE TABLET AT BEDTIME.   Vitamin C 250 MG Chew Chew 500 mg by mouth daily.   Vitamin D3 2000 units Tabs Take 2,000 Units by mouth daily.   vitamin E 400 UNIT capsule Take 400 Units by mouth daily.       Follow Up Appointments: Follow-up Information    [name], MD Follow up.   Specialty:  Cardiothoracic Surgery Why:  PA/LAT CXR to be taken (at [facility] which is in the same building as Dr. Leonarda Salon office) one hour prior to office appointment;Office will call with appointment date and time. Contact information: [address] [place] [address] [place] [address] [phone]           Signed: Lars Pinks MPA-C 02/19/2017, 8:06 AM

## 2017-02-19 NOTE — Progress Notes (Signed)
Patient in a stable condition, discharge instruction completed with patient and husband at bedside, they verbalized understanding, paper prescriptions given, iv removed , tele dc ccmd notified,

## 2017-02-19 NOTE — Progress Notes (Addendum)
      [address]       [place] [address]             [phone]       3 Days Post-Op Procedure(s) (LRB): Right VIDEO ASSISTED THORACOSCOPY (VATS)/ Right Middle LOBECTOMY (Right)  Subjective: Patient without complaints this am. She had a bowel movement yesterday.  Objective: Vital signs in last 24 hours: Temp:  [98.2 F (36.8 C)] 98.2 F (36.8 C) (04/12 0601) Pulse Rate:  [37-109] 96 (04/12 0601) Cardiac Rhythm: Normal sinus rhythm (04/11 1900) Resp:  [16-22] 18 (04/12 0601) BP: (66-168)/(57-77) 158/59 (04/12 0601) SpO2:  [87 %-96 %] 90 % (04/12 0601) Weight:  [71.2 kg (157 lb)] 71.2 kg (157 lb) (04/11 1654)     Intake/Output from previous day: 04/11 0701 - 04/12 0700 In: 350 [P.O.:240; I.V.:110] Out: 3400 [Urine:3400]   Physical Exam:  Cardiovascular: RRR Pulmonary: Decreased at right base Wound: Clean and dry.  No erythema or signs of infection. Bruising proximal to wound  Lab Results: CBC: Recent Labs  02/18/17 0252 02/19/17 0227  WBC 16.5* 16.9*  HGB 10.6* 9.9*  HCT 33.9* 31.4*  PLT 325 317   BMET:  Recent Labs  02/18/17 0252 02/19/17 0227  NA 140 139  K 3.9 3.4*  CL 105 105  CO2 24 25  GLUCOSE 157* 123*  BUN 17 16  CREATININE 1.12* 1.15*  CALCIUM 8.7* 8.8*    PT/INR: No results for input(s): LABPROT, INR in the last 72 hours. ABG:  INR: Will add last result for INR, ABG once components are confirmed Will add last 4 CBG results once components are confirmed  Assessment/Plan:  1. CV - Hypertensive. On Labetalol 300 mg b id, Norvasc 5 mg bid, and Irbesartan 300 mg as taken pre op.  2.  Pulmonary - On 1 liter of oxygen via [place]. No CXR taken yet. Encourage incentive spirometer. 3. DM-CBGs 104/108/118. On Glipizide 5 mg at breakfast and 10 mg at supper, Metformin 500 mg bid, and Insulin. 4. Anemia-H and H 9.9 and 31.4 this am 5. Supplement potassium 6. Possible discharge later today  ZIMMERMAN,DONIELLE  MPA-C 02/19/2017,7:29 AM Patient seen and examined, agree with above CXR shows elevation of right hemidiaphragm- phrenic nerve was in close proximity to middle lobe vein and was preserved, should return to normal with time Dc home today  Remo Lipps C. Roxan Hockey, MD [facility] [phone]

## 2017-02-19 NOTE — Discharge Instructions (Signed)
Thoracotomy, Care After ° °This sheet gives you information about how to care for yourself after your procedure. Your doctor may also give you more specific instructions. If you have problems or questions, contact your doctor. °Follow these instructions at home: °Preventing lung infection (  °pneumonia) °· Take deep breaths or do breathing exercises as told by your doctor. °· Cough often. Coughing is important to clear thick spit (phlegm) and open your lungs. If coughing hurts, hold a pillow against your chest or place both hands flat on top of your cut (splinting) when you cough. This may help with discomfort. °· Use an incentive spirometer as told. This is a tool that measures how well you fill your lungs with each breath. °· Do lung therapy (pulmonary rehabilitation) as told. °Medicines °· Take over-the-counter or prescription medicines only as told by your doctor. °· If you have pain, take pain-relieving medicine before your pain gets very bad. This will help you breathe and cough more comfortably. °· If you were prescribed an antibiotic medicine, take it as told by your doctor. Do not stop taking the antibiotic even if you start to feel better. °Activity °· Ask your doctor what activities are safe for you. °· Do not travel by airplane for 2 weeks after your chest tube is removed, or until your doctor says that this is safe. °· Do not lift anything that is heavier than 10 lb (4.5 kg), or the limit that your doctor tells you, until he or she says that it is safe. °· Do not drive until your doctor approves. °¨ Do not drive or use heavy machinery while taking prescription pain medicine. °Incision care °· Follow instructions from your doctor about how to take care of your cut from surgery (incision). Make sure you: °¨ Wash your hands with soap and water before you change your bandage (dressing). If you cannot use soap and water, use hand sanitizer. °¨ Change your bandage as told by your doctor. °¨ Leave stitches  (sutures), skin glue, or skin tape (adhesive) strips in place. They may need to stay in place for 2 weeks or longer. If tape strips get loose and curl up, you may trim the loose edges. Do not remove tape strips completely unless your doctor says it is okay. °· Keep your bandage dry. °· Check your cut from surgery every day for signs of infection. Check for: °¨ More redness, swelling, or pain. °¨ More fluid or blood. °¨ Warmth. °¨ Pus or a bad smell. °Bathing °· Do not take baths, swim, or use a hot tub until your doctor approves. You may take showers. °· After your bandage has been removed, use soap and water to gently wash your cut from surgery. Do not use anything else to clean your cut unless your doctor tells you to. °Eating and drinking °· Eat a healthy diet as told by your doctor. A healthy diet includes: °¨ Fresh fruits and vegetables. °¨ Whole grains. °¨ Low-fat (lean) proteins. °· Drink enough fluid to keep your pee (urine) clear or pale yellow. °General instructions °· To prevent or treat trouble pooping (constipation) while you are taking prescription pain medicine, your doctor may recommend that you: °¨ Take over-the-counter or prescription medicines. °¨ Eat foods that are high in fiber. These include fresh fruits and vegetables, whole grains, and beans. °¨ Limit foods that are high in fat and processed sugars, such as fried and sweet foods. °· Do not use any products that contain nicotine or tobacco. These   include cigarettes and e-cigarettes. If you need help quitting, ask your doctor. °· Avoid secondhand smoke. °· Wear compression stockings as told. These help to prevent blood clots and reduce swelling in your legs. °· If you have a chest tube, care for it as told. °· Keep all follow-up visits as told by your doctor. This is important. °Contact a doctor if: °· You have more redness, swelling, or pain around your cut from surgery. °· You have more fluid or blood coming from your cut from  surgery. °· Your cut from surgery feels warm to the touch. °· You have pus or a bad smell coming from your cut from surgery. °· You have a fever or chills. °· Your heartbeat seems uneven. °· You feel sick to your stomach (nauseous). °· You throw up (vomit). °· You have muscle aches. °· You have trouble pooping (having a bowel movement). This may mean that you: °¨ Poop fewer times in a week than normal. °¨ Have a hard time pooping. °¨ Have poop that is dry, hard, or bigger than normal. °Get help right away if: °· You get a rash. °· You feel light-headed. °· You feel like you might pass out (faint). °· You are short of breath. °· You have trouble breathing. °· You are confused. °· You have trouble talking. °· You have problems with your seeing (vision). °· You are not able to move. °· You lose feeling (have numbness) in your: °¨ Face. °¨ Arms. °¨ Legs. °· You pass out. °· You have a sudden, bad headache. °· You feel weak. °· You have chest pain. °· You have pain that: °¨ Is very bad. °¨ Gets worse, even with medicine. °Summary °· Take deep breaths, do breathing exercises, and cough often. This helps prevent lung infection (pneumonia). °· Do not drive until your doctor approves. Do not travel by airplane for 2 weeks after your chest tube is removed, or until your doctor says that this is safe. °· Check your cut from surgery every day for signs of infection. °· Eat a healthy diet. This includes fresh fruits and vegetables, whole grains, and low-fat (lean) proteins. °This information is not intended to replace advice given to you by your health care provider. Make sure you discuss any questions you have with your health care provider. °Document Released: 04/27/2012 Document Revised: 07/21/2016 Document Reviewed: 07/21/2016 °Elsevier Interactive Patient Education © 2017 [employer] ° °

## 2017-02-19 NOTE — Consult Note (Signed)
[facility] CM Primary Care Navigator  02/19/2017  DEAUNDRA DUPRIEST Dec 15, 1940 [id number]   Met with patient and husband [name]) at the bedside to identify possible discharge needs. Patient reports she had been incidentally found to have a right middle lobe nodule and was very nervous about possible diagnosis of cancer that led to this admission/ surgery.  Patient endorses Dr. Billey Gosling with [place] [facility] as the primary care provider.   Patient shared using [place] [facility] to obtain medications without any problem.   Patient states she manages her own medications at home using "pill box" system weekly.   She is able to drive prior to admission. Her husband will provide transportation to her doctors' appointments after discharge.  Patient's husband will be the primary caregiver at home and daughter Tito Dine) can assist with her care if needed.    Anticipated discharge plan is home perpatient.   Patient and husband voiced understanding to call primary care provider's office when she gets home, for a post discharge follow-up appointment within a week or sooner if needed.Patient letter (with PCP's contact number) was provided as her reminder.  Explained to patient about [facility] CM services available for health management but both patient and husband denied any further needs at this time. She reports that COPD "has not been a problem at all". She states that DM is under control and well managed at home. She sees an endocrinologist (Dr. Cruzita Lederer) who helps manage and monitor it with recent A1c of 6.4.  [facility] care management contact information provided for future needs that may arise.    For questions, please contact:  Dannielle Huh, BSN, RN- [facility] Primary Care Navigator  Telephone: [phone] [place]

## 2017-02-19 NOTE — Care Management Note (Signed)
Case Management Note Marvetta Gibbons RN, BSN Unit 2W-Case Manager [phone]  Patient Details  Name: Sheila Melton MRN: [id number] Date of Birth: January 01, 1941  Subjective/Objective:  Pt admitted s/p VATS                  Action/Plan: PTA pt lived at home with spouse- independent- no CM needs noted for discharge.   Expected Discharge Date:  02/19/17               Expected Discharge Plan:  Home/Self Care  In-House Referral:     Discharge planning Services  CM Consult  Post Acute Care Choice:  NA Choice offered to:  NA  DME Arranged:    DME Agency:     HH Arranged:    HH Agency:     Status of Service:  Completed, signed off  If discussed at [employer] of Stay Meetings, dates discussed:    Additional Comments:  Dawayne Patricia, RN 02/19/2017, 2:13 PM

## 2017-02-19 NOTE — Progress Notes (Signed)
Patient education completed fro removal of pain pump, she verbalised understanding, ON Q removed as ordered, will continue to monitor.

## 2017-02-25 ENCOUNTER — Ambulatory Visit (INDEPENDENT_AMBULATORY_CARE_PROVIDER_SITE_OTHER): Payer: PPO | Admitting: Internal Medicine

## 2017-02-25 VITALS — BP 130/62 | HR 77 | Temp 98.5°F | Resp 16 | Wt 161.0 lb

## 2017-02-25 DIAGNOSIS — Z902 Acquired absence of lung [part of]: Secondary | ICD-10-CM

## 2017-02-25 DIAGNOSIS — I10 Essential (primary) hypertension: Secondary | ICD-10-CM

## 2017-02-25 DIAGNOSIS — N051 Unspecified nephritic syndrome with focal and segmental glomerular lesions: Secondary | ICD-10-CM

## 2017-02-25 DIAGNOSIS — B372 Candidiasis of skin and nail: Secondary | ICD-10-CM

## 2017-02-25 MED ORDER — NYSTATIN 100000 UNIT/GM EX CREA: 1.0000 "application " | TOPICAL_CREAM | Freq: Two times a day (BID) | CUTANEOUS | 3 refills | Status: DC

## 2017-02-25 NOTE — Patient Instructions (Addendum)
     Medications reviewed and updated.  Changes include starting nystatin for your rash.  Your prescription(s) have been submitted to your pharmacy. Please take as directed and contact our office if you believe you are having problem(s) with the medication(s).    Please followup in July as scheduled

## 2017-02-25 NOTE — Progress Notes (Signed)
Subjective:    Patient ID: Sheila Melton, female    DOB: Jun 28, 1941, 76 y.o.   MRN: [id number]  HPI The patient is here for follow up.  She was admitted 02/16/17 - 02/19/17 for Right middle lobe nodule.  She had a right video assisted thoracoscopy, right middle lobectomy.  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  Lymph nodes were all negative.  She has follow up with the surgeon scheduled.  She has mild tenderness in her chest wall.  She has mild shortness of breath.    Hematuria, renal stones:  She has seen urology and it was a Ct scan they had ordered that showed the incidental nodule in the lung.  She is following with urology.    She has a itchy, red rash under her breasts.   She wondered if she could put Desitin on it.    Hypertension: She is taking her medication daily. She is compliant with a low sodium diet.  She denies chest pain, edema and regular headaches.     Chronic kidney disease, FSGS:  She is following with nephrology. Her kidney function has gotten a little worse. She will need to go back on medication for the FSGS and has a follow up with nephrology.   Medications and allergies reviewed with patient and updated if appropriate.  Patient Active Problem List   Diagnosis Date Noted  . 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 Osteoporosis 12/25/2016  . Solitary pulmonary nodule 12/19/2016  . Depression 08/15/2016  . Easy bruising 08/15/2016  . 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 CKD (chronic kidney disease) 05/21/2016  . Sleep difficulties 01/18/2016  . Diabetes mellitus ([place]) 10/23/2015  . Numbness of toes-Right foot 06/26/2015  . Proteinuria 03/26/2015  . Colitis 03/19/2015  . Nephrolithiasis 03/19/2015  . AAA (abdominal aortic aneurysm) without rupture ([place]) 03/19/2015  . HLD (hyperlipidemia) 03/19/2015  . Anxiety state 03/19/2015  . Skin cancer 01/09/2015  . Former smoker 05/24/2014  . Bilateral renal cysts  09/16/2012  . 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 Degenerative spondylolisthesis 07/26/2012  . CHRONIC OBSTRUCTIVE PULMONARY DISEASE, ACUTE EXACERBATION 09/04/2010  . SHOULDER PAIN, LEFT, CHRONIC 07/25/2009  . MUSCLE PAIN 06/11/2009  . CARDIAC MURMUR, SYSTOLIC 23/34/3568  . Mixed hyperlipidemia 12/30/2007  . Tobacco use disorder 12/30/2007  . Essential hypertension 12/30/2007  . Subclavian steal syndrome 12/30/2007  . Palpitations 12/30/2007  . CAROTID BRUIT 04/07/2007    Current Outpatient Prescriptions on File Prior to Visit  Medication Sig Dispense Refill  . amLODipine (NORVASC) 10 MG tablet Take 1 tablet (10 mg total) by mouth daily. 30 tablet 1  . Ascorbic Acid (VITAMIN C) 250 MG CHEW Chew 500 mg by mouth daily.    Marland Kitchen aspirin EC 81 MG tablet Take 1 tablet (81 mg total) by mouth daily. 90 tablet 3  . B Complex-C (SUPER B COMPLEX PO) Take 1 tablet by mouth daily.     . Blood Glucose Monitoring Suppl (ONE TOUCH ULTRA 2) w/Device KIT Use as advised 1 each 0  . Cholecalciferol (VITAMIN D3) 2000 UNITS TABS Take 2,000 Units by mouth daily.     . diazepam (VALIUM) 5 MG tablet Take 1 tablet (5 mg total) by mouth at bedtime as needed. (Patient taking differently: Take 5 mg by mouth at bedtime as needed for sedation. ) 90 tablet 0  . fexofenadine (ALLEGRA) 180 MG tablet Take 180 mg by mouth at bedtime.     Marland Kitchen  FLUoxetine (PROZAC) 10 MG capsule Take 1 capsule (10 mg total) by mouth daily. (Patient taking differently: Take 10 mg by mouth at bedtime. ) 90 capsule 3  . furosemide (LASIX) 40 MG tablet TAKE 1/2 TO 1 TABLET DAILY AS NEEDED FOR FLUID (Patient taking differently: take 39ms by mouth every morning) 90 tablet 3  . glipiZIDE (GLUCOTROL) 5 MG tablet TAKE 2 TABLETS IN AM AND 1 TAB IN PM (Patient taking differently: Take 557min the morning and 1010mat night) 270 tablet 3  . labetalol (NORMODYNE) 300 MG tablet TAKE 1 TABLET TWICE DAILY. 180 tablet 3  .  Liniments (SALONPAS PAIN RELIEF PATCH EX) Apply 1 patch topically daily as needed (back pain).    . meloxicam (MOBIC) 7.5 MG tablet Take 1 tablet (7.5 mg total) by mouth daily. 90 tablet 1  . metFORMIN (GLUCOPHAGE) 500 MG tablet Take 1 tablet (500 mg total) by mouth 2 (two) times daily. 180 tablet 3  . ONE TOUCH ULTRA TEST test strip CHECK BLOOD SUGAR ONCE A DAY. 100 each 0  . pravastatin (PRAVACHOL) 40 MG tablet TAKE 1 TABLET DAILY. (Patient taking differently: TAKE 1 TABLET DAILY AT BEDTIME) 90 tablet 3  . traMADol (ULTRAM) 50 MG tablet Take 1 tablet (50 mg total) by mouth daily as needed for severe pain. 30 tablet 0  . Triamcinolone Acetonide (NASACORT ALLERGY 24HR NA) Place 1 spray into the nose 2 (two) times daily as needed (congestion).    . valsartan (DIOVAN) 320 MG tablet TAKE ONE TABLET AT BEDTIME. 90 tablet 3  . vitamin E 400 UNIT capsule Take 400 Units by mouth daily.     No current facility-administered medications on file prior to visit.     Past Medical History:  Diagnosis Date  . AAA (abdominal aortic aneurysm) (HCC)    BEING WATCHED   VVS. 4.2 cm infrarenal abdominal aortic aneurysm without rupture noted 11/08/2016  . Arthritis   . [place]  skin cancer removed from left shoulder  2017  . CAP (community acquired pneumonia) 09/02/13-11/05/13    with SIRS  . Depression    ?? from prednisone    not on pred now  . Diabetes mellitus    type ii    long time ago.  . Family history of anesthesia complication    PATIENTS MOM HAD TROUBLE WAKING UP   . GERD (gastroesophageal reflux disease)    will take occasional tums  . Heart murmur    still has.   Sees Dr. CreStanford Breed History of kidney stones    has them at the present time  . Hyperlipidemia   . Hypertension    unspecified essential  . Renal insufficiency    "FSGS"  . Subclavian steal syndrome    LEFT SIDE   NO SIDE EFFECTS  . Subclavian steal syndrome --  left    85% blocked  . UTI (lower urinary tract  infection) 08/26/13   Enterococcus and Escherichia coli both sensitive to nitrofurantoin    Past Surgical History:  Procedure Laterality Date  . ABDOMINAL HYSTERECTOMY    . BACK SURGERY    . CATARACT EXTRACTION     OS  . CYST EXCISION Right 05-23-13   Right index finger: cyst  . DILATION AND CURETTAGE OF UTERUS    . EYE SURGERY    . g1 p1    . SPINE SURGERY  Sept. 2013  . VIDEO ASSISTED THORACOSCOPY (VATS)/ LOBECTOMY Right 02/16/2017   Procedure: Right  VIDEO ASSISTED THORACOSCOPY (VATS)/ Right Middle LOBECTOMY;  Surgeon: [name], MD;  Location: [facility];  Service: Thoracic;  Laterality: Right;    Social History   Social History  . Marital status: Married    Spouse name: N/A  . Number of children: 1  . Years of education: N/A   Social History Main Topics  . Smoking status: Current Some Day Smoker    Packs/day: 1.00    Years: 50.00    Types: E-cigarettes  . Smokeless tobacco: Never Used     Comment: e-cigs  . Alcohol use No  . Drug use: No  . Sexual activity: Not on file   Other Topics Concern  . Not on file   Social History Narrative   Has 3 caffeinated bev a day      Exercise: none    Family History  Problem Relation Age of Onset  . Heart failure Mother   . Heart disease Mother   . Hypertension Mother   . Other Mother     varicose veins  . Deep vein thrombosis Mother   . Varicose Veins Mother   . Heart attack Mother   . Hypertension Sister     X 2  . Diabetes Sister   . Hyperlipidemia Sister   . Other Sister     varicose veins  . Heart disease Father   . Diabetes Father   . Hyperlipidemia Father   . Hypertension Father   . Heart attack Father   . Heart disease Brother     MI in late 58s  . Hyperlipidemia Brother   . Heart attack Brother   . Coronary artery disease    . Diabetes    . Parkinsonism Brother   . Heart failure Brother   . Hypertension Daughter     Review of Systems  Constitutional: Positive for diaphoresis (two  episodes  - ? withdrawl from pain medication givne in hospital). Negative for chills and fever.  HENT: Positive for voice change (mild hoarseness).   Respiratory: Positive for shortness of breath (mild SOB ). Negative for cough and wheezing.   Cardiovascular: Positive for palpitations (a few times a few days ago - transient). Negative for chest pain and leg swelling.  Skin: Positive for rash (under breasts, itchy).  Neurological: Positive for dizziness (intermittent). Negative for light-headedness and headaches.       Objective:   Vitals:   02/25/17 1302  BP: 130/62  Pulse: 77  Resp: 16  Temp: 98.5 F (36.9 C)   Wt Readings from Last 3 Encounters:  02/25/17 161 lb (73 kg)  02/18/17 157 lb (71.2 kg)  02/12/17 159 lb (72.1 kg)   Body mass index is 27.64 kg/m.   Physical Exam    Constitutional: Appears well-developed and well-nourished. No distress.  HENT:  Head: Normocephalic and atraumatic.  Neck: Neck supple. No tracheal deviation present. No thyromegaly present.  No cervical lymphadenopathy Cardiovascular: Normal rate, regular rhythm and normal heart sounds.   No murmur heard. No carotid bruit .  No edema Pulmonary/Chest: Bruising on right lateral chest  - incisions healing well,  Effort normal and breath sounds normal. No respiratory distress. No has no wheezes. No rales.  Skin: Skin is warm and dry. Not diaphoretic.  Psychiatric: Normal mood and affect. Behavior is normal.      Assessment & Plan:    See Problem List for Assessment and Plan of chronic medical problems.

## 2017-02-25 NOTE — Progress Notes (Signed)
Pre visit review using our clinic review tool, if applicable. No additional management support is needed unless otherwise documented below in the visit note. 

## 2017-02-26 ENCOUNTER — Encounter: Payer: Self-pay | Admitting: Internal Medicine

## 2017-02-26 DIAGNOSIS — B372 Candidiasis of skin and nail: Secondary | ICD-10-CM | POA: Insufficient documentation

## 2017-02-26 NOTE — Assessment & Plan Note (Signed)
BP well controlled Current regimen effective and well tolerated Continue current medications at current doses  

## 2017-02-26 NOTE — Assessment & Plan Note (Signed)
Will follow up with nephology - deciding on medication to take for FSGS

## 2017-02-26 NOTE — Assessment & Plan Note (Signed)
Rash under breasts  Consistent with yeast infection Nystatin cream bid

## 2017-02-26 NOTE — Assessment & Plan Note (Signed)
Recovering well Has pulmonary and surgery follow up

## 2017-03-02 ENCOUNTER — Other Ambulatory Visit: Payer: Self-pay | Admitting: Thoracic Surgery (Cardiothoracic Vascular Surgery)

## 2017-03-02 DIAGNOSIS — Z902 Acquired absence of lung [part of]: Secondary | ICD-10-CM

## 2017-03-03 ENCOUNTER — Ambulatory Visit (INDEPENDENT_AMBULATORY_CARE_PROVIDER_SITE_OTHER): Payer: Self-pay | Admitting: Thoracic Surgery (Cardiothoracic Vascular Surgery)

## 2017-03-03 ENCOUNTER — Ambulatory Visit
Admission: RE | Admit: 2017-03-03 | Discharge: 2017-03-03 | Disposition: A | Discharge status: Home / Self Care | Payer: PPO | Source: Ambulatory Visit | Attending: Thoracic Surgery (Cardiothoracic Vascular Surgery) | Admitting: Thoracic Surgery (Cardiothoracic Vascular Surgery)

## 2017-03-03 ENCOUNTER — Encounter: Payer: Self-pay | Admitting: Thoracic Surgery (Cardiothoracic Vascular Surgery)

## 2017-03-03 VITALS — BP 104/59 | Resp 16 | Ht 63.5 in | Wt 161.0 lb

## 2017-03-03 DIAGNOSIS — Z09 Encounter for follow-up examination after completed treatment for conditions other than malignant neoplasm: Secondary | ICD-10-CM

## 2017-03-03 DIAGNOSIS — Q859 Phakomatosis, unspecified: Secondary | ICD-10-CM

## 2017-03-03 DIAGNOSIS — Z902 Acquired absence of lung [part of]: Secondary | ICD-10-CM

## 2017-03-03 DIAGNOSIS — Z8679 Personal history of other diseases of the circulatory system: Secondary | ICD-10-CM | POA: Diagnosis not present

## 2017-03-03 NOTE — Progress Notes (Signed)
[address]       [place] [address]             [phone]    HPI: Sheila Melton returns for a scheduled postoperative follow-up visit.  Sheila Melton is a 76 year old woman who was found to have a 10 x 13 mm nodule in the right middle lobe on a CT of the abdomen and pelvis. On PET/CT the nodules mildly hypermetabolic with an SUV of 1.5. She preferred surgical resection of her radiographic follow-up. I did a thoracoscopic right middle lobectomy on 02/16/2017. She went home on postoperative day #3. 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  She complains of pain under her right breast. She developed a rash many yeast infection under both breasts from being on ciprofloxacin but does not have pain on the left side. She does not have any shortness of breath. Overall she feels well  Past Medical History:  Diagnosis Date  . AAA (abdominal aortic aneurysm) (HCC)    BEING WATCHED   VVS. 4.2 cm infrarenal abdominal aortic aneurysm without rupture noted 11/08/2016  . Arthritis   . Cancer ([place])    skin cancer removed from left shoulder  2017  . CAP (community acquired pneumonia) 09/02/13-11/05/13    with SIRS  . Depression    ?? from prednisone    not on pred now  . Diabetes mellitus    type ii    long time ago.  . Family history of anesthesia complication    PATIENTS MOM HAD TROUBLE WAKING UP   . GERD (gastroesophageal reflux disease)    will take occasional tums  . Heart murmur    still has.   Sees Dr. Stanford Breed  . History of kidney stones    has them at the present time  . Hyperlipidemia   . Hypertension    unspecified essential  . Renal insufficiency    "FSGS"  . Subclavian steal syndrome    LEFT SIDE   NO SIDE EFFECTS  . Subclavian steal syndrome --  left    85% blocked  . UTI (lower urinary tract infection) 08/26/13   Enterococcus and Escherichia coli both sensitive to nitrofurantoin    Current Outpatient Prescriptions  Medication Sig Dispense  Refill  . amLODipine (NORVASC) 10 MG tablet Take 1 tablet (10 mg total) by mouth daily. 30 tablet 1  . Ascorbic Acid (VITAMIN C) 250 MG CHEW Chew 500 mg by mouth daily.    Marland Kitchen aspirin EC 81 MG tablet Take 1 tablet (81 mg total) by mouth daily. 90 tablet 3  . B Complex-C (SUPER B COMPLEX PO) Take 1 tablet by mouth daily.     . Blood Glucose Monitoring Suppl (ONE TOUCH ULTRA 2) w/Device KIT Use as advised 1 each 0  . Cholecalciferol (VITAMIN D3) 2000 UNITS TABS Take 2,000 Units by mouth daily.     . diazepam (VALIUM) 5 MG tablet Take 1 tablet (5 mg total) by mouth at bedtime as needed. (Patient taking differently: Take 5 mg by mouth at bedtime as needed for sedation. ) 90 tablet 0  . fexofenadine (ALLEGRA) 180 MG tablet Take 180 mg by mouth at bedtime.     Marland Kitchen FLUoxetine (PROZAC) 10 MG capsule Take 1 capsule (10 mg total) by mouth daily. (Patient taking differently: Take 10 mg by mouth at bedtime. ) 90 capsule 3  . furosemide (LASIX) 40 MG tablet TAKE 1/2 TO 1 TABLET DAILY AS NEEDED FOR  FLUID (Patient taking differently: take '40mg'$ s by mouth every morning) 90 tablet 3  . glipiZIDE (GLUCOTROL) 5 MG tablet TAKE 2 TABLETS IN AM AND 1 TAB IN PM (Patient taking differently: Take '5mg'$  in the morning and '10mg'$ s at night) 270 tablet 3  . labetalol (NORMODYNE) 300 MG tablet TAKE 1 TABLET TWICE DAILY. 180 tablet 3  . Liniments (SALONPAS PAIN RELIEF PATCH EX) Apply 1 patch topically daily as needed (back pain).    . meloxicam (MOBIC) 7.5 MG tablet Take 1 tablet (7.5 mg total) by mouth daily. 90 tablet 1  . metFORMIN (GLUCOPHAGE) 500 MG tablet Take 1 tablet (500 mg total) by mouth 2 (two) times daily. 180 tablet 3  . nystatin cream (MYCOSTATIN) Apply 1 application topically 2 (two) times daily. 30 g 3  . ONE TOUCH ULTRA TEST test strip CHECK BLOOD SUGAR ONCE A DAY. 100 each 0  . pravastatin (PRAVACHOL) 40 MG tablet TAKE 1 TABLET DAILY. (Patient taking differently: TAKE 1 TABLET DAILY AT BEDTIME) 90 tablet 3  .  traMADol (ULTRAM) 50 MG tablet Take 1 tablet (50 mg total) by mouth daily as needed for severe pain. 30 tablet 0  . Triamcinolone Acetonide (NASACORT ALLERGY 24HR NA) Place 1 spray into the nose 2 (two) times daily as needed (congestion).    . valsartan (DIOVAN) 320 MG tablet TAKE ONE TABLET AT BEDTIME. 90 tablet 3  . vitamin E 400 UNIT capsule Take 400 Units by mouth daily.     No current facility-administered medications for this visit.     Physical Exam BP (!) 104/59 (BP Location: Right Arm, Patient Position: Sitting, Cuff Size: Large)   Resp 16   Ht 5' 3.5" (1.613 m)   Wt 161 lb (73 kg)   SpO2 94% Comment: ON RA  BMI 28.68 kg/m  76 year old woman in no acute distress Alert and oriented 3 no focal deficits Lungs diminished the right base, otherwise clear Cardiac regular rate and rhythm normal S1 and S2 Right chest incisions well healed  Diagnostic Tests: CHEST  2 VIEW  COMPARISON:  02/19/2017  FINDINGS: Cardiac silhouette is normal size. No mediastinal or hilar masses. No evidence of adenopathy.  There is elevation right hemidiaphragm stable from the most recent prior study. Pulmonary anastomosis staple lines extend along the inferior right hilum. There is opacity extends inferiorly and antral laterally from the anastomosis staples, most likely atelectasis. This is also unchanged. Remainder of the lungs is clear. No pleural effusion. No pneumothorax.  Skeletal structures are unremarkable.  IMPRESSION: 1. No acute cardiopulmonary disease. 2. Postsurgical changes on the right, with right-sided volume loss, side hemidiaphragm elevation, and stable atelectasis at the right lung base. No change from the prior exam.   Electronically Signed   By: Lajean Manes M.D.   On: 03/03/2017 14:32 I personally reviewed the chest x-ray and concur with the findings noted above  Impression: Sheila Melton is a 76 year old woman who had a right middle lobectomy for a 1.3 cm  nodule that was mildly hypermetabolic on PET/CT. 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  Yeast infection - she is using topical nystatin for that   Postoperative pain - still using tramadol. She is not aware brought this point. Her pain should improve significantly over the next several weeks.  Elevated hemidiaphragm - referring nerve was in close proximity to the hilum, but was preserved. That function should return with time but may take several months.   She may begin driving when she is  no longer having as much pain and does not need to take the tramadol during the day.  There are no other restrictions on her activities but she should build into new activities gradually.  Plan:  Return in 6 months with PA and lateral chest x-ray   [name], MD [facility] [phone]

## 2017-03-26 DIAGNOSIS — L814 Other melanin hyperpigmentation: Secondary | ICD-10-CM | POA: Diagnosis not present

## 2017-03-26 DIAGNOSIS — L821 Other seborrheic keratosis: Secondary | ICD-10-CM | POA: Diagnosis not present

## 2017-03-26 DIAGNOSIS — L304 Erythema intertrigo: Secondary | ICD-10-CM | POA: Diagnosis not present

## 2017-03-26 DIAGNOSIS — D225 Melanocytic nevi of trunk: Secondary | ICD-10-CM | POA: Diagnosis not present

## 2017-03-26 DIAGNOSIS — Z85828 Personal history of other malignant neoplasm of skin: Secondary | ICD-10-CM | POA: Diagnosis not present

## 2017-03-26 DIAGNOSIS — D1801 Hemangioma of skin and subcutaneous tissue: Secondary | ICD-10-CM | POA: Diagnosis not present

## 2017-04-07 DIAGNOSIS — D631 Anemia in chronic kidney disease: Secondary | ICD-10-CM | POA: Diagnosis not present

## 2017-04-07 DIAGNOSIS — N183 Chronic kidney disease, stage 3 (moderate): Secondary | ICD-10-CM | POA: Diagnosis not present

## 2017-04-07 DIAGNOSIS — N2581 Secondary hyperparathyroidism of renal origin: Secondary | ICD-10-CM | POA: Diagnosis not present

## 2017-04-13 DIAGNOSIS — I129 Hypertensive chronic kidney disease with stage 1 through stage 4 chronic kidney disease, or unspecified chronic kidney disease: Secondary | ICD-10-CM | POA: Diagnosis not present

## 2017-04-13 DIAGNOSIS — N051 Unspecified nephritic syndrome with focal and segmental glomerular lesions: Secondary | ICD-10-CM | POA: Diagnosis not present

## 2017-04-13 DIAGNOSIS — D631 Anemia in chronic kidney disease: Secondary | ICD-10-CM | POA: Diagnosis not present

## 2017-04-13 DIAGNOSIS — N183 Chronic kidney disease, stage 3 (moderate): Secondary | ICD-10-CM | POA: Diagnosis not present

## 2017-04-13 NOTE — Addendum Note (Signed)
Addendum  created 04/13/17 1329 by Oleta Mouse, MD   Sign clinical note

## 2017-04-15 ENCOUNTER — Other Ambulatory Visit: Payer: Self-pay | Admitting: Internal Medicine

## 2017-04-15 NOTE — Telephone Encounter (Signed)
Please advise, shouldn't be due until aug.

## 2017-04-18 ENCOUNTER — Ambulatory Visit (INDEPENDENT_AMBULATORY_CARE_PROVIDER_SITE_OTHER): Payer: PPO | Admitting: Family Medicine

## 2017-04-18 ENCOUNTER — Encounter: Payer: Self-pay | Admitting: Family Medicine

## 2017-04-18 VITALS — BP 122/66 | HR 88 | Temp 98.4°F | Wt 158.4 lb

## 2017-04-18 DIAGNOSIS — J019 Acute sinusitis, unspecified: Secondary | ICD-10-CM

## 2017-04-18 MED ORDER — AZITHROMYCIN 250 MG PO TABS: ORAL_TABLET | ORAL | 0 refills | Status: DC

## 2017-04-18 NOTE — Progress Notes (Signed)
   Subjective:    Patient ID: Sheila Melton, female    DOB: 07/12/1941, 76 y.o.   MRN: [id number]  HPI Here for one week of sinus pressure, PND, and a dry cough. On Mucinex.    Review of Systems  Constitutional: Negative.   HENT: Positive for congestion, postnasal drip, sinus pain and sinus pressure. Negative for sore throat.   Eyes: Negative.   Respiratory: Positive for choking.        Objective:   Physical Exam  Constitutional: She appears well-developed and well-nourished.  HENT:  Right Ear: External ear normal.  Left Ear: External ear normal.  Nose: Nose normal.  Mouth/Throat: Oropharynx is clear and moist.  Eyes: Conjunctivae are normal.  Neck: No thyromegaly present.  Pulmonary/Chest: Effort normal and breath sounds normal. No respiratory distress. She has no wheezes. She has no rales.  Lymphadenopathy:    She has no cervical adenopathy.          Assessment & Plan:  Sinusitis, treat with a Zpack.  Alysia Penna, MD

## 2017-04-18 NOTE — Progress Notes (Signed)
Pre visit review using our clinic review tool, if applicable. No additional management support is needed unless otherwise documented below in the visit note. 

## 2017-04-20 ENCOUNTER — Other Ambulatory Visit: Payer: Self-pay | Admitting: Internal Medicine

## 2017-04-21 ENCOUNTER — Other Ambulatory Visit: Payer: Self-pay | Admitting: Emergency Medicine

## 2017-04-21 ENCOUNTER — Encounter: Payer: Self-pay | Admitting: Internal Medicine

## 2017-04-21 ENCOUNTER — Ambulatory Visit (INDEPENDENT_AMBULATORY_CARE_PROVIDER_SITE_OTHER): Payer: PPO | Admitting: Internal Medicine

## 2017-04-21 VITALS — BP 138/42 | HR 90 | Ht 63.5 in | Wt 158.0 lb

## 2017-04-21 DIAGNOSIS — E1159 Type 2 diabetes mellitus with other circulatory complications: Secondary | ICD-10-CM

## 2017-04-21 LAB — POCT GLYCOSYLATED HEMOGLOBIN (HGB A1C): Hemoglobin A1C: 6.2

## 2017-04-21 NOTE — Addendum Note (Signed)
Addended by: Caprice Beaver T on: 04/21/2017 02:29 PM   Modules accepted: Orders

## 2017-04-21 NOTE — Patient Instructions (Addendum)
Please continue: - Glipizide to 5 mg before b'fast and 10 mg before dinner. - Metformin 500 mg daily 2x a day  Please return in 6 months with your sugar log.

## 2017-04-21 NOTE — Telephone Encounter (Signed)
RX faxed to [facility] 

## 2017-04-21 NOTE — Progress Notes (Signed)
Patient ID: Sheila Melton, female   DOB: 1941-04-02, 77 y.o.   MRN: [id number]  HPI: Sheila Melton is a 75 y.o.-year-old female, returning for follow-up for  DM2, dx in ~2006, non-insulin-dependent, controlled, with complications (stage 3 CKD, circulatory complications - AAA). Last visit 3 months ago.  Since last visit, she had surgery for her lung nodule. 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  Last hemoglobin A1c was: Lab Results  Component Value Date   HGBA1C 6.4 01/20/2017   HGBA1C 6.6 08/25/2016   HGBA1C 7.2 04/29/2016   HGBA1C 6.9 12/31/2015   HGBA1C 6.9 (H) 09/26/2015   HGBA1C 6.5 02/23/2015   HGBA1C 6.9 (H) 11/16/2014   HGBA1C 8.1 (H) 05/09/2014   HGBA1C 7.5 (H) 12/19/2013   HGBA1C 7.6 (H) 08/24/2013   HGBA1C 7.0 (H) 04/13/2013   HGBA1C 7.5 (H) 12/03/2012   HGBA1C 6.2 09/07/2012   HGBA1C 7.1 (H) 06/29/2012   HGBA1C 6.6 (H) 04/09/2012   Pt is on a regimen of: - Metformin 500 mg daily 2x a day - Glipizide 5 mg in am and 10 mg in pm  We stopped Actos 15 mg daily in 10/2015 as she felt that she gained 20 pounds after she started Actos. She was previously on metformin, but this was stopped due to her CKD. She was cleared by her nephrologist, Dr. Posey Pronto to restart metformin.   Pt checks her sugars Once a day: - am: 102-130, 158 - in last 2 weeks >> 122- 153, 171  (new meter) >> 123-161, 177 - 2h after b'fast: 80-120 >> n/c >> 111-142, 154 >> 125-161 >> 145-202 >> 159 - before lunch: 120s >> 69-136, 216x1 >> 234 x1 >> 171, 180 >> 260 >> 101 - 2h after lunch:  127 >> 133, 149 in last 2 weeks >> 116-186, 203 >> 93, 142, 197 - before dinner: 118-135, 174 >> 150-180 >> n/c >> 137, 183 >> 80 >> n/c  - 2h after dinner: 124-225 >> 96-162, 220 >> 145, 151, 226 >> 169 - bedtime: n/c >> 128-164 >> n/c - nighttime: n/c No lows. Lowest sugar was 86 >> 80 >> 106 >> 83; ? hypoglycemia awareness.  Highest sugar was 297 >> 254 >> 260 >> 197.  Glucometer: One Touch   Pt's meals are: -  Breakfast: cereals, bacon + eggs - Lunch: cheese crackers - Dinner: meat + veggies - Snacks: 1 or 2  - + CKD stage 3, last BUN/creatinine:  04/07/2017: 18/1.21, GFR 44 Lab Results  Component Value Date   BUN 16 02/19/2017   CREATININE 1.15 (H) 02/19/2017  On Diovan. - last set of lipids: Lab Results  Component Value Date   CHOL 245 (H) 09/26/2015   HDL 40.90 09/26/2015   LDLCALC 123 (H) 05/24/2014   LDLDIRECT 118.0 09/26/2015   TRIG 322.0 (H) 09/26/2015   CHOLHDL 6 09/26/2015  On pravastatin. - last eye exam was 04/2016. No DR. Dr. Idolina Primer.  - She denies numbness and tingling in her feet.  She also has HTN, HL. 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  ROS: Constitutional: no weight gain/no weight loss, no fatigue, no subjective hyperthermia, no subjective hypothermia Eyes: no blurry vision, no xerophthalmia ENT: no sore throat, no nodules palpated in throat, no dysphagia, no odynophagia, no hoarseness Cardiovascular: no CP/no SOB/no palpitations/no leg swelling Respiratory: no cough/no SOB/no wheezing Gastrointestinal: no N/no V/no D/no C/no acid reflux  Musculoskeletal: no muscle aches/no joint aches Skin: no rashes, no hair loss Neurological: no tremors/no numbness/no tingling/no dizziness  I reviewed pt's medications, allergies, PMH, social hx, family hx, and changes were documented in the history of present illness. Otherwise, unchanged from my initial visit note.   Past Medical History:  Diagnosis Date  . AAA (abdominal aortic aneurysm) (HCC)    BEING WATCHED   VVS. 4.2 cm infrarenal abdominal aortic aneurysm without rupture noted 11/08/2016  . Arthritis   . Cancer ([place])    skin cancer removed from left shoulder  2017  . CAP (community acquired pneumonia) 09/02/13-11/05/13    with SIRS  . Depression    ?? from prednisone    not on pred now  . Diabetes mellitus    type ii    long time  ago.  . Family history of anesthesia complication    PATIENTS MOM HAD TROUBLE WAKING UP   . GERD (gastroesophageal reflux disease)    will take occasional tums  . Heart murmur    still has.   Sees Dr. Stanford Breed  . History of kidney stones    has them at the present time  . Hyperlipidemia   . Hypertension    unspecified essential  . Renal insufficiency    "FSGS"  . Subclavian steal syndrome    LEFT SIDE   NO SIDE EFFECTS  . Subclavian steal syndrome --  left    85% blocked  . UTI (lower urinary tract infection) 08/26/13   Enterococcus and Escherichia coli both sensitive to nitrofurantoin   Past Surgical History:  Procedure Laterality Date  . ABDOMINAL HYSTERECTOMY    . BACK SURGERY    . CATARACT EXTRACTION     OS  . CYST EXCISION Right 05-23-13   Right index finger: cyst  . DILATION AND CURETTAGE OF UTERUS    . EYE SURGERY    . g1 p1    . SPINE SURGERY  Sept. 2013  . VIDEO ASSISTED THORACOSCOPY (VATS)/ LOBECTOMY Right 02/16/2017   Procedure: Right VIDEO ASSISTED THORACOSCOPY (VATS)/ Right Middle LOBECTOMY;  Surgeon: [name], MD;  Location: [facility];  Service: Thoracic;  Laterality: Right;   Social History   Social History  . Marital Status: Married    Spouse Name: N/A  . Number of Children: 1   Occupational History  . retied   Social History Main Topics  . Smoking status: Current Some Day Smoker -- 1.00 packs/day for 50 years    Types: E-cigarettes  . Smokeless tobacco: Never Used  . Alcohol Use: No  . Drug Use: No  . Sexual Activity: Not on file   Other Topics Concern  . Not on file   Social History Narrative   Has 3 caffeinated bev a day   Current Outpatient Prescriptions on File Prior to Visit  Medication Sig Dispense Refill  . amLODipine (NORVASC) 10 MG tablet Take 1 tablet (10 mg total) by mouth daily. 30 tablet 1  . Ascorbic Acid (VITAMIN C) 250 MG CHEW Chew 500 mg by mouth daily.    Marland Kitchen aspirin EC 81 MG tablet Take 1 tablet (81 mg total) by  mouth daily. 90 tablet 3  . azithromycin (ZITHROMAX) 250 MG tablet As directed 6 tablet 0  . B Complex-C (SUPER B COMPLEX PO) Take 1 tablet by mouth daily.     . Blood Glucose Monitoring Suppl (ONE TOUCH ULTRA 2) w/Device KIT Use as advised 1 each 0  . Cholecalciferol (VITAMIN D3) 2000  UNITS TABS Take 2,000 Units by mouth daily.     . diazepam (VALIUM) 5 MG tablet TAKE 1 TABLET AT BEDTIME AS NEEDED. 90 tablet 0  . fexofenadine (ALLEGRA) 180 MG tablet Take 180 mg by mouth at bedtime.     Marland Kitchen FLUoxetine (PROZAC) 10 MG capsule Take 1 capsule (10 mg total) by mouth daily. (Patient taking differently: Take 10 mg by mouth at bedtime. ) 90 capsule 3  . furosemide (LASIX) 40 MG tablet TAKE 1/2 TO 1 TABLET DAILY AS NEEDED FOR FLUID (Patient taking differently: take '40mg'$ s by mouth every morning) 90 tablet 3  . glipiZIDE (GLUCOTROL) 5 MG tablet TAKE 2 TABLETS IN AM AND 1 TAB IN PM (Patient taking differently: Take '5mg'$  in the morning and '10mg'$ s at night) 270 tablet 3  . labetalol (NORMODYNE) 300 MG tablet TAKE 1 TABLET TWICE DAILY. 180 tablet 3  . Liniments (SALONPAS PAIN RELIEF PATCH EX) Apply 1 patch topically daily as needed (back pain).    . meloxicam (MOBIC) 7.5 MG tablet TAKE 1 TABLET ONCE DAILY. 90 tablet 0  . metFORMIN (GLUCOPHAGE) 500 MG tablet TAKE 1 TABLET TWICE DAILY. 180 tablet 0  . nystatin cream (MYCOSTATIN) Apply 1 application topically 2 (two) times daily. 30 g 3  . ONE TOUCH ULTRA TEST test strip CHECK BLOOD SUGAR ONCE A DAY. 100 each 0  . pravastatin (PRAVACHOL) 40 MG tablet TAKE 1 TABLET DAILY. (Patient taking differently: TAKE 1 TABLET DAILY AT BEDTIME) 90 tablet 3  . traMADol (ULTRAM) 50 MG tablet Take 1 tablet (50 mg total) by mouth daily as needed for severe pain. 30 tablet 0  . Triamcinolone Acetonide (NASACORT ALLERGY 24HR NA) Place 1 spray into the nose 2 (two) times daily as needed (congestion).    . valsartan (DIOVAN) 320 MG tablet TAKE ONE TABLET AT BEDTIME. 90 tablet 3  . vitamin  E 400 UNIT capsule Take 400 Units by mouth daily.     No current facility-administered medications on file prior to visit.    Allergies  Allergen Reactions  . Rocephin [Ceftriaxone Sodium In Dextrose] Dermatitis and Rash    10/24 /14 blisters of palms & diffuse rash Because of a history of documented adverse serious drug reaction;Medi Alert bracelet  is recommended  . Amoxicillin Rash    Has patient had a PCN reaction causing IMMEDIATE RASH, FACIAL/TONGUE/THROAT SWELLING, SOB, OR LIGHTHEADEDNESS WITH HYPOTENSION:  #  #  #  YES  #  #  #  Has patient had a PCN reaction causing severe rash involving mucus membranes or skin necrosis: No Has patient had a PCN reaction that required hospitalization No Has patient had a PCN reaction occurring within the last 10 years: No If all of the above answers are "NO", then may proceed with Cephalosporin use.   . Captopril Cough  . Simvastatin Other (See Comments)    MYALGIAS BUTTOCKS CRAMP  . Ciprofloxacin Other (See Comments)    Sores in mouth   . Adhesive [Tape] Rash  . Latex Rash    Patient states she gets red and a rash when latex touches her.   Family History  Problem Relation Age of Onset  . Heart failure Mother   . Heart disease Mother   . Hypertension Mother   . Other Mother        varicose veins  . Deep vein thrombosis Mother   . Varicose Veins Mother   . Heart attack Mother   . Hypertension Sister  X 2  . Diabetes Sister   . Hyperlipidemia Sister   . Other Sister        varicose veins  . Heart disease Father   . Diabetes Father   . Hyperlipidemia Father   . Hypertension Father   . Heart attack Father   . Heart disease Brother        MI in late 9s  . Hyperlipidemia Brother   . Heart attack Brother   . Coronary artery disease Unknown   . Diabetes Unknown   . Parkinsonism Brother   . Heart failure Brother   . Hypertension Daughter    PE: BP (!) 138/42   Pulse 90   Ht 5' 3.5" (1.613 m)   Wt 158 lb (71.7 kg)    SpO2 94%   BMI 27.55 kg/m  Body mass index is 27.55 kg/m. Wt Readings from Last 3 Encounters:  04/21/17 158 lb (71.7 kg)  04/18/17 158 lb 6.4 oz (71.8 kg)  03/03/17 161 lb (73 kg)   Constitutional: overweight, in NAD Eyes: PERRLA, EOMI, no exophthalmos ENT: moist mucous membranes, no thyromegaly, no cervical lymphadenopathy Cardiovascular: tachycardia, RR, No MRG Respiratory: CTA B Gastrointestinal: abdomen soft, NT, ND, BS+ Musculoskeletal: no deformities, strength intact in all 4 Skin: moist, warm, no rashes Neurological: no tremor with outstretched hands, DTR normal in all 4  ASSESSMENT: 1. DM2, non-insulin-dependent, controlled, with complications - CKD stage 3  PLAN:  1. Patient with long-standing, uncontrolled diabetes, on oral diabetes regimen, prev. On Metformin and Glipizide after she started Prednisone for FSGS. Now off the Prednisone and kidney fxn is stable per recent visit with Dr. Posey Pronto, although she still has proteinuria per her results review. We continued on Glipizide. Sugars are slightly fluctuating but mostly at goal. No lows although sometimes she feels a little week >> eats a protein bar. Does not check sugars then >> advised her to start checking then - I suggested to:  Patient Instructions  Please continue: - Glipizide to 5 mg before b'fast and 10 mg before dinner. - Metformin 500 mg daily 2x a day  Please return in 6 months with your sugar log.   - today, HbA1c is 6.2%  - continue checking sugars at different times of the day - check 1x a day, rotating checks - advised for yearly eye exams >> she is UTD - Return to clinic in 6 mo (per her preference) with sugar log   Philemon Kingdom, MD PhD [facility] Endocrinology

## 2017-04-27 ENCOUNTER — Ambulatory Visit: Payer: PPO | Admitting: Pulmonary Disease

## 2017-05-19 ENCOUNTER — Ambulatory Visit (INDEPENDENT_AMBULATORY_CARE_PROVIDER_SITE_OTHER): Payer: PPO | Admitting: Cardiovascular Disease

## 2017-05-19 ENCOUNTER — Encounter: Payer: Self-pay | Admitting: Cardiovascular Disease

## 2017-05-19 VITALS — BP 116/58 | HR 76 | Ht 64.0 in | Wt 158.4 lb

## 2017-05-19 DIAGNOSIS — I10 Essential (primary) hypertension: Secondary | ICD-10-CM | POA: Diagnosis not present

## 2017-05-19 DIAGNOSIS — G458 Other transient cerebral ischemic attacks and related syndromes: Secondary | ICD-10-CM

## 2017-05-19 DIAGNOSIS — E785 Hyperlipidemia, unspecified: Secondary | ICD-10-CM

## 2017-05-19 DIAGNOSIS — Z720 Tobacco use: Secondary | ICD-10-CM | POA: Diagnosis not present

## 2017-05-19 NOTE — Progress Notes (Signed)
Cardiology Office Note   Date:  05/19/2017   ID:  Sheila Melton, DOB 02-15-1941, MRN [id number]  PCP:  Binnie Rail, MD  Cardiologist: Dr. Stanford Breed  Chief Complaint  Patient presents with  . Follow-up      History of Present Illness: Sheila Melton is a 76 y.o. female whois here today for a follow-up visit regarding asymptomatic  left subclavian artery stenosis. The patient has known history of abdominal aortic aneurysm followed by vascular surgery, hyperlipidemia, kidney disease currently on steroids, tobacco use and diabetes mellitus.  Echocardiogram July 2009 showed normal left ventricular function and mild mitral regurgitation.  She was told about left subclavian artery stenosis about 7 or 8 years ago which was treated medically due to lack of symptoms.  She continues to deny left arm claudication. No chest pain or shortness of breath. She is trying to quit smoking and currently using electronic cigarettes. She had resection of right lung nodule which was benign.  Past Medical History:  Diagnosis Date  . AAA (abdominal aortic aneurysm) (HCC)    BEING WATCHED   VVS. 4.2 cm infrarenal abdominal aortic aneurysm without rupture noted 11/08/2016  . Arthritis   . Cancer ([place])    skin cancer removed from left shoulder  2017  . CAP (community acquired pneumonia) 09/02/13-11/05/13    with SIRS  . Depression    ?? from prednisone    not on pred now  . Diabetes mellitus    type ii    long time ago.  . Family history of anesthesia complication    PATIENTS MOM HAD TROUBLE WAKING UP   . GERD (gastroesophageal reflux disease)    will take occasional tums  . Heart murmur    still has.   Sees Dr. Stanford Breed  . History of kidney stones    has them at the present time  . Hyperlipidemia   . Hypertension    unspecified essential  . Renal insufficiency    "FSGS"  . Subclavian steal syndrome    LEFT SIDE   NO SIDE EFFECTS  . Subclavian steal syndrome --  left    85% blocked  . UTI  (lower urinary tract infection) 08/26/13   Enterococcus and Escherichia coli both sensitive to nitrofurantoin    Past Surgical History:  Procedure Laterality Date  . ABDOMINAL HYSTERECTOMY    . BACK SURGERY    . CATARACT EXTRACTION     OS  . CYST EXCISION Right 05-23-13   Right index finger: cyst  . DILATION AND CURETTAGE OF UTERUS    . EYE SURGERY    . g1 p1    . SPINE SURGERY  Sept. 2013  . VIDEO ASSISTED THORACOSCOPY (VATS)/ LOBECTOMY Right 02/16/2017   Procedure: Right VIDEO ASSISTED THORACOSCOPY (VATS)/ Right Middle LOBECTOMY;  Surgeon: [name], MD;  Location: [facility];  Service: Thoracic;  Laterality: Right;     Current Outpatient Prescriptions  Medication Sig Dispense Refill  . amLODipine (NORVASC) 10 MG tablet Take 1 tablet (10 mg total) by mouth daily. 30 tablet 1  . Ascorbic Acid (VITAMIN C) 250 MG CHEW Chew 500 mg by mouth daily.    Marland Kitchen aspirin EC 81 MG tablet Take 1 tablet (81 mg total) by mouth daily. 90 tablet 3  . B Complex-C (SUPER B COMPLEX PO) Take 1 tablet by mouth daily.     . Blood Glucose Monitoring Suppl (ONE TOUCH ULTRA 2) w/Device KIT Use as advised 1 each 0  .  Cholecalciferol (VITAMIN D3) 2000 UNITS TABS Take 2,000 Units by mouth daily.     . diazepam (VALIUM) 5 MG tablet TAKE 1 TABLET AT BEDTIME AS NEEDED. 90 tablet 0  . fexofenadine (ALLEGRA) 180 MG tablet Take 180 mg by mouth at bedtime.     Marland Kitchen FLUoxetine (PROZAC) 10 MG capsule Take 1 capsule (10 mg total) by mouth daily. (Patient taking differently: Take 10 mg by mouth at bedtime. ) 90 capsule 3  . furosemide (LASIX) 40 MG tablet TAKE 1/2 TO 1 TABLET DAILY AS NEEDED FOR FLUID (Patient taking differently: take 67ms by mouth every morning) 90 tablet 3  . glipiZIDE (GLUCOTROL) 5 MG tablet TAKE 2 TABLETS IN AM AND 1 TAB IN PM (Patient taking differently: Take 530min the morning and 1061mat night) 270 tablet 3  . labetalol (NORMODYNE) 300 MG tablet TAKE 1 TABLET TWICE DAILY. 180 tablet 3  . Liniments  (SALONPAS PAIN RELIEF PATCH EX) Apply 1 patch topically daily as needed (back pain).    . meloxicam (MOBIC) 7.5 MG tablet TAKE 1 TABLET ONCE DAILY. 90 tablet 0  . metFORMIN (GLUCOPHAGE) 500 MG tablet TAKE 1 TABLET TWICE DAILY. 180 tablet 0  . nystatin cream (MYCOSTATIN) Apply 1 application topically 2 (two) times daily. 30 g 3  . ONE TOUCH ULTRA TEST test strip CHECK BLOOD SUGAR ONCE A DAY. 100 each 0  . pravastatin (PRAVACHOL) 40 MG tablet TAKE 1 TABLET DAILY. (Patient taking differently: TAKE 1 TABLET DAILY AT BEDTIME) 90 tablet 3  . traMADol (ULTRAM) 50 MG tablet Take 1 tablet (50 mg total) by mouth daily as needed for severe pain. 30 tablet 0  . Triamcinolone Acetonide (NASACORT ALLERGY 24HR NA) Place 1 spray into the nose 2 (two) times daily as needed (congestion).    . valsartan (DIOVAN) 320 MG tablet TAKE ONE TABLET AT BEDTIME. 90 tablet 3  . vitamin E 400 UNIT capsule Take 400 Units by mouth daily.     No current facility-administered medications for this visit.     Allergies:   Rocephin [ceftriaxone sodium in dextrose]; Amoxicillin; Captopril; Simvastatin; Ciprofloxacin; Adhesive [tape]; and Latex    Social History:  The patient  reports that she has been smoking E-cigarettes.  She has a 50.00 pack-year smoking history. She has never used smokeless tobacco. She reports that she does not drink alcohol or use drugs.   Family History:  The patient's family history includes Deep vein thrombosis in her mother; Diabetes in her father and sister; Heart attack in her brother, father, and mother; Heart disease in her brother, father, and mother; Heart failure in her brother and mother; Hyperlipidemia in her brother, father, and sister; Hypertension in her daughter, father, mother, and sister; Other in her mother and sister; Parkinsonism in her brother; Varicose Veins in her mother.    ROS:  Please see the history of present illness.   Otherwise, review of systems are positive for none.   All  other systems are reviewed and negative.    PHYSICAL EXAM: VS:  BP (!) 116/58   Pulse 76   Ht 5' 4" (1.626 m)   Wt 158 lb 6.4 oz (71.8 kg)   BMI 27.19 kg/m  , BMI Body mass index is 27.19 kg/m. GEN: Well nourished, well developed, in no acute distress  HEENT: normal  Neck: no JVD, carotid bruits, or masses. She does have a bruit at the left upper chest area Cardiac: RRR; rubs, or gallops,no edema . There is 1/  6 systolic ejection murmur in the aortic area Respiratory:  clear to auscultation bilaterally, normal work of breathing GI: soft, nontender, nondistended, + BS MS: no deformity or atrophy  Skin: warm and dry, no rash Neuro:  Strength and sensation are intact Psych: euthymic mood, full affect Vascular: Radial pulses normal on the right side and mildly diminished on the left side.  EKG:  EKG is not ordered today.   Recent Labs: 02/18/2017: ALT 18 02/19/2017: BUN 16; Creatinine, Ser 1.15; Hemoglobin 9.9; Platelets 317; Potassium 3.4; Sodium 139    Lipid Panel    Component Value Date/Time   CHOL 245 (H) 09/26/2015 0817   TRIG 322.0 (H) 09/26/2015 0817   TRIG 263 (HH) 09/15/2006 1116   HDL 40.90 09/26/2015 0817   CHOLHDL 6 09/26/2015 0817   VLDL 64.4 (H) 09/26/2015 0817   LDLCALC 123 (H) 05/24/2014 0746   LDLDIRECT 118.0 09/26/2015 0817      Wt Readings from Last 3 Encounters:  05/19/17 158 lb 6.4 oz (71.8 kg)  04/21/17 158 lb (71.7 kg)  04/18/17 158 lb 6.4 oz (71.8 kg)        ASSESSMENT AND PLAN:  1.  Left subclavian artery stenosis: She continues to be asymptomatic. Continue medical therapy. Avoid checking blood pressure in left arm given that the breathing is not accurate and will always underestimated Central aortic pressure. Continue low-dose aspirin.  2. Essential hypertension: Blood pressure is well controlled.  3. Hyperlipidemia: The patient reports buttocks cramps when she took simvastatin. However, the symptoms might have been related to lower back  issues. She is currently on pravastatin. I recommend switching the patient to atorvastatin or rosuvastatin and trying to achieve an LDL target below 70. I will forward this to primary care physician.  4. Tobacco use: She is using electronic cigarettes and trying to quit smoking. I discussed with her the importance of complete cessation.   Disposition:   FU with me in 1 year  Signed,  Kathlyn Sacramento, MD  05/19/2017 11:46 AM    [place]

## 2017-05-19 NOTE — Patient Instructions (Signed)

## 2017-05-20 ENCOUNTER — Encounter: Payer: Self-pay | Admitting: Internal Medicine

## 2017-05-20 ENCOUNTER — Ambulatory Visit (INDEPENDENT_AMBULATORY_CARE_PROVIDER_SITE_OTHER): Payer: PPO | Admitting: Internal Medicine

## 2017-05-20 ENCOUNTER — Telehealth: Payer: Self-pay | Admitting: Internal Medicine

## 2017-05-20 VITALS — BP 112/54 | HR 80 | Temp 98.1°F | Resp 16 | Wt 158.0 lb

## 2017-05-20 DIAGNOSIS — E782 Mixed hyperlipidemia: Secondary | ICD-10-CM

## 2017-05-20 DIAGNOSIS — E1159 Type 2 diabetes mellitus with other circulatory complications: Secondary | ICD-10-CM | POA: Diagnosis not present

## 2017-05-20 DIAGNOSIS — F411 Generalized anxiety disorder: Secondary | ICD-10-CM

## 2017-05-20 DIAGNOSIS — F3289 Other specified depressive episodes: Secondary | ICD-10-CM

## 2017-05-20 DIAGNOSIS — G479 Sleep disorder, unspecified: Secondary | ICD-10-CM

## 2017-05-20 DIAGNOSIS — I10 Essential (primary) hypertension: Secondary | ICD-10-CM | POA: Diagnosis not present

## 2017-05-20 DIAGNOSIS — N051 Unspecified nephritic syndrome with focal and segmental glomerular lesions: Secondary | ICD-10-CM

## 2017-05-20 DIAGNOSIS — G458 Other transient cerebral ischemic attacks and related syndromes: Secondary | ICD-10-CM

## 2017-05-20 MED ORDER — ROSUVASTATIN CALCIUM 10 MG PO TABS: 10.0000 mg | ORAL_TABLET | Freq: Every day | ORAL | 3 refills | Status: DC

## 2017-05-20 NOTE — Assessment & Plan Note (Signed)
Controlled, stable Continue current dose of medication - paxil 10 mg daily

## 2017-05-20 NOTE — Telephone Encounter (Signed)
Please resend rosuvastatin (CRESTOR) 10 MG tablet  To [facility] in [place]

## 2017-05-20 NOTE — Assessment & Plan Note (Signed)
Follows with vascular - stable

## 2017-05-20 NOTE — Assessment & Plan Note (Addendum)
LDL goal < 70 given subclavian steal syn Will change pravastatin to crestor 10 mg daily Recheck cmp, lipid in about 6 weeks  encouraged increased exercise

## 2017-05-20 NOTE — Patient Instructions (Addendum)
Have blood work done in 6 weeks  Test(s) ordered today. Your results will be released to [place] (or called to you) after review, usually within 72hours after test completion. If any changes need to be made, you will be notified at that same time.  All other Health Maintenance issues reviewed.   All recommended immunizations and age-appropriate screenings are up-to-date or discussed.  No immunizations administered today.   Medications reviewed and updated.  Changes include changing pravastatin to crestor 10 mg daily.   Your prescription(s) have been submitted to your pharmacy. Please take as directed and contact our office if you believe you are having problem(s) with the medication(s).    Please followup in 6 months

## 2017-05-20 NOTE — Assessment & Plan Note (Signed)
BP well controlled Current regimen effective and well tolerated Continue current medications at current doses  

## 2017-05-20 NOTE — Progress Notes (Signed)
Subjective:    Patient ID: Sheila Melton, female    DOB: 1941/09/05, 76 y.o.   MRN: [id number]  HPI The patient is here for follow up.  Hypertension: She is taking her medication daily. She is compliant with a low sodium diet.  She denies chest pain, palpitations, edema, shortness of breath and regular headaches. She is not exercising regularly.      Diabetes: She is following with Dr Cruzita Lederer.  She is taking her medication daily as prescribed. She is compliant with a diabetic diet. She is not exercising regularly. She checks her feet daily and denies foot lesions. She is up-to-date with an ophthalmology examination.   Anxiety, insomnia: She is taking her medication daily as prescribed - paxil and valium at night. She denies any side effects from the medication. She feels her anxiety is well controlled and she is happy with her current dose of medication.     Medications and allergies reviewed with patient and updated if appropriate.  Patient Active Problem List   Diagnosis Date Noted  . Skin yeast infection 02/26/2017  . 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 Osteoporosis 12/25/2016  . Solitary pulmonary nodule 12/19/2016  . Depression 08/15/2016  . 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 CKD (chronic kidney disease) 05/21/2016  . Sleep difficulties 01/18/2016  . Diabetes mellitus ([place]) 10/23/2015  . Numbness of toes-Right foot 06/26/2015  . Colitis 03/19/2015  . Nephrolithiasis 03/19/2015  . AAA (abdominal aortic aneurysm) without rupture ([place]) 03/19/2015  . Anxiety state 03/19/2015  . Skin cancer 01/09/2015  . Former smoker 05/24/2014  . Bilateral renal cysts 09/16/2012  . 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 Degenerative spondylolisthesis 07/26/2012  . SHOULDER PAIN, LEFT, CHRONIC 07/25/2009  . CARDIAC MURMUR, SYSTOLIC 15/52/0802  . Mixed hyperlipidemia 12/30/2007  . Tobacco use disorder 12/30/2007  . Essential hypertension  12/30/2007  . Subclavian steal syndrome 12/30/2007  . Palpitations 12/30/2007  . CAROTID BRUIT 04/07/2007    Current Outpatient Prescriptions on File Prior to Visit  Medication Sig Dispense Refill  . amLODipine (NORVASC) 10 MG tablet Take 1 tablet (10 mg total) by mouth daily. 30 tablet 1  . Ascorbic Acid (VITAMIN C) 250 MG CHEW Chew 500 mg by mouth daily.    Marland Kitchen aspirin EC 81 MG tablet Take 1 tablet (81 mg total) by mouth daily. 90 tablet 3  . B Complex-C (SUPER B COMPLEX PO) Take 1 tablet by mouth daily.     . Blood Glucose Monitoring Suppl (ONE TOUCH ULTRA 2) w/Device KIT Use as advised 1 each 0  . Cholecalciferol (VITAMIN D3) 2000 UNITS TABS Take 2,000 Units by mouth daily.     . diazepam (VALIUM) 5 MG tablet TAKE 1 TABLET AT BEDTIME AS NEEDED. 90 tablet 0  . fexofenadine (ALLEGRA) 180 MG tablet Take 180 mg by mouth at bedtime.     Marland Kitchen FLUoxetine (PROZAC) 10 MG capsule Take 1 capsule (10 mg total) by mouth daily. (Patient taking differently: Take 10 mg by mouth at bedtime. ) 90 capsule 3  . furosemide (LASIX) 40 MG tablet TAKE 1/2 TO 1 TABLET DAILY AS NEEDED FOR FLUID (Patient taking differently: take 65ms by mouth every morning) 90 tablet 3  . glipiZIDE (GLUCOTROL) 5 MG tablet TAKE 2 TABLETS IN AM AND 1 TAB IN PM (Patient taking differently: Take 558min the morning and 1062mat night) 270 tablet 3  . labetalol (NORMODYNE) 300 MG tablet TAKE 1 TABLET TWICE DAILY. 180[place]  tablet 3  . Liniments (SALONPAS PAIN RELIEF PATCH EX) Apply 1 patch topically daily as needed (back pain).    . meloxicam (MOBIC) 7.5 MG tablet TAKE 1 TABLET ONCE DAILY. 90 tablet 0  . metFORMIN (GLUCOPHAGE) 500 MG tablet TAKE 1 TABLET TWICE DAILY. 180 tablet 0  . nystatin cream (MYCOSTATIN) Apply 1 application topically 2 (two) times daily. 30 g 3  . ONE TOUCH ULTRA TEST test strip CHECK BLOOD SUGAR ONCE A DAY. 100 each 0  . pravastatin (PRAVACHOL) 40 MG tablet TAKE 1 TABLET DAILY. (Patient taking differently: TAKE 1 TABLET  DAILY AT BEDTIME) 90 tablet 3  . traMADol (ULTRAM) 50 MG tablet Take 1 tablet (50 mg total) by mouth daily as needed for severe pain. 30 tablet 0  . Triamcinolone Acetonide (NASACORT ALLERGY 24HR NA) Place 1 spray into the nose 2 (two) times daily as needed (congestion).    . valsartan (DIOVAN) 320 MG tablet TAKE ONE TABLET AT BEDTIME. 90 tablet 3  . vitamin E 400 UNIT capsule Take 400 Units by mouth daily.     No current facility-administered medications on file prior to visit.     Past Medical History:  Diagnosis Date  . AAA (abdominal aortic aneurysm) (HCC)    BEING WATCHED   VVS. 4.2 cm infrarenal abdominal aortic aneurysm without rupture noted 11/08/2016  . Arthritis   . Cancer ([place])    skin cancer removed from left shoulder  2017  . CAP (community acquired pneumonia) 09/02/13-11/05/13    with SIRS  . Depression    ?? from prednisone    not on pred now  . Diabetes mellitus    type ii    long time ago.  . Family history of anesthesia complication    PATIENTS MOM HAD TROUBLE WAKING UP   . GERD (gastroesophageal reflux disease)    will take occasional tums  . Heart murmur    still has.   Sees Dr. Stanford Breed  . History of kidney stones    has them at the present time  . Hyperlipidemia   . Hypertension    unspecified essential  . Renal insufficiency    "FSGS"  . Subclavian steal syndrome    LEFT SIDE   NO SIDE EFFECTS  . Subclavian steal syndrome --  left    85% blocked  . UTI (lower urinary tract infection) 08/26/13   Enterococcus and Escherichia coli both sensitive to nitrofurantoin    Past Surgical History:  Procedure Laterality Date  . ABDOMINAL HYSTERECTOMY    . BACK SURGERY    . CATARACT EXTRACTION     OS  . CYST EXCISION Right 05-23-13   Right index finger: cyst  . DILATION AND CURETTAGE OF UTERUS    . EYE SURGERY    . g1 p1    . SPINE SURGERY  Sept. 2013  . VIDEO ASSISTED THORACOSCOPY (VATS)/ LOBECTOMY Right 02/16/2017   Procedure: Right VIDEO ASSISTED  THORACOSCOPY (VATS)/ Right Middle LOBECTOMY;  Surgeon: [name], MD;  Location: [facility];  Service: Thoracic;  Laterality: Right;    Social History   Social History  . Marital status: Married    Spouse name: N/A  . Number of children: 1  . Years of education: N/A   Social History Main Topics  . Smoking status: Current Some Day Smoker    Packs/day: 1.00    Years: 50.00    Types: E-cigarettes  . Smokeless tobacco: Never Used     Comment: e-cigs  .  Alcohol use No  . Drug use: No  . Sexual activity: Not on file   Other Topics Concern  . Not on file   Social History Narrative   Has 3 caffeinated bev a day      Exercise: none    Family History  Problem Relation Age of Onset  . Heart failure Mother   . Heart disease Mother   . Hypertension Mother   . Other Mother        varicose veins  . Deep vein thrombosis Mother   . Varicose Veins Mother   . Heart attack Mother   . Hypertension Sister        X 2  . Diabetes Sister   . Hyperlipidemia Sister   . Other Sister        varicose veins  . Heart disease Father   . Diabetes Father   . Hyperlipidemia Father   . Hypertension Father   . Heart attack Father   . Heart disease Brother        MI in late 57s  . Hyperlipidemia Brother   . Heart attack Brother   . Coronary artery disease Unknown   . Diabetes Unknown   . Parkinsonism Brother   . Heart failure Brother   . Hypertension Daughter     Review of Systems  Constitutional: Negative for chills and fever.  Respiratory: Negative for cough, shortness of breath and wheezing.   Cardiovascular: Negative for chest pain, palpitations and leg swelling.  Neurological: Negative for light-headedness and headaches.       Objective:   Vitals:   05/20/17 1352  BP: (!) 112/54  Pulse: 80  Resp: 16  Temp: 98.1 F (36.7 C)   Wt Readings from Last 3 Encounters:  05/20/17 158 lb (71.7 kg)  05/19/17 158 lb 6.4 oz (71.8 kg)  04/21/17 158 lb (71.7 kg)   Body mass  index is 27.12 kg/m.   Physical Exam    Constitutional: Appears well-developed and well-nourished. No distress.  HENT:  Head: Normocephalic and atraumatic.  Neck: Neck supple. No tracheal deviation present. No thyromegaly present.  No cervical lymphadenopathy Cardiovascular: Normal rate, regular rhythm and normal heart sounds.   No murmur heard. No carotid bruit .  No edema Pulmonary/Chest: Effort normal and breath sounds normal. No respiratory distress. No has no wheezes. No rales.  Skin: Skin is warm and dry. Not diaphoretic.  Psychiatric: Normal mood and affect. Behavior is normal.      Assessment & Plan:    See Problem List for Assessment and Plan of chronic medical problems.

## 2017-05-20 NOTE — Assessment & Plan Note (Signed)
Controlled, stable Continue current dose of medication  

## 2017-05-20 NOTE — Assessment & Plan Note (Signed)
Lab Results  Component Value Date   HGBA1C 6.2 04/21/2017    Well controlled Management per Dr Cruzita Lederer

## 2017-05-20 NOTE — Assessment & Plan Note (Signed)
Taking the valium nightly Sleep well controlled

## 2017-05-20 NOTE — Assessment & Plan Note (Signed)
Stable Following with [facility] - Dr Posey Pronto

## 2017-06-23 ENCOUNTER — Other Ambulatory Visit: Payer: Self-pay | Admitting: Internal Medicine

## 2017-06-23 MED ORDER — LOSARTAN POTASSIUM 100 MG PO TABS: 100.0000 mg | ORAL_TABLET | Freq: Every day | ORAL | 3 refills | Status: DC

## 2017-06-24 NOTE — Progress Notes (Signed)
Pre visit review using our clinic review tool, if applicable. No additional management support is needed unless otherwise documented below in the visit note. 

## 2017-06-24 NOTE — Progress Notes (Addendum)
Subjective:   Sheila Melton is a 76 y.o. female who presents for an Initial Medicare Annual Wellness Visit.  Review of Systems    No ROS.  Medicare Wellness Visit. Additional risk factors are reflected in the social history.   Cardiac Risk Factors include: advanced age (>63mn, >>64women) Sleep patterns: feels rested on waking, gets up 1 times nightly to void and sleeps 8 hours nightly.    Home Safety/Smoke Alarms: Feels safe in home. Smoke alarms in place.  Living environment; residence and Firearm Safety: 1-story house/ trailer, no firearms. Lives with husband, no needs for DME, good support system Seat Belt Safety/Bike Helmet: Wears seat belt.   Counseling:   Eye Exam-  appointment yearly, Dr. DIdolina PrimerDental- appointment every 6 months, Dr. REvelene Croon Female:   Pap- N/A      Mammo- Last 01/18/14,  BI-RADS category 1: negative, decline referral      Dexa scan- Last 12/18/16, osteoporosis     CCS- Last 08/02/10, recall 10 years     Objective:    Today's Vitals   06/25/17 1418  BP: 116/62  Pulse: 81  Resp: 20  SpO2: 99%  Weight: 159 lb (72.1 kg)  Height: _0  (1.626 m)   Body mass index is 27.29 kg/m.   Current Medications (verified) Outpatient Encounter Prescriptions as of 06/25/2017  Medication Sig  . amLODipine (NORVASC) 10 MG tablet Take 1 tablet (10 mg total) by mouth daily.  . Ascorbic Acid (VITAMIN C) 250 MG CHEW Chew 500 mg by mouth daily.  .Marland Kitchenaspirin EC 81 MG tablet Take 1 tablet (81 mg total) by mouth daily.  . B Complex-C (SUPER B COMPLEX PO) Take 1 tablet by mouth daily.   . Blood Glucose Monitoring Suppl (ONE TOUCH ULTRA 2) w/Device KIT Use as advised  . Cholecalciferol (VITAMIN D3) 2000 UNITS TABS Take 2,000 Units by mouth daily.   . diazepam (VALIUM) 5 MG tablet TAKE 1 TABLET AT BEDTIME AS NEEDED.  . fexofenadine (ALLEGRA) 180 MG tablet Take 180 mg by mouth at bedtime.   .Marland KitchenFLUoxetine (PROZAC) 10 MG capsule Take 1 capsule (10 mg total) by mouth daily.  (Patient taking differently: Take 10 mg by mouth at bedtime. )  . furosemide (LASIX) 40 MG tablet TAKE 1/2 TO 1 TABLET DAILY AS NEEDED FOR FLUID (Patient taking differently: take 4255m by mouth every morning)  . glipiZIDE (GLUCOTROL) 5 MG tablet TAKE 2 TABLETS IN AM AND 1 TAB IN PM (Patient taking differently: Take 55m69mn the morning and 65m42mt night)  . labetalol (NORMODYNE) 300 MG tablet TAKE 1 TABLET TWICE DAILY.  . LiMarland Kitcheniments (SALONPAS PAIN RELIEF PATCH EX) Apply 1 patch topically daily as needed (back pain).  . loMarland Kitchenartan (COZAAR) 100 MG tablet Take 1 tablet (100 mg total) by mouth daily.  . meloxicam (MOBIC) 7.5 MG tablet TAKE 1 TABLET ONCE DAILY.  . metFORMIN (GLUCOPHAGE) 500 MG tablet TAKE 1 TABLET TWICE DAILY.  . ONE TOUCH ULTRA TEST test strip CHECK BLOOD SUGAR ONCE A DAY.  . rosuvastatin (CRESTOR) 10 MG tablet Take 1 tablet (10 mg total) by mouth daily.  . traMADol (ULTRAM) 50 MG tablet Take 1 tablet (50 mg total) by mouth daily as needed for severe pain.  . Triamcinolone Acetonide (NASACORT ALLERGY 24HR NA) Place 1 spray into the nose 2 (two) times daily as needed (congestion).  . vitamin E 400 UNIT capsule Take 400 Units by mouth daily.  . [DISCONTINUED] nystatin cream (MYCOSTATIN) Apply  1 application topically 2 (two) times daily. (Patient not taking: Reported on 06/25/2017)   No facility-administered encounter medications on file as of 06/25/2017.     Allergies (verified) Rocephin [ceftriaxone sodium in dextrose]; Amoxicillin; Captopril; Simvastatin; Ciprofloxacin; Adhesive [tape]; and Latex   History: Past Medical History:  Diagnosis Date  . AAA (abdominal aortic aneurysm) (HCC)    BEING WATCHED   VVS. 4.2 cm infrarenal abdominal aortic aneurysm without rupture noted 11/08/2016  . Arthritis   . Cancer ([place])    skin cancer removed from left shoulder  2017  . CAP (community acquired pneumonia) 09/02/13-11/05/13    with SIRS  . Depression    ?? from prednisone    not on pred  now  . Diabetes mellitus    type ii    long time ago.  . Family history of anesthesia complication    PATIENTS MOM HAD TROUBLE WAKING UP   . GERD (gastroesophageal reflux disease)    will take occasional tums  . Heart murmur    still has.   Sees Dr. Stanford Breed  . History of kidney stones    has them at the present time  . Hyperlipidemia   . Hypertension    unspecified essential  . Renal insufficiency    "FSGS"  . Subclavian steal syndrome    LEFT SIDE   NO SIDE EFFECTS  . Subclavian steal syndrome --  left    85% blocked  . UTI (lower urinary tract infection) 08/26/13   Enterococcus and Escherichia coli both sensitive to nitrofurantoin   Past Surgical History:  Procedure Laterality Date  . ABDOMINAL HYSTERECTOMY    . BACK SURGERY    . CATARACT EXTRACTION     OS  . CYST EXCISION Right 05-23-13   Right index finger: cyst  . DILATION AND CURETTAGE OF UTERUS    . EYE SURGERY    . g1 p1    . SPINE SURGERY  Sept. 2013  . VIDEO ASSISTED THORACOSCOPY (VATS)/ LOBECTOMY Right 02/16/2017   Procedure: Right VIDEO ASSISTED THORACOSCOPY (VATS)/ Right Middle LOBECTOMY;  Surgeon: [name], MD;  Location: [facility];  Service: Thoracic;  Laterality: Right;   Family History  Problem Relation Age of Onset  . Heart failure Mother   . Heart disease Mother   . Hypertension Mother   . Other Mother        varicose veins  . Deep vein thrombosis Mother   . Varicose Veins Mother   . Heart attack Mother   . Hypertension Sister        X 2  . Diabetes Sister   . Hyperlipidemia Sister   . Other Sister        varicose veins  . Heart disease Father   . Diabetes Father   . Hyperlipidemia Father   . Hypertension Father   . Heart attack Father   . Heart disease Brother        MI in late 10s  . Hyperlipidemia Brother   . Heart attack Brother   . Coronary artery disease Unknown   . Diabetes Unknown   . Parkinsonism Brother   . Heart failure Brother   . Hypertension Daughter    Social  History   Occupational History  . Not on file.   Social History Main Topics  . Smoking status: Current Some Day Smoker    Packs/day: 1.00    Years: 50.00    Types: E-cigarettes  . Smokeless tobacco: Never Used  Comment: e-cigs  . Alcohol use No  . Drug use: No  . Sexual activity: Not on file    Tobacco Counseling Ready to quit: Not Answered Counseling given: Not Answered   Activities of Daily Living In your present state of health, do you have any difficulty performing the following activities: 06/25/2017 02/16/2017  Hearing? N N  Vision? N N  Difficulty concentrating or making decisions? N N  Walking or climbing stairs? N N  Dressing or bathing? N N  Doing errands, shopping? N N  Preparing Food and eating ? N -  Using the Toilet? N -  In the past six months, have you accidently leaked urine? N -  Do you have problems with loss of bowel control? N -  Managing your Medications? N -  Managing your Finances? N -  Housekeeping or managing your Housekeeping? N -  Some recent data might be hidden    Immunizations and Health Maintenance Immunization History  Administered Date(s) Administered  . Influenza Split 08/28/2011, 09/07/2012  . Influenza Whole 09/06/2008, 08/30/2009, 10/11/2010  . Influenza, High Dose Seasonal PF 08/24/2013, 08/18/2014, 08/07/2015, 08/08/2016  . Pneumococcal Conjugate-13 09/26/2015  . Pneumococcal Polysaccharide-23 12/10/2009   Health Maintenance Due  Topic Date Due  . TETANUS/TDAP  12/29/1961  . MAMMOGRAM  01/19/2016  . FOOT EXAM  04/24/2016  . OPHTHALMOLOGY EXAM  04/25/2017  . INFLUENZA VACCINE  06/10/2017    Patient Care Team: Binnie Rail, MD as PCP - General (Internal Medicine) Suella Broad, MD (Physical Medicine and Rehabilitation) Elmarie Shiley, MD as Consulting Physician (Nephrology)  Indicate any recent Medical Services you may have received from other than [facility] providers in the past year (date may be approximate).       Assessment:   This is a routine wellness examination for Arleen.Physical assessment deferred to PCP.  Hearing/Vision screen Hearing Screening Comments: Able to hear conversational tones w/o difficulty. No issues reported.  Passed whisper test  Dietary issues and exercise activities discussed: Current Exercise Habits: The patient does not participate in regular exercise at present (chair exercise pamphlets provided, discussed water aerobids), Exercise limited by: orthopedic condition(s)  Diet (meal preparation, eat out, water intake, caffeinated beverages, dairy products, fruits and vegetables): in general, a "healthy" diet  , well balanced,eats a variety of fruits and vegetables daily, limits salt, fat/cholesterol, sugar, caffeine, drinks 6-8 glasses of water daily.  Discussed weight loss tips. Relevant patient education assigned to patient using [facility].   Goals    . lose weight to be under 150          Increase my physical activity by perhaps start water aerobics at the [facility] and some chair exercises.      Depression Screen PHQ 2/9 Scores 06/25/2017 11/19/2016 11/22/2013  PHQ - 2 Score 1 0 0  PHQ- 9 Score 2 - -    Fall Risk Fall Risk  06/25/2017 11/19/2016 11/22/2013  Falls in the past year? No No No    Cognitive Function:       Ad8 score reviewed for issues:  Issues making decisions: no  Less interest in hobbies / activities: no  Repeats questions, stories (family complaining): no  Trouble using ordinary gadgets (microwave, computer, phone):no  Forgets the month or year: no  Mismanaging finances: no  Remembering appts: no  Daily problems with thinking and/or memory: no Ad8 score is= 0   Screening Tests Health Maintenance  Topic Date Due  . TETANUS/TDAP  12/29/1961  . MAMMOGRAM  01/19/2016  .  FOOT EXAM  04/24/2016  . OPHTHALMOLOGY EXAM  04/25/2017  . INFLUENZA VACCINE  06/10/2017  . HEMOGLOBIN A1C  10/21/2017  . DEXA SCAN  12/18/2018  . COLONOSCOPY   08/02/2020  . PNA vac Low Risk Adult  Completed      Plan:     Continue doing brain stimulating activities (puzzles, reading, adult coloring books, staying active) to keep memory sharp.   Continue to eat heart healthy diet (full of fruits, vegetables, whole grains, lean protein, water--limit salt, fat, and sugar intake) and increase physical activity as tolerated.  I have personally reviewed and noted the following in the patient's chart:   . Medical and social history . Use of alcohol, tobacco or illicit drugs  . Current medications and supplements . Functional ability and status . Nutritional status . Physical activity . Advanced directives . List of other physicians . Vitals . Screenings to include cognitive, depression, and falls . Referrals and appointments  In addition, I have reviewed and discussed with patient certain preventive protocols, quality metrics, and best practice recommendations. A written personalized care plan for preventive services as well as general preventive health recommendations were provided to patient.     Michiel Cowboy, RN   06/25/2017    Medical screening examination/treatment/procedure(s) were performed by non-physician practitioner and as supervising physician I was immediately available for consultation/collaboration. I agree with above. Binnie Rail, MD

## 2017-06-25 ENCOUNTER — Ambulatory Visit (INDEPENDENT_AMBULATORY_CARE_PROVIDER_SITE_OTHER): Payer: PPO | Admitting: *Deleted

## 2017-06-25 VITALS — BP 116/62 | HR 81 | Resp 20 | Ht 64.0 in | Wt 159.0 lb

## 2017-06-25 DIAGNOSIS — Z0000 Encounter for general adult medical examination without abnormal findings: Secondary | ICD-10-CM

## 2017-06-25 NOTE — Patient Instructions (Addendum)
Continue doing brain stimulating activities (puzzles, reading, adult coloring books, staying active) to keep memory sharp.   Continue to eat heart healthy diet (full of fruits, vegetables, whole grains, lean protein, water--limit salt, fat, and sugar intake) and increase physical activity as tolerated.   Sheila Melton , Thank you for taking time to come for your Medicare Wellness Visit. I appreciate your ongoing commitment to your health goals. Please review the following plan we discussed and let me know if I can assist you in the future.   These are the goals we discussed: Goals    . lose weight to be under 150          Increase my physical activity by perhaps start water aerobics at the [facility] and some chair exercises.       This is a list of the screening recommended for you and due dates:  Health Maintenance  Topic Date Due  . Tetanus Vaccine  12[birth year]29/1961  . Mammogram  01/19/2016  . Complete foot exam   04/24/2016  . Eye exam for diabetics  04/25/2017  . Flu Shot  06/10/2017  . Hemoglobin A1C  10/21/2017  . DEXA scan (bone density measurement)  12/18/2018  . Colon Cancer Screening  08/02/2020  . Pneumonia vaccines  Completed

## 2017-07-10 ENCOUNTER — Other Ambulatory Visit: Payer: Self-pay | Admitting: Internal Medicine

## 2017-07-10 ENCOUNTER — Other Ambulatory Visit (INDEPENDENT_AMBULATORY_CARE_PROVIDER_SITE_OTHER): Payer: PPO

## 2017-07-10 DIAGNOSIS — I10 Essential (primary) hypertension: Secondary | ICD-10-CM | POA: Diagnosis not present

## 2017-07-10 DIAGNOSIS — E1159 Type 2 diabetes mellitus with other circulatory complications: Secondary | ICD-10-CM

## 2017-07-10 DIAGNOSIS — Z0000 Encounter for general adult medical examination without abnormal findings: Secondary | ICD-10-CM | POA: Diagnosis not present

## 2017-07-10 DIAGNOSIS — E782 Mixed hyperlipidemia: Secondary | ICD-10-CM

## 2017-07-10 LAB — COMPREHENSIVE METABOLIC PANEL
ALBUMIN: 4.1 g/dL (ref 3.5–5.2)
ALK PHOS: 73 U/L (ref 39–117)
ALT: 13 U/L (ref 0–35)
AST: 16 U/L (ref 0–37)
BILIRUBIN TOTAL: 0.4 mg/dL (ref 0.2–1.2)
BUN: 17 mg/dL (ref 6–23)
CALCIUM: 9.6 mg/dL (ref 8.4–10.5)
CO2: 25 mEq/L (ref 19–32)
CREATININE: 1.17 mg/dL (ref 0.40–1.20)
Chloride: 103 mEq/L (ref 96–112)
GFR: 47.99 mL/min — ABNORMAL LOW (ref >60.00)
Glucose, Bld: 140 mg/dL — ABNORMAL HIGH (ref 70–99)
Potassium: 3.3 mEq/L — ABNORMAL LOW (ref 3.5–5.1)
Sodium: 139 mEq/L (ref 135–145)
TOTAL PROTEIN: 6.9 g/dL (ref 6.0–8.3)

## 2017-07-10 LAB — LIPID PANEL
CHOLESTEROL: 187 mg/dL (ref 0–200)
HDL: 38.3 mg/dL — ABNORMAL LOW (ref >39.00)
NonHDL: 148.45
Total CHOL/HDL Ratio: 5
Triglycerides: 301 mg/dL — ABNORMAL HIGH (ref 0.0–149.0)
VLDL: 60.2 mg/dL — AB (ref 0.0–40.0)

## 2017-07-10 LAB — TSH: TSH: 3.84 u[IU]/mL (ref 0.35–4.50)

## 2017-07-10 LAB — LDL CHOLESTEROL, DIRECT: Direct LDL: 77 mg/dL

## 2017-07-14 ENCOUNTER — Other Ambulatory Visit (HOSPITAL_COMMUNITY): Payer: Medicare Other

## 2017-07-14 ENCOUNTER — Ambulatory Visit: Payer: Medicare Other | Admitting: Family

## 2017-07-14 NOTE — Telephone Encounter (Signed)
[place] Controlled Substance Database checked for diazepam. Last filled on 04/22/17 for #90. Meloxicam filled on 04/22/17. prozac not due until oct. Please advise.

## 2017-07-14 NOTE — Telephone Encounter (Signed)
Valium due.  Other scripts will be held until due -- approved.

## 2017-07-14 NOTE — Telephone Encounter (Signed)
RX faxed to [facility] 

## 2017-07-15 ENCOUNTER — Other Ambulatory Visit: Payer: Self-pay | Admitting: Emergency Medicine

## 2017-07-15 MED ORDER — ROSUVASTATIN CALCIUM 20 MG PO TABS: 20.0000 mg | ORAL_TABLET | Freq: Every day | ORAL | 1 refills | Status: DC

## 2017-07-20 ENCOUNTER — Other Ambulatory Visit: Payer: Self-pay | Admitting: Internal Medicine

## 2017-07-20 NOTE — Telephone Encounter (Signed)
[place] Controlled Substance Database checked. Last filled on 04/04/17

## 2017-07-21 DIAGNOSIS — E119 Type 2 diabetes mellitus without complications: Secondary | ICD-10-CM | POA: Diagnosis not present

## 2017-07-21 DIAGNOSIS — Z83511 Family history of glaucoma: Secondary | ICD-10-CM | POA: Diagnosis not present

## 2017-07-21 DIAGNOSIS — H40013 Open angle with borderline findings, low risk, bilateral: Secondary | ICD-10-CM | POA: Diagnosis not present

## 2017-07-21 DIAGNOSIS — H40053 Ocular hypertension, bilateral: Secondary | ICD-10-CM | POA: Diagnosis not present

## 2017-07-21 NOTE — Telephone Encounter (Signed)
RX faxed to [facility] 

## 2017-07-28 DIAGNOSIS — H40053 Ocular hypertension, bilateral: Secondary | ICD-10-CM | POA: Diagnosis not present

## 2017-07-28 DIAGNOSIS — H40013 Open angle with borderline findings, low risk, bilateral: Secondary | ICD-10-CM | POA: Diagnosis not present

## 2017-07-28 DIAGNOSIS — Z83511 Family history of glaucoma: Secondary | ICD-10-CM | POA: Diagnosis not present

## 2017-07-28 LAB — HM DIABETES EYE EXAM

## 2017-07-31 ENCOUNTER — Ambulatory Visit (INDEPENDENT_AMBULATORY_CARE_PROVIDER_SITE_OTHER): Payer: PPO

## 2017-07-31 DIAGNOSIS — Z23 Encounter for immunization: Secondary | ICD-10-CM

## 2017-08-06 DIAGNOSIS — M79672 Pain in left foot: Secondary | ICD-10-CM | POA: Diagnosis not present

## 2017-08-18 ENCOUNTER — Encounter: Payer: Self-pay | Admitting: Family

## 2017-08-18 ENCOUNTER — Ambulatory Visit (INDEPENDENT_AMBULATORY_CARE_PROVIDER_SITE_OTHER): Payer: PPO | Admitting: Family

## 2017-08-18 ENCOUNTER — Ambulatory Visit (HOSPITAL_COMMUNITY)
Admission: RE | Admit: 2017-08-18 | Discharge: 2017-08-18 | Disposition: A | Discharge status: Home / Self Care | Payer: PPO | Source: Ambulatory Visit | Attending: Family | Admitting: Family

## 2017-08-18 VITALS — BP 138/82 | HR 80 | Temp 98.5°F | Resp 16 | Ht 64.0 in | Wt 156.0 lb

## 2017-08-18 DIAGNOSIS — Z789 Other specified health status: Secondary | ICD-10-CM | POA: Diagnosis not present

## 2017-08-18 DIAGNOSIS — I714 Abdominal aortic aneurysm, without rupture, unspecified: Secondary | ICD-10-CM

## 2017-08-18 DIAGNOSIS — Z87891 Personal history of nicotine dependence: Secondary | ICD-10-CM | POA: Diagnosis not present

## 2017-08-18 DIAGNOSIS — I708 Atherosclerosis of other arteries: Secondary | ICD-10-CM | POA: Diagnosis not present

## 2017-08-18 DIAGNOSIS — Z720 Tobacco use: Secondary | ICD-10-CM

## 2017-08-18 NOTE — Patient Instructions (Addendum)
Before your next abdominal ultrasound:  Take two Extra-Strength Gas-X capsules at bedtime the night before the test. Take another two Extra-Strength Gas-X capsules 3 hours before the test.  Avoid gas forming foods the day before the test.       Abdominal Aortic Aneurysm Blood pumps away from the heart through tubes (blood vessels) called arteries. Aneurysms are weak or damaged places in the wall of an artery. It bulges out like a balloon. An abdominal aortic aneurysm happens in the main artery of the body (aorta). It can burst or tear, causing bleeding inside the body. This is an emergency. It needs treatment right away. What are the causes? The exact cause is unknown. Things that could cause this problem include:  Fat and other substances building up in the lining of a tube.  Swelling of the walls of a blood vessel.  Certain tissue diseases.  Belly (abdominal) trauma.  An infection in the main artery of the body.  What increases the risk? There are things that make it more likely for you to have an aneurysm. These include:  Being over the age of 74 years old.  Having high blood pressure (hypertension).  Being a female.  Being white.  Being very overweight (obese).  Having a family history of aneurysm.  Using tobacco products.  What are the signs or symptoms? Symptoms depend on the size of the aneurysm and how fast it grows. There may not be symptoms. If symptoms occur, they can include:  Pain (belly, side, lower back, or groin).  Feeling full after eating a small amount of food.  Feeling sick to your stomach (nauseous), throwing up (vomiting), or both.  Feeling a lump in your belly that feels like it is beating (pulsating).  Feeling like you will pass out (faint).  How is this treated?  Medicine to control blood pressure and pain.  Imaging tests to see if the aneurysm gets bigger.  Surgery. How is this prevented? To lessen your chance of getting this  condition:  Stop smoking. Stop chewing tobacco.  Limit or avoid alcohol.  Keep your blood pressure, blood sugar, and cholesterol within normal limits.  Eat less salt.  Eat foods low in saturated fats and cholesterol. These are found in animal and whole dairy products.  Eat more fiber. Fiber is found in whole grains, vegetables, and fruits.  Keep a healthy weight.  Stay active and exercise often.  This information is not intended to replace advice given to you by your health care provider. Make sure you discuss any questions you have with your health care provider. Document Released: 02/21/2013 Document Revised: 04/03/2016 Document Reviewed: 11/26/2012 Elsevier Interactive Patient Education  2017 Elsevier Inc.  

## 2017-08-18 NOTE — Progress Notes (Signed)
[facility] [place] [facility]   CC: Follow up Abdominal Aortic Aneurysm  History of Present Illness  Sheila Melton is a 76 y.o. (05-30-1941) female patient of Dr. Donnetta Hutching who returns for followup of her small asymptomatic infrarenal abdominal aortic aneurysm.  Previous studies demonstrate an AAA, measuring 3.67 cm.   She has had lumbar disc surgery by Dr. Trenton Gammon. She has chronic low back pain, denies radiculopathy pain.  She had a bout of colitis about May 2016, she denies abdominal pain since then.  Pt states her kidneys were badly affected by IV contrast for an MRI. The patient is former a smokerbut is using vapor cigarettes with lowest dose nicotine. She reports occasional pain in her low back after walking about 10 minutes, she does not walk much due to this pain, meloxicam helps.  She denies any history of stroke or TIA symptoms. Pt denies family history of AAA.  She had a kidney bx about February 2017, diagnosed with focal segmental glomerulosclerosis, Dr. Posey Pronto is her nephrologist. She takes prednisone for this.   Pt Diabetic: Yes, 6.2 A1C in June 2018, improved from 7.2 (review of records)  Pt smoker: former smoker, quit cigarettes in 2014, using vapor cigarettes with nicotine, states low nicotine   Past Medical History:  Diagnosis Date  . AAA (abdominal aortic aneurysm) (HCC)    BEING WATCHED   VVS. 4.2 cm infrarenal abdominal aortic aneurysm without rupture noted 11/08/2016  . Arthritis   . Cancer ([place])    skin cancer removed from left shoulder  2017  . CAP (community acquired pneumonia) 09/02/13-11/05/13    with SIRS  . Depression    ?? from prednisone    not on pred now  . Diabetes mellitus    type ii    long time ago.  . Family history of anesthesia complication    PATIENTS MOM HAD TROUBLE WAKING UP   . GERD (gastroesophageal reflux disease)    will take occasional tums  . Heart murmur    still has.   Sees Dr. Stanford Breed  . History of kidney  stones    has them at the present time  . Hyperlipidemia   . Hypertension    unspecified essential  . Renal insufficiency    "FSGS"  . Subclavian steal syndrome    LEFT SIDE   NO SIDE EFFECTS  . Subclavian steal syndrome --  left    85% blocked  . UTI (lower urinary tract infection) 08/26/13   Enterococcus and Escherichia coli both sensitive to nitrofurantoin   Past Surgical History:  Procedure Laterality Date  . ABDOMINAL HYSTERECTOMY    . BACK SURGERY    . CATARACT EXTRACTION     OS  . CYST EXCISION Right 05-23-13   Right index finger: cyst  . DILATION AND CURETTAGE OF UTERUS    . EYE SURGERY    . g1 p1    . SPINE SURGERY  Sept. 2013  . VIDEO ASSISTED THORACOSCOPY (VATS)/ LOBECTOMY Right 02/16/2017   Procedure: Right VIDEO ASSISTED THORACOSCOPY (VATS)/ Right Middle LOBECTOMY;  Surgeon: [name], MD;  Location: [facility];  Service: Thoracic;  Laterality: Right;   Social History Social History   Social History  . Marital status: Married    Spouse name: N/A  . Number of children: 1  . Years of education: N/A   Occupational History  . Not on file.   Social History Main Topics  . Smoking status: Current Some Day Smoker  Packs/day: 1.00    Years: 50.00    Types: E-cigarettes  . Smokeless tobacco: Never Used     Comment: e-cigs  . Alcohol use No  . Drug use: No  . Sexual activity: Not on file   Other Topics Concern  . Not on file   Social History Narrative   Has 3 caffeinated bev a day      Exercise: none   Family History Family History  Problem Relation Age of Onset  . Heart failure Mother   . Heart disease Mother   . Hypertension Mother   . Other Mother        varicose veins  . Deep vein thrombosis Mother   . Varicose Veins Mother   . Heart attack Mother   . Hypertension Sister        X 2  . Diabetes Sister   . Hyperlipidemia Sister   . Other Sister        varicose veins  . Heart disease Father   . Diabetes Father   . Hyperlipidemia  Father   . Hypertension Father   . Heart attack Father   . Heart disease Brother        MI in late 33s  . Hyperlipidemia Brother   . Heart attack Brother   . Coronary artery disease Unknown   . Diabetes Unknown   . Parkinsonism Brother   . Heart failure Brother   . Hypertension Daughter     Current Outpatient Prescriptions on File Prior to Visit  Medication Sig Dispense Refill  . amLODipine (NORVASC) 10 MG tablet Take 1 tablet (10 mg total) by mouth daily. 30 tablet 1  . Ascorbic Acid (VITAMIN C) 250 MG CHEW Chew 500 mg by mouth daily.    Marland Kitchen aspirin EC 81 MG tablet Take 1 tablet (81 mg total) by mouth daily. 90 tablet 3  . B Complex-C (SUPER B COMPLEX PO) Take 1 tablet by mouth daily.     . Blood Glucose Monitoring Suppl (ONE TOUCH ULTRA 2) w/Device KIT Use as advised 1 each 0  . Cholecalciferol (VITAMIN D3) 2000 UNITS TABS Take 2,000 Units by mouth daily.     . diazepam (VALIUM) 5 MG tablet TAKE 1 TABLET AT BEDTIME AS NEEDED. 90 tablet 0  . fexofenadine (ALLEGRA) 180 MG tablet Take 180 mg by mouth at bedtime.     Marland Kitchen FLUoxetine (PROZAC) 10 MG capsule TAKE 1 CAPSULE DAILY. 90 capsule 0  . furosemide (LASIX) 40 MG tablet TAKE 1/2 TO 1 TABLET DAILY AS NEEDED FOR FLUID (Patient taking differently: take 2ms by mouth every morning) 90 tablet 3  . glipiZIDE (GLUCOTROL) 5 MG tablet TAKE 2 TABLETS IN THE MORNING AND 1 TABLET IN THE EVENING. 270 tablet 1  . labetalol (NORMODYNE) 300 MG tablet TAKE 1 TABLET TWICE DAILY. 180 tablet 3  . Liniments (SALONPAS PAIN RELIEF PATCH EX) Apply 1 patch topically daily as needed (back pain).    .Marland Kitchenlosartan (COZAAR) 100 MG tablet Take 1 tablet (100 mg total) by mouth daily. 90 tablet 3  . meloxicam (MOBIC) 7.5 MG tablet TAKE 1 TABLET ONCE DAILY. 90 tablet 0  . metFORMIN (GLUCOPHAGE) 500 MG tablet TAKE 1 TABLET TWICE DAILY. 180 tablet 1  . ONE TOUCH ULTRA TEST test strip CHECK BLOOD SUGAR ONCE A DAY. 100 each 0  . rosuvastatin (CRESTOR) 20 MG tablet Take 1  tablet (20 mg total) by mouth daily. 90 tablet 1  . traMADol (ULTRAM) 50 MG tablet  TAKE 1 TABLET DAILY AS NEEDED FOR SEVERE PAIN. 30 tablet 0  . Triamcinolone Acetonide (NASACORT ALLERGY 24HR NA) Place 1 spray into the nose 2 (two) times daily as needed (congestion).    . vitamin E 400 UNIT capsule Take 400 Units by mouth daily.     No current facility-administered medications on file prior to visit.    Allergies  Allergen Reactions  . Rocephin [Ceftriaxone Sodium In Dextrose] Dermatitis and Rash    10/24 /14 blisters of palms & diffuse rash Because of a history of documented adverse serious drug reaction;Medi Alert bracelet  is recommended  . Amoxicillin Rash    Has patient had a PCN reaction causing IMMEDIATE RASH, FACIAL/TONGUE/THROAT SWELLING, SOB, OR LIGHTHEADEDNESS WITH HYPOTENSION:  #  #  #  YES  #  #  #  Has patient had a PCN reaction causing severe rash involving mucus membranes or skin necrosis: No Has patient had a PCN reaction that required hospitalization No Has patient had a PCN reaction occurring within the last 10 years: No If all of the above answers are "NO", then may proceed with Cephalosporin use.   . Captopril Cough  . Simvastatin Other (See Comments)    MYALGIAS BUTTOCKS CRAMP  . Ciprofloxacin Other (See Comments)    Sores in mouth   . Adhesive [Tape] Rash  . Latex Rash    Patient states she gets red and a rash when latex touches her.    ROS: See HPI for pertinent positives and negatives.  Physical Examination  Vitals:   08/18/17 0931  BP: 138/82  Pulse: 80  Resp: 16  Temp: 98.5 F (36.9 C)  TempSrc: Oral  SpO2: 97%  Weight: 156 lb (70.8 kg)  Height: _0  (1.626 m)   Body mass index is 26.78 kg/m.  General: A&O x 3, WD.  Pulmonary: Sym exp, respirations are non labored, good air movt, CTAB, no rales, rhonchi, or wheezing.  Cardiac: RRR, Nl S1, S2, no detected murmur.   Carotid Bruits Right Left   negative positive   Abdominal  aortic pulse is not palpable Radial pulses are 2+ right, 2+ left   VASCULAR EXAM:     LE Pulses Right Left   FEMORAL not palpable(obese) not palpable    POPLITEAL not palpable  not palpable   POSTERIOR TIBIAL 1+ palpable  faintly palpable    DORSALIS PEDIS  ANTERIOR TIBIAL 1+ palpable  not palpable     Gastrointestinal: soft, NTND, -G/R, - HSM, - palpable masses, - CVAT B.  Musculoskeletal: M/S 5/5 in UE's, 4/5 in LE's, Extremities without ischemic changes, all toes are pink and warm with brisk capillary refill.   Neurologic: CN 2-12 intact, Motor exam as listed above.     DATA  AAA Duplex (08/18/2017):  Previous size: 4.1 cm (Date: 07/01/16) The common iliac arteries were not well visualized due to excessive bowel gas.              Current size:  4.5 cm (Date: 08/18/17); Right CIA: 0.9 cm; Left CIA: 0.6 cm  CT abd/pelvis on 11-08-16 to evaluate hematuria, kidney stones: Aorta: 4.2 cm infrarenal abdominal aortic aneurysm without rupture. Soft plaque noted along the anterior and lateral walls. Aneurysm terminates at the bifurcation.  Celiac: Small vascular web versus mild stenosis just distal to the origin of the celiac, series 4, image 44. The origin is not visualized and stenosis is not entirely excluded.  SMA: Mild atherosclerosis at the origin of the SMA. SMA  is patent without significant stenosis.  Renals: Single renal arteries to both kidneys without significant stenosis. No evidence of aneurysm or fibromuscular dysplasia.  IMA: Patent without evidence of aneurysm, dissection, vasculitis or significant stenosis.  Inflow: There is atherosclerosis of both common iliac arteries without aneurysm. Both common iliac arteries measuring 3 mm in caliber.  Proximal  Outflow: Right common femoral artery 7 mm in diameter and the left 5 mm with calcified plaque noted.  Veins: No obvious venous abnormality within the limitations of this arterial phase study.   03/07/16 Carotid duplex:  1-39% bilateral ICA stenosis, essentially stable. Normal right vertebral, subclavian and brachial artery flow. 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   Medical Decision Making  The patient is a 77 y.o. female who presents with asymptomatic AAA with slight increase in size in 14 months, to 4.5 cm today.  She was evaluated by Dr. 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 She denies any steal sx's in either hand, both radial pulses are 2+ palpable.    Based on this patient's exam and diagnostic studies, the patient will follow up in 6 months with the following studies: AAA duplex.  Consideration for repair of AAA would be made when the size is 5.5 cm, growth > 1 cm/yr, and symptomatic status.        The patient was given information about AAA including signs, symptoms, treatment, and how to minimize the risk of enlargement and rupture of aneurysms.    I emphasized the importance of maximal medical management including strict control of blood pressure, blood glucose, and lipid levels, antiplatelet agents, obtaining regular exercise, and cessation of smoking.   The patient was advised to call 911 should the patient experience sudden onset abdominal or back pain.   Thank you for allowing [place] to participate in this patient's care.  Clemon Chambers, RN, MSN, FNP-C [facility] Office: [phone]  Clinic Physician: Early  08/18/2017, 9:59 AM

## 2017-08-25 NOTE — Addendum Note (Signed)
Addended by: Lianne Cure A on: 08/25/2017 04:32 PM   Modules accepted: Orders

## 2017-08-31 ENCOUNTER — Other Ambulatory Visit: Payer: Self-pay | Admitting: *Deleted

## 2017-08-31 DIAGNOSIS — I6529 Occlusion and stenosis of unspecified carotid artery: Secondary | ICD-10-CM

## 2017-09-01 ENCOUNTER — Encounter: Payer: PPO | Admitting: Thoracic Surgery (Cardiothoracic Vascular Surgery)

## 2017-09-14 ENCOUNTER — Other Ambulatory Visit: Payer: Self-pay | Admitting: Thoracic Surgery (Cardiothoracic Vascular Surgery)

## 2017-09-14 DIAGNOSIS — Z902 Acquired absence of lung [part of]: Secondary | ICD-10-CM

## 2017-09-15 ENCOUNTER — Ambulatory Visit: Payer: PPO | Admitting: Thoracic Surgery (Cardiothoracic Vascular Surgery)

## 2017-09-15 ENCOUNTER — Encounter: Payer: Self-pay | Admitting: Thoracic Surgery (Cardiothoracic Vascular Surgery)

## 2017-09-15 ENCOUNTER — Ambulatory Visit
Admission: RE | Admit: 2017-09-15 | Discharge: 2017-09-15 | Disposition: A | Discharge status: Home / Self Care | Payer: PPO | Source: Ambulatory Visit | Attending: Thoracic Surgery (Cardiothoracic Vascular Surgery) | Admitting: Thoracic Surgery (Cardiothoracic Vascular Surgery)

## 2017-09-15 VITALS — BP 136/74 | HR 84 | Ht 64.0 in | Wt 156.0 lb

## 2017-09-15 DIAGNOSIS — Q859 Phakomatosis, unspecified: Secondary | ICD-10-CM

## 2017-09-15 DIAGNOSIS — Z902 Acquired absence of lung [part of]: Secondary | ICD-10-CM

## 2017-09-15 DIAGNOSIS — R918 Other nonspecific abnormal finding of lung field: Secondary | ICD-10-CM | POA: Diagnosis not present

## 2017-09-15 NOTE — Progress Notes (Signed)
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  She did well postoperatively and went home on day 3.  I saw her back in the office in April.  She was doing well at that time but did still have some pain and had developed a yeast infection.  She says she is feeling well.  She does not have any shortness of breath.  She has discomfort when she wears her bra for long periods of time and it rubs on her incision.  Past Medical History:  Diagnosis Date  . AAA (abdominal aortic aneurysm) (HCC)    BEING WATCHED   VVS. 4.2 cm infrarenal abdominal aortic aneurysm without rupture noted 11/08/2016  . Arthritis   . Cancer ([place])    skin cancer removed from left shoulder  2017  . CAP (community acquired pneumonia) 09/02/13-11/05/13    with SIRS  . Depression    ?? from prednisone    not on pred now  . Diabetes mellitus    type ii    long time ago.  . Family history of anesthesia complication    PATIENTS MOM HAD TROUBLE WAKING UP   . GERD (gastroesophageal reflux disease)    will take occasional tums  . Heart murmur    still has.   Sees Dr. Stanford Breed  . History of kidney stones    has them at the present time  . Hyperlipidemia   . Hypertension    unspecified essential  . Renal insufficiency    "FSGS"  . Subclavian steal syndrome    LEFT SIDE   NO SIDE EFFECTS  . Subclavian steal syndrome --  left    85% blocked  . UTI (lower urinary tract infection) 08/26/13   Enterococcus and Escherichia coli both sensitive to nitrofurantoin     Current Outpatient Medications  Medication Sig Dispense Refill  . amLODipine (NORVASC) 10 MG tablet Take 1 tablet (10 mg total) by mouth daily. 30 tablet 1  . Ascorbic Acid (VITAMIN C) 250 MG CHEW Chew 500 mg by mouth daily.    Marland Kitchen aspirin EC  81 MG tablet Take 1 tablet (81 mg total) by mouth daily. 90 tablet 3  . B Complex-C (SUPER B COMPLEX PO) Take 1 tablet by mouth daily.     . Blood Glucose Monitoring Suppl (ONE TOUCH ULTRA 2) w/Device KIT Use as advised 1 each 0  . Cholecalciferol (VITAMIN D3) 2000 UNITS TABS Take 2,000 Units by mouth daily.     . diazepam (VALIUM) 5 MG tablet TAKE 1 TABLET AT BEDTIME AS NEEDED. 90 tablet 0  . fexofenadine (ALLEGRA) 180 MG tablet Take 180 mg by mouth at bedtime.     Marland Kitchen FLUoxetine (PROZAC) 10 MG capsule TAKE 1 CAPSULE DAILY. 90 capsule 0  . furosemide (LASIX) 40 MG tablet TAKE 1/2 TO 1 TABLET DAILY AS NEEDED FOR FLUID (Patient taking differently: take '40mg'$ s by mouth every morning) 90 tablet 3  . glipiZIDE (GLUCOTROL) 5 MG tablet TAKE 2 TABLETS IN THE MORNING AND 1 TABLET IN THE EVENING. 270 tablet 1  . labetalol (NORMODYNE) 300 MG tablet TAKE 1 TABLET TWICE DAILY. 180 tablet 3  . Liniments (SALONPAS PAIN RELIEF PATCH EX)  Apply 1 patch topically daily as needed (back pain).    Marland Kitchen losartan (COZAAR) 100 MG tablet Take 1 tablet (100 mg total) by mouth daily. 90 tablet 3  . meloxicam (MOBIC) 7.5 MG tablet TAKE 1 TABLET ONCE DAILY. 90 tablet 0  . metFORMIN (GLUCOPHAGE) 500 MG tablet TAKE 1 TABLET TWICE DAILY. 180 tablet 1  . ONE TOUCH ULTRA TEST test strip CHECK BLOOD SUGAR ONCE A DAY. 100 each 0  . rosuvastatin (CRESTOR) 20 MG tablet Take 1 tablet (20 mg total) by mouth daily. 90 tablet 1  . traMADol (ULTRAM) 50 MG tablet TAKE 1 TABLET DAILY AS NEEDED FOR SEVERE PAIN. 30 tablet 0  . Triamcinolone Acetonide (NASACORT ALLERGY 24HR NA) Place 1 spray into the nose 2 (two) times daily as needed (congestion).    . vitamin E 400 UNIT capsule Take 400 Units by mouth daily.     No current facility-administered medications for this visit.     Physical Exam BP 136/74   Pulse 84   Ht '5\' 4"'$  (1.626 m)   Wt 156 lb (70.8 kg)   SpO2 94%   BMI 26.40 kg/m  76 year old woman in no acute distress Alert and  oriented x3 with no focal deficits Lungs slightly diminished at right base but otherwise clear Cardiac regular rate and rhythm normal S1 and S2 Incisions well-healed  Diagnostic Tests: CHEST  2 VIEW  COMPARISON:  03/03/2017  FINDINGS: Normal heart size. No pleural effusion or interstitial edema. Post- operative change within the right lung with volume loss and asymmetric elevation of the right hemidiaphragm noted. No superimposed airspace consolidation. The visualized osseous structures are unremarkable.  IMPRESSION: 1. No acute findings. 2. Stable postsurgical changes from right middle lobectomy.   Electronically Signed   By: Kerby Moors M.D.   On: 09/15/2017 12:11 I personally reviewed the chest x-ray images.  There is some improvement in the elevation of the right hemidiaphragm.  There also has been resolution of some platelike atelectasis in the right lung base  Impression: Sheila Melton is a 76 year old woman who had a thoracoscopic right middle lobectomy 6 months ago for a benign hamartoma.  She is doing well at this time with no significant problems with pain or shortness of breath following her VATS.  Benign hamartoma-resected, no further follow-up needed.  Plan: I will be happy to see Mrs. Line back at any time if I can be of any further assistance with her care.  [name], MD [facility] [phone]

## 2017-10-12 ENCOUNTER — Other Ambulatory Visit: Payer: Self-pay | Admitting: Internal Medicine

## 2017-10-12 NOTE — Telephone Encounter (Signed)
[place] Controlled Substance Database checked. Last filled on 07/20/17.

## 2017-10-12 NOTE — Telephone Encounter (Signed)
[place] Controlled Substance Database checked. Last filled on 07/21/17

## 2017-10-15 DIAGNOSIS — D631 Anemia in chronic kidney disease: Secondary | ICD-10-CM | POA: Diagnosis not present

## 2017-10-15 DIAGNOSIS — N183 Chronic kidney disease, stage 3 (moderate): Secondary | ICD-10-CM | POA: Diagnosis not present

## 2017-10-16 ENCOUNTER — Telehealth: Payer: Self-pay | Admitting: Internal Medicine

## 2017-10-16 MED ORDER — GLIPIZIDE 5 MG PO TABS: ORAL_TABLET | ORAL | 0 refills | Status: DC

## 2017-10-16 NOTE — Telephone Encounter (Signed)
This is the correct dosing and RX sent

## 2017-10-16 NOTE — Addendum Note (Signed)
Addended by: Drucilla Schmidt on: 10/16/2017 10:52 AM   Modules accepted: Orders

## 2017-10-16 NOTE — Telephone Encounter (Signed)
[place] [phone] calling pt is insisting that she takesglipizide 5 mg 1 tab in AM and 2 tabs in PM can we send in a new rx for the pt if this is the correct dosing.

## 2017-10-22 DIAGNOSIS — N183 Chronic kidney disease, stage 3 (moderate): Secondary | ICD-10-CM | POA: Diagnosis not present

## 2017-10-22 DIAGNOSIS — I129 Hypertensive chronic kidney disease with stage 1 through stage 4 chronic kidney disease, or unspecified chronic kidney disease: Secondary | ICD-10-CM | POA: Diagnosis not present

## 2017-10-22 DIAGNOSIS — D631 Anemia in chronic kidney disease: Secondary | ICD-10-CM | POA: Diagnosis not present

## 2017-10-22 DIAGNOSIS — N051 Unspecified nephritic syndrome with focal and segmental glomerular lesions: Secondary | ICD-10-CM | POA: Diagnosis not present

## 2017-10-22 DIAGNOSIS — N2581 Secondary hyperparathyroidism of renal origin: Secondary | ICD-10-CM | POA: Diagnosis not present

## 2017-11-15 ENCOUNTER — Encounter: Payer: Self-pay | Admitting: Internal Medicine

## 2017-11-15 NOTE — Progress Notes (Signed)
Subjective:    Patient ID: Sheila Melton, female    DOB: 28-Jan-1941, 77 y.o.   MRN: [id number]  HPI She is here for a physical exam.   She has no concerns.  She denies changes in her health since she was here last.   Medications and allergies reviewed with patient and updated if appropriate.  Patient Active Problem List   Diagnosis Date Noted  . Skin yeast infection 02/26/2017  . 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 Osteoporosis 12/25/2016  . Depression 08/15/2016  . 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 CKD (chronic kidney disease) 05/21/2016  . Sleep difficulties 01/18/2016  . Diabetes mellitus ([place]) 10/23/2015  . Numbness of toes-Right foot 06/26/2015  . Colitis 03/19/2015  . Nephrolithiasis 03/19/2015  . AAA (abdominal aortic aneurysm) without rupture ([place]) 03/19/2015  . Anxiety state 03/19/2015  . Skin cancer 01/09/2015  . Former smoker 05/24/2014  . Bilateral renal cysts 09/16/2012  . 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 Degenerative spondylolisthesis 07/26/2012  . SHOULDER PAIN, LEFT, CHRONIC 07/25/2009  . CARDIAC MURMUR, SYSTOLIC 73/41/9379  . Mixed hyperlipidemia 12/30/2007  . Tobacco use disorder 12/30/2007  . Essential hypertension 12/30/2007  . Subclavian steal syndrome 12/30/2007  . CAROTID BRUIT 04/07/2007    Current Outpatient Medications on File Prior to Visit  Medication Sig Dispense Refill  . amLODipine (NORVASC) 10 MG tablet Take 1 tablet (10 mg total) by mouth daily. 30 tablet 1  . Ascorbic Acid (VITAMIN C) 250 MG CHEW Chew 500 mg by mouth daily.    Marland Kitchen aspirin EC 81 MG tablet Take 1 tablet (81 mg total) by mouth daily. 90 tablet 3  . B Complex-C (SUPER B COMPLEX PO) Take 1 tablet by mouth daily.     . Blood Glucose Monitoring Suppl (ONE TOUCH ULTRA 2) w/Device KIT Use as advised 1 each 0  . Cholecalciferol (VITAMIN D3) 2000 UNITS TABS Take 2,000 Units by mouth daily.     . diazepam (VALIUM) 5 MG  tablet TAKE 1 TABLET AT BEDTIME AS NEEDED. 90 tablet 0  . fexofenadine (ALLEGRA) 180 MG tablet Take 180 mg by mouth at bedtime.     Marland Kitchen FLUoxetine (PROZAC) 10 MG capsule TAKE 1 CAPSULE DAILY. 90 capsule 0  . furosemide (LASIX) 40 MG tablet TAKE 1/2 TO 1 TABLET DAILY AS NEEDED FOR FLUID (Patient taking differently: take '40mg'$ s by mouth every morning) 90 tablet 3  . glipiZIDE (GLUCOTROL) 5 MG tablet Take '5mg'$  before breakfast and '10mg'$  before dinner 270 tablet 0  . labetalol (NORMODYNE) 300 MG tablet TAKE 1 TABLET TWICE DAILY. 180 tablet 3  . Liniments (SALONPAS PAIN RELIEF PATCH EX) Apply 1 patch topically daily as needed (back pain).    Marland Kitchen losartan (COZAAR) 100 MG tablet Take 1 tablet (100 mg total) by mouth daily. 90 tablet 3  . meloxicam (MOBIC) 7.5 MG tablet TAKE 1 TABLET ONCE DAILY. 90 tablet 0  . metFORMIN (GLUCOPHAGE) 500 MG tablet TAKE 1 TABLET TWICE DAILY. 180 tablet 1  . ONE TOUCH ULTRA TEST test strip CHECK BLOOD SUGAR ONCE A DAY. 100 each 0  . rosuvastatin (CRESTOR) 20 MG tablet Take 1 tablet (20 mg total) by mouth daily. 90 tablet 1  . traMADol (ULTRAM) 50 MG tablet TAKE 1 TABLET DAILY AS NEEDED FOR SEVERE PAIN. 30 tablet 0  . Triamcinolone Acetonide (NASACORT ALLERGY 24HR NA) Place 1 spray into the nose 2 (two) times daily as needed (congestion).    Marland Kitchen  vitamin E 400 UNIT capsule Take 400 Units by mouth daily.     No current facility-administered medications on file prior to visit.     Past Medical History:  Diagnosis Date  . AAA (abdominal aortic aneurysm) ([facility])    BEING WATCHED   [facility]. 4.2 cm infrarenal abdominal aortic aneurysm without rupture noted 11/08/2016  . Arthritis   . Cancer ([place])    skin cancer removed from left shoulder  2017  . CAP (community acquired pneumonia) 09/02/13-11/05/13    with SIRS  . Depression    ?? from prednisone    not on pred now  . Diabetes mellitus    type ii    long time ago.  . Family history of anesthesia complication    PATIENTS MOM HAD  TROUBLE WAKING UP   . GERD (gastroesophageal reflux disease)    will take occasional tums  . Heart murmur    still has.   Sees Dr. Stanford Breed  . History of kidney stones    has them at the present time  . Hyperlipidemia   . Hypertension    unspecified essential  . Renal insufficiency    "FSGS"  . Subclavian steal syndrome    LEFT SIDE   NO SIDE EFFECTS  . Subclavian steal syndrome --  left    85% blocked  . UTI (lower urinary tract infection) 08/26/13   Enterococcus and Escherichia coli both sensitive to nitrofurantoin    Past Surgical History:  Procedure Laterality Date  . ABDOMINAL HYSTERECTOMY    . BACK SURGERY    . CATARACT EXTRACTION     OS  . CYST EXCISION Right 05-23-13   Right index finger: cyst  . DILATION AND CURETTAGE OF UTERUS    . EYE SURGERY    . g1 p1    . SPINE SURGERY  Sept. 2013  . VIDEO ASSISTED THORACOSCOPY (VATS)/ LOBECTOMY Right 02/16/2017   Procedure: Right VIDEO ASSISTED THORACOSCOPY (VATS)/ Right Middle LOBECTOMY;  Surgeon: [name], MD;  Location: [facility];  Service: Thoracic;  Laterality: Right;    Social History   Socioeconomic History  . Marital status: Married    Spouse name: None  . Number of children: 1  . Years of education: None  . Highest education level: None  Social Needs  . Financial resource strain: None  . Food insecurity - worry: None  . Food insecurity - inability: None  . Transportation needs - medical: None  . Transportation needs - non-medical: None  Occupational History  . None  Tobacco Use  . Smoking status: Light Tobacco Smoker    Packs/day: 1.00    Years: 50.00    Pack years: 50.00    Types: E-cigarettes  . Smokeless tobacco: Never Used  . Tobacco comment: e-cigs  Substance and Sexual Activity  . Alcohol use: No  . Drug use: No  . Sexual activity: None  Other Topics Concern  . None  Social History Narrative   Has 3 caffeinated bev a day      Exercise: none    Family History  Problem Relation  Age of Onset  . Heart failure Mother   . Heart disease Mother   . Hypertension Mother   . Other Mother        varicose veins  . Deep vein thrombosis Mother   . Varicose Veins Mother   . Heart attack Mother   . Hypertension Sister        X 2  . Diabetes  Sister   . Hyperlipidemia Sister   . Other Sister        varicose veins  . Heart disease Father   . Diabetes Father   . Hyperlipidemia Father   . Hypertension Father   . Heart attack Father   . Heart disease Brother        MI in late 81s  . Hyperlipidemia Brother   . Heart attack Brother   . Coronary artery disease Unknown   . Diabetes Unknown   . Parkinsonism Brother   . Heart failure Brother   . Hypertension Daughter     Review of Systems  Constitutional: Negative for chills and fever.  Eyes: Negative for visual disturbance.  Respiratory: Negative for cough, shortness of breath and wheezing.   Cardiovascular: Negative for chest pain, palpitations and leg swelling.  Gastrointestinal: Positive for abdominal pain (occ lower abdominal pain). Negative for blood in stool, constipation, diarrhea and nausea.  Genitourinary: Negative for dysuria and hematuria.  Musculoskeletal: Positive for back pain. Negative for arthralgias and myalgias.  Skin: Positive for color change (will see derm). Negative for rash.  Neurological: Negative for light-headedness and headaches.  Psychiatric/Behavioral: Positive for dysphoric mood (controlled for the most part). The patient is nervous/anxious (controlled for the most part).        Objective:   Vitals:   11/16/17 1412  BP: 128/60  Pulse: 89  Resp: 16  Temp: 98.9 F (37.2 C)  SpO2: 98%   Filed Weights   11/16/17 1412  Weight: 159 lb (72.1 kg)   Body mass index is 27.29 kg/m.  Wt Readings from Last 3 Encounters:  11/16/17 159 lb (72.1 kg)  09/15/17 156 lb (70.8 kg)  08/18/17 156 lb (70.8 kg)     Physical Exam Constitutional: She appears well-developed and well-nourished.  No distress.  HENT:  Head: Normocephalic and atraumatic.  Right Ear: External ear normal. Normal ear canal and TM Left Ear: External ear normal.  Normal ear canal and TM Mouth/Throat: Oropharynx is clear and moist.  Eyes: Conjunctivae and EOM are normal.  Neck: Neck supple. No tracheal deviation present. No thyromegaly present.  No carotid bruit  Cardiovascular: Normal rate, regular rhythm and normal heart sounds.   No murmur heard.  No edema. Pulmonary/Chest: Effort normal and breath sounds normal. No respiratory distress. She has no wheezes. She has no rales.  Breast: deferred to Gyn Abdominal: Soft. She exhibits no distension. There is no tenderness.  Lymphadenopathy: She has no cervical adenopathy.  Skin: Skin is warm and dry. She is not diaphoretic.  Psychiatric: She has a normal mood and affect. Her behavior is normal.   Diabetic Foot Exam - Simple   Simple Foot Form Diabetic Foot exam was performed with the following findings:  Yes 11/16/2017  2:49 PM  Visual Inspection No deformities, no ulcerations, no other skin breakdown bilaterally:  Yes Sensation Testing Intact to touch and monofilament testing bilaterally:  Yes Pulse Check Posterior Tibialis and Dorsalis pulse intact bilaterally:  Yes Comments         Assessment & Plan:   Physical exam: Screening blood work  ordered Immunizations   Td and shingrix discussed, others up to date Colonoscopy  Up to date  Mammogram   Not up to date - will schedule Dexa   Up to date  - consider prolia Eye exams  Up to date - dr Alois Cliche EKG   last done 02/2017 Exercise  -  Active around the house, no regular exercise  Weight overweight  - encouraged weight loss Skin  - sees derm, some changes in color Substance abuse   Smokes e-cig's most days - not trying to quit, no alcohol use    See Problem List for Assessment and Plan of chronic medical problems.     FU in 6 months

## 2017-11-15 NOTE — Patient Instructions (Addendum)
Test(s) ordered today. Your results will be released to [facility] (or called to you) after review, usually within 72hours after test completion. If any changes need to be made, you will be notified at that same time.  All other Health Maintenance issues reviewed.   All recommended immunizations and age-appropriate screenings are up-to-date or discussed.  No immunizations administered today.   Medications reviewed and updated.  No changes recommended at this time.   Please followup in 6 months   Denosumab injection What is this medicine? DENOSUMAB (den oh sue mab) slows bone breakdown. Prolia is used to treat osteoporosis in women after menopause and in men. Delton See is used to treat a high calcium level due to cancer and to prevent bone fractures and other bone problems caused by multiple myeloma or cancer bone metastases. Delton See is also used to treat giant cell tumor of the bone. This medicine may be used for other purposes; ask your health care provider or pharmacist if you have questions. COMMON BRAND NAME(S): Prolia, XGEVA What should I tell my health care provider before I take this medicine? They need to know if you have any of these conditions: -dental disease -having surgery or tooth extraction -infection -kidney disease -low levels of calcium or Vitamin D in the blood -malnutrition -on hemodialysis -skin conditions or sensitivity -thyroid or parathyroid disease -an unusual reaction to denosumab, other medicines, foods, dyes, or preservatives -pregnant or trying to get pregnant -breast-feeding How should I use this medicine? This medicine is for injection under the skin. It is given by a health care professional in a hospital or clinic setting. If you are getting Prolia, a special MedGuide will be given to you by the pharmacist with each prescription and refill. Be sure to read this information carefully each time. For Prolia, talk to your pediatrician regarding the use of this  medicine in children. Special care may be needed. For Delton See, talk to your pediatrician regarding the use of this medicine in children. While this drug may be prescribed for children as young as 13 years for selected conditions, precautions do apply. Overdosage: If you think you have taken too much of this medicine contact a poison control center or emergency room at once. NOTE: This medicine is only for you. Do not share this medicine with others. What if I miss a dose? It is important not to miss your dose. Call your doctor or health care professional if you are unable to keep an appointment. What may interact with this medicine? Do not take this medicine with any of the following medications: -other medicines containing denosumab This medicine may also interact with the following medications: -medicines that lower your chance of fighting infection -steroid medicines like prednisone or cortisone This list may not describe all possible interactions. Give your health care provider a list of all the medicines, herbs, non-prescription drugs, or dietary supplements you use. Also tell them if you smoke, drink alcohol, or use illegal drugs. Some items may interact with your medicine. What should I watch for while using this medicine? Visit your doctor or health care professional for regular checks on your progress. Your doctor or health care professional may order blood tests and other tests to see how you are doing. Call your doctor or health care professional for advice if you get a fever, chills or sore throat, or other symptoms of a cold or flu. Do not treat yourself. This drug may decrease your body's ability to fight infection. Try to avoid being around  people who are sick. You should make sure you get enough calcium and vitamin D while you are taking this medicine, unless your doctor tells you not to. Discuss the foods you eat and the vitamins you take with your health care professional. See your  dentist regularly. Brush and floss your teeth as directed. Before you have any dental work done, tell your dentist you are receiving this medicine. Do not become pregnant while taking this medicine or for 5 months after stopping it. Talk with your doctor or health care professional about your birth control options while taking this medicine. Women should inform their doctor if they wish to become pregnant or think they might be pregnant. There is a potential for serious side effects to an unborn child. Talk to your health care professional or pharmacist for more information. What side effects may I notice from receiving this medicine? Side effects that you should report to your doctor or health care professional as soon as possible: -allergic reactions like skin rash, itching or hives, swelling of the face, lips, or tongue -bone pain -breathing problems -dizziness -jaw pain, especially after dental work -redness, blistering, peeling of the skin -signs and symptoms of infection like fever or chills; cough; sore throat; pain or trouble passing urine -signs of low calcium like fast heartbeat, muscle cramps or muscle pain; pain, tingling, numbness in the hands or feet; seizures -unusual bleeding or bruising -unusually weak or tired Side effects that usually do not require medical attention (report to your doctor or health care professional if they continue or are bothersome): -constipation -diarrhea -headache -joint pain -loss of appetite -muscle pain -runny nose -tiredness -upset stomach This list may not describe all possible side effects. Call your doctor for medical advice about side effects. You may report side effects to FDA at 1-800-FDA-1088. Where should I keep my medicine? This medicine is only given in a clinic, doctor's office, or other health care setting and will not be stored at home. NOTE: This sheet is a summary. It may not cover all possible information. If you have questions  about this medicine, talk to your doctor, pharmacist, or health care provider.  2018 Elsevier/Gold Standard (2016-11-18 19:17:21)    Health Maintenance, Female Adopting a healthy lifestyle and getting preventive care can go a long way to promote health and wellness. Talk with your health care provider about what schedule of regular examinations is right for you. This is a good chance for you to check in with your provider about disease prevention and staying healthy. In between checkups, there are plenty of things you can do on your own. Experts have done a lot of research about which lifestyle changes and preventive measures are most likely to keep you healthy. Ask your health care provider for more information. Weight and diet Eat a healthy diet  Be sure to include plenty of vegetables, fruits, low-fat dairy products, and lean protein.  Do not eat a lot of foods high in solid fats, added sugars, or salt.  Get regular exercise. This is one of the most important things you can do for your health. ? Most adults should exercise for at least 150 minutes each week. The exercise should increase your heart rate and make you sweat (moderate-intensity exercise). ? Most adults should also do strengthening exercises at least twice a week. This is in addition to the moderate-intensity exercise.  Maintain a healthy weight  Body mass index (BMI) is a measurement that can be used to identify possible  weight problems. It estimates body fat based on height and weight. Your health care provider can help determine your BMI and help you achieve or maintain a healthy weight.  For females 27 years of age and older: ? A BMI below 18.5 is considered underweight. ? A BMI of 18.5 to 24.9 is normal. ? A BMI of 25 to 29.9 is considered overweight. ? A BMI of 30 and above is considered obese.  Watch levels of cholesterol and blood lipids  You should start having your blood tested for lipids and cholesterol at 75  years of age, then have this test every 5 years.  You may need to have your cholesterol levels checked more often if: ? Your lipid or cholesterol levels are high. ? You are older than 75 years of age. ? You are at high risk for heart disease.  Cancer screening Lung Cancer  Lung cancer screening is recommended for adults 45-2 years old who are at high risk for lung cancer because of a history of smoking.  A yearly low-dose CT scan of the lungs is recommended for people who: ? Currently smoke. ? Have quit within the past 15 years. ? Have at least a 30-pack-year history of smoking. A pack year is smoking an average of one pack of cigarettes a day for 1 year.  Yearly screening should continue until it has been 15 years since you quit.  Yearly screening should stop if you develop a health problem that would prevent you from having lung cancer treatment.  Breast Cancer  Practice breast self-awareness. This means understanding how your breasts normally appear and feel.  It also means doing regular breast self-exams. Let your health care provider know about any changes, no matter how small.  If you are in your 20s or 30s, you should have a clinical breast exam (CBE) by a health care provider every 1-3 years as part of a regular health exam.  If you are 70 or older, have a CBE every year. Also consider having a breast X-ray (mammogram) every year.  If you have a family history of breast cancer, talk to your health care provider about genetic screening.  If you are at high risk for breast cancer, talk to your health care provider about having an MRI and a mammogram every year.  Breast cancer gene (BRCA) assessment is recommended for women who have family members with BRCA-related cancers. BRCA-related cancers include: ? Breast. ? Ovarian. ? Tubal. ? Peritoneal cancers.  Results of the assessment will determine the need for genetic counseling and BRCA1 and BRCA2 testing.  Cervical  Cancer Your health care provider may recommend that you be screened regularly for cancer of the pelvic organs (ovaries, uterus, and vagina). This screening involves a pelvic examination, including checking for microscopic changes to the surface of your cervix (Pap test). You may be encouraged to have this screening done every 3 years, beginning at age 63.  For women ages 45-65, health care providers may recommend pelvic exams and Pap testing every 3 years, or they may recommend the Pap and pelvic exam, combined with testing for human papilloma virus (HPV), every 5 years. Some types of HPV increase your risk of cervical cancer. Testing for HPV may also be done on women of any age with unclear Pap test results.  Other health care providers may not recommend any screening for nonpregnant women who are considered low risk for pelvic cancer and who do not have symptoms. Ask your health care provider  if a screening pelvic exam is right for you.  If you have had past treatment for cervical cancer or a condition that could lead to cancer, you need Pap tests and screening for cancer for at least 20 years after your treatment. If Pap tests have been discontinued, your risk factors (such as having a new sexual partner) need to be reassessed to determine if screening should resume. Some women have medical problems that increase the chance of getting cervical cancer. In these cases, your health care provider may recommend more frequent screening and Pap tests.  Colorectal Cancer  This type of cancer can be detected and often prevented.  Routine colorectal cancer screening usually begins at 75 years of age and continues through 75 years of age.  Your health care provider may recommend screening at an earlier age if you have risk factors for colon cancer.  Your health care provider may also recommend using home test kits to check for hidden blood in the stool.  A small camera at the end of a tube can be used to  examine your colon directly (sigmoidoscopy or colonoscopy). This is done to check for the earliest forms of colorectal cancer.  Routine screening usually begins at age 71.  Direct examination of the colon should be repeated every 5-10 years through 75 years of age. However, you may need to be screened more often if early forms of precancerous polyps or small growths are found.  Skin Cancer  Check your skin from head to toe regularly.  Tell your health care provider about any new moles or changes in moles, especially if there is a change in a mole's shape or color.  Also tell your health care provider if you have a mole that is larger than the size of a pencil eraser.  Always use sunscreen. Apply sunscreen liberally and repeatedly throughout the day.  Protect yourself by wearing long sleeves, pants, a wide-brimmed hat, and sunglasses whenever you are outside.  Heart disease, diabetes, and high blood pressure  High blood pressure causes heart disease and increases the risk of stroke. High blood pressure is more likely to develop in: ? People who have blood pressure in the high end of the normal range (130-139/85-89 mm Hg). ? People who are overweight or obese. ? People who are African American.  If you are 44-66 years of age, have your blood pressure checked every 3-5 years. If you are 61 years of age or older, have your blood pressure checked every year. You should have your blood pressure measured twice-once when you are at a hospital or clinic, and once when you are not at a hospital or clinic. Record the average of the two measurements. To check your blood pressure when you are not at a hospital or clinic, you can use: ? An automated blood pressure machine at a pharmacy. ? A home blood pressure monitor.  If you are between 56 years and 17 years old, ask your health care provider if you should take aspirin to prevent strokes.  Have regular diabetes screenings. This involves taking a  blood sample to check your fasting blood sugar level. ? If you are at a normal weight and have a low risk for diabetes, have this test once every three years after 75 years of age. ? If you are overweight and have a high risk for diabetes, consider being tested at a younger age or more often. Preventing infection Hepatitis B  If you have a higher risk for  hepatitis B, you should be screened for this virus. You are considered at high risk for hepatitis B if: ? You were born in a country where hepatitis B is common. Ask your health care provider which countries are considered high risk. ? Your parents were born in a high-risk country, and you have not been immunized against hepatitis B (hepatitis B vaccine). ? You have HIV or AIDS. ? You use needles to inject street drugs. ? You live with someone who has hepatitis B. ? You have had sex with someone who has hepatitis B. ? You get hemodialysis treatment. ? You take certain medicines for conditions, including cancer, organ transplantation, and autoimmune conditions.  Hepatitis C  Blood testing is recommended for: ? Everyone born from [birth year] through 1963. ? Anyone with known risk factors for hepatitis C.  Sexually transmitted infections (STIs)  You should be screened for sexually transmitted infections (STIs) including gonorrhea and chlamydia if: ? You are sexually active and are younger than 75 years of age. ? You are older than 75 years of age and your health care provider tells you that you are at risk for this type of infection. ? Your sexual activity has changed since you were last screened and you are at an increased risk for chlamydia or gonorrhea. Ask your health care provider if you are at risk.  If you do not have HIV, but are at risk, it may be recommended that you take a prescription medicine daily to prevent HIV infection. This is called pre-exposure prophylaxis (PrEP). You are considered at risk if: ? You are sexually active and  do not regularly use condoms or know the HIV status of your partner(s). ? You take drugs by injection. ? You are sexually active with a partner who has HIV.  Talk with your health care provider about whether you are at high risk of being infected with HIV. If you choose to begin PrEP, you should first be tested for HIV. You should then be tested every 3 months for as long as you are taking PrEP. Pregnancy  If you are premenopausal and you may become pregnant, ask your health care provider about preconception counseling.  If you may become pregnant, take 400 to 800 micrograms (mcg) of folic acid every day.  If you want to prevent pregnancy, talk to your health care provider about birth control (contraception). Osteoporosis and menopause  Osteoporosis is a disease in which the bones lose minerals and strength with aging. This can result in serious bone fractures. Your risk for osteoporosis can be identified using a bone density scan.  If you are 74 years of age or older, or if you are at risk for osteoporosis and fractures, ask your health care provider if you should be screened.  Ask your health care provider whether you should take a calcium or vitamin D supplement to lower your risk for osteoporosis.  Menopause may have certain physical symptoms and risks.  Hormone replacement therapy may reduce some of these symptoms and risks. Talk to your health care provider about whether hormone replacement therapy is right for you. Follow these instructions at home:  Schedule regular health, dental, and eye exams.  Stay current with your immunizations.  Do not use any tobacco products including cigarettes, chewing tobacco, or electronic cigarettes.  If you are pregnant, do not drink alcohol.  If you are breastfeeding, limit how much and how often you drink alcohol.  Limit alcohol intake to no more than 1 drink per  day for nonpregnant women. One drink equals 12 ounces of beer, 5 ounces of  wine, or 1 ounces of hard liquor.  Do not use street drugs.  Do not share needles.  Ask your health care provider for help if you need support or information about quitting drugs.  Tell your health care provider if you often feel depressed.  Tell your health care provider if you have ever been abused or do not feel safe at home. This information is not intended to replace advice given to you by your health care provider. Make sure you discuss any questions you have with your health care provider. Document Released: 05/12/2011 Document Revised: 04/03/2016 Document Reviewed: 07/31/2015 Elsevier Interactive Patient Education  [employer].

## 2017-11-16 ENCOUNTER — Encounter: Payer: Self-pay | Admitting: Internal Medicine

## 2017-11-16 ENCOUNTER — Other Ambulatory Visit (INDEPENDENT_AMBULATORY_CARE_PROVIDER_SITE_OTHER): Payer: PPO

## 2017-11-16 ENCOUNTER — Ambulatory Visit (INDEPENDENT_AMBULATORY_CARE_PROVIDER_SITE_OTHER): Payer: PPO | Admitting: Internal Medicine

## 2017-11-16 VITALS — BP 128/60 | HR 89 | Temp 98.9°F | Resp 16 | Ht 64.0 in | Wt 159.0 lb

## 2017-11-16 DIAGNOSIS — N189 Chronic kidney disease, unspecified: Secondary | ICD-10-CM | POA: Diagnosis not present

## 2017-11-16 DIAGNOSIS — E782 Mixed hyperlipidemia: Secondary | ICD-10-CM | POA: Diagnosis not present

## 2017-11-16 DIAGNOSIS — E1159 Type 2 diabetes mellitus with other circulatory complications: Secondary | ICD-10-CM

## 2017-11-16 DIAGNOSIS — M810 Age-related osteoporosis without current pathological fracture: Secondary | ICD-10-CM

## 2017-11-16 DIAGNOSIS — I10 Essential (primary) hypertension: Secondary | ICD-10-CM

## 2017-11-16 DIAGNOSIS — M4807 Spinal stenosis, lumbosacral region: Secondary | ICD-10-CM | POA: Diagnosis not present

## 2017-11-16 DIAGNOSIS — F411 Generalized anxiety disorder: Secondary | ICD-10-CM | POA: Diagnosis not present

## 2017-11-16 DIAGNOSIS — F3289 Other specified depressive episodes: Secondary | ICD-10-CM | POA: Diagnosis not present

## 2017-11-16 DIAGNOSIS — Z0000 Encounter for general adult medical examination without abnormal findings: Secondary | ICD-10-CM | POA: Diagnosis not present

## 2017-11-16 DIAGNOSIS — G9519 Other vascular myelopathies: Secondary | ICD-10-CM | POA: Diagnosis not present

## 2017-11-16 DIAGNOSIS — G479 Sleep disorder, unspecified: Secondary | ICD-10-CM

## 2017-11-16 LAB — COMPREHENSIVE METABOLIC PANEL
ALT: 13 U/L (ref 0–35)
AST: 15 U/L (ref 0–37)
Albumin: 4.3 g/dL (ref 3.5–5.2)
Alkaline Phosphatase: 74 U/L (ref 39–117)
BILIRUBIN TOTAL: 0.3 mg/dL (ref 0.2–1.2)
BUN: 21 mg/dL (ref 6–23)
CO2: 27 mEq/L (ref 19–32)
CREATININE: 1.45 mg/dL — AB (ref 0.40–1.20)
Calcium: 9.5 mg/dL (ref 8.4–10.5)
Chloride: 103 mEq/L (ref 96–112)
GFR: 37.43 mL/min — ABNORMAL LOW (ref >60.00)
GLUCOSE: 123 mg/dL — AB (ref 70–99)
Potassium: 3.5 mEq/L (ref 3.5–5.1)
SODIUM: 140 meq/L (ref 135–145)
TOTAL PROTEIN: 7.1 g/dL (ref 6.0–8.3)

## 2017-11-16 LAB — CBC WITH DIFFERENTIAL/PLATELET
Basophils Absolute: 0.1 10*3/uL (ref 0.0–0.1)
Basophils Relative: 0.8 % (ref 0.0–3.0)
EOS PCT: 4.6 % (ref 0.0–5.0)
Eosinophils Absolute: 0.5 10*3/uL (ref 0.0–0.7)
HCT: 36.5 % (ref 36.0–46.0)
Hemoglobin: 12 g/dL (ref 12.0–15.0)
LYMPHS ABS: 3.6 10*3/uL (ref 0.7–4.0)
Lymphocytes Relative: 30.6 % (ref 12.0–46.0)
MCHC: 32.8 g/dL (ref 30.0–36.0)
MCV: 86.9 fl (ref 78.0–100.0)
MONO ABS: 1.2 10*3/uL — AB (ref 0.1–1.0)
Monocytes Relative: 10.4 % (ref 3.0–12.0)
NEUTROS PCT: 53.6 % (ref 43.0–77.0)
Neutro Abs: 6.3 10*3/uL (ref 1.4–7.7)
Platelets: 363 10*3/uL (ref 150.0–400.0)
RBC: 4.2 Mil/uL (ref 3.87–5.11)
RDW: 14 % (ref 11.5–15.5)
WBC: 11.8 10*3/uL — ABNORMAL HIGH (ref 4.0–10.5)

## 2017-11-16 LAB — TSH: TSH: 2.61 u[IU]/mL (ref 0.35–4.50)

## 2017-11-16 LAB — LDL CHOLESTEROL, DIRECT: Direct LDL: 62 mg/dL

## 2017-11-16 LAB — LIPID PANEL
CHOLESTEROL: 143 mg/dL (ref 0–200)
HDL: 35.8 mg/dL — ABNORMAL LOW (ref >39.00)
NonHDL: 106.72
Total CHOL/HDL Ratio: 4
Triglycerides: 313 mg/dL — ABNORMAL HIGH (ref 0.0–149.0)
VLDL: 62.6 mg/dL — AB (ref 0.0–40.0)

## 2017-11-16 LAB — HEMOGLOBIN A1C: HEMOGLOBIN A1C: 6.7 % — AB (ref 4.6–6.5)

## 2017-11-16 NOTE — Assessment & Plan Note (Signed)
Taking tramadol as needed for pain - typically 1-2 tabs a day No nsaids F/u in 6 months

## 2017-11-16 NOTE — Assessment & Plan Note (Signed)
BP well controlled Current regimen effective and well tolerated Continue current medications at current doses cmp  

## 2017-11-16 NOTE — Assessment & Plan Note (Signed)
Improved/controlled on prozac Continue current dose

## 2017-11-16 NOTE — Assessment & Plan Note (Signed)
Taking prozac Taking valium as needed at bedtime

## 2017-11-16 NOTE — Assessment & Plan Note (Signed)
Takes valium nightly for anxiety/sleep

## 2017-11-16 NOTE — Assessment & Plan Note (Signed)
Check lipid panel  Continue daily statin Regular exercise and healthy diet encouraged  

## 2017-11-16 NOTE — Assessment & Plan Note (Addendum)
dexa up to date Minimal exercise Taking calcium and vitamin  Consider prolia - information given

## 2017-11-16 NOTE — Assessment & Plan Note (Addendum)
Related to [facility] - stable, no change Monitored by [facility] Not on any medication

## 2017-11-16 NOTE — Assessment & Plan Note (Signed)
Following with Dr Cruzita Lederer Will check a1c

## 2018-01-08 ENCOUNTER — Other Ambulatory Visit: Payer: Self-pay | Admitting: Internal Medicine

## 2018-01-08 NOTE — Telephone Encounter (Signed)
[place] Controlled Substance Database checked. Last filled on 10/17/17

## 2018-02-09 ENCOUNTER — Other Ambulatory Visit: Payer: Self-pay | Admitting: Internal Medicine

## 2018-02-09 DIAGNOSIS — H40013 Open angle with borderline findings, low risk, bilateral: Secondary | ICD-10-CM | POA: Diagnosis not present

## 2018-02-09 NOTE — Telephone Encounter (Signed)
[place] Controlled Substance Database checked. Last filled on 10/17/17

## 2018-02-11 ENCOUNTER — Encounter: Payer: Self-pay | Admitting: Internal Medicine

## 2018-03-02 ENCOUNTER — Encounter: Payer: Self-pay | Admitting: Family

## 2018-03-02 ENCOUNTER — Ambulatory Visit: Payer: PPO | Admitting: Family

## 2018-03-02 ENCOUNTER — Other Ambulatory Visit: Payer: Self-pay

## 2018-03-02 ENCOUNTER — Ambulatory Visit (HOSPITAL_COMMUNITY)
Admission: RE | Admit: 2018-03-02 | Discharge: 2018-03-02 | Disposition: A | Discharge status: Home / Self Care | Payer: PPO | Source: Ambulatory Visit | Attending: Family | Admitting: Family

## 2018-03-02 VITALS — BP 146/80 | HR 74 | Temp 97.0°F | Resp 14 | Ht 64.0 in | Wt 159.0 lb

## 2018-03-02 DIAGNOSIS — Z87891 Personal history of nicotine dependence: Secondary | ICD-10-CM

## 2018-03-02 DIAGNOSIS — N281 Cyst of kidney, acquired: Secondary | ICD-10-CM | POA: Diagnosis not present

## 2018-03-02 DIAGNOSIS — I714 Abdominal aortic aneurysm, without rupture, unspecified: Secondary | ICD-10-CM

## 2018-03-02 DIAGNOSIS — Z789 Other specified health status: Secondary | ICD-10-CM | POA: Diagnosis not present

## 2018-03-02 DIAGNOSIS — Z720 Tobacco use: Secondary | ICD-10-CM

## 2018-03-02 NOTE — Patient Instructions (Addendum)
Before your next abdominal ultrasound:  Take two Extra-Strength Gas-X capsules at bedtime the night before the test. Take another two Extra-Strength Gas-X capsules 3 hours before the test.  Avoid gas forming foods and beverages the day before the test.       Abdominal Aortic Aneurysm Blood pumps away from the heart through tubes (blood vessels) called arteries. Aneurysms are weak or damaged places in the wall of an artery. It bulges out like a balloon. An abdominal aortic aneurysm happens in the main artery of the body (aorta). It can burst or tear, causing bleeding inside the body. This is an emergency. It needs treatment right away. What are the causes? The exact cause is unknown. Things that could cause this problem include:  Fat and other substances building up in the lining of a tube.  Swelling of the walls of a blood vessel.  Certain tissue diseases.  Belly (abdominal) trauma.  An infection in the main artery of the body.  What increases the risk? There are things that make it more likely for you to have an aneurysm. These include:  Being over the age of 75 years old.  Having high blood pressure (hypertension).  Being a female.  Being white.  Being very overweight (obese).  Having a family history of aneurysm.  Using tobacco products.  What are the signs or symptoms? Symptoms depend on the size of the aneurysm and how fast it grows. There may not be symptoms. If symptoms occur, they can include:  Pain (belly, side, lower back, or groin).  Feeling full after eating a small amount of food.  Feeling sick to your stomach (nauseous), throwing up (vomiting), or both.  Feeling a lump in your belly that feels like it is beating (pulsating).  Feeling like you will pass out (faint).  How is this treated?  Medicine to control blood pressure and pain.  Imaging tests to see if the aneurysm gets bigger.  Surgery. How is this prevented? To lessen your chance of  getting this condition:  Stop smoking. Stop chewing tobacco.  Limit or avoid alcohol.  Keep your blood pressure, blood sugar, and cholesterol within normal limits.  Eat less salt.  Eat foods low in saturated fats and cholesterol. These are found in animal and whole dairy products.  Eat more fiber. Fiber is found in whole grains, vegetables, and fruits.  Keep a healthy weight.  Stay active and exercise often.  This information is not intended to replace advice given to you by your health care provider. Make sure you discuss any questions you have with your health care provider. Document Released: 02/21/2013 Document Revised: 04/03/2016 Document Reviewed: 11/26/2012 Elsevier Interactive Patient Education  2017 [place] Need to Know About Electronic Cigarettes Electronic cigarettes, or e-cigarettes, are battery-operated devices that deliver nicotine to your body. They come in many shapes, including in the shape of a cigarette, pipe, pen, and even a USB memory stick. E-cigarettes have a cartridge that contains a liquid form of nicotine. When you use the device, the liquid heats up. It then becomes a vapor. Inhaling this vapor is called vaping. While e-cigarettes do not contain tar and the same cancer-causing chemicals that are in tobacco cigarettes, they may contain other harmful and cancer-causing chemicals, such as formaldehyde or acetaldehyde. Nicotine is thought to increase your risk for certain types of cancer. Many e-cigarettes have chemical colorings and flavorings. It is not clear how much nicotine you get when vaping. The  health effects of vaping are not completely known. Some people may use e-cigarettes in order to quit smoking tobacco. However, this has not been proven to work, and the Transport planner (FDA) has not approved e-cigarettes for this purpose. How can using electronic cigarettes affect me?  E-cigarettes contain nicotine, which is a very  addictive drug. Vaping may make you crave nicotine. Nicotine: ? Changes your blood sugar levels. ? Increases your heart rate, blood pressure, and breathing rate. ? Increases your risk of developing blood clots (hypercoaguable state) and diabetes.  If you smoke e-cigarettes, you may be more likely to start smoking or to smoke more tobacco cigarettes.  Becoming addicted to nicotine may make your brain more sensitive to other addictive drugs. You may move to other addictive substances.  If you are pregnant, the nicotine in e-cigarettes may be harmful to your baby. Nicotine can cause: ? Brain or lung problems for your baby. ? Your baby to be born too early. ? Your baby to be born with a low birth weight.  If you are a child or a teen, vaping may affect your memory or lower your attention span.  You may be in danger of overdosing on nicotine. Nicotine poisoning can cause nausea, vomiting, seizures, and trouble breathing. What are the benefits of stopping vaping? If you stop vaping, you can avoid:  Getting addicted to nicotine.  Having nicotine side effects.  Getting nicotine poisoning.  Being exposed to dangerous chemicals.  Increasing your risk of health problems.  Increasing your baby's risk of health problems, if you are pregnant.  Being more likely to use other addictive substances.  What steps can I take to stop vaping? If you can stop vaping on your own, do it before you become addicted to nicotine. If you need help stopping, ask your health care provider. There are three effective ways to fight nicotine addiction:  Nicotine replacement therapy. Using nicotine gum or a nicotine patch blocks your craving for nicotine. Over time, you can reduce the amount of nicotine you use until you can stop using nicotine completely without having cravings.  Prescription medicines approved to fight nicotine addiction. These stop nicotine cravings or block the effects of  nicotine.  Behavioral therapy. This may include: ? A self-help smoking cessation program. ? Individual or group therapy. ? A smoking cessation support group.  Where can I get support? You can get support at these sites:  [place] of Medicine: MusicTeasers.nl  U.S. Department of Health and Human Services: https://smokefree.gov  American Lung Association: WealthToys.de  Where can I get more information? Learn more about e-cigarettes from:  [place]: PicCapture.uy  U.S. Department of Health and Human Services: StoreMirror.com.cy  Summary  E-cigarettes can cause nicotine addiction.  E-cigarettes are not approved as a way to stop smoking.  E-cigarettes are not a risk-free alternative to smoking tobacco.  There are ways to fight nicotine addiction.  Talk to your health care provider if you are unable to stop vaping on your own. This information is not intended to replace advice given to you by your health care provider. Make sure you discuss any questions you have with your health care provider. Document Released: 02/18/2016 Document Revised: 07/16/2016 Document Reviewed: 10/19/2015 Elsevier Interactive Patient Education  [employer].

## 2018-03-02 NOTE — Progress Notes (Signed)
Vitals:   03/02/18 0951  BP: (!) 151/79  Pulse: 74  Resp: 14  Temp: (!) 97 F (36.1 C)  TempSrc: Oral  SpO2: 98%  Weight: 159 lb (72.1 kg)  Height: 5\' 4"  (1.626 m)

## 2018-03-02 NOTE — Progress Notes (Signed)
[facility]: Follow up Abdominal Aortic Aneurysm  History of Present Illness  Sheila Melton is a 77 y.o. (1941-04-17) female whom Dr. Donnetta Hutching has been monitoring for an asymptomatic infrarenal abdominal aortic aneurysm.  Previous studies demonstrate an AAA, measuring 3.67 cm.   She has had lumbar disc surgery by Dr. Trenton Gammon. She has chroniclow back pain, denies radiculopathy pain, no new back pain.  She had a bout of colitis about May 2016, she denies abdominal pain since then.  Pt states her kidneys were badly affected by IV contrast for anMRI. The patient is former a smokerbut is using vapor cigarettes with lowest dose nicotine. She reports occasional pain in her low backafter walking about 10 minutes, she does not walk much due to this pain, meloxicam helps.  She denies any history of stroke or TIA symptoms. Pt denies family history of AAA.  She states pain in bilateral hips on the lateral aspects when she walks about 100 feet.   She had a kidney bx about February 2017, diagnosed with focal segmental glomerulosclerosis, Dr. Posey Pronto is her nephrologist.    Pt Diabetic: Yes, 6.7 A1C on 11-16-17, improved from 7.2 (review of records)  Pt smoker: former smoker, quit cigarettes in 2014, using vapor cigarettes with nicotine, states low nicotine, decreased from 18 mg to 6 mg   Past Medical History:  Diagnosis Date  . AAA (abdominal aortic aneurysm) (HCC)    BEING WATCHED   [facility]. 4.2 cm infrarenal abdominal aortic aneurysm without rupture noted 11/08/2016  . Arthritis   . Cancer ([place])    skin cancer removed from left shoulder  2017  . CAP (community acquired pneumonia) 09/02/13-11/05/13    with SIRS  . Depression    ?? from prednisone    not on pred now  . Diabetes mellitus    type ii    long time ago.  . Family history of anesthesia complication    PATIENTS MOM HAD TROUBLE WAKING UP   . GERD (gastroesophageal reflux disease)    will take  occasional tums  . Heart murmur    still has.   Sees Dr. Stanford Breed  . History of kidney stones    has them at the present time  . Hyperlipidemia   . Hypertension    unspecified essential  . Renal insufficiency    "FSGS"  . Subclavian steal syndrome    LEFT SIDE   NO SIDE EFFECTS  . Subclavian steal syndrome --  left    85% blocked  . UTI (lower urinary tract infection) 08/26/13   Enterococcus and Escherichia coli both sensitive to nitrofurantoin   Past Surgical History:  Procedure Laterality Date  . ABDOMINAL HYSTERECTOMY    . BACK SURGERY    . CATARACT EXTRACTION     OS  . CYST EXCISION Right 05-23-13   Right index finger: cyst  . DILATION AND CURETTAGE OF UTERUS    . EYE SURGERY    . g1 p1    . SPINE SURGERY  Sept. 2013  . VIDEO ASSISTED THORACOSCOPY (VATS)/ LOBECTOMY Right 02/16/2017   Procedure: Right VIDEO ASSISTED THORACOSCOPY (VATS)/ Right Middle LOBECTOMY;  Surgeon: [name], MD;  Location: [facility];  Service: Thoracic;  Laterality: Right;   Social History Social History   Socioeconomic History  . Marital status: Married    Spouse name: Not on file  . Number of children: 1  . Years of education: Not on file  . Highest  education level: Not on file  Occupational History  . Not on file  Social Needs  . Financial resource strain: Not on file  . Food insecurity:    Worry: Not on file    Inability: Not on file  . Transportation needs:    Medical: Not on file    Non-medical: Not on file  Tobacco Use  . Smoking status: Light Tobacco Smoker    Packs/day: 1.00    Years: 50.00    Pack years: 50.00    Types: E-cigarettes  . Smokeless tobacco: Never Used  . Tobacco comment: e-cigs  Substance and Sexual Activity  . Alcohol use: No  . Drug use: No  . Sexual activity: Not on file  Lifestyle  . Physical activity:    Days per week: Not on file    Minutes per session: Not on file  . Stress: Not on file  Relationships  . Social connections:    Talks on  phone: Not on file    Gets together: Not on file    Attends religious service: Not on file    Active member of club or organization: Not on file    Attends meetings of clubs or organizations: Not on file    Relationship status: Not on file  . Intimate partner violence:    Fear of current or ex partner: Not on file    Emotionally abused: Not on file    Physically abused: Not on file    Forced sexual activity: Not on file  Other Topics Concern  . Not on file  Social History Narrative   Has 3 caffeinated bev a day      Exercise: none   Family History Family History  Problem Relation Age of Onset  . Heart failure Mother   . Heart disease Mother   . Hypertension Mother   . Other Mother        varicose veins  . Deep vein thrombosis Mother   . Varicose Veins Mother   . Heart attack Mother   . Hypertension Sister        X 2  . Diabetes Sister   . Hyperlipidemia Sister   . Other Sister        varicose veins  . Heart disease Father   . Diabetes Father   . Hyperlipidemia Father   . Hypertension Father   . Heart attack Father   . Heart disease Brother        MI in late 69s  . Hyperlipidemia Brother   . Heart attack Brother   . Coronary artery disease Unknown   . Diabetes Unknown   . Parkinsonism Brother   . Heart failure Brother   . Hypertension Daughter     Current Outpatient Medications on File Prior to Visit  Medication Sig Dispense Refill  . amLODipine (NORVASC) 10 MG tablet Take 1 tablet (10 mg total) by mouth daily. 30 tablet 1  . amLODipine (NORVASC) 5 MG tablet TAKE 1 TABLET BY MOUTH TWICE DAILY. 180 tablet 3  . Ascorbic Acid (VITAMIN C) 250 MG CHEW Chew 500 mg by mouth daily.    Marland Kitchen aspirin EC 81 MG tablet Take 1 tablet (81 mg total) by mouth daily. 90 tablet 3  . B Complex-C (SUPER B COMPLEX PO) Take 1 tablet by mouth daily.     . Blood Glucose Monitoring Suppl (ONE TOUCH ULTRA 2) w/Device KIT Use as advised 1 each 0  . Cholecalciferol (VITAMIN D3) 2000 UNITS  TABS  Take 2,000 Units by mouth daily.     . diazepam (VALIUM) 5 MG tablet TAKE 1 TABLET AT BEDTIME AS NEEDED. 90 tablet 0  . fexofenadine (ALLEGRA) 180 MG tablet Take 180 mg by mouth at bedtime.     Marland Kitchen FLUoxetine (PROZAC) 10 MG capsule TAKE 1 CAPSULE DAILY. 90 capsule 1  . furosemide (LASIX) 40 MG tablet TAKE 1/2 TO 1 TABLET DAILY AS NEEDED FOR FLUID 90 tablet 0  . glipiZIDE (GLUCOTROL) 5 MG tablet TAKE 1 TABLET IN THE MORNING AND 2 TABLETS IN THE EVENING. 270 tablet 0  . labetalol (NORMODYNE) 300 MG tablet TAKE 1 TABLET BY MOUTH TWICE DAILY. 180 tablet 3  . Liniments (SALONPAS PAIN RELIEF PATCH EX) Apply 1 patch topically daily as needed (back pain).    Marland Kitchen losartan (COZAAR) 100 MG tablet Take 1 tablet (100 mg total) by mouth daily. 90 tablet 3  . meloxicam (MOBIC) 7.5 MG tablet TAKE 1 TABLET ONCE DAILY. 90 tablet 0  . metFORMIN (GLUCOPHAGE) 500 MG tablet TAKE 1 TABLET BY MOUTH TWICE DAILY. 180 tablet 0  . ONE TOUCH ULTRA TEST test strip CHECK BLOOD SUGAR ONCE A DAY. 100 each 0  . rosuvastatin (CRESTOR) 20 MG tablet TAKE 1 TABLET ONCE DAILY. 90 tablet 3  . traMADol (ULTRAM) 50 MG tablet TAKE 1 TABLET DAILY AS NEEDED FOR SEVERE PAIN. 30 tablet 0  . Triamcinolone Acetonide (NASACORT ALLERGY 24HR NA) Place 1 spray into the nose 2 (two) times daily as needed (congestion).    . vitamin E 400 UNIT capsule Take 400 Units by mouth daily.     No current facility-administered medications on file prior to visit.    Allergies  Allergen Reactions  . Rocephin [Ceftriaxone Sodium In Dextrose] Dermatitis and Rash    10/24 /14 blisters of palms & diffuse rash Because of a history of documented adverse serious drug reaction;Medi Alert bracelet  is recommended  . Amoxicillin Rash    Has patient had a PCN reaction causing IMMEDIATE RASH, FACIAL/TONGUE/THROAT SWELLING, SOB, OR LIGHTHEADEDNESS WITH HYPOTENSION:  #  #  #  YES  #  #  #  Has patient had a PCN reaction causing severe rash involving mucus membranes or  skin necrosis: No Has patient had a PCN reaction that required hospitalization No Has patient had a PCN reaction occurring within the last 10 years: No If all of the above answers are "NO", then may proceed with Cephalosporin use.   . Captopril Cough  . Simvastatin Other (See Comments)    MYALGIAS BUTTOCKS CRAMP  . Ciprofloxacin Other (See Comments)    Sores in mouth   . Adhesive [Tape] Rash  . Latex Rash    Patient states she gets red and a rash when latex touches her.    ROS: See HPI for pertinent positives and negatives.  Physical Examination  Vitals:   03/02/18 0951 03/02/18 0955  BP: (!) 151/79 (!) 146/80  Pulse: 74 74  Resp: 14   Temp: (!) 97 F (36.1 C)   TempSrc: Oral   SpO2: 98%   Weight: 159 lb (72.1 kg)   Height: _0  (1.626 m)    Body mass index is 27.29 kg/m.  General: A&O x 3, WD female. HEENT: No gross abnormalities  Pulmonary: Sym exp, respirations are non labored, good air movt, CTAB, no rales, rhonchi, or wheezing. Cardiac: RRR, Nl S1, S2, no detected murmur.  Carotid Bruits Right Left   negative positive   Abdominal aortic pulse is  not palpable Radial pulses: right is 2+, left is 1+ palpable    VASCULAR EXAM:     LE Pulses Right Left   FEMORAL 1+palpable 1+ palpable    POPLITEAL not palpable  not palpable   POSTERIOR TIBIAL not palpable  faintly palpable    DORSALIS PEDIS  ANTERIOR TIBIAL Not palpable  1+palpable    Gastrointestinal: soft, NTND, -G/R, - HSM, - palpable masses, - CVAT B Musculoskeletal: M/S 4/5 throughout , Extremities without ischemic changes. Skin: No rashes, no ulcers, no cellulitis.   Neurologic: CN 2-12 intact, Pain and light touch intact in extremities are intact, Motor exam as listed above. Psychiatric: Normal  thought content, mood appropriate to clinical situation.    DATA  AAA Duplex (03/02/2018):  Previous size:  4.5 cm (Date: 08/18/17); Right CIA: 0.9 cm; Left CIA: 0.6 cm   Current size:  4.5 cm (Date: 03/02/18); Right CIA: 0.80 cm; Left CIA: 0.60 cm  CT abd/pelvis on 11-08-16 to evaluate hematuria, kidney stones: Aorta: 4.2 cm infrarenal abdominal aortic aneurysm without rupture. Soft plaque noted along the anterior and lateral walls. Aneurysm terminates at the bifurcation.  03/07/16 Carotid duplex:  1-39% bilateral ICA stenosis, essentially stable. Normal right vertebral, subclavian and brachial artery flow. 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   Normal bilateral ABI's with triphasic waveforms in 2016     Medical Decision Making  The patient is a 77 y.o. female who presents with asymptomatic AAA with no increase in size, 4.5 cm.  She was evaluated by Dr. 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 She denies any steal sx's in either hand, both radial pulses are 2+ palpable, pt inidciates this is no longer being followed; will include carotid duplex on her retrun to evaluate left subcalvian stenosis. .      Based on this patient's exam and diagnostic studies, the patient will follow up in 6 months  with the following studies: AAA duplex and cartodi duplex.   Consideration for repair of AAA would be made when the size is 5.0 cm, growth > 1 cm/yr, and symptomatic status.        The patient was given information about AAA including signs, symptoms, treatment, and how to minimize the risk of enlargement and rupture of aneurysms.    I emphasized the importance of maximal medical management including strict control of blood pressure, blood glucose, and lipid levels, antiplatelet agents, obtaining regular exercise, and cessation of smoking.   The patient was advised to call 911 should  the patient experience sudden onset abdominal or back pain.   Thank you for allowing [place] to participate in this patient's care.  Clemon Chambers, RN, MSN, FNP-C [facility] Office: [phone]  Clinic Physician: Early  03/02/2018, 10:07 AM

## 2018-03-04 DIAGNOSIS — N2581 Secondary hyperparathyroidism of renal origin: Secondary | ICD-10-CM | POA: Diagnosis not present

## 2018-03-04 DIAGNOSIS — N051 Unspecified nephritic syndrome with focal and segmental glomerular lesions: Secondary | ICD-10-CM | POA: Diagnosis not present

## 2018-03-04 DIAGNOSIS — N183 Chronic kidney disease, stage 3 (moderate): Secondary | ICD-10-CM | POA: Diagnosis not present

## 2018-03-11 DIAGNOSIS — N183 Chronic kidney disease, stage 3 (moderate): Secondary | ICD-10-CM | POA: Diagnosis not present

## 2018-03-11 DIAGNOSIS — N051 Unspecified nephritic syndrome with focal and segmental glomerular lesions: Secondary | ICD-10-CM | POA: Diagnosis not present

## 2018-03-11 DIAGNOSIS — N2581 Secondary hyperparathyroidism of renal origin: Secondary | ICD-10-CM | POA: Diagnosis not present

## 2018-03-11 DIAGNOSIS — D631 Anemia in chronic kidney disease: Secondary | ICD-10-CM | POA: Diagnosis not present

## 2018-03-22 ENCOUNTER — Other Ambulatory Visit: Payer: Self-pay

## 2018-03-22 DIAGNOSIS — I771 Stricture of artery: Secondary | ICD-10-CM

## 2018-03-22 DIAGNOSIS — I714 Abdominal aortic aneurysm, without rupture, unspecified: Secondary | ICD-10-CM

## 2018-03-31 ENCOUNTER — Other Ambulatory Visit: Payer: Self-pay | Admitting: Internal Medicine

## 2018-03-31 NOTE — Telephone Encounter (Signed)
[place] Controlled Substance Database checked. Last filled on 02/10/18

## 2018-04-01 DIAGNOSIS — D225 Melanocytic nevi of trunk: Secondary | ICD-10-CM | POA: Diagnosis not present

## 2018-04-01 DIAGNOSIS — D1801 Hemangioma of skin and subcutaneous tissue: Secondary | ICD-10-CM | POA: Diagnosis not present

## 2018-04-01 DIAGNOSIS — L570 Actinic keratosis: Secondary | ICD-10-CM | POA: Diagnosis not present

## 2018-04-01 DIAGNOSIS — L821 Other seborrheic keratosis: Secondary | ICD-10-CM | POA: Diagnosis not present

## 2018-04-01 DIAGNOSIS — L814 Other melanin hyperpigmentation: Secondary | ICD-10-CM | POA: Diagnosis not present

## 2018-04-01 DIAGNOSIS — L918 Other hypertrophic disorders of the skin: Secondary | ICD-10-CM | POA: Diagnosis not present

## 2018-04-01 DIAGNOSIS — Z85828 Personal history of other malignant neoplasm of skin: Secondary | ICD-10-CM | POA: Diagnosis not present

## 2018-04-09 ENCOUNTER — Other Ambulatory Visit: Payer: Self-pay | Admitting: Internal Medicine

## 2018-04-09 NOTE — Telephone Encounter (Signed)
Ok to refill them for 3 mo but needs an appt in the period

## 2018-04-09 NOTE — Telephone Encounter (Signed)
Please advise on below  

## 2018-04-09 NOTE — Telephone Encounter (Signed)
[place] Controlled Substance Database checked. Last filled on 01/16/18

## 2018-05-18 DIAGNOSIS — E119 Type 2 diabetes mellitus without complications: Secondary | ICD-10-CM | POA: Diagnosis not present

## 2018-05-18 NOTE — Progress Notes (Signed)
Subjective:    Patient ID: Sheila Melton, female    DOB: [birth year], 77 y.o.   MRN: [id number]  HPI The patient is here for follow up.  Diabetes: she follows with Dr Cruzita Lederer.  She is taking her medication daily as prescribed. She is compliant with a diabetic diet. She is not exercising regularly.  She checks her feet daily and denies foot lesions. She is up-to-date with an ophthalmology examination.   Hypertension: She is taking her medication daily. She is compliant with a low sodium diet.  She denies chest pain, palpitations, frequent edema, shortness of breath and regular headaches. She is not exercising regularly.  She does not monitor her blood pressure at home.    Anxiety, depression, insomnia:  She takes paxil and valium at night.  She has no side effects.  She feels her anxiety, depression and sleep are well controlled.    Chronic back pain:  She is limited by back pain.  It is arthritis.  She uses salon pass and biofreeze and it helps.  She is taking meloxicam daily, but unsure how much it helps.  She also takes tramadol as needed.  She will only take it once a day if she does take it, but does not take it on a daily basis.  Hyperlipidemia: She is taking her medication daily. She is compliant with a low fat/cholesterol diet. She is not exercising regularly. She denies myalgias.     Medications and allergies reviewed with patient and updated if appropriate.  Patient Active Problem List   Diagnosis Date Noted  . Skin yeast infection 02/26/2017  . 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 Osteoporosis 12/25/2016  . Depression 08/15/2016  . 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 CKD (chronic kidney disease) 05/21/2016  . Sleep difficulties 01/18/2016  . Diabetes mellitus ([place]) 10/23/2015  . Numbness of toes-Right foot 06/26/2015  . Colitis 03/19/2015  . Nephrolithiasis 03/19/2015  . AAA (abdominal aortic aneurysm) without rupture ([place]) 03/19/2015  . Anxiety state  03/19/2015  . Skin cancer 01/09/2015  . Former smoker 05/24/2014  . Bilateral renal cysts 09/16/2012  . 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 Degenerative spondylolisthesis 07/26/2012  . SHOULDER PAIN, LEFT, CHRONIC 07/25/2009  . CARDIAC MURMUR, SYSTOLIC 56/31/4970  . Mixed hyperlipidemia 12/30/2007  . Tobacco use disorder 12/30/2007  . Essential hypertension 12/30/2007  . Subclavian steal syndrome 12/30/2007  . CAROTID BRUIT 04/07/2007    Current Outpatient Medications on File Prior to Visit  Medication Sig Dispense Refill  . amLODipine (NORVASC) 10 MG tablet Take 1 tablet (10 mg total) by mouth daily. 30 tablet 1  . amLODipine (NORVASC) 5 MG tablet TAKE 1 TABLET BY MOUTH TWICE DAILY. 180 tablet 3  . Ascorbic Acid (VITAMIN C) 250 MG CHEW Chew 500 mg by mouth daily.    Marland Kitchen aspirin EC 81 MG tablet Take 1 tablet (81 mg total) by mouth daily. 90 tablet 3  . B Complex-C (SUPER B COMPLEX PO) Take 1 tablet by mouth daily.     . Blood Glucose Monitoring Suppl (ONE TOUCH ULTRA 2) w/Device KIT Use as advised 1 each 0  . Cholecalciferol (VITAMIN D3) 2000 UNITS TABS Take 2,000 Units by mouth daily.     . diazepam (VALIUM) 5 MG tablet TAKE 1 TABLET AT BEDTIME AS NEEDED. 90 tablet 0  . fexofenadine (ALLEGRA) 180 MG tablet Take 180 mg by mouth at bedtime.     Marland Kitchen FLUoxetine (PROZAC) 10 MG capsule TAKE  1 CAPSULE DAILY. 90 capsule 1  . furosemide (LASIX) 40 MG tablet TAKE 1/2 TO 1 TABLET DAILY AS NEEDED FOR FLUID 90 tablet 3  . glipiZIDE (GLUCOTROL) 5 MG tablet TAKE 1 TABLET IN THE MORNING AND 2 TABLETS IN THE EVENING. NEED APPOINTMENT NO MORE REFILLS 270 tablet 0  . labetalol (NORMODYNE) 300 MG tablet TAKE 1 TABLET BY MOUTH TWICE DAILY. 180 tablet 3  . Liniments (SALONPAS PAIN RELIEF PATCH EX) Apply 1 patch topically daily as needed (back pain).    Marland Kitchen losartan (COZAAR) 100 MG tablet TAKE 1 TABLET ONCE DAILY. 90 tablet 3  . meloxicam (MOBIC) 7.5 MG tablet TAKE 1 TABLET ONCE  DAILY. 90 tablet 0  . metFORMIN (GLUCOPHAGE) 500 MG tablet Take 1 tablet (500 mg total) by mouth 2 (two) times daily. NO MORE REFILLS NEED APPOINTMENT 180 tablet 0  . ONE TOUCH ULTRA TEST test strip CHECK BLOOD SUGAR ONCE A DAY. 100 each 0  . rosuvastatin (CRESTOR) 20 MG tablet TAKE 1 TABLET ONCE DAILY. 90 tablet 3  . traMADol (ULTRAM) 50 MG tablet TAKE 1 TABLET DAILY AS NEEDED FOR SEVERE PAIN. 30 tablet 0  . Triamcinolone Acetonide (NASACORT ALLERGY 24HR NA) Place 1 spray into the nose 2 (two) times daily as needed (congestion).    . vitamin E 400 UNIT capsule Take 400 Units by mouth daily.     No current facility-administered medications on file prior to visit.     Past Medical History:  Diagnosis Date  . AAA (abdominal aortic aneurysm) (HCC)    BEING WATCHED   VVS. 4.2 cm infrarenal abdominal aortic aneurysm without rupture noted 11/08/2016  . Arthritis   . Cancer ([place])    skin cancer removed from left shoulder  2017  . CAP (community acquired pneumonia) 09/02/13-11/05/13    with SIRS  . Depression    ?? from prednisone    not on pred now  . Diabetes mellitus    type ii    long time ago.  . Family history of anesthesia complication    PATIENTS MOM HAD TROUBLE WAKING UP   . GERD (gastroesophageal reflux disease)    will take occasional tums  . Heart murmur    still has.   Sees Dr. Stanford Breed  . History of kidney stones    has them at the present time  . Hyperlipidemia   . Hypertension    unspecified essential  . Renal insufficiency    "FSGS"  . Subclavian steal syndrome    LEFT SIDE   NO SIDE EFFECTS  . Subclavian steal syndrome --  left    85% blocked  . UTI (lower urinary tract infection) 08/26/13   Enterococcus and Escherichia coli both sensitive to nitrofurantoin    Past Surgical History:  Procedure Laterality Date  . ABDOMINAL HYSTERECTOMY    . BACK SURGERY    . CATARACT EXTRACTION     OS  . CYST EXCISION Right 05-23-13   Right index finger: cyst  . DILATION  AND CURETTAGE OF UTERUS    . EYE SURGERY    . g1 p1    . SPINE SURGERY  Sept. 2013  . VIDEO ASSISTED THORACOSCOPY (VATS)/ LOBECTOMY Right 02/16/2017   Procedure: Right VIDEO ASSISTED THORACOSCOPY (VATS)/ Right Middle LOBECTOMY;  Surgeon: [name], MD;  Location: [address];  Service: Thoracic;  Laterality: Right;    Social History   Socioeconomic History  . Marital status: Married    Spouse name: Not on file  .  Number of children: 1  . Years of education: Not on file  . Highest education level: Not on file  Occupational History  . Not on file  Social Needs  . Financial resource strain: Not on file  . Food insecurity:    Worry: Not on file    Inability: Not on file  . Transportation needs:    Medical: Not on file    Non-medical: Not on file  Tobacco Use  . Smoking status: Light Tobacco Smoker    Packs/day: 1.00    Years: 50.00    Pack years: 50.00    Types: E-cigarettes  . Smokeless tobacco: Never Used  . Tobacco comment: e-cigs  Substance and Sexual Activity  . Alcohol use: No  . Drug use: No  . Sexual activity: Not on file  Lifestyle  . Physical activity:    Days per week: Not on file    Minutes per session: Not on file  . Stress: Not on file  Relationships  . Social connections:    Talks on phone: Not on file    Gets together: Not on file    Attends religious service: Not on file    Active member of club or organization: Not on file    Attends meetings of clubs or organizations: Not on file    Relationship status: Not on file  Other Topics Concern  . Not on file  Social History Narrative   Has 3 caffeinated bev a day      Exercise: none    Family History  Problem Relation Age of Onset  . Heart failure Mother   . Heart disease Mother   . Hypertension Mother   . Other Mother        varicose veins  . Deep vein thrombosis Mother   . Varicose Veins Mother   . Heart attack Mother   . Hypertension Sister        X 2  . Diabetes Sister   .  Hyperlipidemia Sister   . Other Sister        varicose veins  . Heart disease Father   . Diabetes Father   . Hyperlipidemia Father   . Hypertension Father   . Heart attack Father   . Heart disease Brother        MI in late 63s  . Hyperlipidemia Brother   . Heart attack Brother   . Coronary artery disease Unknown   . Diabetes Unknown   . Parkinsonism Brother   . Heart failure Brother   . Hypertension Daughter     Review of Systems  Constitutional: Negative for chills and fever.  Respiratory: Negative for cough, shortness of breath and wheezing.   Cardiovascular: Positive for leg swelling (mild, occasional). Negative for chest pain and palpitations.  Gastrointestinal: Negative for abdominal pain and nausea.       No gerd  Musculoskeletal: Positive for back pain.  Neurological: Negative for light-headedness and headaches.       Objective:   Vitals:   05/19/18 1403  BP: (!) 122/58  Pulse: 88  Resp: 16  Temp: 98.1 F (36.7 C)  SpO2: 98%   BP Readings from Last 3 Encounters:  05/19/18 (!) 122/58  03/02/18 (!) 146/80  11/16/17 128/60   Wt Readings from Last 3 Encounters:  05/19/18 160 lb (72.6 kg)  03/02/18 159 lb (72.1 kg)  11/16/17 159 lb (72.1 kg)   Body mass index is 27.46 kg/m.   Physical Exam  Constitutional: Appears well-developed and well-nourished. No distress.  HENT:  Head: Normocephalic and atraumatic.  Neck: Neck supple. No tracheal deviation present. No thyromegaly present.  No cervical lymphadenopathy Cardiovascular: Normal rate, regular rhythm and normal heart sounds.   No murmur heard. No carotid bruit .  No edema Pulmonary/Chest: Effort normal and breath sounds normal. No respiratory distress. No has no wheezes. No rales.  Skin: Skin is warm and dry. Not diaphoretic.  Psychiatric: Normal mood and affect. Behavior is normal.      Assessment & Plan:    See Problem List for Assessment and Plan of chronic medical problems.

## 2018-05-19 ENCOUNTER — Encounter: Payer: Self-pay | Admitting: Internal Medicine

## 2018-05-19 ENCOUNTER — Ambulatory Visit (INDEPENDENT_AMBULATORY_CARE_PROVIDER_SITE_OTHER): Payer: PPO | Admitting: Internal Medicine

## 2018-05-19 ENCOUNTER — Other Ambulatory Visit (INDEPENDENT_AMBULATORY_CARE_PROVIDER_SITE_OTHER): Payer: PPO

## 2018-05-19 VITALS — BP 122/58 | HR 88 | Temp 98.1°F | Resp 16 | Wt 160.0 lb

## 2018-05-19 DIAGNOSIS — N189 Chronic kidney disease, unspecified: Secondary | ICD-10-CM

## 2018-05-19 DIAGNOSIS — F3289 Other specified depressive episodes: Secondary | ICD-10-CM | POA: Diagnosis not present

## 2018-05-19 DIAGNOSIS — G9519 Other vascular myelopathies: Secondary | ICD-10-CM | POA: Diagnosis not present

## 2018-05-19 DIAGNOSIS — E1159 Type 2 diabetes mellitus with other circulatory complications: Secondary | ICD-10-CM

## 2018-05-19 DIAGNOSIS — E782 Mixed hyperlipidemia: Secondary | ICD-10-CM | POA: Diagnosis not present

## 2018-05-19 DIAGNOSIS — G479 Sleep disorder, unspecified: Secondary | ICD-10-CM | POA: Diagnosis not present

## 2018-05-19 DIAGNOSIS — I10 Essential (primary) hypertension: Secondary | ICD-10-CM

## 2018-05-19 DIAGNOSIS — F411 Generalized anxiety disorder: Secondary | ICD-10-CM | POA: Diagnosis not present

## 2018-05-19 DIAGNOSIS — M4807 Spinal stenosis, lumbosacral region: Secondary | ICD-10-CM

## 2018-05-19 LAB — CBC WITH DIFFERENTIAL/PLATELET
BASOS ABS: 0.1 10*3/uL (ref 0.0–0.1)
Basophils Relative: 0.8 % (ref 0.0–3.0)
Eosinophils Absolute: 0.6 10*3/uL (ref 0.0–0.7)
Eosinophils Relative: 5.1 % — ABNORMAL HIGH (ref 0.0–5.0)
HCT: 35.2 % — ABNORMAL LOW (ref 36.0–46.0)
Hemoglobin: 11.8 g/dL — ABNORMAL LOW (ref 12.0–15.0)
LYMPHS ABS: 4.2 10*3/uL — AB (ref 0.7–4.0)
Lymphocytes Relative: 33.3 % (ref 12.0–46.0)
MCHC: 33.4 g/dL (ref 30.0–36.0)
MCV: 86.7 fl (ref 78.0–100.0)
MONO ABS: 1.4 10*3/uL — AB (ref 0.1–1.0)
Monocytes Relative: 10.9 % (ref 3.0–12.0)
NEUTROS ABS: 6.3 10*3/uL (ref 1.4–7.7)
NEUTROS PCT: 49.9 % (ref 43.0–77.0)
PLATELETS: 351 10*3/uL (ref 150.0–400.0)
RBC: 4.06 Mil/uL (ref 3.87–5.11)
RDW: 13.7 % (ref 11.5–15.5)
WBC: 12.6 10*3/uL — ABNORMAL HIGH (ref 4.0–10.5)

## 2018-05-19 LAB — COMPREHENSIVE METABOLIC PANEL
ALBUMIN: 4.2 g/dL (ref 3.5–5.2)
ALT: 12 U/L (ref 0–35)
AST: 15 U/L (ref 0–37)
Alkaline Phosphatase: 76 U/L (ref 39–117)
BUN: 24 mg/dL — ABNORMAL HIGH (ref 6–23)
CALCIUM: 9.8 mg/dL (ref 8.4–10.5)
CHLORIDE: 104 meq/L (ref 96–112)
CO2: 29 meq/L (ref 19–32)
CREATININE: 1.56 mg/dL — AB (ref 0.40–1.20)
GFR: 34.35 mL/min — AB (ref >60.00)
Glucose, Bld: 87 mg/dL (ref 70–99)
Potassium: 4 mEq/L (ref 3.5–5.1)
Sodium: 141 mEq/L (ref 135–145)
Total Bilirubin: 0.3 mg/dL (ref 0.2–1.2)
Total Protein: 7.1 g/dL (ref 6.0–8.3)

## 2018-05-19 LAB — HM DIABETES EYE EXAM

## 2018-05-19 LAB — LIPID PANEL
CHOL/HDL RATIO: 4
CHOLESTEROL: 173 mg/dL (ref 0–200)
HDL: 38.8 mg/dL — ABNORMAL LOW (ref >39.00)
Triglycerides: 412 mg/dL — ABNORMAL HIGH (ref 0.0–149.0)

## 2018-05-19 LAB — LDL CHOLESTEROL, DIRECT: LDL DIRECT: 63 mg/dL

## 2018-05-19 LAB — HEMOGLOBIN A1C: Hgb A1c MFr Bld: 7 % — ABNORMAL HIGH (ref 4.6–6.5)

## 2018-05-19 MED ORDER — TRAMADOL HCL 50 MG PO TABS: ORAL_TABLET | ORAL | 0 refills | Status: DC

## 2018-05-19 NOTE — Assessment & Plan Note (Signed)
Controlled, stable Continue current dose of medication  

## 2018-05-19 NOTE — Assessment & Plan Note (Signed)
BP well controlled Current regimen effective and well tolerated Continue current medications at current doses cmp  

## 2018-05-19 NOTE — Assessment & Plan Note (Addendum)
Takes tramadol only as needed It does help Taking meloxicam daily, but unsure how much it helps.  She will try going without the meloxicam and if it does not help she will discontinue it Continue topical treatments Tramadol refilled

## 2018-05-19 NOTE — Assessment & Plan Note (Signed)
Takes Valium nightly for sleep and anxiety Effective and without side effects Continue

## 2018-05-19 NOTE — Patient Instructions (Addendum)

## 2018-05-19 NOTE — Assessment & Plan Note (Addendum)
Following with nephrology Kidney disease has been stable-no protein related to FSGS Unsure if meloxicam is helping her back pain-advised trying to go without it and that is better to avoid it if possible CMP, CBC today

## 2018-05-19 NOTE — Assessment & Plan Note (Signed)
Check lipid panel  Continue daily statin Regular exercise and healthy diet encouraged  

## 2018-05-19 NOTE — Assessment & Plan Note (Addendum)
Will check a1c Sugars have been well controlled Continue current medications Unable to exercise Fairly compliant with a diabetic diet Follow-up in 6 months

## 2018-05-20 ENCOUNTER — Encounter: Payer: Self-pay | Admitting: Internal Medicine

## 2018-05-27 ENCOUNTER — Encounter: Payer: Self-pay | Admitting: Internal Medicine

## 2018-07-13 ENCOUNTER — Ambulatory Visit: Payer: PPO | Admitting: Cardiovascular Disease

## 2018-07-13 NOTE — Progress Notes (Deleted)
Cardiology Office Note   Date:  07/13/2018   ID:  Sheila Melton, DOB Jun 08, 1941, MRN [id number]  PCP:  Binnie Rail, MD  Cardiologist: Dr. Stanford Breed  No chief complaint on file.     History of Present Illness: Sheila Melton is a 77 y.o. female who is here today for a follow-up visit regarding asymptomatic  left subclavian artery stenosis. The patient has known history of abdominal aortic aneurysm followed by vascular surgery, hyperlipidemia, kidney disease currently on steroids, tobacco use and diabetes mellitus.  Echocardiogram July 2009 showed normal left ventricular function and mild mitral regurgitation.  She is known to have asymptmatic left subclavian artery stenosis . She continues to deny left arm claudication. No chest pain or shortness of breath. She is trying to quit smoking and currently using electronic cigarettes. She had resection of right lung nodule which was benign.  Past Medical History:  Diagnosis Date  . AAA (abdominal aortic aneurysm) (HCC)    BEING WATCHED   VVS. 4.2 cm infrarenal abdominal aortic aneurysm without rupture noted 11/08/2016  . Arthritis   . Cancer ([place])    skin cancer removed from left shoulder  2017  . CAP (community acquired pneumonia) 09/02/13-11/05/13    with SIRS  . Depression    ?? from prednisone    not on pred now  . Diabetes mellitus    type ii    long time ago.  . Family history of anesthesia complication    PATIENTS MOM HAD TROUBLE WAKING UP   . GERD (gastroesophageal reflux disease)    will take occasional tums  . Heart murmur    still has.   Sees Dr. Stanford Breed  . History of kidney stones    has them at the present time  . Hyperlipidemia   . Hypertension    unspecified essential  . Renal insufficiency    "FSGS"  . Subclavian steal syndrome    LEFT SIDE   NO SIDE EFFECTS  . Subclavian steal syndrome --  left    85% blocked  . UTI (lower urinary tract infection) 08/26/13   Enterococcus and Escherichia coli both  sensitive to nitrofurantoin    Past Surgical History:  Procedure Laterality Date  . ABDOMINAL HYSTERECTOMY    . BACK SURGERY    . CATARACT EXTRACTION     OS  . CYST EXCISION Right 05-23-13   Right index finger: cyst  . DILATION AND CURETTAGE OF UTERUS    . EYE SURGERY    . g1 p1    . SPINE SURGERY  Sept. 2013  . VIDEO ASSISTED THORACOSCOPY (VATS)/ LOBECTOMY Right 02/16/2017   Procedure: Right VIDEO ASSISTED THORACOSCOPY (VATS)/ Right Middle LOBECTOMY;  Surgeon: [name], MD;  Location: [facility];  Service: Thoracic;  Laterality: Right;     Current Outpatient Medications  Medication Sig Dispense Refill  . amLODipine (NORVASC) 5 MG tablet TAKE 1 TABLET BY MOUTH TWICE DAILY. 180 tablet 3  . Ascorbic Acid (VITAMIN C) 250 MG CHEW Chew 500 mg by mouth daily.    Marland Kitchen aspirin EC 81 MG tablet Take 1 tablet (81 mg total) by mouth daily. 90 tablet 3  . B Complex-C (SUPER B COMPLEX PO) Take 1 tablet by mouth daily.     . Blood Glucose Monitoring Suppl (ONE TOUCH ULTRA 2) w/Device KIT Use as advised 1 each 0  . Cholecalciferol (VITAMIN D3) 2000 UNITS TABS Take 2,000 Units by mouth daily.     . diazepam (  VALIUM) 5 MG tablet TAKE 1 TABLET AT BEDTIME AS NEEDED. 90 tablet 0  . fexofenadine (ALLEGRA) 180 MG tablet Take 180 mg by mouth at bedtime.     Marland Kitchen FLUoxetine (PROZAC) 10 MG capsule TAKE 1 CAPSULE DAILY. 90 capsule 1  . furosemide (LASIX) 40 MG tablet TAKE 1/2 TO 1 TABLET DAILY AS NEEDED FOR FLUID 90 tablet 3  . glipiZIDE (GLUCOTROL) 5 MG tablet TAKE 1 TABLET IN THE MORNING AND 2 TABLETS IN THE EVENING. NEED APPOINTMENT NO MORE REFILLS 270 tablet 0  . labetalol (NORMODYNE) 300 MG tablet TAKE 1 TABLET BY MOUTH TWICE DAILY. 180 tablet 3  . Liniments (SALONPAS PAIN RELIEF PATCH EX) Apply 1 patch topically daily as needed (back pain).    Marland Kitchen losartan (COZAAR) 100 MG tablet TAKE 1 TABLET ONCE DAILY. 90 tablet 3  . meloxicam (MOBIC) 7.5 MG tablet TAKE 1 TABLET ONCE DAILY. 90 tablet 0  . metFORMIN  (GLUCOPHAGE) 500 MG tablet Take 1 tablet (500 mg total) by mouth 2 (two) times daily. NO MORE REFILLS NEED APPOINTMENT 180 tablet 0  . ONE TOUCH ULTRA TEST test strip CHECK BLOOD SUGAR ONCE A DAY. 100 each 0  . rosuvastatin (CRESTOR) 20 MG tablet TAKE 1 TABLET ONCE DAILY. 90 tablet 3  . traMADol (ULTRAM) 50 MG tablet TAKE 1 TABLET DAILY AS NEEDED FOR SEVERE PAIN. 30 tablet 0  . Triamcinolone Acetonide (NASACORT ALLERGY 24HR NA) Place 1 spray into the nose 2 (two) times daily as needed (congestion).    . vitamin E 400 UNIT capsule Take 400 Units by mouth daily.     No current facility-administered medications for this visit.     Allergies:   Rocephin [ceftriaxone sodium in dextrose]; Amoxicillin; Captopril; Simvastatin; Ciprofloxacin; Adhesive [tape]; and Latex    Social History:  The patient  reports that she has been smoking e-cigarettes. She has a 50.00 pack-year smoking history. She has never used smokeless tobacco. She reports that she does not drink alcohol or use drugs.   Family History:  The patient's family history includes Coronary artery disease in her unknown relative; Deep vein thrombosis in her mother; Diabetes in her father, sister, and unknown relative; Heart attack in her brother, father, and mother; Heart disease in her brother, father, and mother; Heart failure in her brother and mother; Hyperlipidemia in her brother, father, and sister; Hypertension in her daughter, father, mother, and sister; Other in her mother and sister; Parkinsonism in her brother; Varicose Veins in her mother.    ROS:  Please see the history of present illness.   Otherwise, review of systems are positive for none.   All other systems are reviewed and negative.    PHYSICAL EXAM: VS:  There were no vitals taken for this visit. , BMI There is no height or weight on file to calculate BMI. GEN: Well nourished, well developed, in no acute distress  HEENT: normal  Neck: no JVD, carotid bruits, or masses.  She does have a bruit at the left upper chest area Cardiac: RRR; rubs, or gallops,no edema . There is 1/ 6 systolic ejection murmur in the aortic area Respiratory:  clear to auscultation bilaterally, normal work of breathing GI: soft, nontender, nondistended, + BS MS: no deformity or atrophy  Skin: warm and dry, no rash Neuro:  Strength and sensation are intact Psych: euthymic mood, full affect Vascular: Radial pulses normal on the right side and mildly diminished on the left side.  EKG:  EKG is not ordered today.  Recent Labs: 11/16/2017: TSH 2.61 05/19/2018: ALT 12; BUN 24; Creatinine, Ser 1.56; Hemoglobin 11.8; Platelets 351.0; Potassium 4.0; Sodium 141    Lipid Panel    Component Value Date/Time   CHOL 173 05/19/2018 1435   TRIG (H) 05/19/2018 1435    412.0 Triglyceride is over 400; calculations on Lipids are invalid.   TRIG 263 (HH) 09/15/2006 1116   HDL 38.80 (L) 05/19/2018 1435   CHOLHDL 4 05/19/2018 1435   VLDL 62.6 (H) 11/16/2017 1502   LDLCALC 123 (H) 05/24/2014 0746   LDLDIRECT 63.0 05/19/2018 1435      Wt Readings from Last 3 Encounters:  05/19/18 160 lb (72.6 kg)  03/02/18 159 lb (72.1 kg)  11/16/17 159 lb (72.1 kg)        ASSESSMENT AND PLAN:  1.  Left subclavian artery stenosis: She continues to be asymptomatic. Continue medical therapy. Avoid checking blood pressure in left arm given that the breathing is not accurate and will always underestimated Central aortic pressure. Continue low-dose aspirin.  2. Essential hypertension: Blood pressure is well controlled.  3. Hyperlipidemia: The patient reports buttocks cramps when she took simvastatin. However, the symptoms might have been related to lower back issues. She is currently on pravastatin. I recommend switching the patient to atorvastatin or rosuvastatin and trying to achieve an LDL target below 70. I will forward this to primary care physician.  4. Tobacco use: She is using electronic cigarettes  and trying to quit smoking. I discussed with her the importance of complete cessation.   Disposition:   FU with me in 1 year  Signed,  Kathlyn Sacramento, MD  07/13/2018 7:57 AM    [place]

## 2018-07-14 ENCOUNTER — Other Ambulatory Visit: Payer: Self-pay | Admitting: Internal Medicine

## 2018-07-14 NOTE — Telephone Encounter (Signed)
[place] Controlled Substance Database checked. Last filled on 04/16/18 #90   Last OV 05/19/18

## 2018-07-16 DIAGNOSIS — N2581 Secondary hyperparathyroidism of renal origin: Secondary | ICD-10-CM | POA: Diagnosis not present

## 2018-07-16 DIAGNOSIS — N189 Chronic kidney disease, unspecified: Secondary | ICD-10-CM | POA: Diagnosis not present

## 2018-07-16 DIAGNOSIS — N051 Unspecified nephritic syndrome with focal and segmental glomerular lesions: Secondary | ICD-10-CM | POA: Diagnosis not present

## 2018-07-19 IMAGING — CT NM PET TUM IMG INITIAL (PI) SKULL BASE T - THIGH
1 of 9 series · 1 of 25 positions shown · non-contrast
Comparison: MRI abdomen dated 12/03/2016. CT abdomen/pelvis dated
11/08/2016.

CLINICAL DATA: Initial treatment strategy for solitary pulmonary
nodule. Smoker.

EXAM:
NUCLEAR MEDICINE PET SKULL BASE TO THIGH
TECHNIQUE: 8.0 MCi F-18 FDG was injected intravenously. Full-ring PET imaging
was performed from the skull base to thigh after the radiotracer. CT
data was obtained and used for attenuation correction and anatomic
localization.
FASTING BLOOD GLUCOSE:  Value: 169 mg/dl

[Series 4: ct sk_thigh 5.0 b31f · axial · 5.0mm · 0.98mm/px · 1 of 209 slices shown]
[im 209/209  brain]
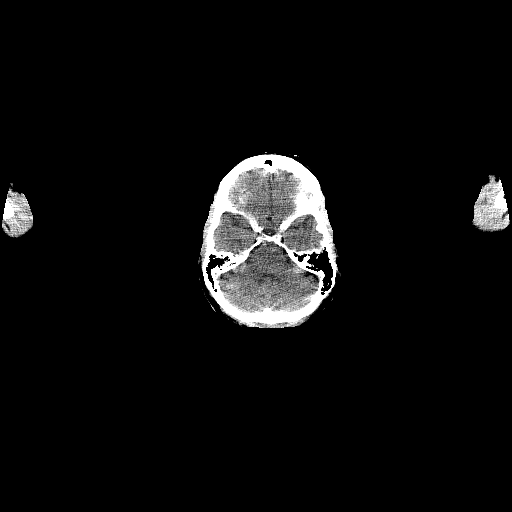

[1 of 25 positions shown; findings below may reference images not displayed]

FINDINGS: NECK

No hypermetabolic lymph nodes in the neck.

CHEST

Evaluation of the lung parenchyma is constrained by respiratory
motion.

9 mm right middle lobe nodule (series 8/ image 34) with mild
hypermetabolism, max SUV 1.5. No focal consolidation. Trace right
pleural effusion. No pneumothorax.

Heart is normal in size. Trace pericardial effusion. Three vessel
coronary atherosclerosis. No evidence of thoracic aortic aneurysm.
Atherosclerotic calcifications of the aortic arch.

No hypermetabolic thoracic lymphadenopathy.

ABDOMEN/PELVIS

No abnormal hypermetabolic activity within the liver, pancreas,
adrenal glands, or spleen.

4.4 cm infrarenal abdominal aortic aneurysm (series 4/image 127)
with eccentric mural thrombus. Atherosclerotic calcifications the
abdominal aorta and branch vessels.

6 mm nonobstructing left lower pole renal calculus (series 4/image
114). 4.4 cm medial right lower pole renal cyst (series 4/image
123). Status post hysterectomy.

No hypermetabolic lymph nodes in the abdomen or pelvis.

SKELETON

No focal hypermetabolic activity to suggest skeletal metastasis.
IMPRESSION: 9 mm right middle lobe nodule with mild hypermetabolism, worrisome
for early primary bronchogenic neoplasm.

No findings specific for metastatic disease.

Trace right pleural effusion.

4.4 cm infrarenal abdominal aortic aneurysm. Recommend followup by
US in 1 year. This recommendation follows ACR consensus guidelines:
White Paper of the ACR Incidental Findings Committee II on Vascular
Findings. [HOSPITAL] 4828; [DATE].

Additional ancillary findings as above.

## 2018-07-20 ENCOUNTER — Encounter: Payer: Self-pay | Admitting: Cardiovascular Disease

## 2018-07-20 ENCOUNTER — Ambulatory Visit: Payer: PPO | Admitting: Cardiovascular Disease

## 2018-07-20 VITALS — BP 126/64 | HR 81 | Ht 64.0 in | Wt 159.4 lb

## 2018-07-20 DIAGNOSIS — Z720 Tobacco use: Secondary | ICD-10-CM | POA: Diagnosis not present

## 2018-07-20 DIAGNOSIS — E785 Hyperlipidemia, unspecified: Secondary | ICD-10-CM

## 2018-07-20 DIAGNOSIS — I771 Stricture of artery: Secondary | ICD-10-CM | POA: Diagnosis not present

## 2018-07-20 DIAGNOSIS — I10 Essential (primary) hypertension: Secondary | ICD-10-CM | POA: Diagnosis not present

## 2018-07-20 NOTE — Patient Instructions (Signed)
Medication Instructions:  Your physician recommends that you continue on your current medications as directed. Please refer to the Current Medication list given to you today.  Labwork: None ordered.  Testing/Procedures: None ordered.  Follow-Up: Your physician recommends that you schedule a follow-up appointment as needed.   Any Other Special Instructions Will Be Listed Below (If Applicable).     If you need a refill on your cardiac medications before your next appointment, please call your pharmacy.   

## 2018-07-20 NOTE — Progress Notes (Signed)
Cardiology Office Note   Date:  07/20/2018   ID:  Sheila Melton, DOB 03-10-1941, MRN [id number]  PCP:  Binnie Rail, MD  Cardiologist: Dr. Stanford Breed  Chief Complaint  Patient presents with  . Follow-up    pt denied chest pain      History of Present Illness: Sheila Melton is a 77 y.o. female whois here today for a follow-up visit regarding asymptomatic left subclavian artery stenosis. The patient has known history of abdominal aortic aneurysm followed by vascular surgery, hyperlipidemia, kidney disease currently on steroids, tobacco use and diabetes mellitus.  Echocardiogram July 2009 showed normal left ventricular function and mild mitral regurgitation.  She was told about left subclavian artery stenosis about 7 or 8 years ago which was treated medically due to lack of symptoms. She denies any chest pain, shortness of breath or left arm claudication.  She continues to use E cigarettes but she wants to quit.  Past Medical History:  Diagnosis Date  . AAA (abdominal aortic aneurysm) (HCC)    BEING WATCHED   VVS. 4.2 cm infrarenal abdominal aortic aneurysm without rupture noted 11/08/2016  . Arthritis   . Cancer ([place])    skin cancer removed from left shoulder  2017  . CAP (community acquired pneumonia) 09/02/13-11/05/13    with SIRS  . Depression    ?? from prednisone    not on pred now  . Diabetes mellitus    type ii    long time ago.  . Family history of anesthesia complication    PATIENTS MOM HAD TROUBLE WAKING UP   . GERD (gastroesophageal reflux disease)    will take occasional tums  . Heart murmur    still has.   Sees Dr. Stanford Breed  . History of kidney stones    has them at the present time  . Hyperlipidemia   . Hypertension    unspecified essential  . Renal insufficiency    "FSGS"  . Subclavian steal syndrome    LEFT SIDE   NO SIDE EFFECTS  . Subclavian steal syndrome --  left    85% blocked  . UTI (lower urinary tract infection) 08/26/13   Enterococcus and  Escherichia coli both sensitive to nitrofurantoin    Past Surgical History:  Procedure Laterality Date  . ABDOMINAL HYSTERECTOMY    . BACK SURGERY    . CATARACT EXTRACTION     OS  . CYST EXCISION Right 05-23-13   Right index finger: cyst  . DILATION AND CURETTAGE OF UTERUS    . EYE SURGERY    . g1 p1    . SPINE SURGERY  Sept. 2013  . VIDEO ASSISTED THORACOSCOPY (VATS)/ LOBECTOMY Right 02/16/2017   Procedure: Right VIDEO ASSISTED THORACOSCOPY (VATS)/ Right Middle LOBECTOMY;  Surgeon: [name], MD;  Location: [facility];  Service: Thoracic;  Laterality: Right;     Current Outpatient Medications  Medication Sig Dispense Refill  . amLODipine (NORVASC) 5 MG tablet TAKE 1 TABLET BY MOUTH TWICE DAILY. 180 tablet 3  . Ascorbic Acid (VITAMIN C) 250 MG CHEW Chew 500 mg by mouth daily.    Marland Kitchen aspirin EC 81 MG tablet Take 1 tablet (81 mg total) by mouth daily. 90 tablet 3  . B Complex-C (SUPER B COMPLEX PO) Take 1 tablet by mouth daily.     . Blood Glucose Monitoring Suppl (ONE TOUCH ULTRA 2) w/Device KIT Use as advised 1 each 0  . Cholecalciferol (VITAMIN D3) 2000 UNITS TABS Take  2,000 Units by mouth daily.     . diazepam (VALIUM) 5 MG tablet TAKE 1 TABLET AT BEDTIME AS NEEDED. 90 tablet 0  . fexofenadine (ALLEGRA) 180 MG tablet Take 180 mg by mouth at bedtime.     Marland Kitchen FLUoxetine (PROZAC) 10 MG capsule TAKE 1 CAPSULE DAILY. 90 capsule 1  . furosemide (LASIX) 40 MG tablet TAKE 1/2 TO 1 TABLET DAILY AS NEEDED FOR FLUID 90 tablet 3  . glipiZIDE (GLUCOTROL) 5 MG tablet TAKE 1 TABLET IN THE MORNING AND 2 TABLETS IN THE EVENING. 270 tablet 0  . labetalol (NORMODYNE) 300 MG tablet TAKE 1 TABLET BY MOUTH TWICE DAILY. 180 tablet 3  . Liniments (SALONPAS PAIN RELIEF PATCH EX) Apply 1 patch topically daily as needed (back pain).    Marland Kitchen losartan (COZAAR) 100 MG tablet TAKE 1 TABLET ONCE DAILY. 90 tablet 3  . meloxicam (MOBIC) 7.5 MG tablet TAKE 1 TABLET ONCE DAILY. 90 tablet 1  . metFORMIN (GLUCOPHAGE)  500 MG tablet TAKE 1 TABLET BY MOUTH TWICE DAILY. 180 tablet 0  . ONE TOUCH ULTRA TEST test strip CHECK BLOOD SUGAR ONCE A DAY. 100 each 0  . rosuvastatin (CRESTOR) 20 MG tablet TAKE 1 TABLET ONCE DAILY. 90 tablet 3  . traMADol (ULTRAM) 50 MG tablet TAKE 1 TABLET DAILY AS NEEDED FOR SEVERE PAIN. 30 tablet 0  . Triamcinolone Acetonide (NASACORT ALLERGY 24HR NA) Place 1 spray into the nose 2 (two) times daily as needed (congestion).    . vitamin E 400 UNIT capsule Take 400 Units by mouth daily.     No current facility-administered medications for this visit.     Allergies:   Rocephin [ceftriaxone sodium in dextrose]; Amoxicillin; Captopril; Simvastatin; Ciprofloxacin; Adhesive [tape]; and Latex    Social History:  The patient  reports that she has been smoking e-cigarettes. She has a 50.00 pack-year smoking history. She has never used smokeless tobacco. She reports that she does not drink alcohol or use drugs.   Family History:  The patient's family history includes Coronary artery disease in her unknown relative; Deep vein thrombosis in her mother; Diabetes in her father, sister, and unknown relative; Heart attack in her brother, father, and mother; Heart disease in her brother, father, and mother; Heart failure in her brother and mother; Hyperlipidemia in her brother, father, and sister; Hypertension in her daughter, father, mother, and sister; Other in her mother and sister; Parkinsonism in her brother; Varicose Veins in her mother.    ROS:  Please see the history of present illness.   Otherwise, review of systems are positive for none.   All other systems are reviewed and negative.    PHYSICAL EXAM: VS:  BP 126/64   Pulse 81   Ht '5\' 4"'$  (1.626 m)   Wt 159 lb 6.4 oz (72.3 kg)   BMI 27.36 kg/m  , BMI Body mass index is 27.36 kg/m. GEN: Well nourished, well developed, in no acute distress  HEENT: normal  Neck: no JVD, carotid bruits, or masses. She does have a bruit at the left upper  chest area Cardiac: RRR; rubs, or gallops,no edema . There is 2/ 6 systolic ejection murmur in the aortic area Respiratory:  clear to auscultation bilaterally, normal work of breathing GI: soft, nontender, nondistended, + BS MS: no deformity or atrophy  Skin: warm and dry, no rash Neuro:  Strength and sensation are intact Psych: euthymic mood, full affect Vascular: Radial pulses normal on the right side and mildly diminished on  the left side.  EKG:  EKG is ordered today. Normal sinus rhythm with no significant ST or T wave changes.   Recent Labs: 11/16/2017: TSH 2.61 05/19/2018: ALT 12; BUN 24; Creatinine, Ser 1.56; Hemoglobin 11.8; Platelets 351.0; Potassium 4.0; Sodium 141    Lipid Panel    Component Value Date/Time   CHOL 173 05/19/2018 1435   TRIG (H) 05/19/2018 1435    412.0 Triglyceride is over 400; calculations on Lipids are invalid.   TRIG 263 (HH) 09/15/2006 1116   HDL 38.80 (L) 05/19/2018 1435   CHOLHDL 4 05/19/2018 1435   VLDL 62.6 (H) 11/16/2017 1502   LDLCALC 123 (H) 05/24/2014 0746   LDLDIRECT 63.0 05/19/2018 1435      Wt Readings from Last 3 Encounters:  07/20/18 159 lb 6.4 oz (72.3 kg)  05/19/18 160 lb (72.6 kg)  03/02/18 159 lb (72.1 kg)        ASSESSMENT AND PLAN:  1.  Left subclavian artery stenosis: She continues to be asymptomatic. Continue medical therapy. Avoid checking blood pressure in left arm . Continue low-dose aspirin.  2. Essential hypertension: Blood pressure is well controlled.  3. Hyperlipidemia: Continue treatment with rosuvastatin.  LDL improved to 63.  Triglyceride is still mildly elevated at 313.  I discussed the importance of healthy diet.  4. Tobacco use: She is using electronic cigarettes and trying to quit smoking. I discussed with her the importance of complete cessation.  5.  Moderate size abdominal aortic aneurysm: Followed by vascular surgery.   Disposition:   She can follow-up with me as needed if she develops left arm  or lower extremity claudication.  She can resume following up with Dr. Stanford Breed to monitor her cardiac murmur which seems to be stable.  Signed,  Kathlyn Sacramento, MD  07/20/2018 10:13 AM    [place]

## 2018-07-30 DIAGNOSIS — N183 Chronic kidney disease, stage 3 (moderate): Secondary | ICD-10-CM | POA: Diagnosis not present

## 2018-07-30 DIAGNOSIS — I129 Hypertensive chronic kidney disease with stage 1 through stage 4 chronic kidney disease, or unspecified chronic kidney disease: Secondary | ICD-10-CM | POA: Diagnosis not present

## 2018-07-30 DIAGNOSIS — D631 Anemia in chronic kidney disease: Secondary | ICD-10-CM | POA: Diagnosis not present

## 2018-07-30 DIAGNOSIS — N2581 Secondary hyperparathyroidism of renal origin: Secondary | ICD-10-CM | POA: Diagnosis not present

## 2018-08-10 ENCOUNTER — Ambulatory Visit (INDEPENDENT_AMBULATORY_CARE_PROVIDER_SITE_OTHER): Payer: PPO | Admitting: General Practice

## 2018-08-10 DIAGNOSIS — Z23 Encounter for immunization: Secondary | ICD-10-CM | POA: Diagnosis not present

## 2018-08-16 ENCOUNTER — Other Ambulatory Visit: Payer: Self-pay | Admitting: Internal Medicine

## 2018-08-16 NOTE — Telephone Encounter (Signed)
Last refill was 05/19/18 Last OV was 05/19/18 Next OV 11/17/2018

## 2018-08-19 ENCOUNTER — Other Ambulatory Visit: Payer: Self-pay

## 2018-08-19 ENCOUNTER — Encounter: Payer: Self-pay | Admitting: Family

## 2018-08-19 ENCOUNTER — Ambulatory Visit (INDEPENDENT_AMBULATORY_CARE_PROVIDER_SITE_OTHER)
Admission: RE | Admit: 2018-08-19 | Discharge: 2018-08-19 | Disposition: A | Discharge status: Home / Self Care | Payer: PPO | Source: Ambulatory Visit | Attending: Family | Admitting: Family

## 2018-08-19 ENCOUNTER — Ambulatory Visit (INDEPENDENT_AMBULATORY_CARE_PROVIDER_SITE_OTHER): Payer: PPO | Admitting: Family

## 2018-08-19 ENCOUNTER — Ambulatory Visit (HOSPITAL_COMMUNITY)
Admission: RE | Admit: 2018-08-19 | Discharge: 2018-08-19 | Disposition: A | Discharge status: Home / Self Care | Payer: PPO | Source: Ambulatory Visit | Attending: Family | Admitting: Family

## 2018-08-19 VITALS — BP 153/81 | HR 75 | Temp 97.6°F | Resp 18 | Ht 64.0 in | Wt 156.0 lb

## 2018-08-19 DIAGNOSIS — I771 Stricture of artery: Secondary | ICD-10-CM

## 2018-08-19 DIAGNOSIS — I714 Abdominal aortic aneurysm, without rupture, unspecified: Secondary | ICD-10-CM

## 2018-08-19 DIAGNOSIS — Z87891 Personal history of nicotine dependence: Secondary | ICD-10-CM

## 2018-08-19 DIAGNOSIS — Z789 Other specified health status: Secondary | ICD-10-CM | POA: Diagnosis not present

## 2018-08-19 DIAGNOSIS — I6523 Occlusion and stenosis of bilateral carotid arteries: Secondary | ICD-10-CM

## 2018-08-19 NOTE — Patient Instructions (Addendum)
Steps to Quit Smoking Smoking tobacco can be bad for your health. It can also affect almost every organ in your body. Smoking puts you and people around you at risk for many serious long-lasting (chronic) diseases. Quitting smoking is hard, but it is one of the best things that you can do for your health. It is never too late to quit. What are the benefits of quitting smoking? When you quit smoking, you lower your risk for getting serious diseases and conditions. They can include:  Lung cancer or lung disease.  Heart disease.  Stroke.  Heart attack.  Not being able to have children (infertility).  Weak bones (osteoporosis) and broken bones (fractures).  If you have coughing, wheezing, and shortness of breath, those symptoms may get better when you quit. You may also get sick less often. If you are pregnant, quitting smoking can help to lower your chances of having a baby of low birth weight. What can I do to help me quit smoking? Talk with your doctor about what can help you quit smoking. Some things you can do (strategies) include:  Quitting smoking totally, instead of slowly cutting back how much you smoke over a period of time.  Going to in-person counseling. You are more likely to quit if you go to many counseling sessions.  Using resources and support systems, such as: ? Online chats with a counselor. ? Phone quitlines. ? Printed self-help materials. ? Support groups or group counseling. ? Text messaging programs. ? Mobile phone apps or applications.  Taking medicines. Some of these medicines may have nicotine in them. If you are pregnant or breastfeeding, do not take any medicines to quit smoking unless your doctor says it is okay. Talk with your doctor about counseling or other things that can help you.  Talk with your doctor about using more than one strategy at the same time, such as taking medicines while you are also going to in-person counseling. This can help make  quitting easier. What things can I do to make it easier to quit? Quitting smoking might feel very hard at first, but there is a lot that you can do to make it easier. Take these steps:  Talk to your family and friends. Ask them to support and encourage you.  Call phone quitlines, reach out to support groups, or work with a counselor.  Ask people who smoke to not smoke around you.  Avoid places that make you want (trigger) to smoke, such as: ? Bars. ? Parties. ? Smoke-break areas at work.  Spend time with people who do not smoke.  Lower the stress in your life. Stress can make you want to smoke. Try these things to help your stress: ? Getting regular exercise. ? Deep-breathing exercises. ? Yoga. ? Meditating. ? Doing a body scan. To do this, close your eyes, focus on one area of your body at a time from head to toe, and notice which parts of your body are tense. Try to relax the muscles in those areas.  Download or buy apps on your mobile phone or tablet that can help you stick to your quit plan. There are many free apps, such as QuitGuide from the CDC (Centers for Disease Control and Prevention). You can find more support from smokefree.gov and other websites.  This information is not intended to replace advice given to you by your health care provider. Make sure you discuss any questions you have with your health care provider. Document Released: 08/23/2009 Document   Revised: 06/24/2016 Document Reviewed: 03/13/2015 Elsevier Interactive Patient Education  [employer].    Before your next abdominal ultrasound:  Avoid gas forming foods and beverages the day before the test.   Take two Extra-Strength Gas-X capsules at bedtime the night before the test. Take another two Extra-Strength Gas-X capsules in the middle of the night if you get up to the restroom, if not, first thing in the morning with water.  Do not chew gum.        Abdominal Aortic Aneurysm Blood pumps away  from the heart through tubes (blood vessels) called arteries. Aneurysms are weak or damaged places in the wall of an artery. It bulges out like a balloon. An abdominal aortic aneurysm happens in the main artery of the body (aorta). It can burst or tear, causing bleeding inside the body. This is an emergency. It needs treatment right away. What are the causes? The exact cause is unknown. Things that could cause this problem include:  Fat and other substances building up in the lining of a tube.  Swelling of the walls of a blood vessel.  Certain tissue diseases.  Belly (abdominal) trauma.  An infection in the main artery of the body.  What increases the risk? There are things that make it more likely for you to have an aneurysm. These include:  Being over the age of 75 years old.  Having high blood pressure (hypertension).  Being a female.  Being white.  Being very overweight (obese).  Having a family history of aneurysm.  Using tobacco products.  What are the signs or symptoms? Symptoms depend on the size of the aneurysm and how fast it grows. There may not be symptoms. If symptoms occur, they can include:  Pain (belly, side, lower back, or groin).  Feeling full after eating a small amount of food.  Feeling sick to your stomach (nauseous), throwing up (vomiting), or both.  Feeling a lump in your belly that feels like it is beating (pulsating).  Feeling like you will pass out (faint).  How is this treated?  Medicine to control blood pressure and pain.  Imaging tests to see if the aneurysm gets bigger.  Surgery. How is this prevented? To lessen your chance of getting this condition:  Stop smoking. Stop chewing tobacco.  Limit or avoid alcohol.  Keep your blood pressure, blood sugar, and cholesterol within normal limits.  Eat less salt.  Eat foods low in saturated fats and cholesterol. These are found in animal and whole dairy products.  Eat more fiber. Fiber  is found in whole grains, vegetables, and fruits.  Keep a healthy weight.  Stay active and exercise often.  This information is not intended to replace advice given to you by your health care provider. Make sure you discuss any questions you have with your health care provider. Document Released: 02/21/2013 Document Revised: 04/03/2016 Document Reviewed: 11/26/2012 Elsevier Interactive Patient Education  2017 [employer].     Stroke Prevention Some health problems and behaviors may make it more likely for you to have a stroke. Below are ways to lessen your risk of having a stroke.  Be active for at least 30 minutes on most or all days.  Do not smoke. Try not to be around others who smoke.  Do not drink too much alcohol. ? Do not have more than 2 drinks a day if you are a man. ? Do not have more than 1 drink a day if you are a woman  and are not pregnant.  Eat healthy foods, such as fruits and vegetables. If you were put on a specific diet, follow the diet as told.  Keep your cholesterol levels under control through diet and medicines. Look for foods that are low in saturated fat, trans fat, cholesterol, and are high in fiber.  If you have diabetes, follow all diet plans and take your medicine as told.  Ask your doctor if you need treatment to lower your blood pressure. If you have high blood pressure (hypertension), follow all diet plans and take your medicine as told by your doctor.  If you are 73-75 years old, have your blood pressure checked every 3-5 years. If you are age 66 or older, have your blood pressure checked every year.  Keep a healthy weight. Eat foods that are low in calories, salt, saturated fat, trans fat, and cholesterol.  Do not take drugs.  Avoid birth control pills, if this applies. Talk to your doctor about the risks of taking birth control pills.  Talk to your doctor if you have sleep problems (sleep apnea).  Take all medicine as told by your  doctor. ? You may be told to take aspirin or blood thinner medicine. Take this medicine as told by your doctor. ? Understand your medicine instructions.  Make sure any other conditions you have are being taken care of.  Get help right away if:  You suddenly lose feeling (you feel numb) or have weakness in your face, arm, or leg.  Your face or eyelid hangs down to one side.  You suddenly feel confused.  You have trouble talking (aphasia) or understanding what people are saying.  You suddenly have trouble seeing in one or both eyes.  You suddenly have trouble walking.  You are dizzy.  You lose your balance or your movements are clumsy (uncoordinated).  You suddenly have a very bad headache and you do not know the cause.  You have new chest pain.  Your heart feels like it is fluttering or skipping a beat (irregular heartbeat). Do not wait to see if the symptoms above go away. Get help right away. Call your local emergency services (911 in [place] Do not drive yourself to the hospital. This information is not intended to replace advice given to you by your health care provider. Make sure you discuss any questions you have with your health care provider. Document Released: 04/27/2012 Document Revised: 04/03/2016 Document Reviewed: 04/29/2013 Elsevier Interactive Patient Education  [employer].

## 2018-08-19 NOTE — Progress Notes (Signed)
[facility] [place] HISTORY AND PHYSICAL   CC: Follow up AAA and stenosis of left subclavian artery    History of Present Illness:   Sheila Melton is a 77 y.o. female whom Dr. Donnetta Hutching has been monitoring for an asymptomatic infrarenal abdominal aortic aneurysm.  Previous studies demonstrate an AAA, measuring 3.67 cm.   She also has a history of left subclavian steal. She denies pain, cold sensation, or weakness in her upper extremities.    She has had lumbar disc surgery by Dr. Trenton Gammon. She has chroniclow back pain, denies radiculopathy pain, no new back pain. Sh takes tramadol prn for this, uses Biofreeze She had a bout of colitis about May 2016, she denies abdominal pain since then.  She saw Dr. Fletcher Anon in September 2019, has seen Dr. Stanford Breed in the past, and will follow up with him, pt states Dr. Fletcher Anon advised her.   Pt states her kidneys were badly affected by IV contrast for anMRI. The patient is former a smokerbut is using vapor cigarettes withlowest dosenicotine. Shereports occasional pain in her low backafter walking about 10 minutes, she does not walk much due to this pain,meloxicam helps.  Shedeniesanyhistory of stroke or TIA symptoms. Pt denies family history of AAA.  She states pain in bilateral hips on the lateral aspects when she walks about 100 feet.   She had a kidney bx about February 2017, diagnosed with focal segmental glomerulosclerosis, Dr. Posey Pronto is her nephrologist.   Her last serum creatinine and GFR result on file was on 05-19-18: 1.56 and 34.35, stage 3 CKD.   Diabetic: Yes, 7.0  A1C on 05-19-18 Tobacco use: : former smoker, quit cigarettes in 2014, using vapor cigarettes with nicotine, states low nicotine, decreased from 18 mg to 6 mg    Pt meds include: Statin :Yes Betablocker: Yes ASA: Yes Other anticoagulants/antiplatelets: no  Current Outpatient Medications  Medication Sig Dispense Refill  . amLODipine (NORVASC)  5 MG tablet TAKE 1 TABLET BY MOUTH TWICE DAILY. 180 tablet 3  . Ascorbic Acid (VITAMIN C) 250 MG CHEW Chew 500 mg by mouth daily.    Marland Kitchen aspirin EC 81 MG tablet Take 1 tablet (81 mg total) by mouth daily. 90 tablet 3  . B Complex-C (SUPER B COMPLEX PO) Take 1 tablet by mouth daily.     . Blood Glucose Monitoring Suppl (ONE TOUCH ULTRA 2) w/Device KIT Use as advised 1 each 0  . Cholecalciferol (VITAMIN D3) 2000 UNITS TABS Take 2,000 Units by mouth daily.     . diazepam (VALIUM) 5 MG tablet TAKE 1 TABLET AT BEDTIME AS NEEDED. 90 tablet 0  . fexofenadine (ALLEGRA) 180 MG tablet Take 180 mg by mouth at bedtime.     Marland Kitchen FLUoxetine (PROZAC) 10 MG capsule TAKE 1 CAPSULE DAILY. 90 capsule 1  . furosemide (LASIX) 40 MG tablet TAKE 1/2 TO 1 TABLET DAILY AS NEEDED FOR FLUID 90 tablet 3  . glipiZIDE (GLUCOTROL) 5 MG tablet TAKE 1 TABLET IN THE MORNING AND 2 TABLETS IN THE EVENING. 270 tablet 0  . labetalol (NORMODYNE) 300 MG tablet TAKE 1 TABLET BY MOUTH TWICE DAILY. 180 tablet 3  . losartan (COZAAR) 100 MG tablet TAKE 1 TABLET ONCE DAILY. 90 tablet 3  . meloxicam (MOBIC) 7.5 MG tablet TAKE 1 TABLET ONCE DAILY. 90 tablet 1  . metFORMIN (GLUCOPHAGE) 500 MG tablet TAKE 1 TABLET BY MOUTH TWICE DAILY. 180 tablet 0  . ONE TOUCH ULTRA TEST test strip CHECK BLOOD  SUGAR ONCE A DAY. 100 each 0  . rosuvastatin (CRESTOR) 20 MG tablet TAKE 1 TABLET ONCE DAILY. 90 tablet 3  . traMADol (ULTRAM) 50 MG tablet TAKE 1 TABLET DAILY AS NEEDED FOR SEVERE PAIN. 30 tablet 0  . Triamcinolone Acetonide (NASACORT ALLERGY 24HR NA) Place 1 spray into the nose 2 (two) times daily as needed (congestion).    . vitamin E 400 UNIT capsule Take 400 Units by mouth daily.    . Liniments (SALONPAS PAIN RELIEF PATCH EX) Apply 1 patch topically daily as needed (back pain).     No current facility-administered medications for this visit.     Past Medical History:  Diagnosis Date  . AAA (abdominal aortic aneurysm) (HCC)    BEING WATCHED   VVS.  4.2 cm infrarenal abdominal aortic aneurysm without rupture noted 11/08/2016  . Arthritis   . Cancer ([place])    skin cancer removed from left shoulder  2017  . CAP (community acquired pneumonia) 09/02/13-11/05/13    with SIRS  . Depression    ?? from prednisone    not on pred now  . Diabetes mellitus    type ii    long time ago.  . Family history of anesthesia complication    PATIENTS MOM HAD TROUBLE WAKING UP   . GERD (gastroesophageal reflux disease)    will take occasional tums  . Heart murmur    still has.   Sees Dr. Stanford Breed  . History of kidney stones    has them at the present time  . Hyperlipidemia   . Hypertension    unspecified essential  . Renal insufficiency    "FSGS"  . Subclavian steal syndrome    LEFT SIDE   NO SIDE EFFECTS  . Subclavian steal syndrome --  left    85% blocked  . UTI (lower urinary tract infection) 08/26/13   Enterococcus and Escherichia coli both sensitive to nitrofurantoin    Social History Social History   Tobacco Use  . Smoking status: Light Tobacco Smoker    Packs/day: 1.00    Years: 50.00    Pack years: 50.00    Types: E-cigarettes  . Smokeless tobacco: Never Used  . Tobacco comment: e-cigs  Substance Use Topics  . Alcohol use: No  . Drug use: No    Family History Family History  Problem Relation Age of Onset  . Heart failure Mother   . Heart disease Mother   . Hypertension Mother   . Other Mother        varicose veins  . Deep vein thrombosis Mother   . Varicose Veins Mother   . Heart attack Mother   . Hypertension Sister        X 2  . Diabetes Sister   . Hyperlipidemia Sister   . Other Sister        varicose veins  . Heart disease Father   . Diabetes Father   . Hyperlipidemia Father   . Hypertension Father   . Heart attack Father   . Heart disease Brother        MI in late 77s  . Hyperlipidemia Brother   . Heart attack Brother   . Coronary artery disease Unknown   . Diabetes Unknown   . Parkinsonism Brother    . Heart failure Brother   . Hypertension Daughter     Surgical History Past Surgical History:  Procedure Laterality Date  . ABDOMINAL HYSTERECTOMY    . BACK SURGERY    .  CATARACT EXTRACTION     OS  . CYST EXCISION Right 05-23-13   Right index finger: cyst  . DILATION AND CURETTAGE OF UTERUS    . EYE SURGERY    . g1 p1    . SPINE SURGERY  Sept. 2013  . VIDEO ASSISTED THORACOSCOPY (VATS)/ LOBECTOMY Right 02/16/2017   Procedure: Right VIDEO ASSISTED THORACOSCOPY (VATS)/ Right Middle LOBECTOMY;  Surgeon: [name], MD;  Location: [facility];  Service: Thoracic;  Laterality: Right;    Allergies  Allergen Reactions  . Rocephin [Ceftriaxone Sodium In Dextrose] Dermatitis and Rash    10/24 /14 blisters of palms & diffuse rash Because of a history of documented adverse serious drug reaction;Medi Alert bracelet  is recommended  . Amoxicillin Rash    Has patient had a PCN reaction causing IMMEDIATE RASH, FACIAL/TONGUE/THROAT SWELLING, SOB, OR LIGHTHEADEDNESS WITH HYPOTENSION:  #  #  #  YES  #  #  #  Has patient had a PCN reaction causing severe rash involving mucus membranes or skin necrosis: No Has patient had a PCN reaction that required hospitalization No Has patient had a PCN reaction occurring within the last 10 years: No If all of the above answers are "NO", then may proceed with Cephalosporin use.   . Captopril Cough  . Simvastatin Other (See Comments)    MYALGIAS BUTTOCKS CRAMP  . Ciprofloxacin Other (See Comments)    Sores in mouth   . Adhesive [Tape] Rash  . Latex Rash    Patient states she gets red and a rash when latex touches her.    Current Outpatient Medications  Medication Sig Dispense Refill  . amLODipine (NORVASC) 5 MG tablet TAKE 1 TABLET BY MOUTH TWICE DAILY. 180 tablet 3  . Ascorbic Acid (VITAMIN C) 250 MG CHEW Chew 500 mg by mouth daily.    Marland Kitchen aspirin EC 81 MG tablet Take 1 tablet (81 mg total) by mouth daily. 90 tablet 3  . B Complex-C (SUPER B  COMPLEX PO) Take 1 tablet by mouth daily.     . Blood Glucose Monitoring Suppl (ONE TOUCH ULTRA 2) w/Device KIT Use as advised 1 each 0  . Cholecalciferol (VITAMIN D3) 2000 UNITS TABS Take 2,000 Units by mouth daily.     . diazepam (VALIUM) 5 MG tablet TAKE 1 TABLET AT BEDTIME AS NEEDED. 90 tablet 0  . fexofenadine (ALLEGRA) 180 MG tablet Take 180 mg by mouth at bedtime.     Marland Kitchen FLUoxetine (PROZAC) 10 MG capsule TAKE 1 CAPSULE DAILY. 90 capsule 1  . furosemide (LASIX) 40 MG tablet TAKE 1/2 TO 1 TABLET DAILY AS NEEDED FOR FLUID 90 tablet 3  . glipiZIDE (GLUCOTROL) 5 MG tablet TAKE 1 TABLET IN THE MORNING AND 2 TABLETS IN THE EVENING. 270 tablet 0  . labetalol (NORMODYNE) 300 MG tablet TAKE 1 TABLET BY MOUTH TWICE DAILY. 180 tablet 3  . losartan (COZAAR) 100 MG tablet TAKE 1 TABLET ONCE DAILY. 90 tablet 3  . meloxicam (MOBIC) 7.5 MG tablet TAKE 1 TABLET ONCE DAILY. 90 tablet 1  . metFORMIN (GLUCOPHAGE) 500 MG tablet TAKE 1 TABLET BY MOUTH TWICE DAILY. 180 tablet 0  . ONE TOUCH ULTRA TEST test strip CHECK BLOOD SUGAR ONCE A DAY. 100 each 0  . rosuvastatin (CRESTOR) 20 MG tablet TAKE 1 TABLET ONCE DAILY. 90 tablet 3  . traMADol (ULTRAM) 50 MG tablet TAKE 1 TABLET DAILY AS NEEDED FOR SEVERE PAIN. 30 tablet 0  . Triamcinolone Acetonide (NASACORT ALLERGY 24HR  NA) Place 1 spray into the nose 2 (two) times daily as needed (congestion).    . vitamin E 400 UNIT capsule Take 400 Units by mouth daily.    . Liniments (SALONPAS PAIN RELIEF PATCH EX) Apply 1 patch topically daily as needed (back pain).     No current facility-administered medications for this visit.      REVIEW OF SYSTEMS: See HPI for pertinent positives and negatives.  Physical Examination Vitals:   08/19/18 0956 08/19/18 0958  BP: (!) 143/75 (!) 153/81  Pulse: 75   Resp: 18   Temp: 97.6 F (36.4 C)   TempSrc: Oral   SpO2: 97%   Weight: 156 lb (70.8 kg)   Height: _0  (1.626 m)    Body mass index is 26.78 kg/m.  General:   A&O x 3, WD female. HEENT: No gross abnormalities  Pulmonary: Sym exp,respirations are non labored,good air movt, CTAB, no rales, rhonchi, or wheezing. Cardiac: RRR, Nl S1, S2, no detected murmur.  Carotid Bruits Right Left   negative positive   Abdominal aortic pulseis not palpable Radial pulses: right is 2+, left is 1+ palpable    VASCULAR EXAM:     LE Pulses Right Left   FEMORAL 1+palpable 1+ palpable    POPLITEAL not palpable  not palpable   POSTERIOR TIBIAL notpalpable  faintlypalpable    DORSALIS PEDIS  ANTERIOR TIBIAL faintly palpable  1+palpable    Gastrointestinal: soft, NTND, -G/R, - HSM, - palpable masses, - CVAT B Musculoskeletal: M/S 4/5 throughout , Extremities without ischemic changes. Skin: No rashes, no ulcers, no cellulitis.   Neurologic: CN 2-12 intact except has some hearing loss, Pain and light touch intact in extremities are intact, Motor exam as listed above. Psychiatric: Normal thought content, mood appropriate to clinical situation   ASSESSMENT:  MANAIA SAMAD is a 77 y.o. female who presents with asymptomatic AAA with stable size at 4.57 cm.  Left subclavian artery stenosis:She has no steal symptoms in her left arm, systolic blood pressures are <20 mm Hg difference.    DATA  AAA Duplex Previous: 4.5 cm (Date: 03/02/18); Right CIA: 0.80 cm; Left CIA: 0.60 cm Current: 4.57 cm at distal segment. (Date: 08-19-18). Limited visualization of the bilateral CIA due to overlying bowel gas.   CT abd/pelvis on 12-30-17to evaluate hematuria, kidney stones: Aorta: 4.2 cm infrarenal abdominal aortic aneurysm without rupture. Soft plaque noted along the anterior and lateral walls. Aneurysm terminates at the bifurcation.  Carotid Duplex  (08-19-18): Bilateral ICA with 1-39% stenosis Right vertebral artery flow is antegrade, left is retrograde.  Bilateral subclavian artery waveforms are stenotic.  No change compared to the exam on 03-07-16.    Normal bilateral ABI's with triphasic waveforms in 2016     Plan:  Based on this patient's exam and diagnostic studies, the patient will follow up in 6 months with the following studies: AAA duplex,  carotid duplex in a year.    I discussed in depth with the patient the nature of atherosclerosis, and emphasized the importance of maximal medical management including strict control of blood pressure, blood glucose, and lipid levels, obtaining regular exercise, and cessation of smoking.  The patient is aware that without maximal medical management the underlying atherosclerotic disease process will progress, limiting the benefit of any interventions.  Consideration for repair of AAA would be made when the size approaches 5.0 cm, growth > 1 cm/yr, and symptomatic status. The patient was given information about AAA including signs, symptoms, treatment,  what symptoms should prompt the patient to seek immediate medical care, and how to minimize the risk of enlargement and rupture of aneurysms.   The patient was given information about stroke prevention and what symptoms should prompt the patient to seek immediate medical care.  Thank you for allowing [place] to participate in this patient's care.  Sheila Chambers, RN, MSN, FNP-C [facility] Office: [phone]  Clinic MD: Oneida Alar 08/19/2018 11:00 AM

## 2018-09-01 ENCOUNTER — Other Ambulatory Visit: Payer: Self-pay | Admitting: Internal Medicine

## 2018-09-01 NOTE — Telephone Encounter (Signed)
Please advise on refill request

## 2018-09-30 ENCOUNTER — Telehealth: Payer: Self-pay | Admitting: Internal Medicine

## 2018-09-30 NOTE — Telephone Encounter (Signed)
Spoke with Mr. Circle (Syleena's husband) regarding AWV. He stated that Mykaela will give office a call or schedule her wellness visit when she comes in the office in Jan 2020 for CPE. SF

## 2018-10-04 ENCOUNTER — Other Ambulatory Visit: Payer: Self-pay | Admitting: Internal Medicine

## 2018-10-04 NOTE — Telephone Encounter (Signed)
[place] Controlled Substance Database checked. Last filled on 07/14/18  Last OV 05/19/2018 Next OV 11/17/2018

## 2018-10-05 ENCOUNTER — Other Ambulatory Visit: Payer: Self-pay | Admitting: Internal Medicine

## 2018-10-05 MED ORDER — DIAZEPAM 5 MG PO TABS: 5.0000 mg | ORAL_TABLET | Freq: Every evening | ORAL | 0 refills | Status: DC | PRN

## 2018-10-05 NOTE — Telephone Encounter (Signed)
Last refill was 07/14/18

## 2018-10-05 NOTE — Telephone Encounter (Signed)
Last refill was 08/16/18 Last OV was 05/19/18 Next OV 11/17/17

## 2018-11-17 ENCOUNTER — Other Ambulatory Visit (INDEPENDENT_AMBULATORY_CARE_PROVIDER_SITE_OTHER): Payer: PPO

## 2018-11-17 ENCOUNTER — Ambulatory Visit (INDEPENDENT_AMBULATORY_CARE_PROVIDER_SITE_OTHER): Payer: PPO | Admitting: Internal Medicine

## 2018-11-17 ENCOUNTER — Encounter: Payer: Self-pay | Admitting: Internal Medicine

## 2018-11-17 VITALS — BP 146/78 | HR 78 | Temp 98.6°F | Resp 16 | Ht 64.0 in | Wt 159.0 lb

## 2018-11-17 DIAGNOSIS — I10 Essential (primary) hypertension: Secondary | ICD-10-CM

## 2018-11-17 DIAGNOSIS — F411 Generalized anxiety disorder: Secondary | ICD-10-CM | POA: Diagnosis not present

## 2018-11-17 DIAGNOSIS — M4807 Spinal stenosis, lumbosacral region: Secondary | ICD-10-CM

## 2018-11-17 DIAGNOSIS — K5903 Drug induced constipation: Secondary | ICD-10-CM

## 2018-11-17 DIAGNOSIS — E1159 Type 2 diabetes mellitus with other circulatory complications: Secondary | ICD-10-CM

## 2018-11-17 DIAGNOSIS — E782 Mixed hyperlipidemia: Secondary | ICD-10-CM

## 2018-11-17 DIAGNOSIS — F17200 Nicotine dependence, unspecified, uncomplicated: Secondary | ICD-10-CM

## 2018-11-17 DIAGNOSIS — Z0000 Encounter for general adult medical examination without abnormal findings: Secondary | ICD-10-CM

## 2018-11-17 DIAGNOSIS — M810 Age-related osteoporosis without current pathological fracture: Secondary | ICD-10-CM | POA: Diagnosis not present

## 2018-11-17 DIAGNOSIS — F3289 Other specified depressive episodes: Secondary | ICD-10-CM

## 2018-11-17 DIAGNOSIS — G9519 Other vascular myelopathies: Secondary | ICD-10-CM

## 2018-11-17 DIAGNOSIS — K5900 Constipation, unspecified: Secondary | ICD-10-CM | POA: Insufficient documentation

## 2018-11-17 DIAGNOSIS — N189 Chronic kidney disease, unspecified: Secondary | ICD-10-CM | POA: Diagnosis not present

## 2018-11-17 DIAGNOSIS — G479 Sleep disorder, unspecified: Secondary | ICD-10-CM

## 2018-11-17 LAB — CBC WITH DIFFERENTIAL/PLATELET
BASOS ABS: 0.1 10*3/uL (ref 0.0–0.1)
Basophils Relative: 0.8 % (ref 0.0–3.0)
EOS PCT: 6.3 % — AB (ref 0.0–5.0)
Eosinophils Absolute: 0.7 10*3/uL (ref 0.0–0.7)
HEMATOCRIT: 35.4 % — AB (ref 36.0–46.0)
Hemoglobin: 11.7 g/dL — ABNORMAL LOW (ref 12.0–15.0)
LYMPHS PCT: 37.6 % (ref 12.0–46.0)
Lymphs Abs: 4.3 10*3/uL — ABNORMAL HIGH (ref 0.7–4.0)
MCHC: 33 g/dL (ref 30.0–36.0)
MCV: 86.4 fl (ref 78.0–100.0)
Monocytes Absolute: 1.3 10*3/uL — ABNORMAL HIGH (ref 0.1–1.0)
Monocytes Relative: 11.1 % (ref 3.0–12.0)
Neutro Abs: 5 10*3/uL (ref 1.4–7.7)
Neutrophils Relative %: 44.2 % (ref 43.0–77.0)
Platelets: 349 10*3/uL (ref 150.0–400.0)
RBC: 4.1 Mil/uL (ref 3.87–5.11)
RDW: 13.9 % (ref 11.5–15.5)
WBC: 11.3 10*3/uL — ABNORMAL HIGH (ref 4.0–10.5)

## 2018-11-17 LAB — COMPREHENSIVE METABOLIC PANEL
ALK PHOS: 68 U/L (ref 39–117)
ALT: 11 U/L (ref 0–35)
AST: 14 U/L (ref 0–37)
Albumin: 4.4 g/dL (ref 3.5–5.2)
BILIRUBIN TOTAL: 0.3 mg/dL (ref 0.2–1.2)
BUN: 24 mg/dL — ABNORMAL HIGH (ref 6–23)
CALCIUM: 10 mg/dL (ref 8.4–10.5)
CO2: 28 meq/L (ref 19–32)
Chloride: 103 mEq/L (ref 96–112)
Creatinine, Ser: 1.56 mg/dL — ABNORMAL HIGH (ref 0.40–1.20)
GFR: 34.31 mL/min — ABNORMAL LOW (ref >60.00)
Glucose, Bld: 82 mg/dL (ref 70–99)
POTASSIUM: 3.9 meq/L (ref 3.5–5.1)
Sodium: 140 mEq/L (ref 135–145)
TOTAL PROTEIN: 7.3 g/dL (ref 6.0–8.3)

## 2018-11-17 LAB — TSH: TSH: 2.95 u[IU]/mL (ref 0.35–4.50)

## 2018-11-17 LAB — LDL CHOLESTEROL, DIRECT: Direct LDL: 59 mg/dL

## 2018-11-17 LAB — LIPID PANEL
CHOL/HDL RATIO: 4
CHOLESTEROL: 147 mg/dL (ref 0–200)
HDL: 40 mg/dL (ref >39.00)
NonHDL: 107.03
TRIGLYCERIDES: 259 mg/dL — AB (ref 0.0–149.0)
VLDL: 51.8 mg/dL — ABNORMAL HIGH (ref 0.0–40.0)

## 2018-11-17 LAB — HEMOGLOBIN A1C: Hgb A1c MFr Bld: 6.8 % — ABNORMAL HIGH (ref 4.6–6.5)

## 2018-11-17 MED ORDER — TRAMADOL HCL 50 MG PO TABS: ORAL_TABLET | ORAL | 0 refills | Status: DC

## 2018-11-17 MED ORDER — GLUCOSE BLOOD VI STRP: ORAL_STRIP | 1 refills | Status: DC

## 2018-11-17 NOTE — Assessment & Plan Note (Signed)
Controlled, stable Continue current dose of medication  

## 2018-11-17 NOTE — Assessment & Plan Note (Signed)
Taking Valium nightly for years Effective, no side effects We will continue

## 2018-11-17 NOTE — Assessment & Plan Note (Signed)
Has been controlled No longer following with endocrine Eye exams up-to-date Check A1c Continue current medications Follow-up in 6 months

## 2018-11-17 NOTE — Assessment & Plan Note (Signed)
Controlled, stable Continue current medications

## 2018-11-17 NOTE — Progress Notes (Signed)
Subjective:    Patient ID: Sheila Melton, female    DOB: 1941/03/10, 78 y.o.   MRN: [id number]  HPI She is here for a physical exam.   She denies any changes in her history since she was here last.  She has no concerns.  She has had some increase in lower back pain for which she takes tramadol daily.  She only takes it once a day.  In the past she was not needing to take it on a daily basis, but with the holidays she was more active and has had increased pain.  As a result she has had some constipation.  She has been taking a stool softer and has taken milk of magnesia on occasion to help with constipation.  Medications and allergies reviewed with patient and updated if appropriate.  Patient Active Problem List   Diagnosis Date Noted  . Skin yeast infection 02/26/2017  . 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 Osteoporosis 12/25/2016  . Depression 08/15/2016  . 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 CKD (chronic kidney disease) 05/21/2016  . Sleep difficulties 01/18/2016  . Diabetes mellitus ([place]) 10/23/2015  . Numbness of toes-Right foot 06/26/2015  . Colitis 03/19/2015  . Nephrolithiasis 03/19/2015  . AAA (abdominal aortic aneurysm) without rupture ([place]) 03/19/2015  . Anxiety state 03/19/2015  . Skin cancer 01/09/2015  . Former smoker 05/24/2014  . Bilateral renal cysts 09/16/2012  . 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 Degenerative spondylolisthesis 07/26/2012  . SHOULDER PAIN, LEFT, CHRONIC 07/25/2009  . CARDIAC MURMUR, SYSTOLIC 82/50/5397  . Mixed hyperlipidemia 12/30/2007  . Tobacco use disorder 12/30/2007  . Essential hypertension 12/30/2007  . Subclavian steal syndrome 12/30/2007  . CAROTID BRUIT 04/07/2007    Current Outpatient Medications on File Prior to Visit  Medication Sig Dispense Refill  . amLODipine (NORVASC) 5 MG tablet TAKE 1 TABLET BY MOUTH TWICE DAILY. 180 tablet 3  . Ascorbic Acid (VITAMIN C) 250 MG  CHEW Chew 1,000 mg by mouth daily.     Marland Kitchen aspirin EC 81 MG tablet Take 1 tablet (81 mg total) by mouth daily. 90 tablet 3  . B Complex-C (SUPER B COMPLEX PO) Take 1 tablet by mouth daily.     . Blood Glucose Monitoring Suppl (ONE TOUCH ULTRA 2) w/Device KIT Use as advised 1 each 0  . Cholecalciferol (VITAMIN D3) 2000 UNITS TABS Take 2,000 Units by mouth daily.     . diazepam (VALIUM) 5 MG tablet Take 1 tablet (5 mg total) by mouth at bedtime as needed. 90 tablet 0  . fexofenadine (ALLEGRA) 180 MG tablet Take 180 mg by mouth at bedtime.     Marland Kitchen FLUoxetine (PROZAC) 10 MG capsule TAKE 1 CAPSULE DAILY. 90 capsule 1  . furosemide (LASIX) 40 MG tablet TAKE 1/2 TO 1 TABLET DAILY AS NEEDED FOR FLUID 90 tablet 3  . glipiZIDE (GLUCOTROL) 5 MG tablet TAKE 1 TABLET IN THE MORNING AND 2 TABLETS IN THE EVENING. 270 tablet 0  . labetalol (NORMODYNE) 300 MG tablet TAKE 1 TABLET BY MOUTH TWICE DAILY. 180 tablet 3  . Liniments (SALONPAS PAIN RELIEF PATCH EX) Apply 1 patch topically daily as needed (back pain).    Marland Kitchen losartan (COZAAR) 100 MG tablet TAKE 1 TABLET ONCE DAILY. 90 tablet 3  . meloxicam (MOBIC) 7.5 MG tablet TAKE 1 TABLET ONCE DAILY. 90 tablet 1  . metFORMIN (GLUCOPHAGE) 500 MG tablet TAKE 1 TABLET BY MOUTH TWICE DAILY. 180 tablet 0  .  rosuvastatin (CRESTOR) 20 MG tablet TAKE 1 TABLET ONCE DAILY. 90 tablet 3  . Triamcinolone Acetonide (NASACORT ALLERGY 24HR NA) Place 1 spray into the nose 2 (two) times daily as needed (congestion).    . vitamin E 400 UNIT capsule Take 400 Units by mouth daily.     No current facility-administered medications on file prior to visit.     Past Medical History:  Diagnosis Date  . AAA (abdominal aortic aneurysm) (HCC)    BEING WATCHED   VVS. 4.2 cm infrarenal abdominal aortic aneurysm without rupture noted 11/08/2016  . Arthritis   . Cancer ([place])    skin cancer removed from left shoulder  2017  . CAP (community acquired pneumonia) 09/02/13-11/05/13    with SIRS  .  Depression    ?? from prednisone    not on pred now  . Diabetes mellitus    type ii    long time ago.  . Family history of anesthesia complication    PATIENTS MOM HAD TROUBLE WAKING UP   . GERD (gastroesophageal reflux disease)    will take occasional tums  . Heart murmur    still has.   Sees Dr. Stanford Breed  . History of kidney stones    has them at the present time  . Hyperlipidemia   . Hypertension    unspecified essential  . Renal insufficiency    "FSGS"  . Subclavian steal syndrome    LEFT SIDE   NO SIDE EFFECTS  . Subclavian steal syndrome --  left    85% blocked  . UTI (lower urinary tract infection) 08/26/13   Enterococcus and Escherichia coli both sensitive to nitrofurantoin    Past Surgical History:  Procedure Laterality Date  . ABDOMINAL HYSTERECTOMY    . BACK SURGERY    . CATARACT EXTRACTION     OS  . CYST EXCISION Right 05-23-13   Right index finger: cyst  . DILATION AND CURETTAGE OF UTERUS    . EYE SURGERY    . g1 p1    . SPINE SURGERY  Sept. 2013  . VIDEO ASSISTED THORACOSCOPY (VATS)/ LOBECTOMY Right 02/16/2017   Procedure: Right VIDEO ASSISTED THORACOSCOPY (VATS)/ Right Middle LOBECTOMY;  Surgeon: [name], MD;  Location: [facility];  Service: Thoracic;  Laterality: Right;    Social History   Socioeconomic History  . Marital status: Married    Spouse name: Not on file  . Number of children: 1  . Years of education: Not on file  . Highest education level: Not on file  Occupational History  . Not on file  Social Needs  . Financial resource strain: Not on file  . Food insecurity:    Worry: Not on file    Inability: Not on file  . Transportation needs:    Medical: Not on file    Non-medical: Not on file  Tobacco Use  . Smoking status: Light Tobacco Smoker    Packs/day: 1.00    Years: 50.00    Pack years: 50.00    Types: E-cigarettes  . Smokeless tobacco: Never Used  . Tobacco comment: e-cigs  Substance and Sexual Activity  . Alcohol  use: No  . Drug use: No  . Sexual activity: Not on file  Lifestyle  . Physical activity:    Days per week: Not on file    Minutes per session: Not on file  . Stress: Not on file  Relationships  . Social connections:    Talks on phone: Not on  file    Gets together: Not on file    Attends religious service: Not on file    Active member of club or organization: Not on file    Attends meetings of clubs or organizations: Not on file    Relationship status: Not on file  Other Topics Concern  . Not on file  Social History Narrative   Has 3 caffeinated bev a day      Exercise: none    Family History  Problem Relation Age of Onset  . Heart failure Mother   . Heart disease Mother   . Hypertension Mother   . Other Mother        varicose veins  . Deep vein thrombosis Mother   . Varicose Veins Mother   . Heart attack Mother   . Hypertension Sister        X 2  . Diabetes Sister   . Hyperlipidemia Sister   . Other Sister        varicose veins  . Heart disease Father   . Diabetes Father   . Hyperlipidemia Father   . Hypertension Father   . Heart attack Father   . Heart disease Brother        MI in late 2s  . Hyperlipidemia Brother   . Heart attack Brother   . Coronary artery disease Unknown   . Diabetes Unknown   . Parkinsonism Brother   . Heart failure Brother   . Hypertension Daughter     Review of Systems  Constitutional: Negative for chills and fever.  Eyes: Negative for visual disturbance.  Respiratory: Negative for cough, shortness of breath and wheezing.   Cardiovascular: Negative for chest pain, palpitations and leg swelling.  Gastrointestinal: Positive for constipation. Negative for abdominal pain, blood in stool, diarrhea and nausea.       Occ gerd  Genitourinary: Negative for dysuria and hematuria.  Musculoskeletal: Positive for back pain. Negative for arthralgias.  Skin: Negative for color change and rash.  Neurological: Negative for light-headedness,  numbness and headaches.  Psychiatric/Behavioral: Negative for dysphoric mood. The patient is not nervous/anxious.        Objective:   Vitals:   11/17/18 1356  BP: (!) 146/78  Pulse: 78  Resp: 16  Temp: 98.6 F (37 C)  SpO2: 97%   Filed Weights   11/17/18 1356  Weight: 159 lb (72.1 kg)   Body mass index is 27.29 kg/m.  BP Readings from Last 3 Encounters:  11/17/18 (!) 146/78  08/19/18 (!) 153/81  07/20/18 126/64    Wt Readings from Last 3 Encounters:  11/17/18 159 lb (72.1 kg)  08/19/18 156 lb (70.8 kg)  07/20/18 159 lb 6.4 oz (72.3 kg)     Physical Exam Constitutional: She appears well-developed and well-nourished. No distress.  HENT:  Head: Normocephalic and atraumatic.  Right Ear: External ear normal. Normal ear canal and TM Left Ear: External ear normal.  Normal ear canal and TM Mouth/Throat: Oropharynx is clear and moist.  Eyes: Conjunctivae and EOM are normal.  Neck: Neck supple. No tracheal deviation present. No thyromegaly present.  No carotid bruit  Cardiovascular: Normal rate, regular rhythm and normal heart sounds.   No murmur heard.  No edema. Pulmonary/Chest: Effort normal and breath sounds normal. No respiratory distress. She has no wheezes. She has no rales.  Breast: deferred Abdominal: Soft. She exhibits no distension. There is no tenderness.  Lymphadenopathy: She has no cervical adenopathy.  Skin: Skin is warm and dry.  She is not diaphoretic.  Psychiatric: She has a normal mood and affect. Her behavior is normal.       Assessment & Plan:   Physical exam: Screening blood work  ordered Immunizations   Discussed tdap, shingrix; others up to date Colonoscopy   Up to date  Mammogram    She will think about it Dexa up-to-date-due this year Eye exams   Up to date - Dr Alois Cliche EKG   Done 2018 Exercise  Active, no regular exercise Weight   Encouraged weight loss Skin     sees derm regularly Substance abuse   Smokes e-cigs most days -  trying to taper off, but not 100% committed to quitting  See Problem List for Assessment and Plan of chronic medical problems.   FU in 6 months

## 2018-11-17 NOTE — Patient Instructions (Addendum)
For the constipation you can take docusate, senokot, dulcolax and glycerin or biscodyl suppositories.    Tests ordered today. Your results will be released to [place] (or called to you) after review, usually within 72hours after test completion. If any changes need to be made, you will be notified at that same time.  All other Health Maintenance issues reviewed.   All recommended immunizations and age-appropriate screenings are up-to-date or discussed.  No immunizations administered today.   Medications reviewed and updated.  Changes include :  none   Your prescription(s) have been submitted to your pharmacy. Please take as directed and contact our office if you believe you are having problem(s) with the medication(s).    Please followup in 6 months   Health Maintenance, Female Adopting a healthy lifestyle and getting preventive care can go a long way to promote health and wellness. Talk with your health care provider about what schedule of regular examinations is right for you. This is a good chance for you to check in with your provider about disease prevention and staying healthy. In between checkups, there are plenty of things you can do on your own. Experts have done a lot of research about which lifestyle changes and preventive measures are most likely to keep you healthy. Ask your health care provider for more information. Weight and diet Eat a healthy diet  Be sure to include plenty of vegetables, fruits, low-fat dairy products, and lean protein.  Do not eat a lot of foods high in solid fats, added sugars, or salt.  Get regular exercise. This is one of the most important things you can do for your health. ? Most adults should exercise for at least 150 minutes each week. The exercise should increase your heart rate and make you sweat (moderate-intensity exercise). ? Most adults should also do strengthening exercises at least twice a week. This is in addition to the  moderate-intensity exercise. Maintain a healthy weight  Body mass index (BMI) is a measurement that can be used to identify possible weight problems. It estimates body fat based on height and weight. Your health care provider can help determine your BMI and help you achieve or maintain a healthy weight.  For females 55 years of age and older: ? A BMI below 18.5 is considered underweight. ? A BMI of 18.5 to 24.9 is normal. ? A BMI of 25 to 29.9 is considered overweight. ? A BMI of 30 and above is considered obese. Watch levels of cholesterol and blood lipids  You should start having your blood tested for lipids and cholesterol at 76 years of age, then have this test every 5 years.  You may need to have your cholesterol levels checked more often if: ? Your lipid or cholesterol levels are high. ? You are older than 76 years of age. ? You are at high risk for heart disease. Cancer screening Lung Cancer  Lung cancer screening is recommended for adults 62-22 years old who are at high risk for lung cancer because of a history of smoking.  A yearly low-dose CT scan of the lungs is recommended for people who: ? Currently smoke. ? Have quit within the past 15 years. ? Have at least a 30-pack-year history of smoking. A pack year is smoking an average of one pack of cigarettes a day for 1 year.  Yearly screening should continue until it has been 15 years since you quit.  Yearly screening should stop if you develop a health problem that  would prevent you from having lung cancer treatment. Breast Cancer  Practice breast self-awareness. This means understanding how your breasts normally appear and feel.  It also means doing regular breast self-exams. Let your health care provider know about any changes, no matter how small.  If you are in your 20s or 30s, you should have a clinical breast exam (CBE) by a health care provider every 1-3 years as part of a regular health exam.  If you are 45 or  older, have a CBE every year. Also consider having a breast X-ray (mammogram) every year.  If you have a family history of breast cancer, talk to your health care provider about genetic screening.  If you are at high risk for breast cancer, talk to your health care provider about having an MRI and a mammogram every year.  Breast cancer gene (BRCA) assessment is recommended for women who have family members with BRCA-related cancers. BRCA-related cancers include: ? Breast. ? Ovarian. ? Tubal. ? Peritoneal cancers.  Results of the assessment will determine the need for genetic counseling and BRCA1 and BRCA2 testing. Cervical Cancer Your health care provider may recommend that you be screened regularly for cancer of the pelvic organs (ovaries, uterus, and vagina). This screening involves a pelvic examination, including checking for microscopic changes to the surface of your cervix (Pap test). You may be encouraged to have this screening done every 3 years, beginning at age 52.  For women ages 48-65, health care providers may recommend pelvic exams and Pap testing every 3 years, or they may recommend the Pap and pelvic exam, combined with testing for human papilloma virus (HPV), every 5 years. Some types of HPV increase your risk of cervical cancer. Testing for HPV may also be done on women of any age with unclear Pap test results.  Other health care providers may not recommend any screening for nonpregnant women who are considered low risk for pelvic cancer and who do not have symptoms. Ask your health care provider if a screening pelvic exam is right for you.  If you have had past treatment for cervical cancer or a condition that could lead to cancer, you need Pap tests and screening for cancer for at least 20 years after your treatment. If Pap tests have been discontinued, your risk factors (such as having a new sexual partner) need to be reassessed to determine if screening should resume. Some  women have medical problems that increase the chance of getting cervical cancer. In these cases, your health care provider may recommend more frequent screening and Pap tests. Colorectal Cancer  This type of cancer can be detected and often prevented.  Routine colorectal cancer screening usually begins at 76 years of age and continues through 76 years of age.  Your health care provider may recommend screening at an earlier age if you have risk factors for colon cancer.  Your health care provider may also recommend using home test kits to check for hidden blood in the stool.  A small camera at the end of a tube can be used to examine your colon directly (sigmoidoscopy or colonoscopy). This is done to check for the earliest forms of colorectal cancer.  Routine screening usually begins at age 59.  Direct examination of the colon should be repeated every 5-10 years through 76 years of age. However, you may need to be screened more often if early forms of precancerous polyps or small growths are found. Skin Cancer  Check your skin from head  to toe regularly.  Tell your health care provider about any new moles or changes in moles, especially if there is a change in a mole's shape or color.  Also tell your health care provider if you have a mole that is larger than the size of a pencil eraser.  Always use sunscreen. Apply sunscreen liberally and repeatedly throughout the day.  Protect yourself by wearing long sleeves, pants, a wide-brimmed hat, and sunglasses whenever you are outside. Heart disease, diabetes, and high blood pressure  High blood pressure causes heart disease and increases the risk of stroke. High blood pressure is more likely to develop in: ? People who have blood pressure in the high end of the normal range (130-139/85-89 mm Hg). ? People who are overweight or obese. ? People who are African American.  If you are 61-50 years of age, have your blood pressure checked every  3-5 years. If you are 62 years of age or older, have your blood pressure checked every year. You should have your blood pressure measured twice-once when you are at a hospital or clinic, and once when you are not at a hospital or clinic. Record the average of the two measurements. To check your blood pressure when you are not at a hospital or clinic, you can use: ? An automated blood pressure machine at a pharmacy. ? A home blood pressure monitor.  If you are between 84 years and 53 years old, ask your health care provider if you should take aspirin to prevent strokes.  Have regular diabetes screenings. This involves taking a blood sample to check your fasting blood sugar level. ? If you are at a normal weight and have a low risk for diabetes, have this test once every three years after 76 years of age. ? If you are overweight and have a high risk for diabetes, consider being tested at a younger age or more often. Preventing infection Hepatitis B  If you have a higher risk for hepatitis B, you should be screened for this virus. You are considered at high risk for hepatitis B if: ? You were born in a country where hepatitis B is common. Ask your health care provider which countries are considered high risk. ? Your parents were born in a high-risk country, and you have not been immunized against hepatitis B (hepatitis B vaccine). ? You have HIV or AIDS. ? You use needles to inject street drugs. ? You live with someone who has hepatitis B. ? You have had sex with someone who has hepatitis B. ? You get hemodialysis treatment. ? You take certain medicines for conditions, including cancer, organ transplantation, and autoimmune conditions. Hepatitis C  Blood testing is recommended for: ? Everyone born from [birth year] through 1963. ? Anyone with known risk factors for hepatitis C. Sexually transmitted infections (STIs)  You should be screened for sexually transmitted infections (STIs) including  gonorrhea and chlamydia if: ? You are sexually active and are younger than 76 years of age. ? You are older than 76 years of age and your health care provider tells you that you are at risk for this type of infection. ? Your sexual activity has changed since you were last screened and you are at an increased risk for chlamydia or gonorrhea. Ask your health care provider if you are at risk.  If you do not have HIV, but are at risk, it may be recommended that you take a prescription medicine daily to prevent HIV infection. This is  called pre-exposure prophylaxis (PrEP). You are considered at risk if: ? You are sexually active and do not regularly use condoms or know the HIV status of your partner(s). ? You take drugs by injection. ? You are sexually active with a partner who has HIV. Talk with your health care provider about whether you are at high risk of being infected with HIV. If you choose to begin PrEP, you should first be tested for HIV. You should then be tested every 3 months for as long as you are taking PrEP. Pregnancy  If you are premenopausal and you may become pregnant, ask your health care provider about preconception counseling.  If you may become pregnant, take 400 to 800 micrograms (mcg) of folic acid every day.  If you want to prevent pregnancy, talk to your health care provider about birth control (contraception). Osteoporosis and menopause  Osteoporosis is a disease in which the bones lose minerals and strength with aging. This can result in serious bone fractures. Your risk for osteoporosis can be identified using a bone density scan.  If you are 6 years of age or older, or if you are at risk for osteoporosis and fractures, ask your health care provider if you should be screened.  Ask your health care provider whether you should take a calcium or vitamin D supplement to lower your risk for osteoporosis.  Menopause may have certain physical symptoms and risks.  Hormone  replacement therapy may reduce some of these symptoms and risks. Talk to your health care provider about whether hormone replacement therapy is right for you. Follow these instructions at home:  Schedule regular health, dental, and eye exams.  Stay current with your immunizations.  Do not use any tobacco products including cigarettes, chewing tobacco, or electronic cigarettes.  If you are pregnant, do not drink alcohol.  If you are breastfeeding, limit how much and how often you drink alcohol.  Limit alcohol intake to no more than 1 drink per day for nonpregnant women. One drink equals 12 ounces of beer, 5 ounces of wine, or 1 ounces of hard liquor.  Do not use street drugs.  Do not share needles.  Ask your health care provider for help if you need support or information about quitting drugs.  Tell your health care provider if you often feel depressed.  Tell your health care provider if you have ever been abused or do not feel safe at home. This information is not intended to replace advice given to you by your health care provider. Make sure you discuss any questions you have with your health care provider. Document Released: 05/12/2011 Document Revised: 04/03/2016 Document Reviewed: 07/31/2015 Elsevier Interactive Patient Education  2019 [employer].

## 2018-11-17 NOTE — Assessment & Plan Note (Signed)
Smokes lowest dose of E cigarettes She has decreased the amount of nicotine she is smoking and is on the lowest amount At this point she has not committed to quitting

## 2018-11-17 NOTE — Assessment & Plan Note (Signed)
DEXA currently up-to-date-due this year Will discuss at her next visit Limited with what exercise she can do related to back pain Taking vitamin D-continue

## 2018-11-17 NOTE — Assessment & Plan Note (Signed)
Check lipid panel, CMP, TSH Continue daily statin Regular exercise and healthy diet encouraged

## 2018-11-17 NOTE — Assessment & Plan Note (Signed)
Blood pressure slightly elevated here today-she states that has been slightly elevated on average since she had to go off of the valsartan-currently taking losartan Advised her to ask the pharmacist if there is a safe source of the valsartan-can consider putting her back on it Continue current medications for now CMP

## 2018-11-17 NOTE — Assessment & Plan Note (Signed)
Has been experiencing constipation, which is new Related to increased back pain and needing to take the tramadol more regularly-takes 1 pill daily She has chronic kidney disease-reviewed with her what medication she can and cannot take-advised not to take any milk of magnesia

## 2018-11-17 NOTE — Assessment & Plan Note (Signed)
Following with [facility] Kidney function has been stable

## 2018-11-17 NOTE — Assessment & Plan Note (Signed)
Lower back pain Takes tramadol only as needed-only 1 pill a day as needed Has been taking it daily recently because of increased activity related to the holidays Taking meloxicam daily

## 2018-11-18 ENCOUNTER — Encounter: Payer: Self-pay | Admitting: Internal Medicine

## 2019-01-03 ENCOUNTER — Other Ambulatory Visit: Payer: Self-pay | Admitting: Internal Medicine

## 2019-01-06 DIAGNOSIS — H40013 Open angle with borderline findings, low risk, bilateral: Secondary | ICD-10-CM | POA: Diagnosis not present

## 2019-01-08 ENCOUNTER — Other Ambulatory Visit: Payer: Self-pay | Admitting: Internal Medicine

## 2019-01-08 NOTE — Telephone Encounter (Signed)
[place] Controlled Substance Database checked. Last filled on 11/17/18    Last OV 11/17/18 Next OV 05/18/19

## 2019-01-10 ENCOUNTER — Other Ambulatory Visit: Payer: Self-pay | Admitting: Internal Medicine

## 2019-01-10 NOTE — Telephone Encounter (Signed)
[place] Controlled Substance Database checked. Last filled on 10/13/18  Last OV 11/17/18 Next OV 05/18/19

## 2019-01-11 ENCOUNTER — Other Ambulatory Visit: Payer: Self-pay | Admitting: Internal Medicine

## 2019-01-19 DIAGNOSIS — M5416 Radiculopathy, lumbar region: Secondary | ICD-10-CM | POA: Diagnosis not present

## 2019-01-20 ENCOUNTER — Other Ambulatory Visit: Payer: Self-pay | Admitting: Neurosurgery

## 2019-01-20 DIAGNOSIS — M5416 Radiculopathy, lumbar region: Secondary | ICD-10-CM

## 2019-02-03 ENCOUNTER — Other Ambulatory Visit: Payer: PPO

## 2019-02-11 ENCOUNTER — Other Ambulatory Visit: Payer: Self-pay

## 2019-02-11 DIAGNOSIS — I714 Abdominal aortic aneurysm, without rupture, unspecified: Secondary | ICD-10-CM

## 2019-02-18 ENCOUNTER — Other Ambulatory Visit (HOSPITAL_COMMUNITY): Payer: PPO

## 2019-02-18 ENCOUNTER — Ambulatory Visit: Payer: PPO | Admitting: Family

## 2019-02-21 ENCOUNTER — Other Ambulatory Visit: Payer: Self-pay

## 2019-02-21 ENCOUNTER — Ambulatory Visit: Payer: PPO | Admitting: Family

## 2019-02-21 ENCOUNTER — Encounter: Payer: Self-pay | Admitting: Family

## 2019-02-21 ENCOUNTER — Ambulatory Visit (HOSPITAL_COMMUNITY)
Admission: RE | Admit: 2019-02-21 | Discharge: 2019-02-21 | Disposition: A | Discharge status: Home / Self Care | Payer: PPO | Source: Ambulatory Visit | Attending: Family | Admitting: Family

## 2019-02-21 VITALS — BP 136/76 | HR 73 | Temp 97.1°F | Resp 16 | Ht 64.0 in | Wt 158.0 lb

## 2019-02-21 DIAGNOSIS — I6523 Occlusion and stenosis of bilateral carotid arteries: Secondary | ICD-10-CM | POA: Diagnosis not present

## 2019-02-21 DIAGNOSIS — I714 Abdominal aortic aneurysm, without rupture, unspecified: Secondary | ICD-10-CM

## 2019-02-21 DIAGNOSIS — I771 Stricture of artery: Secondary | ICD-10-CM | POA: Diagnosis not present

## 2019-02-21 DIAGNOSIS — Z87891 Personal history of nicotine dependence: Secondary | ICD-10-CM | POA: Diagnosis not present

## 2019-02-21 DIAGNOSIS — Z789 Other specified health status: Secondary | ICD-10-CM

## 2019-02-21 NOTE — Patient Instructions (Addendum)
Abdominal Aortic Aneurysm    An aneurysm is a bulge in one of the blood vessels that carry blood away from the heart (artery). It happens when blood pushes up against a weak or damaged place in the wall of an artery. An abdominal aortic aneurysm happens in the main artery of the body (aorta).  Some aneurysms may not cause problems. If it grows, it can burst or tear, causing bleeding inside the body. This is an emergency. It needs to be treated right away.  What are the causes?  The exact cause of this condition is not known.  What increases the risk?  The following may make you more likely to get this condition:   Being a female who is 60 years of age or older.   Being white (Caucasian).   Using tobacco.   Having a family history of aneurysms.   Having the following conditions:  ? Hardening of the arteries (arteriosclerosis).  ? Inflammation of the walls of an artery (arteritis).  ? Certain genetic conditions.  ? Being very overweight (obesity).  ? An infection in the wall of the aorta (infectious aortitis).  ? High cholesterol.  ? High blood pressure (hypertension).  What are the signs or symptoms?  Symptoms depend on the size of the aneurysm and how fast it is growing. Most grow slowly and do not cause any symptoms. If symptoms do occur, they may include:   Pain in the belly (abdomen), side, or back.   Feeling full after eating only small amounts of food.   Feeling a throbbing lump in the belly.  Symptoms that the aneurysm has burst (ruptured) include:   Sudden, very bad pain in the belly, side, or back.   Feeling sick to your stomach (nauseous).   Throwing up (vomiting).   Feeling light-headed or passing out.  How is this treated?  Treatment for this condition depends on:   The size of the aneurysm.   How fast it is growing.   Your age.   Your risk of having it burst.  If your aneurysm is smaller than 2 inches (5 cm), your doctor may manage it by:   Checking it often to see if it is getting bigger.  You may have an imaging test (ultrasound) to check it every 3-6 months, every year, or every few years.   Giving you medicines to:  ? Control blood pressure.  ? Treat pain.  ? Fight infection.  If your aneurysm is larger than 2 inches (5 cm), you may need surgery to fix it.  Follow these instructions at home:  Lifestyle   Do not use any products that have nicotine or tobacco in them. This includes cigarettes, e-cigarettes, and chewing tobacco. If you need help quitting, ask your doctor.   Get regular exercise. Ask your doctor what types of exercise are best for you.  Eating and drinking   Eat a heart-healthy diet. This includes eating plenty of:  ? Fresh fruits and vegetables.  ? Whole grains.  ? Low-fat (lean) protein.  ? Low-fat dairy products.   Avoid foods that are high in saturated fat and cholesterol. These foods include red meat and some dairy products.   Do not drink alcohol if:  ? Your doctor tells you not to drink.  ? You are pregnant, may be pregnant, or are planning to become pregnant.   If you drink alcohol:  ? Limit how much you use to:   0-1 drink a day for women.     0-2 drinks a day for men.  ? Be aware of how much alcohol is in your drink. In the [place] one drink equals any of these:   One typical bottle of beer (12 oz).   One-half glass of wine (5 oz).   One shot of hard liquor (1 oz).  General instructions   Take over-the-counter and prescription medicines only as told by your doctor.   Keep your blood pressure within normal limits. Ask your doctor what your blood pressure should be.   Have your blood sugar (glucose) level and cholesterol levels checked regularly. Keep your blood sugar level and cholesterol levels within normal limits.   Avoid heavy lifting and activities that take a lot of effort. Ask your doctor what activities are safe for you.   Keep all follow-up visits as told by your doctor. This is important.  ? Talk to your doctor about regular screenings to see if the  aneurysm is getting bigger.  Contact a doctor if you:   Have pain in your belly, side, or back.   Have a throbbing feeling in your belly.   Have a family history of aneurysms.  Get help right away if you:   Have sudden, bad pain in your belly, side, or back.   Feel sick to your stomach.   Throw up.   Have trouble pooping (constipation).   Have trouble peeing (urinating).   Feel light-headed.   Have a fast heart rate when you stand.   Have sweaty skin that is cold to the touch (clammy).   Have shortness of breath.   Have a fever.  These symptoms may be an emergency. Do not wait to see if the symptoms will go away. Get medical help right away. Call your local emergency services (911 in the [place] Do not drive yourself to the hospital.  Summary   An aneurysm is a bulge in one of the blood vessels that carry blood away from the heart (artery). Some aneurysms may not cause problems.   You may need to have yours checked often. If it grows, it can burst or tear. This causes bleeding inside the body. It needs to be treated right away.   Follow instructions from your doctor about healthy lifestyle changes.   Keep all follow-up visits as told by your doctor. This is important.  This information is not intended to replace advice given to you by your health care provider. Make sure you discuss any questions you have with your health care provider.  Document Released: 02/21/2013 Document Revised: 06/05/2018 Document Reviewed: 06/05/2018  Elsevier Interactive Patient Education  2019 [employer]

## 2019-02-21 NOTE — Progress Notes (Signed)
[facility] [place]   CC: Follow up Abdominal Aortic Aneurysm  History of Present Illness  Sheila Melton is a 78 y.o. (1941-08-16) female whomDr. Early has been monitoring for anasymptomatic infrarenal abdominal aortic aneurysm.  Previous studies demonstrate an AAA, measuring 3.67 cm.   She also has a history of left subclavian stenosisl. She denies pain, cold sensation, or weakness in her upper extremities.    She has had lumbar disc surgery by Dr. Trenton Gammon. She has chroniclow back pain, denies radiculopathy pain, no new back pain.Is considering ESI's, states she needs an MRI of her back, with and without contrast.  She takes tramadol prn for this, uses Biofreeze She had a bout of colitis about May 2016, she denies abdominal pain since then.  She saw Dr. Fletcher Anon in September 2019, has seen Dr. Stanford Breed in the past, and will follow up with him, pt states Dr. Fletcher Anon advised her.   Pt states her kidneys were badly affected by IV contrast for anMRI. The patient is former a smokerbut is using vapor cigarettes withlowest dosenicotine. Shereports occasional pain in her low backafter walking about 10 minutes, she does not walk much due to this pain,meloxicam helps.  Shedeniesanyhistory of stroke or TIA symptoms. Pt denies family history of AAA.  She states pain in bilateral hips on the lateral aspects when she walks about 100 feet.  She had a kidney bx about February 2017, diagnosed with focal segmental glomerulosclerosis, Dr. Posey Pronto is her nephrologist.  Serum creatinine and GFR on 11-17-18 was 1.56 and 34, stage 3 CKD.    Diabetic: Yes, 6.8  A1C on 11-17-18 Tobacco use: : former smoker, quit cigarettes in 2014, using vapor cigarettes with nicotine, states low nicotine, decreased from 18 mg to 2 mg    Pt meds include: Statin :Yes Betablocker: Yes ASA: Yes Other anticoagulants/antiplatelets: no   Past Medical History:  Diagnosis Date  . AAA  (abdominal aortic aneurysm) (HCC)    BEING WATCHED   [facility]. 4.2 cm infrarenal abdominal aortic aneurysm without rupture noted 11/08/2016  . Arthritis   . Cancer ([facility])    skin cancer removed from left shoulder  2017  . CAP (community acquired pneumonia) 09/02/13-11/05/13    with SIRS  . Depression    ?? from prednisone    not on pred now  . Diabetes mellitus    type ii    long time ago.  . Family history of anesthesia complication    PATIENTS MOM HAD TROUBLE WAKING UP   . GERD (gastroesophageal reflux disease)    will take occasional tums  . Heart murmur    still has.   Sees Dr. Stanford Breed  . History of kidney stones    has them at the present time  . Hyperlipidemia   . Hypertension    unspecified essential  . Renal insufficiency    "FSGS"  . Subclavian steal syndrome    LEFT SIDE   NO SIDE EFFECTS  . Subclavian steal syndrome --  left    85% blocked  . UTI (lower urinary tract infection) 08/26/13   Enterococcus and Escherichia coli both sensitive to nitrofurantoin   Past Surgical History:  Procedure Laterality Date  . ABDOMINAL HYSTERECTOMY    . BACK SURGERY    . CATARACT EXTRACTION     OS  . CYST EXCISION Right 05-23-13   Right index finger: cyst  . DILATION AND CURETTAGE OF UTERUS    . EYE SURGERY    . g1  p1    . SPINE SURGERY  Sept. 2013  . VIDEO ASSISTED THORACOSCOPY (VATS)/ LOBECTOMY Right 02/16/2017   Procedure: Right VIDEO ASSISTED THORACOSCOPY (VATS)/ Right Middle LOBECTOMY;  Surgeon: [name], MD;  Location: [facility];  Service: Thoracic;  Laterality: Right;   Social History Social History   Socioeconomic History  . Marital status: Married    Spouse name: Not on file  . Number of children: 1  . Years of education: Not on file  . Highest education level: Not on file  Occupational History  . Not on file  Social Needs  . Financial resource strain: Not on file  . Food insecurity:    Worry: Not on file    Inability: Not on file  . Transportation  needs:    Medical: Not on file    Non-medical: Not on file  Tobacco Use  . Smoking status: Light Tobacco Smoker    Packs/day: 1.00    Years: 50.00    Pack years: 50.00    Types: E-cigarettes  . Smokeless tobacco: Never Used  . Tobacco comment: e-cigs  Substance and Sexual Activity  . Alcohol use: No  . Drug use: No  . Sexual activity: Not on file  Lifestyle  . Physical activity:    Days per week: Not on file    Minutes per session: Not on file  . Stress: Not on file  Relationships  . Social connections:    Talks on phone: Not on file    Gets together: Not on file    Attends religious service: Not on file    Active member of club or organization: Not on file    Attends meetings of clubs or organizations: Not on file    Relationship status: Not on file  . Intimate partner violence:    Fear of current or ex partner: Not on file    Emotionally abused: Not on file    Physically abused: Not on file    Forced sexual activity: Not on file  Other Topics Concern  . Not on file  Social History Narrative   Has 3 caffeinated bev a day      Exercise: none   Family History Family History  Problem Relation Age of Onset  . Heart failure Mother   . Heart disease Mother   . Hypertension Mother   . Other Mother        varicose veins  . Deep vein thrombosis Mother   . Varicose Veins Mother   . Heart attack Mother   . Hypertension Sister        X 2  . Diabetes Sister   . Hyperlipidemia Sister   . Other Sister        varicose veins  . Heart disease Father   . Diabetes Father   . Hyperlipidemia Father   . Hypertension Father   . Heart attack Father   . Heart disease Brother        MI in late 44s  . Hyperlipidemia Brother   . Heart attack Brother   . Coronary artery disease Unknown   . Diabetes Unknown   . Parkinsonism Brother   . Heart failure Brother   . Hypertension Daughter     Current Outpatient Medications on File Prior to Visit  Medication Sig Dispense Refill   . amLODipine (NORVASC) 5 MG tablet TAKE 1 TABLET BY MOUTH TWICE DAILY. 180 tablet 0  . Ascorbic Acid (VITAMIN C) 250 MG CHEW Chew 1,000 mg  by mouth daily.     Marland Kitchen aspirin EC 81 MG tablet Take 1 tablet (81 mg total) by mouth daily. 90 tablet 3  . B Complex-C (SUPER B COMPLEX PO) Take 1 tablet by mouth daily.     . Blood Glucose Monitoring Suppl (ONE TOUCH ULTRA 2) w/Device KIT Use as advised 1 each 0  . Cholecalciferol (VITAMIN D3) 2000 UNITS TABS Take 2,000 Units by mouth daily.     . diazepam (VALIUM) 5 MG tablet TAKE 1 TABLET AT BEDTIME AS NEEDED. 90 tablet 0  . fexofenadine (ALLEGRA) 180 MG tablet Take 180 mg by mouth at bedtime.     Marland Kitchen FLUoxetine (PROZAC) 10 MG capsule TAKE 1 CAPSULE DAILY. 90 capsule 0  . furosemide (LASIX) 40 MG tablet TAKE 1/2 TO 1 TABLET DAILY AS NEEDED FOR FLUID 90 tablet 3  . glipiZIDE (GLUCOTROL) 5 MG tablet TAKE 1 TABLET IN THE MORNING AND 2 TABLETS IN THE EVENING. 270 tablet 0  . glucose blood (ONE TOUCH ULTRA TEST) test strip CHECK BLOOD SUGAR TWICE A DAY. 180 each 1  . labetalol (NORMODYNE) 300 MG tablet TAKE 1 TABLET BY MOUTH TWICE DAILY. 180 tablet 1  . Liniments (SALONPAS PAIN RELIEF PATCH EX) Apply 1 patch topically daily as needed (back pain).    Marland Kitchen losartan (COZAAR) 100 MG tablet TAKE 1 TABLET ONCE DAILY. 90 tablet 3  . meloxicam (MOBIC) 7.5 MG tablet TAKE 1 TABLET ONCE DAILY. 90 tablet 1  . metFORMIN (GLUCOPHAGE) 500 MG tablet TAKE 1 TABLET BY MOUTH TWICE DAILY. 180 tablet 1  . rosuvastatin (CRESTOR) 20 MG tablet TAKE 1 TABLET ONCE DAILY. 90 tablet 1  . traMADol (ULTRAM) 50 MG tablet TAKE 1 TABLET DAILY AS NEEDED FOR SEVERE PAIN. 30 tablet 0  . Triamcinolone Acetonide (NASACORT ALLERGY 24HR NA) Place 1 spray into the nose 2 (two) times daily as needed (congestion).    . vitamin E 400 UNIT capsule Take 400 Units by mouth daily.     No current facility-administered medications on file prior to visit.    Allergies  Allergen Reactions  . Rocephin  [Ceftriaxone Sodium In Dextrose] Dermatitis and Rash    10/24 /14 blisters of palms & diffuse rash Because of a history of documented adverse serious drug reaction;Medi Alert bracelet  is recommended  . Amoxicillin Rash    Has patient had a PCN reaction causing IMMEDIATE RASH, FACIAL/TONGUE/THROAT SWELLING, SOB, OR LIGHTHEADEDNESS WITH HYPOTENSION:  #  #  #  YES  #  #  #  Has patient had a PCN reaction causing severe rash involving mucus membranes or skin necrosis: No Has patient had a PCN reaction that required hospitalization No Has patient had a PCN reaction occurring within the last 10 years: No If all of the above answers are "NO", then may proceed with Cephalosporin use.   . Captopril Cough  . Simvastatin Other (See Comments)    MYALGIAS BUTTOCKS CRAMP  . Ciprofloxacin Other (See Comments)    Sores in mouth   . Adhesive [Tape] Rash  . Latex Rash    Patient states she gets red and a rash when latex touches her.    ROS: See HPI for pertinent positives and negatives.  Physical Examination  Vitals:   02/21/19 1002  BP: 136/76  Pulse: 73  Resp: 16  Temp: (!) 97.1 F (36.2 C)  TempSrc: Oral  SpO2: 98%  Weight: 158 lb (71.7 kg)  Height: '5\' 4"'$  (1.626 m)   Body mass index  is 27.12 kg/m.  General: A&O x 3, WD, female. HEENT: Grossly intact and WNL.  Pulmonary: Sym exp, respirations are non labored, good air movement in all fileds, CTAB, no rales, rhonchi, or wheezing. Cardiac: Regular rhythm and rate, no detected murmur.  Carotid Bruits Right Left   Negative Negative   Adominal aortic pulse is modetaely palpable Radial pulses: right is 2+, left is 1+ palpable                          VASCULAR EXAM:                                                                                                         LE Pulses Right Left       FEMORAL  2+ palpable  1+ palpable        POPLITEAL  not palpable   not palpable       POSTERIOR TIBIAL  faintly palpable   faintly  palpable        DORSALIS PEDIS      ANTERIOR TIBIAL faintly palpable  faintly palpable     Gastrointestinal: soft, NTND, -G/R, - HSM, - masses palpated, - CVAT B. Central adiposity.  Musculoskeletal: M/S 5/5 throughout, Extremities without ischemic changes. Skin: No rashes, no ulcers, no cellulitis.   Neurologic: CN 2-12 intact, Pain and light touch intact in extremities are intact except, Motor exam as listed above. Psychiatric: Normal thought content, mood appropriate to clinical situation.    DATA  AAA Duplex (02/21/2019):  Previous size: 4.57 cm (Date: 08-19-18). Limited visualization of the bilateral CIA due to overlying bowel gas.    Current size: (Date: 02-21-19) Abdominal Aorta Findings: +-----------+-------+----------+----------+--------+--------+--------+ Location   AP (cm)Trans (cm)PSV (cm/s)WaveformThrombusComments +-----------+-------+----------+----------+--------+--------+--------+ Proximal   2.49   2.51      96                                 +-----------+-------+----------+----------+--------+--------+--------+ Mid        4.64   4.96      130                                +-----------+-------+----------+----------+--------+--------+--------+ Distal     3.43   3.37      227                                +-----------+-------+----------+----------+--------+--------+--------+ RT CIA Prox1.3    1.4       126                                +-----------+-------+----------+----------+--------+--------+--------+ LT CIA Prox1.1    1.1       132                                +-----------+-------+----------+----------+--------+--------+--------+  Summary: Abdominal Aorta: There is evidence of abnormal dilitation of the Mid Abdominal aorta. The largest aortic measurement is 5.0 cm. The largest aortic  diameter has increased compared to prior exam. Previous diameter measurement was 4.6 cm obtained on 08/19/2018.   CT abd/pelvis on 12-30-17to evaluate hematuria, kidney stones: Aorta: 4.2 cm infrarenal abdominal aortic aneurysm without rupture. Soft plaque noted along the anterior and lateral walls. Aneurysm terminates at the bifurcation.  Carotid Duplex (08-19-18): Bilateral ICA with 1-39% stenosis Right vertebral artery flow is antegrade, left is retrograde.  Bilateral subclavian artery waveforms are stenotic.  No change compared to the exam on 03-07-16.    Normal bilateral ABI's with triphasic waveforms in 2016    Medical Decision Making  The patient is a 78 y.o. female who presents with asymptomatic AAA with increase in size from 4.6 cm in October 2019 to 5 cm today.  She has stage 3 CKD, last serum creatinine result on file was 1.56 in January 2020.  Will forward my note from today to Dr. Trenton Gammon, and pt states he is considering an MRI of her back, with and w/o contrast, and pt states she will call Dr. Irven [place] office re her stage 3 CKD.   I discussed with Dr. Trula Slade pt asymptomatic 5 cm AAA, and her serum creatinine of 1.56 in January this year.  Will schedule non contrast CT abd/pelvis in about 3 months, follow up with Dr. Donnetta Hutching afterward.    Left subclavian artery stenosis:She has no steal symptoms in her left arm. Carotid duplex in a year.    Consideration for repair of AAA would be made when the size is 5.5cm, growth > 1 cm/yr, and symptomatic status.  Abdominal aortic aneurysm less than 5-1/2 cm in diameter has less then 1/2% risk of rupture per year.        The patient was given information about AAA including signs, symptoms, treatment, and how to minimize the risk of enlargement and rupture of aneurysms.    I emphasized the importance of maximal medical management including strict control of blood pressure, blood glucose, and lipid levels, antiplatelet agents,  obtaining regular exercise, and  cessation of smoking.   The patient was advised to call 911 should the patient experience sudden onset abdominal or back pain.   Thank you for allowing [place] to participate in this patient's care.  Clemon Chambers, RN, MSN, FNP-C [facility] Office: [facility] Physician: Trula Slade  02/21/2019, 10:44 AM

## 2019-03-17 ENCOUNTER — Other Ambulatory Visit: Payer: Self-pay | Admitting: Internal Medicine

## 2019-03-17 NOTE — Telephone Encounter (Signed)
[place] Controlled Database Checked Last filled: 01/08/19 # 30 LOV w/you: 11/17/18 Next appt w/you: 05/28/19

## 2019-03-21 DIAGNOSIS — L814 Other melanin hyperpigmentation: Secondary | ICD-10-CM | POA: Diagnosis not present

## 2019-03-21 DIAGNOSIS — D225 Melanocytic nevi of trunk: Secondary | ICD-10-CM | POA: Diagnosis not present

## 2019-03-21 DIAGNOSIS — Z85828 Personal history of other malignant neoplasm of skin: Secondary | ICD-10-CM | POA: Diagnosis not present

## 2019-03-21 DIAGNOSIS — L821 Other seborrheic keratosis: Secondary | ICD-10-CM | POA: Diagnosis not present

## 2019-03-21 DIAGNOSIS — L304 Erythema intertrigo: Secondary | ICD-10-CM | POA: Diagnosis not present

## 2019-03-31 ENCOUNTER — Ambulatory Visit
Admission: RE | Admit: 2019-03-31 | Discharge: 2019-03-31 | Disposition: A | Discharge status: Home / Self Care | Payer: PPO | Source: Ambulatory Visit | Attending: Neurosurgery | Admitting: Neurosurgery

## 2019-03-31 ENCOUNTER — Other Ambulatory Visit: Payer: Self-pay | Admitting: Neurosurgery

## 2019-03-31 ENCOUNTER — Other Ambulatory Visit: Payer: Self-pay

## 2019-03-31 DIAGNOSIS — M5416 Radiculopathy, lumbar region: Secondary | ICD-10-CM

## 2019-03-31 DIAGNOSIS — M48061 Spinal stenosis, lumbar region without neurogenic claudication: Secondary | ICD-10-CM | POA: Diagnosis not present

## 2019-04-07 ENCOUNTER — Other Ambulatory Visit: Payer: Self-pay | Admitting: Internal Medicine

## 2019-04-07 NOTE — Telephone Encounter (Signed)
Last refill was 01/11/19 Last OV 11/17/18 Next OV 05/18/19

## 2019-04-08 ENCOUNTER — Other Ambulatory Visit: Payer: Self-pay | Admitting: Internal Medicine

## 2019-04-11 ENCOUNTER — Telehealth: Payer: Self-pay | Admitting: Internal Medicine

## 2019-04-11 MED ORDER — GLIPIZIDE 5 MG PO TABS: ORAL_TABLET | ORAL | 0 refills | Status: DC

## 2019-04-11 NOTE — Telephone Encounter (Signed)
[facility] called to request the following RX be sent to them;  MEDICATION: glipiZIDE (GLUCOTROL) 5 MG tablet  PHARMACY:   [facility], [place] [phone] (Phone) [phone] (Fax)     IS THIS A 90 DAY SUPPLY : Yes  IS PATIENT OUT OF MEDICATION: Yes  IF NOT; HOW MUCH IS LEFT: None  LAST APPOINTMENT DATE: @5 /29/2020  NEXT APPOINTMENT DATE:@Visit  date not found  DO WE HAVE YOUR PERMISSION TO LEAVE A DETAILED MESSAGE: N/A  OTHER COMMENTS:    **Let patient know to contact pharmacy at the end of the day to make sure medication is ready. **  ** Please notify patient to allow 48-72 hours to process**  **Encourage patient to contact the pharmacy for refills or they can request refills through [facility]**

## 2019-04-11 NOTE — Telephone Encounter (Signed)
Copied from [place] [id number]. Topic: Quick Communication - Rx Refill/Question >> Apr 11, 2019 10:17 AM Mcneil, Ja-Kwan wrote: Medication: glipiZIDE (GLUCOTROL) 5 MG tablet  Has the patient contacted their pharmacy? yes   Preferred Pharmacy (with phone number or street name): [facility], [place]. [phone] (Phone) [phone] (Fax)  Agent: Please be advised that RX refills may take up to 3 business days. We ask that you follow-up with your pharmacy.

## 2019-04-11 NOTE — Telephone Encounter (Signed)
PATIENT NOT SEEN SINCE 2018.  Patient needs to contact PCP and pharmacy has been notified of this.

## 2019-04-11 NOTE — Telephone Encounter (Signed)
Rx sent 

## 2019-04-14 DIAGNOSIS — Z6826 Body mass index (BMI) 26.0-26.9, adult: Secondary | ICD-10-CM | POA: Diagnosis not present

## 2019-04-14 DIAGNOSIS — M5416 Radiculopathy, lumbar region: Secondary | ICD-10-CM | POA: Diagnosis not present

## 2019-04-20 DIAGNOSIS — N183 Chronic kidney disease, stage 3 (moderate): Secondary | ICD-10-CM | POA: Diagnosis not present

## 2019-04-21 ENCOUNTER — Other Ambulatory Visit: Payer: Self-pay | Admitting: Vascular Surgery

## 2019-04-21 DIAGNOSIS — I771 Stricture of artery: Secondary | ICD-10-CM

## 2019-04-28 DIAGNOSIS — N183 Chronic kidney disease, stage 3 (moderate): Secondary | ICD-10-CM | POA: Diagnosis not present

## 2019-04-28 DIAGNOSIS — R809 Proteinuria, unspecified: Secondary | ICD-10-CM | POA: Diagnosis not present

## 2019-04-28 DIAGNOSIS — I129 Hypertensive chronic kidney disease with stage 1 through stage 4 chronic kidney disease, or unspecified chronic kidney disease: Secondary | ICD-10-CM | POA: Diagnosis not present

## 2019-04-28 DIAGNOSIS — N2581 Secondary hyperparathyroidism of renal origin: Secondary | ICD-10-CM | POA: Diagnosis not present

## 2019-04-28 DIAGNOSIS — D631 Anemia in chronic kidney disease: Secondary | ICD-10-CM | POA: Diagnosis not present

## 2019-05-05 ENCOUNTER — Other Ambulatory Visit: Payer: Self-pay | Admitting: Internal Medicine

## 2019-05-05 NOTE — Telephone Encounter (Signed)
Last OV 11/17/18 Last RF 03/17/19 Next OV 05/18/19

## 2019-05-17 NOTE — Progress Notes (Signed)
Subjective:    Patient ID: Sheila Melton, female    DOB: 11/10/1941, 78 y.o.   MRN: [id number]  HPI The patient is here for follow up.  She is not exercising regularly.     Diabetes: She is taking her medication daily as prescribed. She is compliant with a diabetic diet.  She monitors her sugars and they have been running 125-175. She denies numbness/tingling in her feet and foot lesions. She is up-to-date with an ophthalmology examination.   CKD, Hypertension: she sees Dr Posey Pronto for her kidneys.  She is taking her medication daily. She is compliant with a low sodium diet.  She denies chest pain, palpitations, edema, shortness of breath and regular headaches. She does not monitor her blood pressure at home.    Hyperlipidemia: She is taking her medication daily. She is compliant with a low fat/cholesterol diet. She denies myalgias.   Anxiety, Depression: She is taking her medication daily as prescribed. She denies any side effects from the medication. She feels her depression and anxiety are well controlled and she is happy with her current dose of medication.    Chronic back pain:  She follows with Dr Lovenia Shuck.  She is using biofreeze and salon pas.  She takes meloxicam daily.  She takes tramadol prn.  She will be having an epidural.  She is concerned about it raising her sugar.      Medications and allergies reviewed with patient and updated if appropriate.  Patient Active Problem List   Diagnosis Date Noted  . Constipation 11/17/2018  . Skin yeast infection 02/26/2017  . 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 Osteoporosis 12/25/2016  . Depression 08/15/2016  . 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 CKD (chronic kidney disease) 05/21/2016  . Sleep difficulties 01/18/2016  . Diabetes mellitus ([place]) 10/23/2015  . Numbness of toes-Right foot 06/26/2015  . Colitis 03/19/2015  . Nephrolithiasis 03/19/2015  . AAA (abdominal aortic aneurysm) without rupture ([place])  03/19/2015  . Anxiety state 03/19/2015  . Skin cancer 01/09/2015  . Former smoker 05/24/2014  . Bilateral renal cysts 09/16/2012  . 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 Degenerative spondylolisthesis 07/26/2012  . SHOULDER PAIN, LEFT, CHRONIC 07/25/2009  . CARDIAC MURMUR, SYSTOLIC 75/17/0017  . Mixed hyperlipidemia 12/30/2007  . Tobacco use disorder 12/30/2007  . Essential hypertension 12/30/2007  . Subclavian steal syndrome 12/30/2007  . CAROTID BRUIT 04/07/2007    Current Outpatient Medications on File Prior to Visit  Medication Sig Dispense Refill  . amLODipine (NORVASC) 5 MG tablet TAKE 1 TABLET BY MOUTH TWICE DAILY. 180 tablet 0  . Ascorbic Acid (VITAMIN C) 250 MG CHEW Chew 1,000 mg by mouth daily.     Marland Kitchen aspirin EC 81 MG tablet Take 1 tablet (81 mg total) by mouth daily. 90 tablet 3  . B Complex-C (SUPER B COMPLEX PO) Take 1 tablet by mouth daily.     . Blood Glucose Monitoring Suppl (ONE TOUCH ULTRA 2) w/Device KIT Use as advised 1 each 0  . Cholecalciferol (VITAMIN D3) 2000 UNITS TABS Take 2,000 Units by mouth daily.     . diazepam (VALIUM) 5 MG tablet TAKE 1 TABLET AT BEDTIME AS NEEDED. 90 tablet 0  . fexofenadine (ALLEGRA) 180 MG tablet Take 180 mg by mouth at bedtime.     Marland Kitchen FLUoxetine (PROZAC) 10 MG capsule TAKE 1 CAPSULE DAILY. 90 capsule 0  . furosemide (LASIX) 40 MG tablet TAKE 1/2 TO 1 TABLET DAILY AS NEEDED  FOR FLUID 90 tablet 0  . glipiZIDE (GLUCOTROL) 5 MG tablet Take 1 tablet in the morning and 2 tablets in the evening. 270 tablet 0  . glucose blood (ONE TOUCH ULTRA TEST) test strip CHECK BLOOD SUGAR TWICE A DAY. 180 each 1  . Liniments (SALONPAS PAIN RELIEF PATCH EX) Apply 1 patch topically daily as needed (back pain).    Marland Kitchen losartan (COZAAR) 100 MG tablet TAKE 1 TABLET ONCE DAILY. 90 tablet 0  . meloxicam (MOBIC) 7.5 MG tablet TAKE 1 TABLET ONCE DAILY. 90 tablet 1  . metFORMIN (GLUCOPHAGE) 500 MG tablet TAKE 1 TABLET BY MOUTH TWICE  DAILY. 180 tablet 1  . rosuvastatin (CRESTOR) 20 MG tablet TAKE 1 TABLET ONCE DAILY. 90 tablet 1  . traMADol (ULTRAM) 50 MG tablet TAKE 1 TABLET DAILY AS NEEDED FOR SEVERE PAIN. 30 tablet 0  . Triamcinolone Acetonide (NASACORT ALLERGY 24HR NA) Place 1 spray into the nose 2 (two) times daily as needed (congestion).    . vitamin E 400 UNIT capsule Take 400 Units by mouth daily.     No current facility-administered medications on file prior to visit.     Past Medical History:  Diagnosis Date  . AAA (abdominal aortic aneurysm) (HCC)    BEING WATCHED   VVS. 4.2 cm infrarenal abdominal aortic aneurysm without rupture noted 11/08/2016  . Arthritis   . Cancer ([place])    skin cancer removed from left shoulder  2017  . CAP (community acquired pneumonia) 09/02/13-11/05/13    with SIRS  . Depression    ?? from prednisone    not on pred now  . Diabetes mellitus    type ii    long time ago.  . Family history of anesthesia complication    PATIENTS MOM HAD TROUBLE WAKING UP   . GERD (gastroesophageal reflux disease)    will take occasional tums  . Heart murmur    still has.   Sees Dr. Stanford Breed  . History of kidney stones    has them at the present time  . Hyperlipidemia   . Hypertension    unspecified essential  . Renal insufficiency    "FSGS"  . Subclavian steal syndrome    LEFT SIDE   NO SIDE EFFECTS  . Subclavian steal syndrome --  left    85% blocked  . UTI (lower urinary tract infection) 08/26/13   Enterococcus and Escherichia coli both sensitive to nitrofurantoin    Past Surgical History:  Procedure Laterality Date  . ABDOMINAL HYSTERECTOMY    . BACK SURGERY    . CATARACT EXTRACTION     OS  . CYST EXCISION Right 05-23-13   Right index finger: cyst  . DILATION AND CURETTAGE OF UTERUS    . EYE SURGERY    . g1 p1    . SPINE SURGERY  Sept. 2013  . VIDEO ASSISTED THORACOSCOPY (VATS)/ LOBECTOMY Right 02/16/2017   Procedure: Right VIDEO ASSISTED THORACOSCOPY (VATS)/ Right Middle  LOBECTOMY;  Surgeon: [name], MD;  Location: [facility];  Service: Thoracic;  Laterality: Right;    Social History   Socioeconomic History  . Marital status: Married    Spouse name: Not on file  . Number of children: 1  . Years of education: Not on file  . Highest education level: Not on file  Occupational History  . Not on file  Social Needs  . Financial resource strain: Not on file  . Food insecurity    Worry: Not on file  Inability: Not on file  . Transportation needs    Medical: Not on file    Non-medical: Not on file  Tobacco Use  . Smoking status: Light Tobacco Smoker    Packs/day: 1.00    Years: 50.00    Pack years: 50.00    Types: E-cigarettes  . Smokeless tobacco: Never Used  . Tobacco comment: e-cigs  Substance and Sexual Activity  . Alcohol use: No  . Drug use: No  . Sexual activity: Not on file  Lifestyle  . Physical activity    Days per week: Not on file    Minutes per session: Not on file  . Stress: Not on file  Relationships  . Social Herbalist on phone: Not on file    Gets together: Not on file    Attends religious service: Not on file    Active member of club or organization: Not on file    Attends meetings of clubs or organizations: Not on file    Relationship status: Not on file  Other Topics Concern  . Not on file  Social History Narrative   Has 3 caffeinated bev a day      Exercise: none    Family History  Problem Relation Age of Onset  . Heart failure Mother   . Heart disease Mother   . Hypertension Mother   . Other Mother        varicose veins  . Deep vein thrombosis Mother   . Varicose Veins Mother   . Heart attack Mother   . Hypertension Sister        X 2  . Diabetes Sister   . Hyperlipidemia Sister   . Other Sister        varicose veins  . Heart disease Father   . Diabetes Father   . Hyperlipidemia Father   . Hypertension Father   . Heart attack Father   . Heart disease Brother        MI in late  39s  . Hyperlipidemia Brother   . Heart attack Brother   . Coronary artery disease Unknown   . Diabetes Unknown   . Parkinsonism Brother   . Heart failure Brother   . Hypertension Daughter     Review of Systems  Constitutional: Negative for chills and fever.  Respiratory: Negative for cough, shortness of breath and wheezing.   Cardiovascular: Negative for chest pain, palpitations and leg swelling.  Neurological: Positive for numbness (occ in hands only). Negative for light-headedness and headaches.       Objective:   Vitals:   05/18/19 1356  BP: (!) 122/58  Pulse: 82  Resp: 16  Temp: 99 F (37.2 C)  SpO2: 97%   BP Readings from Last 3 Encounters:  05/18/19 (!) 122/58  02/21/19 136/76  11/17/18 (!) 146/78   Wt Readings from Last 3 Encounters:  05/18/19 161 lb (73 kg)  02/21/19 158 lb (71.7 kg)  11/17/18 159 lb (72.1 kg)   Body mass index is 27.64 kg/m.   Physical Exam    Constitutional: Appears well-developed and well-nourished. No distress.  HENT:  Head: Normocephalic and atraumatic.  Neck: Neck supple. No tracheal deviation present. No thyromegaly present.  No cervical lymphadenopathy Cardiovascular: Normal rate, regular rhythm and normal heart sounds.   No murmur heard. No carotid bruit .  No edema Pulmonary/Chest: Effort normal and breath sounds normal. No respiratory distress. No has no wheezes. No rales.  Skin: Skin is warm and  dry. Not diaphoretic.  Psychiatric: Normal mood and affect. Behavior is normal.    Diabetic Foot Exam - Simple   Simple Foot Form Diabetic Foot exam was performed with the following findings: Yes 05/18/2019  2:53 PM  Visual Inspection No deformities, no ulcerations, no other skin breakdown bilaterally: Yes Sensation Testing Intact to touch and monofilament testing bilaterally: Yes Pulse Check Posterior Tibialis and Dorsalis pulse intact bilaterally: Yes Comments      Assessment & Plan:    See Problem List for Assessment  and Plan of chronic medical problems.

## 2019-05-18 ENCOUNTER — Ambulatory Visit (INDEPENDENT_AMBULATORY_CARE_PROVIDER_SITE_OTHER): Payer: PPO | Admitting: Internal Medicine

## 2019-05-18 ENCOUNTER — Encounter: Payer: Self-pay | Admitting: Internal Medicine

## 2019-05-18 ENCOUNTER — Other Ambulatory Visit: Payer: Self-pay

## 2019-05-18 VITALS — BP 122/58 | HR 82 | Temp 99.0°F | Resp 16 | Ht 64.0 in | Wt 161.0 lb

## 2019-05-18 DIAGNOSIS — E1159 Type 2 diabetes mellitus with other circulatory complications: Secondary | ICD-10-CM

## 2019-05-18 DIAGNOSIS — N183 Chronic kidney disease, stage 3 unspecified: Secondary | ICD-10-CM

## 2019-05-18 DIAGNOSIS — F3289 Other specified depressive episodes: Secondary | ICD-10-CM

## 2019-05-18 DIAGNOSIS — E782 Mixed hyperlipidemia: Secondary | ICD-10-CM

## 2019-05-18 DIAGNOSIS — I10 Essential (primary) hypertension: Secondary | ICD-10-CM

## 2019-05-18 DIAGNOSIS — F411 Generalized anxiety disorder: Secondary | ICD-10-CM

## 2019-05-18 LAB — POCT GLYCOSYLATED HEMOGLOBIN (HGB A1C): Hemoglobin A1C: 6.5 % — AB (ref 4.0–5.6)

## 2019-05-18 MED ORDER — LANTUS SOLOSTAR 100 UNIT/ML ~~LOC~~ SOPN: 10.0000 [IU] | PEN_INJECTOR | Freq: Every day | SUBCUTANEOUS | 0 refills | Status: DC

## 2019-05-18 MED ORDER — LABETALOL HCL 300 MG PO TABS: 300.0000 mg | ORAL_TABLET | Freq: Three times a day (TID) | ORAL | Status: DC

## 2019-05-18 NOTE — Assessment & Plan Note (Addendum)
Labetalol increased by Dr Posey Pronto BP well controlled Current regimen effective and well tolerated Continue current medications at current doses

## 2019-05-18 NOTE — Assessment & Plan Note (Signed)
Controlled, stable Continue current dose of medication  

## 2019-05-18 NOTE — Assessment & Plan Note (Signed)
Continue statin. 

## 2019-05-18 NOTE — Patient Instructions (Addendum)
  Your a1c was checked today.    Medications reviewed and updated.  Changes include :   Insulin after the epidural.   If your sugar is > 300 - take 10 units.  Call us the next day so we can adjust your dose.   Your prescription(s) have been submitted to your pharmacy. Please take as directed and contact our office if you believe you are having problem(s) with the medication(s).   Please followup in 6 months

## 2019-05-18 NOTE — Assessment & Plan Note (Addendum)
Just had labs with Dr patel

## 2019-05-18 NOTE — Assessment & Plan Note (Addendum)
Check a1c Low sugar / carb diet Stressed regular exercise Concern over sugars increasing after epidural -- will prescribe lantus for that time

## 2019-05-23 ENCOUNTER — Ambulatory Visit
Admission: RE | Admit: 2019-05-23 | Discharge: 2019-05-23 | Disposition: A | Discharge status: Home / Self Care | Payer: PPO | Source: Ambulatory Visit | Attending: Vascular Surgery | Admitting: Vascular Surgery

## 2019-05-23 ENCOUNTER — Other Ambulatory Visit: Payer: Self-pay | Admitting: Vascular Surgery

## 2019-05-23 ENCOUNTER — Other Ambulatory Visit: Payer: Self-pay

## 2019-05-23 DIAGNOSIS — I771 Stricture of artery: Secondary | ICD-10-CM

## 2019-05-23 DIAGNOSIS — I708 Atherosclerosis of other arteries: Secondary | ICD-10-CM | POA: Diagnosis not present

## 2019-05-23 DIAGNOSIS — I714 Abdominal aortic aneurysm, without rupture: Secondary | ICD-10-CM | POA: Diagnosis not present

## 2019-05-24 DIAGNOSIS — M47817 Spondylosis without myelopathy or radiculopathy, lumbosacral region: Secondary | ICD-10-CM | POA: Diagnosis not present

## 2019-05-31 ENCOUNTER — Ambulatory Visit (INDEPENDENT_AMBULATORY_CARE_PROVIDER_SITE_OTHER): Payer: PPO | Admitting: Vascular Surgery

## 2019-05-31 ENCOUNTER — Other Ambulatory Visit: Payer: Self-pay

## 2019-05-31 ENCOUNTER — Encounter: Payer: Self-pay | Admitting: Vascular Surgery

## 2019-05-31 VITALS — BP 122/58

## 2019-05-31 DIAGNOSIS — I714 Abdominal aortic aneurysm, without rupture, unspecified: Secondary | ICD-10-CM

## 2019-05-31 NOTE — Progress Notes (Signed)
Virtual Visit via Telephone Note    I connected with Sheila Melton on 05/31/2019 by telephone and verified that I was speaking with the correct person using two identifiers. Patient was located at home and accompanied by her husband. I am located at [facility].   The limitations of evaluation and management by telemedicine and the availability of in person appointments have been previously discussed with the patient and are documented in the patients chart. The patient expressed understanding and consented to proceed.  PCP: Binnie Rail, MD   Chief Complaint: Asymptomatic abdominal aortic aneurysm  History of Present Illness: Sheila Melton is a 78 y.o. female with patient as discussed today by telephone regarding recent CT scan for continued evaluation of her asymptomatic infrarenal abdominal aortic aneurysm.  She had a CT scan on 05/23/2019 and I am discussing this with the patient and with her husband by speaker phone.  She has no new major medical difficulties.  No symptoms referable to her aneurysm.  She has chronic back pain is undergoing injections for this and has had prior lumbar fusion.  She has no new cardiac difficulties  Past Medical History:  Diagnosis Date  . AAA (abdominal aortic aneurysm) (HCC)    BEING WATCHED   VVS. 4.2 cm infrarenal abdominal aortic aneurysm without rupture noted 11/08/2016  . Arthritis   . Cancer ([place])    skin cancer removed from left shoulder  2017  . CAP (community acquired pneumonia) 09/02/13-11/05/13    with SIRS  . Depression    ?? from prednisone    not on pred now  . Diabetes mellitus    type ii    long time ago.  . Family history of anesthesia complication    PATIENTS MOM HAD TROUBLE WAKING UP   . GERD (gastroesophageal reflux disease)    will take occasional tums  . Heart murmur    still has.   Sees Dr. Stanford Breed  . History of kidney stones    has them at the present time  . Hyperlipidemia   . Hypertension    unspecified  essential  . Renal insufficiency    "FSGS"  . Subclavian steal syndrome    LEFT SIDE   NO SIDE EFFECTS  . Subclavian steal syndrome --  left    85% blocked  . UTI (lower urinary tract infection) 08/26/13   Enterococcus and Escherichia coli both sensitive to nitrofurantoin    Past Surgical History:  Procedure Laterality Date  . ABDOMINAL HYSTERECTOMY    . BACK SURGERY    . CATARACT EXTRACTION     OS  . CYST EXCISION Right 05-23-13   Right index finger: cyst  . DILATION AND CURETTAGE OF UTERUS    . EYE SURGERY    . g1 p1    . SPINE SURGERY  Sept. 2013  . VIDEO ASSISTED THORACOSCOPY (VATS)/ LOBECTOMY Right 02/16/2017   Procedure: Right VIDEO ASSISTED THORACOSCOPY (VATS)/ Right Middle LOBECTOMY;  Surgeon: [name], MD;  Location: [facility];  Service: Thoracic;  Laterality: Right;    Current Meds  Medication Sig  . amLODipine (NORVASC) 5 MG tablet TAKE 1 TABLET BY MOUTH TWICE DAILY.  Marland Kitchen Ascorbic Acid (VITAMIN C) 250 MG CHEW Chew 1,000 mg by mouth daily.   Marland Kitchen aspirin EC 81 MG tablet Take 1 tablet (81 mg total) by mouth daily.  . B Complex-C (SUPER B COMPLEX PO) Take 1 tablet by mouth daily.   . Blood Glucose Monitoring Suppl (  ONE TOUCH ULTRA 2) w/Device KIT Use as advised  . Cholecalciferol (VITAMIN D3) 2000 UNITS TABS Take 2,000 Units by mouth daily.   . diazepam (VALIUM) 5 MG tablet TAKE 1 TABLET AT BEDTIME AS NEEDED.  . fexofenadine (ALLEGRA) 180 MG tablet Take 180 mg by mouth at bedtime.   Marland Kitchen FLUoxetine (PROZAC) 10 MG capsule TAKE 1 CAPSULE DAILY.  . furosemide (LASIX) 40 MG tablet TAKE 1/2 TO 1 TABLET DAILY AS NEEDED FOR FLUID  . glipiZIDE (GLUCOTROL) 5 MG tablet Take 1 tablet in the morning and 2 tablets in the evening.  Marland Kitchen glucose blood (ONE TOUCH ULTRA TEST) test strip CHECK BLOOD SUGAR TWICE A DAY.  Marland Kitchen Insulin Glargine (LANTUS SOLOSTAR) 100 UNIT/ML Solostar Pen Inject 10 Units into the skin at bedtime.  Marland Kitchen labetalol (NORMODYNE) 300 MG tablet Take 1 tablet (300 mg total) by  mouth 3 (three) times daily.  . Liniments (SALONPAS PAIN RELIEF PATCH EX) Apply 1 patch topically daily as needed (back pain).  Marland Kitchen losartan (COZAAR) 100 MG tablet TAKE 1 TABLET ONCE DAILY.  . meloxicam (MOBIC) 7.5 MG tablet TAKE 1 TABLET ONCE DAILY.  . metFORMIN (GLUCOPHAGE) 500 MG tablet TAKE 1 TABLET BY MOUTH TWICE DAILY.  . rosuvastatin (CRESTOR) 20 MG tablet TAKE 1 TABLET ONCE DAILY.  . traMADol (ULTRAM) 50 MG tablet TAKE 1 TABLET DAILY AS NEEDED FOR SEVERE PAIN.  . Triamcinolone Acetonide (NASACORT ALLERGY 24HR NA) Place 1 spray into the nose 2 (two) times daily as needed (congestion).  . vitamin E 400 UNIT capsule Take 400 Units by mouth daily.    12 system ROS was negative unless otherwise noted in HPI   Observations/Objective: CT scan reveals maximum diameter of her infrarenal aorta of 4.9 cm.  This is increased from 4.2 cm in 2017 This is a noncontrast study due to her renal insufficiency.  She has small and extremely calcified iliac vessels bilaterally with no evidence of iliac artery aneurysm  Assessment and Plan: I discussed this with the patient and her husband.  I explained that her aneurysm size has remained stable and would not recommend treatment at this time.  Would recommend repeat ultrasound in 6 months.  I did discuss treatment with open aneurysm repair and stent graft repair.  Due to her iliac calcification and small size, she may not be a candidate for stent graft.  Would make further evaluation should she have progression of her aneurysm size.  Did discuss symptoms of leaking aneurysm with her and she and her husband understand the importance to present immediately to the [facility] emergency room by EMS should she develop symptoms  Follow Up Instructions:   Follow up 6 months with follow-up ultrasound   I discussed the assessment and treatment plan with the patient. The patient was provided an opportunity to ask questions and all were answered. The patient agreed with  the plan and demonstrated an understanding of the instructions.   The patient was advised to call back or seek an in-person evaluation if the symptoms worsen or if the condition fails to improve as anticipated.  I spent 12 minutes with the patient via telephone encounter.   Annamary Rummage [facility] Office: [phone]  05/31/2019, 1:12 PM

## 2019-06-09 DIAGNOSIS — M47817 Spondylosis without myelopathy or radiculopathy, lumbosacral region: Secondary | ICD-10-CM | POA: Diagnosis not present

## 2019-06-29 ENCOUNTER — Other Ambulatory Visit: Payer: Self-pay | Admitting: Internal Medicine

## 2019-06-29 NOTE — Telephone Encounter (Signed)
Control database checked last refill: 05/05/2019 30 tabs LOV: 05/18/2019 NOV: 11/21/2019

## 2019-07-04 ENCOUNTER — Other Ambulatory Visit: Payer: Self-pay | Admitting: Internal Medicine

## 2019-07-04 DIAGNOSIS — M47817 Spondylosis without myelopathy or radiculopathy, lumbosacral region: Secondary | ICD-10-CM | POA: Diagnosis not present

## 2019-07-05 DIAGNOSIS — N183 Chronic kidney disease, stage 3 (moderate): Secondary | ICD-10-CM | POA: Diagnosis not present

## 2019-07-05 DIAGNOSIS — N189 Chronic kidney disease, unspecified: Secondary | ICD-10-CM | POA: Diagnosis not present

## 2019-07-06 ENCOUNTER — Other Ambulatory Visit: Payer: Self-pay | Admitting: Internal Medicine

## 2019-07-06 NOTE — Telephone Encounter (Signed)
Last OV 05/18/19 Next OV 11/21/2019 Last RF 04/11/19

## 2019-07-12 DIAGNOSIS — N184 Chronic kidney disease, stage 4 (severe): Secondary | ICD-10-CM | POA: Diagnosis not present

## 2019-07-12 DIAGNOSIS — N2581 Secondary hyperparathyroidism of renal origin: Secondary | ICD-10-CM | POA: Diagnosis not present

## 2019-07-12 DIAGNOSIS — N051 Unspecified nephritic syndrome with focal and segmental glomerular lesions: Secondary | ICD-10-CM | POA: Diagnosis not present

## 2019-07-12 DIAGNOSIS — D631 Anemia in chronic kidney disease: Secondary | ICD-10-CM | POA: Diagnosis not present

## 2019-07-12 DIAGNOSIS — I129 Hypertensive chronic kidney disease with stage 1 through stage 4 chronic kidney disease, or unspecified chronic kidney disease: Secondary | ICD-10-CM | POA: Diagnosis not present

## 2019-08-16 DIAGNOSIS — Z981 Arthrodesis status: Secondary | ICD-10-CM | POA: Insufficient documentation

## 2019-08-16 DIAGNOSIS — M47817 Spondylosis without myelopathy or radiculopathy, lumbosacral region: Secondary | ICD-10-CM | POA: Diagnosis not present

## 2019-08-23 DIAGNOSIS — N051 Unspecified nephritic syndrome with focal and segmental glomerular lesions: Secondary | ICD-10-CM | POA: Diagnosis not present

## 2019-08-24 ENCOUNTER — Ambulatory Visit (INDEPENDENT_AMBULATORY_CARE_PROVIDER_SITE_OTHER): Payer: PPO | Admitting: Internal Medicine

## 2019-08-24 ENCOUNTER — Encounter: Payer: Self-pay | Admitting: Internal Medicine

## 2019-08-24 ENCOUNTER — Other Ambulatory Visit: Payer: Self-pay

## 2019-08-24 VITALS — BP 150/68 | HR 87 | Temp 98.4°F | Resp 16 | Ht 64.0 in | Wt 158.0 lb

## 2019-08-24 DIAGNOSIS — E1159 Type 2 diabetes mellitus with other circulatory complications: Secondary | ICD-10-CM | POA: Diagnosis not present

## 2019-08-24 DIAGNOSIS — I10 Essential (primary) hypertension: Secondary | ICD-10-CM | POA: Diagnosis not present

## 2019-08-24 DIAGNOSIS — Z23 Encounter for immunization: Secondary | ICD-10-CM

## 2019-08-24 MED ORDER — COMFORT EZ PEN NEEDLES 31G X 5 MM MISC: 3 refills | Status: DC

## 2019-08-24 NOTE — Assessment & Plan Note (Signed)
Sugars poorly controlled since starting Prograf and this is likely a side effect of the medication Sugars have been over 200 and they had been very well controlled prior to the medication Continue metformin at current dose Will discontinue glipizide Start Lantus-this is her first time taking insulin and we will start at 10 units daily-she will take in the morning.  Discussed that we will need to increase this over the next couple of days to get her sugars better controlled.  She is aware that this can cause hypoglycemia She will call with any questions or concerns and she will call in the next couple of days so that we can adjust her medication

## 2019-08-24 NOTE — Patient Instructions (Addendum)
Monitor your BP and sugar closely and let us know your readings.    Flu immunization administered today.     Medications reviewed and updated.  Changes include :   Start lantus 10 units daily.  Stop the glipizide.  Continue the metformin.      Your prescription(s) have been submitted to your pharmacy. Please take as directed and contact our office if you believe you are having problem(s) with the medication(s).

## 2019-08-24 NOTE — Assessment & Plan Note (Signed)
Blood pressure not ideally controlled since starting Prograf and the elevation is likely a side effect of the medication She will start monitoring her blood pressure 2-3 times a day and let me know the results so we can see if we need to adjust her medication-we will need to look at the average overall Continue current medications Can consider increasing labetalol to 400 mg 3 times daily or possibly adding hydralazine

## 2019-08-24 NOTE — Progress Notes (Signed)
Subjective:    Patient ID: Sheila Melton, female    DOB: [birth year], 78 y.o.   MRN: [id number]  HPI The patient is here for an acute visit for high sugar.   She had an injection in her back w/o steroids 3 weeks ago and her sugars never went up.    She was started on prograf 10/6.  Since then her BP and sugars have been elevated.    Diabetes:  She is taking her medications as prescribed.  Her sugars were very well controlled prior to starting Prograf, but since starting the medication they have been elevated.  She denies any changes in her lifestyle.  This morning fasting it was 200.  After eating cereal and taking her medications about 2 hrs later it was 348.  It has been above 200 since starting the prograf.    She has had some headaches, lightheadedness and dizziness since starting the Prograf.  She is unsure if it is related to the medication or the side effects from it.  She has not had any chest pain or palpitations.  Elevated blood pressure: She is taking her blood pressure medication daily as prescribed and her blood pressure was well controlled prior to starting Prograf.  Since then it has been elevated.  It is similar to what she had here.  She has not been taking it as regularly as her sugar.   Medications and allergies reviewed with patient and updated if appropriate.  Patient Active Problem List   Diagnosis Date Noted  . Constipation 11/17/2018  . Skin yeast infection 02/26/2017  . 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 Osteoporosis 12/25/2016  . Depression 08/15/2016  . 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 CKD (chronic kidney disease) 05/21/2016  . Sleep difficulties 01/18/2016  . Diabetes mellitus ([place]) 10/23/2015  . Numbness of toes-Right foot 06/26/2015  . Colitis 03/19/2015  . Nephrolithiasis 03/19/2015  . AAA (abdominal aortic aneurysm) without rupture ([place]) 03/19/2015  . Anxiety state 03/19/2015  . Skin cancer 01/09/2015  . Former smoker  05/24/2014  . Bilateral renal cysts 09/16/2012  . 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[place]) 07/26/2012  . Degenerative spondylolisthesis 07/26/2012  . SHOULDER PAIN, LEFT, CHRONIC 07/25/2009  . CARDIAC MURMUR, SYSTOLIC 41/93/7902  . Mixed hyperlipidemia 12/30/2007  . Tobacco use disorder 12/30/2007  . Essential hypertension 12/30/2007  . Subclavian steal syndrome 12/30/2007  . CAROTID BRUIT 04/07/2007    Current Outpatient Medications on File Prior to Visit  Medication Sig Dispense Refill  . amLODipine (NORVASC) 5 MG tablet TAKE 1 TABLET BY MOUTH TWICE DAILY. 180 tablet 1  . Ascorbic Acid (VITAMIN C) 250 MG CHEW Chew 1,000 mg by mouth daily.     . B Complex-C (SUPER B COMPLEX PO) Take 1 tablet by mouth daily.     . Blood Glucose Monitoring Suppl (ONE TOUCH ULTRA 2) w/Device KIT Use as advised 1 each 0  . Cholecalciferol (VITAMIN D3) 2000 UNITS TABS Take 2,000 Units by mouth daily.     . diazepam (VALIUM) 5 MG tablet TAKE 1 TABLET AT BEDTIME AS NEEDED. 90 tablet 0  . fexofenadine (ALLEGRA) 180 MG tablet Take 180 mg by mouth at bedtime.     Marland Kitchen FLUoxetine (PROZAC) 10 MG capsule TAKE 1 CAPSULE DAILY. 90 capsule 1  . furosemide (LASIX) 40 MG tablet TAKE 1/2 TO 1 TABLET DAILY AS NEEDED FOR FLUID 90 tablet 0  . gabapentin (NEURONTIN) 300 MG capsule     . glipiZIDE (GLUCOTROL)  5 MG tablet TAKE 1 TABLET IN THE MORNING AND 2 TABLETS IN THE EVENING. 270 tablet 1  . glucose blood (ONE TOUCH ULTRA TEST) test strip CHECK BLOOD SUGAR TWICE A DAY. 180 each 1  . Insulin Glargine (LANTUS SOLOSTAR) 100 UNIT/ML Solostar Pen Inject 10 Units into the skin at bedtime. 1 pen 0  . labetalol (NORMODYNE) 300 MG tablet Take 1 tablet (300 mg total) by mouth 3 (three) times daily.    . Liniments (SALONPAS PAIN RELIEF PATCH EX) Apply 1 patch topically daily as needed (back pain).    Marland Kitchen losartan (COZAAR) 100 MG tablet TAKE 1 TABLET ONCE DAILY. 90 tablet 0  . meloxicam (MOBIC) 7.5 MG tablet TAKE 1 TABLET  ONCE DAILY. 90 tablet 1  . metFORMIN (GLUCOPHAGE) 500 MG tablet TAKE 1 TABLET BY MOUTH TWICE DAILY. 180 tablet 1  . rosuvastatin (CRESTOR) 20 MG tablet TAKE 1 TABLET ONCE DAILY. 90 tablet 1  . tacrolimus (PROGRAF) 1 MG capsule Take 1 mg by mouth 2 (two) times daily. Take 2 tablets by mouth twice a day-started 08/16/19    . traMADol (ULTRAM) 50 MG tablet TAKE 1 TABLET DAILY AS NEEDED FOR SEVERE PAIN. 30 tablet 0  . Triamcinolone Acetonide (NASACORT ALLERGY 24HR NA) Place 1 spray into the nose 2 (two) times daily as needed (congestion).    . vitamin E 400 UNIT capsule Take 400 Units by mouth daily.     No current facility-administered medications on file prior to visit.     Past Medical History:  Diagnosis Date  . AAA (abdominal aortic aneurysm) (HCC)    BEING WATCHED   VVS. 4.2 cm infrarenal abdominal aortic aneurysm without rupture noted 11/08/2016  . Arthritis   . Cancer ([place])    skin cancer removed from left shoulder  2017  . CAP (community acquired pneumonia) 09/02/13-11/05/13    with SIRS  . Depression    ?? from prednisone    not on pred now  . Diabetes mellitus    type ii    long time ago.  . Family history of anesthesia complication    PATIENTS MOM HAD TROUBLE WAKING UP   . GERD (gastroesophageal reflux disease)    will take occasional tums  . Heart murmur    still has.   Sees Dr. Stanford Breed  . History of kidney stones    has them at the present time  . Hyperlipidemia   . Hypertension    unspecified essential  . Renal insufficiency    "FSGS"  . Subclavian steal syndrome    LEFT SIDE   NO SIDE EFFECTS  . Subclavian steal syndrome --  left    85% blocked  . UTI (lower urinary tract infection) 08/26/13   Enterococcus and Escherichia coli both sensitive to nitrofurantoin    Past Surgical History:  Procedure Laterality Date  . ABDOMINAL HYSTERECTOMY    . BACK SURGERY    . CATARACT EXTRACTION     OS  . CYST EXCISION Right 05-23-13   Right index finger: cyst  .  DILATION AND CURETTAGE OF UTERUS    . EYE SURGERY    . g1 p1    . SPINE SURGERY  Sept. 2013  . VIDEO ASSISTED THORACOSCOPY (VATS)/ LOBECTOMY Right 02/16/2017   Procedure: Right VIDEO ASSISTED THORACOSCOPY (VATS)/ Right Middle LOBECTOMY;  Surgeon: [name], MD;  Location: [facility];  Service: Thoracic;  Laterality: Right;    Social History   Socioeconomic History  . Marital status: Married  Spouse name: Not on file  . Number of children: 1  . Years of education: Not on file  . Highest education level: Not on file  Occupational History  . Not on file  Social Needs  . Financial resource strain: Not on file  . Food insecurity    Worry: Not on file    Inability: Not on file  . Transportation needs    Medical: Not on file    Non-medical: Not on file  Tobacco Use  . Smoking status: Light Tobacco Smoker    Packs/day: 1.00    Years: 50.00    Pack years: 50.00    Types: E-cigarettes  . Smokeless tobacco: Never Used  . Tobacco comment: e-cigs  Substance and Sexual Activity  . Alcohol use: No  . Drug use: No  . Sexual activity: Not on file  Lifestyle  . Physical activity    Days per week: Not on file    Minutes per session: Not on file  . Stress: Not on file  Relationships  . Social Herbalist on phone: Not on file    Gets together: Not on file    Attends religious service: Not on file    Active member of club or organization: Not on file    Attends meetings of clubs or organizations: Not on file    Relationship status: Not on file  Other Topics Concern  . Not on file  Social History Narrative   Has 3 caffeinated bev a day      Exercise: none    Family History  Problem Relation Age of Onset  . Heart failure Mother   . Heart disease Mother   . Hypertension Mother   . Other Mother        varicose veins  . Deep vein thrombosis Mother   . Varicose Veins Mother   . Heart attack Mother   . Hypertension Sister        X 2  . Diabetes Sister   .  Hyperlipidemia Sister   . Other Sister        varicose veins  . Heart disease Father   . Diabetes Father   . Hyperlipidemia Father   . Hypertension Father   . Heart attack Father   . Heart disease Brother        MI in late 61s  . Hyperlipidemia Brother   . Heart attack Brother   . Coronary artery disease Other   . Diabetes Other   . Parkinsonism Brother   . Heart failure Brother   . Hypertension Daughter     Review of Systems  Constitutional: Negative for fever.  Cardiovascular: Negative for chest pain, palpitations and leg swelling.  Neurological: Positive for dizziness, light-headedness and headaches.       Objective:   Vitals:   08/24/19 1518  BP: (!) 150/68  Pulse: 87  Resp: 16  Temp: 98.4 F (36.9 C)  SpO2: 98%   BP Readings from Last 3 Encounters:  08/24/19 (!) 150/68  05/31/19 (!) 122/58  05/18/19 (!) 122/58   Wt Readings from Last 3 Encounters:  08/24/19 158 lb (71.7 kg)  05/18/19 161 lb (73 kg)  02/21/19 158 lb (71.7 kg)   Body mass index is 27.12 kg/m.   Physical Exam         Assessment & Plan:    30 minutes were spent face-to-face with the patient, over 50% of which was spent counseling regarding her elevated sugar,  which is secondary to Prograf.  We discussed treatment options and need to have her start on the Lantus insulin once a day.  Reviewed how to take this and she was instructed by a nurse on the proper technique.  Unfortunately because this is related to a medication there is nothing further she can do with her lifestyle.  We also discussed elevated blood pressure, which is also a side effect from the Prograf.  We will hold off on further changes in medication.  She will monitor her blood pressure closely and we will adjust medication as needed.    See Problem List for Assessment and Plan of chronic medical problems.

## 2019-08-29 DIAGNOSIS — E119 Type 2 diabetes mellitus without complications: Secondary | ICD-10-CM | POA: Diagnosis not present

## 2019-08-29 LAB — HM DIABETES EYE EXAM

## 2019-08-31 DIAGNOSIS — N184 Chronic kidney disease, stage 4 (severe): Secondary | ICD-10-CM | POA: Diagnosis not present

## 2019-08-31 DIAGNOSIS — N189 Chronic kidney disease, unspecified: Secondary | ICD-10-CM | POA: Diagnosis not present

## 2019-08-31 DIAGNOSIS — N051 Unspecified nephritic syndrome with focal and segmental glomerular lesions: Secondary | ICD-10-CM | POA: Diagnosis not present

## 2019-09-12 ENCOUNTER — Telehealth: Payer: Self-pay

## 2019-09-12 ENCOUNTER — Other Ambulatory Visit: Payer: Self-pay | Admitting: Internal Medicine

## 2019-09-12 NOTE — Telephone Encounter (Signed)
Pt brought by updated BS and BP readings. Per Dr. Quay Burow BP was good and for BS increase lantus to 15 units daily. Pt is aware of the change in lantus and will keep [place] updated with sugars.

## 2019-09-12 NOTE — Telephone Encounter (Signed)
Last OV 08/24/19 Next OV 11/21/19 Last RF 06/29/19

## 2019-09-15 DIAGNOSIS — M4310 Spondylolisthesis, site unspecified: Secondary | ICD-10-CM | POA: Diagnosis not present

## 2019-09-19 DIAGNOSIS — M5417 Radiculopathy, lumbosacral region: Secondary | ICD-10-CM | POA: Diagnosis not present

## 2019-09-29 ENCOUNTER — Other Ambulatory Visit: Payer: Self-pay | Admitting: Internal Medicine

## 2019-09-29 MED ORDER — DIAZEPAM 5 MG PO TABS: 5.0000 mg | ORAL_TABLET | Freq: Every evening | ORAL | 0 refills | Status: DC | PRN

## 2019-10-05 ENCOUNTER — Other Ambulatory Visit: Payer: Self-pay

## 2019-10-24 DIAGNOSIS — Z981 Arthrodesis status: Secondary | ICD-10-CM | POA: Diagnosis not present

## 2019-11-09 DIAGNOSIS — N184 Chronic kidney disease, stage 4 (severe): Secondary | ICD-10-CM | POA: Diagnosis not present

## 2019-11-20 NOTE — Patient Instructions (Addendum)
  Blood work was ordered.     Medications reviewed and updated.  Changes include :   none  Your prescription(s) have been submitted to your pharmacy. Please take as directed and contact our office if you believe you are having problem(s) with the medication(s).   Please followup in 6 months   

## 2019-11-20 NOTE — Progress Notes (Signed)
Subjective:    Patient ID: Sheila Melton, female    DOB: [birth year], 79 y.o.   MRN: [id number]  HPI The patient is here for follow up of their chronic medical problems, including diabetes, CKD, hypertension, hyperlipidemia, anxiety, depression, chronic back pain, sleep difficulties.   She is taking all of her medications as prescribed.   She is not exercising regularly.      She is off the medication she was taking for her kidneys because it was causing her sugars to be too high.   Her sugars have been on average 117.   She saw Dr Trenton Gammon for her chronic back pain.  He did an MRI - she has arthritis from the surgery he did several years ago.  She saw Dr Maryjean Ka and had 4 injections and none helped.  They have her Vicodin 1-2 a day.  He did say at the next visit she needs more back surgery, but he can not do that until her aneurysm.  Her aneurysm is not ready for surgery.       Medications and allergies reviewed with patient and updated if appropriate.  Patient Active Problem List   Diagnosis Date Noted  . Constipation 11/17/2018  . Skin yeast infection 02/26/2017  . 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 Osteoporosis 12/25/2016  . Depression 08/15/2016  . 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 CKD (chronic kidney disease) 05/21/2016  . Sleep difficulties 01/18/2016  . Diabetes mellitus ([place]) 10/23/2015  . Numbness of toes-Right foot 06/26/2015  . Colitis 03/19/2015  . Nephrolithiasis 03/19/2015  . AAA (abdominal aortic aneurysm) without rupture ([place]) 03/19/2015  . Anxiety state 03/19/2015  . Skin cancer 01/09/2015  . Former smoker 05/24/2014  . Bilateral renal cysts 09/16/2012  . 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[place]) 07/26/2012  . Degenerative spondylolisthesis 07/26/2012  . SHOULDER PAIN, LEFT, CHRONIC 07/25/2009  . CARDIAC MURMUR, SYSTOLIC 76/22/6333  . Mixed hyperlipidemia 12/30/2007  . Tobacco use disorder 12/30/2007  . Essential  hypertension 12/30/2007  . Subclavian steal syndrome 12/30/2007  . CAROTID BRUIT 04/07/2007    Current Outpatient Medications on File Prior to Visit  Medication Sig Dispense Refill  . amLODipine (NORVASC) 5 MG tablet TAKE 1 TABLET BY MOUTH TWICE DAILY. 180 tablet 1  . Ascorbic Acid (VITAMIN C) 250 MG CHEW Chew 1,000 mg by mouth daily.     . B Complex-C (SUPER B COMPLEX PO) Take 1 tablet by mouth daily.     . Blood Glucose Monitoring Suppl (ONE TOUCH ULTRA 2) w/Device KIT Use as advised 1 each 0  . Cholecalciferol (VITAMIN D3) 2000 UNITS TABS Take 2,000 Units by mouth daily.     . diazepam (VALIUM) 5 MG tablet Take 1 tablet (5 mg total) by mouth at bedtime as needed. 90 tablet 0  . fexofenadine (ALLEGRA) 180 MG tablet Take 180 mg by mouth at bedtime.     Marland Kitchen FLUoxetine (PROZAC) 10 MG capsule TAKE 1 CAPSULE DAILY. 90 capsule 1  . furosemide (LASIX) 40 MG tablet TAKE 1/2 TO 1 TABLET DAILY AS NEEDED FOR FLUID 90 tablet 0  . gabapentin (NEURONTIN) 300 MG capsule     . glipiZIDE (GLUCOTROL) 5 MG tablet     . glucose blood (ONE TOUCH ULTRA TEST) test strip CHECK BLOOD SUGAR TWICE A DAY. 180 each 1  . HYDROcodone-acetaminophen (NORCO/VICODIN) 5-325 MG tablet     . labetalol (NORMODYNE) 300 MG tablet Take 1 tablet (300 mg total) by mouth 3 (three) times  daily.    . Liniments (SALONPAS PAIN RELIEF PATCH EX) Apply 1 patch topically daily as needed (back pain).    Marland Kitchen losartan (COZAAR) 100 MG tablet Take 1 tablet (100 mg total) by mouth daily. Annual appt due in January must see provider for future refills 90 tablet 0  . meloxicam (MOBIC) 7.5 MG tablet TAKE 1 TABLET ONCE DAILY. 90 tablet 1  . metFORMIN (GLUCOPHAGE) 500 MG tablet TAKE 1 TABLET BY MOUTH TWICE DAILY. 180 tablet 1  . rosuvastatin (CRESTOR) 20 MG tablet TAKE 1 TABLET ONCE DAILY. 90 tablet 1  . traMADol (ULTRAM) 50 MG tablet TAKE 1 TABLET DAILY AS NEEDED FOR SEVERE PAIN. 30 tablet 0  . Triamcinolone Acetonide (NASACORT ALLERGY 24HR NA) Place 1  spray into the nose 2 (two) times daily as needed (congestion).    . vitamin E 400 UNIT capsule Take 400 Units by mouth daily.     No current facility-administered medications on file prior to visit.    Past Medical History:  Diagnosis Date  . AAA (abdominal aortic aneurysm) (HCC)    BEING WATCHED   VVS. 4.2 cm infrarenal abdominal aortic aneurysm without rupture noted 11/08/2016  . Arthritis   . Cancer ([place])    skin cancer removed from left shoulder  2017  . CAP (community acquired pneumonia) 09/02/13-11/05/13    with SIRS  . Depression    ?? from prednisone    not on pred now  . Diabetes mellitus    type ii    long time ago.  . Family history of anesthesia complication    PATIENTS MOM HAD TROUBLE WAKING UP   . GERD (gastroesophageal reflux disease)    will take occasional tums  . Heart murmur    still has.   Sees Dr. Stanford Breed  . History of kidney stones    has them at the present time  . Hyperlipidemia   . Hypertension    unspecified essential  . Renal insufficiency    "FSGS"  . Subclavian steal syndrome    LEFT SIDE   NO SIDE EFFECTS  . Subclavian steal syndrome --  left    85% blocked  . UTI (lower urinary tract infection) 08/26/13   Enterococcus and Escherichia coli both sensitive to nitrofurantoin    Past Surgical History:  Procedure Laterality Date  . ABDOMINAL HYSTERECTOMY    . BACK SURGERY    . CATARACT EXTRACTION     OS  . CYST EXCISION Right 05-23-13   Right index finger: cyst  . DILATION AND CURETTAGE OF UTERUS    . EYE SURGERY    . g1 p1    . SPINE SURGERY  Sept. 2013  . VIDEO ASSISTED THORACOSCOPY (VATS)/ LOBECTOMY Right 02/16/2017   Procedure: Right VIDEO ASSISTED THORACOSCOPY (VATS)/ Right Middle LOBECTOMY;  Surgeon: [name], MD;  Location: [facility];  Service: Thoracic;  Laterality: Right;    Social History   Socioeconomic History  . Marital status: Married    Spouse name: Not on file  . Number of children: 1  . Years of education:  Not on file  . Highest education level: Not on file  Occupational History  . Not on file  Tobacco Use  . Smoking status: Light Tobacco Smoker    Packs/day: 1.00    Years: 50.00    Pack years: 50.00    Types: E-cigarettes  . Smokeless tobacco: Never Used  . Tobacco comment: e-cigs  Substance and Sexual Activity  . Alcohol use:  No  . Drug use: No  . Sexual activity: Not on file  Other Topics Concern  . Not on file  Social History Narrative   Has 3 caffeinated bev a day      Exercise: none   Social Determinants of Health   Financial Resource Strain:   . Difficulty of Paying Living Expenses: Not on file  Food Insecurity:   . Worried About Charity fundraiser in the Last Year: Not on file  . Ran Out of Food in the Last Year: Not on file  Transportation Needs:   . Lack of Transportation (Medical): Not on file  . Lack of Transportation (Non-Medical): Not on file  Physical Activity:   . Days of Exercise per Week: Not on file  . Minutes of Exercise per Session: Not on file  Stress:   . Feeling of Stress : Not on file  Social Connections:   . Frequency of Communication with Friends and Family: Not on file  . Frequency of Social Gatherings with Friends and Family: Not on file  . Attends Religious Services: Not on file  . Active Member of Clubs or Organizations: Not on file  . Attends Archivist Meetings: Not on file  . Marital Status: Not on file    Family History  Problem Relation Age of Onset  . Heart failure Mother   . Heart disease Mother   . Hypertension Mother   . Other Mother        varicose veins  . Deep vein thrombosis Mother   . Varicose Veins Mother   . Heart attack Mother   . Hypertension Sister        X 2  . Diabetes Sister   . Hyperlipidemia Sister   . Other Sister        varicose veins  . Heart disease Father   . Diabetes Father   . Hyperlipidemia Father   . Hypertension Father   . Heart attack Father   . Heart disease Brother         MI in late 56s  . Hyperlipidemia Brother   . Heart attack Brother   . Coronary artery disease Other   . Diabetes Other   . Parkinsonism Brother   . Heart failure Brother   . Hypertension Daughter     Review of Systems  Constitutional: Negative for chills and fever.  Respiratory: Positive for cough (from dry air at night). Negative for shortness of breath and wheezing.   Cardiovascular: Negative for chest pain, palpitations and leg swelling.  Neurological: Negative for dizziness, light-headedness and headaches.  Psychiatric/Behavioral: Positive for sleep disturbance. The patient is nervous/anxious.        Objective:   Vitals:   11/21/19 1405  BP: 132/70  Pulse: 72  Resp: 16  Temp: 98.7 F (37.1 C)  SpO2: 98%   BP Readings from Last 3 Encounters:  11/21/19 132/70  08/24/19 (!) 150/68  05/31/19 (!) 122/58   Wt Readings from Last 3 Encounters:  11/21/19 157 lb (71.2 kg)  08/24/19 158 lb (71.7 kg)  05/18/19 161 lb (73 kg)   Body mass index is 26.95 kg/m.   Physical Exam    Constitutional: Appears well-developed and well-nourished. No distress.  HENT:  Head: Normocephalic and atraumatic.  Neck: Neck supple. No tracheal deviation present. No thyromegaly present.  No cervical lymphadenopathy Cardiovascular: Normal rate, regular rhythm and normal heart sounds.   2/6 systolic murmur heard. No carotid bruit .  No edema  Pulmonary/Chest: Effort normal and breath sounds normal. No respiratory distress. No has no wheezes. No rales.  Skin: Skin is warm and dry. Not diaphoretic.  Psychiatric: Normal mood and affect. Behavior is normal.      Assessment & Plan:    See Problem List for Assessment and Plan of chronic medical problems.    This visit occurred during the SARS-CoV-2 public health emergency.  Safety protocols were in place, including screening questions prior to the visit, additional usage of staff PPE, and extensive cleaning of exam room while observing  appropriate contact time as indicated for disinfecting solutions.

## 2019-11-21 ENCOUNTER — Ambulatory Visit (INDEPENDENT_AMBULATORY_CARE_PROVIDER_SITE_OTHER): Payer: PPO | Admitting: Internal Medicine

## 2019-11-21 ENCOUNTER — Other Ambulatory Visit: Payer: Self-pay

## 2019-11-21 ENCOUNTER — Encounter: Payer: Self-pay | Admitting: Internal Medicine

## 2019-11-21 VITALS — BP 132/70 | HR 72 | Temp 98.7°F | Resp 16 | Ht 64.0 in | Wt 157.0 lb

## 2019-11-21 DIAGNOSIS — N1832 Chronic kidney disease, stage 3b: Secondary | ICD-10-CM

## 2019-11-21 DIAGNOSIS — G9519 Other vascular myelopathies: Secondary | ICD-10-CM | POA: Diagnosis not present

## 2019-11-21 DIAGNOSIS — F3289 Other specified depressive episodes: Secondary | ICD-10-CM | POA: Diagnosis not present

## 2019-11-21 DIAGNOSIS — E1159 Type 2 diabetes mellitus with other circulatory complications: Secondary | ICD-10-CM | POA: Diagnosis not present

## 2019-11-21 DIAGNOSIS — I10 Essential (primary) hypertension: Secondary | ICD-10-CM | POA: Diagnosis not present

## 2019-11-21 DIAGNOSIS — E782 Mixed hyperlipidemia: Secondary | ICD-10-CM

## 2019-11-21 DIAGNOSIS — G479 Sleep disorder, unspecified: Secondary | ICD-10-CM

## 2019-11-21 DIAGNOSIS — M4807 Spinal stenosis, lumbosacral region: Secondary | ICD-10-CM | POA: Diagnosis not present

## 2019-11-21 DIAGNOSIS — F411 Generalized anxiety disorder: Secondary | ICD-10-CM | POA: Diagnosis not present

## 2019-11-21 LAB — COMPREHENSIVE METABOLIC PANEL
ALT: 14 U/L (ref 0–35)
AST: 16 U/L (ref 0–37)
Albumin: 4.3 g/dL (ref 3.5–5.2)
Alkaline Phosphatase: 66 U/L (ref 39–117)
BUN: 27 mg/dL — ABNORMAL HIGH (ref 6–23)
CO2: 25 mEq/L (ref 19–32)
Calcium: 10 mg/dL (ref 8.4–10.5)
Chloride: 104 mEq/L (ref 96–112)
Creatinine, Ser: 2.02 mg/dL — ABNORMAL HIGH (ref 0.40–1.20)
GFR: 23.89 mL/min — ABNORMAL LOW (ref >60.00)
Glucose, Bld: 162 mg/dL — ABNORMAL HIGH (ref 70–99)
Potassium: 3.8 mEq/L (ref 3.5–5.1)
Sodium: 139 mEq/L (ref 135–145)
Total Bilirubin: 0.3 mg/dL (ref 0.2–1.2)
Total Protein: 7.2 g/dL (ref 6.0–8.3)

## 2019-11-21 LAB — LIPID PANEL
Cholesterol: 121 mg/dL (ref 0–200)
HDL: 39.3 mg/dL (ref >39.00)
NonHDL: 81.23
Total CHOL/HDL Ratio: 3
Triglycerides: 294 mg/dL — ABNORMAL HIGH (ref 0.0–149.0)
VLDL: 58.8 mg/dL — ABNORMAL HIGH (ref 0.0–40.0)

## 2019-11-21 LAB — LDL CHOLESTEROL, DIRECT: Direct LDL: 43 mg/dL

## 2019-11-21 NOTE — Assessment & Plan Note (Addendum)
Chronic BP well controlled Current regimen effective and well tolerated Continue current medications at current doses CMP

## 2019-11-21 NOTE — Assessment & Plan Note (Signed)
Chronic Some increased anxiety from COVID pandemic Taking prozac 10 mg, valium nightly Controlled, stable Continue current doses of medications

## 2019-11-21 NOTE — Assessment & Plan Note (Signed)
Chronic Sleep is good, controlled Taking valium nightly Has been on medication for years, no side effects  will continue

## 2019-11-21 NOTE — Assessment & Plan Note (Signed)
Chronic Check lipid panel  Continue daily statin Regular exercise and healthy diet encouraged  

## 2019-11-21 NOTE — Assessment & Plan Note (Signed)
Chronic lower back pain Pain is severe and limits her significantly Was taking tramadol as needed, but recently saw Dr. Trenton Gammon and Dr. Maryjean Ka and was prescribed Vicodin, which she is currently taking Discussed that she needs to take 1 or the other, not both Injections not helpful in her back Will need surgery, but this cannot be done until her aneurysm is repaired and it is not ready for surgery Taking meloxicam daily-continue

## 2019-11-21 NOTE — Assessment & Plan Note (Signed)
Following with Dr Posey Pronto Chronic, FSGS Prograf d/c'd due to elevated sugars Has f/u schedule with Dr Posey Pronto later this month

## 2019-11-21 NOTE — Assessment & Plan Note (Signed)
Chronic Controlled, stable Continue current dose of medication-Prozac 10 mg daily

## 2019-11-21 NOTE — Assessment & Plan Note (Signed)
Chronic Sugars well controlled at home since prograf discontinued On metformin and glipizide Check a1c Will adjust medication if needed

## 2019-11-22 ENCOUNTER — Encounter: Payer: Self-pay | Admitting: Internal Medicine

## 2019-11-22 LAB — HEMOGLOBIN A1C: Hgb A1c MFr Bld: 6.6 % — ABNORMAL HIGH (ref 4.6–6.5)

## 2019-11-29 ENCOUNTER — Ambulatory Visit: Payer: PPO | Attending: Internal Medicine

## 2019-11-29 DIAGNOSIS — Z23 Encounter for immunization: Secondary | ICD-10-CM | POA: Insufficient documentation

## 2019-11-29 NOTE — Progress Notes (Signed)
   Covid-[facility] Vaccination [facility]  Name:  Sheila Melton    MRN: [id number] DOB: 1941-02-25  11/29/2019  Sheila Melton was observed post Covid-19 immunization for 15 minutes without incidence. She was provided with Vaccine Information Sheet and instruction to access the V-Safe system.   Sheila Melton was instructed to call 911 with any severe reactions post vaccine: Marland Kitchen Difficulty breathing  . Swelling of your face and throat  . A fast heartbeat  . A bad rash all over your body  . Dizziness and weakness    Immunizations Administered    Name Date Dose VIS Date Route   Pfizer COVID-19 Vaccine 11/29/2019  9:28 AM 0.3 mL 10/21/2019 Intramuscular   Manufacturer: [employer], [employer]   Lot: [id number]   [place]: [id number]

## 2019-12-09 DIAGNOSIS — N184 Chronic kidney disease, stage 4 (severe): Secondary | ICD-10-CM | POA: Diagnosis not present

## 2019-12-09 DIAGNOSIS — I129 Hypertensive chronic kidney disease with stage 1 through stage 4 chronic kidney disease, or unspecified chronic kidney disease: Secondary | ICD-10-CM | POA: Diagnosis not present

## 2019-12-09 DIAGNOSIS — N189 Chronic kidney disease, unspecified: Secondary | ICD-10-CM | POA: Diagnosis not present

## 2019-12-09 DIAGNOSIS — D631 Anemia in chronic kidney disease: Secondary | ICD-10-CM | POA: Diagnosis not present

## 2019-12-09 DIAGNOSIS — N051 Unspecified nephritic syndrome with focal and segmental glomerular lesions: Secondary | ICD-10-CM | POA: Diagnosis not present

## 2019-12-09 DIAGNOSIS — N2581 Secondary hyperparathyroidism of renal origin: Secondary | ICD-10-CM | POA: Diagnosis not present

## 2019-12-12 ENCOUNTER — Other Ambulatory Visit: Payer: Self-pay

## 2019-12-12 DIAGNOSIS — I714 Abdominal aortic aneurysm, without rupture, unspecified: Secondary | ICD-10-CM

## 2019-12-13 ENCOUNTER — Ambulatory Visit (INDEPENDENT_AMBULATORY_CARE_PROVIDER_SITE_OTHER): Payer: PPO | Admitting: Vascular Surgery

## 2019-12-13 ENCOUNTER — Ambulatory Visit (HOSPITAL_COMMUNITY)
Admission: RE | Admit: 2019-12-13 | Discharge: 2019-12-13 | Disposition: A | Discharge status: Home / Self Care | Payer: PPO | Source: Ambulatory Visit | Attending: Vascular Surgery | Admitting: Vascular Surgery

## 2019-12-13 ENCOUNTER — Other Ambulatory Visit: Payer: Self-pay

## 2019-12-13 ENCOUNTER — Encounter: Payer: Self-pay | Admitting: Vascular Surgery

## 2019-12-13 VITALS — BP 129/73 | HR 83 | Temp 98.0°F | Resp 20 | Ht 64.0 in | Wt 157.3 lb

## 2019-12-13 DIAGNOSIS — I714 Abdominal aortic aneurysm, without rupture, unspecified: Secondary | ICD-10-CM

## 2019-12-13 NOTE — Progress Notes (Signed)
Vascular and Vein Specialist of [facility]  Patient name: Sheila Melton MRN: [id number] DOB: 30-Nov-1940 Sex: female  REASON FOR VISIT: Follow-up known abdominal aortic aneurysm  HPI: Sheila Melton is a 79 y.o. female here today for follow-up.  I am also discussing this by telephone with her husband due to Covid visitation restrictions.  She reports no new difficulty.  She does have history of prior lumbar surgery and is having recurrent issues regarding this.  There is some consideration of recurrent surgery.  He has no symptoms referable to her aneurysm.  Reports that her renal function is stable  Past Medical History:  Diagnosis Date  . AAA (abdominal aortic aneurysm) (HCC)    BEING WATCHED   VVS. 4.2 cm infrarenal abdominal aortic aneurysm without rupture noted 11/08/2016  . Arthritis   . Cancer ([place])    skin cancer removed from left shoulder  2017  . CAP (community acquired pneumonia) 09/02/13-11/05/13    with SIRS  . Depression    ?? from prednisone    not on pred now  . Diabetes mellitus    type ii    long time ago.  . Family history of anesthesia complication    PATIENTS MOM HAD TROUBLE WAKING UP   . GERD (gastroesophageal reflux disease)    will take occasional tums  . Heart murmur    still has.   Sees Dr. Stanford Breed  . History of kidney stones    has them at the present time  . Hyperlipidemia   . Hypertension    unspecified essential  . Renal insufficiency    "FSGS"  . Subclavian steal syndrome    LEFT SIDE   NO SIDE EFFECTS  . Subclavian steal syndrome --  left    85% blocked  . UTI (lower urinary tract infection) 08/26/13   Enterococcus and Escherichia coli both sensitive to nitrofurantoin    Family History  Problem Relation Age of Onset  . Heart failure Mother   . Heart disease Mother   . Hypertension Mother   . Other Mother        varicose veins  . Deep vein thrombosis Mother   . Varicose Veins Mother   . Heart attack  Mother   . Hypertension Sister        X 2  . Diabetes Sister   . Hyperlipidemia Sister   . Other Sister        varicose veins  . Heart disease Father   . Diabetes Father   . Hyperlipidemia Father   . Hypertension Father   . Heart attack Father   . Heart disease Brother        MI in late 24s  . Hyperlipidemia Brother   . Heart attack Brother   . Coronary artery disease Other   . Diabetes Other   . Parkinsonism Brother   . Heart failure Brother   . Hypertension Daughter     SOCIAL HISTORY: Social History   Tobacco Use  . Smoking status: Light Tobacco Smoker    Packs/day: 1.00    Years: 50.00    Pack years: 50.00    Types: E-cigarettes  . Smokeless tobacco: Never Used  . Tobacco comment: e-cigs  Substance Use Topics  . Alcohol use: No    Allergies  Allergen Reactions  . Rocephin [Ceftriaxone Sodium In Dextrose] Dermatitis and Rash    10/24 /14 blisters of palms & diffuse rash Because of a history of documented adverse serious drug  reaction;Medi Alert bracelet  is recommended  . Amoxicillin Rash    Has patient had a PCN reaction causing IMMEDIATE RASH, FACIAL/TONGUE/THROAT SWELLING, SOB, OR LIGHTHEADEDNESS WITH HYPOTENSION:  #  #  #  YES  #  #  #  Has patient had a PCN reaction causing severe rash involving mucus membranes or skin necrosis: No Has patient had a PCN reaction that required hospitalization No Has patient had a PCN reaction occurring within the last 10 years: No If all of the above answers are "NO", then may proceed with Cephalosporin use.   . Captopril Cough  . Simvastatin Other (See Comments)    MYALGIAS BUTTOCKS CRAMP  . Ciprofloxacin Other (See Comments)    Sores in mouth   . Adhesive [Tape] Rash  . Latex Rash    Patient states she gets red and a rash when latex touches her.    Current Outpatient Medications  Medication Sig Dispense Refill  . amLODipine (NORVASC) 5 MG tablet TAKE 1 TABLET BY MOUTH TWICE DAILY. 180 tablet 1  . Ascorbic Acid  (VITAMIN C) 250 MG CHEW Chew 1,000 mg by mouth daily.     . B Complex-C (SUPER B COMPLEX PO) Take 1 tablet by mouth daily.     . Blood Glucose Monitoring Suppl (ONE TOUCH ULTRA 2) w/Device KIT Use as advised 1 each 0  . Cholecalciferol (VITAMIN D3) 2000 UNITS TABS Take 2,000 Units by mouth daily.     . diazepam (VALIUM) 5 MG tablet Take 1 tablet (5 mg total) by mouth at bedtime as needed. 90 tablet 0  . fexofenadine (ALLEGRA) 180 MG tablet Take 180 mg by mouth at bedtime.     Marland Kitchen FLUoxetine (PROZAC) 10 MG capsule TAKE 1 CAPSULE DAILY. 90 capsule 1  . furosemide (LASIX) 40 MG tablet TAKE 1/2 TO 1 TABLET DAILY AS NEEDED FOR FLUID 90 tablet 0  . gabapentin (NEURONTIN) 300 MG capsule     . glipiZIDE (GLUCOTROL) 5 MG tablet     . glucose blood (ONE TOUCH ULTRA TEST) test strip CHECK BLOOD SUGAR TWICE A DAY. 180 each 1  . HYDROcodone-acetaminophen (NORCO/VICODIN) 5-325 MG tablet     . labetalol (NORMODYNE) 300 MG tablet Take 1 tablet (300 mg total) by mouth 3 (three) times daily.    . Liniments (SALONPAS PAIN RELIEF PATCH EX) Apply 1 patch topically daily as needed (back pain).    Marland Kitchen losartan (COZAAR) 100 MG tablet Take 1 tablet (100 mg total) by mouth daily. Annual appt due in January must see provider for future refills 90 tablet 0  . meloxicam (MOBIC) 7.5 MG tablet TAKE 1 TABLET ONCE DAILY. 90 tablet 1  . metFORMIN (GLUCOPHAGE) 500 MG tablet TAKE 1 TABLET BY MOUTH TWICE DAILY. 180 tablet 1  . rosuvastatin (CRESTOR) 20 MG tablet TAKE 1 TABLET ONCE DAILY. 90 tablet 1  . traMADol (ULTRAM) 50 MG tablet TAKE 1 TABLET DAILY AS NEEDED FOR SEVERE PAIN. 30 tablet 0  . Triamcinolone Acetonide (NASACORT ALLERGY 24HR NA) Place 1 spray into the nose 2 (two) times daily as needed (congestion).    . vitamin E 400 UNIT capsule Take 400 Units by mouth daily.     No current facility-administered medications for this visit.    REVIEW OF SYSTEMS:  '[X]'$  denotes positive finding, '[ ]'$  denotes negative finding Cardiac   Comments:  Chest pain or chest pressure:    Shortness of breath upon exertion:    Short of breath when lying flat:  Irregular heart rhythm:        Vascular    Pain in calf, thigh, or hip brought on by ambulation:    Pain in feet at night that wakes you up from your sleep:     Blood clot in your veins:    Leg swelling:           PHYSICAL EXAM: Vitals:   12/13/19 0852  BP: 129/73  Pulse: 83  Resp: 20  Temp: 98 F (36.7 C)  SpO2: 98%  Weight: 157 lb 4.8 oz (71.4 kg)  Height: '5\' 4"'$  (1.626 m)    GENERAL: The patient is a well-nourished female, in no acute distress. The vital signs are documented above. CARDIOVASCULAR: 2+ radial pulses bilaterally.  I do not palpate an aneurysm in her abdomen. PULMONARY: There is good air exchange  MUSCULOSKELETAL: There are no major deformities or cyanosis. NEUROLOGIC: No focal weakness or paresthesias are detected. SKIN: There are no ulcers or rashes noted. PSYCHIATRIC: The patient has a normal affect.  DATA:  Ultrasound today of her aorta reveals no change in her prior measurement of 4.9 cm.  MEDICAL ISSUES: Had long discussion with the patient and her husband.  Explained that she is approaching threshold of where elective repair would be recommended in a woman.  He is just under 5 cm.  Fortunately she has had no evidence of growth.  I have recommended continued observation and close follow-up in 6 months.  I explained that her annual risk for rupture would be predicted at 5% if she reaches 5 cm in diameter.  We will see her in 6 months with noncontrast CT due to her renal insufficiency.  Again discussed open surgery and stent graft repair.  Also discussed symptoms of leaking aneurysm and she knows to call EMS and present to [facility] should she have symptoms.    Rosetta Posner, MD FACS [facility]ascular and Vein Specialists of [facility] Tel [phone] Pager [phone]

## 2019-12-15 ENCOUNTER — Telehealth: Payer: Self-pay | Admitting: Internal Medicine

## 2019-12-15 NOTE — Chronic Care Management (AMB) (Signed)
  Chronic Care Management   Note  12/15/2019 Name: Sheila Melton MRN: [id number] DOB: 01-Jul-1941  Sheila Melton is a 79 y.o. year old female who is a primary care patient of Burns, Claudina Lick, MD. I reached out to [name] by phone today in response to a referral sent by [name]'s PCP, Binnie Rail, MD.   [name] was given information about Chronic Care Management services today including:  1. CCM service includes personalized support from designated clinical staff supervised by her physician, including individualized plan of care and coordination with other care providers 2. 24/7 contact phone numbers for assistance for urgent and routine care needs. 3. Service will only be billed when office clinical staff spend 20 minutes or more in a month to coordinate care. 4. Only one practitioner may furnish and bill the service in a calendar month. 5. The patient may stop CCM services at any time (effective at the end of the month) by phone call to the office staff. 6. The patient will be responsible for cost sharing (co-pay) of up to 20% of the service fee (after annual deductible is met).  Patient agreed to services and verbal consent obtained.   Follow up plan:  [place]

## 2019-12-19 ENCOUNTER — Other Ambulatory Visit: Payer: Self-pay | Admitting: Internal Medicine

## 2019-12-19 ENCOUNTER — Ambulatory Visit: Payer: PPO | Attending: Internal Medicine

## 2019-12-19 DIAGNOSIS — E782 Mixed hyperlipidemia: Secondary | ICD-10-CM

## 2019-12-19 DIAGNOSIS — E1159 Type 2 diabetes mellitus with other circulatory complications: Secondary | ICD-10-CM

## 2019-12-19 DIAGNOSIS — I10 Essential (primary) hypertension: Secondary | ICD-10-CM

## 2019-12-19 DIAGNOSIS — G479 Sleep disorder, unspecified: Secondary | ICD-10-CM

## 2019-12-19 DIAGNOSIS — F411 Generalized anxiety disorder: Secondary | ICD-10-CM

## 2019-12-19 DIAGNOSIS — Z23 Encounter for immunization: Secondary | ICD-10-CM | POA: Insufficient documentation

## 2019-12-19 DIAGNOSIS — F3289 Other specified depressive episodes: Secondary | ICD-10-CM

## 2019-12-19 DIAGNOSIS — N1832 Chronic kidney disease, stage 3b: Secondary | ICD-10-CM

## 2019-12-19 NOTE — Progress Notes (Signed)
   Covid-19 Vaccination Clinic  Name:  Sheila Melton    MRN: [id number] DOB: 09/20/1941  12/19/2019  [name] was observed post Covid-19 immunization for 15 minutes without incidence. She was provided with Vaccine Information Sheet and instruction to access the V-Safe system.   [name] was instructed to call 911 with any severe reactions post vaccine: Marland Kitchen Difficulty breathing  . Swelling of your face and throat  . A fast heartbeat  . A bad rash all over your body  . Dizziness and weakness    Immunizations Administered    Name Date Dose VIS Date Route   Pfizer COVID-19 Vaccine 12/19/2019  8:56 AM 0.3 mL 10/21/2019 Intramuscular   Manufacturer: [place]   Lot: [id number]   [place]: [id number]

## 2019-12-20 ENCOUNTER — Other Ambulatory Visit: Payer: Self-pay

## 2019-12-20 ENCOUNTER — Ambulatory Visit: Payer: PPO | Admitting: Pharmacist

## 2019-12-20 DIAGNOSIS — N1832 Chronic kidney disease, stage 3b: Secondary | ICD-10-CM

## 2019-12-20 DIAGNOSIS — E782 Mixed hyperlipidemia: Secondary | ICD-10-CM

## 2019-12-20 DIAGNOSIS — F3289 Other specified depressive episodes: Secondary | ICD-10-CM

## 2019-12-20 DIAGNOSIS — I10 Essential (primary) hypertension: Secondary | ICD-10-CM

## 2019-12-20 DIAGNOSIS — E1159 Type 2 diabetes mellitus with other circulatory complications: Secondary | ICD-10-CM

## 2019-12-20 NOTE — Chronic Care Management (AMB) (Signed)
Chronic Care Management Pharmacy  Name: Sheila Melton  MRN: [id number] DOB: 1941/01/20   Chief Complaint/ HPI  Sheila Melton,  79 y.o. , female presents for their Initial CCCM visit with the clinical pharmacist via telephone. Pt's husband is Sheila Melton), seen for CCM services last week.   PCP : Binnie Rail, MD  Their chronic conditions include: HTN, DMII, HLD, CKD3, depression/anxiety  Office Visits:  11/21/19 Dr Quay Burow OV: pt saw Dr Annette Stable for back pain, started hydrocodone. Pt also has tramadol - instructed to take one or the other. Dr Posey Pronto dc'd Prograf d/t elevated sugars, pt has f/u shortly.  Consult Visit: 12/13/19 Dr Donnetta Hutching (Vasc Surg): AAA follow up, no changes, continue observation.  Medications: Outpatient Encounter Medications as of 12/20/2019  Medication Sig Note  . amLODipine (NORVASC) 5 MG tablet TAKE 1 TABLET BY MOUTH TWICE DAILY.   Marland Kitchen Ascorbic Acid (VITAMIN C) 250 MG CHEW Chew 1,000 mg by mouth daily.    . Blood Glucose Monitoring Suppl (ONE TOUCH ULTRA 2) w/Device KIT Use as advised   . Cholecalciferol (VITAMIN D3) 2000 UNITS TABS Take 2,000 Units by mouth daily.    . diazepam (VALIUM) 5 MG tablet Take 1 tablet (5 mg total) by mouth at bedtime as needed.   . fexofenadine (ALLEGRA) 180 MG tablet Take 180 mg by mouth at bedtime.    Marland Kitchen FLUoxetine (PROZAC) 10 MG capsule TAKE 1 CAPSULE DAILY.   . furosemide (LASIX) 40 MG tablet TAKE 1/2 TO 1 TABLET DAILY AS NEEDED FOR FLUID (Patient taking differently: Take 40 mg by mouth daily. )   . glucose blood (ONE TOUCH ULTRA TEST) test strip CHECK BLOOD SUGAR TWICE A DAY.   Marland Kitchen HYDROcodone-acetaminophen (NORCO/VICODIN) 5-325 MG tablet Take 1 tablet by mouth 2 (two) times daily as needed.  12/20/2019: Pt takes 1 tablet daily  . labetalol (NORMODYNE) 300 MG tablet Take 1 tablet (300 mg total) by mouth 3 (three) times daily.   . Liniments (SALONPAS PAIN RELIEF PATCH EX) Apply 1 patch topically daily as needed (back pain).   Marland Kitchen losartan (COZAAR)  100 MG tablet Take 1 tablet (100 mg total) by mouth daily. Annual appt due in January must see provider for future refills   . meloxicam (MOBIC) 7.5 MG tablet TAKE 1 TABLET ONCE DAILY.   . metFORMIN (GLUCOPHAGE) 500 MG tablet TAKE 1 TABLET BY MOUTH TWICE DAILY.   . rosuvastatin (CRESTOR) 20 MG tablet TAKE 1 TABLET ONCE DAILY.   . Triamcinolone Acetonide (NASACORT ALLERGY 24HR NA) Place 1 spray into the nose 2 (two) times daily as needed (congestion).   . vitamin B-12 (CYANOCOBALAMIN) 500 MCG tablet Take 500 mcg by mouth daily.   . Vitamin E 180 MG CAPS Take 180 Units by mouth daily.    . B Complex-C (SUPER B COMPLEX PO) Take 1 tablet by mouth daily.    Marland Kitchen gabapentin (NEURONTIN) 300 MG capsule  12/20/2019: Pt could not tolerate d/t oversedation  . glipiZIDE (GLUCOTROL) 5 MG tablet Take 1 tablet in AM and 2 tablets in PM 12/20/2019: Takes 5 mg AM and 10 mg PM  . traMADol (ULTRAM) 50 MG tablet TAKE 1 TABLET DAILY AS NEEDED FOR SEVERE PAIN. (Patient not taking: Reported on 12/20/2019)    No facility-administered encounter medications on file as of 12/20/2019.     Current Diagnosis/Assessment:  Goals Addressed    . Medication safety with declining kidney function       Metformin will be discontinued if  kidney function continues to decline, follow up with nephrologist Dr Posey Pronto in 3 months for repeat labs  Meloxicam is also processed through the kidneys and should be used with caution    . Pharmacy Care Plan       Current Barriers:  . Chronic Disease Management support, education, and care coordination needs related to HTN, HLD, DMII, and CKD  Pharmacist Clinical Goal(s):  Marland Kitchen Maintain A1c < 7% . Maintain BP < 140/90 . Maintain LDL <100 . Ensure safe use of medications re: declining kidney function  Interventions: . Comprehensive medication review performed. . Discussed dangers of metformin with declining kidney function . Discussed dangers of NSAIDs (meloxicam) with declining kidney  function  Patient Self Care Activities:  . Patient verbalizes understanding of plan to continue current medications, Self administers medications as prescribed, Calls pharmacy for medication refills, and Calls provider office for new concerns or questions  Initial goal documentation        Diabetes   Recent Relevant Labs: Lab Results  Component Value Date/Time   HGBA1C 6.6 (H) 11/21/2019 02:59 PM   HGBA1C 6.5 (A) 05/18/2019 03:22 PM   HGBA1C 6.8 (H) 11/17/2018 03:03 PM   MICROALBUR 194.0 Verified by manual dilution. (H) 11/16/2014 12:05 PM   MICROALBUR 95.5 (H) 05/09/2014 11:28 AM    Checking BG: Daily  Recent FBG Readings: 30 day average - 120  Patient has failed these meds in past: Lantus, pioglitazone -pt was started on Prograf Oct 2020 and sugars climbed to 200-300s, started Lantus at that time to manage. Once Prograf was dc'd few months later, sugars went back down and taken off insulin.  Patient is currently controlled on the following medications: metformin 500 mg BID, glipizide 5 qAM and 10 mg qPM with meals  Last diabetic eye exam:  Lab Results  Component Value Date/Time   HMDIABEYEEXA Retinopathy (A) 05/19/2018 12:00 AM    Last diabetic foot exam: No results found for: HMDIABFOOTEX   We discussed: blood sugars are well controlled since stopping Prograf, pt is taking meds as directed without issues. However, if kidney function continues to decline metformin will need to be discontinued. Pt has f/u appt with Dr Posey Pronto in a few months with labs.  Plan  Continue current medications and control with diet and exercise Follow up with Dr Posey Pronto in 3 mos  Hypertension   BP today is:  <130/80  Office blood pressures are  BP Readings from Last 3 Encounters:  12/13/19 129/73  11/21/19 132/70  08/24/19 (!) 150/68    Patient has failed these meds in the past: valsartan, spironolactone  Patient is currently controlled on the following medications: amlodipine 5 mg  BID, losartan 100 mg daily, labetalol 300 mg TID, furosemide 40 mg PRN  Patient checks BP at home 3-5x per week  Patient home BP readings are ranging: 120s-30s/60s-70s  We discussed: pt reports she has taken medication for BP since she was 30, long history of HTN may be contributing to her current kidney issues. Pt reports compliance with meds as prescribed.  Plan  Continue current medications and control with diet and exercise    Hyperlipidemia   Lipid Panel     Component Value Date/Time   CHOL 121 11/21/2019 1459   TRIG 294.0 (H) 11/21/2019 1459   HDL 39.30 11/21/2019 1459   CHOLHDL 3 11/21/2019 1459   VLDL 58.8 (H) 11/21/2019 1459   LDLDIRECT 43.0 11/21/2019 1459    Patient has failed these meds in past: pravastatin Patient is  currently controlled on the following medications: rosuvastatin 20 mg daily  We discussed: compliance with statin. Pt has issues with triglycerides that may be related to recent high BG, may equivocate now that BG is better controlled. Trigs are below threshold to treat (>500) currently.   Plan  Continue current medications and control with diet and exercise    CKD stage 3   Lab Results  Component Value Date   CREATININE 2.02 (H) 11/21/2019   CREATININE 1.56 (H) 11/17/2018   CREATININE 1.56 (H) 05/19/2018   We discussed: pt follows with Dr Posey Pronto, last seen 2 weeks ago per pt. Pt reports she was told kidneys were "a little worse" but not med changes were made, set to f/u in another 3 months. Pt was told there is nothing she can do to help her kidney issues.  Plan: Continue current medications Follow up in 3 months with Dr Posey Pronto Consider d/c metformin and meloxicam if kidney function continues to decline   Depression/anxiety/insomnia    Patient has failed these meds in past: n/a Patient is currently controlled on the following medications: fluoxetine 10 mg daily, diazepam 5 mg HS prn  We discussed:  Pt has been on diazepam since her  daughter was a baby to help her sleep (~50 years). She reports she sleeps "really well" now. She is not currently interested in stopping it.  She started fluoxetine about a year ago to help her deal with a stressful family issue, and it helped. She feels well now, discussed possibility of trial off fluoxetine, pt will think about it.   Plan  Continue current medications Address necessity of medications at follow up  Chronic back pain   Patient has failed these meds in past: gabapentin (oversedation), tramadol  Patient is currently uncontrolled on the following medications: hydrocodone/APAP 5-325 mg, meloxicam 7.5 mg daily,   We discussed: pt reports she needs back surgery but cannot get it right now due to AAA. She reports hydrocodone does not help much, but she takes 1 daily. She thinks meloxicam helps a little because she notices pain is worse when she does not take it. Discussed effects of meloxicam on kidney function, pt voiced understanding and would like to continue taking it since it is the only thing that helps with her pain.  Plan  Continue current medications  Continue to monitor kidney function with NSAID use  Health maintenance   Patient is controlled on: Vitamin C 1000 mg daily, Vitamin B12 500 mcg daily, Vitamin D 2000 IU, Vitamin E 400 unit, fexofenadine 180 mg HS  We discussed: Dr Posey Pronto recommended 1000 mg of Vitamin C to help with immune system function. Other vitamins are patient preference.  Plan  Continue current medications   Medication Management   Pt uses a 7-day pill box that she sets up each week.  Pt uses [facility] for all medications and appreciates their service.  Pt endorses compliance with medications as prescribed, she rarely misses doses  We discussed: benefits of [facility] pharmacy services. Pt does not want to change pharmacies now but will consider in future when meds become harder for her to keep up with.  Plan: Continue medication  management    Follow up: phone visit in 3 months   Charlene Brooke, PharmD Clinical Pharmacist [facility] [phone]

## 2019-12-20 NOTE — Patient Instructions (Addendum)
Visit Information  Thank you for meeting with me to discuss your medications! I look forward to working with you to achieve your health care goals. Below is a summary of what we talked about during the visit:   Goals Addressed    . Medication safety with declining kidney function       Metformin will be discontinued if kidney function continues to decline, follow up with nephrologist Dr Posey Pronto in 3 months for repeat labs  Meloxicam is also processed through the kidneys and should be used with caution    . Pharmacy Care Plan       Current Barriers:  . Chronic Disease Management support, education, and care coordination needs related to HTN, HLD, DMII, and CKD  Pharmacist Clinical Goal(s):  Marland Kitchen Maintain A1c < 7% . Maintain BP < 140/90 . Maintain LDL <100 . Ensure safe use of medications re: declining kidney function  Interventions: . Comprehensive medication review performed. . Discussed dangers of metformin with declining kidney function . Discussed dangers of NSAIDs (meloxicam) with declining kidney function  Patient Self Care Activities:  . Patient verbalizes understanding of plan to continue current medications, Self administers medications as prescribed, Calls pharmacy for medication refills, and Calls provider office for new concerns or questions  Initial goal documentation     Sheila Melton was given information about Chronic Care Management services today including:  1. CCM service includes personalized support from designated clinical staff supervised by her physician, including individualized plan of care and coordination with other care providers 2. 24/7 contact phone numbers for assistance for urgent and routine care needs. 3. Service will only be billed when office clinical staff spend 20 minutes or more in a month to coordinate care. 4. Only one practitioner may furnish and bill the service in a calendar month. 5. The patient may stop CCM services at any time (effective at  the end of the month) by phone call to the office staff. 6. The patient will be responsible for cost sharing (co-pay) of up to 20% of the service fee (after annual deductible is met).  Patient agreed to services and verbal consent obtained.   Print copy of patient instructions provided.  Telephone follow up appointment with pharmacy team member scheduled for: 3 months  Charlene Brooke, PharmD Clinical Pharmacist [facility] at [facility] [phone]

## 2019-12-22 ENCOUNTER — Other Ambulatory Visit: Payer: Self-pay | Admitting: Internal Medicine

## 2019-12-29 ENCOUNTER — Other Ambulatory Visit: Payer: Self-pay | Admitting: Internal Medicine

## 2020-01-02 ENCOUNTER — Other Ambulatory Visit: Payer: Self-pay | Admitting: Internal Medicine

## 2020-01-02 NOTE — Telephone Encounter (Signed)
Last OV 11/21/19 Next OV 05/21/20 Last RF 10/07/19

## 2020-01-04 DIAGNOSIS — Z6826 Body mass index (BMI) 26.0-26.9, adult: Secondary | ICD-10-CM | POA: Diagnosis not present

## 2020-01-04 DIAGNOSIS — M4310 Spondylolisthesis, site unspecified: Secondary | ICD-10-CM | POA: Diagnosis not present

## 2020-01-04 DIAGNOSIS — Z981 Arthrodesis status: Secondary | ICD-10-CM | POA: Diagnosis not present

## 2020-01-04 DIAGNOSIS — I10 Essential (primary) hypertension: Secondary | ICD-10-CM | POA: Diagnosis not present

## 2020-01-24 DIAGNOSIS — N184 Chronic kidney disease, stage 4 (severe): Secondary | ICD-10-CM | POA: Diagnosis not present

## 2020-02-22 DIAGNOSIS — Z981 Arthrodesis status: Secondary | ICD-10-CM | POA: Diagnosis not present

## 2020-02-22 DIAGNOSIS — M47817 Spondylosis without myelopathy or radiculopathy, lumbosacral region: Secondary | ICD-10-CM | POA: Diagnosis not present

## 2020-03-08 DIAGNOSIS — N184 Chronic kidney disease, stage 4 (severe): Secondary | ICD-10-CM | POA: Diagnosis not present

## 2020-03-15 DIAGNOSIS — N2581 Secondary hyperparathyroidism of renal origin: Secondary | ICD-10-CM | POA: Diagnosis not present

## 2020-03-15 DIAGNOSIS — I129 Hypertensive chronic kidney disease with stage 1 through stage 4 chronic kidney disease, or unspecified chronic kidney disease: Secondary | ICD-10-CM | POA: Diagnosis not present

## 2020-03-15 DIAGNOSIS — D631 Anemia in chronic kidney disease: Secondary | ICD-10-CM | POA: Diagnosis not present

## 2020-03-15 DIAGNOSIS — N184 Chronic kidney disease, stage 4 (severe): Secondary | ICD-10-CM | POA: Diagnosis not present

## 2020-03-21 DIAGNOSIS — L578 Other skin changes due to chronic exposure to nonionizing radiation: Secondary | ICD-10-CM | POA: Diagnosis not present

## 2020-03-21 DIAGNOSIS — D225 Melanocytic nevi of trunk: Secondary | ICD-10-CM | POA: Diagnosis not present

## 2020-03-21 DIAGNOSIS — Z85828 Personal history of other malignant neoplasm of skin: Secondary | ICD-10-CM | POA: Diagnosis not present

## 2020-03-21 DIAGNOSIS — L821 Other seborrheic keratosis: Secondary | ICD-10-CM | POA: Diagnosis not present

## 2020-03-21 DIAGNOSIS — L814 Other melanin hyperpigmentation: Secondary | ICD-10-CM | POA: Diagnosis not present

## 2020-03-21 DIAGNOSIS — L820 Inflamed seborrheic keratosis: Secondary | ICD-10-CM | POA: Diagnosis not present

## 2020-03-29 DIAGNOSIS — G894 Chronic pain syndrome: Secondary | ICD-10-CM | POA: Diagnosis not present

## 2020-03-29 DIAGNOSIS — M5416 Radiculopathy, lumbar region: Secondary | ICD-10-CM | POA: Diagnosis not present

## 2020-04-02 ENCOUNTER — Other Ambulatory Visit: Payer: Self-pay | Admitting: Internal Medicine

## 2020-04-03 ENCOUNTER — Other Ambulatory Visit: Payer: Self-pay | Admitting: Internal Medicine

## 2020-04-04 ENCOUNTER — Other Ambulatory Visit: Payer: Self-pay

## 2020-04-04 ENCOUNTER — Other Ambulatory Visit: Payer: Self-pay | Admitting: Internal Medicine

## 2020-04-04 MED ORDER — DIAZEPAM 5 MG PO TABS: 5.0000 mg | ORAL_TABLET | Freq: Every evening | ORAL | 0 refills | Status: DC | PRN

## 2020-04-04 NOTE — Telephone Encounter (Signed)
Will you please resend diazepam to pharmacy. I accidentally sent it yesterday but it printed. Thanks.    Last OV 11/21/19 Next OV 05/21/20 Last RF 01/06/20

## 2020-04-10 ENCOUNTER — Ambulatory Visit: Payer: PPO | Admitting: Pharmacist

## 2020-04-10 ENCOUNTER — Other Ambulatory Visit: Payer: Self-pay

## 2020-04-10 DIAGNOSIS — I10 Essential (primary) hypertension: Secondary | ICD-10-CM

## 2020-04-10 DIAGNOSIS — E1159 Type 2 diabetes mellitus with other circulatory complications: Secondary | ICD-10-CM

## 2020-04-10 DIAGNOSIS — E782 Mixed hyperlipidemia: Secondary | ICD-10-CM

## 2020-04-10 NOTE — Chronic Care Management (AMB) (Signed)
Chronic Care Management Pharmacy  Name: ANAIYA WISINSKI  MRN: [id number] DOB: 30-Jul-1941   Chief Complaint/ HPI  [name],  79 y.o. , female presents for their Follow-Up CCCM visit with the clinical pharmacist via telephone. Pt's husband is [name], also enrolled in CCM services.  PCP : Binnie Rail, MD  Their chronic conditions include: HTN, DMII, HLD, CKD3, depression/anxiety  Office Visits:  11/21/19 Dr Quay Burow OV: pt saw Dr Annette Stable for back pain, started hydrocodone. Pt also has tramadol - instructed to take one or the other. Dr Posey Pronto dc'd Prograf d/t elevated sugars, pt has f/u shortly.  Consult Visit: 03/29/20 Dr Carlis Abbott (pain mgmt): psych evaluation for spinal cord stimulation surgery; cleared for SCS.  12/13/19 Dr Early (Vasc Surg): AAA follow up, no changes, continue observation.  Medications: Outpatient Encounter Medications as of 04/10/2020  Medication Sig Note  . amLODipine (NORVASC) 5 MG tablet TAKE 1 TABLET BY MOUTH TWICE DAILY.   Marland Kitchen Ascorbic Acid (VITAMIN C) 250 MG CHEW Chew 1,000 mg by mouth daily.    . B Complex-C (SUPER B COMPLEX PO) Take 1 tablet by mouth daily.    . Blood Glucose Monitoring Suppl (ONE TOUCH ULTRA 2) w/Device KIT Use as advised   . Cholecalciferol (VITAMIN D3) 2000 UNITS TABS Take 2,000 Units by mouth daily.    . diazepam (VALIUM) 5 MG tablet Take 1 tablet (5 mg total) by mouth at bedtime as needed.   . fexofenadine (ALLEGRA) 180 MG tablet Take 180 mg by mouth at bedtime.    Marland Kitchen FLUoxetine (PROZAC) 10 MG capsule TAKE 1 CAPSULE DAILY.   . furosemide (LASIX) 40 MG tablet TAKE 1/2 TO 1 TABLET DAILY AS NEEDED FOR FLUID   . glipiZIDE (GLUCOTROL) 5 MG tablet TAKE 1 TABLET IN THE MORNING AND 2 TABLETS IN THE EVENING.   Marland Kitchen HYDROcodone-acetaminophen (NORCO/VICODIN) 5-325 MG tablet Take 1 tablet by mouth 2 (two) times daily as needed.  12/20/2019: Pt takes 1 tablet daily  . labetalol (NORMODYNE) 300 MG tablet Take 1 tablet (300 mg total) by mouth 3 (three) times  daily.   . Liniments (SALONPAS PAIN RELIEF PATCH EX) Apply 1 patch topically daily as needed (back pain).   Marland Kitchen losartan (COZAAR) 100 MG tablet TAKE 1 TABLET ONCE DAILY.   . meloxicam (MOBIC) 7.5 MG tablet TAKE 1 TABLET ONCE DAILY.   . metFORMIN (GLUCOPHAGE) 500 MG tablet TAKE 1 TABLET BY MOUTH TWICE DAILY.   Marland Kitchen ONETOUCH ULTRA test strip CHECK BLOOD SUGAR TWICE DAILY.   . rosuvastatin (CRESTOR) 20 MG tablet TAKE 1 TABLET ONCE DAILY.   . traMADol (ULTRAM) 50 MG tablet TAKE 1 TABLET DAILY AS NEEDED FOR SEVERE PAIN.   . Triamcinolone Acetonide (NASACORT ALLERGY 24HR NA) Place 1 spray into the nose 2 (two) times daily as needed (congestion).   . vitamin B-12 (CYANOCOBALAMIN) 500 MCG tablet Take 500 mcg by mouth daily.   . Vitamin E 180 MG CAPS Take 180 Units by mouth daily.    Marland Kitchen gabapentin (NEURONTIN) 300 MG capsule  12/20/2019: Pt could not tolerate d/t oversedation  . [DISCONTINUED] amLODipine (NORVASC) 5 MG tablet TAKE 1 TABLET BY MOUTH TWICE DAILY.   . [DISCONTINUED] diazepam (VALIUM) 5 MG tablet Take 1 tablet (5 mg total) by mouth at bedtime as needed.   . [DISCONTINUED] FLUoxetine (PROZAC) 10 MG capsule TAKE 1 CAPSULE DAILY.   . [DISCONTINUED] furosemide (LASIX) 40 MG tablet TAKE 1/2 TO 1 TABLET DAILY AS NEEDED FOR FLUID (Patient  taking differently: Take 40 mg by mouth daily. )   . [DISCONTINUED] glipiZIDE (GLUCOTROL) 5 MG tablet Take 1 tablet in AM and 2 tablets in PM 12/20/2019: Takes 5 mg AM and 10 mg PM  . [DISCONTINUED] glucose blood (ONE TOUCH ULTRA TEST) test strip CHECK BLOOD SUGAR TWICE A DAY.   . [DISCONTINUED] losartan (COZAAR) 100 MG tablet Take 1 tablet (100 mg total) by mouth daily. Annual appt due in January must see provider for future refills   . [DISCONTINUED] meloxicam (MOBIC) 7.5 MG tablet TAKE 1 TABLET ONCE DAILY.   . [DISCONTINUED] metFORMIN (GLUCOPHAGE) 500 MG tablet TAKE 1 TABLET BY MOUTH TWICE DAILY.   . [DISCONTINUED] rosuvastatin (CRESTOR) 20 MG tablet TAKE 1 TABLET ONCE  DAILY.    No facility-administered encounter medications on file as of 04/10/2020.     Current Diagnosis/Assessment:  Goals Addressed            This Visit's Progress   . Pharmacy Care Plan       CARE PLAN ENTRY  Current Barriers:  . Chronic Disease Management support, education, and care coordination needs related to Hypertension, Hyperlipidemia, and Diabetes   Hypertension BP Readings from Last 3 Encounters:  12/13/19 129/73  11/21/19 132/70  08/24/19 (!) 150/68 .  Pharmacist Clinical Goal(s): o Over the next 30 days, patient will work with PharmD and providers to maintain BP goal <130/80 . Current regimen:  o amlodipine 5 mg BID,  o losartan 100 mg daily,  o labetalol 300 mg TID,  o furosemide 40 mg PRN . Interventions: o Discussed BP goal and benefits of medication for kidney protection and prevention of heart attack/stroke . Patient self care activities - Over the next 30 days, patient will: o Check BP 3-5 times per week, document, and provide at future appointments o Ensure daily salt intake < 2300 mg/day  Hyperlipidemia Lipid Panel     Component Value Date/Time   CHOL 121 11/21/2019 1459   TRIG 294.0 (H) 11/21/2019 1459   HDL 39.30 11/21/2019 1459   LDLDIRECT 43.0 11/21/2019 1459 .  Pharmacist Clinical Goal(s): o Over the next 30 days, patient will work with PharmD and providers to maintain LDL goal < 70 . Current regimen:  o Rosuvastatin 20 mg daily . Interventions: o Discussed benefits of statin for prevention of heart attack and stroke . Patient self care activities - Over the next 30 days, patient will: o Continue medication as prescribed o Continue low cholesterol diet  Diabetes Lab Results  Component Value Date/Time   HGBA1C 6.6 (H) 11/21/2019 02:59 PM   HGBA1C 6.5 (A) 05/18/2019 03:22 PM   HGBA1C 6.8 (H) 11/17/2018 03:03 PM .  Pharmacist Clinical Goal(s): o Over the next 30 days, patient will work with PharmD and providers to maintain A1c  goal <7% . Current regimen:  o Metformin 500 mg twice a day o Glipizide 5 mg in AM and 10 mg in PM with meals . Interventions: o Discussed metformin should be discontinued when GFR is less than 30 due to inability to clear the drug from the body appropriately, which increases risk for side effects o Recommend to stop metformin and continue checking sugar in the morning . Patient self care activities - Over the next 30 days, patient will: o Check blood sugar once daily and in the morning before eating or drinking, document, and provide at future appointments o Contact provider with any episodes of hypoglycemia o Stop metformin pending PCP approval  Medication management . Pharmacist  Clinical Goal(s): o Over the next 30 days, patient will work with PharmD and providers to maintain optimal medication adherence . Current pharmacy: [facility] . Interventions o Comprehensive medication review performed. o Continue current medication management strategy . Patient self care activities - Over the next 30 days, patient will: o Focus on medication adherence by pill box o Take medications as prescribed o Report any questions or concerns to PharmD and/or provider(s)  Please see past updates related to this goal by clicking on the "Past Updates" button in the selected goal         Diabetes   Recent Relevant Labs: Lab Results  Component Value Date/Time   HGBA1C 6.6 (H) 11/21/2019 02:59 PM   HGBA1C 6.5 (A) 05/18/2019 03:22 PM   HGBA1C 6.8 (H) 11/17/2018 03:03 PM   MICROALBUR 194.0 Verified by manual dilution. (H) 11/16/2014 12:05 PM   MICROALBUR 95.5 (H) 05/09/2014 11:28 AM    Kidney Function Lab Results  Component Value Date/Time   CREATININE 2.02 (H) 11/21/2019 02:59 PM   CREATININE 1.56 (H) 11/17/2018 03:03 PM   GFR 23.89 (L) 11/21/2019 02:59 PM   Checking BG: Daily  Recent FBG Readings: 120 today; 7-day AVG 114, 14-day AVG 119  Patient has failed these meds in past:  Lantus, pioglitazone -pt was started on Prograf Oct 2020 and sugars climbed to 200-300s, started Lantus at that time to manage. Once Prograf was dc'd few months later, sugars went back down and taken off insulin.  Patient is currently controlled on the following medications:   metformin 500 mg BID,   glipizide 5 qAM and 10 mg qPM with meals  Last diabetic eye exam:  Lab Results  Component Value Date/Time   HMDIABEYEEXA Retinopathy (A) 05/19/2018 12:00 AM    Last diabetic foot exam: No results found for: HMDIABFOOTEX   We discussed: metformin is contraindicated with GFR < 30, pt reports she went to Dr Posey Pronto (nephrologist) in May and she has lab results printed, she reports GFR was 21 on 03/15/2020. She reports no medication changes were made. 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 including increased risk lactic acidosis; BG may increase without metformin, pt will monitor and may increase glipizide at follow up if needed (max 20 mg/day)  Plan  Recommend to discontinue metformin due to GFR < 30 x 2 Monitor BG once daily F/U 1 week to assess whether glipizide needs to increase  Hypertension   BP goal is:  <130/80  Office blood pressures are  BP Readings from Last 3 Encounters:  12/13/19 129/73  11/21/19 132/70  08/24/19 (!) 150/68   Patient has failed these meds in the past: valsartan, spironolactone Patient is currently controlled on the following medications:   amlodipine 5 mg BID,   losartan 100 mg daily,   labetalol 300 mg TID,   furosemide 40 mg PRN  Patient checks BP at home 3-5x per week  Patient home BP readings are ranging: n/a  We discussed: pt reports she has taken medication for BP since she was 30, long history of HTN may be contributing to her current kidney issues. Pt reports compliance with meds as prescribed.  Plan  Continue current medications and control with diet and exercise    Hyperlipidemia   Lipid Panel     Component Value Date/Time    CHOL 121 11/21/2019 1459   TRIG 294.0 (H) 11/21/2019 1459   HDL 39.30 11/21/2019 1459   CHOLHDL 3 11/21/2019 1459   VLDL 58.8 (H)  11/21/2019 1459   LDLDIRECT 43.0 11/21/2019 1459   The ASCVD Risk score Mikey Bussing DC Jr., et al., 2013) failed to calculate for the following reasons:   The valid total cholesterol range is 130 to 320 mg/dL   Patient has failed these meds in past: pravastatin Patient is currently controlled on the following medications:   rosuvastatin 20 mg daily  We discussed: compliance with statin. Pt has issues with triglycerides that may be related to recent high BG, may equivocate now that BG is better controlled. Trigs are below threshold to treat (>500) currently.   Plan  Continue current medications and control with diet and exercise   Depression/anxiety/insomnia   Depression screen [facility] 2/9 11/17/2018 06/25/2017 11/19/2016  Decreased Interest 0 0 0  Down, Depressed, Hopeless 0 1 0  PHQ - 2 Score 0 1 0  Altered sleeping - 0 -  Tired, decreased energy - 1 -  Change in appetite - 0 -  Feeling bad or failure about yourself  - 0 -  Trouble concentrating - 0 -  Moving slowly or fidgety/restless - 0 -  Suicidal thoughts - 0 -  PHQ-9 Score - 2 -  Some recent data might be hidden   Patient has failed these meds in past: n/a Patient is currently controlled on the following medications:   fluoxetine 10 mg daily,   diazepam 5 mg HS prn  We discussed:  Pt has been on diazepam since her daughter was a baby to help her sleep (~50 years). She reports she sleeps "really well" now. She is not currently interested in stopping it.  She started fluoxetine about a year ago to help her deal with a stressful family issue, and it helped. She feels well now, discussed possibility of trial off fluoxetine, pt will think about it.   Plan  Continue current medications Address necessity of medications at follow up  Chronic back pain   Patient has failed these meds in past:  gabapentin (oversedation), tramadol  Patient is currently uncontrolled on the following medications:   Oxycodone-APAP 5-325 mg BID  meloxicam 7.5 mg daily,   We discussed: pt reports back pain has been worsening over last several months; she is getting spinal cord stimulation implant this week. Discussed safety issues with oxycodone; pt does not take more than 2 in a day.  Plan  Continue current medications  Continue to monitor kidney function with NSAID use   Osteopenia / Osteoporosis  12/18/16: radius -2.9, RFN -2.1 LFN -1.9 spine 0.2  Last DEXA Scan: 12/18/2016   T-Score femoral neck: -2.1  T-Score total hip: n/a  T-Score lumbar spine: 0.2  T-Score forearm radius: -2.9  10-year probability of major osteoporotic fracture: 14%  10-year probability of hip fracture: 4.0%  Vit D, 25-Hydroxy  Date Value Ref Range Status  10/11/2010 87 30 - 89 ng/mL Final    Patient is a candidate for pharmacologic treatment due to T-Score -1.0 to -2.5 and 10-year risk of hip fracture > 3%  Patient has failed these meds in past: n/a Patient is currently uncontrolled on the following medications:   Vitamin D 2000 IU  We discussed:  Recommend [phone] units of vitamin D daily. Recommend 1200 mg of calcium daily from dietary and supplemental sources.; pt may benefit from Prolia injections, may consider discussing after spinal procedure  Plan  Continue current medications  Consider Prolia  Health maintenance   Patient is controlled on:   Vitamin C 1000 mg daily,   Vitamin B12 500  mcg daily,   Vitamin E 400 unit,   fexofenadine 180 mg HS  We discussed: Dr Posey Pronto recommended 1000 mg of Vitamin C to help with immune system function. Other vitamins are patient preference.  Plan  Continue current medications   Medication Management   Pt uses [facility] for all medications Uses pill box? Yes Pt endorses 100% compliance  We discussed:  benefits of [facility] services. Pt  does not want to change pharmacies now but will consider in future when meds become harder for her to keep up with.  Plan  Continue current medication management strategy    Follow up: 1 week phone visit  Charlene Brooke, PharmD Clinical Pharmacist [facility] at [facility] [phone]

## 2020-04-10 NOTE — Patient Instructions (Addendum)
Visit Information  Goals Addressed            This Visit's Progress   . Pharmacy Care Plan       CARE PLAN ENTRY  Current Barriers:  . Chronic Disease Management support, education, and care coordination needs related to Hypertension, Hyperlipidemia, and Diabetes   Hypertension BP Readings from Last 3 Encounters:  12/13/19 129/73  11/21/19 132/70  08/24/19 (!) 150/68 .  Pharmacist Clinical Goal(s): o Over the next 30 days, patient will work with PharmD and providers to maintain BP goal <130/80 . Current regimen:  o amlodipine 5 mg BID,  o losartan 100 mg daily,  o labetalol 300 mg TID,  o furosemide 40 mg PRN . Interventions: o Discussed BP goal and benefits of medication for kidney protection and prevention of heart attack/stroke . Patient self care activities - Over the next 30 days, patient will: o Check BP 3-5 times per week, document, and provide at future appointments o Ensure daily salt intake < 2300 mg/day  Hyperlipidemia Lipid Panel     Component Value Date/Time   CHOL 121 11/21/2019 1459   TRIG 294.0 (H) 11/21/2019 1459   HDL 39.30 11/21/2019 1459   LDLDIRECT 43.0 11/21/2019 1459 .  Pharmacist Clinical Goal(s): o Over the next 30 days, patient will work with PharmD and providers to maintain LDL goal < 70 . Current regimen:  o Rosuvastatin 20 mg daily . Interventions: o Discussed benefits of statin for prevention of heart attack and stroke . Patient self care activities - Over the next 30 days, patient will: o Continue medication as prescribed o Continue low cholesterol diet  Diabetes Lab Results  Component Value Date/Time   HGBA1C 6.6 (H) 11/21/2019 02:59 PM   HGBA1C 6.5 (A) 05/18/2019 03:22 PM   HGBA1C 6.8 (H) 11/17/2018 03:03 PM .  Pharmacist Clinical Goal(s): o Over the next 30 days, patient will work with PharmD and providers to maintain A1c goal <7% . Current regimen:  o Metformin 500 mg twice a day o Glipizide 5 mg in AM and 10 mg in PM with  meals . Interventions: o Discussed metformin should be discontinued when GFR is less than 30 due to inability to clear the drug from the body appropriately, which increases risk for side effects o Recommend to stop metformin and continue checking sugar in the morning . Patient self care activities - Over the next 30 days, patient will: o Check blood sugar once daily and in the morning before eating or drinking, document, and provide at future appointments o Contact provider with any episodes of hypoglycemia o Stop metformin pending PCP approval  Medication management . Pharmacist Clinical Goal(s): o Over the next 30 days, patient will work with PharmD and providers to maintain optimal medication adherence . Current pharmacy: [facility] . Interventions o Comprehensive medication review performed. o Continue current medication management strategy . Patient self care activities - Over the next 30 days, patient will: o Focus on medication adherence by pill box o Take medications as prescribed o Report any questions or concerns to PharmD and/or provider(s)  Please see past updates related to this goal by clicking on the "Past Updates" button in the selected goal       The patient verbalized understanding of instructions provided today and declined a print copy of patient instruction materials.   Telephone follow up appointment with pharmacy team member scheduled for: 1 week  Charlene Brooke, PharmD Clinical Pharmacist [facility] at [facility]  [phone]  Diabetes Mellitus and Nutrition, Adult When you have diabetes (diabetes mellitus), it is very important to have healthy eating habits because your blood sugar (glucose) levels are greatly affected by what you eat and drink. Eating healthy foods in the appropriate amounts, at about the same times every day, can help you:  Control your blood glucose.  Lower your risk of heart disease.  Improve your blood  pressure.  Reach or maintain a healthy weight. Every person with diabetes is different, and each person has different needs for a meal plan. Your health care provider may recommend that you work with a diet and nutrition specialist (dietitian) to make a meal plan that is best for you. Your meal plan may vary depending on factors such as:  The calories you need.  The medicines you take.  Your weight.  Your blood glucose, blood pressure, and cholesterol levels.  Your activity level.  Other health conditions you have, such as heart or kidney disease. How do carbohydrates affect me? Carbohydrates, also called carbs, affect your blood glucose level more than any other type of food. Eating carbs naturally raises the amount of glucose in your blood. Carb counting is a method for keeping track of how many carbs you eat. Counting carbs is important to keep your blood glucose at a healthy level, especially if you use insulin or take certain oral diabetes medicines. It is important to know how many carbs you can safely have in each meal. This is different for every person. Your dietitian can help you calculate how many carbs you should have at each meal and for each snack. Foods that contain carbs include:  Bread, cereal, rice, pasta, and crackers.  Potatoes and corn.  Peas, beans, and lentils.  Milk and yogurt.  Fruit and juice.  Desserts, such as cakes, cookies, ice cream, and candy. How does alcohol affect me? Alcohol can cause a sudden decrease in blood glucose (hypoglycemia), especially if you use insulin or take certain oral diabetes medicines. Hypoglycemia can be a life-threatening condition. Symptoms of hypoglycemia (sleepiness, dizziness, and confusion) are similar to symptoms of having too much alcohol. If your health care provider says that alcohol is safe for you, follow these guidelines:  Limit alcohol intake to no more than 1 drink per day for nonpregnant women and 2 drinks per  day for men. One drink equals 12 oz of beer, 5 oz of wine, or 1 oz of hard liquor.  Do not drink on an empty stomach.  Keep yourself hydrated with water, diet soda, or unsweetened iced tea.  Keep in mind that regular soda, juice, and other mixers may contain a lot of sugar and must be counted as carbs. What are tips for following this plan?  Reading food labels  Start by checking the serving size on the "Nutrition Facts" label of packaged foods and drinks. The amount of calories, carbs, fats, and other nutrients listed on the label is based on one serving of the item. Many items contain more than one serving per package.  Check the total grams (g) of carbs in one serving. You can calculate the number of servings of carbs in one serving by dividing the total carbs by 15. For example, if a food has 30 g of total carbs, it would be equal to 2 servings of carbs.  Check the number of grams (g) of saturated and trans fats in one serving. Choose foods that have low or no amount of these fats.  Check  the number of milligrams (mg) of salt (sodium) in one serving. Most people should limit total sodium intake to less than 2,300 mg per day.  Always check the nutrition information of foods labeled as "low-fat" or "nonfat". These foods may be higher in added sugar or refined carbs and should be avoided.  Talk to your dietitian to identify your daily goals for nutrients listed on the label. Shopping  Avoid buying canned, premade, or processed foods. These foods tend to be high in fat, sodium, and added sugar.  Shop around the outside edge of the grocery store. This includes fresh fruits and vegetables, bulk grains, fresh meats, and fresh dairy. Cooking  Use low-heat cooking methods, such as baking, instead of high-heat cooking methods like deep frying.  Cook using healthy oils, such as olive, canola, or sunflower oil.  Avoid cooking with butter, cream, or high-fat meats. Meal planning  Eat  meals and snacks regularly, preferably at the same times every day. Avoid going long periods of time without eating.  Eat foods high in fiber, such as fresh fruits, vegetables, beans, and whole grains. Talk to your dietitian about how many servings of carbs you can eat at each meal.  Eat 4-6 ounces (oz) of lean protein each day, such as lean meat, chicken, fish, eggs, or tofu. One oz of lean protein is equal to: ? 1 oz of meat, chicken, or fish. ? 1 egg. ?  cup of tofu.  Eat some foods each day that contain healthy fats, such as avocado, nuts, seeds, and fish. Lifestyle  Check your blood glucose regularly.  Exercise regularly as told by your health care provider. This may include: ? 150 minutes of moderate-intensity or vigorous-intensity exercise each week. This could be brisk walking, biking, or water aerobics. ? Stretching and doing strength exercises, such as yoga or weightlifting, at least 2 times a week.  Take medicines as told by your health care provider.  Do not use any products that contain nicotine or tobacco, such as cigarettes and e-cigarettes. If you need help quitting, ask your health care provider.  Work with a Social worker or diabetes educator to identify strategies to manage stress and any emotional and social challenges. Questions to ask a health care provider  Do I need to meet with a diabetes educator?  Do I need to meet with a dietitian?  What number can I call if I have questions?  When are the best times to check my blood glucose? Where to find more information:  American Diabetes Association: diabetes.org  Academy of Nutrition and Dietetics: www.eatright.[employer] of Diabetes and Digestive and Kidney Diseases (NIH): [employer] Summary  A healthy meal plan will help you control your blood glucose and maintain a healthy lifestyle.  Working with a diet and nutrition specialist (dietitian) can help you make a meal plan that is best for  you.  Keep in mind that carbohydrates (carbs) and alcohol have immediate effects on your blood glucose levels. It is important to count carbs and to use alcohol carefully. This information is not intended to replace advice given to you by your health care provider. Make sure you discuss any questions you have with your health care provider. Document Revised: 10/09/2017 Document Reviewed: 12/01/2016 Elsevier Patient Education  2020 [employer].

## 2020-04-12 DIAGNOSIS — M961 Postlaminectomy syndrome, not elsewhere classified: Secondary | ICD-10-CM | POA: Diagnosis not present

## 2020-04-12 DIAGNOSIS — G894 Chronic pain syndrome: Secondary | ICD-10-CM | POA: Diagnosis not present

## 2020-04-17 ENCOUNTER — Telehealth: Payer: PPO

## 2020-04-19 ENCOUNTER — Ambulatory Visit: Payer: PPO | Admitting: Pharmacist

## 2020-04-19 ENCOUNTER — Other Ambulatory Visit: Payer: Self-pay

## 2020-04-19 DIAGNOSIS — M961 Postlaminectomy syndrome, not elsewhere classified: Secondary | ICD-10-CM | POA: Diagnosis not present

## 2020-04-19 DIAGNOSIS — M5416 Radiculopathy, lumbar region: Secondary | ICD-10-CM | POA: Diagnosis not present

## 2020-04-19 DIAGNOSIS — I10 Essential (primary) hypertension: Secondary | ICD-10-CM

## 2020-04-19 DIAGNOSIS — M47817 Spondylosis without myelopathy or radiculopathy, lumbosacral region: Secondary | ICD-10-CM | POA: Diagnosis not present

## 2020-04-19 DIAGNOSIS — E1159 Type 2 diabetes mellitus with other circulatory complications: Secondary | ICD-10-CM

## 2020-04-19 NOTE — Chronic Care Management (AMB) (Signed)
Chronic Care Management Pharmacy  Name: Sheila Melton  MRN: [id number] DOB: 05-Feb-1941   Chief Complaint/ HPI  Sheila Melton,  79 y.o. , female presents for their Follow-Up CCCM visit with the clinical pharmacist via telephone. Pt's husband is [name], also enrolled in CCM services.  PCP : Binnie Rail, MD  Their chronic conditions include: HTN, DMII, HLD, CKD3, depression/anxiety  Office Visits:  11/21/19 Dr Quay Burow OV: pt saw Dr Annette Stable for back pain, started hydrocodone. Pt also has tramadol - instructed to take one or the other. Dr Posey Pronto dc'd Prograf d/t elevated sugars, pt has f/u shortly.  Consult Visit: 04/12/20: Temporary SCS placed. Removal 6/10.  03/29/20 Dr Carlis Abbott (pain mgmt): psych evaluation for spinal cord stimulation surgery; cleared for SCS.  12/13/19 Dr Early (Vasc Surg): AAA follow up, no changes, continue observation.  Medications: Outpatient Encounter Medications as of 04/19/2020  Medication Sig Note  . amLODipine (NORVASC) 5 MG tablet TAKE 1 TABLET BY MOUTH TWICE DAILY.   Marland Kitchen Ascorbic Acid (VITAMIN C) 250 MG CHEW Chew 1,000 mg by mouth daily.    . B Complex-C (SUPER B COMPLEX PO) Take 1 tablet by mouth daily.    . Blood Glucose Monitoring Suppl (ONE TOUCH ULTRA 2) w/Device KIT Use as advised   . Cholecalciferol (VITAMIN D3) 2000 UNITS TABS Take 2,000 Units by mouth daily.    . diazepam (VALIUM) 5 MG tablet Take 1 tablet (5 mg total) by mouth at bedtime as needed.   . fexofenadine (ALLEGRA) 180 MG tablet Take 180 mg by mouth at bedtime.    Marland Kitchen FLUoxetine (PROZAC) 10 MG capsule TAKE 1 CAPSULE DAILY.   . furosemide (LASIX) 40 MG tablet TAKE 1/2 TO 1 TABLET DAILY AS NEEDED FOR FLUID   . glipiZIDE (GLUCOTROL) 5 MG tablet TAKE 1 TABLET IN THE MORNING AND 2 TABLETS IN THE EVENING.   Marland Kitchen HYDROcodone-acetaminophen (NORCO/VICODIN) 5-325 MG tablet Take 1 tablet by mouth 2 (two) times daily as needed.  12/20/2019: Pt takes 1 tablet daily  . labetalol (NORMODYNE) 300 MG tablet Take 1  tablet (300 mg total) by mouth 3 (three) times daily.   . Liniments (SALONPAS PAIN RELIEF PATCH EX) Apply 1 patch topically daily as needed (back pain).   Marland Kitchen losartan (COZAAR) 100 MG tablet TAKE 1 TABLET ONCE DAILY.   . meloxicam (MOBIC) 7.5 MG tablet TAKE 1 TABLET ONCE DAILY.   Marland Kitchen ONETOUCH ULTRA test strip CHECK BLOOD SUGAR TWICE DAILY.   . rosuvastatin (CRESTOR) 20 MG tablet TAKE 1 TABLET ONCE DAILY.   . traMADol (ULTRAM) 50 MG tablet TAKE 1 TABLET DAILY AS NEEDED FOR SEVERE PAIN.   . Triamcinolone Acetonide (NASACORT ALLERGY 24HR NA) Place 1 spray into the nose 2 (two) times daily as needed (congestion).   . vitamin B-12 (CYANOCOBALAMIN) 500 MCG tablet Take 500 mcg by mouth daily.   . Vitamin E 180 MG CAPS Take 180 Units by mouth daily.    Marland Kitchen gabapentin (NEURONTIN) 300 MG capsule  12/20/2019: Pt could not tolerate d/t oversedation  . metFORMIN (GLUCOPHAGE) 500 MG tablet TAKE 1 TABLET BY MOUTH TWICE DAILY.    No facility-administered encounter medications on file as of 04/19/2020.     Current Diagnosis/Assessment:  Goals Addressed            This Visit's Progress   . COMPLETED: Medication safety with declining kidney function       Metformin will be discontinued if kidney function continues to decline, follow up  with nephrologist Dr Posey Pronto in 3 months for repeat labs  Meloxicam is also processed through the kidneys and should be used with caution    . Pharmacy Care Plan       CARE PLAN ENTRY  Current Barriers:  . Chronic Disease Management support, education, and care coordination needs related to Hypertension, Hyperlipidemia, and Diabetes   Hypertension BP Readings from Last 3 Encounters:  12/13/19 129/73  11/21/19 132/70  08/24/19 (!) 150/68 .  Pharmacist Clinical Goal(s): o Over the next 30 days, patient will work with PharmD and providers to maintain BP goal <130/80 . Current regimen:  o amlodipine 5 mg BID,  o losartan 100 mg daily,  o labetalol 300 mg TID,   o furosemide 40 mg PRN . Interventions: o Discussed BP goal and benefits of medication for kidney protection and prevention of heart attack/stroke . Patient self care activities - Over the next 30 days, patient will: o Check BP 3-5 times per week, document, and provide at future appointments o Ensure daily salt intake < 2300 mg/day  Hyperlipidemia Lipid Panel     Component Value Date/Time   CHOL 121 11/21/2019 1459   TRIG 294.0 (H) 11/21/2019 1459   HDL 39.30 11/21/2019 1459   LDLDIRECT 43.0 11/21/2019 1459 .  Pharmacist Clinical Goal(s): o Over the next 30 days, patient will work with PharmD and providers to maintain LDL goal < 70 . Current regimen:  o Rosuvastatin 20 mg daily . Interventions: o Discussed benefits of statin for prevention of heart attack and stroke . Patient self care activities - Over the next 30 days, patient will: o Continue medication as prescribed o Continue low cholesterol diet  Diabetes Lab Results  Component Value Date/Time   HGBA1C 6.6 (H) 11/21/2019 02:59 PM   HGBA1C 6.5 (A) 05/18/2019 03:22 PM   HGBA1C 6.8 (H) 11/17/2018 03:03 PM .  Pharmacist Clinical Goal(s): o Over the next 30 days, patient will work with PharmD and providers to maintain A1c goal <7% . Current regimen:  o Glipizide 5 mg in AM and 10 mg in PM with meals . Interventions: o Discussed metformin should be discontinued when GFR is less than 30 due to inability to clear the drug from the body appropriately, which increases risk for side effects o Recommend to stop metformin and continue checking sugar in the morning . Patient self care activities - Over the next 30 days, patient will: o Check blood sugar once daily and in the morning before eating or drinking, document, and provide at future appointments o Contact provider with any episodes of hypoglycemia o Stop metformin  Medication management . Pharmacist Clinical Goal(s): o Over the next 30 days, patient will work with  PharmD and providers to maintain optimal medication adherence . Current pharmacy: [facility] . Interventions o Comprehensive medication review performed. o Continue current medication management strategy . Patient self care activities - Over the next 30 days, patient will: o Focus on medication adherence by pill box o Take medications as prescribed o Report any questions or concerns to PharmD and/or provider(s)  Please see past updates related to this goal by clicking on the "Past Updates" button in the selected goal         Diabetes   Recent Relevant Labs: Lab Results  Component Value Date/Time   HGBA1C 6.6 (H) 11/21/2019 02:59 PM   HGBA1C 6.5 (A) 05/18/2019 03:22 PM   HGBA1C 6.8 (H) 11/17/2018 03:03 PM   MICROALBUR 194.0 Verified by manual dilution. (  H) 11/16/2014 12:05 PM   MICROALBUR 95.5 (H) 05/09/2014 11:28 AM    Kidney Function Lab Results  Component Value Date/Time   CREATININE 2.02 (H) 11/21/2019 02:59 PM   CREATININE 1.56 (H) 11/17/2018 03:03 PM   GFR 23.89 (L) 11/21/2019 02:59 PM   GFRNONAA 46 (L) 02/19/2017 02:27 AM   GFRAA 53 (L) 02/19/2017 02:27 AM   Last diabetic eye exam:  Lab Results  Component Value Date/Time   HMDIABEYEEXA Retinopathy (A) 05/19/2018 12:00 AM    Last diabetic foot exam: No results found for: HMDIABFOOTEX   Checking BG: Daily  Recent FBG Readings:  6/10 162 (dinner last night pot roast, potatoes, carrots) 6/9 133 6/8 130 6/7 127  Patient has failed these meds in past: Lantus, pioglitazone, metformin (stopped 04/12/20 d/t GFR < 30) -pt was started on Prograf Oct 2020 and sugars climbed to 200-300s, started Lantus at that time to manage. Once Prograf was dc'd few months later, sugars went back down and taken off insulin.  Patient is currently controlled on the following medications:   glipizide 5 qAM and 10 mg qPM with meals  We discussed: pt stopped taking metformin 04/12/20 due to GFR < 30. Fasting BG over the past week  has been 120s-130s, only 1 high reading 160 this morning. Pt has f/u appt with PCP in 1 month.  Plan  Recommend to continue current medication until f/u with PCP in 1 month Patient will call pharmacist if BG is > 150 for 3+ consecutive days  Hypertension   BP goal is:  <130/80  Office blood pressures are  BP Readings from Last 3 Encounters:  12/13/19 129/73  11/21/19 132/70  08/24/19 (!) 150/68   Patient has failed these meds in the past: valsartan, spironolactone Patient is currently controlled on the following medications:   amlodipine 5 mg BID,   losartan 100 mg daily,   labetalol 300 mg TID,   furosemide 40 mg PRN  Patient checks BP at home 3-5x per week  Patient home BP readings are ranging: SBP 120s-130s  We discussed: pt reports she has taken medication for BP since she was 30, long history of HTN may be contributing to her current kidney issues. Pt reports compliance with meds as prescribed.  Plan  Continue current medications and control with diet and exercise     Follow up: 2 month phone visit  Charlene Brooke, PharmD Clinical Pharmacist [facility] at [facility] [phone]

## 2020-04-19 NOTE — Patient Instructions (Signed)
Visit Information  Phone number for Pharmacist: [phone]  Goals Addressed            This Visit's Progress   . COMPLETED: Medication safety with declining kidney function       Metformin will be discontinued if kidney function continues to decline, follow up with nephrologist Dr Posey Pronto in 3 months for repeat labs  Meloxicam is also processed through the kidneys and should be used with caution    . Pharmacy Care Plan       CARE PLAN ENTRY  Current Barriers:  . Chronic Disease Management support, education, and care coordination needs related to Hypertension, Hyperlipidemia, and Diabetes   Hypertension BP Readings from Last 3 Encounters:  12/13/19 129/73  11/21/19 132/70  08/24/19 (!) 150/68 .  Pharmacist Clinical Goal(s): o Over the next 30 days, patient will work with PharmD and providers to maintain BP goal <130/80 . Current regimen:  o amlodipine 5 mg BID,  o losartan 100 mg daily,  o labetalol 300 mg TID,  o furosemide 40 mg PRN . Interventions: o Discussed BP goal and benefits of medication for kidney protection and prevention of heart attack/stroke . Patient self care activities - Over the next 30 days, patient will: o Check BP 3-5 times per week, document, and provide at future appointments o Ensure daily salt intake < 2300 mg/day  Hyperlipidemia Lipid Panel     Component Value Date/Time   CHOL 121 11/21/2019 1459   TRIG 294.0 (H) 11/21/2019 1459   HDL 39.30 11/21/2019 1459   LDLDIRECT 43.0 11/21/2019 1459 .  Pharmacist Clinical Goal(s): o Over the next 30 days, patient will work with PharmD and providers to maintain LDL goal < 70 . Current regimen:  o Rosuvastatin 20 mg daily . Interventions: o Discussed benefits of statin for prevention of heart attack and stroke . Patient self care activities - Over the next 30 days, patient will: o Continue medication as prescribed o Continue low cholesterol diet  Diabetes Lab Results  Component Value Date/Time    HGBA1C 6.6 (H) 11/21/2019 02:59 PM   HGBA1C 6.5 (A) 05/18/2019 03:22 PM   HGBA1C 6.8 (H) 11/17/2018 03:03 PM .  Pharmacist Clinical Goal(s): o Over the next 30 days, patient will work with PharmD and providers to maintain A1c goal <7% . Current regimen:  o Glipizide 5 mg in AM and 10 mg in PM with meals . Interventions: o Discussed metformin should be discontinued when GFR is less than 30 due to inability to clear the drug from the body appropriately, which increases risk for side effects o Recommend to stop metformin and continue checking sugar in the morning . Patient self care activities - Over the next 30 days, patient will: o Check blood sugar once daily and in the morning before eating or drinking, document, and provide at future appointments o Contact provider with any episodes of hypoglycemia o Stop metformin  Medication management . Pharmacist Clinical Goal(s): o Over the next 30 days, patient will work with PharmD and providers to maintain optimal medication adherence . Current pharmacy: [facility] . Interventions o Comprehensive medication review performed. o Continue current medication management strategy . Patient self care activities - Over the next 30 days, patient will: o Focus on medication adherence by pill box o Take medications as prescribed o Report any questions or concerns to PharmD and/or provider(s)  Please see past updates related to this goal by clicking on the "Past Updates" button in the selected goal  The patient verbalized understanding of instructions provided today and declined a print copy of patient instruction materials.   Telephone follow up appointment with pharmacy team member scheduled for: 2 months  Charlene Brooke, PharmD Clinical Pharmacist [facility] at [facility] [phone]

## 2020-05-20 NOTE — Patient Instructions (Addendum)
  Blood work was ordered.   ° ° °Medications reviewed and updated.  Changes include :   none ° ° ° °Please followup in 6 months ° ° °

## 2020-05-20 NOTE — Progress Notes (Signed)
Subjective:    Patient ID: Sheila Melton, female    DOB: Jan 08, 1941, 79 y.o.   MRN: [id number]  HPI The patient is here for follow up of their chronic medical problems, including DM, htn, CKD, hyperlipidemia, anxiety, depression, chronic back pain, sleep difficulties  She is taking all of her medications as prescribed.   She is not exercising regularly.     She is getting a nerve stimulator this Friday.  The trial run worked well.  She was eligible for surgery but is not able to have surgery due to her anuerysm.    Sugars have been elevated since being off the metformin.  It has been 200 3-4 times.  The rest of the time it has been 130-140's.  She has been eating well.    Medications and allergies reviewed with patient and updated if appropriate.  Patient Active Problem List   Diagnosis Date Noted  . Constipation 11/17/2018  . Skin yeast infection 02/26/2017  . 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 Osteoporosis 12/25/2016  . Depression 08/15/2016  . 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 CKD (chronic kidney disease) 05/21/2016  . Sleep difficulties 01/18/2016  . Diabetes mellitus ([place]) 10/23/2015  . Numbness of toes-Right foot 06/26/2015  . Colitis 03/19/2015  . Nephrolithiasis 03/19/2015  . AAA (abdominal aortic aneurysm) without rupture ([place]) 03/19/2015  . Anxiety state 03/19/2015  . Skin cancer 01/09/2015  . Former smoker 05/24/2014  . Bilateral renal cysts 09/16/2012  . 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 Degenerative spondylolisthesis 07/26/2012  . SHOULDER PAIN, LEFT, CHRONIC 07/25/2009  . CARDIAC MURMUR, SYSTOLIC 09/06/2535  . Mixed hyperlipidemia 12/30/2007  . Tobacco use disorder 12/30/2007  . Essential hypertension 12/30/2007  . Subclavian steal syndrome 12/30/2007  . CAROTID BRUIT 04/07/2007    Current Outpatient Medications on File Prior to Visit  Medication Sig Dispense Refill  . amLODipine (NORVASC) 5  MG tablet TAKE 1 TABLET BY MOUTH TWICE DAILY. 180 tablet 0  . Ascorbic Acid (VITAMIN C) 250 MG CHEW Chew 1,000 mg by mouth daily.     . B Complex-C (SUPER B COMPLEX PO) Take 1 tablet by mouth daily.     . Blood Glucose Monitoring Suppl (ONE TOUCH ULTRA 2) w/Device KIT Use as advised 1 each 0  . Cholecalciferol (VITAMIN D3) 2000 UNITS TABS Take 2,000 Units by mouth daily.     . diazepam (VALIUM) 5 MG tablet Take 1 tablet (5 mg total) by mouth at bedtime as needed. 90 tablet 0  . fexofenadine (ALLEGRA) 180 MG tablet Take 180 mg by mouth at bedtime.     Marland Kitchen FLUoxetine (PROZAC) 10 MG capsule TAKE 1 CAPSULE DAILY. 90 capsule 0  . furosemide (LASIX) 40 MG tablet TAKE 1/2 TO 1 TABLET DAILY AS NEEDED FOR FLUID 90 tablet 0  . glipiZIDE (GLUCOTROL) 5 MG tablet TAKE 1 TABLET IN THE MORNING AND 2 TABLETS IN THE EVENING. 270 tablet 0  . labetalol (NORMODYNE) 300 MG tablet Take 1 tablet (300 mg total) by mouth 3 (three) times daily.    . Liniments (SALONPAS PAIN RELIEF PATCH EX) Apply 1 patch topically daily as needed (back pain).    Marland Kitchen losartan (COZAAR) 100 MG tablet TAKE 1 TABLET ONCE DAILY. 90 tablet 0  . ONETOUCH ULTRA test strip CHECK BLOOD SUGAR TWICE DAILY. 100 strip 0  . oxyCODONE-acetaminophen (PERCOCET/ROXICET) 5-325 MG tablet Take 1 tablet by mouth 3 (three) times daily as needed.    . rosuvastatin (  CRESTOR) 20 MG tablet TAKE 1 TABLET ONCE DAILY. 90 tablet 0  . Triamcinolone Acetonide (NASACORT ALLERGY 24HR NA) Place 1 spray into the nose 2 (two) times daily as needed (congestion).    . vitamin B-12 (CYANOCOBALAMIN) 500 MCG tablet Take 500 mcg by mouth daily.    . Vitamin E 180 MG CAPS Take 180 Units by mouth daily.      No current facility-administered medications on file prior to visit.    Past Medical History:  Diagnosis Date  . AAA (abdominal aortic aneurysm) (HCC)    BEING WATCHED   VVS. 4.2 cm infrarenal abdominal aortic aneurysm without rupture noted 11/08/2016  . Arthritis   . Cancer  ([place])    skin cancer removed from left shoulder  2017  . CAP (community acquired pneumonia) 09/02/13-11/05/13    with SIRS  . Depression    ?? from prednisone    not on pred now  . Diabetes mellitus    type ii    long time ago.  . Family history of anesthesia complication    PATIENTS MOM HAD TROUBLE WAKING UP   . GERD (gastroesophageal reflux disease)    will take occasional tums  . Heart murmur    still has.   Sees Dr. Stanford Breed  . History of kidney stones    has them at the present time  . Hyperlipidemia   . Hypertension    unspecified essential  . Renal insufficiency    "FSGS"  . Subclavian steal syndrome    LEFT SIDE   NO SIDE EFFECTS  . Subclavian steal syndrome --  left    85% blocked  . UTI (lower urinary tract infection) 08/26/13   Enterococcus and Escherichia coli both sensitive to nitrofurantoin    Past Surgical History:  Procedure Laterality Date  . ABDOMINAL HYSTERECTOMY    . BACK SURGERY    . CATARACT EXTRACTION     OS  . CYST EXCISION Right 05-23-13   Right index finger: cyst  . DILATION AND CURETTAGE OF UTERUS    . EYE SURGERY    . g1 p1    . SPINE SURGERY  Sept. 2013  . VIDEO ASSISTED THORACOSCOPY (VATS)/ LOBECTOMY Right 02/16/2017   Procedure: Right VIDEO ASSISTED THORACOSCOPY (VATS)/ Right Middle LOBECTOMY;  Surgeon: [name], MD;  Location: [place];  Service: Thoracic;  Laterality: Right;    Social History   Socioeconomic History  . Marital status: Married    Spouse name: Not on file  . Number of children: 1  . Years of education: Not on file  . Highest education level: Not on file  Occupational History  . Not on file  Tobacco Use  . Smoking status: Light Tobacco Smoker    Packs/day: 1.00    Years: 50.00    Pack years: 50.00    Types: E-cigarettes  . Smokeless tobacco: Never Used  . Tobacco comment: e-cigs  Vaping Use  . Vaping Use: Never used  Substance and Sexual Activity  . Alcohol use: No  . Drug use: No  . Sexual  activity: Not on file  Other Topics Concern  . Not on file  Social History Narrative   Has 3 caffeinated bev a day      Exercise: none   Social Determinants of Health   Financial Resource Strain:   . Difficulty of Paying Living Expenses:   Food Insecurity:   . Worried About Charity fundraiser in the Last Year:   .  Ran Out of Food in the Last Year:   Transportation Needs:   . Film/video editor (Medical):   Marland Kitchen Lack of Transportation (Non-Medical):   Physical Activity:   . Days of Exercise per Week:   . Minutes of Exercise per Session:   Stress:   . Feeling of Stress :   Social Connections:   . Frequency of Communication with Friends and Family:   . Frequency of Social Gatherings with Friends and Family:   . Attends Religious Services:   . Active Member of Clubs or Organizations:   . Attends Archivist Meetings:   Marland Kitchen Marital Status:     Family History  Problem Relation Age of Onset  . Heart failure Mother   . Heart disease Mother   . Hypertension Mother   . Other Mother        varicose veins  . Deep vein thrombosis Mother   . Varicose Veins Mother   . Heart attack Mother   . Hypertension Sister        X 2  . Diabetes Sister   . Hyperlipidemia Sister   . Other Sister        varicose veins  . Heart disease Father   . Diabetes Father   . Hyperlipidemia Father   . Hypertension Father   . Heart attack Father   . Heart disease Brother        MI in late 28s  . Hyperlipidemia Brother   . Heart attack Brother   . Coronary artery disease Other   . Diabetes Other   . Parkinsonism Brother   . Heart failure Brother   . Hypertension Daughter     Review of Systems  Constitutional: Negative for chills and fever.  Respiratory: Negative for cough, shortness of breath and wheezing.   Cardiovascular: Negative for chest pain, palpitations and leg swelling.  Musculoskeletal: Positive for back pain.  Neurological: Negative for light-headedness and headaches.        Objective:   Vitals:   05/21/20 1402  BP: 120/70  Pulse: 77  Temp: 98.1 F (36.7 C)  SpO2: 98%   BP Readings from Last 3 Encounters:  05/21/20 120/70  12/13/19 129/73  11/21/19 132/70   Wt Readings from Last 3 Encounters:  05/21/20 157 lb (71.2 kg)  12/13/19 157 lb 4.8 oz (71.4 kg)  11/21/19 157 lb (71.2 kg)   Body mass index is 26.95 kg/m.   Physical Exam    Constitutional: Appears well-developed and well-nourished. No distress.  HENT:  Head: Normocephalic and atraumatic.  Neck: Neck supple. No tracheal deviation present. No thyromegaly present.  No cervical lymphadenopathy Cardiovascular: Normal rate, regular rhythm and normal heart sounds.  No murmur heard. No carotid bruit .  No edema Pulmonary/Chest: Effort normal and breath sounds normal. No respiratory distress. No has no wheezes. No rales.  Skin: Skin is warm and dry. Not diaphoretic.  Psychiatric: Normal mood and affect. Behavior is normal.    Diabetic Foot Exam - Simple   Simple Foot Form Diabetic Foot exam was performed with the following findings: Yes 05/21/2020  2:23 PM  Visual Inspection No deformities, no ulcerations, no other skin breakdown bilaterally: Yes Sensation Testing Intact to touch and monofilament testing bilaterally: Yes Pulse Check Posterior Tibialis and Dorsalis pulse intact bilaterally: Yes Comments       Assessment & Plan:    See Problem List for Assessment and Plan of chronic medical problems.    This visit occurred during the  SARS-CoV-2 public health emergency.  Safety protocols were in place, including screening questions prior to the visit, additional usage of staff PPE, and extensive cleaning of exam room while observing appropriate contact time as indicated for disinfecting solutions.

## 2020-05-21 ENCOUNTER — Ambulatory Visit (INDEPENDENT_AMBULATORY_CARE_PROVIDER_SITE_OTHER): Payer: PPO | Admitting: Internal Medicine

## 2020-05-21 ENCOUNTER — Other Ambulatory Visit: Payer: Self-pay

## 2020-05-21 ENCOUNTER — Encounter: Payer: Self-pay | Admitting: Internal Medicine

## 2020-05-21 VITALS — BP 120/70 | HR 77 | Temp 98.1°F | Ht 64.0 in | Wt 157.0 lb

## 2020-05-21 DIAGNOSIS — N1832 Chronic kidney disease, stage 3b: Secondary | ICD-10-CM | POA: Diagnosis not present

## 2020-05-21 DIAGNOSIS — E782 Mixed hyperlipidemia: Secondary | ICD-10-CM

## 2020-05-21 DIAGNOSIS — G479 Sleep disorder, unspecified: Secondary | ICD-10-CM | POA: Diagnosis not present

## 2020-05-21 DIAGNOSIS — E1159 Type 2 diabetes mellitus with other circulatory complications: Secondary | ICD-10-CM | POA: Diagnosis not present

## 2020-05-21 DIAGNOSIS — I10 Essential (primary) hypertension: Secondary | ICD-10-CM

## 2020-05-21 DIAGNOSIS — F411 Generalized anxiety disorder: Secondary | ICD-10-CM | POA: Diagnosis not present

## 2020-05-21 DIAGNOSIS — F3289 Other specified depressive episodes: Secondary | ICD-10-CM

## 2020-05-21 NOTE — Assessment & Plan Note (Signed)
Chronic Check a1c Stopped metformin due to CKD On glipizide Low sugar / carb diet

## 2020-05-21 NOTE — Assessment & Plan Note (Signed)
Chronic Controlled, stable Continue current dose of medication - valium at night Also on oxycodone which helps with sleep

## 2020-05-21 NOTE — Assessment & Plan Note (Signed)
Chronic BP well controlled Current regimen effective and well tolerated Continue current medications at current doses cmp  

## 2020-05-21 NOTE — Assessment & Plan Note (Signed)
Chronic Check lipid panel  Continue daily statin Regular exercise and healthy diet encouraged  

## 2020-05-21 NOTE — Assessment & Plan Note (Signed)
Chronic Follows with Dr Posey Pronto cmp today Off metformin and meloxicam

## 2020-05-21 NOTE — Assessment & Plan Note (Signed)
Chronic Controlled, stable Continue prozac 10 mg daily and valium nightly

## 2020-05-21 NOTE — Assessment & Plan Note (Signed)
Chronic Controlled, stable She does have some depression related to things she can not do Continue current dose of medication prozac 10 mg daily

## 2020-05-22 LAB — CBC WITH DIFFERENTIAL/PLATELET
Absolute Monocytes: 1109 {cells}/uL — ABNORMAL HIGH (ref 200–950)
Basophils Absolute: 67 {cells}/uL (ref 0–200)
Basophils Relative: 0.6 %
Eosinophils Absolute: 605 {cells}/uL — ABNORMAL HIGH (ref 15–500)
Eosinophils Relative: 5.4 %
HCT: 32.6 % — ABNORMAL LOW (ref 35.0–45.0)
Hemoglobin: 10.8 g/dL — ABNORMAL LOW (ref 11.7–15.5)
Lymphs Abs: 3539 {cells}/uL (ref 850–3900)
MCH: 29.1 pg (ref 27.0–33.0)
MCHC: 33.1 g/dL (ref 32.0–36.0)
MCV: 87.9 fL (ref 80.0–100.0)
MPV: 9.6 fL (ref 7.5–12.5)
Monocytes Relative: 9.9 %
Neutro Abs: 5880 {cells}/uL (ref 1500–7800)
Neutrophils Relative %: 52.5 %
Platelets: 318 Thousand/uL (ref 140–400)
RBC: 3.71 Million/uL — ABNORMAL LOW (ref 3.80–5.10)
RDW: 13.1 % (ref 11.0–15.0)
Total Lymphocyte: 31.6 %
WBC: 11.2 Thousand/uL — ABNORMAL HIGH (ref 3.8–10.8)

## 2020-05-22 LAB — HEMOGLOBIN A1C
Hgb A1c MFr Bld: 6.9 % of total Hgb — ABNORMAL HIGH (ref <5.7)
Mean Plasma Glucose: 151 (calc)
eAG (mmol/L): 8.4 (calc)

## 2020-05-22 LAB — LIPID PANEL
Cholesterol: 142 mg/dL (ref <200)
HDL: 36 mg/dL — ABNORMAL LOW (ref >=50)
LDL Cholesterol (Calc): 70 mg/dL (calc)
Non-HDL Cholesterol (Calc): 106 mg/dL (calc) (ref <130)
Total CHOL/HDL Ratio: 3.9 (calc) (ref <5.0)
Triglycerides: 275 mg/dL — ABNORMAL HIGH (ref <150)

## 2020-05-22 LAB — COMPREHENSIVE METABOLIC PANEL
AG Ratio: 1.5 (calc) (ref 1.0–2.5)
ALT: 11 U/L (ref 6–29)
AST: 16 U/L (ref 10–35)
Albumin: 4.1 g/dL (ref 3.6–5.1)
Alkaline phosphatase (APISO): 80 U/L (ref 37–153)
BUN/Creatinine Ratio: 12 (calc) (ref 6–22)
BUN: 32 mg/dL — ABNORMAL HIGH (ref 7–25)
CO2: 26 mmol/L (ref 20–32)
Calcium: 9.7 mg/dL (ref 8.6–10.4)
Chloride: 103 mmol/L (ref 98–110)
Creat: 2.6 mg/dL — ABNORMAL HIGH (ref 0.60–0.93)
Globulin: 2.7 g/dL (calc) (ref 1.9–3.7)
Glucose, Bld: 147 mg/dL — ABNORMAL HIGH (ref 65–99)
Potassium: 3.7 mmol/L (ref 3.5–5.3)
Sodium: 138 mmol/L (ref 135–146)
Total Bilirubin: 0.3 mg/dL (ref 0.2–1.2)
Total Protein: 6.8 g/dL (ref 6.1–8.1)

## 2020-05-25 DIAGNOSIS — G894 Chronic pain syndrome: Secondary | ICD-10-CM | POA: Diagnosis not present

## 2020-05-25 DIAGNOSIS — M961 Postlaminectomy syndrome, not elsewhere classified: Secondary | ICD-10-CM | POA: Diagnosis not present

## 2020-05-28 ENCOUNTER — Other Ambulatory Visit: Payer: Self-pay

## 2020-05-28 DIAGNOSIS — I714 Abdominal aortic aneurysm, without rupture, unspecified: Secondary | ICD-10-CM

## 2020-06-12 ENCOUNTER — Ambulatory Visit
Admission: RE | Admit: 2020-06-12 | Discharge: 2020-06-12 | Disposition: A | Discharge status: Home / Self Care | Payer: PPO | Source: Ambulatory Visit | Attending: Vascular Surgery | Admitting: Vascular Surgery

## 2020-06-12 DIAGNOSIS — M47816 Spondylosis without myelopathy or radiculopathy, lumbar region: Secondary | ICD-10-CM | POA: Diagnosis not present

## 2020-06-12 DIAGNOSIS — I714 Abdominal aortic aneurysm, without rupture, unspecified: Secondary | ICD-10-CM

## 2020-06-12 DIAGNOSIS — I712 Thoracic aortic aneurysm, without rupture: Secondary | ICD-10-CM | POA: Diagnosis not present

## 2020-06-12 DIAGNOSIS — I700 Atherosclerosis of aorta: Secondary | ICD-10-CM | POA: Diagnosis not present

## 2020-06-19 ENCOUNTER — Ambulatory Visit: Payer: PPO | Admitting: Vascular Surgery

## 2020-06-19 ENCOUNTER — Encounter: Payer: Self-pay | Admitting: Vascular Surgery

## 2020-06-19 ENCOUNTER — Other Ambulatory Visit: Payer: Self-pay

## 2020-06-19 VITALS — BP 121/64 | HR 84 | Temp 98.2°F | Resp 18 | Ht 63.5 in | Wt 157.3 lb

## 2020-06-19 DIAGNOSIS — I714 Abdominal aortic aneurysm, without rupture, unspecified: Secondary | ICD-10-CM

## 2020-06-19 NOTE — Progress Notes (Signed)
[facility] [place]  Patient name: Sheila Melton MRN: [id number] DOB: 11-28-1940 Sex: female  REASON FOR VISIT: Discussion of CT scan for infrarenal abdominal aortic aneurysm  HPI: Sheila Melton is a 79 y.o. female here today for discussion of recent CT scan for follow-up of her infrarenal abdominal aortic aneurysm.  My last visit with her in February ultrasound showed maximal diameter 4.9 cm.  She continues to have a great deal difficulty regarding back pain.  Recently had a spinal stimulator placed.  She has no symptoms referable to her aneurysm.  She is here today with her husband  Past Medical History:  Diagnosis Date  . AAA (abdominal aortic aneurysm) (HCC)    BEING WATCHED   VVS. 4.2 cm infrarenal abdominal aortic aneurysm without rupture noted 11/08/2016  . Arthritis   . Cancer ([place])    skin cancer removed from left shoulder  2017  . CAP (community acquired pneumonia) 09/02/13-11/05/13    with SIRS  . Depression    ?? from prednisone    not on pred now  . Diabetes mellitus    type ii    long time ago.  . Family history of anesthesia complication    PATIENTS MOM HAD TROUBLE WAKING UP   . GERD (gastroesophageal reflux disease)    will take occasional tums  . Heart murmur    still has.   Sees Dr. Stanford Breed  . History of kidney stones    has them at the present time  . Hyperlipidemia   . Hypertension    unspecified essential  . Renal insufficiency    "FSGS"  . Subclavian steal syndrome    LEFT SIDE   NO SIDE EFFECTS  . Subclavian steal syndrome --  left    85% blocked  . UTI (lower urinary tract infection) 08/26/13   Enterococcus and Escherichia coli both sensitive to nitrofurantoin    Family History  Problem Relation Age of Onset  . Heart failure Mother   . Heart disease Mother   . Hypertension Mother   . Other Mother        varicose veins  . Deep vein thrombosis Mother   . Varicose Veins Mother   .  Heart attack Mother   . Hypertension Sister        X 2  . Diabetes Sister   . Hyperlipidemia Sister   . Other Sister        varicose veins  . Heart disease Father   . Diabetes Father   . Hyperlipidemia Father   . Hypertension Father   . Heart attack Father   . Heart disease Brother        MI in late 72s  . Hyperlipidemia Brother   . Heart attack Brother   . Coronary artery disease Other   . Diabetes Other   . Parkinsonism Brother   . Heart failure Brother   . Hypertension Daughter     SOCIAL HISTORY: Social History   Tobacco Use  . Smoking status: Light Tobacco Smoker    Packs/day: 1.00    Years: 50.00    Pack years: 50.00    Types: E-cigarettes  . Smokeless tobacco: Never Used  . Tobacco comment: e-cigs  Substance Use Topics  . Alcohol use: No    Allergies  Allergen Reactions  . Rocephin [Ceftriaxone Sodium In Dextrose] Dermatitis and Rash    10/24 /14 blisters of palms & diffuse rash Because of a history of documented  adverse serious drug reaction;Medi Alert bracelet  is recommended  . Amoxicillin Rash    Has patient had a PCN reaction causing IMMEDIATE RASH, FACIAL/TONGUE/THROAT SWELLING, SOB, OR LIGHTHEADEDNESS WITH HYPOTENSION:  #  #  #  YES  #  #  #  Has patient had a PCN reaction causing severe rash involving mucus membranes or skin necrosis: No Has patient had a PCN reaction that required hospitalization No Has patient had a PCN reaction occurring within the last 10 years: No If all of the above answers are "NO", then may proceed with Cephalosporin use.   . Captopril Cough  . Simvastatin Other (See Comments)    MYALGIAS BUTTOCKS CRAMP  . Ciprofloxacin Other (See Comments)    Sores in mouth   . Adhesive [Tape] Rash  . Latex Rash    Patient states she gets red and a rash when latex touches her.    Current Outpatient Medications  Medication Sig Dispense Refill  . amLODipine (NORVASC) 5 MG tablet TAKE 1 TABLET BY MOUTH TWICE DAILY. 180 tablet 0  .  Ascorbic Acid (VITAMIN C) 250 MG CHEW Chew 1,000 mg by mouth daily.     . B Complex-C (SUPER B COMPLEX PO) Take 1 tablet by mouth daily.     . Blood Glucose Monitoring Suppl (ONE TOUCH ULTRA 2) w/Device KIT Use as advised 1 each 0  . Cholecalciferol (VITAMIN D3) 2000 UNITS TABS Take 2,000 Units by mouth daily.     . diazepam (VALIUM) 5 MG tablet Take 1 tablet (5 mg total) by mouth at bedtime as needed. 90 tablet 0  . fexofenadine (ALLEGRA) 180 MG tablet Take 180 mg by mouth at bedtime.     Marland Kitchen FLUoxetine (PROZAC) 10 MG capsule TAKE 1 CAPSULE DAILY. 90 capsule 0  . furosemide (LASIX) 40 MG tablet TAKE 1/2 TO 1 TABLET DAILY AS NEEDED FOR FLUID 90 tablet 0  . glipiZIDE (GLUCOTROL) 5 MG tablet TAKE 1 TABLET IN THE MORNING AND 2 TABLETS IN THE EVENING. 270 tablet 0  . labetalol (NORMODYNE) 300 MG tablet Take 1 tablet (300 mg total) by mouth 3 (three) times daily.    . Liniments ([place] PAIN RELIEF PATCH EX) Apply 1 patch topically daily as needed (back pain).    Marland Kitchen losartan (COZAAR) 100 MG tablet TAKE 1 TABLET ONCE DAILY. 90 tablet 0  . ONETOUCH ULTRA test strip CHECK BLOOD SUGAR TWICE DAILY. 100 strip 0  . oxyCODONE-acetaminophen (PERCOCET/ROXICET) 5-325 MG tablet Take 1 tablet by mouth 3 (three) times daily as needed.    . rosuvastatin (CRESTOR) 20 MG tablet TAKE 1 TABLET ONCE DAILY. 90 tablet 0  . Triamcinolone Acetonide (NASACORT ALLERGY 24HR NA) Place 1 spray into the nose 2 (two) times daily as needed (congestion).    . vitamin B-12 (CYANOCOBALAMIN) 500 MCG tablet Take 500 mcg by mouth daily.    . Vitamin E 180 MG CAPS Take 180 Units by mouth daily.      No current facility-administered medications for this visit.    REVIEW OF SYSTEMS:  '[X]'$  denotes positive finding, '[ ]'$  denotes negative finding Cardiac  Comments:  Chest pain or chest pressure:    Shortness of breath upon exertion:    Short of breath when lying flat:    Irregular heart rhythm:        Vascular    Pain in calf, thigh, or  hip brought on by ambulation:    Pain in feet at night that wakes you up from your  sleep:     Blood clot in your veins:    Leg swelling:           PHYSICAL EXAM: Vitals:   06/19/20 1427  BP: 121/64  Pulse: 84  Resp: 18  Temp: 98.2 F (36.8 C)  TempSrc: Temporal  SpO2: 96%  Weight: 157 lb 4.8 oz (71.4 kg)  Height: 5' 3.5" (1.613 m)    GENERAL: The patient is a well-nourished female, in no acute distress. The vital signs are documented above. No palpable pedal pulses bilaterally  Data I reviewed the CT images with the patient and her husband.  This shows maximal diameter of her infrarenal aortic aneurysm at 5.1 cm.  This is a noncontrast study due to her renal insufficiency with a creatinine of 2.6.  She does have adequate infrarenal neck for stent graft placement.  Unfortunately she has very severe iliac occlusive disease with very small iliac arteries and severe calcification and narrowing.  MEDICAL ISSUES: Had a long discussion with the patient and her husband.  I explained that the typical threshold for discussion of aneurysm repair is 5 cm for female.  She certainly brings additional risk with her renal insufficiency and severe iliac occlusive disease.  I feel that she has very high risk for complication of her iliac arteries and attempting to place large deployment systems.  I did explain that she would be a good candidate for open repair but again would be at significant risk due to her comorbidities.  I feel that her risk for rupture is less than her risk for surgery over intermediate period of time and I recommended that we see her again in 6 months for repeat noncontrast CT imaging.  If she shows progression we will be offering repair.  I again reviewed symptoms of leaking aneurysm with her and she knows to call 911 and report to the emergency room should this occur    Rosetta Posner, MD [facility] Specialists of [facility] Tel [phone] Pager [phone]

## 2020-06-26 ENCOUNTER — Ambulatory Visit: Payer: PPO | Admitting: Vascular Surgery

## 2020-06-27 ENCOUNTER — Other Ambulatory Visit: Payer: Self-pay | Admitting: Internal Medicine

## 2020-06-28 ENCOUNTER — Other Ambulatory Visit: Payer: Self-pay | Admitting: Internal Medicine

## 2020-06-29 ENCOUNTER — Other Ambulatory Visit: Payer: Self-pay | Admitting: Internal Medicine

## 2020-07-05 DIAGNOSIS — M5416 Radiculopathy, lumbar region: Secondary | ICD-10-CM | POA: Diagnosis not present

## 2020-07-05 DIAGNOSIS — M47817 Spondylosis without myelopathy or radiculopathy, lumbosacral region: Secondary | ICD-10-CM | POA: Diagnosis not present

## 2020-07-05 DIAGNOSIS — Z9689 Presence of other specified functional implants: Secondary | ICD-10-CM | POA: Diagnosis not present

## 2020-07-09 DIAGNOSIS — H40013 Open angle with borderline findings, low risk, bilateral: Secondary | ICD-10-CM | POA: Diagnosis not present

## 2020-07-12 DIAGNOSIS — N184 Chronic kidney disease, stage 4 (severe): Secondary | ICD-10-CM | POA: Diagnosis not present

## 2020-07-18 DIAGNOSIS — D631 Anemia in chronic kidney disease: Secondary | ICD-10-CM | POA: Diagnosis not present

## 2020-07-18 DIAGNOSIS — N2581 Secondary hyperparathyroidism of renal origin: Secondary | ICD-10-CM | POA: Diagnosis not present

## 2020-07-18 DIAGNOSIS — I129 Hypertensive chronic kidney disease with stage 1 through stage 4 chronic kidney disease, or unspecified chronic kidney disease: Secondary | ICD-10-CM | POA: Diagnosis not present

## 2020-07-18 DIAGNOSIS — N184 Chronic kidney disease, stage 4 (severe): Secondary | ICD-10-CM | POA: Diagnosis not present

## 2020-07-31 ENCOUNTER — Telehealth: Payer: Self-pay | Admitting: Pharmacist

## 2020-07-31 NOTE — Progress Notes (Signed)
A user error has taken place: encounter opened in error, closed for administrative reasons.

## 2020-08-21 ENCOUNTER — Ambulatory Visit (INDEPENDENT_AMBULATORY_CARE_PROVIDER_SITE_OTHER): Payer: PPO | Admitting: General Practice

## 2020-08-21 ENCOUNTER — Other Ambulatory Visit: Payer: Self-pay

## 2020-08-21 DIAGNOSIS — Z23 Encounter for immunization: Secondary | ICD-10-CM | POA: Diagnosis not present

## 2020-08-23 ENCOUNTER — Ambulatory Visit: Payer: PPO

## 2020-09-05 ENCOUNTER — Telehealth: Payer: Self-pay | Admitting: Pharmacist

## 2020-09-05 NOTE — Progress Notes (Signed)
Received call from patient wanting to discuss recent high BG.  She reports BG has been in the 200s consistently, and was 400 at night one day this week. When it was 400 she had eaten a baked potato and hamburger for dinner.   She also reports she had 2 falls this week - one in the bathroom and one on the front porch. She denies hitting her head, denies significant injury and has just been using Tylenol Arthritis twice daily for the pain. She also had a spinal stimulator placed in July that helps with her back pain.   She has been off metformin for several months due to kidney function (GFR < 30). She is only taking glipizide 5 mg AM and 10 mg PM. She denies significant changes in diet or lifestyle, denies excessive pain, denies signs/symptoms of infection.  Pt is worried her meter may be dysfunctional, she has had it for over 3 years.  I advised patient make appointment to see PCP to address BG issues and falls, she will bring her meter to appt.  Recommendations: -PCP visit to address high BG and falls -May provide new meter if current meter is dysfunctional -A1c and BMP to guide therapy decisions -if additional DM therapy is needed, recommend Januvia 25 mg based on mild side effect profile and ability to use with GFR < 30

## 2020-09-06 NOTE — Progress Notes (Signed)
Subjective:    Patient ID: Sheila Melton, female    DOB: [birth year], 79 y.o.   MRN: [id number]  HPI The patient is here for an acute visit.   She has had 2 falls last week.  One in the bathroom  - she sat down on a stool to put her socks on and she had clothes on the stool and slid off the stool.  The other time she was sweeping off the leaves on front porch and she was too close to the step and fell of the one step.   She had the stimulator for her back put in two months ago and her legs tingle constantly.  That may have something to do with it.  She had a couple of minor injuries.     She is also having high BS at home. Sugars in 200's at home - one night it was 400. It fluctuates - it was 120 and 130 the past couple of mornings.  It was in the 200's after dinner.  She has been eating grapes in the past two weeks.  For breakfast she sometimes eats raisin Bran or another cereal with a banana.     Medications and allergies reviewed with patient and updated if appropriate.  Patient Active Problem List   Diagnosis Date Noted  . Constipation 11/17/2018  . Skin yeast infection 02/26/2017  . 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 Osteoporosis 12/25/2016  . Depression 08/15/2016  . 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 CKD (chronic kidney disease) 05/21/2016  . Sleep difficulties 01/18/2016  . Diabetes mellitus ([place]) 10/23/2015  . Numbness of toes-Right foot 06/26/2015  . Colitis 03/19/2015  . Nephrolithiasis 03/19/2015  . AAA (abdominal aortic aneurysm) without rupture ([place]) 03/19/2015  . Anxiety state 03/19/2015  . Skin cancer 01/09/2015  . Former smoker 05/24/2014  . Bilateral renal cysts 09/16/2012  . 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 Degenerative spondylolisthesis 07/26/2012  . SHOULDER PAIN, LEFT, CHRONIC 07/25/2009  . CARDIAC MURMUR, SYSTOLIC 41/32/4401  . Mixed hyperlipidemia 12/30/2007  . Tobacco use disorder  12/30/2007  . Essential hypertension 12/30/2007  . Subclavian steal syndrome 12/30/2007  . CAROTID BRUIT 04/07/2007    Current Outpatient Medications on File Prior to Visit  Medication Sig Dispense Refill  . amLODipine (NORVASC) 5 MG tablet TAKE 1 TABLET BY MOUTH TWICE DAILY. 180 tablet 0  . Ascorbic Acid (VITAMIN C) 250 MG CHEW Chew 1,000 mg by mouth daily.     . B Complex-C (SUPER B COMPLEX PO) Take 1 tablet by mouth daily.     . Blood Glucose Monitoring Suppl (ONE TOUCH ULTRA 2) w/Device KIT Use as advised 1 each 0  . Cholecalciferol (VITAMIN D3) 2000 UNITS TABS Take 2,000 Units by mouth daily.     . diazepam (VALIUM) 5 MG tablet TAKE 1 TABLET AT BEDTIME AS NEEDED. 90 tablet 0  . fexofenadine (ALLEGRA) 180 MG tablet Take 180 mg by mouth at bedtime.     Marland Kitchen FLUoxetine (PROZAC) 10 MG capsule TAKE 1 CAPSULE DAILY. 90 capsule 0  . furosemide (LASIX) 40 MG tablet TAKE 1/2 TO 1 TABLET DAILY AS NEEDED FOR FLUID 90 tablet 0  . glipiZIDE (GLUCOTROL) 5 MG tablet TAKE 1 TABLET IN THE MORNING AND 2 TABLETS IN THE EVENING. 270 tablet 0  . labetalol (NORMODYNE) 300 MG tablet Take 1 tablet (300 mg total) by mouth 3 (three) times daily.    . Liniments ([place]  EX) Apply 1 patch topically daily as needed (back pain).    Marland Kitchen losartan (COZAAR) 100 MG tablet TAKE 1 TABLET ONCE DAILY. 90 tablet 0  . ONETOUCH ULTRA test strip CHECK BLOOD SUGAR TWICE DAILY. 100 strip 0  . oxyCODONE-acetaminophen (PERCOCET/ROXICET) 5-325 MG tablet Take 1 tablet by mouth 3 (three) times daily as needed.    . rosuvastatin (CRESTOR) 20 MG tablet TAKE 1 TABLET ONCE DAILY. 90 tablet 0  . Triamcinolone Acetonide (NASACORT ALLERGY 24HR NA) Place 1 spray into the nose 2 (two) times daily as needed (congestion).    . vitamin B-12 (CYANOCOBALAMIN) 500 MCG tablet Take 500 mcg by mouth daily.    . Vitamin E 180 MG CAPS Take 180 Units by mouth daily.      No current facility-administered medications on file prior to visit.      Past Medical History:  Diagnosis Date  . AAA (abdominal aortic aneurysm) (HCC)    BEING WATCHED   VVS. 4.2 cm infrarenal abdominal aortic aneurysm without rupture noted 11/08/2016  . Arthritis   . Cancer ([place])    skin cancer removed from left shoulder  2017  . CAP (community acquired pneumonia) 09/02/13-11/05/13    with SIRS  . Depression    ?? from prednisone    not on pred now  . Diabetes mellitus    type ii    long time ago.  . Family history of anesthesia complication    PATIENTS MOM HAD TROUBLE WAKING UP   . GERD (gastroesophageal reflux disease)    will take occasional tums  . Heart murmur    still has.   Sees Dr. Stanford Breed  . History of kidney stones    has them at the present time  . Hyperlipidemia   . Hypertension    unspecified essential  . Renal insufficiency    "FSGS"  . Subclavian steal syndrome    LEFT SIDE   NO SIDE EFFECTS  . Subclavian steal syndrome --  left    85% blocked  . UTI (lower urinary tract infection) 08/26/13   Enterococcus and Escherichia coli both sensitive to nitrofurantoin    Past Surgical History:  Procedure Laterality Date  . ABDOMINAL HYSTERECTOMY    . BACK SURGERY    . CATARACT EXTRACTION     OS  . CYST EXCISION Right 05-23-13   Right index finger: cyst  . DILATION AND CURETTAGE OF UTERUS    . EYE SURGERY    . g1 p1    . SPINE SURGERY  Sept. 2013  . VIDEO ASSISTED THORACOSCOPY (VATS)/ LOBECTOMY Right 02/16/2017   Procedure: Right VIDEO ASSISTED THORACOSCOPY (VATS)/ Right Middle LOBECTOMY;  Surgeon: [name], MD;  Location: [facility];  Service: Thoracic;  Laterality: Right;    Social History   Socioeconomic History  . Marital status: Married    Spouse name: Not on file  . Number of children: 1  . Years of education: Not on file  . Highest education level: Not on file  Occupational History  . Not on file  Tobacco Use  . Smoking status: Light Tobacco Smoker    Packs/day: 1.00    Years: 50.00    Pack years:  50.00    Types: E-cigarettes  . Smokeless tobacco: Never Used  . Tobacco comment: e-cigs  Vaping Use  . Vaping Use: Never used  Substance and Sexual Activity  . Alcohol use: No  . Drug use: No  . Sexual activity: Not on file  Other  Topics Concern  . Not on file  Social History Narrative   Has 3 caffeinated bev a day      Exercise: none   Social Determinants of Health   Financial Resource Strain:   . Difficulty of Paying Living Expenses: Not on file  Food Insecurity:   . Worried About Charity fundraiser in the Last Year: Not on file  . Ran Out of Food in the Last Year: Not on file  Transportation Needs:   . Lack of Transportation (Medical): Not on file  . Lack of Transportation (Non-Medical): Not on file  Physical Activity:   . Days of Exercise per Week: Not on file  . Minutes of Exercise per Session: Not on file  Stress:   . Feeling of Stress : Not on file  Social Connections:   . Frequency of Communication with Friends and Family: Not on file  . Frequency of Social Gatherings with Friends and Family: Not on file  . Attends Religious Services: Not on file  . Active Member of Clubs or Organizations: Not on file  . Attends Archivist Meetings: Not on file  . Marital Status: Not on file    Family History  Problem Relation Age of Onset  . Heart failure Mother   . Heart disease Mother   . Hypertension Mother   . Other Mother        varicose veins  . Deep vein thrombosis Mother   . Varicose Veins Mother   . Heart attack Mother   . Hypertension Sister        X 2  . Diabetes Sister   . Hyperlipidemia Sister   . Other Sister        varicose veins  . Heart disease Father   . Diabetes Father   . Hyperlipidemia Father   . Hypertension Father   . Heart attack Father   . Heart disease Brother        MI in late 61s  . Hyperlipidemia Brother   . Heart attack Brother   . Coronary artery disease Other   . Diabetes Other   . Parkinsonism Brother   . Heart  failure Brother   . Hypertension Daughter     Review of Systems  Constitutional: Negative for fever.  Respiratory: Negative for cough, shortness of breath and wheezing.   Cardiovascular: Negative for chest pain, palpitations and leg swelling.  Neurological: Negative for dizziness, light-headedness and headaches.       Objective:   Vitals:   09/07/20 1535  BP: 140/80  Pulse: 82  Temp: 98.1 F (36.7 C)  SpO2: 97%   BP Readings from Last 3 Encounters:  09/07/20 140/80  06/19/20 121/64  05/21/20 120/70   Wt Readings from Last 3 Encounters:  09/07/20 159 lb (72.1 kg)  06/19/20 157 lb 4.8 oz (71.4 kg)  05/21/20 157 lb (71.2 kg)   Body mass index is 27.72 kg/m.   Physical Exam Constitutional:      General: She is not in acute distress.    Appearance: Normal appearance. She is not ill-appearing.  HENT:     Head: Normocephalic and atraumatic.  Cardiovascular:     Rate and Rhythm: Normal rate and regular rhythm.     Heart sounds: No murmur heard.   Pulmonary:     Effort: Pulmonary effort is normal. No respiratory distress.     Breath sounds: No wheezing or rales.  Musculoskeletal:     Right lower leg: No edema.  Left lower leg: No edema.  Skin:    General: Skin is warm and dry.  Neurological:     Mental Status: She is alert.     Gait: Gait abnormal (Slightly unsteady).            Assessment & Plan:    See Problem List for Assessment and Plan of chronic medical problems.    This visit occurred during the SARS-CoV-2 public health emergency.  Safety protocols were in place, including screening questions prior to the visit, additional usage of staff PPE, and extensive cleaning of exam room while observing appropriate contact time as indicated for disinfecting solutions.

## 2020-09-07 ENCOUNTER — Ambulatory Visit (INDEPENDENT_AMBULATORY_CARE_PROVIDER_SITE_OTHER): Payer: PPO | Admitting: Internal Medicine

## 2020-09-07 ENCOUNTER — Other Ambulatory Visit: Payer: Self-pay

## 2020-09-07 ENCOUNTER — Encounter: Payer: Self-pay | Admitting: Internal Medicine

## 2020-09-07 VITALS — BP 140/80 | HR 82 | Temp 98.1°F | Ht 63.5 in | Wt 159.0 lb

## 2020-09-07 DIAGNOSIS — E1159 Type 2 diabetes mellitus with other circulatory complications: Secondary | ICD-10-CM | POA: Diagnosis not present

## 2020-09-07 DIAGNOSIS — W19XXXA Unspecified fall, initial encounter: Secondary | ICD-10-CM | POA: Diagnosis not present

## 2020-09-07 DIAGNOSIS — E559 Vitamin D deficiency, unspecified: Secondary | ICD-10-CM | POA: Insufficient documentation

## 2020-09-07 DIAGNOSIS — N1832 Chronic kidney disease, stage 3b: Secondary | ICD-10-CM

## 2020-09-07 NOTE — Assessment & Plan Note (Addendum)
New problem Last week she had 2 falls One was in her bathroom-she sat on a stool that had closed on it to put her socks on and she slipped off.  The other time she was sweeping her front porch and there is one step going down to the ground and she somehow fell off of it Only mild injuries She did get her stimulator placed for her back a couple of months ago and she does get significant tingling in her legs at times and she wonders if that is the cause I think her poor balance also contributes-she is relatively sedentary and is a little unsteady Elevated sugar could be contributing, but hard to know for sure Doubt other cause given lack of prodromal symptoms Discussed fall prevention

## 2020-09-07 NOTE — Assessment & Plan Note (Signed)
Chronic Taking vitamin d daily-dose increased by Dr. Posey Pronto Check vitamin d level

## 2020-09-07 NOTE — Assessment & Plan Note (Signed)
Chronic Sugars have been high recently-several have been in the 200s in 1 day her sugar was in the 400s-she is not sure why.  Often this occurs after dinner.  Reviewing some common foods that may cause her sugars to be high-she states she has been eating grapes recently.  Discussed this could be one of the causes Discussed other things she can eat for breakfast besides raisin Bran Avoid too many bananas-eat more berries and melon Advised Cheerios or, egg with wheat toast in the morning We will check A1c today We will not make any changes today-continue glipizide 5 mg in the morning and 10 mg in the evening If sugars remain elevated at home her A1c is very high will add Metformin back since her kidney function has improved more Januvia 25 mg daily

## 2020-09-07 NOTE — Assessment & Plan Note (Signed)
Chronic Following with Dr. [facility] Kidney function has improved over the last year CMP today

## 2020-09-07 NOTE — Patient Instructions (Signed)
  Blood work was ordered.     Medications reviewed and updated.  Changes include :   none    Avoid eating grapes and bananas.

## 2020-09-08 LAB — COMPREHENSIVE METABOLIC PANEL
AG Ratio: 1.6 (calc) (ref 1.0–2.5)
ALT: 11 U/L (ref 6–29)
AST: 16 U/L (ref 10–35)
Albumin: 4.2 g/dL (ref 3.6–5.1)
Alkaline phosphatase (APISO): 104 U/L (ref 37–153)
BUN/Creatinine Ratio: 15 (calc) (ref 6–22)
BUN: 39 mg/dL — ABNORMAL HIGH (ref 7–25)
CO2: 23 mmol/L (ref 20–32)
Calcium: 9.4 mg/dL (ref 8.6–10.4)
Chloride: 104 mmol/L (ref 98–110)
Creat: 2.61 mg/dL — ABNORMAL HIGH (ref 0.60–0.93)
Globulin: 2.6 g/dL (calc) (ref 1.9–3.7)
Glucose, Bld: 126 mg/dL — ABNORMAL HIGH (ref 65–99)
Potassium: 3.6 mmol/L (ref 3.5–5.3)
Sodium: 141 mmol/L (ref 135–146)
Total Bilirubin: 0.2 mg/dL (ref 0.2–1.2)
Total Protein: 6.8 g/dL (ref 6.1–8.1)

## 2020-09-08 LAB — HEMOGLOBIN A1C
Hgb A1c MFr Bld: 7.9 % of total Hgb — ABNORMAL HIGH (ref <5.7)
Mean Plasma Glucose: 180 (calc)
eAG (mmol/L): 10 (calc)

## 2020-09-08 LAB — VITAMIN D 25 HYDROXY (VIT D DEFICIENCY, FRACTURES): Vit D, 25-Hydroxy: 32 ng/mL (ref 30–100)

## 2020-09-14 ENCOUNTER — Other Ambulatory Visit: Payer: Self-pay

## 2020-09-14 ENCOUNTER — Ambulatory Visit (INDEPENDENT_AMBULATORY_CARE_PROVIDER_SITE_OTHER): Payer: PPO

## 2020-09-14 ENCOUNTER — Encounter: Payer: Self-pay | Admitting: Internal Medicine

## 2020-09-14 DIAGNOSIS — Z0000 Encounter for general adult medical examination without abnormal findings: Secondary | ICD-10-CM

## 2020-09-14 NOTE — Progress Notes (Signed)
I connected with Sheila Melton today by telephone and verified that I am speaking with the correct person using two identifiers. Location patient: home Location provider: work Persons participating in the virtual visit: Jodeen Mclin and Lisette Abu, LPN.   I discussed the limitations, risks, security and privacy concerns of performing an evaluation and management service by telephone and the availability of in person appointments. I also discussed with the patient that there may be a patient responsible charge related to this service. The patient expressed understanding and verbally consented to this telephonic visit.    Interactive audio and video telecommunications were attempted between this provider and patient, however failed, due to patient having technical difficulties OR patient did not have access to video capability.  We continued and completed visit with audio only.  Some vital signs may be absent or patient reported.   Time Spent with patient on telephone encounter: 30 minutes  Subjective:   Sheila Melton is a 79 y.o. female who presents for Medicare Annual (Subsequent) preventive examination.  Review of Systems    No ROS. Medicare Wellness Visit. Additional risk factors are reflected in social history. Cardiac Risk Factors include: advanced age (>8mn, >>72women);diabetes mellitus;dyslipidemia;family history of premature cardiovascular disease;hypertension Sleep Patterns: No sleep issues, feels rested on waking and sleeps 6-8 hours nightly. Home Safety/Smoke Alarms: Feels safe in home; uses home alarm. Smoke alarms in place. Living environment: 2-story home; Lives with spouse; uses a spinal stimulator; good support system. Seat Belt Safety/Bike Helmet: Wears seat belt.     Objective:    There were no vitals filed for this visit. There is no height or weight on file to calculate BMI.  Advanced Directives 09/14/2020 12/13/2019 02/21/2019 08/18/2017 06/25/2017 02/16/2017 02/12/2017   Does Patient Have a Medical Advance Directive? _0  Yes Yes  Type of AParamedicof AVerlotLiving will H[place]zoniaLiving will [place]Living will HPuget IslandLiving will HPortage LakesLiving will Living will -  Does patient want to make changes to medical advance directive? No - Patient declined No - Patient declined - - - No - Patient declined -  Copy of [place]in Chart? No - copy requested - - - No - copy requested - -    Current Medications (verified) Outpatient Encounter Medications as of 09/14/2020  Medication Sig  . amLODipine (NORVASC) 5 MG tablet TAKE 1 TABLET BY MOUTH TWICE DAILY.  .Marland KitchenAscorbic Acid (VITAMIN C) 250 MG CHEW Chew 1,000 mg by mouth daily.   . B Complex-C (SUPER B COMPLEX PO) Take 1 tablet by mouth daily.   . Blood Glucose Monitoring Suppl (ONE TOUCH ULTRA 2) w/Device KIT Use as advised  . Cholecalciferol (VITAMIN D3) 2000 UNITS TABS Take 2,000 Units by mouth daily.   . diazepam (VALIUM) 5 MG tablet TAKE 1 TABLET AT BEDTIME AS NEEDED.  . fexofenadine (ALLEGRA) 180 MG tablet Take 180 mg by mouth at bedtime.   .Marland KitchenFLUoxetine (PROZAC) 10 MG capsule TAKE 1 CAPSULE DAILY.  . furosemide (LASIX) 40 MG tablet TAKE 1/2 TO 1 TABLET DAILY AS NEEDED FOR FLUID  . glipiZIDE (GLUCOTROL) 5 MG tablet TAKE 1 TABLET IN THE MORNING AND 2 TABLETS IN THE EVENING.  .Marland Kitchenlabetalol (NORMODYNE) 300 MG tablet Take 1 tablet (300 mg total) by mouth 3 (three) times daily.  . Liniments (SALONPAS PAIN RELIEF PATCH EX) Apply 1 patch topically daily as needed (back pain).  .Marland Kitchen  losartan (COZAAR) 100 MG tablet TAKE 1 TABLET ONCE DAILY.  Marland Kitchen ONETOUCH ULTRA test strip CHECK BLOOD SUGAR TWICE DAILY.  Marland Kitchen oxyCODONE-acetaminophen (PERCOCET/ROXICET) 5-325 MG tablet Take 1 tablet by mouth 3 (three) times daily as needed.  . rosuvastatin (CRESTOR) 20 MG tablet TAKE 1 TABLET ONCE DAILY.  .  Triamcinolone Acetonide (NASACORT ALLERGY 24HR NA) Place 1 spray into the nose 2 (two) times daily as needed (congestion).  . vitamin B-12 (CYANOCOBALAMIN) 500 MCG tablet Take 500 mcg by mouth daily.  . Vitamin E 180 MG CAPS Take 180 Units by mouth daily.    No facility-administered encounter medications on file as of 09/14/2020.    Allergies (verified) Rocephin [ceftriaxone sodium in dextrose], Amoxicillin, Captopril, Simvastatin, Ciprofloxacin, Adhesive [tape], and Latex   History: Past Medical History:  Diagnosis Date  . AAA (abdominal aortic aneurysm) (HCC)    BEING WATCHED   VVS. 4.2 cm infrarenal abdominal aortic aneurysm without rupture noted 11/08/2016  . Arthritis   . Cancer ([place])    skin cancer removed from left shoulder  2017  . CAP (community acquired pneumonia) 09/02/13-11/05/13    with SIRS  . Depression    ?? from prednisone    not on pred now  . Diabetes mellitus    type ii    long time ago.  . Family history of anesthesia complication    PATIENTS MOM HAD TROUBLE WAKING UP   . GERD (gastroesophageal reflux disease)    will take occasional tums  . Heart murmur    still has.   Sees Dr. Stanford Breed  . History of kidney stones    has them at the present time  . Hyperlipidemia   . Hypertension    unspecified essential  . Renal insufficiency    "FSGS"  . Subclavian steal syndrome    LEFT SIDE   NO SIDE EFFECTS  . Subclavian steal syndrome --  left    85% blocked  . UTI (lower urinary tract infection) 08/26/13   Enterococcus and Escherichia coli both sensitive to nitrofurantoin   Past Surgical History:  Procedure Laterality Date  . ABDOMINAL HYSTERECTOMY    . BACK SURGERY    . CATARACT EXTRACTION     OS  . CYST EXCISION Right 05-23-13   Right index finger: cyst  . DILATION AND CURETTAGE OF UTERUS    . EYE SURGERY    . g1 p1    . SPINE SURGERY  Sept. 2013  . VIDEO ASSISTED THORACOSCOPY (VATS)/ LOBECTOMY Right 02/16/2017   Procedure: Right VIDEO ASSISTED  THORACOSCOPY (VATS)/ Right Middle LOBECTOMY;  Surgeon: [name], MD;  Location: [facility];  Service: Thoracic;  Laterality: Right;   Family History  Problem Relation Age of Onset  . Heart failure Mother   . Heart disease Mother   . Hypertension Mother   . Other Mother        varicose veins  . Deep vein thrombosis Mother   . Varicose Veins Mother   . Heart attack Mother   . Hypertension Sister        X 2  . Diabetes Sister   . Hyperlipidemia Sister   . Other Sister        varicose veins  . Heart disease Father   . Diabetes Father   . Hyperlipidemia Father   . Hypertension Father   . Heart attack Father   . Heart disease Brother        MI in late 18s  . Hyperlipidemia Brother   .  Heart attack Brother   . Coronary artery disease Other   . Diabetes Other   . Parkinsonism Brother   . Heart failure Brother   . Hypertension Daughter    Social History   Socioeconomic History  . Marital status: Married    Spouse name: Not on file  . Number of children: 1  . Years of education: Not on file  . Highest education level: Not on file  Occupational History  . Not on file  Tobacco Use  . Smoking status: Light Tobacco Smoker    Packs/day: 1.00    Years: 50.00    Pack years: 50.00    Types: E-cigarettes  . Smokeless tobacco: Never Used  . Tobacco comment: e-cigs  Vaping Use  . Vaping Use: Never used  Substance and Sexual Activity  . Alcohol use: No  . Drug use: No  . Sexual activity: Not on file  Other Topics Concern  . Not on file  Social History Narrative   Has 3 caffeinated bev a day      Exercise: none   Social Determinants of Health   Financial Resource Strain: Low Risk   . Difficulty of Paying Living Expenses: Not hard at all  Food Insecurity: No Food Insecurity  . Worried About Charity fundraiser in the Last Year: Never true  . Ran Out of Food in the Last Year: Never true  Transportation Needs: No Transportation Needs  . Lack of Transportation  (Medical): No  . Lack of Transportation (Non-Medical): No  Physical Activity: Inactive  . Days of Exercise per Week: 0 days  . Minutes of Exercise per Session: 0 min  Stress: No Stress Concern Present  . Feeling of Stress : Not at all  Social Connections: Socially Integrated  . Frequency of Communication with Friends and Family: More than three times a week  . Frequency of Social Gatherings with Friends and Family: Once a week  . Attends Religious Services: More than 4 times per year  . Active Member of Clubs or Organizations: No  . Attends Archivist Meetings: More than 4 times per year  . Marital Status: Married    Tobacco Counseling Ready to quit: Not Answered Counseling given: Not Answered Comment: e-cigs   Clinical Intake:  Pre-visit preparation completed: Yes  Pain : No/denies pain     Nutritional Risks: None Diabetes: Yes CBG done?: No Did pt. bring in CBG monitor from home?: No  How often do you need to have someone help you when you read instructions, pamphlets, or other written materials from your doctor or pharmacy?: 1 - Never What is the last grade level you completed in school?: HSG  Diabetic? yes  Interpreter Needed?: No  Information entered by :: Lisette Abu, LPN   Activities of Daily Living In your present state of health, do you have any difficulty performing the following activities: 09/14/2020 09/07/2020  Hearing? N N  Vision? N N  Difficulty concentrating or making decisions? N N  Walking or climbing stairs? Y Y  Dressing or bathing? N N  Doing errands, shopping? N N  Preparing Food and eating ? N -  Using the Toilet? N -  In the past six months, have you accidently leaked urine? N -  Do you have problems with loss of bowel control? N -  Managing your Medications? N -  Managing your Finances? N -  Housekeeping or managing your Housekeeping? N -  Some recent data might be hidden  Patient Care Team: Binnie Rail, MD  as PCP - General (Internal Medicine) Wellington Hampshire, MD as PCP - Cardiology (Cardiology) Elmarie Shiley, MD as Consulting Physician (Nephrology) Charlton Haws, [facility] as Pharmacist (Pharmacist)  Indicate any recent Medical Services you may have received from other than [facility] providers in the past year (date may be approximate).     Assessment:   This is a routine wellness examination for Sheila Melton.  Hearing/Vision screen No exam data present  Dietary issues and exercise activities discussed: Current Exercise Habits: The patient does not participate in regular exercise at present, Exercise limited by: psychological condition(s);orthopedic condition(s)  Goals    . Pharmacy Care Plan     CARE PLAN ENTRY  Current Barriers:  . Chronic Disease Management support, education, and care coordination needs related to Hypertension, Hyperlipidemia, and Diabetes   Hypertension BP Readings from Last 3 Encounters:  12/13/19 129/73  11/21/19 132/70  08/24/19 (!) 150/68 .  Pharmacist Clinical Goal(s): o Over the next 30 days, patient will work with PharmD and providers to maintain BP goal <130/80 . Current regimen:  o amlodipine 5 mg BID,  o losartan 100 mg daily,  o labetalol 300 mg TID,  o furosemide 40 mg PRN . Interventions: o Discussed BP goal and benefits of medication for kidney protection and prevention of heart attack/stroke . Patient self care activities - Over the next 30 days, patient will: o Check BP 3-5 times per week, document, and provide at future appointments o Ensure daily salt intake < 2300 mg/day  Hyperlipidemia Lipid Panel     Component Value Date/Time   CHOL 121 11/21/2019 1459   TRIG 294.0 (H) 11/21/2019 1459   HDL 39.30 11/21/2019 1459   LDLDIRECT 43.0 11/21/2019 1459 .  Pharmacist Clinical Goal(s): o Over the next 30 days, patient will work with PharmD and providers to maintain LDL goal < 70 . Current regimen:  o Rosuvastatin 20 mg  daily . Interventions: o Discussed benefits of statin for prevention of heart attack and stroke . Patient self care activities - Over the next 30 days, patient will: o Continue medication as prescribed o Continue low cholesterol diet  Diabetes Lab Results  Component Value Date/Time   HGBA1C 6.6 (H) 11/21/2019 02:59 PM   HGBA1C 6.5 (A) 05/18/2019 03:22 PM   HGBA1C 6.8 (H) 11/17/2018 03:03 PM .  Pharmacist Clinical Goal(s): o Over the next 30 days, patient will work with PharmD and providers to maintain A1c goal <7% . Current regimen:  o Glipizide 5 mg in AM and 10 mg in PM with meals . Interventions: o Discussed metformin should be discontinued when GFR is less than 30 due to inability to clear the drug from the body appropriately, which increases risk for side effects o Recommend to stop metformin and continue checking sugar in the morning . Patient self care activities - Over the next 30 days, patient will: o Check blood sugar once daily and in the morning before eating or drinking, document, and provide at future appointments o Contact provider with any episodes of hypoglycemia o Stop metformin  Medication management . Pharmacist Clinical Goal(s): o Over the next 30 days, patient will work with PharmD and providers to maintain optimal medication adherence . [facility] . Interventions o Comprehensive medication review performed. o Continue current medication management strategy . Patient self care activities - Over the next 30 days, patient will: o Focus on medication adherence by pill box o Take medications as  prescribed o Report any questions or concerns to PharmD and/or provider(s)  Please see past updates related to this goal by clicking on the "Past Updates" button in the selected goal       Depression Screen PHQ 2/9 Scores 09/14/2020 09/07/2020 11/17/2018 06/25/2017 11/19/2016 11/22/2013  PHQ - 2 Score 0 0 0 1 0 0  PHQ- 9 Score - - - 2 - -     Fall Risk Fall Risk  09/14/2020 11/21/2019 10/05/2019 11/17/2018 06/25/2017  Falls in the past year? 1 0 0 0 No  Comment - - Emmi Telephone Survey: data to providers prior to load - -  Number falls in past yr: 1 0 - 0 -  Injury with Fall? 0 - - - -  Risk for fall due to : History of fall(s) - - - -  Follow up Falls evaluation completed - - - -    Any stairs in or around the home? Yes  If so, are there any without handrails? No  Home free of loose throw rugs in walkways, pet beds, electrical cords, etc? Yes  Adequate lighting in your home to reduce risk of falls? Yes   ASSISTIVE DEVICES UTILIZED TO PREVENT FALLS:  Life alert? No  Use of a cane, walker or w/c? No  Grab bars in the bathroom? Yes  Shower chair or bench in shower? Yes  Elevated toilet seat or a handicapped toilet? Yes   TIMED UP AND GO:  Was the test performed? No .  Length of time to ambulate 10 feet: 0 sec.   Gait steady and fast without use of assistive device  Cognitive Function: Patient is cogitatively intact.        Immunizations Immunization History  Administered Date(s) Administered  . Fluad Quad(high Dose 65+) 08/24/2019, 08/21/2020  . Influenza Split 08/28/2011, 09/07/2012  . Influenza Whole 09/06/2008, 08/30/2009, 10/11/2010  . Influenza, High Dose Seasonal PF 08/24/2013, 08/18/2014, 08/07/2015, 08/08/2016, 07/31/2017, 08/10/2018  . PFIZER SARS-COV-2 Vaccination 11/29/2019, 12/19/2019  . Pneumococcal Conjugate-13 09/26/2015  . Pneumococcal Polysaccharide-23 12/10/2009    TDAP status: Due, Education has been provided regarding the importance of this vaccine. Advised may receive this vaccine at local pharmacy or Health Dept. Aware to provide a copy of the vaccination record if obtained from local pharmacy or Health Dept. Verbalized acceptance and understanding. Flu Vaccine status: Up to date Pneumococcal vaccine status: Up to date Covid-19 vaccine status: Completed vaccines  Qualifies for Shingles  Vaccine? Yes   Zostavax completed No   Shingrix Completed?: No.    Education has been provided regarding the importance of this vaccine. Patient has been advised to call insurance company to determine out of pocket expense if they have not yet received this vaccine. Advised may also receive vaccine at local pharmacy or Health Dept. Verbalized acceptance and understanding.  Screening Tests Health Maintenance  Topic Date Due  . Hepatitis C Screening  Never done  . TETANUS/TDAP  Never done  . DEXA SCAN  12/18/2018  . OPHTHALMOLOGY EXAM  09/05/2020  . HEMOGLOBIN A1C  03/08/2021  . FOOT EXAM  05/21/2021  . INFLUENZA VACCINE  Completed  . COVID-19 Vaccine  Completed  . PNA vac Low Risk Adult  Completed    Health Maintenance  Health Maintenance Due  Topic Date Due  . Hepatitis C Screening  Never done  . TETANUS/TDAP  Never done  . DEXA SCAN  12/18/2018  . OPHTHALMOLOGY EXAM  09/05/2020    Colorectal cancer screening: No longer required.  Mammogram status: No longer required.  Bone Density status: Completed 12/18/2016. Results reflect: Bone density results: OSTEOPOROSIS. Repeat every 2 years.  Lung Cancer Screening: (Low Dose CT Chest recommended if Age 6-80 years, 30 pack-year currently smoking OR have quit w/in 15years.) does qualify.   Lung Cancer Screening Referral: no  Additional Screening:  Hepatitis C Screening: does qualify; Completed no  Vision Screening: Recommended annual ophthalmology exams for early detection of glaucoma and other disorders of the eye. Is the patient up to date with their annual eye exam?  Yes  Who is the provider or what is the name of the office in which the patient attends annual eye exams? Alois Cliche, MD If pt is not established with a provider, would they like to be referred to a provider to establish care? No .   Dental Screening: Recommended annual dental exams for proper oral hygiene  Community Resource Referral / Chronic Care  Management: CRR required this visit?  No   CCM required this visit?  No      Plan:     I have personally reviewed and noted the following in the patient's chart:   . Medical and social history . Use of alcohol, tobacco or illicit drugs  . Current medications and supplements . Functional ability and status . Nutritional status . Physical activity . Advanced directives . List of other physicians . Hospitalizations, surgeries, and ER visits in previous 12 months . Vitals . Screenings to include cognitive, depression, and falls . Referrals and appointments  In addition, I have reviewed and discussed with patient certain preventive protocols, quality metrics, and best practice recommendations. A written personalized care plan for preventive services as well as general preventive health recommendations were provided to patient.     Sheral Flow, LPN   [id number]/12/[id number]   Nurse Notes:  Patient is cogitatively intact. There were no vitals filed for this visit. There is no height or weight on file to calculate BMI. Patient stated that she has no issues with gait or balance; does not use any assistive devices.

## 2020-09-14 NOTE — Patient Instructions (Addendum)
Sheila Melton , Thank you for taking time to come for your Medicare Wellness Visit. I appreciate your ongoing commitment to your health goals. Please review the following plan we discussed and let me know if I can assist you in the future.   Screening recommendations/referrals: Colonoscopy: 08/02/2010; no repeat due to age 79: no repeat due to age Bone Density: no repeat due to age Recommended yearly ophthalmology/optometry visit for glaucoma screening and checkup Recommended yearly dental visit for hygiene and checkup  Vaccinations: Influenza vaccine: 08/21/2020 Pneumococcal vaccine: up to date Tdap vaccine: never done Shingles vaccine: never done; will check with local pharmacy   Covid-19: up to date  Advanced directives: Please bring a copy of your health care power of attorney and living will to the office at your convenience.  Conditions/risks identified: Yes; Reviewed health maintenance screenings with patient today and relevant education, vaccines, and/or referrals were provided. Please continue to do your personal lifestyle choices by: daily care of teeth and gums, regular physical activity (goal should be 5 days a week for 30 minutes), eat a healthy diet, avoid tobacco and drug use, limiting any alcohol intake, taking a low-dose aspirin (if not allergic or have been advised by your provider otherwise) and taking vitamins and minerals as recommended by your provider. Continue doing brain stimulating activities (puzzles, reading, adult coloring books, staying active) to keep memory sharp. Continue to eat heart healthy diet (full of fruits, vegetables, whole grains, lean protein, water--limit salt, fat, and sugar intake) and increase physical activity as tolerated.  Next appointment: Please schedule your next Medicare Wellness Visit with your Nurse Health Advisor in 1 year by calling [phone].  Preventive Care 15 Years and Older, Female Preventive care refers to lifestyle choices  and visits with your health care provider that can promote health and wellness. What does preventive care include?  A yearly physical exam. This is also called an annual well check.  Dental exams once or twice a year.  Routine eye exams. Ask your health care provider how often you should have your eyes checked.  Personal lifestyle choices, including:  Daily care of your teeth and gums.  Regular physical activity.  Eating a healthy diet.  Avoiding tobacco and drug use.  Limiting alcohol use.  Practicing safe sex.  Taking low-dose aspirin every day.  Taking vitamin and mineral supplements as recommended by your health care provider. What happens during an annual well check? The services and screenings done by your health care provider during your annual well check will depend on your age, overall health, lifestyle risk factors, and family history of disease. Counseling  Your health care provider may ask you questions about your:  Alcohol use.  Tobacco use.  Drug use.  Emotional well-being.  Home and relationship well-being.  Sexual activity.  Eating habits.  History of falls.  Memory and ability to understand (cognition).  Work and work Statistician.  Reproductive health. Screening  You may have the following tests or measurements:  Height, weight, and BMI.  Blood pressure.  Lipid and cholesterol levels. These may be checked every 5 years, or more frequently if you are over 77 years old.  Skin check.  Lung cancer screening. You may have this screening every year starting at age 26 if you have a 30-pack-year history of smoking and currently smoke or have quit within the past 15 years.  Fecal occult blood test (FOBT) of the stool. You may have this test every year starting at age 67.  Flexible  sigmoidoscopy or colonoscopy. You may have a sigmoidoscopy every 5 years or a colonoscopy every 10 years starting at age 15.  Hepatitis C blood test.  Hepatitis  B blood test.  Sexually transmitted disease (STD) testing.  Diabetes screening. This is done by checking your blood sugar (glucose) after you have not eaten for a while (fasting). You may have this done every 1-3 years.  Bone density scan. This is done to screen for osteoporosis. You may have this done starting at age 38.  Mammogram. This may be done every 1-2 years. Talk to your health care provider about how often you should have regular mammograms. Talk with your health care provider about your test results, treatment options, and if necessary, the need for more tests. Vaccines  Your health care provider may recommend certain vaccines, such as:  Influenza vaccine. This is recommended every year.  Tetanus, diphtheria, and acellular pertussis (Tdap, Td) vaccine. You may need a Td booster every 10 years.  Zoster vaccine. You may need this after age 71.  Pneumococcal 13-valent conjugate (PCV13) vaccine. One dose is recommended after age 59.  Pneumococcal polysaccharide (PPSV23) vaccine. One dose is recommended after age 50. Talk to your health care provider about which screenings and vaccines you need and how often you need them. This information is not intended to replace advice given to you by your health care provider. Make sure you discuss any questions you have with your health care provider. Document Released: 11/23/2015 Document Revised: 07/16/2016 Document Reviewed: 08/28/2015 Elsevier Interactive Patient Education  2017 [employer] Prevention in the Home Falls can cause injuries. They can happen to people of all ages. There are many things you can do to make your home safe and to help prevent falls. What can I do on the outside of my home?  Regularly fix the edges of walkways and driveways and fix any cracks.  Remove anything that might make you trip as you walk through a door, such as a raised step or threshold.  Trim any bushes or trees on the path to your  home.  Use bright outdoor lighting.  Clear any walking paths of anything that might make someone trip, such as rocks or tools.  Regularly check to see if handrails are loose or broken. Make sure that both sides of any steps have handrails.  Any raised decks and porches should have guardrails on the edges.  Have any leaves, snow, or ice cleared regularly.  Use sand or salt on walking paths during winter.  Clean up any spills in your garage right away. This includes oil or grease spills. What can I do in the bathroom?  Use night lights.  Install grab bars by the toilet and in the tub and shower. Do not use towel bars as grab bars.  Use non-skid mats or decals in the tub or shower.  If you need to sit down in the shower, use a plastic, non-slip stool.  Keep the floor dry. Clean up any water that spills on the floor as soon as it happens.  Remove soap buildup in the tub or shower regularly.  Attach bath mats securely with double-sided non-slip rug tape.  Do not have throw rugs and other things on the floor that can make you trip. What can I do in the bedroom?  Use night lights.  Make sure that you have a light by your bed that is easy to reach.  Do not use any sheets or blankets that  are too big for your bed. They should not hang down onto the floor.  Have a firm chair that has side arms. You can use this for support while you get dressed.  Do not have throw rugs and other things on the floor that can make you trip. What can I do in the kitchen?  Clean up any spills right away.  Avoid walking on wet floors.  Keep items that you use a lot in easy-to-reach places.  If you need to reach something above you, use a strong step stool that has a grab bar.  Keep electrical cords out of the way.  Do not use floor polish or wax that makes floors slippery. If you must use wax, use non-skid floor wax.  Do not have throw rugs and other things on the floor that can make you  trip. What can I do with my stairs?  Do not leave any items on the stairs.  Make sure that there are handrails on both sides of the stairs and use them. Fix handrails that are broken or loose. Make sure that handrails are as long as the stairways.  Check any carpeting to make sure that it is firmly attached to the stairs. Fix any carpet that is loose or worn.  Avoid having throw rugs at the top or bottom of the stairs. If you do have throw rugs, attach them to the floor with carpet tape.  Make sure that you have a light switch at the top of the stairs and the bottom of the stairs. If you do not have them, ask someone to add them for you. What else can I do to help prevent falls?  Wear shoes that:  Do not have high heels.  Have rubber bottoms.  Are comfortable and fit you well.  Are closed at the toe. Do not wear sandals.  If you use a stepladder:  Make sure that it is fully opened. Do not climb a closed stepladder.  Make sure that both sides of the stepladder are locked into place.  Ask someone to hold it for you, if possible.  Clearly mark and make sure that you can see:  Any grab bars or handrails.  First and last steps.  Where the edge of each step is.  Use tools that help you move around (mobility aids) if they are needed. These include:  Canes.  Walkers.  Scooters.  Crutches.  Turn on the lights when you go into a dark area. Replace any light bulbs as soon as they burn out.  Set up your furniture so you have a clear path. Avoid moving your furniture around.  If any of your floors are uneven, fix them.  If there are any pets around you, be aware of where they are.  Review your medicines with your doctor. Some medicines can make you feel dizzy. This can increase your chance of falling. Ask your doctor what other things that you can do to help prevent falls. This information is not intended to replace advice given to you by your health care provider. Make  sure you discuss any questions you have with your health care provider. Document Released: 08/23/2009 Document Revised: 04/03/2016 Document Reviewed: 12/01/2014 Elsevier Interactive Patient Education  2017 [employer].

## 2020-09-18 DIAGNOSIS — H40013 Open angle with borderline findings, low risk, bilateral: Secondary | ICD-10-CM | POA: Diagnosis not present

## 2020-09-18 DIAGNOSIS — E119 Type 2 diabetes mellitus without complications: Secondary | ICD-10-CM | POA: Diagnosis not present

## 2020-09-21 ENCOUNTER — Telehealth: Payer: Self-pay | Admitting: Pharmacist

## 2020-09-21 NOTE — Progress Notes (Signed)
Chronic Care Management Pharmacy Assistant   Name: ELYSHIA KUMAGAI  MRN: [id number] DOB: [birth year]  Reason for Encounter: Diabetic Adherence Call   PCP : Binnie Rail, MD  Allergies:   Allergies  Allergen Reactions  . Rocephin [Ceftriaxone Sodium In Dextrose] Dermatitis and Rash    10/24 /14 blisters of palms & diffuse rash Because of a history of documented adverse serious drug reaction;Medi Alert bracelet  is recommended  . Amoxicillin Rash    Has patient had a PCN reaction causing IMMEDIATE RASH, FACIAL/TONGUE/THROAT SWELLING, SOB, OR LIGHTHEADEDNESS WITH HYPOTENSION:  #  #  #  YES  #  #  #  Has patient had a PCN reaction causing severe rash involving mucus membranes or skin necrosis: No Has patient had a PCN reaction that required hospitalization No Has patient had a PCN reaction occurring within the last 10 years: No If all of the above answers are "NO", then may proceed with Cephalosporin use.   . Captopril Cough  . Simvastatin Other (See Comments)    MYALGIAS BUTTOCKS CRAMP  . Ciprofloxacin Other (See Comments)    Sores in mouth   . Adhesive [Tape] Rash  . Latex Rash    Patient states she gets red and a rash when latex touches her.    Medications: Outpatient Encounter Medications as of 09/21/2020  Medication Sig  . amLODipine (NORVASC) 5 MG tablet TAKE 1 TABLET BY MOUTH TWICE DAILY.  Marland Kitchen Ascorbic Acid (VITAMIN C) 250 MG CHEW Chew 1,000 mg by mouth daily.   . B Complex-C (SUPER B COMPLEX PO) Take 1 tablet by mouth daily.   . Blood Glucose Monitoring Suppl (ONE TOUCH ULTRA 2) w/Device KIT Use as advised  . Cholecalciferol (VITAMIN D3) 2000 UNITS TABS Take 2,000 Units by mouth daily.   . diazepam (VALIUM) 5 MG tablet TAKE 1 TABLET AT BEDTIME AS NEEDED.  . fexofenadine (ALLEGRA) 180 MG tablet Take 180 mg by mouth at bedtime.   Marland Kitchen FLUoxetine (PROZAC) 10 MG capsule TAKE 1 CAPSULE DAILY.  . furosemide (LASIX) 40 MG tablet TAKE 1/2 TO 1 TABLET DAILY AS NEEDED FOR FLUID    . glipiZIDE (GLUCOTROL) 5 MG tablet TAKE 1 TABLET IN THE MORNING AND 2 TABLETS IN THE EVENING.  Marland Kitchen labetalol (NORMODYNE) 300 MG tablet Take 1 tablet (300 mg total) by mouth 3 (three) times daily.  . Liniments (SALONPAS PAIN RELIEF PATCH EX) Apply 1 patch topically daily as needed (back pain).  Marland Kitchen losartan (COZAAR) 100 MG tablet TAKE 1 TABLET ONCE DAILY.  Marland Kitchen ONETOUCH ULTRA test strip CHECK BLOOD SUGAR TWICE DAILY.  Marland Kitchen oxyCODONE-acetaminophen (PERCOCET/ROXICET) 5-325 MG tablet Take 1 tablet by mouth 3 (three) times daily as needed.  . rosuvastatin (CRESTOR) 20 MG tablet TAKE 1 TABLET ONCE DAILY.  . Triamcinolone Acetonide (NASACORT ALLERGY 24HR NA) Place 1 spray into the nose 2 (two) times daily as needed (congestion).  . vitamin B-12 (CYANOCOBALAMIN) 500 MCG tablet Take 500 mcg by mouth daily.  . Vitamin E 180 MG CAPS Take 180 Units by mouth daily.    No facility-administered encounter medications on file as of 09/21/2020.    Current Diagnosis: Patient Active Problem List   Diagnosis Date Noted  . Vitamin D deficiency 09/07/2020  . Falls 09/07/2020  . Constipation 11/17/2018  . Skin yeast infection 02/26/2017  . 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 Osteoporosis 12/25/2016  . Depression 08/15/2016  . 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 CKD (chronic kidney disease) 05/21/2016  .  Sleep difficulties 01/18/2016  . Diabetes mellitus ([place]) 10/23/2015  . Numbness of toes-Right foot 06/26/2015  . Colitis 03/19/2015  . Nephrolithiasis 03/19/2015  . 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 Anxiety state 03/19/2015  . Skin cancer 01/09/2015  . Former smoker 05/24/2014  . Bilateral renal cysts 09/16/2012  . 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 Degenerative spondylolisthesis 07/26/2012  . SHOULDER PAIN, LEFT, CHRONIC 07/25/2009  . CARDIAC MURMUR, SYSTOLIC 60/45/4098  . Mixed hyperlipidemia 12/30/2007  . Tobacco use  disorder 12/30/2007  . Essential hypertension 12/30/2007  . Subclavian steal syndrome 12/30/2007  . CAROTID BRUIT 04/07/2007    Goals Addressed   None     Follow-Up:  Pharmacist Review    Recent Relevant Labs: Lab Results  Component Value Date/Time   HGBA1C 7.9 (H) 09/07/2020 04:19 PM   HGBA1C 6.9 (H) 05/21/2020 02:37 PM   MICROALBUR 194.0 Verified by manual dilution. (H) 11/16/2014 12:05 PM   MICROALBUR 95.5 (H) 05/09/2014 11:28 AM    Kidney Function Lab Results  Component Value Date/Time   CREATININE 2.61 (H) 09/07/2020 04:19 PM   CREATININE 2.60 (H) 05/21/2020 02:37 PM   GFR 23.89 (L) 11/21/2019 02:59 PM   GFRNONAA 46 (L) 02/19/2017 02:27 AM   GFRAA 53 (L) 02/19/2017 02:27 AM    . Current antihyperglycemic regimen: The patient is taking Glipizide  . What recent interventions/DTPs have been made to improve glycemic control: The patient states that Dr. Quay Burow was wanting to change glipizide to [facility] but the patient does not feel like she should change. She read that both meds were basically the same and she would rather stay on the glipizide  . Have there been any recent hospitalizations or ED visits since last visit with CPP? None  . Patient denies hypoglycemic symptoms   . Patient reports having a high blood sugar eating grapes and did not know that it raised her blood sugar. She did stop eating the grapes and after a few days blood sugar was back to normal. The patient  hyperglycemic symptoms, including falling asleep not able to stay awake. When she checked it was up at 7.9 after few day it went back to normal.  . How often are you checking your blood sugar? The patient only checks her blood sugar in the morning before she eats  . What are your blood sugars ranging?  o Fasting: 146 this morning 136   120 o Before meals: NA o After meals: NA o Bedtime: NA . During the week, how often does your blood glucose drop below 70? No   . Are you checking your feet  daily/regularly? The patient states that she does check her feet daily for any cuts, blisters, tingling or numbness and every thing is fine  Adherence Review: Is the patient currently on a STATIN medication? Rosuvastatin Is the patient currently on ACE/ARB medication? Losartan Does the patient have >5 day gap between last estimated fill dates? No   Wendy Poet, Clinical Pharmacist Assistant [facility]

## 2020-09-28 LAB — HM DIABETES EYE EXAM

## 2020-09-29 ENCOUNTER — Other Ambulatory Visit: Payer: Self-pay | Admitting: Internal Medicine

## 2020-10-03 DIAGNOSIS — M5416 Radiculopathy, lumbar region: Secondary | ICD-10-CM | POA: Diagnosis not present

## 2020-10-03 DIAGNOSIS — M47817 Spondylosis without myelopathy or radiculopathy, lumbosacral region: Secondary | ICD-10-CM | POA: Diagnosis not present

## 2020-10-29 LAB — HM DIABETES EYE EXAM

## 2020-10-31 ENCOUNTER — Telehealth: Payer: PPO

## 2020-10-31 NOTE — Chronic Care Management (AMB) (Deleted)
Chronic Care Management Pharmacy  Name: Sheila Melton  MRN: [id number] DOB: 1941-02-01   Chief Complaint/ HPI  Sheila Melton,  79 y.o. , female presents for their Follow-Up CCM visit with the clinical pharmacist via telephone. Pt's husband is [name], also enrolled in CCM services.  PCP : Binnie Rail, MD  Their chronic conditions include: Hypertension, Hyperlipidemia, Diabetes, Chronic Kidney Disease, Depression, Anxiety and Osteoporosis  Office Visits:  09/07/20 Dr Quay Burow OV: acute visit for falls, high BG. Advised diet modifications - avoid bananas. A1c 7.9%, advised to add Januvia, pt had questions and Januvia was not added.  11/21/19 Dr Quay Burow OV: pt saw Dr Annette Stable for back pain, started hydrocodone. Pt also has tramadol - instructed to take one or the other. Dr Posey Pronto dc'd Prograf d/t elevated sugars, pt has f/u shortly.  Consult Visit: 04/12/20: Temporary SCS placed. Removal 6/10.  03/29/20 Dr Carlis Abbott (pain mgmt): psych evaluation for spinal cord stimulation surgery; cleared for SCS.  12/13/19 Dr Early (Vasc Surg): AAA follow up, no changes, continue observation.  Allergies  Allergen Reactions  . Rocephin [Ceftriaxone Sodium In Dextrose] Dermatitis and Rash    10/24 /14 blisters of palms & diffuse rash Because of a history of documented adverse serious drug reaction;Medi Alert bracelet  is recommended  . Amoxicillin Rash    Has patient had a PCN reaction causing IMMEDIATE RASH, FACIAL/TONGUE/THROAT SWELLING, SOB, OR LIGHTHEADEDNESS WITH HYPOTENSION:  #  #  #  YES  #  #  #  Has patient had a PCN reaction causing severe rash involving mucus membranes or skin necrosis: No Has patient had a PCN reaction that required hospitalization No Has patient had a PCN reaction occurring within the last 10 years: No If all of the above answers are "NO", then may proceed with Cephalosporin use.   . Captopril Cough  . Simvastatin Other (See Comments)    MYALGIAS BUTTOCKS CRAMP  .  Ciprofloxacin Other (See Comments)    Sores in mouth   . Adhesive [Tape] Rash  . Latex Rash    Patient states she gets red and a rash when latex touches her.    Medications: Outpatient Encounter Medications as of 10/31/2020  Medication Sig  . amLODipine (NORVASC) 5 MG tablet TAKE 1 TABLET BY MOUTH TWICE DAILY.  Marland Kitchen Ascorbic Acid (VITAMIN C) 250 MG CHEW Chew 1,000 mg by mouth daily.   . B Complex-C (SUPER B COMPLEX PO) Take 1 tablet by mouth daily.   . Blood Glucose Monitoring Suppl (ONE TOUCH ULTRA 2) w/Device KIT Use as advised  . Cholecalciferol (VITAMIN D3) 2000 UNITS TABS Take 2,000 Units by mouth daily.   . diazepam (VALIUM) 5 MG tablet TAKE 1 TABLET AT BEDTIME AS NEEDED.  . fexofenadine (ALLEGRA) 180 MG tablet Take 180 mg by mouth at bedtime.   Marland Kitchen FLUoxetine (PROZAC) 10 MG capsule TAKE 1 CAPSULE DAILY.  . furosemide (LASIX) 40 MG tablet TAKE 1/2 TO 1 TABLET DAILY AS NEEDED FOR FLUID  . glipiZIDE (GLUCOTROL) 5 MG tablet TAKE 1 TABLET IN THE MORNING AND 2 TABLETS IN THE EVENING.  Marland Kitchen labetalol (NORMODYNE) 300 MG tablet Take 1 tablet (300 mg total) by mouth 3 (three) times daily.  . Liniments (SALONPAS PAIN RELIEF PATCH EX) Apply 1 patch topically daily as needed (back pain).  Marland Kitchen losartan (COZAAR) 100 MG tablet TAKE 1 TABLET ONCE DAILY.  Marland Kitchen ONETOUCH ULTRA test strip CHECK BLOOD SUGAR TWICE DAILY.  Marland Kitchen oxyCODONE-acetaminophen (PERCOCET/ROXICET) 5-325 MG tablet Take  1 tablet by mouth 3 (three) times daily as needed.  . rosuvastatin (CRESTOR) 20 MG tablet TAKE 1 TABLET ONCE DAILY.  . Triamcinolone Acetonide (NASACORT ALLERGY 24HR NA) Place 1 spray into the nose 2 (two) times daily as needed (congestion).  . vitamin B-12 (CYANOCOBALAMIN) 500 MCG tablet Take 500 mcg by mouth daily.  . Vitamin E 180 MG CAPS Take 180 Units by mouth daily.    No facility-administered encounter medications on file as of 10/31/2020.   Wt Readings from Last 3 Encounters:  09/07/20 159 lb (72.1 kg)  06/19/20 157 lb  4.8 oz (71.4 kg)  05/21/20 157 lb (71.2 kg)   Lab Results  Component Value Date   CREATININE 2.61 (H) 09/07/2020   BUN 39 (H) 09/07/2020   GFR 23.89 (L) 11/21/2019   GFRNONAA 46 (L) 02/19/2017   GFRAA 53 (L) 02/19/2017   NA 141 09/07/2020   K 3.6 09/07/2020   CALCIUM 9.4 09/07/2020   CO2 23 09/07/2020     Current Diagnosis/Assessment:  Goals Addressed   None      Diabetes   A1c goal < 8%  Recent Relevant Labs: Lab Results  Component Value Date/Time   HGBA1C 7.9 (H) 09/07/2020 04:19 PM   HGBA1C 6.9 (H) 05/21/2020 02:37 PM   MICROALBUR 194.0 Verified by manual dilution. (H) 11/16/2014 12:05 PM   MICROALBUR 95.5 (H) 05/09/2014 11:28 AM    Last diabetic foot exam: No results found for: HMDIABFOOTEX   Checking BG: Daily  Recent FBG Readings: ***  Patient has failed these meds in past: Lantus, pioglitazone, metformin (stopped 04/12/20 d/t GFR < 30), Januvia -pt was started on Prograf Oct 2020 and sugars climbed to 200-300s, started Lantus at that time to manage. Once Prograf was dc'd few months later, sugars went back down and taken off insulin.  Patient is currently controlled on the following medications:   glipizide 5 qAM and 10 mg qPM with meals  We discussed: pt stopped taking metformin 04/12/20 due to GFR < 30. Fasting BG over the past week has been 120s-130s, only 1 high reading 160 this morning. Pt has f/u appt with PCP in 1 month.  Plan  Continue current medication  Hypertension   BP goal is:  <130/80  Office blood pressures are  BP Readings from Last 3 Encounters:  09/07/20 140/80  06/19/20 121/64  05/21/20 120/70   Patient checks BP at home 3-5x per week  Patient home BP readings are ranging: SBP 120s-130s  Patient has failed these meds in the past: valsartan, spironolactone Patient is currently controlled on the following medications:   amlodipine 5 mg BID,   losartan 100 mg daily,   labetalol 300 mg TID,   furosemide 40 mg PRN  We  discussed: pt reports she has taken medication for BP since she was 30, long history of HTN may be contributing to her current kidney issues. Pt reports compliance with meds as prescribed.  Plan  Continue current medications and control with diet and exercise   Hyperlipidemia   LDL goal < 100  Last lipids Lab Results  Component Value Date   CHOL 142 05/21/2020   HDL 36 (L) 05/21/2020   LDLCALC 70 05/21/2020   LDLDIRECT 43.0 11/21/2019   TRIG 275 (H) 05/21/2020   CHOLHDL 3.9 05/21/2020   Hepatic Function Latest Ref Rng & Units 09/07/2020 05/21/2020 11/21/2019  Total Protein 6.1 - 8.1 g/dL 6.8 6.8 7.2  Albumin 3.5 - 5.2 g/dL - - 4.3  AST 10 -  35 U/L $Remo'16 16 16  'EyRLy$ ALT 6 - 29 U/L $Remo'11 11 14  'vJOLa$ Alk Phosphatase 39 - 117 U/L - - 66  Total Bilirubin 0.2 - 1.2 mg/dL 0.2 0.3 0.3  Bilirubin, Direct 0.0 - 0.3 mg/dL - - -     The 10-year ASCVD risk score Mikey Bussing DC Jr., et al., 2013) is: 60.3%   Values used to calculate the score:     Age: 72 years     Sex: Female     Is Non-Hispanic African American: No     Diabetic: Yes     Tobacco smoker: Yes     Systolic Blood Pressure: 426 mmHg     Is BP treated: Yes     HDL Cholesterol: 36 mg/dL     Total Cholesterol: 142 mg/dL   Patient has failed these meds in past: *** Patient is currently {CHL Controlled/[phone]} on the following medications:  . Rosuvastatin 20 mg daily  We discussed:  {CHL [facility] discussion:[phone]}  Plan  Continue {CHL [facility] STMHD:[id number]}  Vaccines   Reviewed and discussed patient's vaccination history.    Immunization History  Administered Date(s) Administered  . Fluad Quad(high Dose 65+) 08/24/2019, 08/21/2020  . Influenza Split 08/28/2011, 09/07/2012  . Influenza Whole 09/06/2008, 08/30/2009, 10/11/2010  . Influenza, High Dose Seasonal PF 08/24/2013, 08/18/2014, 08/07/2015, 08/08/2016, 07/31/2017, 08/10/2018  . PFIZER SARS-COV-2 Vaccination 11/29/2019, 12/19/2019   . Pneumococcal Conjugate-13 09/26/2015  . Pneumococcal Polysaccharide-23 12/10/2009   shingrix covid booster  Plan  Recommended patient receive *** vaccine in *** office.   Medication Management   Patient's preferred pharmacy is:  [facility], [place] [place] [address] Phone: [phone] Fax: [place], [place] [address] Phone: [phone] Fax: [phone]  Uses pill box? {Yes or If no, why not?:20788} Pt endorses ***% compliance  We discussed: {Pharmacy options:24294}  Plan  {[facility]:94709}    Follow up: *** month phone visit  ***

## 2020-11-06 DIAGNOSIS — N184 Chronic kidney disease, stage 4 (severe): Secondary | ICD-10-CM | POA: Diagnosis not present

## 2020-11-14 DIAGNOSIS — N184 Chronic kidney disease, stage 4 (severe): Secondary | ICD-10-CM | POA: Diagnosis not present

## 2020-11-14 DIAGNOSIS — N2581 Secondary hyperparathyroidism of renal origin: Secondary | ICD-10-CM | POA: Diagnosis not present

## 2020-11-14 DIAGNOSIS — D631 Anemia in chronic kidney disease: Secondary | ICD-10-CM | POA: Diagnosis not present

## 2020-11-14 DIAGNOSIS — I129 Hypertensive chronic kidney disease with stage 1 through stage 4 chronic kidney disease, or unspecified chronic kidney disease: Secondary | ICD-10-CM | POA: Diagnosis not present

## 2020-11-16 ENCOUNTER — Ambulatory Visit: Payer: PPO | Attending: Internal Medicine

## 2020-11-16 ENCOUNTER — Other Ambulatory Visit: Payer: Self-pay | Admitting: *Deleted

## 2020-11-16 DIAGNOSIS — I714 Abdominal aortic aneurysm, without rupture, unspecified: Secondary | ICD-10-CM

## 2020-11-16 DIAGNOSIS — Z23 Encounter for immunization: Secondary | ICD-10-CM

## 2020-11-16 NOTE — Progress Notes (Signed)
   Covid-19 Vaccination Clinic  Name:  Sheila Melton    MRN: [id number] DOB: [birth year]  11/16/2020  [name] was observed post Covid-19 immunization for 15 minutes without incident. She was provided with Vaccine Information Sheet and instruction to access the V-Safe system.   [name] was instructed to call 911 with any severe reactions post vaccine: Marland Kitchen Difficulty breathing  . Swelling of face and throat  . A fast heartbeat  . A bad rash all over body  . Dizziness and weakness   Immunizations Administered    Name Date Dose VIS Date Route   Pfizer COVID-19 Vaccine 11/16/2020  1:54 PM 0.3 mL 08/29/2020 Intramuscular   Manufacturer: [place]   Lot: [id number]   NDC: [address]-3

## 2020-11-20 NOTE — Patient Instructions (Addendum)
Blood work was ordered.     No immunization administered today.    Medications changes include :   none    Please followup in 6months    Health Maintenance, Female Adopting a healthy lifestyle and getting preventive care are important in promoting health and wellness. Ask your health care provider about:  The right schedule for you to have regular tests and exams.  Things you can do on your own to prevent diseases and keep yourself healthy. What should I know about diet, weight, and exercise? Eat a healthy diet  Eat a diet that includes plenty of vegetables, fruits, low-fat dairy products, and lean protein.  Do not eat a lot of foods that are high in solid fats, added sugars, or sodium.   Maintain a healthy weight Body mass index (BMI) is used to identify weight problems. It estimates body fat based on height and weight. Your health care provider can help determine your BMI and help you achieve or maintain a healthy weight. Get regular exercise Get regular exercise. This is one of the most important things you can do for your health. Most adults should:  Exercise for at least 150 minutes each week. The exercise should increase your heart rate and make you sweat (moderate-intensity exercise).  Do strengthening exercises at least twice a week. This is in addition to the moderate-intensity exercise.  Spend less time sitting. Even light physical activity can be beneficial. Watch cholesterol and blood lipids Have your blood tested for lipids and cholesterol at 78 years of age, then have this test every 5 years. Have your cholesterol levels checked more often if:  Your lipid or cholesterol levels are high.  You are older than 78 years of age.  You are at high risk for heart disease. What should I know about cancer screening? Depending on your health history and family history, you may need to have cancer screening at various ages. This may include screening for:  Breast  cancer.  Cervical cancer.  Colorectal cancer.  Skin cancer.  Lung cancer. What should I know about heart disease, diabetes, and high blood pressure? Blood pressure and heart disease  High blood pressure causes heart disease and increases the risk of stroke. This is more likely to develop in people who have high blood pressure readings, are of African descent, or are overweight.  Have your blood pressure checked: ? Every 3-5 years if you are 18-39 years of age. ? Every year if you are 40 years old or older. Diabetes Have regular diabetes screenings. This checks your fasting blood sugar level. Have the screening done:  Once every three years after age 40 if you are at a normal weight and have a low risk for diabetes.  More often and at a younger age if you are overweight or have a high risk for diabetes. What should I know about preventing infection? Hepatitis B If you have a higher risk for hepatitis B, you should be screened for this virus. Talk with your health care provider to find out if you are at risk for hepatitis B infection. Hepatitis C Testing is recommended for:  Everyone born from 1945 through 1965.  Anyone with known risk factors for hepatitis C. Sexually transmitted infections (STIs)  Get screened for STIs, including gonorrhea and chlamydia, if: ? You are sexually active and are younger than 78 years of age. ? You are older than 78 years of age and your health care provider tells you that you are at   at risk for this type of infection. ? Your sexual activity has changed since you were last screened, and you are at increased risk for chlamydia or gonorrhea. Ask your health care provider if you are at risk.  Ask your health care provider about whether you are at high risk for HIV. Your health care provider may recommend a prescription medicine to help prevent HIV infection. If you choose to take medicine to prevent HIV, you should first get tested for HIV. You should  then be tested every 3 months for as long as you are taking the medicine. Pregnancy  If you are about to stop having your period (premenopausal) and you may become pregnant, seek counseling before you get pregnant.  Take 400 to 800 micrograms (mcg) of folic acid every day if you become pregnant.  Ask for birth control (contraception) if you want to prevent pregnancy. Osteoporosis and menopause Osteoporosis is a disease in which the bones lose minerals and strength with aging. This can result in bone fractures. If you are 84 years old or older, or if you are at risk for osteoporosis and fractures, ask your health care provider if you should:  Be screened for bone loss.  Take a calcium or vitamin D supplement to lower your risk of fractures.  Be given hormone replacement therapy (HRT) to treat symptoms of menopause. Follow these instructions at home: Lifestyle  Do not use any products that contain nicotine or tobacco, such as cigarettes, e-cigarettes, and chewing tobacco. If you need help quitting, ask your health care provider.  Do not use street drugs.  Do not share needles.  Ask your health care provider for help if you need support or information about quitting drugs. Alcohol use  Do not drink alcohol if: ? Your health care provider tells you not to drink. ? You are pregnant, may be pregnant, or are planning to become pregnant.  If you drink alcohol: ? Limit how much you use to 0-1 drink a day. ? Limit intake if you are breastfeeding.  Be aware of how much alcohol is in your drink. In the [place] one drink equals one 12 oz bottle of beer (355 mL), one 5 oz glass of wine (148 mL), or one 1 oz glass of hard liquor (44 mL). General instructions  Schedule regular health, dental, and eye exams.  Stay current with your vaccines.  Tell your health care provider if: ? You often feel depressed. ? You have ever been abused or do not feel safe at home. Summary  Adopting a  healthy lifestyle and getting preventive care are important in promoting health and wellness.  Follow your health care provider's instructions about healthy diet, exercising, and getting tested or screened for diseases.  Follow your health care provider's instructions on monitoring your cholesterol and blood pressure. This information is not intended to replace advice given to you by your health care provider. Make sure you discuss any questions you have with your health care provider. Document Revised: 10/20/2018 Document Reviewed: 10/20/2018 Elsevier Patient Education  2021 [employer].

## 2020-11-20 NOTE — Progress Notes (Unsigned)
Subjective:    Patient ID: Sheila Melton, female    DOB: 04-27-1941, 80 y.o.   MRN: [id number]   This visit occurred during the SARS-CoV-2 public health emergency.  Safety protocols were in place, including screening questions prior to the visit, additional usage of staff PPE, and extensive cleaning of exam room while observing appropriate contact time as indicated for disinfecting solutions.    HPI She is here for a physical exam.   Sugars still high at times.  Today 130, but usually in 170's in morning.  A couple of times it has been over 200.  She is taking all of her medications as prescribed.    Her kidney function has been stable.   Medications and allergies reviewed with patient and updated if appropriate.  Patient Active Problem List   Diagnosis Date Noted  . Aortic atherosclerosis ([place]) 11/21/2020  . Vitamin D deficiency 09/07/2020  . Falls 09/07/2020  . Constipation 11/17/2018  . Skin yeast infection 02/26/2017  . 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 Osteoporosis 12/25/2016  . Depression 08/15/2016  . 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 CKD (chronic kidney disease) 05/21/2016  . Sleep difficulties 01/18/2016  . Diabetes mellitus ([place]) 10/23/2015  . Numbness of toes-Right foot 06/26/2015  . Colitis 03/19/2015  . Nephrolithiasis 03/19/2015  . AAA (abdominal aortic aneurysm) without rupture ([place]) 03/19/2015  . Anxiety state 03/19/2015  . Skin cancer 01/09/2015  . Former smoker 05/24/2014  . Bilateral renal cysts 09/16/2012  . 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 Degenerative spondylolisthesis 07/26/2012  . SHOULDER PAIN, LEFT, CHRONIC 07/25/2009  . CARDIAC MURMUR, SYSTOLIC 39/53/2023  . Mixed hyperlipidemia 12/30/2007  . Tobacco use disorder 12/30/2007  . Essential hypertension 12/30/2007  . Subclavian steal syndrome 12/30/2007  . CAROTID BRUIT 04/07/2007    Current Outpatient Medications on File Prior to  Visit  Medication Sig Dispense Refill  . amLODipine (NORVASC) 5 MG tablet TAKE 1 TABLET BY MOUTH TWICE DAILY. 180 tablet 0  . Ascorbic Acid (VITAMIN C) 250 MG CHEW Chew 1,000 mg by mouth daily.     . B Complex-C (SUPER B COMPLEX PO) Take 1 tablet by mouth daily.     . Blood Glucose Monitoring Suppl (ONE TOUCH ULTRA 2) w/Device KIT Use as advised 1 each 0  . Cholecalciferol (VITAMIN D3) 2000 UNITS TABS Take 2,000 Units by mouth daily.     . diazepam (VALIUM) 5 MG tablet TAKE 1 TABLET AT BEDTIME AS NEEDED. 90 tablet 0  . fexofenadine (ALLEGRA) 180 MG tablet Take 180 mg by mouth at bedtime.    Marland Kitchen FLUoxetine (PROZAC) 10 MG capsule TAKE 1 CAPSULE DAILY. 90 capsule 0  . furosemide (LASIX) 40 MG tablet TAKE 1/2 TO 1 TABLET DAILY AS NEEDED FOR FLUID 90 tablet 0  . glipiZIDE (GLUCOTROL) 5 MG tablet TAKE 1 TABLET IN THE MORNING AND 2 TABLETS IN THE EVENING. 270 tablet 0  . labetalol (NORMODYNE) 300 MG tablet Take 1 tablet (300 mg total) by mouth 3 (three) times daily.    . Liniments (SALONPAS PAIN RELIEF PATCH EX) Apply 1 patch topically daily as needed (back pain).    Marland Kitchen losartan (COZAAR) 100 MG tablet TAKE 1 TABLET ONCE DAILY. 90 tablet 0  . ONETOUCH ULTRA test strip CHECK BLOOD SUGAR TWICE DAILY. 100 strip 0  . oxyCODONE-acetaminophen (PERCOCET/ROXICET) 5-325 MG tablet Take 1 tablet by mouth 3 (three) times daily as needed.    Marland Kitchen PREVIDENT 5000 DRY MOUTH  1.1 % GEL dental gel Place onto teeth.    . rosuvastatin (CRESTOR) 20 MG tablet TAKE 1 TABLET ONCE DAILY. 90 tablet 0  . Triamcinolone Acetonide (NASACORT ALLERGY 24HR NA) Place 1 spray into the nose 2 (two) times daily as needed (congestion).    . vitamin B-12 (CYANOCOBALAMIN) 500 MCG tablet Take 500 mcg by mouth daily.    . Vitamin E 180 MG CAPS Take 180 Units by mouth daily.      No current facility-administered medications on file prior to visit.    Past Medical History:  Diagnosis Date  . AAA (abdominal aortic aneurysm) (HCC)    BEING WATCHED    VVS. 4.2 cm infrarenal abdominal aortic aneurysm without rupture noted 11/08/2016  . Arthritis   . Cancer ([place])    skin cancer removed from left shoulder  2017  . CAP (community acquired pneumonia) 09/02/13-11/05/13    with SIRS  . Depression    ?? from prednisone    not on pred now  . Diabetes mellitus    type ii    long time ago.  . Family history of anesthesia complication    PATIENTS MOM HAD TROUBLE WAKING UP   . GERD (gastroesophageal reflux disease)    will take occasional tums  . Heart murmur    still has.   Sees Dr. Stanford Breed  . History of kidney stones    has them at the present time  . Hyperlipidemia   . Hypertension    unspecified essential  . Renal insufficiency    "FSGS"  . Subclavian steal syndrome    LEFT SIDE   NO SIDE EFFECTS  . Subclavian steal syndrome --  left    85% blocked  . UTI (lower urinary tract infection) 08/26/13   Enterococcus and Escherichia coli both sensitive to nitrofurantoin    Past Surgical History:  Procedure Laterality Date  . ABDOMINAL HYSTERECTOMY    . BACK SURGERY    . CATARACT EXTRACTION     OS  . CYST EXCISION Right 05-23-13   Right index finger: cyst  . DILATION AND CURETTAGE OF UTERUS    . EYE SURGERY    . g1 p1    . SPINE SURGERY  Sept. 2013  . VIDEO ASSISTED THORACOSCOPY (VATS)/ LOBECTOMY Right 02/16/2017   Procedure: Right VIDEO ASSISTED THORACOSCOPY (VATS)/ Right Middle LOBECTOMY;  Surgeon: [name], MD;  Location: [facility];  Service: Thoracic;  Laterality: Right;    Social History   Socioeconomic History  . Marital status: Married    Spouse name: Not on file  . Number of children: 1  . Years of education: Not on file  . Highest education level: Not on file  Occupational History  . Not on file  Tobacco Use  . Smoking status: Light Tobacco Smoker    Packs/day: 1.00    Years: 50.00    Pack years: 50.00    Types: E-cigarettes  . Smokeless tobacco: Never Used  . Tobacco comment: e-cigs  Vaping Use  .  Vaping Use: Never used  Substance and Sexual Activity  . Alcohol use: No  . Drug use: No  . Sexual activity: Not on file  Other Topics Concern  . Not on file  Social History Narrative   Has 3 caffeinated bev a day      Exercise: none   Social Determinants of Health   Financial Resource Strain: Low Risk   . Difficulty of Paying Living Expenses: Not hard at all  Food Insecurity: No Food Insecurity  . Worried About Charity fundraiser in the Last Year: Never true  . Ran Out of Food in the Last Year: Never true  Transportation Needs: No Transportation Needs  . Lack of Transportation (Medical): No  . Lack of Transportation (Non-Medical): No  Physical Activity: Inactive  . Days of Exercise per Week: 0 days  . Minutes of Exercise per Session: 0 min  Stress: No Stress Concern Present  . Feeling of Stress : Not at all  Social Connections: Socially Integrated  . Frequency of Communication with Friends and Family: More than three times a week  . Frequency of Social Gatherings with Friends and Family: Once a week  . Attends Religious Services: More than 4 times per year  . Active Member of Clubs or Organizations: No  . Attends Archivist Meetings: More than 4 times per year  . Marital Status: Married    Family History  Problem Relation Age of Onset  . Heart failure Mother   . Heart disease Mother   . Hypertension Mother   . Other Mother        varicose veins  . Deep vein thrombosis Mother   . Varicose Veins Mother   . Heart attack Mother   . Hypertension Sister        X 2  . Diabetes Sister   . Hyperlipidemia Sister   . Other Sister        varicose veins  . Heart disease Father   . Diabetes Father   . Hyperlipidemia Father   . Hypertension Father   . Heart attack Father   . Heart disease Brother        MI in late 67s  . Hyperlipidemia Brother   . Heart attack Brother   . Coronary artery disease Other   . Diabetes Other   . Parkinsonism Brother   . Heart  failure Brother   . Hypertension Daughter     Review of Systems  Constitutional: Negative for chills and fever.  Eyes: Negative for visual disturbance.  Respiratory: Negative for cough, shortness of breath and wheezing.   Cardiovascular: Negative for chest pain, palpitations and leg swelling.  Gastrointestinal: Negative for abdominal pain, blood in stool, constipation, diarrhea and nausea.       No gerd  Genitourinary: Negative for dysuria and hematuria.  Musculoskeletal: Positive for back pain. Negative for arthralgias.  Skin: Positive for rash (lower back were stimular is - nonitchy).  Neurological: Negative for light-headedness and headaches.  Psychiatric/Behavioral: Positive for dysphoric mood. The patient is nervous/anxious.        Objective:   Vitals:   11/21/20 1423  BP: 122/70  Pulse: 83  Temp: 98.2 F (36.8 C)  SpO2: 96%   Filed Weights   11/21/20 1423  Weight: 159 lb (72.1 kg)   Body mass index is 27.72 kg/m.  BP Readings from Last 3 Encounters:  11/21/20 122/70  09/07/20 140/80  06/19/20 121/64    Wt Readings from Last 3 Encounters:  11/21/20 159 lb (72.1 kg)  09/07/20 159 lb (72.1 kg)  06/19/20 157 lb 4.8 oz (71.4 kg)     Physical Exam Constitutional: She appears well-developed and well-nourished. No distress.  HENT:  Head: Normocephalic and atraumatic.  Right Ear: External ear normal. Normal ear canal and TM Left Ear: External ear normal.  Normal ear canal and TM Mouth/Throat: Oropharynx is clear and moist.  Eyes: Conjunctivae and EOM are normal.  Neck: Neck  supple. No tracheal deviation present. No thyromegaly present.  No carotid bruit  Cardiovascular: Normal rate, regular rhythm and normal heart sounds.   No murmur heard.  No edema. Pulmonary/Chest: Effort normal and breath sounds normal. No respiratory distress. She has no wheezes. She has no rales.  Breast: deferred   Abdominal: Soft. She exhibits no distension. There is no tenderness.   Lymphadenopathy: She has no cervical adenopathy.  Skin: Skin is warm and dry. She is not diaphoretic.  Psychiatric: She has a normal mood and affect. Her behavior is normal.        Assessment & Plan:   Physical exam: Screening blood work    ordered Immunizations  tdap discussed, shingrix disussed Colonoscopy  N/a due to age 36  - has not had due to age - discussed she can still have them - if she would do something about an abn mammo at this age she should consider getting one Gyn  n/a Dexa   Due - discussed - ordered Eye exams  Up to date  Exercise  None - back limits her greatly Weight  Ok for age Substance abuse   E-cig - stressed cessation      See Problem List for Assessment and Plan of chronic medical problems.

## 2020-11-21 ENCOUNTER — Encounter: Payer: Self-pay | Admitting: Internal Medicine

## 2020-11-21 ENCOUNTER — Other Ambulatory Visit: Payer: Self-pay

## 2020-11-21 ENCOUNTER — Other Ambulatory Visit: Payer: Self-pay | Admitting: *Deleted

## 2020-11-21 ENCOUNTER — Ambulatory Visit (INDEPENDENT_AMBULATORY_CARE_PROVIDER_SITE_OTHER): Payer: PPO | Admitting: Internal Medicine

## 2020-11-21 VITALS — BP 122/70 | HR 83 | Temp 98.2°F | Ht 63.5 in | Wt 159.0 lb

## 2020-11-21 DIAGNOSIS — Z0000 Encounter for general adult medical examination without abnormal findings: Secondary | ICD-10-CM | POA: Diagnosis not present

## 2020-11-21 DIAGNOSIS — M810 Age-related osteoporosis without current pathological fracture: Secondary | ICD-10-CM | POA: Diagnosis not present

## 2020-11-21 DIAGNOSIS — E1159 Type 2 diabetes mellitus with other circulatory complications: Secondary | ICD-10-CM

## 2020-11-21 DIAGNOSIS — I10 Essential (primary) hypertension: Secondary | ICD-10-CM

## 2020-11-21 DIAGNOSIS — F3289 Other specified depressive episodes: Secondary | ICD-10-CM | POA: Diagnosis not present

## 2020-11-21 DIAGNOSIS — F411 Generalized anxiety disorder: Secondary | ICD-10-CM

## 2020-11-21 DIAGNOSIS — E782 Mixed hyperlipidemia: Secondary | ICD-10-CM

## 2020-11-21 DIAGNOSIS — I700 Atherosclerosis of aorta: Secondary | ICD-10-CM | POA: Diagnosis not present

## 2020-11-21 DIAGNOSIS — N184 Chronic kidney disease, stage 4 (severe): Secondary | ICD-10-CM | POA: Diagnosis not present

## 2020-11-21 LAB — COMPREHENSIVE METABOLIC PANEL
ALT: 12 U/L (ref 0–35)
AST: 16 U/L (ref 0–37)
Albumin: 4.2 g/dL (ref 3.5–5.2)
Alkaline Phosphatase: 100 U/L (ref 39–117)
BUN: 32 mg/dL — ABNORMAL HIGH (ref 6–23)
CO2: 27 mEq/L (ref 19–32)
Calcium: 9.7 mg/dL (ref 8.4–10.5)
Chloride: 102 mEq/L (ref 96–112)
Creatinine, Ser: 2.37 mg/dL — ABNORMAL HIGH (ref 0.40–1.20)
GFR: 19.24 mL/min — ABNORMAL LOW (ref >60.00)
Glucose, Bld: 151 mg/dL — ABNORMAL HIGH (ref 70–99)
Potassium: 3.7 mEq/L (ref 3.5–5.1)
Sodium: 138 mEq/L (ref 135–145)
Total Bilirubin: 0.3 mg/dL (ref 0.2–1.2)
Total Protein: 7.3 g/dL (ref 6.0–8.3)

## 2020-11-21 LAB — CBC WITH DIFFERENTIAL/PLATELET
Basophils Absolute: 0.1 10*3/uL (ref 0.0–0.1)
Basophils Relative: 0.8 % (ref 0.0–3.0)
Eosinophils Absolute: 0.6 10*3/uL (ref 0.0–0.7)
Eosinophils Relative: 5.1 % — ABNORMAL HIGH (ref 0.0–5.0)
HCT: 34.7 % — ABNORMAL LOW (ref 36.0–46.0)
Hemoglobin: 11.6 g/dL — ABNORMAL LOW (ref 12.0–15.0)
Lymphocytes Relative: 27.4 % (ref 12.0–46.0)
Lymphs Abs: 3.4 10*3/uL (ref 0.7–4.0)
MCHC: 33.5 g/dL (ref 30.0–36.0)
MCV: 85.6 fl (ref 78.0–100.0)
Monocytes Absolute: 1.2 10*3/uL — ABNORMAL HIGH (ref 0.1–1.0)
Monocytes Relative: 9.3 % (ref 3.0–12.0)
Neutro Abs: 7.2 10*3/uL (ref 1.4–7.7)
Neutrophils Relative %: 57.4 % (ref 43.0–77.0)
Platelets: 350 10*3/uL (ref 150.0–400.0)
RBC: 4.05 Mil/uL (ref 3.87–5.11)
RDW: 14.1 % (ref 11.5–15.5)
WBC: 12.6 10*3/uL — ABNORMAL HIGH (ref 4.0–10.5)

## 2020-11-21 LAB — TSH: TSH: 3.01 u[IU]/mL (ref 0.35–4.50)

## 2020-11-21 LAB — LIPID PANEL
Cholesterol: 183 mg/dL (ref 0–200)
HDL: 42.4 mg/dL (ref >39.00)
Total CHOL/HDL Ratio: 4
Triglycerides: 456 mg/dL — ABNORMAL HIGH (ref 0.0–149.0)

## 2020-11-21 LAB — LDL CHOLESTEROL, DIRECT: Direct LDL: 70 mg/dL

## 2020-11-21 LAB — HEMOGLOBIN A1C: Hgb A1c MFr Bld: 7.8 % — ABNORMAL HIGH (ref 4.6–6.5)

## 2020-11-21 MED ORDER — TRIAMCINOLONE ACETONIDE 0.1 % EX CREA: 1.0000 "application " | TOPICAL_CREAM | Freq: Two times a day (BID) | CUTANEOUS | 0 refills | Status: DC

## 2020-11-21 NOTE — Assessment & Plan Note (Signed)
Chronic Check lipid panel  Continue crestor 20 mg daily Regular exercise and healthy diet encouraged  

## 2020-11-21 NOTE — Assessment & Plan Note (Signed)
Chronic Continue crestor 20 mg daily Unable to exercise due to chronic back pain Encouraged healthy diet

## 2020-11-21 NOTE — Assessment & Plan Note (Signed)
Chronic Controlled, stable Continue prozac 10 mg daily and valium 5 mg nightly as needed

## 2020-11-21 NOTE — Assessment & Plan Note (Signed)
Chronic dexa due - ordered Not sure if she would want treatment Advised calcium and vitamin d Limited by back pain = not able to exercise

## 2020-11-21 NOTE — Assessment & Plan Note (Signed)
Chronic Sugars high at home Continue glipizide 5 mg in AM, 10 mg in PM Check a1c Will either add januvia 25 mg daily or increase glipizide Low sugar / carb diet Stressed increased activity

## 2020-11-21 NOTE — Assessment & Plan Note (Signed)
Chronic BP well controlled Continue amlodipine 5 mg BID, lasix 20-40 mg qd prn, labetalol 300 mg TID, losartan 100 mg qd cmp

## 2020-11-21 NOTE — Assessment & Plan Note (Addendum)
Chronic stable per nephrology cmp

## 2020-11-21 NOTE — Assessment & Plan Note (Signed)
Chronic Controlled, stable Continue prozac 10 mg daily

## 2020-11-22 ENCOUNTER — Other Ambulatory Visit: Payer: Self-pay

## 2020-11-22 DIAGNOSIS — I714 Abdominal aortic aneurysm, without rupture, unspecified: Secondary | ICD-10-CM

## 2020-11-23 ENCOUNTER — Other Ambulatory Visit: Payer: Self-pay | Admitting: Internal Medicine

## 2020-11-23 MED ORDER — SITAGLIPTIN PHOSPHATE 25 MG PO TABS: 25.0000 mg | ORAL_TABLET | Freq: Every day | ORAL | 1 refills | Status: DC

## 2020-11-29 ENCOUNTER — Inpatient Hospital Stay: Admission: RE | Admit: 2020-11-29 | Payer: PPO | Source: Ambulatory Visit

## 2020-12-06 ENCOUNTER — Ambulatory Visit (INDEPENDENT_AMBULATORY_CARE_PROVIDER_SITE_OTHER)
Admission: RE | Admit: 2020-12-06 | Discharge: 2020-12-06 | Disposition: A | Discharge status: Home / Self Care | Payer: PPO | Source: Ambulatory Visit | Attending: Internal Medicine | Admitting: Internal Medicine

## 2020-12-06 ENCOUNTER — Other Ambulatory Visit: Payer: Self-pay

## 2020-12-06 DIAGNOSIS — M810 Age-related osteoporosis without current pathological fracture: Secondary | ICD-10-CM | POA: Diagnosis not present

## 2020-12-07 ENCOUNTER — Other Ambulatory Visit: Payer: Self-pay | Admitting: Internal Medicine

## 2020-12-09 ENCOUNTER — Telehealth: Payer: Self-pay | Admitting: Internal Medicine

## 2020-12-09 ENCOUNTER — Encounter: Payer: Self-pay | Admitting: Internal Medicine

## 2020-12-09 NOTE — Telephone Encounter (Signed)
error 

## 2020-12-10 ENCOUNTER — Ambulatory Visit
Admission: RE | Admit: 2020-12-10 | Discharge: 2020-12-10 | Disposition: A | Discharge status: Home / Self Care | Payer: PPO | Source: Ambulatory Visit | Attending: Vascular Surgery | Admitting: Vascular Surgery

## 2020-12-10 ENCOUNTER — Other Ambulatory Visit: Payer: Self-pay

## 2020-12-10 DIAGNOSIS — I714 Abdominal aortic aneurysm, without rupture, unspecified: Secondary | ICD-10-CM

## 2020-12-10 DIAGNOSIS — N200 Calculus of kidney: Secondary | ICD-10-CM | POA: Diagnosis not present

## 2020-12-10 DIAGNOSIS — R109 Unspecified abdominal pain: Secondary | ICD-10-CM | POA: Diagnosis not present

## 2020-12-10 DIAGNOSIS — K5730 Diverticulosis of large intestine without perforation or abscess without bleeding: Secondary | ICD-10-CM | POA: Diagnosis not present

## 2020-12-11 ENCOUNTER — Other Ambulatory Visit: Payer: Self-pay

## 2020-12-11 DIAGNOSIS — I714 Abdominal aortic aneurysm, without rupture, unspecified: Secondary | ICD-10-CM

## 2020-12-19 ENCOUNTER — Ambulatory Visit (INDEPENDENT_AMBULATORY_CARE_PROVIDER_SITE_OTHER): Payer: PPO | Admitting: Pharmacist

## 2020-12-19 DIAGNOSIS — N1832 Chronic kidney disease, stage 3b: Secondary | ICD-10-CM

## 2020-12-19 DIAGNOSIS — E1159 Type 2 diabetes mellitus with other circulatory complications: Secondary | ICD-10-CM | POA: Diagnosis not present

## 2020-12-19 NOTE — Progress Notes (Signed)
Received call from patient to discuss herbal supplement.  Patient just bought supplement Glucofort to help with blood sugars and wants to know my opinion.  After looking into Glucofort, determined it contains bitter melon, licorice root, cinammon, alpha lipoic acid, banaba leaf, and yarrow flowers among other herbal ingredients. There is no high quality research suggesting it helps lower blood sugars consistently. It is not likely to cause harm, but patient does have renal insufficiency and a large number of medications this could potentially interact with. Pt reports it was also very expensive.  Advised patient to return Glucofort to the manufacturer and continue with Januvia to control blood sugar, since Januvia has evidence supporting its use and is covered by insurance.

## 2020-12-24 DIAGNOSIS — Z9689 Presence of other specified functional implants: Secondary | ICD-10-CM | POA: Diagnosis not present

## 2020-12-24 DIAGNOSIS — M5416 Radiculopathy, lumbar region: Secondary | ICD-10-CM | POA: Diagnosis not present

## 2020-12-24 DIAGNOSIS — M47817 Spondylosis without myelopathy or radiculopathy, lumbosacral region: Secondary | ICD-10-CM | POA: Diagnosis not present

## 2020-12-27 ENCOUNTER — Other Ambulatory Visit: Payer: Self-pay | Admitting: Internal Medicine

## 2020-12-31 ENCOUNTER — Encounter: Payer: Self-pay | Admitting: Vascular Surgery

## 2020-12-31 ENCOUNTER — Ambulatory Visit: Payer: PPO | Admitting: Vascular Surgery

## 2020-12-31 ENCOUNTER — Other Ambulatory Visit: Payer: Self-pay

## 2020-12-31 VITALS — BP 116/70 | HR 80 | Temp 97.2°F | Resp 14 | Ht 64.0 in | Wt 158.0 lb

## 2020-12-31 DIAGNOSIS — I714 Abdominal aortic aneurysm, without rupture, unspecified: Secondary | ICD-10-CM

## 2020-12-31 NOTE — Progress Notes (Signed)
[facility]  Patient name: Sheila Melton MRN: [id number] DOB: 1941-06-09 Sex: female  REASON FOR VISIT: Follow-up known infrarenal abdominal aortic aneurysm  HPI: Sheila Melton is a 80 y.o. female here for follow-up.  She is here today with her husband.  She has no new difficulties.  She continues to have severe degenerative disc disease.  Since my last visit with her she has had a spinal cord stimulator placed and feels that this may have given her some relief.  She still has significant chronic back pain.  She has no symptoms referable to her aneurysm.  Past Medical History:  Diagnosis Date  . AAA (abdominal aortic aneurysm) (HCC)    BEING WATCHED   VVS. 4.2 cm infrarenal abdominal aortic aneurysm without rupture noted 11/08/2016  . Arthritis   . Cancer ([place])    skin cancer removed from left shoulder  2017  . CAP (community acquired pneumonia) 09/02/13-11/05/13    with SIRS  . Depression    ?? from prednisone    not on pred now  . Diabetes mellitus    type ii    long time ago.  . Family history of anesthesia complication    PATIENTS MOM HAD TROUBLE WAKING UP   . GERD (gastroesophageal reflux disease)    will take occasional tums  . Heart murmur    still has.   Sees Dr. Stanford Breed  . History of kidney stones    has them at the present time  . Hyperlipidemia   . Hypertension    unspecified essential  . Renal insufficiency    "FSGS"  . Subclavian steal syndrome    LEFT SIDE   NO SIDE EFFECTS  . Subclavian steal syndrome --  left    85% blocked  . UTI (lower urinary tract infection) 08/26/13   Enterococcus and Escherichia coli both sensitive to nitrofurantoin    Family History  Problem Relation Age of Onset  . Heart failure Mother   . Heart disease Mother   . Hypertension Mother   . Other Mother        varicose veins  . Deep vein thrombosis Mother   . Varicose Veins Mother   . Heart attack Mother   .  Hypertension Sister        X 2  . Diabetes Sister   . Hyperlipidemia Sister   . Other Sister        varicose veins  . Heart disease Father   . Diabetes Father   . Hyperlipidemia Father   . Hypertension Father   . Heart attack Father   . Heart disease Brother        MI in late 37s  . Hyperlipidemia Brother   . Heart attack Brother   . Coronary artery disease Other   . Diabetes Other   . Parkinsonism Brother   . Heart failure Brother   . Hypertension Daughter     SOCIAL HISTORY: Social History   Tobacco Use  . Smoking status: Light Tobacco Smoker    Packs/day: 1.00    Years: 50.00    Pack years: 50.00    Types: E-cigarettes  . Smokeless tobacco: Never Used  . Tobacco comment: e-cigs  Substance Use Topics  . Alcohol use: No    Allergies  Allergen Reactions  . Rocephin [Ceftriaxone Sodium In Dextrose] Dermatitis and Rash    10/24 /14 blisters of palms & diffuse rash Because of a history of documented adverse  serious drug reaction;Medi Alert bracelet  is recommended  . Amoxicillin Rash    Has patient had a PCN reaction causing IMMEDIATE RASH, FACIAL/TONGUE/THROAT SWELLING, SOB, OR LIGHTHEADEDNESS WITH HYPOTENSION:  #  #  #  YES  #  #  #  Has patient had a PCN reaction causing severe rash involving mucus membranes or skin necrosis: No Has patient had a PCN reaction that required hospitalization No Has patient had a PCN reaction occurring within the last 10 years: No If all of the above answers are "NO", then may proceed with Cephalosporin use.   . Captopril Cough  . Simvastatin Other (See Comments)    MYALGIAS BUTTOCKS CRAMP  . Ciprofloxacin Other (See Comments)    Sores in mouth   . Adhesive [Tape] Rash  . Latex Rash    Patient states she gets red and a rash when latex touches her.    Current Outpatient Medications  Medication Sig Dispense Refill  . amLODipine (NORVASC) 5 MG tablet TAKE 1 TABLET BY MOUTH TWICE DAILY. 180 tablet 0  . Ascorbic Acid (VITAMIN C)  250 MG CHEW Chew 1,000 mg by mouth daily.     . B Complex-C (SUPER B COMPLEX PO) Take 1 tablet by mouth daily.     . Blood Glucose Monitoring Suppl (ONE TOUCH ULTRA 2) w/Device KIT Use as advised 1 each 0  . Cholecalciferol (VITAMIN D3) 2000 UNITS TABS Take 2,000 Units by mouth daily.     . diazepam (VALIUM) 5 MG tablet TAKE 1 TABLET AT BEDTIME AS NEEDED. 30 tablet 0  . fexofenadine (ALLEGRA) 180 MG tablet Take 180 mg by mouth at bedtime.    Marland Kitchen FLUoxetine (PROZAC) 10 MG capsule TAKE 1 CAPSULE DAILY. 90 capsule 0  . furosemide (LASIX) 40 MG tablet TAKE 1/2 TO 1 TABLET DAILY AS NEEDED FOR FLUID 90 tablet 0  . glipiZIDE (GLUCOTROL) 5 MG tablet TAKE 1 TABLET IN THE MORNING AND 2 TABLETS IN THE EVENING. 270 tablet 0  . labetalol (NORMODYNE) 300 MG tablet Take 1 tablet (300 mg total) by mouth 3 (three) times daily.    . Liniments (SALONPAS PAIN RELIEF PATCH EX) Apply 1 patch topically daily as needed (back pain).    Marland Kitchen losartan (COZAAR) 100 MG tablet TAKE 1 TABLET ONCE DAILY. 90 tablet 0  . ONETOUCH ULTRA test strip CHECK BLOOD SUGAR TWICE DAILY. 100 strip 0  . oxyCODONE-acetaminophen (PERCOCET/ROXICET) 5-325 MG tablet Take 1 tablet by mouth 3 (three) times daily as needed.    Marland Kitchen PREVIDENT 5000 DRY MOUTH 1.1 % GEL dental gel Place onto teeth.    . rosuvastatin (CRESTOR) 20 MG tablet TAKE 1 TABLET ONCE DAILY. 90 tablet 0  . sitaGLIPtin (JANUVIA) 25 MG tablet Take 1 tablet (25 mg total) by mouth daily. 90 tablet 1  . triamcinolone (KENALOG) 0.1 % Apply 1 application topically 2 (two) times daily. 30 g 0  . Triamcinolone Acetonide (NASACORT ALLERGY 24HR NA) Place 1 spray into the nose 2 (two) times daily as needed (congestion).    . vitamin B-12 (CYANOCOBALAMIN) 500 MCG tablet Take 500 mcg by mouth daily.    . Vitamin E 180 MG CAPS Take 180 Units by mouth daily.      No current facility-administered medications for this visit.    REVIEW OF SYSTEMS:  _0  denotes positive finding, _1  denotes negative  finding Cardiac  Comments:  Chest pain or chest pressure:    Shortness of breath upon exertion:    Short of  breath when lying flat:    Irregular heart rhythm:        Vascular    Pain in calf, thigh, or hip brought on by ambulation:    Pain in feet at night that wakes you up from your sleep:     Blood clot in your veins:    Leg swelling:           PHYSICAL EXAM: Vitals:   12/31/20 1310  BP: 116/70  Pulse: 80  Resp: 14  Temp: (!) 97.2 F (36.2 C)  TempSrc: Temporal  SpO2: 97%  Weight: 158 lb (71.7 kg)  Height: _0  (1.626 m)    GENERAL: The patient is a well-nourished female, in no acute distress. The vital signs are documented above. CARDIOVASCULAR: 2+ radial pulses bilaterally.  I do not palpate pedal pulses bilaterally.  She has a markedly diminished femoral pulses bilaterally. PULMONARY: There is good air exchange  MUSCULOSKELETAL: There are no major deformities or cyanosis. NEUROLOGIC: No focal weakness or paresthesias are detected. SKIN: There are no ulcers or rashes noted. PSYCHIATRIC: The patient has a normal affect.  DATA:  CT scan from 12/10/2020 was reviewed.  This was compared to a study from 6 months earlier.  Maximal diameter of her infrarenal aorta is 5.2 cm as compared to 5.1 cm earlier.  She has severe calcific disease of her aorta iliac segments at the aortic bifurcation and her common and external iliac arteries.  The arteries are very small.  By my measure the flow lumen is around 3 mm on the right external iliac.  This is a noncontrast study due to her known renal insufficiency  MEDICAL ISSUES: Had extremely long discussion with the patient and her husband.  I explained that she is certainly at the threshold of recommendation for aneurysm repair and for women at 5 cm.  Feel that she would be very difficult to be treated with stent graft due to her severe iliac disease.  She would be a candidate for aortofemoral bypass grafting with long infrarenal aortic  neck.  I explained that with her chronic renal insufficiency that this would have extremely high risk of at least transiently worsening her renal function and could potentially permanently worsen her function.  I explained that her predicted annual risk of rupture currently would be approximately 5 %/year.  I feel that her short interval risk for surgery would outweigh this.  I feel that she is truly "on the fence" regarding best treatment option.  I am comfortable seeing her again with a noncontrast study in 6 months but would also be comfortable proceeding with aortofemoral bypass excepting the increased risk for renal damage.  The patient and her husband feel comfortable with continued observation.  We will see her again in 6 months.  If she changes her mind we will discuss elective repair    Rosetta Posner, MD FACS [facility] [phone]  Note: Portions of this report may have been transcribed using voice recognition software.  Every effort has been made to ensure accuracy; however, inadvertent computerized transcription errors may still be present.

## 2021-01-20 ENCOUNTER — Other Ambulatory Visit: Payer: Self-pay | Admitting: Internal Medicine

## 2021-01-20 NOTE — Telephone Encounter (Signed)
Ok forward to pcp 

## 2021-02-18 ENCOUNTER — Other Ambulatory Visit: Payer: Self-pay | Admitting: Internal Medicine

## 2021-03-07 DIAGNOSIS — M47817 Spondylosis without myelopathy or radiculopathy, lumbosacral region: Secondary | ICD-10-CM | POA: Diagnosis not present

## 2021-03-07 DIAGNOSIS — N184 Chronic kidney disease, stage 4 (severe): Secondary | ICD-10-CM | POA: Diagnosis not present

## 2021-03-07 DIAGNOSIS — Z9689 Presence of other specified functional implants: Secondary | ICD-10-CM | POA: Diagnosis not present

## 2021-03-07 DIAGNOSIS — M5416 Radiculopathy, lumbar region: Secondary | ICD-10-CM | POA: Diagnosis not present

## 2021-03-12 DIAGNOSIS — D631 Anemia in chronic kidney disease: Secondary | ICD-10-CM | POA: Diagnosis not present

## 2021-03-12 DIAGNOSIS — N2581 Secondary hyperparathyroidism of renal origin: Secondary | ICD-10-CM | POA: Diagnosis not present

## 2021-03-12 DIAGNOSIS — I129 Hypertensive chronic kidney disease with stage 1 through stage 4 chronic kidney disease, or unspecified chronic kidney disease: Secondary | ICD-10-CM | POA: Diagnosis not present

## 2021-03-12 DIAGNOSIS — N184 Chronic kidney disease, stage 4 (severe): Secondary | ICD-10-CM | POA: Diagnosis not present

## 2021-03-21 ENCOUNTER — Other Ambulatory Visit: Payer: Self-pay | Admitting: Internal Medicine

## 2021-03-21 DIAGNOSIS — L409 Psoriasis, unspecified: Secondary | ICD-10-CM | POA: Diagnosis not present

## 2021-03-21 DIAGNOSIS — L570 Actinic keratosis: Secondary | ICD-10-CM | POA: Diagnosis not present

## 2021-03-21 DIAGNOSIS — L821 Other seborrheic keratosis: Secondary | ICD-10-CM | POA: Diagnosis not present

## 2021-03-21 DIAGNOSIS — D225 Melanocytic nevi of trunk: Secondary | ICD-10-CM | POA: Diagnosis not present

## 2021-03-21 DIAGNOSIS — L578 Other skin changes due to chronic exposure to nonionizing radiation: Secondary | ICD-10-CM | POA: Diagnosis not present

## 2021-03-21 DIAGNOSIS — Z85828 Personal history of other malignant neoplasm of skin: Secondary | ICD-10-CM | POA: Diagnosis not present

## 2021-03-21 DIAGNOSIS — L814 Other melanin hyperpigmentation: Secondary | ICD-10-CM | POA: Diagnosis not present

## 2021-04-01 DIAGNOSIS — H40013 Open angle with borderline findings, low risk, bilateral: Secondary | ICD-10-CM | POA: Diagnosis not present

## 2021-04-03 ENCOUNTER — Other Ambulatory Visit: Payer: Self-pay | Admitting: Internal Medicine

## 2021-04-09 DIAGNOSIS — N184 Chronic kidney disease, stage 4 (severe): Secondary | ICD-10-CM | POA: Diagnosis not present

## 2021-05-20 ENCOUNTER — Other Ambulatory Visit: Payer: Self-pay

## 2021-05-20 ENCOUNTER — Encounter: Payer: Self-pay | Admitting: Internal Medicine

## 2021-05-20 NOTE — Progress Notes (Signed)
Subjective:    Patient ID: Sheila Melton, female    DOB: 1941-10-27, 80 y.o.   MRN: [id number]  HPI The patient is here for follow up of their chronic medical problems, including DM, htn, hld, CKD, anxiety, depression, chronic back pain, sleep difficulties   Sugars at home -120-170  A1c 7.8%  1/22  Yesterday her left ear has been hurting and today it was hurting when she tried to clean her ear.    She continues to have severe back pain - she can not do much at all.   She can walk very little.   The nerve stimulator did not help much.   Medications and allergies reviewed with patient and updated if appropriate.  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   CKD (chronic kidney disease) 05/21/2016   Sleep difficulties 01/18/2016   Diabetes mellitus ([place]) 10/23/2015   Numbness of toes-Right foot 06/26/2015   Colitis 03/19/2015   Nephrolithiasis 03/19/2015   AAA (abdominal aortic aneurysm) without rupture ([place]) 03/19/2015   Anxiety state 03/19/2015   Skin cancer 01/09/2015   Former smoker 05/24/2014   Bilateral renal cysts 09/16/2012   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[place]) 07/26/2012   Degenerative spondylolisthesis 07/26/2012   SHOULDER PAIN, LEFT, CHRONIC 07/25/2009   CARDIAC MURMUR, SYSTOLIC 42/87/6811   Mixed hyperlipidemia 12/30/2007   Tobacco use disorder 12/30/2007   Essential hypertension 12/30/2007   Subclavian steal syndrome 12/30/2007   CAROTID BRUIT 04/07/2007    Current Outpatient Medications on File Prior to Visit  Medication Sig Dispense Refill   amLODipine (NORVASC) 5 MG tablet TAKE ONE TABLET BY MOUTH TWICE DAILY 180 tablet 0    Ascorbic Acid (VITAMIN C) 250 MG CHEW Chew 1,000 mg by mouth daily.      ascorbic Acid (VITAMIN C) 500 MG CPCR 1 cap PO QD     B Complex-C (SUPER B COMPLEX PO) Take 1 tablet by mouth daily.      Blood Glucose Monitoring Suppl (ONE TOUCH ULTRA 2) w/Device KIT Use as advised 1 each 0   Cholecalciferol (VITAMIN D3) 2000 UNITS TABS Take 2,000 Units by mouth daily.      Cholecalciferol 125 MCG (5000 UT) TABS 1 cap PO QD     diazepam (VALIUM) 5 MG tablet TAKE ONE TABLET BY MOUTH AT BEDTIME AS NEEDED. 30 tablet 2   fexofenadine (ALLEGRA) 180 MG tablet Take 180 mg by mouth at bedtime.     FLUoxetine (PROZAC) 10 MG capsule TAKE ONE CAPSULE BY MOUTH DAILY 90 capsule 0   furosemide (LASIX) 40 MG tablet TAKE ONE-HALF TO ONE TABLET BY MOUTH DAILY AS NEEDED FOR IN FLUID. 90 tablet 0   glipiZIDE (GLUCOTROL) 5 MG tablet TAKE ONE TABLET BY MOUTH IN THE MORNING AND TAKE TWO TABLETS IN THE EVENING 270 tablet 0   KERENDIA 10 MG TABS Take 1 tablet by mouth daily.     labetalol (NORMODYNE) 300 MG tablet Take 1 tablet (300 mg total) by mouth 3 (three) times daily.     Liniments (SALONPAS PAIN RELIEF PATCH EX) Apply 1 patch topically daily as needed (back pain).     losartan (COZAAR) 100 MG tablet TAKE ONE TABLET BY MOUTH  DAILY 90 tablet 0   ONETOUCH ULTRA test strip CHECK BLOOD SUGAR TWICE DAILY. 100 strip 0   oxyCODONE-acetaminophen (PERCOCET/ROXICET) 5-325 MG tablet Take 1 tablet by mouth 3 (three) times daily as needed.     PREVIDENT 5000 DRY MOUTH 1.1 % GEL dental gel Place onto teeth.     rosuvastatin (CRESTOR) 20 MG tablet TAKE ONE TABLET BY MOUTH DAILY 90 tablet 0   triamcinolone (KENALOG) 0.1 % Apply 1 application topically 2 (two) times daily. 30 g 0   Triamcinolone Acetonide (NASACORT ALLERGY 24HR NA) Place 1 spray into the nose 2 (two) times daily as needed (congestion).     vitamin B-12 (CYANOCOBALAMIN) 500 MCG tablet Take 500 mcg by mouth daily.     Vitamin E 180 MG CAPS Take 180 Units by mouth daily.       No current facility-administered medications on file prior to visit.    Past Medical History:  Diagnosis Date   AAA (abdominal aortic aneurysm) ([place])    BEING WATCHED   VVS. 4.2 cm infrarenal abdominal aortic aneurysm without rupture noted 11/08/2016   Arthritis    Cancer ([place])    skin cancer removed from left shoulder  2017   CAP (community acquired pneumonia) 09/02/13-11/05/13    with SIRS   Depression    ?? from prednisone    not on pred now   Diabetes mellitus    type ii    long time ago.   Family history of anesthesia complication    PATIENTS MOM HAD TROUBLE WAKING UP    GERD (gastroesophageal reflux disease)    will take occasional tums   Heart murmur    still has.   Sees Dr. Stanford Breed   History of kidney stones    has them at the present time   Hyperlipidemia    Hypertension    unspecified essential   Renal insufficiency    "FSGS"   Subclavian steal syndrome    LEFT SIDE   NO SIDE EFFECTS   Subclavian steal syndrome --  left    85% blocked   UTI (lower urinary tract infection) 08/26/13   Enterococcus and Escherichia coli both sensitive to nitrofurantoin    Past Surgical History:  Procedure Laterality Date   ABDOMINAL HYSTERECTOMY     BACK SURGERY     CATARACT EXTRACTION     OS   CYST EXCISION Right 05-23-13   Right index finger: cyst   DILATION AND CURETTAGE OF UTERUS     EYE SURGERY     g1 p1     SPINE SURGERY  Sept. 2013   VIDEO ASSISTED THORACOSCOPY (VATS)/ LOBECTOMY Right 02/16/2017   Procedure: Right VIDEO ASSISTED THORACOSCOPY (VATS)/ Right Middle LOBECTOMY;  Surgeon: [name], MD;  Location: [facility];  Service: Thoracic;  Laterality: Right;    Social History   Socioeconomic History   Marital status: Married    Spouse name: Not on file   Number of children: 1   Years of education: Not on file   Highest education level: Not on file  Occupational History   Not on file  Tobacco Use   Smoking status: Light Smoker    Packs/day: 1.00     Years: 50.00    Pack years: 50.00    Types: E-cigarettes, Cigarettes   Smokeless tobacco: Never   Tobacco comments:    e-cigs  Vaping Use   Vaping Use: Never used  Substance and Sexual Activity   Alcohol use: No  Drug use: No   Sexual activity: Not on file  Other Topics Concern   Not on file  Social History Narrative   Has 3 caffeinated bev a day      Exercise: none   Social Determinants of Health   Financial Resource Strain: Low Risk    Difficulty of Paying Living Expenses: Not hard at all  Food Insecurity: No Food Insecurity   Worried About Charity fundraiser in the Last Year: Never true   Ran Out of Food in the Last Year: Never true  Transportation Needs: No Transportation Needs   Lack of Transportation (Medical): No   Lack of Transportation (Non-Medical): No  Physical Activity: Inactive   Days of Exercise per Week: 0 days   Minutes of Exercise per Session: 0 min  Stress: No Stress Concern Present   Feeling of Stress : Not at all  Social Connections: Socially Integrated   Frequency of Communication with Friends and Family: More than three times a week   Frequency of Social Gatherings with Friends and Family: Once a week   Attends Religious Services: More than 4 times per year   Active Member of [employer] or Organizations: No   Attends Music therapist: More than 4 times per year   Marital Status: Married    Family History  Problem Relation Age of Onset   Heart failure Mother    Heart disease Mother    Hypertension Mother    Other Mother        varicose veins   Deep vein thrombosis Mother    Varicose Veins Mother    Heart attack Mother    Hypertension Sister        X 2   Diabetes Sister    Hyperlipidemia Sister    Other Sister        varicose veins   Heart disease Father    Diabetes Father    Hyperlipidemia Father    Hypertension Father    Heart attack Father    Heart disease Brother        MI in late 51s   Hyperlipidemia Brother     Heart attack Brother    Coronary artery disease Other    Diabetes Other    Parkinsonism Brother    Heart failure Brother    Hypertension Daughter     Review of Systems  Constitutional:  Negative for fever.       Hot flashes at night x months - only at night  HENT:  Positive for ear pain (left ear radiating down neck). Negative for congestion, sinus pain and sore throat.   Respiratory:  Negative for cough, shortness of breath and wheezing.   Cardiovascular:  Negative for chest pain, palpitations and leg swelling.  Neurological:  Negative for dizziness and headaches.      Objective:   Vitals:   05/21/21 1338  BP: 110/68  Pulse: 78  Temp: 98.4 F (36.9 C)  SpO2: 97%   BP Readings from Last 3 Encounters:  05/21/21 110/68  12/31/20 116/70  11/21/20 122/70   Wt Readings from Last 3 Encounters:  05/21/21 158 lb (71.7 kg)  12/31/20 158 lb (71.7 kg)  11/21/20 159 lb (72.1 kg)   Body mass index is 27.12 kg/m.   Physical Exam    Constitutional: Appears well-developed and well-nourished. No distress.  HENT:  Head: Normocephalic and atraumatic. Ears: Left ear canal with minimal cerumen, no erythema, left TM normal Neck: Neck supple. No tracheal deviation  present. No thyromegaly present.  No cervical lymphadenopathy Cardiovascular: Normal rate, regular rhythm and normal heart sounds.   No murmur heard. No carotid bruit .  No edema Pulmonary/Chest: Effort normal and breath sounds normal. No respiratory distress. No has no wheezes. No rales.  Skin: Skin is warm and dry. Not diaphoretic.  Psychiatric: Normal mood and affect. Behavior is normal.    Diabetic Foot Exam - Simple   Simple Foot Form Diabetic Foot exam was performed with the following findings: Yes 05/21/2021  2:11 PM  Visual Inspection No deformities, no ulcerations, no other skin breakdown bilaterally: Yes Sensation Testing Intact to touch and monofilament testing bilaterally: Yes Pulse Check Posterior Tibialis  and Dorsalis pulse intact bilaterally: Yes Comments      Assessment & Plan:    See Problem List for Assessment and Plan of chronic medical problems.    This visit occurred during the SARS-CoV-2 public health emergency.  Safety protocols were in place, including screening questions prior to the visit, additional usage of staff PPE, and extensive cleaning of exam room while observing appropriate contact time as indicated for disinfecting solutions.

## 2021-05-21 ENCOUNTER — Ambulatory Visit (INDEPENDENT_AMBULATORY_CARE_PROVIDER_SITE_OTHER): Payer: PPO | Admitting: Internal Medicine

## 2021-05-21 VITALS — BP 110/68 | HR 78 | Temp 98.4°F | Ht 64.0 in | Wt 158.0 lb

## 2021-05-21 DIAGNOSIS — I10 Essential (primary) hypertension: Secondary | ICD-10-CM | POA: Diagnosis not present

## 2021-05-21 DIAGNOSIS — E1159 Type 2 diabetes mellitus with other circulatory complications: Secondary | ICD-10-CM | POA: Diagnosis not present

## 2021-05-21 DIAGNOSIS — N184 Chronic kidney disease, stage 4 (severe): Secondary | ICD-10-CM | POA: Diagnosis not present

## 2021-05-21 DIAGNOSIS — F3289 Other specified depressive episodes: Secondary | ICD-10-CM | POA: Diagnosis not present

## 2021-05-21 DIAGNOSIS — F411 Generalized anxiety disorder: Secondary | ICD-10-CM | POA: Diagnosis not present

## 2021-05-21 DIAGNOSIS — E782 Mixed hyperlipidemia: Secondary | ICD-10-CM

## 2021-05-21 DIAGNOSIS — M810 Age-related osteoporosis without current pathological fracture: Secondary | ICD-10-CM

## 2021-05-21 DIAGNOSIS — G479 Sleep disorder, unspecified: Secondary | ICD-10-CM | POA: Diagnosis not present

## 2021-05-21 LAB — COMPREHENSIVE METABOLIC PANEL WITH GFR
ALT: 12 U/L (ref 0–35)
AST: 17 U/L (ref 0–37)
Albumin: 4.1 g/dL (ref 3.5–5.2)
Alkaline Phosphatase: 71 U/L (ref 39–117)
BUN: 41 mg/dL — ABNORMAL HIGH (ref 6–23)
CO2: 23 meq/L (ref 19–32)
Calcium: 9.5 mg/dL (ref 8.4–10.5)
Chloride: 102 meq/L (ref 96–112)
Creatinine, Ser: 2.52 mg/dL — ABNORMAL HIGH (ref 0.40–1.20)
GFR: 17.81 mL/min — ABNORMAL LOW
Glucose, Bld: 178 mg/dL — ABNORMAL HIGH (ref 70–99)
Potassium: 4.1 meq/L (ref 3.5–5.1)
Sodium: 136 meq/L (ref 135–145)
Total Bilirubin: 0.3 mg/dL (ref 0.2–1.2)
Total Protein: 7 g/dL (ref 6.0–8.3)

## 2021-05-21 LAB — LIPID PANEL
Cholesterol: 190 mg/dL (ref 0–200)
HDL: 36.4 mg/dL — ABNORMAL LOW
Total CHOL/HDL Ratio: 5
Triglycerides: 463 mg/dL — ABNORMAL HIGH (ref 0.0–149.0)

## 2021-05-21 LAB — LDL CHOLESTEROL, DIRECT: Direct LDL: 62 mg/dL

## 2021-05-21 LAB — HEMOGLOBIN A1C: Hgb A1c MFr Bld: 7.5 % — ABNORMAL HIGH (ref 4.6–6.5)

## 2021-05-21 MED ORDER — SITAGLIPTIN PHOSPHATE 25 MG PO TABS: 25.0000 mg | ORAL_TABLET | Freq: Every day | ORAL | 1 refills | Status: DC

## 2021-05-21 NOTE — Assessment & Plan Note (Signed)
Chronic Controlled, stable Continue fluoxetine 10 mg daily  

## 2021-05-21 NOTE — Assessment & Plan Note (Signed)
Chronic BP well controlled Continue norvasc 5 mg qd, labetalol 300 mg tid, losartan 100 mg qd cmp

## 2021-05-21 NOTE — Assessment & Plan Note (Signed)
Chronic Following with Dr. Posey Pronto Currently on a new medication-Kerendia

## 2021-05-21 NOTE — Assessment & Plan Note (Signed)
Chronic Controlled Continue Valium 5 mg nightly as needed Also taking oxycodone at night, which does help her sleep

## 2021-05-21 NOTE — Assessment & Plan Note (Signed)
Chronic DEXA up-to-date-reviewed at her last visit She is interested in Prolia-okayed by her kidney doctor She will review possible side effects and let me know if she wants to initiate this and we will look into coverage by her insurance Unfortunately she is not able to exercise on a regular basis due to severe back pain Continue vitamin D daily

## 2021-05-21 NOTE — Assessment & Plan Note (Signed)
Chronic Check lipid panel Continue Crestor 20 mg daily

## 2021-05-21 NOTE — Patient Instructions (Addendum)
Blood work was ordered.      We can consider rybelsus for your diabetes if your kidney doctor approves.    Medications changes include :   stop [place]  Your prescription(s) have been submitted to your pharmacy. Please take as directed and contact our office if you believe you are having problem(s) with the medication(s).     Please followup in 3 months   Semaglutide Oral Tablets What is this medication? SEMAGLUTIDE (Sem a GLOO tide) controls blood sugar in people with type 2diabetes. It is used with lifestyle changes like diet and exercise. This medicine may be used for other purposes; ask your health care provider orpharmacist if you have questions. COMMON BRAND NAME(S): Rybelsus What should I tell my care team before I take this medication? They need to know if you have any of these conditions: endocrine tumors (MEN 2) or if someone in your family had these tumors eye disease history of pancreatitis kidney disease stomach or intestine problems thyroid cancer or if someone in your family had thyroid cancer vision problems an unusual or allergic reaction to semaglutide, other medicines, foods, dyes, or preservatives pregnant or trying to get pregnant breast-feeding How should I use this medication? Take this medicine by mouth. Take it as directed on the prescription label at the same time every day. Take the dose right after waking up. Do not eat or drink anything before taking it. Do not take it with any other drink except a glass of plain water that is less than 4 ounces (less than 120 mL). Do not cut, crush or chew this medicine. Swallow the tablets whole. After taking it, do not eat breakfast, drink, or take any other medicines or vitamins for at least 47minutes. Keep taking it unless your health care provider tells you to stop. A special MedGuide will be given to you by the pharmacist with eachprescription and refill. Be sure to read this information carefully each  time. Talk to your health care provider about the use of this medicine in children.Special care may be needed. Overdosage: If you think you have taken too much of this medicine contact apoison control center or emergency room at once. NOTE: This medicine is only for you. Do not share this medicine with others. What if I miss a dose? If you miss a dose, skip it. Take your next dose at the normal time. Do nottake extra or 2 doses at the same time to make up for the missed dose. What may interact with this medication? What may interact with this medicine? aminophylline carbamazepine cyclosporine digoxin levothyroxine other medicines for diabetes phenytoin tacrolimus theophylline warfarin Many medications may cause changes in blood sugar, these include: alcohol containing beverages antiviral medicines for HIV or AIDS aspirin and aspirin-like drugs certain medicines for blood pressure, heart disease, irregular heart beat chromium diuretics female hormones, such as estrogens or progestins, birth control pills fenofibrate gemfibrozil isoniazid lanreotide female hormones or anabolic steroids MAOIs like Carbex, Eldepryl, Marplan, Nardil, and Parnate medicines for weight loss medicines for allergies, asthma, cold, or cough medicines for depression, anxiety, or psychotic disturbances niacin nicotine NSAIDs, medicines for pain and inflammation, like ibuprofen or naproxen octreotide pasireotide pentamidine phenytoin probenecid quinolone antibiotics such as ciprofloxacin, levofloxacin, ofloxacin some herbal dietary supplements steroid medicines such as prednisone or cortisone sulfamethoxazole; trimethoprim thyroid hormones Some medications can hide the warning symptoms of low blood sugar (hypoglycemia). You may need to monitor your blood sugar more closely if youare taking one of these medications. These  include: beta-blockers, often used for high blood pressure or heart problems  (examples include atenolol, metoprolol, propranolol) clonidine guanethidine reserpine This list may not describe all possible interactions. Give your health care provider a list of all the medicines, herbs, non-prescription drugs, or dietary supplements you use. Also tell them if you smoke, drink alcohol, or use illegaldrugs. Some items may interact with your medicine. What should I watch for while using this medication? Visit your health care provider for regular checks on your progress. Check with your health care provider if you have severe diarrhea, nausea, and vomiting, or if you sweat a lot. The loss of too much body fluid may make itdangerous for you to take this medicine. A test called the HbA1C (A1C) will be monitored. This is a simple blood test. It measures your blood sugar control over the last 2 to 3 months. You willreceive this test every 3 to 6 months. Learn how to check your blood sugar. Learn the symptoms of low and high bloodsugar and how to manage them. Always carry a quick-source of sugar with you in case you have symptoms of low blood sugar. Examples include hard sugar candy or glucose tablets. Make sure others know that you can choke if you eat or drink when you develop serious symptoms of low blood sugar, such as seizures or unconsciousness. Get medicalhelp at once. Tell your health care provider if you have high blood sugar. You might need to change the dose of your medicine. If you are sick or exercising more thanusual, you might need to change the dose of your medicine. Do not skip meals. Ask your health care provider if you should avoid alcohol. Many nonprescription cough and cold products contain sugar or alcohol. Thesecan affect blood sugar. Wear a medical ID bracelet or chain. Carry a card that describes yourcondition. List the medicines and doses you take on the card. Do not become pregnant while taking this medicine. Women should inform their health care provider if they  wish to become pregnant or think they might be pregnant. There is a potential for serious side effects to an unborn child. Talk to your health care provider for more information. Do not breast-feed aninfant while taking this medicine. What side effects may I notice from receiving this medication? Side effects that you should report to your doctor or health care provider assoon as possible: allergic reactions (skin rash, itching or hives; swelling of the face, lips, or tongue) changes in vision diarrhea that continues or is severe infection (fever, chills, cough, sore throat, pain or trouble passing urine) kidney injury (trouble passing urine or change in the amount of urine) low blood sugar (feeling anxious; confusion; dizziness; increased hunger; unusually weak or tired; increased sweating; shakiness; cold, clammy skin; irritable; headache; blurred vision; fast heartbeat; loss of consciousness) lump or swelling on the neck painful or difficulty swallowing severe nausea severe or unusual stomach pain trouble breathing vomiting Side effects that usually do not require medical attention (report these toyour doctor or health care provider if they continue or are bothersome): constipation diarrhea nausea upset stomach This list may not describe all possible side effects. Call your doctor for medical advice about side effects. You may report side effects to FDA at1-800-FDA-1088. Where should I keep my medication? Keep out of the reach of children and pets. Store at room temperature between 20 and 25 degrees C (68 and 77 degrees F). Keep this medicine in the original container. Protect from moisture. Keep the container tightly closed.  Get rid of any unused medicine after the expirationdate. To get rid of medicines that are no longer needed or have expired: Take the medicine to a medicine take-back program. Check with your pharmacy or law enforcement to find a location. If you cannot return the  medicine, check the label or package insert to see if the medicine should be thrown out in the garbage or flushed down the toilet. If you are not sure, ask your health care provider. If it is safe to put it in the trash, take the medicine out of the container. Mix the medicine with cat litter, dirt, coffee grounds, or other unwanted substance. Seal the mixture in a bag or container. Put it in the trash. NOTE: This sheet is a summary. It may not cover all possible information. If you have questions about this medicine, talk to your doctor, pharmacist, orhealth care provider.  2022 Elsevier/Gold Standard (2020-09-24 15:08:33)

## 2021-05-21 NOTE — Assessment & Plan Note (Signed)
Chronic Controlled, stable Continue Prozac 10 mg daily and Valium 5 mg nightly as needed

## 2021-05-21 NOTE — Assessment & Plan Note (Addendum)
Chronic She is concerned about [place] and possible cause of pancreatic cancer Sugar at home 120-170 Check a1c Continue Januvia 25 mg daily-we will look into stopping this-she is concerned about possible side effects of pancreatic cancer Continue glipizide 5 mg in am, 10 mg in pm Can consider rybelsus or insulin-she will discuss Rybelsus with her kidney doctor to get his opinion before we consider starting

## 2021-05-24 ENCOUNTER — Telehealth: Payer: Self-pay | Admitting: Internal Medicine

## 2021-05-24 ENCOUNTER — Other Ambulatory Visit: Payer: Self-pay | Admitting: Internal Medicine

## 2021-05-24 DIAGNOSIS — E1159 Type 2 diabetes mellitus with other circulatory complications: Secondary | ICD-10-CM

## 2021-05-24 MED ORDER — RYBELSUS 3 MG PO TABS: 3.0000 mg | ORAL_TABLET | Freq: Every day | ORAL | 0 refills | Status: DC

## 2021-05-24 NOTE — Telephone Encounter (Signed)
Patient informed. 

## 2021-05-24 NOTE — Telephone Encounter (Signed)
   Patient calling to confirm she would like to be prescribed Rybelsus  Please send to [facility] [address]

## 2021-05-27 ENCOUNTER — Other Ambulatory Visit: Payer: Self-pay

## 2021-05-27 DIAGNOSIS — I714 Abdominal aortic aneurysm, without rupture, unspecified: Secondary | ICD-10-CM

## 2021-05-29 DIAGNOSIS — M961 Postlaminectomy syndrome, not elsewhere classified: Secondary | ICD-10-CM | POA: Diagnosis not present

## 2021-05-29 DIAGNOSIS — G894 Chronic pain syndrome: Secondary | ICD-10-CM | POA: Diagnosis not present

## 2021-05-29 DIAGNOSIS — Z9689 Presence of other specified functional implants: Secondary | ICD-10-CM | POA: Diagnosis not present

## 2021-06-04 ENCOUNTER — Ambulatory Visit: Payer: PPO | Admitting: Vascular Surgery

## 2021-06-11 ENCOUNTER — Other Ambulatory Visit: Payer: Self-pay | Admitting: Internal Medicine

## 2021-06-17 ENCOUNTER — Telehealth: Payer: Self-pay

## 2021-06-17 ENCOUNTER — Other Ambulatory Visit: Payer: Self-pay | Admitting: Internal Medicine

## 2021-06-18 MED ORDER — RYBELSUS 7 MG PO TABS: 7.0000 mg | ORAL_TABLET | Freq: Every day | ORAL | 5 refills | Status: DC

## 2021-06-18 NOTE — Telephone Encounter (Signed)
Increase rybelsus to 7 mg  - new script sent to [facility]

## 2021-06-18 NOTE — Telephone Encounter (Signed)
Message sent in today.

## 2021-06-23 ENCOUNTER — Encounter: Payer: Self-pay | Admitting: Internal Medicine

## 2021-06-24 ENCOUNTER — Other Ambulatory Visit: Payer: Self-pay | Admitting: Internal Medicine

## 2021-06-25 ENCOUNTER — Other Ambulatory Visit: Payer: Self-pay | Admitting: Internal Medicine

## 2021-06-25 ENCOUNTER — Ambulatory Visit (INDEPENDENT_AMBULATORY_CARE_PROVIDER_SITE_OTHER): Payer: PPO | Admitting: Pharmacist

## 2021-06-25 ENCOUNTER — Other Ambulatory Visit: Payer: Self-pay

## 2021-06-25 DIAGNOSIS — N184 Chronic kidney disease, stage 4 (severe): Secondary | ICD-10-CM

## 2021-06-25 DIAGNOSIS — E1159 Type 2 diabetes mellitus with other circulatory complications: Secondary | ICD-10-CM

## 2021-06-25 DIAGNOSIS — E782 Mixed hyperlipidemia: Secondary | ICD-10-CM

## 2021-06-25 DIAGNOSIS — I10 Essential (primary) hypertension: Secondary | ICD-10-CM

## 2021-06-25 DIAGNOSIS — I700 Atherosclerosis of aorta: Secondary | ICD-10-CM

## 2021-06-25 MED ORDER — LANTUS SOLOSTAR 100 UNIT/ML ~~LOC~~ SOPN: 10.0000 [IU] | PEN_INJECTOR | Freq: Every day | SUBCUTANEOUS | 5 refills | Status: DC

## 2021-06-25 MED ORDER — PEN NEEDLES 32G X 5 MM MISC: 3 refills | Status: DC

## 2021-06-25 MED ORDER — RYBELSUS 7 MG PO TABS: 7.0000 mg | ORAL_TABLET | Freq: Every day | ORAL | 3 refills | Status: DC

## 2021-06-25 NOTE — Addendum Note (Signed)
Addended by: Binnie Rail on: 06/25/2021 07:19 AM   Modules accepted: Orders

## 2021-06-25 NOTE — Progress Notes (Signed)
Chronic Care Management Pharmacy Note  06/25/2021 Name:  Sheila Melton MRN:  [id number] DOB:  May 23, 1941  Summary: -Home BG elevated on Rybelsus 3 mg only (range 163-254). Pt cannot afford donut hole copay but will qualify for pt assistance -Pt is at risk for low BG if Rybelsus in increased and insulin is started at the same time  Recommendations/Changes made from today's visit: -Increase Rybelsus to 6 mg (3 mg samples provided - take 2 tablets daily) -Hold off on starting insulin for now. F/U call in 1 week to check sugars, may start insulin then if warranted -Pursue pt assistance for Rybelsus - pt will bring income documents to office and sign paperwork   Subjective: Sheila Melton is an 80 y.o. year old female who is a primary patient of Burns, Claudina Lick, MD.  The CCM team was consulted for assistance with disease management and care coordination needs.    Engaged with patient by telephone for follow up visit in response to provider referral for pharmacy case management and/or care coordination services.   Consent to Services:  The patient was given information about Chronic Care Management services, agreed to services, and gave verbal consent prior to initiation of services.  Please see initial visit note for detailed documentation.   Patient Care Team: Binnie Rail, MD as PCP - General (Internal Medicine) Wellington Hampshire, MD as PCP - Cardiology (Cardiology) Elmarie Shiley, MD as Consulting Physician (Nephrology) Charlton Haws, [facility] as Pharmacist (Pharmacist)  Recent office visits: 05/21/21 Dr Quay Burow OV: chronic f/u; A1c 7.5, GFR 17. Stop glipizide, Januvia. Start Rybelsus 3 mg.   Recent consult visits: 03/12/21 Dr Posey Pronto (nephrology): started Kerendia 10 mg daily  Hospital visits: None in previous 6 months  Objective:  Lab Results  Component Value Date   CREATININE 2.52 (H) 05/21/2021   BUN 41 (H) 05/21/2021   GFR 17.81 (L) 05/21/2021   GFRNONAA 46 (L) 02/19/2017    GFRAA 53 (L) 02/19/2017   NA 136 05/21/2021   K 4.1 05/21/2021   CALCIUM 9.5 05/21/2021   CO2 23 05/21/2021   GLUCOSE 178 (H) 05/21/2021    Lab Results  Component Value Date/Time   HGBA1C 7.5 (H) 05/21/2021 02:24 PM   HGBA1C 7.8 (H) 11/21/2020 03:13 PM   FRUCTOSAMINE 332 (H) 04/25/2015 12:52 PM   GFR 17.81 (L) 05/21/2021 02:24 PM   GFR 19.24 (L) 11/21/2020 03:13 PM   MICROALBUR 194.0 Verified by manual dilution. (H) 11/16/2014 12:05 PM   MICROALBUR 95.5 (H) 05/09/2014 11:28 AM    Last diabetic Eye exam:  Lab Results  Component Value Date/Time   HMDIABEYEEXA No Retinopathy 10/29/2020 12:00 AM    Last diabetic Foot exam: No results found for: HMDIABFOOTEX   Lab Results  Component Value Date   CHOL 190 05/21/2021   HDL 36.40 (L) 05/21/2021   LDLCALC 70 05/21/2020   LDLDIRECT 62.0 05/21/2021   TRIG (H) 05/21/2021    463.0 Triglyceride is over 400; calculations on Lipids are invalid.   CHOLHDL 5 05/21/2021    Hepatic Function Latest Ref Rng & Units 05/21/2021 11/21/2020 09/07/2020  Total Protein 6.0 - 8.3 g/dL 7.0 7.3 6.8  Albumin 3.5 - 5.2 g/dL 4.1 4.2 -  AST 0 - 37 U/L $Remo'17 16 16  'aSrnO$ ALT 0 - 35 U/L $Remo'12 12 11  'zOngM$ Alk Phosphatase 39 - 117 U/L 71 100 -  Total Bilirubin 0.2 - 1.2 mg/dL 0.3 0.3 0.2  Bilirubin, Direct 0.0 - 0.3 mg/dL - - -  Lab Results  Component Value Date/Time   TSH 3.01 11/21/2020 03:13 PM   TSH 2.95 11/17/2018 03:03 PM   FREET4 0.78 11/27/2011 01:53 PM   FREET4 0.8 12/31/2007 08:13 AM    CBC Latest Ref Rng & Units 11/21/2020 05/21/2020 11/17/2018  WBC 4.0 - 10.5 K/uL 12.6(H) 11.2(H) 11.3(H)  Hemoglobin 12.0 - 15.0 g/dL 11.6(L) 10.8(L) 11.7(L)  Hematocrit 36.0 - 46.0 % 34.7(L) 32.6(L) 35.4(L)  Platelets 150.0 - 400.0 K/uL 350.0 318 349.0    Lab Results  Component Value Date/Time   VD25OH 32 09/07/2020 04:19 PM   VD25OH 87 10/11/2010 08:57 PM    Clinical ASCVD: Yes  The 10-year ASCVD risk score Mikey Bussing DC Jr., et al., 2013) is: 46%   Values used to  calculate the score:     Age: 37 years     Sex: Female     Is Non-Hispanic African American: No     Diabetic: Yes     Tobacco smoker: Yes     Systolic Blood Pressure: 476 mmHg     Is BP treated: Yes     HDL Cholesterol: 36.4 mg/dL     Total Cholesterol: 190 mg/dL    Depression screen [facility] 2/9 09/14/2020 09/07/2020 11/17/2018  Decreased Interest 0 0 0  Down, Depressed, Hopeless 0 0 0  PHQ - 2 Score 0 0 0  Altered sleeping - - -  Tired, decreased energy - - -  Change in appetite - - -  Feeling bad or failure about yourself  - - -  Trouble concentrating - - -  Moving slowly or fidgety/restless - - -  Suicidal thoughts - - -  PHQ-9 Score - - -  Some recent data might be hidden     Social History   Tobacco Use  Smoking Status Light Smoker   Packs/day: 1.00   Years: 50.00   Pack years: 50.00   Types: E-cigarettes, Cigarettes  Smokeless Tobacco Never  Tobacco Comments   e-cigs   BP Readings from Last 3 Encounters:  05/21/21 110/68  12/31/20 116/70  11/21/20 122/70   Pulse Readings from Last 3 Encounters:  05/21/21 78  12/31/20 80  11/21/20 83   Wt Readings from Last 3 Encounters:  05/21/21 158 lb (71.7 kg)  12/31/20 158 lb (71.7 kg)  11/21/20 159 lb (72.1 kg)   BMI Readings from Last 3 Encounters:  05/21/21 27.12 kg/m  12/31/20 27.12 kg/m  11/21/20 27.72 kg/m    Assessment/Interventions: Review of patient past medical history, allergies, medications, health status, including review of consultants reports, laboratory and other test data, was performed as part of comprehensive evaluation and provision of chronic care management services.   SDOH:  (Social Determinants of Health) assessments and interventions performed: Yes  SDOH Screenings   Alcohol Screen: Low Risk    Last Alcohol Screening Score (AUDIT): 0  Depression (PHQ2-9): Low Risk    PHQ-2 Score: 0  Financial Resource Strain: Low Risk    Difficulty of Paying Living Expenses: Not hard at all  Food  Insecurity: No Food Insecurity   Worried About Charity fundraiser in the Last Year: Never true   Ran Out of Food in the Last Year: Never true  Housing: Low Risk    Last Housing Risk Score: 0  Physical Activity: Inactive   Days of Exercise per Week: 0 days   Minutes of Exercise per Session: 0 min  Social Connections: Socially Integrated   Frequency of Communication with Friends and Family: More  than three times a week   Frequency of Social Gatherings with Friends and Family: Once a week   Attends Religious Services: More than 4 times per year   Active Member of [employer] or Organizations: No   Attends Music therapist: More than 4 times per year   Marital Status: Married  Stress: No Stress Concern Present   Feeling of Stress : Not at all  Tobacco Use: High Risk   Smoking Tobacco Use: Light Smoker   Smokeless Tobacco Use: Never  Transportation Needs: No Transportation Needs   Lack of Transportation (Medical): No   Lack of Transportation (Non-Medical): No    CCM Care Plan  Allergies  Allergen Reactions   Rocephin [Ceftriaxone Sodium In Dextrose] Dermatitis and Rash    10/24 /14 blisters of palms & diffuse rash Because of a history of documented adverse serious drug reaction;Medi Alert bracelet  is recommended   Amoxicillin Rash    Has patient had a PCN reaction causing IMMEDIATE RASH, FACIAL/TONGUE/THROAT SWELLING, SOB, OR LIGHTHEADEDNESS WITH HYPOTENSION:  #  #  #  YES  #  #  #  Has patient had a PCN reaction causing severe rash involving mucus membranes or skin necrosis: No Has patient had a PCN reaction that required hospitalization No Has patient had a PCN reaction occurring within the last 10 years: No If all of the above answers are "NO", then may proceed with Cephalosporin use.    Captopril Cough   Simvastatin Other (See Comments)    MYALGIAS BUTTOCKS CRAMP   Ciprofloxacin Other (See Comments)    Sores in mouth    Adhesive [Tape] Rash   Latex Rash     Patient states she gets red and a rash when latex touches her.    Medications Reviewed Today     Reviewed by Binnie Rail, MD (Physician) on 05/20/21 at 2010  Med List Status: <None>   Medication Order Taking? Sig Documenting Provider Last Dose Status Informant  amLODipine (NORVASC) 5 MG tablet [id number]  TAKE ONE TABLET BY MOUTH TWICE DAILY Burns, Claudina Lick, MD  Active   Ascorbic Acid (VITAMIN C) 250 MG CHEW [id number]  Chew 1,000 mg by mouth daily.  [provider]  Active Self  B Complex-C (SUPER B COMPLEX PO) [id number]  Take 1 tablet by mouth daily.  [provider]  Active Self  Blood Glucose Monitoring Suppl (ONE TOUCH ULTRA 2) w/Device KIT [id number]  Use as advised Philemon Kingdom, MD  Active Self  Cholecalciferol (VITAMIN D3) 2000 UNITS TABS [id number]  Take 2,000 Units by mouth daily.  [provider]  Active Self  diazepam (VALIUM) 5 MG tablet [id number]  TAKE ONE TABLET BY MOUTH AT BEDTIME AS NEEDED. Binnie Rail, MD  Active   fexofenadine [facility]) 180 MG tablet [id number]  Take 180 mg by mouth at bedtime. [provider]  Active Self  FLUoxetine (PROZAC) 10 MG capsule [id number]  TAKE ONE CAPSULE BY MOUTH DAILY Burns, Claudina Lick, MD  Active   furosemide (LASIX) 40 MG tablet [id number]  TAKE ONE-HALF TO ONE TABLET BY MOUTH DAILY AS NEEDED FOR IN FLUID. Binnie Rail, MD  Active   glipiZIDE (GLUCOTROL) 5 MG tablet [id number]  TAKE ONE TABLET BY MOUTH IN THE MORNING AND TAKE TWO TABLETS IN THE [place]on Hilding, Claudina Lick, MD  Active   labetalol (NORMODYNE) 300 MG tablet [id number]  Take 1 tablet (300 mg total) by mouth 3 (three) times daily. Binnie Rail, MD  Active   [facility] PAIN RELIEF PATCH EX) [id number]  Apply 1 patch topically daily as needed (back pain). [provider]  Active Self  losartan (COZAAR) 100 MG tablet [id number]  TAKE ONE TABLET BY MOUTH DAILY Quay Burow Claudina Lick, MD  Active   [facility] ULTRA test strip [id number]  CHECK BLOOD  SUGAR TWICE DAILY. Binnie Rail, MD  Active   oxyCODONE-acetaminophen (PERCOCET/ROXICET) 5-325 MG tablet [id number]  Take 1 tablet by mouth 3 (three) times daily as needed. [provider]  Active   PREVIDENT 5000 DRY MOUTH 1.1 % GEL dental gel [id number]  Place onto teeth. [provider]  Active   rosuvastatin (CRESTOR) 20 MG tablet [id number]  TAKE ONE TABLET BY MOUTH DAILY Burns, Claudina Lick, MD  Active   sitaGLIPtin (JANUVIA) 25 MG tablet [id number]  Take 1 tablet (25 mg total) by mouth daily. Binnie Rail, MD  Active   triamcinolone (KENALOG) 0.1 % [id number]  Apply 1 application topically 2 (two) times daily. Binnie Rail, MD  Active   Triamcinolone Acetonide (NASACORT ALLERGY 24HR NA) [id number]  Place 1 spray into the nose 2 (two) times daily as needed (congestion). [provider]  Active Self  vitamin B-12 (CYANOCOBALAMIN) 500 MCG tablet [id number]  Take 500 mcg by mouth daily. [provider]  Active Self  Vitamin E 180 MG CAPS [id number]  Take 180 Units by mouth daily.  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   CKD (chronic kidney disease) 05/21/2016   Sleep difficulties 01/18/2016   Diabetes mellitus ([place]) 10/23/2015   Numbness of toes-Right foot 06/26/2015   Colitis 03/19/2015   Nephrolithiasis 03/19/2015   AAA (abdominal aortic aneurysm) without rupture ([place]) 03/19/2015   Anxiety state 03/19/2015   Skin cancer 01/09/2015   Former smoker 05/24/2014   Bilateral renal cysts 09/16/2012   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[place]) 07/26/2012   Degenerative spondylolisthesis  07/26/2012   SHOULDER PAIN, LEFT, CHRONIC 07/25/2009   CARDIAC MURMUR, SYSTOLIC 43/15/4008   Mixed hyperlipidemia 12/30/2007   Tobacco use disorder 12/30/2007   Essential hypertension 12/30/2007   Subclavian steal syndrome 12/30/2007   CAROTID BRUIT 04/07/2007    Immunization History  Administered Date(s) Administered   Fluad Quad(high Dose 65+) 08/24/2019, 08/21/2020   Influenza Split 08/28/2011, 09/07/2012   Influenza Whole 09/06/2008, 08/30/2009, 10/11/2010   Influenza, High Dose Seasonal PF 08/24/2013, 08/18/2014, 08/07/2015, 08/08/2016, 07/31/2017, 08/10/2018   PFIZER(Purple Top)SARS-COV-2 Vaccination 11/29/2019, 12/19/2019, 11/16/2020   Pneumococcal Conjugate-13 09/26/2015   Pneumococcal Polysaccharide-23 12/10/2009    Conditions to be addressed/monitored:  Hypertension, Hyperlipidemia, Diabetes, Coronary Artery Disease, and Chronic Kidney Disease  Care Plan : [place]  Updates made by Charlton Haws, [place] since 06/25/2021 12:00 AM     Problem: Hypertension, Hyperlipidemia, Diabetes, Coronary Artery Disease, and Chronic Kidney Disease   Priority: High     Long-Range Goal: Disease management   Start Date: 06/25/2021  Expected End Date: 12/26/2021  This Visit's Progress: On track  Priority: High  Note:   Current Barriers:  Unable to independently monitor therapeutic efficacy Unable to maintain control of  diabetes  Pharmacist Clinical Goal(s):  Patient will achieve adherence to monitoring guidelines and medication adherence to achieve therapeutic efficacy adhere to plan to optimize therapeutic regimen for Diabetes as evidenced by report of adherence to recommended medication management changes through collaboration with PharmD and provider.   Interventions: 1:1 collaboration with Binnie Rail, MD regarding development and update of comprehensive plan of care as evidenced by provider attestation and co-signature Inter-disciplinary care team  collaboration (see longitudinal plan of care) Comprehensive medication review performed; medication list updated in electronic medical record  Hypertension (BP goal <140/90) -Controlled - BP is at goal in recent office visits -Current treatment: Amlodipine 5 mg BID Furosemide 40 mg - 1/2 to 1 tab daily PRN Labetalol 300 mg TID Losartan 100 mg daily -Educated on BP goals and benefits of medications for prevention of heart attack, stroke and kidney damage; -Counseled to monitor BP at home periodically, document, and provide log at future appointments -Recommended to continue current medication  Hyperlipidemia: (LDL goal < 70) -Not ideally controlled - LDL is at goal, triglycerides are elevated; pt has been stable on rosuvastatin 20 mg for ~4 years, GFR has steadily declined over that time (from ~50 to 17 ml/min) -Current treatment: Rosuvastatin 20 mg daily -Medications previously tried: simvastatin, pravastatin  -Educated on Cholesterol goals; Benefits of statin for ASCVD risk reduction; -Pt may benefit from changing rosuvastatin; atorvastatin is preferred high-intensity statin in CKD -Consider changing rosuvastatin to atorvastatin at follow up  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%) -Not ideally controlled - home BG elevated on Rybelsus 3 mg, pt was not able to pick up Rybelsus 7 mg due to donut hole cost; -Pt is aware PCP has prescribed Lantus, she has not picked it up yet -Current home glucose readings: 8/16 8am 201 8/15 10am 216  8/14 8am 176  11pm 248 8/11 9am 197  9pm 254 8/11 9am 197 8/10 9am 163 8/9    10pm 250 -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, 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, tacrolimus -Current medications: Lantus 10 units daily - not started yet Rybelsus 3 mg daily - unable to pick up 7 mg ($$$) Kerendia 10 mg daily -Medications previously tried: glipizide, metformin, Januvia, pioglitazone -Pt is at risk for low sugars if she increases Rybelsus and starts insulin at the same  time -Assessed pt finances - pt reports total household income is < $70,000 so she should qualify for pt assistance -Plan:  -Increase Rybelsus to 6 mg ($Rem'3mg'MzYZ$  x 2) daily - samples provided while pursuing pt assistance for 7 mg dose -Hold off on starting insulin. Will call pt in 1 week to check BG readings, may need to start insulin temporarily until Rybelsus dose can be titrated  Patient Goals/Self-Care Activities Patient will:  - take medications as prescribed focus on medication adherence by pill box check glucose AM and bedtime, document, and provide at future appointments collaborate with provider on medication access solutions - bring income documents for Rybelsus assistance      Medication Assistance:  Rybelsus - applied for [employer] PAP, expected approval 07/25/21 [place] Clamp - applied for [employer] PAP, expected approval 07/25/21  Compliance/Adherence/Medication fill history: Care Gaps: Hep C screening  Star-Rating Drugs: Rybelsus - LF 05/25/21 x 30 ds Rosuvastatin - LF 03/29/21 x 90 ds Losartan - LF 03/29/21 x 90 ds  Patient's preferred pharmacy is:  [facility], [place] [address] Phone: [phone] Fax: [place] -  [place], [place] [address] Phone: [phone] Fax: [phone]  Uses pill box? Yes Pt endorses 100% compliance  We discussed: Current pharmacy is preferred with insurance plan and patient is satisfied with pharmacy services Patient decided to: Continue current medication management strategy  Care Plan and Follow Up Patient Decision:  Patient agrees to Care Plan and Follow-up.  Plan: Telephone follow up appointment with care management team member scheduled for:  1 week  Charlene Brooke, PharmD, [place], CPP Clinical Pharmacist [facility] [phone]

## 2021-06-25 NOTE — Patient Instructions (Signed)
Visit Information  Phone number for Pharmacist: [phone]   Goals Addressed             This Visit's Progress    Monitor and Manage My Blood Sugar-Diabetes Type 2       Timeframe:  Long-Range Goal Priority:  High Start Date:      06/25/21                       Expected End Date:      08/09/21                 Follow Up Date 07/09/21   - check blood sugar at prescribed times - check blood sugar if I feel it is too high or too low - take the blood sugar meter to all doctor visits -Increase Rybelsus to 6 mg (3 mg x 2 tablets) daily - samples provided -Bring income documents to office and sign Rybelsus application -Hold off on insulin for now    Why is this important?   Checking your blood sugar at home helps to keep it from getting very high or very low.  Writing the results in a diary or log helps the doctor know how to care for you.  Your blood sugar log should have the time, date and the results.  Also, write down the amount of insulin or other medicine that you take.  Other information, like what you ate, exercise done and how you were feeling, will also be helpful.     Notes:         Care Plan : CCM Pharmacy Care Plan  Updates made by Charlton Haws, RPH since 06/25/2021 12:00 AM     Problem: Hypertension, Hyperlipidemia, Diabetes, Coronary Artery Disease, and Chronic Kidney Disease   Priority: High     Long-Range Goal: Disease management   Start Date: 06/25/2021  Expected End Date: 12/26/2021  This Visit's Progress: On track  Priority: High  Note:   Current Barriers:  Unable to independently monitor therapeutic efficacy Unable to maintain control of diabetes  Pharmacist Clinical Goal(s):  Patient will achieve adherence to monitoring guidelines and medication adherence to achieve therapeutic efficacy adhere to plan to optimize therapeutic regimen for Diabetes as evidenced by report of adherence to recommended medication management changes through  collaboration with PharmD and provider.   Interventions: 1:1 collaboration with Binnie Rail, MD regarding development and update of comprehensive plan of care as evidenced by provider attestation and co-signature Inter-disciplinary care team collaboration (see longitudinal plan of care) Comprehensive medication review performed; medication list updated in electronic medical record  Hypertension (BP goal <140/90) -Controlled - BP is at goal in recent office visits -Current treatment: Amlodipine 5 mg BID Furosemide 40 mg - 1/2 to 1 tab daily PRN Labetalol 300 mg TID Losartan 100 mg daily -Educated on BP goals and benefits of medications for prevention of heart attack, stroke and kidney damage; -Counseled to monitor BP at home periodically, document, and provide log at future appointments -Recommended to continue current medication  Hyperlipidemia: (LDL goal < 70) -Not ideally controlled - LDL is at goal, triglycerides are elevated; pt has been stable on rosuvastatin 20 mg for ~4 years, GFR has steadily declined over that time (from ~50 to 17 ml/min) -Current treatment: Rosuvastatin 20 mg daily -Medications previously tried: simvastatin, pravastatin  -Educated on Cholesterol goals; Benefits of statin for ASCVD risk reduction; -Pt may benefit from changing rosuvastatin; atorvastatin is preferred high-intensity statin in  CKD -Consider changing rosuvastatin to atorvastatin at follow up  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%) -Not ideally controlled - home BG elevated on Rybelsus 3 mg, pt was not able to pick up Rybelsus 7 mg due to donut hole cost; -Pt is aware PCP has prescribed Lantus, she has not picked it up yet -Current home glucose readings: 8/16 8am 201 8/15 10am 216  8/14 8am 176  11pm 248 8/11 9am 197  9pm 254 8/11 9am 197 8/10 9am 163 8/9    10pm 250 -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, 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, tacrolimus -Current medications: Lantus 10 units daily - not started  yet Rybelsus 3 mg daily - unable to pick up 7 mg ($$$) Kerendia 10 mg daily -Medications previously tried: glipizide, metformin, Januvia, pioglitazone -Pt is at risk for low sugars if she increases Rybelsus and starts insulin at the same time -Assessed pt finances - pt reports total household income is < $70,000 so she should qualify for pt assistance -Plan:  -Increase Rybelsus to 6 mg (3mg  x 2) daily - samples provided while pursuing pt assistance for 7 mg dose -Hold off on starting insulin. Will call pt in 1 week to check BG readings, may need to start insulin temporarily until Rybelsus dose can be titrated  Patient Goals/Self-Care Activities Patient will:  - take medications as prescribed focus on medication adherence by pill box check glucose AM and bedtime, document, and provide at future appointments collaborate with provider on medication access solutions - bring income documents for Rybelsus assistance      Patient verbalizes understanding of instructions provided today and agrees to view in [place].  Telephone follow up appointment with pharmacy team member scheduled for: [place], PharmD, [place], CPP Clinical Pharmacist [facility] [phone]

## 2021-06-27 ENCOUNTER — Ambulatory Visit
Admission: RE | Admit: 2021-06-27 | Discharge: 2021-06-27 | Disposition: A | Discharge status: Home / Self Care | Payer: PPO | Source: Ambulatory Visit | Attending: Vascular Surgery | Admitting: Vascular Surgery

## 2021-06-27 ENCOUNTER — Other Ambulatory Visit: Payer: Self-pay

## 2021-06-27 DIAGNOSIS — I714 Abdominal aortic aneurysm, without rupture, unspecified: Secondary | ICD-10-CM

## 2021-06-27 DIAGNOSIS — N200 Calculus of kidney: Secondary | ICD-10-CM | POA: Diagnosis not present

## 2021-06-27 DIAGNOSIS — K5730 Diverticulosis of large intestine without perforation or abscess without bleeding: Secondary | ICD-10-CM | POA: Diagnosis not present

## 2021-07-02 ENCOUNTER — Ambulatory Visit: Payer: PPO | Admitting: Vascular Surgery

## 2021-07-02 ENCOUNTER — Telehealth: Payer: PPO

## 2021-07-03 ENCOUNTER — Ambulatory Visit: Payer: PPO | Admitting: Vascular Surgery

## 2021-07-04 ENCOUNTER — Other Ambulatory Visit: Payer: Self-pay

## 2021-07-04 ENCOUNTER — Telehealth: Payer: Self-pay | Admitting: Pharmacist

## 2021-07-04 ENCOUNTER — Ambulatory Visit: Payer: PPO | Admitting: Pharmacist

## 2021-07-04 DIAGNOSIS — E1159 Type 2 diabetes mellitus with other circulatory complications: Secondary | ICD-10-CM

## 2021-07-04 DIAGNOSIS — E782 Mixed hyperlipidemia: Secondary | ICD-10-CM

## 2021-07-04 NOTE — Patient Instructions (Signed)
Visit Information  Phone number for Pharmacist: [phone]   Goals Addressed             This Visit's Progress    Monitor and Manage My Blood Sugar-Diabetes Type 2       Timeframe:  Long-Range Goal Priority:  High Start Date:      06/25/21                       Expected End Date:      08/09/21                 Follow Up Date Sept 2022   - check blood sugar at prescribed times - check blood sugar if I feel it is too high or too low - take the blood sugar meter to all doctor visits -Restart glipizide 5 mg with dinner   Why is this important?   Checking your blood sugar at home helps to keep it from getting very high or very low.  Writing the results in a diary or log helps the doctor know how to care for you.  Your blood sugar log should have the time, date and the results.  Also, write down the amount of insulin or other medicine that you take.  Other information, like what you ate, exercise done and how you were feeling, will also be helpful.     Notes:         Care Plan : CCM Pharmacy Care Plan  Updates made by Charlton Haws, RPH since 07/04/2021 12:00 AM     Problem: Hypertension, Hyperlipidemia, Diabetes, Coronary Artery Disease, and Chronic Kidney Disease   Priority: High     Long-Range Goal: Disease management   Start Date: 06/25/2021  Expected End Date: 12/26/2021  This Visit's Progress: On track  Recent Progress: On track  Priority: High  Note:   Current Barriers:  Unable to independently monitor therapeutic efficacy Unable to maintain control of diabetes  Pharmacist Clinical Goal(s):  Patient will achieve adherence to monitoring guidelines and medication adherence to achieve therapeutic efficacy adhere to plan to optimize therapeutic regimen for Diabetes as evidenced by report of adherence to recommended medication management changes through collaboration with PharmD and provider.   Interventions: 1:1 collaboration with Binnie Rail, MD  regarding development and update of comprehensive plan of care as evidenced by provider attestation and co-signature Inter-disciplinary care team collaboration (see longitudinal plan of care) Comprehensive medication review performed; medication list updated in electronic medical record  Hyperlipidemia: (LDL goal < 70) -Not ideally controlled - LDL is at goal, triglycerides are elevated; pt has been stable on rosuvastatin 20 mg for ~4 years, GFR has steadily declined over that time (from ~50 to 17 ml/min) -Current treatment: Rosuvastatin 20 mg daily -Pt may benefit from changing rosuvastatin; atorvastatin is preferred high-intensity statin with GFR < 30 -Consider changing rosuvastatin to atorvastatin at follow up  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%) -Not ideally controlled - pt has been taking Rybelsus 3 mg x 2 tablets for ~a week and fasting BG has mildly improved; she is not willing to start insulin as this time; she does have some glipizide 5 mg tablets left over after it was discontinued in July -Current home glucose readings: 8/25 8am 178 8/24 8am 164  9pm 218 8/23 8am 176  11pm 323 8/22 8am 204  9pm  245 8/21 10am 176 9pm  295 8/20 9am 176  9pm 252 8/19  9pm 177 8/17: started Rybelsus 3 mg x 2   8/16 8am 201 8/15 10am 216  8/14 8am 176  11pm 248 8/11 9am 197  9pm 254 8/11 9am 197 8/10 9am 163 -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, 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, tacrolimus -Current medications: Lantus 10 units daily - not started yet Rybelsus 3 mg x 2 tablets daily (samples given 8/16) Kerendia 10 mg daily -Medications previously tried: glipizide, metformin, Januvia, pioglitazone -Discussed Rybelsus 3 mg x 2 tablets is not as effective as 7 mg tablets will be due to pharmacokinetics of the drug- would expect BG to improve more significantly with 7 mg tablet -Counseled on need for a second agent to control BG until Rybelsus 7 mg is available to patient; pt declined to start insulin; she does  still have glipizide 5 mg tablets on hand -Plan:  -Continue Rybelsus 3 mg x 2 tablets daily until Rybelsus 7 mg is approved via PAP -Restart glipizide 5 mg with dinner (short term)  Patient Goals/Self-Care Activities Patient will:  - take medications as prescribed -focus on medication adherence by pill box -check glucose AM and bedtime -collaborate with provider on medication access solutions (Rybelsus) -Restart glipizide 5 mg with dinner (short term, until Rybelsus 7 mg is available)      Patient verbalizes understanding of instructions provided today and agrees to view in [place].  Telephone follow up appointment with pharmacy team member scheduled for: 1 month  Charlene Brooke, PharmD, [employer], CPP Clinical Pharmacist [facility] [phone]

## 2021-07-04 NOTE — Progress Notes (Signed)
Chronic Care Management Pharmacy Note  07/04/2021 Name:  Sheila Melton MRN:  [id number] DOB:  09/25/1941  Summary: -Home BG improved slightly (fasting BG 170s, HS BG 200s) with Rybelsus 3 mg x 2 tablets daily (samples provided previously while awaiting approval of 7 mg tablets from pt assistance) -Pt is not willing to start insulin at this time. She does still have glipizide 5 mg tablets on hand.  Recommendations/Changes made from today's visit: -Continue Rybelsus 3 mg x 2 tablets daily -Restart Glipizide 5 mg with dinner (short term until Rybelsus 7 mg approved)   Subjective: Sheila Melton is an 80 y.o. year old female who is a primary patient of Burns, Claudina Lick, MD.  The CCM team was consulted for assistance with disease management and care coordination needs.    Engaged with patient by telephone for follow up visit in response to provider referral for pharmacy case management and/or care coordination services.   Consent to Services:  The patient was given information about Chronic Care Management services, agreed to services, and gave verbal consent prior to initiation of services.  Please see initial visit note for detailed documentation.   Patient Care Team: Binnie Rail, MD as PCP - General (Internal Medicine) Wellington Hampshire, MD as PCP - Cardiology (Cardiology) Elmarie Shiley, MD as Consulting Physician (Nephrology) Charlton Haws, [facility] as Pharmacist (Pharmacist)  Recent office visits: 05/21/21 Dr Quay Burow OV: chronic f/u; A1c 7.5, GFR 17. Stop glipizide, Januvia. Start Rybelsus 3 mg.   Recent consult visits: 03/12/21 Dr Posey Pronto (nephrology): started Kerendia 10 mg daily  Hospital visits: None in previous 6 months  Objective:  Lab Results  Component Value Date   CREATININE 2.52 (H) 05/21/2021   BUN 41 (H) 05/21/2021   GFR 17.81 (L) 05/21/2021   GFRNONAA 46 (L) 02/19/2017   GFRAA 53 (L) 02/19/2017   NA 136 05/21/2021   K 4.1 05/21/2021   CALCIUM 9.5 05/21/2021   CO2  23 05/21/2021   GLUCOSE 178 (H) 05/21/2021    Lab Results  Component Value Date/Time   HGBA1C 7.5 (H) 05/21/2021 02:24 PM   HGBA1C 7.8 (H) 11/21/2020 03:13 PM   FRUCTOSAMINE 332 (H) 04/25/2015 12:52 PM   GFR 17.81 (L) 05/21/2021 02:24 PM   GFR 19.24 (L) 11/21/2020 03:13 PM   MICROALBUR 194.0 Verified by manual dilution. (H) 11/16/2014 12:05 PM   MICROALBUR 95.5 (H) 05/09/2014 11:28 AM    Last diabetic Eye exam:  Lab Results  Component Value Date/Time   HMDIABEYEEXA No Retinopathy 10/29/2020 12:00 AM    Last diabetic Foot exam: No results found for: HMDIABFOOTEX   Lab Results  Component Value Date   CHOL 190 05/21/2021   HDL 36.40 (L) 05/21/2021   LDLCALC 70 05/21/2020   LDLDIRECT 62.0 05/21/2021   TRIG (H) 05/21/2021    463.0 Triglyceride is over 400; calculations on Lipids are invalid.   CHOLHDL 5 05/21/2021    Hepatic Function Latest Ref Rng & Units 05/21/2021 11/21/2020 09/07/2020  Total Protein 6.0 - 8.3 g/dL 7.0 7.3 6.8  Albumin 3.5 - 5.2 g/dL 4.1 4.2 -  AST 0 - 37 U/L $Remo'17 16 16  'ZaNJE$ ALT 0 - 35 U/L $Remo'12 12 11  'cErrf$ Alk Phosphatase 39 - 117 U/L 71 100 -  Total Bilirubin 0.2 - 1.2 mg/dL 0.3 0.3 0.2  Bilirubin, Direct 0.0 - 0.3 mg/dL - - -    Lab Results  Component Value Date/Time   TSH 3.01 11/21/2020 03:13 PM   TSH  2.95 11/17/2018 03:03 PM   FREET4 0.78 11/27/2011 01:53 PM   FREET4 0.8 12/31/2007 08:13 AM    CBC Latest Ref Rng & Units 11/21/2020 05/21/2020 11/17/2018  WBC 4.0 - 10.5 K/uL 12.6(H) 11.2(H) 11.3(H)  Hemoglobin 12.0 - 15.0 g/dL 11.6(L) 10.8(L) 11.7(L)  Hematocrit 36.0 - 46.0 % 34.7(L) 32.6(L) 35.4(L)  Platelets 150.0 - 400.0 K/uL 350.0 318 349.0    Lab Results  Component Value Date/Time   VD25OH 32 09/07/2020 04:19 PM   VD25OH 87 10/11/2010 08:57 PM    Clinical ASCVD: Yes  The 10-year ASCVD risk score Mikey Bussing DC Jr., et al., 2013) is: 39.9%   Values used to calculate the score:     Age: 77 years     Sex: Female     Is Non-Hispanic African American: No      Diabetic: Yes     Tobacco smoker: Yes     Systolic Blood Pressure: 400 mmHg     Is BP treated: Yes     HDL Cholesterol: 36.4 mg/dL     Total Cholesterol: 190 mg/dL    Depression screen [facility] 2/9 09/14/2020 09/07/2020 11/17/2018  Decreased Interest 0 0 0  Down, Depressed, Hopeless 0 0 0  PHQ - 2 Score 0 0 0  Altered sleeping - - -  Tired, decreased energy - - -  Change in appetite - - -  Feeling bad or failure about yourself  - - -  Trouble concentrating - - -  Moving slowly or fidgety/restless - - -  Suicidal thoughts - - -  PHQ-9 Score - - -  Some recent data might be hidden     Social History   Tobacco Use  Smoking Status Light Smoker   Packs/day: 1.00   Years: 50.00   Pack years: 50.00   Types: E-cigarettes, Cigarettes  Smokeless Tobacco Never  Tobacco Comments   e-cigs   BP Readings from Last 3 Encounters:  05/21/21 110/68  12/31/20 116/70  11/21/20 122/70   Pulse Readings from Last 3 Encounters:  05/21/21 78  12/31/20 80  11/21/20 83   Wt Readings from Last 3 Encounters:  05/21/21 158 lb (71.7 kg)  12/31/20 158 lb (71.7 kg)  11/21/20 159 lb (72.1 kg)   BMI Readings from Last 3 Encounters:  05/21/21 27.12 kg/m  12/31/20 27.12 kg/m  11/21/20 27.72 kg/m    Assessment/Interventions: Review of patient past medical history, allergies, medications, health status, including review of consultants reports, laboratory and other test data, was performed as part of comprehensive evaluation and provision of chronic care management services.   SDOH:  (Social Determinants of Health) assessments and interventions performed: Yes  SDOH Screenings   Alcohol Screen: Low Risk    Last Alcohol Screening Score (AUDIT): 0  Depression (PHQ2-9): Low Risk    PHQ-2 Score: 0  Financial Resource Strain: Low Risk    Difficulty of Paying Living Expenses: Not hard at all  Food Insecurity: No Food Insecurity   Worried About Charity fundraiser in the Last Year: Never true   Ran  Out of Food in the Last Year: Never true  Housing: Low Risk    Last Housing Risk Score: 0  Physical Activity: Inactive   Days of Exercise per Week: 0 days   Minutes of Exercise per Session: 0 min  Social Connections: Engineer, building services of Communication with Friends and Family: More than three times a week   Frequency of Social Gatherings with Friends and Family: Once  a week   Attends Religious Services: More than 4 times per year   Active Member of Clubs or Organizations: No   Attends Music therapist: More than 4 times per year   Marital Status: Married  Stress: No Stress Concern Present   Feeling of Stress : Not at all  Tobacco Use: High Risk   Smoking Tobacco Use: Light Smoker   Smokeless Tobacco Use: Never  Transportation Needs: No Transportation Needs   Lack of Transportation (Medical): No   Lack of Transportation (Non-Medical): No    CCM Care Plan  Allergies  Allergen Reactions   Rocephin [Ceftriaxone Sodium In Dextrose] Dermatitis and Rash    10/24 /14 blisters of palms & diffuse rash Because of a history of documented adverse serious drug reaction;Medi Alert bracelet  is recommended   Amoxicillin Rash    Has patient had a PCN reaction causing IMMEDIATE RASH, FACIAL/TONGUE/THROAT SWELLING, SOB, OR LIGHTHEADEDNESS WITH HYPOTENSION:  #  #  #  YES  #  #  #  Has patient had a PCN reaction causing severe rash involving mucus membranes or skin necrosis: No Has patient had a PCN reaction that required hospitalization No Has patient had a PCN reaction occurring within the last 10 years: No If all of the above answers are "NO", then may proceed with Cephalosporin use.    Captopril Cough   Simvastatin Other (See Comments)    MYALGIAS BUTTOCKS CRAMP   Ciprofloxacin Other (See Comments)    Sores in mouth    Adhesive [Tape] Rash   Latex Rash    Patient states she gets red and a rash when latex touches her.    Medications Reviewed Today      Reviewed by Charlton Haws, [facility] (Pharmacist) on 06/25/21 at 1345  Med List Status: <None>   Medication Order Taking? Sig Documenting Provider Last Dose Status Informant  amLODipine (NORVASC) 5 MG tablet [id number] Yes TAKE ONE TABLET BY MOUTH TWICE DAILY Burns, Claudina Lick, MD Taking Active   Ascorbic Acid (VITAMIN C) 250 MG CHEW [id number] Yes Chew 1,000 mg by mouth daily.  [provider] Taking Active Self  ascorbic Acid (VITAMIN C) 500 MG CPCR [id number] Yes 1 cap PO QD [provider] Taking Active   B Complex-C (SUPER B COMPLEX PO) [id number] Yes Take 1 tablet by mouth daily.  [provider] Taking Active Self  Blood Glucose Monitoring Suppl (ONE TOUCH ULTRA 2) w/Device KIT [id number] Yes Use as advised Philemon Kingdom, MD Taking Active Self  Cholecalciferol (VITAMIN D3) 2000 UNITS TABS [id number] Yes Take 2,000 Units by mouth daily.  [provider] Taking Active Self  Cholecalciferol 125 MCG (5000 UT) TABS [id number] Yes 1 cap PO QD [provider] Taking Active   diazepam (VALIUM) 5 MG tablet [id number] Yes TAKE ONE TABLET BY MOUTH AT BEDTIME AS NEEDED. Binnie Rail, MD Taking Active   fexofenadine [facility]) 180 MG tablet [id number] Yes Take 180 mg by mouth at bedtime. [provider] Taking Active Self  FLUoxetine (PROZAC) 10 MG capsule [id number] Yes TAKE ONE CAPSULE BY MOUTH DAILY Burns, Claudina Lick, MD Taking Active   furosemide (LASIX) 40 MG tablet [id number] Yes TAKE ONE-HALF TO ONE TABLET BY MOUTH DAILY AS NEEDED FOR IN FLUID. Binnie Rail, MD Taking Active   insulin glargine (LANTUS SOLOSTAR) 100 UNIT/ML Solostar Pen [id number] No Inject 10 Units into the skin daily.  Patient not taking: Reported on 06/25/2021   Binnie Rail, MD Not  Taking Active   Insulin Pen Needle (PEN NEEDLES) 32G X 5 MM MISC [id number] Yes UAD for insulin pen Binnie Rail, MD Taking Active   KERENDIA 10 MG TABS [id number] Yes Take 1 tablet by mouth daily. [provider] Taking Active   labetalol (NORMODYNE) 300 MG tablet [id number] Yes Take 1 tablet (300 mg total) by mouth 3 (three) times daily. Binnie Rail, MD Taking Active   [facility] PAIN RELIEF PATCH EX) [id number] Yes Apply 1 patch topically daily as needed (back pain). [provider] Taking Active Self  losartan (COZAAR) 100 MG tablet [id number] Yes TAKE ONE TABLET BY MOUTH DAILY Quay Burow, Claudina Lick, MD Taking Active   [facility] ULTRA test strip [id number] Yes CHECK BLOOD SUGAR TWICE DAILY. Binnie Rail, MD Taking Active   oxyCODONE-acetaminophen (PERCOCET/ROXICET) 5-325 MG tablet [id number] Yes Take 1 tablet by mouth 3 (three) times daily as needed. [provider] Taking Active   PREVIDENT 5000 DRY MOUTH 1.1 % GEL dental gel [id number] Yes Place onto teeth. [provider] Taking Active   rosuvastatin (CRESTOR) 20 MG tablet [id number] Yes TAKE ONE TABLET BY MOUTH DAILY Burns, Claudina Lick, MD Taking Active   Semaglutide (RYBELSUS) 7 MG TABS [id number]  Take 7 mg by mouth daily. Via [employer] pt assistance Binnie Rail, MD  Active   triamcinolone (KENALOG) 0.1 % [id number] Yes Apply 1 application topically 2 (two) times daily. Binnie Rail, MD Taking Active   Triamcinolone Acetonide (NASACORT ALLERGY 24HR NA) [id number] Yes Place 1 spray into the nose 2 (two) times daily as needed (congestion). [provider] Taking Active Self  vitamin B-12 (CYANOCOBALAMIN) 500 MCG tablet [id number] Yes Take 500 mcg by mouth daily. [provider] Taking Active Self  Vitamin E 180 MG CAPS [id number] Yes Take 180 Units by mouth daily.  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/21/2016   CKD (chronic kidney disease) 05/21/2016   Sleep difficulties 01/18/2016   Diabetes mellitus ([place]) 10/23/2015   Numbness of toes-Right foot 06/26/2015   Colitis 03/19/2015   Nephrolithiasis 03/19/2015   AAA (abdominal aortic aneurysm) without rupture ([place]) 03/19/2015   Anxiety state 03/19/2015   Skin cancer 01/09/2015   Former smoker 05/24/2014   Bilateral renal cysts 09/16/2012   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   Degenerative spondylolisthesis 07/26/2012   SHOULDER PAIN, LEFT, CHRONIC 07/25/2009   CARDIAC MURMUR, SYSTOLIC 16/60/6004   Mixed hyperlipidemia 12/30/2007   Tobacco use disorder 12/30/2007   Essential hypertension 12/30/2007   Subclavian steal syndrome 12/30/2007   CAROTID BRUIT 04/07/2007    Immunization History  Administered Date(s) Administered   Fluad Quad(high Dose 65+) 08/24/2019, 08/21/2020   Influenza Split 08/28/2011, 09/07/2012   Influenza Whole 09/06/2008, 08/30/2009, 10/11/2010   Influenza, High Dose Seasonal PF 08/24/2013, 08/18/2014, 08/07/2015, 08/08/2016, 07/31/2017, 08/10/2018   PFIZER(Purple Top)SARS-COV-2 Vaccination 11/29/2019, 12/19/2019, 11/16/2020   Pneumococcal Conjugate-13 09/26/2015   Pneumococcal Polysaccharide-23 12/10/2009    Conditions to be addressed/monitored:  Hypertension, Hyperlipidemia, Diabetes, Coronary Artery Disease, and Chronic Kidney Disease  Care Plan : [place]  Updates made  by Charlton Haws, RPH since 07/04/2021 12:00 AM     Problem: Hypertension, Hyperlipidemia, Diabetes, Coronary Artery Disease, and Chronic Kidney Disease   Priority: High     Long-Range Goal: Disease management   Start Date: 06/25/2021  Expected End Date: 12/26/2021  This Visit's Progress: On track  Recent Progress: On track  Priority: High  Note:   Current Barriers:  Unable to independently monitor therapeutic  efficacy Unable to maintain control of diabetes  Pharmacist Clinical Goal(s):  Patient will achieve adherence to monitoring guidelines and medication adherence to achieve therapeutic efficacy adhere to plan to optimize therapeutic regimen for Diabetes as evidenced by report of adherence to recommended medication management changes through collaboration with PharmD and provider.   Interventions: 1:1 collaboration with Binnie Rail, MD regarding development and update of comprehensive plan of care as evidenced by provider attestation and co-signature Inter-disciplinary care team collaboration (see longitudinal plan of care) Comprehensive medication review performed; medication list updated in electronic medical record  Hyperlipidemia: (LDL goal < 70) -Not ideally controlled - LDL is at goal, triglycerides are elevated; pt has been stable on rosuvastatin 20 mg for ~4 years, GFR has steadily declined over that time (from ~50 to 17 ml/min) -Current treatment: Rosuvastatin 20 mg daily -Pt may benefit from changing rosuvastatin; atorvastatin is preferred high-intensity statin with GFR < 30 -Consider changing rosuvastatin to atorvastatin at follow up  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%) -Not ideally controlled - pt has been taking Rybelsus 3 mg x 2 tablets for ~a week and fasting BG has mildly improved; she is not willing to start insulin as this time; she does have some glipizide 5 mg tablets left over after it was discontinued in July -Current home glucose readings: 8/25 8am 178 8/24 8am 164  9pm 218 8/23 8am 176  11pm 323 8/22 8am 204  9pm  245 8/21 10am 176 9pm  295 8/20 9am 176  9pm 252 8/19   9pm 177 8/17: started Rybelsus 3 mg x 2   8/16 8am 201 8/15 10am 216  8/14 8am 176  11pm 248 8/11 9am 197  9pm 254 8/11 9am 197 8/10 9am 163 -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, 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, tacrolimus -Current medications: Lantus 10 units daily - not started yet Rybelsus 3 mg x 2  tablets daily (samples given 8/16) Kerendia 10 mg daily -Medications previously tried: glipizide, metformin, Januvia, pioglitazone -Discussed Rybelsus 3 mg x 2 tablets is not as effective as 7 mg tablets will be due to pharmacokinetics of the drug- would expect BG to improve more significantly with 7 mg tablet -Counseled on need for a second agent to control BG until Rybelsus 7 mg is available to patient; pt declined to start insulin; she does still have glipizide 5 mg tablets on hand -Plan:  -Continue Rybelsus 3 mg x 2 tablets daily until Rybelsus 7 mg is approved via PAP -Restart glipizide 5 mg with dinner (short term)  Patient Goals/Self-Care Activities Patient will:  - take medications as prescribed -focus on medication adherence by pill box -check glucose AM and bedtime -collaborate with provider on medication access solutions (Rybelsus) -Restart glipizide 5 mg with dinner (short term, until Rybelsus 7 mg is available)       Medication Assistance:  Rybelsus - applied for [employer] PAP, expected approval 07/25/21 [place] Clamp - applied for [employer] PAP, expected approval 07/25/21  Compliance/Adherence/Medication fill history: Care Gaps: Hep C screening  Star-Rating Drugs: Rybelsus - LF 05/25/21 x  30 ds Rosuvastatin - LF 03/29/21 x 90 ds Losartan - LF 03/29/21 x 90 ds  Patient's preferred pharmacy is:  [place], [address] [place] [address] Phone: [phone] Fax: [place], [place] [address] Phone: [phone] Fax: [phone]  Uses pill box? Yes Pt endorses 100% compliance  We discussed: Current pharmacy is preferred with insurance plan and patient is satisfied with pharmacy services Patient decided to: Continue current medication management strategy  Care Plan and Follow Up Patient  Decision:  Patient agrees to Care Plan and Follow-up.  Plan: Telephone follow up appointment with care management team member scheduled for:  1 month  Charlene Brooke, PharmD, [place], CPP Clinical Pharmacist [facility] [phone]

## 2021-07-04 NOTE — Progress Notes (Signed)
[place]  to follow up on patient assistance application for Rybelsus. Per representative at [employer] they are missing patient portion of the application.  They are still missing income verification.  Orinda Kenner, [place] Clinical Pharmacists Assistant [phone]  Time Spent: [phone]

## 2021-07-08 NOTE — Telephone Encounter (Signed)
Re-faxed application 7/39

## 2021-07-10 ENCOUNTER — Other Ambulatory Visit: Payer: Self-pay

## 2021-07-10 ENCOUNTER — Encounter: Payer: Self-pay | Admitting: Vascular Surgery

## 2021-07-10 ENCOUNTER — Ambulatory Visit: Payer: PPO | Admitting: Vascular Surgery

## 2021-07-10 VITALS — BP 112/70 | HR 84 | Temp 98.1°F | Ht 64.0 in | Wt 155.6 lb

## 2021-07-10 DIAGNOSIS — N184 Chronic kidney disease, stage 4 (severe): Secondary | ICD-10-CM

## 2021-07-10 DIAGNOSIS — I10 Essential (primary) hypertension: Secondary | ICD-10-CM | POA: Diagnosis not present

## 2021-07-10 DIAGNOSIS — E1159 Type 2 diabetes mellitus with other circulatory complications: Secondary | ICD-10-CM | POA: Diagnosis not present

## 2021-07-10 DIAGNOSIS — E782 Mixed hyperlipidemia: Secondary | ICD-10-CM | POA: Diagnosis not present

## 2021-07-10 DIAGNOSIS — I714 Abdominal aortic aneurysm, without rupture, unspecified: Secondary | ICD-10-CM

## 2021-07-10 NOTE — Progress Notes (Signed)
[facility] [place]  Patient name: Sheila Melton MRN: [id number] DOB: Feb 01, 1941 Sex: female  REASON FOR VISIT: Follow-up known infrarenal abdominal aortic aneurysm  HPI: Sheila Melton is a 80 y.o. female here today for follow-up.  She is here today with her husband.  She reports no new major medical difficulties.  She does continue to have severe degenerative disc disease.  She has a spinal cord stimulator with possibly some relief from this.  She has no symptoms referable to her aneurysm.  She underwent recent noncontrast CT for continued follow-up and is here today for discussion of this  Past Medical History:  Diagnosis Date   AAA (abdominal aortic aneurysm) ([facility])    BEING WATCHED   VVS. 4.2 cm infrarenal abdominal aortic aneurysm without rupture noted 11/08/2016   Arthritis    Cancer ([place])    skin cancer removed from left shoulder  2017   CAP (community acquired pneumonia) 09/02/13-11/05/13    with SIRS   Depression    ?? from prednisone    not on pred now   Diabetes mellitus    type ii    long time ago.   Family history of anesthesia complication    PATIENTS MOM HAD TROUBLE WAKING UP    GERD (gastroesophageal reflux disease)    will take occasional tums   Heart murmur    still has.   Sees Dr. Stanford Breed   History of kidney stones    has them at the present time   Hyperlipidemia    Hypertension    unspecified essential   Renal insufficiency    "FSGS"   Subclavian steal syndrome    LEFT SIDE   NO SIDE EFFECTS   Subclavian steal syndrome --  left    85% blocked   UTI (lower urinary tract infection) 08/26/13   Enterococcus and Escherichia coli both sensitive to nitrofurantoin    Family History  Problem Relation Age of Onset   Heart failure Mother    Heart disease Mother    Hypertension Mother    Other Mother        varicose veins   Deep vein thrombosis Mother    Varicose Veins Mother    Heart attack Mother     Hypertension Sister        X 2   Diabetes Sister    Hyperlipidemia Sister    Other Sister        varicose veins   Heart disease Father    Diabetes Father    Hyperlipidemia Father    Hypertension Father    Heart attack Father    Heart disease Brother        MI in late 31s   Hyperlipidemia Brother    Heart attack Brother    Coronary artery disease Other    Diabetes Other    Parkinsonism Brother    Heart failure Brother    Hypertension Daughter     SOCIAL HISTORY: Social History   Tobacco Use   Smoking status: Light Smoker    Packs/day: 1.00    Years: 50.00    Pack years: 50.00    Types: E-cigarettes, Cigarettes   Smokeless tobacco: Never   Tobacco comments:    e-cigs  Substance Use Topics   Alcohol use: No    Allergies  Allergen Reactions   Rocephin [Ceftriaxone Sodium In Dextrose] Dermatitis and Rash    10/24 /14 blisters of palms & diffuse rash Because of a history  of documented adverse serious drug reaction;Medi Alert bracelet  is recommended   Amoxicillin Rash    Has patient had a PCN reaction causing IMMEDIATE RASH, FACIAL/TONGUE/THROAT SWELLING, SOB, OR LIGHTHEADEDNESS WITH HYPOTENSION:  #  #  #  YES  #  #  #  Has patient had a PCN reaction causing severe rash involving mucus membranes or skin necrosis: No Has patient had a PCN reaction that required hospitalization No Has patient had a PCN reaction occurring within the last 10 years: No If all of the above answers are "NO", then may proceed with Cephalosporin use.    Captopril Cough   Simvastatin Other (See Comments)    MYALGIAS BUTTOCKS CRAMP   Ciprofloxacin Other (See Comments)    Sores in mouth    Adhesive [Tape] Rash   Latex Rash    Patient states she gets red and a rash when latex touches her.    Current Outpatient Medications  Medication Sig Dispense Refill   amLODipine (NORVASC) 5 MG tablet TAKE ONE TABLET BY MOUTH TWICE DAILY 180 tablet 0   Ascorbic Acid (VITAMIN C) 250 MG CHEW Chew  1,000 mg by mouth daily.      ascorbic Acid (VITAMIN C) 500 MG CPCR 1 cap PO QD     B Complex-C (SUPER B COMPLEX PO) Take 1 tablet by mouth daily.      Blood Glucose Monitoring Suppl (ONE TOUCH ULTRA 2) w/Device KIT Use as advised 1 each 0   Cholecalciferol (VITAMIN D3) 2000 UNITS TABS Take 2,000 Units by mouth daily.      Cholecalciferol 125 MCG (5000 UT) TABS 1 cap PO QD     diazepam (VALIUM) 5 MG tablet TAKE ONE TABLET BY MOUTH AT BEDTIME AS NEEDED. 30 tablet 2   fexofenadine (ALLEGRA) 180 MG tablet Take 180 mg by mouth at bedtime.     FLUoxetine (PROZAC) 10 MG capsule TAKE ONE CAPSULE BY MOUTH DAILY 90 capsule 0   furosemide (LASIX) 40 MG tablet TAKE ONE-HALF TO ONE TABLET BY MOUTH DAILY AS NEEDED FOR IN FLUID. 90 tablet 0   glipiZIDE (GLUCOTROL) 5 MG tablet Take 5 mg by mouth daily before breakfast. (Temporary Rx until Rybelsus 7 mg is available)     KERENDIA 10 MG TABS Take 1 tablet by mouth daily.     labetalol (NORMODYNE) 300 MG tablet Take 1 tablet (300 mg total) by mouth 3 (three) times daily.     Liniments (SALONPAS PAIN RELIEF PATCH EX) Apply 1 patch topically daily as needed (back pain).     losartan (COZAAR) 100 MG tablet TAKE ONE TABLET BY MOUTH DAILY 90 tablet 0   ONETOUCH ULTRA test strip CHECK BLOOD SUGAR TWICE DAILY. 100 strip 0   oxyCODONE-acetaminophen (PERCOCET/ROXICET) 5-325 MG tablet Take 1 tablet by mouth 3 (three) times daily as needed.     PREVIDENT 5000 DRY MOUTH 1.1 % GEL dental gel Place onto teeth.     rosuvastatin (CRESTOR) 20 MG tablet TAKE ONE TABLET BY MOUTH DAILY 90 tablet 0   Semaglutide (RYBELSUS) 7 MG TABS Take 7 mg by mouth daily. Via [employer] pt assistance 30 tablet 3   triamcinolone (KENALOG) 0.1 % Apply 1 application topically 2 (two) times daily. 30 g 0   Triamcinolone Acetonide (NASACORT ALLERGY 24HR NA) Place 1 spray into the nose 2 (two) times daily as needed (congestion).     vitamin B-12 (CYANOCOBALAMIN) 500 MCG tablet Take 500 mcg by mouth  daily.     Vitamin  E 180 MG CAPS Take 180 Units by mouth daily.      No current facility-administered medications for this visit.    REVIEW OF SYSTEMS:  $RemoveB'[X]'FMUSllRS$  denotes positive finding, $RemoveBeforeDEI'[ ]'[id number]$  denotes negative finding Cardiac  Comments:  Chest pain or chest pressure:    Shortness of breath upon exertion:    Short of breath when lying flat:    Irregular heart rhythm:        Vascular    Pain in calf, thigh, or hip brought on by ambulation:    Pain in feet at night that wakes you up from your sleep:     Blood clot in your veins:    Leg swelling:           PHYSICAL EXAM: Vitals:   07/10/21 1503  BP: 112/70  Pulse: 84  Temp: 98.1 F (36.7 C)  TempSrc: Temporal  SpO2: 97%  Weight: 155 lb 9.6 oz (70.6 kg)  Height: $Remove'5\' 4"'KZaAzDu$  (1.626 m)    GENERAL: The patient is a well-nourished female, in no acute distress. The vital signs are documented above. CARDIOVASCULAR: Abdomen soft with no abdominal tenderness. PULMONARY: There is good air exchange  MUSCULOSKELETAL: There are no major deformities or cyanosis. NEUROLOGIC: No focal weakness or paresthesias are detected. SKIN: There are no ulcers or rashes noted. PSYCHIATRIC: The patient has a normal affect.  DATA:  Noncontrast CT from 06/27/2021 was reviewed with actual images with the patient and her husband.  Fortunately she has had no change in her maximal diameter of 5.0 cm.  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  She certainly is a very risky candidate for stent graft repair due to her approach vessels with very small iliacs and are extremely calcified.  I also feel that she would be at high risk for worsening her renal insufficiency.  I feel that her at 57-month interval risk for rupture is certainly less  than her risk for surgery.  I did explain the potential 5 %/year estimated risk for rupture of her aneurysm.  I explained symptoms of aneurysm rupture and recommended that she call 911 and present immediately to [facility] should she have symptoms.  We will see her again in 6 months with repeat noncontrast imaging.  I have suggested that we have her see Dr. Carlis Abbott in our [place] office.  Explained that I am no longer doing this type of surgery and would be handled by one of my [place] partners if she did show expansion.  I recommended that she establish with one of our surgeons.  They agree with this recommendation.    Rosetta Posner, MD FACS [facility] [phone]  Note: Portions of this report may have been transcribed using voice recognition software.  Every effort has been made to ensure accuracy; however, inadvertent computerized transcription errors may still be present.

## 2021-07-24 ENCOUNTER — Telehealth: Payer: Self-pay | Admitting: Pharmacist

## 2021-07-24 NOTE — Progress Notes (Signed)
[facility]  to follow up on patient assistance application for Rybelsus. Per representative at [facility] is in the process of being shipped and they will provide tracking number when it becomes available. Please check back in 1 -2 business days.  Orinda Kenner, [facility] Assistant [phone]  Time Spent: [phone]

## 2021-07-25 NOTE — Telephone Encounter (Signed)
Patient reports she has only 1 more day of rybelsus remaining. It will be another 10-14 days before we receive the shipment from [facility] Will provide Rybelsus 3 mg samples and ask patient to take 2 daily.

## 2021-07-25 NOTE — Telephone Encounter (Signed)
Place Rybelsus 3 mg upfront for pick-up. Lot # [id number] Exp 08/2022...Sheila Melton

## 2021-07-26 NOTE — Progress Notes (Signed)
Contacted patient to inform her that Rybelsus samples were at the front desk and she should be getting her shipment in the next week or 2. Patient stated she would pick up samples from office today.   Charlene Brooke, CPP notified  Margaretmary Dys, [facility] Assistant [phone]

## 2021-08-01 ENCOUNTER — Ambulatory Visit (INDEPENDENT_AMBULATORY_CARE_PROVIDER_SITE_OTHER): Payer: PPO | Admitting: Pharmacist

## 2021-08-01 ENCOUNTER — Other Ambulatory Visit: Payer: Self-pay

## 2021-08-01 DIAGNOSIS — E1159 Type 2 diabetes mellitus with other circulatory complications: Secondary | ICD-10-CM

## 2021-08-01 DIAGNOSIS — M4310 Spondylolisthesis, site unspecified: Secondary | ICD-10-CM

## 2021-08-01 DIAGNOSIS — N184 Chronic kidney disease, stage 4 (severe): Secondary | ICD-10-CM

## 2021-08-01 DIAGNOSIS — I700 Atherosclerosis of aorta: Secondary | ICD-10-CM

## 2021-08-01 DIAGNOSIS — E782 Mixed hyperlipidemia: Secondary | ICD-10-CM

## 2021-08-01 NOTE — Progress Notes (Signed)
Chronic Care Management Pharmacy Note  08/01/2021 Name:  Sheila Melton MRN:  [id number] DOB:  1941/03/17  Summary: -Home BG 30-day avg 170 - on Rybelsus 3 mg x 2 daily, glipizide 10 mg daily. Awaiting shipment of Rybelsus 7 mg (should arrive within a week)  Recommendations/Changes made from today's visit: -No med changes. Await Rybelsus 7 mg shipment from [employer] -F/U call 1 month   Subjective: Sheila Melton is an 80 y.o. year old female who is a primary patient of Burns, Claudina Lick, MD.  The CCM team was consulted for assistance with disease management and care coordination needs.    Engaged with patient by telephone for follow up visit in response to provider referral for pharmacy case management and/or care coordination services.   Consent to Services:  The patient was given information about Chronic Care Management services, agreed to services, and gave verbal consent prior to initiation of services.  Please see initial visit note for detailed documentation.   Patient Care Team: Binnie Rail, MD as PCP - General (Internal Medicine) Wellington Hampshire, MD as PCP - Cardiology (Cardiology) Elmarie Shiley, MD as Consulting Physician (Nephrology) Charlton Haws, [facility] as Pharmacist (Pharmacist)  Recent office visits: 05/21/21 Dr Quay Burow OV: chronic f/u; A1c 7.5, GFR 17. Stop glipizide, Januvia. Start Rybelsus 3 mg.   Recent consult visits: 07/10/21 Dr Early (vasc surg): f/u AAA; f/u 6 months for repeat imaging. Advised seeing Dr Carlis Abbott in [facility] office.  03/12/21 Dr Posey Pronto (nephrology): started Kerendia 10 mg daily  Hospital visits: None in previous 6 months  Objective:  Lab Results  Component Value Date   CREATININE 2.52 (H) 05/21/2021   BUN 41 (H) 05/21/2021   GFR 17.81 (L) 05/21/2021   GFRNONAA 46 (L) 02/19/2017   GFRAA 53 (L) 02/19/2017   NA 136 05/21/2021   K 4.1 05/21/2021   CALCIUM 9.5 05/21/2021   CO2 23 05/21/2021   GLUCOSE 178 (H) 05/21/2021    Lab Results   Component Value Date/Time   HGBA1C 7.5 (H) 05/21/2021 02:24 PM   HGBA1C 7.8 (H) 11/21/2020 03:13 PM   FRUCTOSAMINE 332 (H) 04/25/2015 12:52 PM   GFR 17.81 (L) 05/21/2021 02:24 PM   GFR 19.24 (L) 11/21/2020 03:13 PM   MICROALBUR 194.0 Verified by manual dilution. (H) 11/16/2014 12:05 PM   MICROALBUR 95.5 (H) 05/09/2014 11:28 AM    Last diabetic Eye exam:  Lab Results  Component Value Date/Time   HMDIABEYEEXA No Retinopathy 10/29/2020 12:00 AM    Last diabetic Foot exam: No results found for: HMDIABFOOTEX   Lab Results  Component Value Date   CHOL 190 05/21/2021   HDL 36.40 (L) 05/21/2021   LDLCALC 70 05/21/2020   LDLDIRECT 62.0 05/21/2021   TRIG (H) 05/21/2021    463.0 Triglyceride is over 400; calculations on Lipids are invalid.   CHOLHDL 5 05/21/2021    Hepatic Function Latest Ref Rng & Units 05/21/2021 11/21/2020 09/07/2020  Total Protein 6.0 - 8.3 g/dL 7.0 7.3 6.8  Albumin 3.5 - 5.2 g/dL 4.1 4.2 -  AST 0 - 37 U/L $Remo'17 16 16  'HTrKP$ ALT 0 - 35 U/L $Remo'12 12 11  'UGDNm$ Alk Phosphatase 39 - 117 U/L 71 100 -  Total Bilirubin 0.2 - 1.2 mg/dL 0.3 0.3 0.2  Bilirubin, Direct 0.0 - 0.3 mg/dL - - -    Lab Results  Component Value Date/Time   TSH 3.01 11/21/2020 03:13 PM   TSH 2.95 11/17/2018 03:03 PM   FREET4 0.78  11/27/2011 01:53 PM   FREET4 0.8 12/31/2007 08:13 AM    CBC Latest Ref Rng & Units 11/21/2020 05/21/2020 11/17/2018  WBC 4.0 - 10.5 K/uL 12.6(H) 11.2(H) 11.3(H)  Hemoglobin 12.0 - 15.0 g/dL 11.6(L) 10.8(L) 11.7(L)  Hematocrit 36.0 - 46.0 % 34.7(L) 32.6(L) 35.4(L)  Platelets 150.0 - 400.0 K/uL 350.0 318 349.0    Lab Results  Component Value Date/Time   VD25OH 32 09/07/2020 04:19 PM   VD25OH 87 10/11/2010 08:57 PM    Clinical ASCVD: Yes  The 10-year ASCVD risk score (Arnett DK, et al., 2019) is: 47.2%   Values used to calculate the score:     Age: 55 years     Sex: Female     Is Non-Hispanic African American: No     Diabetic: Yes     Tobacco smoker: Yes     Systolic Blood  Pressure: 112 mmHg     Is BP treated: Yes     HDL Cholesterol: 36.4 mg/dL     Total Cholesterol: 190 mg/dL    Depression screen [facility] 2/9 07/10/2021 09/14/2020 09/07/2020  Decreased Interest 1 0 0  Down, Depressed, Hopeless 1 0 0  PHQ - 2 Score 2 0 0  Altered sleeping 0 - -  Tired, decreased energy 1 - -  Change in appetite 0 - -  Feeling bad or failure about yourself  0 - -  Trouble concentrating 1 - -  Moving slowly or fidgety/restless 0 - -  Suicidal thoughts 0 - -  PHQ-9 Score 4 - -  Difficult doing work/chores Not difficult at all - -  Some recent data might be hidden     Social History   Tobacco Use  Smoking Status Light Smoker   Packs/day: 1.00   Years: 50.00   Pack years: 50.00   Types: E-cigarettes, Cigarettes  Smokeless Tobacco Never  Tobacco Comments   e-cigs   BP Readings from Last 3 Encounters:  07/10/21 112/70  05/21/21 110/68  12/31/20 116/70   Pulse Readings from Last 3 Encounters:  07/10/21 84  05/21/21 78  12/31/20 80   Wt Readings from Last 3 Encounters:  07/10/21 155 lb 9.6 oz (70.6 kg)  05/21/21 158 lb (71.7 kg)  12/31/20 158 lb (71.7 kg)   BMI Readings from Last 3 Encounters:  07/10/21 26.71 kg/m  05/21/21 27.12 kg/m  12/31/20 27.12 kg/m    Assessment/Interventions: Review of patient past medical history, allergies, medications, health status, including review of consultants reports, laboratory and other test data, was performed as part of comprehensive evaluation and provision of chronic care management services.   SDOH:  (Social Determinants of Health) assessments and interventions performed: Yes  SDOH Screenings   Alcohol Screen: Low Risk    Last Alcohol Screening Score (AUDIT): 0  Depression (PHQ2-9): Low Risk    PHQ-2 Score: 4  Financial Resource Strain: Low Risk    Difficulty of Paying Living Expenses: Not hard at all  Food Insecurity: No Food Insecurity   Worried About Charity fundraiser in the Last Year: Never true    Ran Out of Food in the Last Year: Never true  Housing: Low Risk    Last Housing Risk Score: 0  Physical Activity: Inactive   Days of Exercise per Week: 0 days   Minutes of Exercise per Session: 0 min  Social Connections: Socially Integrated   Frequency of Communication with Friends and Family: More than three times a week   Frequency of Social Gatherings with Friends  and Family: Once a week   Attends Religious Services: More than 4 times per year   Active Member of Clubs or Organizations: No   Attends Music therapist: More than 4 times per year   Marital Status: Married  Stress: No Stress Concern Present   Feeling of Stress : Not at all  Tobacco Use: High Risk   Smoking Tobacco Use: Light Smoker   Smokeless Tobacco Use: Never  Transportation Needs: No Transportation Needs   Lack of Transportation (Medical): No   Lack of Transportation (Non-Medical): No    CCM Care Plan  Allergies  Allergen Reactions   Rocephin [Ceftriaxone Sodium In Dextrose] Dermatitis and Rash    10/24 /14 blisters of palms & diffuse rash Because of a history of documented adverse serious drug reaction;Medi Alert bracelet  is recommended   Amoxicillin Rash    Has patient had a PCN reaction causing IMMEDIATE RASH, FACIAL/TONGUE/THROAT SWELLING, SOB, OR LIGHTHEADEDNESS WITH HYPOTENSION:  #  #  #  YES  #  #  #  Has patient had a PCN reaction causing severe rash involving mucus membranes or skin necrosis: No Has patient had a PCN reaction that required hospitalization No Has patient had a PCN reaction occurring within the last 10 years: No If all of the above answers are "NO", then may proceed with Cephalosporin use.    Captopril Cough   Simvastatin Other (See Comments)    MYALGIAS BUTTOCKS CRAMP   Ciprofloxacin Other (See Comments)    Sores in mouth    Adhesive [Tape] Rash   Latex Rash    Patient states she gets red and a rash when latex touches her.    Medications Reviewed Today      Reviewed by Charlton Haws, [facility] (Pharmacist) on 08/01/21 at [place] List Status: <None>   Medication Order Taking? Sig Documenting Provider Last Dose Status Informant  amLODipine (NORVASC) 5 MG tablet [id number] Yes TAKE ONE TABLET BY MOUTH TWICE DAILY Burns, Claudina Lick, MD Taking Active   Ascorbic Acid (VITAMIN C) 250 MG CHEW [id number] Yes Chew 1,000 mg by mouth daily.  [provider] Taking Active Self  ascorbic Acid (VITAMIN C) 500 MG CPCR [id number] Yes 1 cap PO QD [provider] Taking Active   B Complex-C (SUPER B COMPLEX PO) [id number] Yes Take 1 tablet by mouth daily.  [provider] Taking Active Self  Blood Glucose Monitoring Suppl (ONE TOUCH ULTRA 2) w/Device KIT [id number] Yes Use as advised Philemon Kingdom, MD Taking Active Self  Cholecalciferol (VITAMIN D3) 2000 UNITS TABS [id number] Yes Take 2,000 Units by mouth daily.  [provider] Taking Active Self  Cholecalciferol 125 MCG (5000 UT) TABS [id number] Yes 1 cap PO QD [provider] Taking Active   diazepam (VALIUM) 5 MG tablet [id number] Yes TAKE ONE TABLET BY MOUTH AT BEDTIME AS NEEDED. Binnie Rail, MD Taking Active   fexofenadine [facility]) 180 MG tablet [id number] Yes Take 180 mg by mouth at bedtime. [provider] Taking Active Self  FLUoxetine (PROZAC) 10 MG capsule [id number] Yes TAKE ONE CAPSULE BY MOUTH DAILY Burns, Claudina Lick, MD Taking Active   furosemide (LASIX) 40 MG tablet [id number] Yes TAKE ONE-HALF TO ONE TABLET BY MOUTH DAILY AS NEEDED FOR IN FLUID. Binnie Rail, MD Taking Active   glipiZIDE (GLUCOTROL) 5 MG tablet [id number] Yes Take 10 mg by mouth daily with supper. (Temporary Rx until Rybelsus 7 mg is available) [provider] Taking Active  KERENDIA 10 MG TABS [id number] Yes Take 1 tablet by mouth daily. [provider] Taking Active   labetalol (NORMODYNE) 300 MG tablet [id number] Yes Take 1 tablet (300 mg total) by mouth 3 (three) times  daily. Binnie Rail, MD Taking Active   Liniments [facility] PAIN RELIEF PATCH EX) [id number] Yes Apply 1 patch topically daily as needed (back pain). [provider] Taking Active Self  losartan (COZAAR) 100 MG tablet [id number] Yes TAKE ONE TABLET BY MOUTH DAILY Quay Burow, Claudina Lick, MD Taking Active   [facility] ULTRA test strip [id number] Yes CHECK BLOOD SUGAR TWICE DAILY. Binnie Rail, MD Taking Active   oxyCODONE-acetaminophen (PERCOCET/ROXICET) 5-325 MG tablet [id number] Yes Take 1 tablet by mouth 3 (three) times daily as needed. [provider] Taking Active   PREVIDENT 5000 DRY MOUTH 1.1 % GEL dental gel [id number] Yes Place onto teeth. [provider] Taking Active   rosuvastatin (CRESTOR) 20 MG tablet [id number] Yes TAKE ONE TABLET BY MOUTH DAILY Burns, Claudina Lick, MD Taking Active   Semaglutide [facility]) 7 MG TABS [id number] Yes Take 7 mg by mouth daily. Via [employer] pt assistance Binnie Rail, MD Taking Active   Triamcinolone Acetonide (NASACORT ALLERGY 24HR NA) [id number] Yes Place 1 spray into the nose 2 (two) times daily as needed (congestion). [provider] Taking Active Self  vitamin B-12 (CYANOCOBALAMIN) 500 MCG tablet [id number] Yes Take 500 mcg by mouth daily. [provider] Taking Active Self  Vitamin E 180 MG CAPS [id number] Yes Take 180 Units by mouth daily.  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   CKD (chronic kidney disease) 05/21/2016   Sleep difficulties 01/18/2016   Diabetes mellitus ([place]) 10/23/2015   Numbness of toes-Right foot 06/26/2015   Colitis 03/19/2015    Nephrolithiasis 03/19/2015   AAA (abdominal aortic aneurysm) without rupture ([place]) 03/19/2015   Anxiety state 03/19/2015   Skin cancer 01/09/2015   Former smoker 05/24/2014   Bilateral renal cysts 09/16/2012   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[place]) 07/26/2012   Degenerative spondylolisthesis 07/26/2012   SHOULDER PAIN, LEFT, CHRONIC 07/25/2009   CARDIAC MURMUR, SYSTOLIC 09/25/5207   Mixed hyperlipidemia 12/30/2007   Tobacco use disorder 12/30/2007   Essential hypertension 12/30/2007   Subclavian steal syndrome 12/30/2007   CAROTID BRUIT 04/07/2007    Immunization History  Administered Date(s) Administered   Fluad Quad(high Dose 65+) 08/24/2019, 08/21/2020   Influenza Split 08/28/2011, 09/07/2012   Influenza Whole 09/06/2008, 08/30/2009, 10/11/2010   Influenza, High Dose Seasonal PF 08/24/2013, 08/18/2014, 08/07/2015, 08/08/2016, 07/31/2017, 08/10/2018   PFIZER(Purple Top)SARS-COV-2 Vaccination 11/29/2019, 12/19/2019, 11/16/2020   Pneumococcal Conjugate-13 09/26/2015   Pneumococcal Polysaccharide-23 12/10/2009    Conditions to be addressed/monitored:  Hypertension, Hyperlipidemia, Diabetes, Coronary Artery Disease, and Chronic Kidney Disease  Care Plan : [place]  Updates made by Charlton Haws, [place] since 08/01/2021 12:00 AM     Problem: Hypertension, Hyperlipidemia, Diabetes, Coronary Artery Disease, and Chronic Kidney Disease   Priority: High     Long-Range Goal: Disease management   Start Date: 06/25/2021  Expected End Date: 12/26/2021  This Visit's Progress:  On track  Recent Progress: On track  Priority: High  Note:   Current Barriers:  Unable to independently monitor therapeutic efficacy Unable to maintain control of diabetes  Pharmacist Clinical Goal(s):  Patient will achieve adherence to monitoring guidelines and medication adherence to achieve therapeutic efficacy adhere to plan to optimize therapeutic regimen for Diabetes  as evidenced by report of adherence to recommended medication management changes through collaboration with PharmD and provider.   Interventions: 1:1 collaboration with Binnie Rail, MD regarding development and update of comprehensive plan of care as evidenced by provider attestation and co-signature Inter-disciplinary care team collaboration (see longitudinal plan of care) Comprehensive medication review performed; medication list updated in electronic medical record  Hyperlipidemia: (LDL goal < 70) -Not ideally controlled - LDL is at goal, triglycerides are elevated; pt has been stable on rosuvastatin 20 mg for ~4 years, GFR has steadily declined over that time (from ~50 to 17 ml/min) -Current treatment: Rosuvastatin 20 mg daily -Pt may benefit from changing rosuvastatin; atorvastatin is preferred high-intensity statin with GFR < 30 -Consider changing rosuvastatin to atorvastatin at follow up  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%) -Not ideally controlled - home BG are not ideal, still > 200 often; per [employer] Rybelsus 7 mg has been shipped and should arrive at the office within a week; she is taking glipizide 10 mg/day temporarily while we awaiting Rybelsus shipment -Current home glucose readings: 30 day avg 170 -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 When Rybelsus 7 mg arrives, likely will be able to stop glipizide  Back pain (Goal: manage pain) -Not ideally controlled - pt reports she is in significant pain very day, which prevents her from  being [place]; she sees pain mgmt regularly -Pt has spine stimulator implanted - pt reports it has not helped much -Current treatment  Oxycodone-APAP 5-325 mg #60 per month -Counseled that pain and stress can increase BG; advised to take oxycodone as directed -Recommended to continue current medication  Patient Goals/Self-Care Activities Patient will:  - take medications as prescribed -focus on medication adherence by pill box -check glucose AM and bedtime -collaborate with provider on medication access solutions (Rybelsus)        Medication Assistance:  Rybelsus - applied for [employer] PAP, expected approval 07/25/21 [place] Clamp - applied for [employer] PAP, expected approval 07/25/21  Compliance/Adherence/Medication fill history: Care Gaps: Hep C screening  Star-Rating Drugs: Rybelsus - LF 05/25/21 x 30 ds Rosuvastatin - LF 03/29/21 x 90 ds Losartan - LF 03/29/21 x 90 ds  Patient's preferred pharmacy is:  [id number], [place]. [place] [address] Phone: [phone] Fax: [place], [place] [address] Phone: [phone] Fax: [phone]  Uses pill box? Yes Pt endorses 100% compliance  We discussed: Current pharmacy is preferred with insurance plan and patient is satisfied with pharmacy services Patient decided to: Continue current medication management strategy  Care Plan and Follow  Up Patient Decision:  Patient agrees to Care Plan and Follow-up.  Plan: Telephone follow up appointment with care management team member scheduled for:  1 month  Charlene Brooke, PharmD, [place], CPP Clinical Pharmacist [facility] [phone]

## 2021-08-01 NOTE — Patient Instructions (Signed)
Visit Information  Phone number for Pharmacist: [phone]   Goals Addressed             This Visit's Progress    Monitor and Manage My Blood Sugar-Diabetes Type 2       Timeframe:  Long-Range Goal Priority:  High Start Date:      06/25/21                       Expected End Date:      06/25/22              Follow Up Date Oct 2022   - check blood sugar at prescribed times - check blood sugar if I feel it is too high or too low - take the blood sugar meter to all doctor visits   Why is this important?   Checking your blood sugar at home helps to keep it from getting very high or very low.  Writing the results in a diary or log helps the doctor know how to care for you.  Your blood sugar log should have the time, date and the results.  Also, write down the amount of insulin or other medicine that you take.  Other information, like what you ate, exercise done and how you were feeling, will also be helpful.     Notes:         Care Plan : CCM Pharmacy Care Plan  Updates made by Charlton Haws, RPH since 08/01/2021 12:00 AM     Problem: Hypertension, Hyperlipidemia, Diabetes, Coronary Artery Disease, and Chronic Kidney Disease   Priority: High     Long-Range Goal: Disease management   Start Date: 06/25/2021  Expected End Date: 12/26/2021  This Visit's Progress: On track  Recent Progress: On track  Priority: High  Note:   Current Barriers:  Unable to independently monitor therapeutic efficacy Unable to maintain control of diabetes  Pharmacist Clinical Goal(s):  Patient will achieve adherence to monitoring guidelines and medication adherence to achieve therapeutic efficacy adhere to plan to optimize therapeutic regimen for Diabetes as evidenced by report of adherence to recommended medication management changes through collaboration with PharmD and provider.   Interventions: 1:1 collaboration with Binnie Rail, MD regarding development and update of  comprehensive plan of care as evidenced by provider attestation and co-signature Inter-disciplinary care team collaboration (see longitudinal plan of care) Comprehensive medication review performed; medication list updated in electronic medical record  Hyperlipidemia: (LDL goal < 70) -Not ideally controlled - LDL is at goal, triglycerides are elevated; pt has been stable on rosuvastatin 20 mg for ~4 years, GFR has steadily declined over that time (from ~50 to 17 ml/min) -Current treatment: Rosuvastatin 20 mg daily -Pt may benefit from changing rosuvastatin; atorvastatin is preferred high-intensity statin with GFR < 30 -Consider changing rosuvastatin to atorvastatin at follow up  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%) -Not ideally controlled - home BG are not ideal, still > 200 often; per [employer] Rybelsus 7 mg has been shipped and should arrive at the office within a week; she is taking glipizide 10 mg/day temporarily while we awaiting Rybelsus shipment -Current home glucose readings: 30 day avg 170 -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 When Rybelsus 7 mg arrives, likely will be able to stop glipizide  Back pain (Goal: manage pain) -Not ideally controlled - pt reports she is in significant pain very day, which prevents her from being [place]; she sees pain mgmt regularly -Pt has spine stimulator implanted - pt reports it has not helped much -Current treatment  Oxycodone-APAP 5-325 mg #60 per month -Counseled that pain and stress can increase BG;  advised to take oxycodone as directed -Recommended to continue current medication  Patient Goals/Self-Care Activities Patient will:  - take medications as prescribed -focus on medication adherence by pill box -check glucose AM and bedtime -collaborate with provider on medication access solutions (Rybelsus)      Patient verbalizes understanding of instructions provided today and agrees to view in [place].  Telephone follow up appointment with pharmacy team member scheduled for: 1 month  Charlene Brooke, PharmD, [place], CPP Clinical Pharmacist [facility] at [facility] [phone]

## 2021-08-02 ENCOUNTER — Telehealth: Payer: Self-pay

## 2021-08-02 NOTE — Telephone Encounter (Signed)
Called and spoke with patient today.  Samples left up front for patient pick up.

## 2021-08-09 DIAGNOSIS — E1159 Type 2 diabetes mellitus with other circulatory complications: Secondary | ICD-10-CM

## 2021-08-09 DIAGNOSIS — N184 Chronic kidney disease, stage 4 (severe): Secondary | ICD-10-CM | POA: Diagnosis not present

## 2021-08-09 DIAGNOSIS — E782 Mixed hyperlipidemia: Secondary | ICD-10-CM | POA: Diagnosis not present

## 2021-08-19 DIAGNOSIS — N184 Chronic kidney disease, stage 4 (severe): Secondary | ICD-10-CM | POA: Diagnosis not present

## 2021-08-20 ENCOUNTER — Encounter: Payer: Self-pay | Admitting: Internal Medicine

## 2021-08-20 NOTE — Progress Notes (Signed)
Subjective:    Patient ID: Sheila Melton, female    DOB: 02/05/1941, 80 y.o.   MRN: [id number]  This visit occurred during the SARS-CoV-2 public health emergency.  Safety protocols were in place, including screening questions prior to the visit, additional usage of staff PPE, and extensive cleaning of exam room while observing appropriate contact time as indicated for disinfecting solutions.     HPI The patient is here for follow up of their chronic medical problems, including DM, htn, hld, CKD, anxiety, depression, chronic back pain, sleep difficulties   She is going to see dr patel next week.   She occasional has lightheadedness when she stands up - this started after taking the [place].    Sugars at home - sugars usually 170 in am and over 200 in evening  Medications and allergies reviewed with patient and updated if appropriate.  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   CKD (chronic kidney disease) 05/21/2016   Sleep difficulties 01/18/2016   Diabetes mellitus ([place]) 10/23/2015   Numbness of toes-Right foot 06/26/2015   Colitis 03/19/2015   Nephrolithiasis 03/19/2015   AAA (abdominal aortic aneurysm) without rupture ([place]) 03/19/2015   Anxiety state 03/19/2015   Skin cancer 01/09/2015   Former smoker 05/24/2014   Bilateral renal cysts 09/16/2012   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[place]) 07/26/2012   Degenerative spondylolisthesis 07/26/2012   SHOULDER PAIN, LEFT, CHRONIC 07/25/2009   CARDIAC MURMUR, SYSTOLIC 11/18/3233   Mixed hyperlipidemia 12/30/2007   Tobacco use disorder 12/30/2007   Essential hypertension 12/30/2007   Subclavian steal  syndrome 12/30/2007   CAROTID BRUIT 04/07/2007    Current Outpatient Medications on File Prior to Visit  Medication Sig Dispense Refill   amLODipine (NORVASC) 5 MG tablet TAKE ONE TABLET BY MOUTH TWICE DAILY 180 tablet 0   Ascorbic Acid (VITAMIN C) 250 MG CHEW Chew 1,000 mg by mouth daily.      ascorbic Acid (VITAMIN C) 500 MG CPCR 1 cap PO QD     aspirin 81 MG chewable tablet 1 tablet     augmented betamethasone dipropionate (DIPROLENE-AF) 0.05 % cream APPLY 1 application Externally twice a day FOR 14 days     B Complex-C (SUPER B COMPLEX PO) Take 1 tablet by mouth daily.      Blood Glucose Monitoring Suppl (ONE TOUCH ULTRA 2) w/Device KIT Use as advised 1 each 0   Cholecalciferol (VITAMIN D3) 2000 UNITS TABS Take 2,000 Units by mouth daily.      Cholecalciferol 125 MCG (5000 UT) TABS 1 cap PO QD     diazepam (VALIUM) 5 MG tablet TAKE ONE TABLET BY MOUTH AT BEDTIME AS NEEDED. 30 tablet 2   fexofenadine (ALLEGRA) 180 MG tablet Take 180 mg by mouth at bedtime.     FLUoxetine (PROZAC) 10 MG capsule TAKE ONE CAPSULE BY MOUTH DAILY 90 capsule 0   furosemide (LASIX) 40 MG tablet TAKE ONE-HALF TO ONE TABLET BY MOUTH DAILY AS NEEDED FOR IN FLUID. 90 tablet 0   KERENDIA 10 MG TABS Take 1 tablet by mouth daily.     labetalol (NORMODYNE) 300 MG tablet Take 1 tablet (300 mg total) by mouth 3 (three)  times daily.     Liniments (SALONPAS PAIN RELIEF PATCH EX) Apply 1 patch topically daily as needed (back pain).     losartan (COZAAR) 100 MG tablet TAKE ONE TABLET BY MOUTH DAILY 90 tablet 0   meloxicam (MOBIC) 7.5 MG tablet 1 tablet     ONETOUCH ULTRA test strip CHECK BLOOD SUGAR TWICE DAILY. 100 strip 0   oxyCODONE-acetaminophen (PERCOCET/ROXICET) 5-325 MG tablet Take 1 tablet by mouth 3 (three) times daily as needed.     PREVIDENT 5000 DRY MOUTH 1.1 % GEL dental gel Place onto teeth.     rosuvastatin (CRESTOR) 20 MG tablet TAKE ONE TABLET BY MOUTH DAILY 90 tablet 0   Semaglutide (RYBELSUS) 7 MG TABS  Take 7 mg by mouth daily. Via [employer] pt assistance 30 tablet 3   SURE COMFORT PEN NEEDLES 32G X 4 MM MISC      traMADol (ULTRAM) 50 MG tablet 1 tablet as needed     Triamcinolone Acetonide (NASACORT ALLERGY 24HR NA) Place 1 spray into the nose 2 (two) times daily as needed (congestion).     vitamin B-12 (CYANOCOBALAMIN) 500 MCG tablet Take 500 mcg by mouth daily.     Vitamin E 180 MG CAPS Take 180 Units by mouth daily.      glipiZIDE (GLUCOTROL) 5 MG tablet Take 10 mg by mouth daily with supper. (Temporary Rx until Rybelsus 7 mg is available) (Patient not taking: Reported on 08/21/2021)     No current facility-administered medications on file prior to visit.    Past Medical History:  Diagnosis Date   AAA (abdominal aortic aneurysm)    BEING WATCHED   VVS. 4.2 cm infrarenal abdominal aortic aneurysm without rupture noted 11/08/2016   Arthritis    Cancer ([place])    skin cancer removed from left shoulder  2017   CAP (community acquired pneumonia) 09/02/13-11/05/13    with SIRS   Depression    ?? from prednisone    not on pred now   Diabetes mellitus    type ii    long time ago.   Family history of anesthesia complication    PATIENTS MOM HAD TROUBLE WAKING UP    GERD (gastroesophageal reflux disease)    will take occasional tums   Heart murmur    still has.   Sees Dr. Stanford Breed   History of kidney stones    has them at the present time   Hyperlipidemia    Hypertension    unspecified essential   Renal insufficiency    "FSGS"   Subclavian steal syndrome    LEFT SIDE   NO SIDE EFFECTS   Subclavian steal syndrome --  left    85% blocked   UTI (lower urinary tract infection) 08/26/13   Enterococcus and Escherichia coli both sensitive to nitrofurantoin    Past Surgical History:  Procedure Laterality Date   ABDOMINAL HYSTERECTOMY     BACK SURGERY     CATARACT EXTRACTION     OS   CYST EXCISION Right 05-23-13   Right index finger: cyst   DILATION AND CURETTAGE OF UTERUS      EYE SURGERY     g1 p1     SPINE SURGERY  Sept. 2013   VIDEO ASSISTED THORACOSCOPY (VATS)/ LOBECTOMY Right 02/16/2017   Procedure: Right VIDEO ASSISTED THORACOSCOPY (VATS)/ Right Middle LOBECTOMY;  Surgeon: [name], MD;  Location: [facility];  Service: Thoracic;  Laterality: Right;    Social History   Socioeconomic History  Marital status: Married    Spouse name: Not on file   Number of children: 1   Years of education: Not on file   Highest education level: Not on file  Occupational History   Not on file  Tobacco Use   Smoking status: Light Smoker    Packs/day: 1.00    Years: 50.00    Pack years: 50.00    Types: E-cigarettes, Cigarettes   Smokeless tobacco: Never   Tobacco comments:    e-cigs  Vaping Use   Vaping Use: Never used  Substance and Sexual Activity   Alcohol use: No   Drug use: No   Sexual activity: Not on file  Other Topics Concern   Not on file  Social History Narrative   Has 3 caffeinated bev a day      Exercise: none   Social Determinants of Health   Financial Resource Strain: Low Risk    Difficulty of Paying Living Expenses: Not hard at all  Food Insecurity: No Food Insecurity   Worried About Charity fundraiser in the Last Year: Never true   Ran Out of Food in the Last Year: Never true  Transportation Needs: No Transportation Needs   Lack of Transportation (Medical): No   Lack of Transportation (Non-Medical): No  Physical Activity: Inactive   Days of Exercise per Week: 0 days   Minutes of Exercise per Session: 0 min  Stress: No Stress Concern Present   Feeling of Stress : Not at all  Social Connections: Socially Integrated   Frequency of Communication with Friends and Family: More than three times a week   Frequency of Social Gatherings with Friends and Family: Once a week   Attends Religious Services: More than 4 times per year   Active Member of [employer] or Organizations: No   Attends Music therapist: More than 4 times  per year   Marital Status: Married    Family History  Problem Relation Age of Onset   Heart failure Mother    Heart disease Mother    Hypertension Mother    Other Mother        varicose veins   Deep vein thrombosis Mother    Varicose Veins Mother    Heart attack Mother    Hypertension Sister        X 2   Diabetes Sister    Hyperlipidemia Sister    Other Sister        varicose veins   Heart disease Father    Diabetes Father    Hyperlipidemia Father    Hypertension Father    Heart attack Father    Heart disease Brother        MI in late 74s   Hyperlipidemia Brother    Heart attack Brother    Coronary artery disease Other    Diabetes Other    Parkinsonism Brother    Heart failure Brother    Hypertension Daughter     Review of Systems  Constitutional:  Negative for fever.  Respiratory:  Negative for cough, shortness of breath and wheezing.   Cardiovascular:  Negative for chest pain, palpitations and leg swelling.  Genitourinary:  Positive for decreased urine volume (less after starting rybelsus) and difficulty urinating (slight since starting rybelsus).  Neurological:  Positive for light-headedness (upon standing). Negative for headaches.      Objective:   Vitals:   08/21/21 1338  BP: 108/72  Pulse: 95  Temp: 98.3 F (36.8 C)  SpO2:  97%   BP Readings from Last 3 Encounters:  08/21/21 108/72  07/10/21 112/70  05/21/21 110/68   Wt Readings from Last 3 Encounters:  08/21/21 158 lb (71.7 kg)  07/10/21 155 lb 9.6 oz (70.6 kg)  05/21/21 158 lb (71.7 kg)   Body mass index is 27.12 kg/m.   Physical Exam    Constitutional: Appears well-developed and well-nourished. No distress.  HENT:  Head: Normocephalic and atraumatic.  Neck: Neck supple. No tracheal deviation present. No thyromegaly present.  No cervical lymphadenopathy Cardiovascular: Normal rate, regular rhythm and normal heart sounds.   No murmur heard. No carotid bruit .  No edema Pulmonary/Chest:  Effort normal and breath sounds normal. No respiratory distress. No has no wheezes. No rales.  Skin: Skin is warm and dry. Not diaphoretic.  Psychiatric: Normal mood and affect. Behavior is normal.      Assessment & Plan:    See Problem List for Assessment and Plan of chronic medical problems.

## 2021-08-20 NOTE — Patient Instructions (Addendum)
  Flu immunization administered today.     Blood work was ordered.     Medications changes include :   decrease amlodipine to 5 mg in the morning and 1/2 tab in the evening.  I will talk to Mendel Ryder about increasing your rybelsus to 14 mg    Please followup in 6 months

## 2021-08-21 ENCOUNTER — Ambulatory Visit (INDEPENDENT_AMBULATORY_CARE_PROVIDER_SITE_OTHER): Payer: PPO | Admitting: Internal Medicine

## 2021-08-21 ENCOUNTER — Other Ambulatory Visit: Payer: Self-pay

## 2021-08-21 VITALS — BP 108/72 | HR 95 | Temp 98.3°F | Ht 64.0 in | Wt 158.0 lb

## 2021-08-21 DIAGNOSIS — F411 Generalized anxiety disorder: Secondary | ICD-10-CM | POA: Diagnosis not present

## 2021-08-21 DIAGNOSIS — G479 Sleep disorder, unspecified: Secondary | ICD-10-CM

## 2021-08-21 DIAGNOSIS — N184 Chronic kidney disease, stage 4 (severe): Secondary | ICD-10-CM | POA: Diagnosis not present

## 2021-08-21 DIAGNOSIS — E782 Mixed hyperlipidemia: Secondary | ICD-10-CM

## 2021-08-21 DIAGNOSIS — Z23 Encounter for immunization: Secondary | ICD-10-CM | POA: Diagnosis not present

## 2021-08-21 DIAGNOSIS — I10 Essential (primary) hypertension: Secondary | ICD-10-CM | POA: Diagnosis not present

## 2021-08-21 DIAGNOSIS — F3289 Other specified depressive episodes: Secondary | ICD-10-CM

## 2021-08-21 DIAGNOSIS — E1159 Type 2 diabetes mellitus with other circulatory complications: Secondary | ICD-10-CM

## 2021-08-21 DIAGNOSIS — M810 Age-related osteoporosis without current pathological fracture: Secondary | ICD-10-CM

## 2021-08-21 LAB — CBC WITH DIFFERENTIAL/PLATELET
Basophils Absolute: 0.1 10*3/uL (ref 0.0–0.1)
Basophils Relative: 0.6 % (ref 0.0–3.0)
Eosinophils Absolute: 0.5 10*3/uL (ref 0.0–0.7)
Eosinophils Relative: 4.3 % (ref 0.0–5.0)
HCT: 32.5 % — ABNORMAL LOW (ref 36.0–46.0)
Hemoglobin: 10.7 g/dL — ABNORMAL LOW (ref 12.0–15.0)
Lymphocytes Relative: 26.6 % (ref 12.0–46.0)
Lymphs Abs: 3 10*3/uL (ref 0.7–4.0)
MCHC: 33 g/dL (ref 30.0–36.0)
MCV: 89.8 fl (ref 78.0–100.0)
Monocytes Absolute: 1.1 10*3/uL — ABNORMAL HIGH (ref 0.1–1.0)
Monocytes Relative: 10.1 % (ref 3.0–12.0)
Neutro Abs: 6.6 10*3/uL (ref 1.4–7.7)
Neutrophils Relative %: 58.4 % (ref 43.0–77.0)
Platelets: 328 10*3/uL (ref 150.0–400.0)
RBC: 3.62 Mil/uL — ABNORMAL LOW (ref 3.87–5.11)
RDW: 13.5 % (ref 11.5–15.5)
WBC: 11.3 10*3/uL — ABNORMAL HIGH (ref 4.0–10.5)

## 2021-08-21 LAB — LDL CHOLESTEROL, DIRECT: Direct LDL: 59 mg/dL

## 2021-08-21 LAB — COMPREHENSIVE METABOLIC PANEL
ALT: 13 U/L (ref 0–35)
AST: 18 U/L (ref 0–37)
Albumin: 4.1 g/dL (ref 3.5–5.2)
Alkaline Phosphatase: 75 U/L (ref 39–117)
BUN: 38 mg/dL — ABNORMAL HIGH (ref 6–23)
CO2: 25 mEq/L (ref 19–32)
Calcium: 9.3 mg/dL (ref 8.4–10.5)
Chloride: 105 mEq/L (ref 96–112)
Creatinine, Ser: 2.59 mg/dL — ABNORMAL HIGH (ref 0.40–1.20)
GFR: 17.21 mL/min — ABNORMAL LOW (ref >60.00)
Glucose, Bld: 177 mg/dL — ABNORMAL HIGH (ref 70–99)
Potassium: 3.9 mEq/L (ref 3.5–5.1)
Sodium: 139 mEq/L (ref 135–145)
Total Bilirubin: 0.3 mg/dL (ref 0.2–1.2)
Total Protein: 7 g/dL (ref 6.0–8.3)

## 2021-08-21 LAB — LIPID PANEL
Cholesterol: 153 mg/dL (ref 0–200)
HDL: 40.4 mg/dL (ref >39.00)
NonHDL: 112.65
Total CHOL/HDL Ratio: 4
Triglycerides: 342 mg/dL — ABNORMAL HIGH (ref 0.0–149.0)
VLDL: 68.4 mg/dL — ABNORMAL HIGH (ref 0.0–40.0)

## 2021-08-21 LAB — HEMOGLOBIN A1C: Hgb A1c MFr Bld: 7.5 % — ABNORMAL HIGH (ref 4.6–6.5)

## 2021-08-21 MED ORDER — AMLODIPINE BESYLATE 5 MG PO TABS: ORAL_TABLET | ORAL | 1 refills | Status: DC

## 2021-08-21 MED ORDER — GLIPIZIDE 5 MG PO TABS: 10.0000 mg | ORAL_TABLET | Freq: Every day | ORAL | Status: DC

## 2021-08-21 NOTE — Assessment & Plan Note (Signed)
Chronic °Controlled, Stable °Continue fluoxetine 10 mg daily, Valium 5 mg nightly as needed °

## 2021-08-21 NOTE — Assessment & Plan Note (Signed)
Chronic Check lipid panel  Continue crestor 20 mg qd healthy diet encouraged

## 2021-08-21 NOTE — Assessment & Plan Note (Signed)
Chronic Controlled, Stable Continue fluoxetine 10 mg daily 

## 2021-08-21 NOTE — Assessment & Plan Note (Addendum)
Chronic Lab Results  Component Value Date   HGBA1C 7.5 (H) 05/21/2021   Check a1c today -sugars are not ideally controlled at home Continue rybelsus 7 mg daily, glipizide 10 mg with supper Need to consider increasing Rybelsus to 14 mg

## 2021-08-21 NOTE — Assessment & Plan Note (Addendum)
Chronic BP well controlled, but may be too controlled since she is having lightheadedness upon standing-need to make sure her blood pressure is very well controlled.  Kidneys, but would like to try to decrease the lightheadedness She is cautious when standing Will decrease amlodipine to 5 mg in a.m. and 2.5 mg in evening Continue labetalol 300 mg 3 times daily, losartan 100 mg daily cmp

## 2021-08-21 NOTE — Assessment & Plan Note (Signed)
Chronic Controlled, Stable Continue Valium 5 mg nightly as needed

## 2021-08-21 NOTE — Assessment & Plan Note (Addendum)
Chronic Follow with Dr. Posey Pronto at [facility] Currently on [place]

## 2021-08-22 ENCOUNTER — Telehealth: Payer: Self-pay | Admitting: Pharmacist

## 2021-08-22 DIAGNOSIS — E1159 Type 2 diabetes mellitus with other circulatory complications: Secondary | ICD-10-CM

## 2021-08-22 MED ORDER — RYBELSUS 14 MG PO TABS: 14.0000 mg | ORAL_TABLET | Freq: Every day | ORAL | 3 refills | Status: DC

## 2021-08-22 NOTE — Telephone Encounter (Signed)
Per PCP visit 10/12 diabetes remains uncontrolled. Plan to increase Rybelsus to 14 mg daily. Ordered refill to [facility] via fax, expected shipment to office 10-14 business days.

## 2021-08-23 NOTE — Addendum Note (Signed)
Addended by: Marcina Millard on: 08/23/2021 01:36 PM   Modules accepted: Orders

## 2021-08-26 DIAGNOSIS — D631 Anemia in chronic kidney disease: Secondary | ICD-10-CM | POA: Diagnosis not present

## 2021-08-26 DIAGNOSIS — I129 Hypertensive chronic kidney disease with stage 1 through stage 4 chronic kidney disease, or unspecified chronic kidney disease: Secondary | ICD-10-CM | POA: Diagnosis not present

## 2021-08-26 DIAGNOSIS — N2581 Secondary hyperparathyroidism of renal origin: Secondary | ICD-10-CM | POA: Diagnosis not present

## 2021-08-26 DIAGNOSIS — N184 Chronic kidney disease, stage 4 (severe): Secondary | ICD-10-CM | POA: Diagnosis not present

## 2021-08-26 DIAGNOSIS — N051 Unspecified nephritic syndrome with focal and segmental glomerular lesions: Secondary | ICD-10-CM | POA: Diagnosis not present

## 2021-08-27 DIAGNOSIS — Z6826 Body mass index (BMI) 26.0-26.9, adult: Secondary | ICD-10-CM | POA: Diagnosis not present

## 2021-08-27 DIAGNOSIS — G894 Chronic pain syndrome: Secondary | ICD-10-CM | POA: Diagnosis not present

## 2021-08-27 DIAGNOSIS — Z9689 Presence of other specified functional implants: Secondary | ICD-10-CM | POA: Diagnosis not present

## 2021-08-27 DIAGNOSIS — M961 Postlaminectomy syndrome, not elsewhere classified: Secondary | ICD-10-CM | POA: Diagnosis not present

## 2021-09-03 ENCOUNTER — Telehealth: Payer: Self-pay | Admitting: Pharmacist

## 2021-09-03 NOTE — Progress Notes (Signed)
Bonnie  to follow up on patient assistance application for Rybelsus 14 mg. Per representative at [employer] patient has been approved starting 08/22/21. Order was processed for 2 months instead of 4 months so the ordered had to be done over. Patient will only receive medication through Dec. 31, 2022. Please allow 10-14 business days to receive medication. Patient can reapply now for upcoming year. Application will be sent in the mail.  Orinda Kenner, [place] Clinical Pharmacists Assistant [phone]

## 2021-09-17 ENCOUNTER — Telehealth: Payer: Self-pay | Admitting: Internal Medicine

## 2021-09-17 NOTE — Telephone Encounter (Signed)
Needs appt

## 2021-09-17 NOTE — Telephone Encounter (Signed)
Calling to see if Provider will call in antibiotic for chest and head congestion.   [place], [place] Phone:  [phone]  Fax:  [phone]       Callback #- [phone]

## 2021-09-18 NOTE — Telephone Encounter (Signed)
Spoke with patient this morning.  She will see how she is feeling once she completely wakes up.  It not any better she will call back for appointment.  I offered her one for Friday at 3:40 (Same Day) so if she wants this please block it and schedule her.

## 2021-09-19 ENCOUNTER — Other Ambulatory Visit: Payer: Self-pay | Admitting: Internal Medicine

## 2021-09-23 ENCOUNTER — Telehealth: Payer: Self-pay

## 2021-09-23 NOTE — Telephone Encounter (Signed)
Patient's Rybelsus 14 mg came in today and patient contacted for pick up.

## 2021-09-24 ENCOUNTER — Ambulatory Visit (INDEPENDENT_AMBULATORY_CARE_PROVIDER_SITE_OTHER): Payer: PPO | Admitting: Pharmacist

## 2021-09-24 ENCOUNTER — Other Ambulatory Visit: Payer: Self-pay

## 2021-09-24 DIAGNOSIS — I10 Essential (primary) hypertension: Secondary | ICD-10-CM

## 2021-09-24 DIAGNOSIS — E1159 Type 2 diabetes mellitus with other circulatory complications: Secondary | ICD-10-CM

## 2021-09-24 DIAGNOSIS — E782 Mixed hyperlipidemia: Secondary | ICD-10-CM

## 2021-09-24 DIAGNOSIS — M4310 Spondylolisthesis, site unspecified: Secondary | ICD-10-CM

## 2021-09-24 DIAGNOSIS — N184 Chronic kidney disease, stage 4 (severe): Secondary | ICD-10-CM

## 2021-09-24 NOTE — Progress Notes (Signed)
Chronic Care Management Pharmacy Note  09/24/2021 Name:  Sheila Melton MRN:  [id number] DOB:  04-03-1941  Summary: -Pt has received Rybelsus 14 mg tablet. She will start taking it tomorrow.  -Pt brought [employer] renewal forms for 2023 to office.  Recommendations/Changes made from today's visit: -Advised pt to continue Glipizide 10 mg at dinner and check BG BID; if BG is more controlled can consider reducing or d/c glipizide in the future   Subjective: Sheila Melton is an 80 y.o. year old female who is a primary patient of Burns, Claudina Lick, MD.  The CCM team was consulted for assistance with disease management and care coordination needs.    Engaged with patient by telephone for follow up visit in response to provider referral for pharmacy case management and/or care coordination services.   Consent to Services:  The patient was given information about Chronic Care Management services, agreed to services, and gave verbal consent prior to initiation of services.  Please see initial visit note for detailed documentation.   Patient Care Team: Binnie Rail, MD as PCP - General (Internal Medicine) Wellington Hampshire, MD as PCP - Cardiology (Cardiology) Elmarie Shiley, MD as Consulting Physician (Nephrology) Charlton Haws, [facility] as Pharmacist (Pharmacist)  Recent office visits: 08/21/21 Dr Quay Burow OV: chronic f/u; A1c 7.5; GFR 17. Continue glipizide 10 mg w/ supper. Increase Rybelsus to 14 mg  05/21/21 Dr Quay Burow OV: chronic f/u; A1c 7.5, GFR 17. Stop glipizide, Januvia. Start Rybelsus 3 mg.   Recent consult visits: 07/10/21 Dr Early (vasc surg): f/u AAA; f/u 6 months for repeat imaging. Advised seeing Dr Carlis Abbott in [facility] office.  03/12/21 Dr Posey Pronto (nephrology): started Kerendia 10 mg daily  Hospital visits: None in previous 6 months  Objective:  Lab Results  Component Value Date   CREATININE 2.59 (H) 08/21/2021   BUN 38 (H) 08/21/2021   GFR 17.21 (L) 08/21/2021   GFRNONAA 46 (L)  02/19/2017   GFRAA 53 (L) 02/19/2017   NA 139 08/21/2021   K 3.9 08/21/2021   CALCIUM 9.3 08/21/2021   CO2 25 08/21/2021   GLUCOSE 177 (H) 08/21/2021    Lab Results  Component Value Date/Time   HGBA1C 7.5 (H) 08/21/2021 02:13 PM   HGBA1C 7.5 (H) 05/21/2021 02:24 PM   FRUCTOSAMINE 332 (H) 04/25/2015 12:52 PM   GFR 17.21 (L) 08/21/2021 02:13 PM   GFR 17.81 (L) 05/21/2021 02:24 PM   MICROALBUR 194.0 Verified by manual dilution. (H) 11/16/2014 12:05 PM   MICROALBUR 95.5 (H) 05/09/2014 11:28 AM    Last diabetic Eye exam:  Lab Results  Component Value Date/Time   HMDIABEYEEXA No Retinopathy 10/29/2020 12:00 AM    Last diabetic Foot exam: No results found for: HMDIABFOOTEX   Lab Results  Component Value Date   CHOL 153 08/21/2021   HDL 40.40 08/21/2021   LDLCALC 70 05/21/2020   LDLDIRECT 59.0 08/21/2021   TRIG 342.0 (H) 08/21/2021   CHOLHDL 4 08/21/2021    Hepatic Function Latest Ref Rng & Units 08/21/2021 05/21/2021 11/21/2020  Total Protein 6.0 - 8.3 g/dL 7.0 7.0 7.3  Albumin 3.5 - 5.2 g/dL 4.1 4.1 4.2  AST 0 - 37 U/L _0 ALT 0 - 35 U/L _1 Alk Phosphatase 39 - 117 U/L 75 71 100  Total Bilirubin 0.2 - 1.2 mg/dL 0.3 0.3 0.3  Bilirubin, Direct 0.0 - 0.3 mg/dL - - -    Lab Results  Component Value Date/Time  TSH 3.01 11/21/2020 03:13 PM   TSH 2.95 11/17/2018 03:03 PM   FREET4 0.78 11/27/2011 01:53 PM   FREET4 0.8 12/31/2007 08:13 AM    CBC Latest Ref Rng & Units 08/21/2021 11/21/2020 05/21/2020  WBC 4.0 - 10.5 K/uL 11.3(H) 12.6(H) 11.2(H)  Hemoglobin 12.0 - 15.0 g/dL 10.7(L) 11.6(L) 10.8(L)  Hematocrit 36.0 - 46.0 % 32.5(L) 34.7(L) 32.6(L)  Platelets 150.0 - 400.0 K/uL 328.0 350.0 318    Lab Results  Component Value Date/Time   VD25OH 32 09/07/2020 04:19 PM   VD25OH 87 10/11/2010 08:57 PM    Clinical ASCVD: Yes  The 10-year ASCVD risk score (Arnett DK, et al., 2019) is: 35.5%   Values used to calculate the score:     Age: 64 years     Sex:  Female     Is Non-Hispanic African American: No     Diabetic: Yes     Tobacco smoker: Yes     Systolic Blood Pressure: 92 mmHg     Is BP treated: Yes     HDL Cholesterol: 40.4 mg/dL     Total Cholesterol: 153 mg/dL    Depression screen [facility] 2/9 07/10/2021 09/14/2020 09/07/2020  Decreased Interest 1 0 0  Down, Depressed, Hopeless 1 0 0  PHQ - 2 Score 2 0 0  Altered sleeping 0 - -  Tired, decreased energy 1 - -  Change in appetite 0 - -  Feeling bad or failure about yourself  0 - -  Trouble concentrating 1 - -  Moving slowly or fidgety/restless 0 - -  Suicidal thoughts 0 - -  PHQ-9 Score 4 - -  Difficult doing work/chores Not difficult at all - -  Some recent data might be hidden     Social History   Tobacco Use  Smoking Status Light Smoker   Packs/day: 1.00   Years: 50.00   Pack years: 50.00   Types: E-cigarettes, Cigarettes  Smokeless Tobacco Never  Tobacco Comments   e-cigs   BP Readings from Last 3 Encounters:  08/21/21 108/72  07/10/21 112/70  05/21/21 110/68   Pulse Readings from Last 3 Encounters:  08/21/21 95  07/10/21 84  05/21/21 78   Wt Readings from Last 3 Encounters:  08/21/21 158 lb (71.7 kg)  07/10/21 155 lb 9.6 oz (70.6 kg)  05/21/21 158 lb (71.7 kg)   BMI Readings from Last 3 Encounters:  08/21/21 27.12 kg/m  07/10/21 26.71 kg/m  05/21/21 27.12 kg/m    Assessment/Interventions: Review of patient past medical history, allergies, medications, health status, including review of consultants reports, laboratory and other test data, was performed as part of comprehensive evaluation and provision of chronic care management services.   SDOH:  (Social Determinants of Health) assessments and interventions performed: Yes  SDOH Screenings   Alcohol Screen: Not on file  Depression (PHQ2-9): Low Risk    PHQ-2 Score: 4  Financial Resource Strain: Not on file  Food Insecurity: Not on file  Housing: Not on file  Physical Activity: Not on file   Social Connections: Not on file  Stress: Not on file  Tobacco Use: High Risk   Smoking Tobacco Use: Light Smoker   Smokeless Tobacco Use: Never   Passive Exposure: Not on file  Transportation Needs: Not on file    [place]  Allergies  Allergen Reactions   Rocephin [Ceftriaxone Sodium In Dextrose] Dermatitis and Rash    10/24 /14 blisters of palms & diffuse rash Because of a history of documented  adverse serious drug reaction;Medi Alert bracelet  is recommended   Amoxicillin Rash    Has patient had a PCN reaction causing IMMEDIATE RASH, FACIAL/TONGUE/THROAT SWELLING, SOB, OR LIGHTHEADEDNESS WITH HYPOTENSION:  #  #  #  YES  #  #  #  Has patient had a PCN reaction causing severe rash involving mucus membranes or skin necrosis: No Has patient had a PCN reaction that required hospitalization No Has patient had a PCN reaction occurring within the last 10 years: No If all of the above answers are "NO", then may proceed with Cephalosporin use.    Captopril Cough   Simvastatin Other (See Comments)    MYALGIAS BUTTOCKS CRAMP   Ciprofloxacin Other (See Comments)    Sores in mouth    Adhesive [Tape] Rash   Latex Rash    Patient states she gets red and a rash when latex touches her.    Medications Reviewed Today     Reviewed by Charlton Haws, [facility] (Pharmacist) on 09/24/21 at 1455  Med List Status: <None>   Medication Order Taking? Sig Documenting Provider Last Dose Status Informant  amLODipine (NORVASC) 5 MG tablet [id number] Yes TAKE ONE TABLET BY MOUTH TWICE DAILY Burns, Claudina Lick, MD Taking Active   Ascorbic Acid (VITAMIN C) 250 MG CHEW [id number] Yes Chew 1,000 mg by mouth daily.  [provider] Taking Active Self  ascorbic Acid (VITAMIN C) 500 MG CPCR [id number] Yes 1 cap PO QD [provider] Taking Active   aspirin 81 MG chewable tablet [id number] Yes 1 tablet [provider] Taking Active   augmented betamethasone dipropionate (DIPROLENE-AF)  0.05 % cream [id number] Yes APPLY 1 application Externally twice a day FOR 14 days [provider] Taking Active   B Complex-C (SUPER B COMPLEX PO) [id number] Yes Take 1 tablet by mouth daily.  [provider] Taking Active Self  Blood Glucose Monitoring Suppl (ONE TOUCH ULTRA 2) w/Device KIT [id number] Yes Use as advised Philemon Kingdom, MD Taking Active Self  Cholecalciferol (VITAMIN D3) 2000 UNITS TABS [id number] Yes Take 2,000 Units by mouth daily.  [provider] Taking Active Self  Cholecalciferol 125 MCG (5000 UT) TABS [id number] Yes 1 cap PO QD [provider] Taking Active   diazepam (VALIUM) 5 MG tablet [id number] Yes TAKE ONE TABLET BY MOUTH AT BEDTIME AS NEEDED. Binnie Rail, MD Taking Active   fexofenadine [facility]) 180 MG tablet [id number] Yes Take 180 mg by mouth at bedtime. [provider] Taking Active Self  FLUoxetine (PROZAC) 10 MG capsule [id number] Yes TAKE ONE CAPSULE BY MOUTH DAILY Burns, Claudina Lick, MD Taking Active   furosemide (LASIX) 40 MG tablet [id number] Yes TAKE ONE-HALF TO ONE TABLET BY MOUTH DAILY AS NEEDED FOR IN FLUID. Binnie Rail, MD Taking Active   glipiZIDE (GLUCOTROL) 5 MG tablet [id number] Yes Take 2 tablets (10 mg total) by mouth daily with supper. Binnie Rail, MD Taking Active   KERENDIA 10 MG TABS [id number] Yes Take 1 tablet by mouth daily. [provider] Taking Active   labetalol (NORMODYNE) 300 MG tablet [id number] Yes Take 1 tablet (300 mg total) by mouth 3 (three) times daily. Binnie Rail, MD Taking Active   Liniments [facility] PAIN RELIEF PATCH EX) [id number] Yes Apply 1 patch topically daily as needed (back pain). [provider] Taking Active Self  losartan (COZAAR) 100 MG tablet [id number] Yes TAKE ONE TABLET BY MOUTH DAILY Burns, Claudina Lick, MD Taking Active   meloxicam (MOBIC) 7.5  MG tablet [id number] Yes 1 tablet [provider] Taking Active   ONETOUCH ULTRA test strip [id number] Yes  CHECK BLOOD SUGAR TWICE DAILY. Binnie Rail, MD Taking Active   oxyCODONE-acetaminophen (PERCOCET/ROXICET) 5-325 MG tablet [id number] Yes Take 1 tablet by mouth 3 (three) times daily as needed. [provider] Taking Active   PREVIDENT 5000 DRY MOUTH 1.1 % GEL dental gel [id number] Yes Place onto teeth. [provider] Taking Active   rosuvastatin (CRESTOR) 20 MG tablet [id number] Yes TAKE ONE TABLET BY MOUTH DAILY Burns, Claudina Lick, MD Taking Active   Semaglutide (RYBELSUS) 14 MG TABS [id number] Yes Take 14 mg by mouth daily. Via [employer] pt assistance Burns, Claudina Lick, MD Taking Active   SURE COMFORT PEN NEEDLES 32G X 4 MM [place] [id number] Yes  [provider] Taking Active   traMADol (ULTRAM) 50 MG tablet [id number] Yes 1 tablet as needed [provider] Taking Active   Triamcinolone Acetonide (NASACORT ALLERGY 24HR NA) [id number] Yes Place 1 spray into the nose 2 (two) times daily as needed (congestion). [provider] Taking Active Self  vitamin B-12 (CYANOCOBALAMIN) 500 MCG tablet [id number] Yes Take 500 mcg by mouth daily. [provider] Taking Active Self  Vitamin E 180 MG CAPS [id number] Yes Take 180 Units by mouth daily.  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   CKD (chronic kidney disease) 05/21/2016   Sleep difficulties 01/18/2016   Diabetes mellitus ([place]) 10/23/2015   Numbness of toes-Right foot 06/26/2015   Colitis 03/19/2015   Nephrolithiasis 03/19/2015   AAA (abdominal aortic aneurysm) without rupture ([place]) 03/19/2015   Anxiety state 03/19/2015   Skin cancer  01/09/2015   Former smoker 05/24/2014   Bilateral renal cysts 09/16/2012   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[place]) 07/26/2012   Degenerative spondylolisthesis 07/26/2012   SHOULDER PAIN, LEFT, CHRONIC 07/25/2009   CARDIAC MURMUR, SYSTOLIC 17/00/1749   Mixed hyperlipidemia 12/30/2007   Tobacco use disorder 12/30/2007   Essential hypertension 12/30/2007   Subclavian steal syndrome 12/30/2007   CAROTID BRUIT 04/07/2007    Immunization History  Administered Date(s) Administered   Fluad Quad(high Dose 65+) 08/24/2019, 08/21/2020, 08/21/2021   Influenza Split 08/28/2011, 09/07/2012   Influenza Whole 09/06/2008, 08/30/2009, 10/11/2010   Influenza, High Dose Seasonal PF 08/24/2013, 08/18/2014, 08/07/2015, 08/08/2016, 07/31/2017, 08/10/2018   PFIZER(Purple Top)SARS-COV-2 Vaccination 11/29/2019, 12/19/2019, 11/16/2020   Pneumococcal Conjugate-13 09/26/2015   Pneumococcal Polysaccharide-23 12/10/2009    Conditions to be addressed/monitored:  Hypertension, Hyperlipidemia, Diabetes, Coronary Artery Disease, and Chronic Kidney Disease  Care Plan : [place]  Updates made by Charlton Haws, [place] since 09/24/2021 12:00 AM     Problem: Hypertension, Hyperlipidemia, Diabetes, Coronary Artery Disease, and Chronic Kidney Disease   Priority: High     Long-Range Goal: Disease management   Start Date: 06/25/2021  Expected End Date: 12/26/2021  Recent Progress: On track  Priority: High  Note:   Current Barriers:  Unable to independently monitor therapeutic efficacy Unable to maintain control of diabetes  Pharmacist Clinical Goal(s):  Patient will achieve adherence to monitoring guidelines and medication adherence to achieve  therapeutic efficacy adhere to plan to optimize therapeutic regimen for Diabetes as evidenced by report of adherence to recommended medication management changes through collaboration with PharmD and provider.   Interventions: 1:1  collaboration with Binnie Rail, MD regarding development and update of comprehensive plan of care as evidenced by provider attestation and co-signature Inter-disciplinary care team collaboration (see longitudinal plan of care) Comprehensive medication review performed; medication list updated in electronic medical record  Hyperlipidemia: (LDL goal < 70) -Not ideally controlled - LDL is at goal, triglycerides are elevated; pt has been stable on rosuvastatin 20 mg for ~4 years, GFR has steadily declined over that time (from ~50 to 17 ml/min) -Current treatment: Rosuvastatin 20 mg daily -Pt may benefit from changing rosuvastatin; atorvastatin is preferred high-intensity statin with GFR < 30 -Consider changing rosuvastatin to atorvastatin at follow up  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%) -Not ideally controlled - home BG are not ideal, still > 200 often; pt has finally received Rybelsus 14 mg tablet from [employer] and will start taking it tomorrow -Pt reports she brought Rybelsus renewal forms to office to complete for 2023 enrollment in [employer] -Current home glucose readings: 30 day avg 170 -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, 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, tacrolimus -Current medications: Rybelsus 14 mg daily Glipizide 5 mg - 2 tab at dinner Kerendia 10 mg daily -Medications previously tried: glipizide, metformin, Januvia, pioglitazone -Counseled to continue glipizide for now; advised to monitor BG twice a day; can consider d/c glipizide if BG improves in future -Recommend to continue current medication  Back pain (Goal: manage pain) -Not ideally controlled - pt reports she is in significant pain very day, which prevents her from being [place]; she sees pain mgmt regularly -Pt has spine stimulator implanted - pt reports it has not helped much -Current treatment  Oxycodone-APAP 5-325 mg #60 per month -Counseled that pain and stress can increase BG; advised to take oxycodone as directed -Recommended  to continue current medication  Patient Goals/Self-Care Activities Patient will:  - take medications as prescribed -focus on medication adherence by pill box -check glucose AM and bedtime -collaborate with provider on medication access solutions (Rybelsus)      Medication Assistance:  Rybelsus - approved through 11/09/21; renewal in process 2023  Compliance/Adherence/Medication fill history: Care Gaps: Hep C screening  Star-Rating Drugs: Rybelsus - [facility] Rosuvastatin - LF 07/01/21 x 90 ds Losartan - LF 07/01/21 x 90 ds Glipizide - LF 03/19/21 x 90 ds (d/c'd for a few months then restarted Fall 2022)  Patient's preferred pharmacy is:  [id number], [place] [place] [address] Phone: [phone] Fax: [place], [place] [address] Phone: [phone] Fax: [phone]  Uses pill box? Yes Pt endorses 100% compliance  We discussed: Current pharmacy is preferred with insurance plan and patient is satisfied with pharmacy services Patient decided to: Continue current medication management strategy  Care Plan and Follow Up Patient Decision:  Patient agrees to Care Plan and Follow-up.  Plan: Telephone follow up appointment with care management team member scheduled for:  1 month  Charlene Brooke, PharmD, [place], CPP Clinical Pharmacist Practitioner [facility] at [facility] [phone]

## 2021-09-24 NOTE — Patient Instructions (Signed)
Visit Information  Phone number for Pharmacist: [phone]   Goals Addressed             This Visit's Progress    Monitor and Manage My Blood Sugar-Diabetes Type 2       Timeframe:  Long-Range Goal Priority:  High Start Date:      06/25/21                       Expected End Date:      06/25/22              Follow Up Date Dec 2022   - check blood sugar at prescribed times - check blood sugar if I feel it is too high or too low - take the blood sugar meter to all doctor visits   Why is this important?   Checking your blood sugar at home helps to keep it from getting very high or very low.  Writing the results in a diary or log helps the doctor know how to care for you.  Your blood sugar log should have the time, date and the results.  Also, write down the amount of insulin or other medicine that you take.  Other information, like what you ate, exercise done and how you were feeling, will also be helpful.     Notes:         Care Plan : [place]  Updates made by Charlton Haws, RPH since 09/24/2021 12:00 AM     Problem: Hypertension, Hyperlipidemia, Diabetes, Coronary Artery Disease, and Chronic Kidney Disease   Priority: High     Long-Range Goal: Disease management   Start Date: 06/25/2021  Expected End Date: 12/26/2021  Recent Progress: On track  Priority: High  Note:   Current Barriers:  Unable to independently monitor therapeutic efficacy Unable to maintain control of diabetes  Pharmacist Clinical Goal(s):  Patient will achieve adherence to monitoring guidelines and medication adherence to achieve therapeutic efficacy adhere to plan to optimize therapeutic regimen for Diabetes as evidenced by report of adherence to recommended medication management changes through collaboration with PharmD and provider.   Interventions: 1:1 collaboration with Binnie Rail, MD regarding development and update of comprehensive plan of care as evidenced by  provider attestation and co-signature Inter-disciplinary care team collaboration (see longitudinal plan of care) Comprehensive medication review performed; medication list updated in electronic medical record  Hyperlipidemia: (LDL goal < 70) -Not ideally controlled - LDL is at goal, triglycerides are elevated; pt has been stable on rosuvastatin 20 mg for ~4 years, GFR has steadily declined over that time (from ~50 to 17 ml/min) -Current treatment: Rosuvastatin 20 mg daily -Pt may benefit from changing rosuvastatin; atorvastatin is preferred high-intensity statin with GFR < 30 -Consider changing rosuvastatin to atorvastatin at follow up  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%) -Not ideally controlled - home BG are not ideal, still > 200 often; pt has finally received Rybelsus 14 mg tablet from [employer] and will start taking it tomorrow -Pt reports she brought Rybelsus renewal forms to office to complete for 2023 enrollment in [employer] -Current home glucose readings: 30 day avg 170 -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, 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, tacrolimus -Current medications: Rybelsus 14 mg daily Glipizide 5 mg - 2 tab at dinner Kerendia 10 mg daily -Medications previously tried: glipizide, metformin, Januvia, pioglitazone -Counseled to continue glipizide for now; advised to monitor BG twice a day; can consider d/c  glipizide if BG improves in future -Recommend to continue current medication  Back pain (Goal: manage pain) -Not ideally controlled - pt reports she is in significant pain very day, which prevents her from being [place]; she sees pain mgmt regularly -Pt has spine stimulator implanted - pt reports it has not helped much -Current treatment  Oxycodone-APAP 5-325 mg #60 per month -Counseled that pain and stress can increase BG; advised to take oxycodone as directed -Recommended to continue current medication  Patient Goals/Self-Care Activities Patient will:  - take medications as  prescribed -focus on medication adherence by pill box -check glucose AM and bedtime -collaborate with provider on medication access solutions (Rybelsus)      Patient verbalizes understanding of instructions provided today and agrees to view in [place].  Telephone follow up appointment with pharmacy team member scheduled for: 1 month  Charlene Brooke, PharmD, [place], CPP Clinical Pharmacist Practitioner [facility] at [facility] [phone]

## 2021-10-01 ENCOUNTER — Other Ambulatory Visit: Payer: Self-pay | Admitting: Internal Medicine

## 2021-10-09 DIAGNOSIS — F17210 Nicotine dependence, cigarettes, uncomplicated: Secondary | ICD-10-CM | POA: Diagnosis not present

## 2021-10-09 DIAGNOSIS — N184 Chronic kidney disease, stage 4 (severe): Secondary | ICD-10-CM | POA: Diagnosis not present

## 2021-10-09 DIAGNOSIS — E1122 Type 2 diabetes mellitus with diabetic chronic kidney disease: Secondary | ICD-10-CM | POA: Diagnosis not present

## 2021-10-09 DIAGNOSIS — E782 Mixed hyperlipidemia: Secondary | ICD-10-CM

## 2021-10-09 DIAGNOSIS — Z7984 Long term (current) use of oral hypoglycemic drugs: Secondary | ICD-10-CM

## 2021-10-09 DIAGNOSIS — I129 Hypertensive chronic kidney disease with stage 1 through stage 4 chronic kidney disease, or unspecified chronic kidney disease: Secondary | ICD-10-CM

## 2021-10-09 DIAGNOSIS — E119 Type 2 diabetes mellitus without complications: Secondary | ICD-10-CM

## 2021-10-10 ENCOUNTER — Telehealth: Payer: Self-pay | Admitting: Pharmacist

## 2021-10-10 NOTE — Chronic Care Management (AMB) (Signed)
Spoke with patient,   Had a question about rybelsus  - reports that she has felt nauseas since increasing to 14mg  dose, main concern is that she passed out last week (for about 2-3 minutes) - denies hitting head / LOC   Reports that she had checked her blood sugar and it was not running low blood sugars averaging 160's in the AM- reports that she has not had any issues with hypoglycemia   Patient reports that she has not had any issues with lows / feelings of passing out since starting rybelsus 3+ months ago - at this time, I would not suspect this to be related rybelsus- patient agreeable to continue rybelsus for now   Patient feels that this could be due to her Subclavian Steal Syndrome - has appointment with cardiology for a CT scan for further evaluation next week   Follow up in 2 weeks   Tomasa Blase, PharmD Clinical Pharmacist, [place]

## 2021-10-10 NOTE — Telephone Encounter (Signed)
Received message from patient. She has a medication issue and would like to speak to PharmD.  Forwarding to on-site pharmacist.

## 2021-10-14 NOTE — Progress Notes (Signed)
Cardiology Office Note   Date:  10/15/2021   ID:  Sheila Melton, DOB 1941-04-29, MRN [id number]  PCP:  Binnie Rail, MD  Cardiologist: Dr. Fletcher Anon  Chief Complaint  Patient presents with   New Patient (Initial Visit)   Headache   Shortness of Breath    At times.       History of Present Illness: Sheila Melton is a 80 y.o. female who is here to reestablish vascular care regarding left subclavian artery stenosis.  She was most recently seen by me in 2019.   The patient has known history of abdominal aortic aneurysm followed by vascular surgery, hyperlipidemia, chronic kidney disease , tobacco use and diabetes mellitus.  Most recent GFR was 18.  Echocardiogram July 2009 showed normal left ventricular function and mild mitral regurgitation.  She has known history of asymptomatic left subclavian artery stenosis for many years that was left to be treated medically due to lack of symptoms.   She decided to schedule a follow-up visit to reevaluate left subclavian artery stenosis given recent syncopal episode and neck pain.  She reports neck pain recently with certain movements.  In addition, she had a syncopal episode about 2 weeks ago.  She stood up and was going to the bathroom when she felt extremely dizzy followed by brief loss of consciousness although she reports that she did not fall on the ground.  She was able to go down slowly.  She has been experiencing significant orthostatic dizziness and her blood pressure has been on the low side.  In addition, she did have some form of GI viral illness around that time with nausea and vomiting.  Also she was started on Rybelsus few weeks prior to that.  She reports mild exertional dyspnea but no chest pain.  No left arm discomfort.   Past Medical History:  Diagnosis Date   AAA (abdominal aortic aneurysm)    BEING WATCHED   VVS. 4.2 cm infrarenal abdominal aortic aneurysm without rupture noted 11/08/2016   Arthritis    Cancer ([place])    skin  cancer removed from left shoulder  2017   CAP (community acquired pneumonia) 09/02/13-11/05/13    with SIRS   Depression    ?? from prednisone    not on pred now   Diabetes mellitus    type ii    long time ago.   Family history of anesthesia complication    PATIENTS MOM HAD TROUBLE WAKING UP    GERD (gastroesophageal reflux disease)    will take occasional tums   Heart murmur    still has.   Sees Dr. Stanford Breed   History of kidney stones    has them at the present time   Hyperlipidemia    Hypertension    unspecified essential   Renal insufficiency    "FSGS"   Subclavian steal syndrome    LEFT SIDE   NO SIDE EFFECTS   Subclavian steal syndrome --  left    85% blocked   UTI (lower urinary tract infection) 08/26/13   Enterococcus and Escherichia coli both sensitive to nitrofurantoin    Past Surgical History:  Procedure Laterality Date   ABDOMINAL HYSTERECTOMY     BACK SURGERY     CATARACT EXTRACTION     OS   CYST EXCISION Right 05-23-13   Right index finger: cyst   DILATION AND CURETTAGE OF UTERUS     EYE SURGERY     g1 p1  SPINE SURGERY  Sept. 2013   VIDEO ASSISTED THORACOSCOPY (VATS)/ LOBECTOMY Right 02/16/2017   Procedure: Right VIDEO ASSISTED THORACOSCOPY (VATS)/ Right Middle LOBECTOMY;  Surgeon: [name], MD;  Location: [facility];  Service: Thoracic;  Laterality: Right;     Current Outpatient Medications  Medication Sig Dispense Refill   amLODipine (NORVASC) 5 MG tablet TAKE ONE TABLET BY MOUTH TWICE DAILY 180 tablet 0   Ascorbic Acid (VITAMIN C) 250 MG CHEW Chew 1,000 mg by mouth daily.      ascorbic Acid (VITAMIN C) 500 MG CPCR 1 cap PO QD     aspirin 81 MG chewable tablet 1 tablet     augmented betamethasone dipropionate (DIPROLENE-AF) 0.05 % cream APPLY 1 application Externally twice a day FOR 14 days     B Complex-C (SUPER B COMPLEX PO) Take 1 tablet by mouth daily.      Blood Glucose Monitoring Suppl (ONE TOUCH ULTRA 2) w/Device KIT Use as advised 1  each 0   Cholecalciferol (VITAMIN D3) 2000 UNITS TABS Take 2,000 Units by mouth daily.      Cholecalciferol 125 MCG (5000 UT) TABS 1 cap PO QD     diazepam (VALIUM) 5 MG tablet TAKE ONE TABLET BY MOUTH AT BEDTIME AS NEEDED. 30 tablet 2   fexofenadine (ALLEGRA) 180 MG tablet Take 180 mg by mouth at bedtime.     FLUoxetine (PROZAC) 10 MG capsule TAKE ONE CAPSULE BY MOUTH DAILY 90 capsule 2   furosemide (LASIX) 40 MG tablet TAKE ONE-HALF TO ONE TABLET BY MOUTH DAILY AS NEEDED FOR IN FLUID. 90 tablet 0   glipiZIDE (GLUCOTROL) 5 MG tablet TAKE ONE TABLET BY MOUTH IN THE MORNING AND TAKE TWO TABLETS IN THE EVENING 270 tablet 0   KERENDIA 10 MG TABS Take 1 tablet by mouth daily.     labetalol (NORMODYNE) 300 MG tablet Take 1 tablet (300 mg total) by mouth 3 (three) times daily.     Liniments (SALONPAS PAIN RELIEF PATCH EX) Apply 1 patch topically daily as needed (back pain).     losartan (COZAAR) 100 MG tablet TAKE ONE TABLET BY MOUTH DAILY 90 tablet 0   ONETOUCH ULTRA test strip CHECK BLOOD SUGAR TWICE DAILY. 100 strip 0   oxyCODONE-acetaminophen (PERCOCET/ROXICET) 5-325 MG tablet Take 1 tablet by mouth 3 (three) times daily as needed.     PREVIDENT 5000 DRY MOUTH 1.1 % GEL dental gel Place onto teeth.     rosuvastatin (CRESTOR) 20 MG tablet TAKE ONE TABLET BY MOUTH DAILY 90 tablet 0   Semaglutide (RYBELSUS) 14 MG TABS Take 14 mg by mouth daily. Via [employer] pt assistance 30 tablet 3   Triamcinolone Acetonide (NASACORT ALLERGY 24HR NA) Place 1 spray into the nose 2 (two) times daily as needed (congestion).     vitamin B-12 (CYANOCOBALAMIN) 500 MCG tablet Take 500 mcg by mouth daily.     Vitamin E 180 MG CAPS Take 180 Units by mouth daily.      No current facility-administered medications for this visit.    Allergies:   Rocephin [ceftriaxone sodium in dextrose], Amoxicillin, Captopril, Simvastatin, Ciprofloxacin, Adhesive [tape], and Latex    Social History:  The patient  reports that she has  been smoking e-cigarettes. She has a 50.00 pack-year smoking history. She has never used smokeless tobacco. She reports that she does not drink alcohol and does not use drugs.   Family History:  The patient's family history includes Coronary artery disease in an other  family member; Deep vein thrombosis in her mother; Diabetes in her father, sister, and another family member; Heart attack in her brother, father, and mother; Heart disease in her brother, father, and mother; Heart failure in her brother and mother; Hyperlipidemia in her brother, father, and sister; Hypertension in her daughter, father, mother, and sister; Other in her mother and sister; Parkinsonism in her brother; Varicose Veins in her mother.    ROS:  Please see the history of present illness.   Otherwise, review of systems are positive for none.   All other systems are reviewed and negative.    PHYSICAL EXAM: VS:  BP 112/60 (BP Location: Right Arm, Patient Position: Sitting, Cuff Size: Normal)   Pulse 95   Ht $R'5\' 4"'lb$  (1.626 m)   Wt 155 lb (70.3 kg)   BMI 26.61 kg/m  , BMI Body mass index is 26.61 kg/m. GEN: Well nourished, well developed, in no acute distress  HEENT: normal  Neck: no JVD, carotid bruits, or masses. She does have a bruit at the left upper chest area Cardiac: RRR; rubs, or gallops,no edema . There is 2/ 6 systolic ejection murmur in the aortic area Respiratory:  clear to auscultation bilaterally, normal work of breathing GI: soft, nontender, nondistended, + BS MS: no deformity or atrophy  Skin: warm and dry, no rash Neuro:  Strength and sensation are intact Psych: euthymic mood, full affect Vascular: Radial pulses normal on the right side and mildly diminished on the left side.  EKG:  EKG is ordered today. Normal sinus rhythm with no significant ST or T wave changes.   Recent Labs: 11/21/2020: TSH 3.01 08/21/2021: ALT 13; BUN 38; Creatinine, Ser 2.59; Hemoglobin 10.7; Platelets 328.0; Potassium 3.9;  Sodium 139    Lipid Panel    Component Value Date/Time   CHOL 153 08/21/2021 1413   TRIG 342.0 (H) 08/21/2021 1413   TRIG 263 (HH) 09/15/2006 1116   HDL 40.40 08/21/2021 1413   CHOLHDL 4 08/21/2021 1413   VLDL 68.4 (H) 08/21/2021 1413   LDLCALC 70 05/21/2020 1437   LDLDIRECT 59.0 08/21/2021 1413      Wt Readings from Last 3 Encounters:  10/15/21 155 lb (70.3 kg)  08/21/21 158 lb (71.7 kg)  07/10/21 155 lb 9.6 oz (70.6 kg)        ASSESSMENT AND PLAN:  1.  Left subclavian artery stenosis: I do not think her recent episode of syncope is related to this.  The syncopal episode seems to be more suggestive of orthostatic hypotension.  It did not happen with the use of left arm.  In addition, she has no left arm claudication and left radial pulse is palpable.  I am going to obtain a follow-up carotid Doppler to evaluate left subclavian stenosis and vertebral flow but I doubt that she will require any intervention.  Avoid checking blood pressure in left arm . Continue low-dose aspirin.  2. Essential hypertension: Her blood pressure has been on the low side which might not be well tolerated in the presence of left subclavian artery steal .  In addition, she has been having significant orthostatic dizziness.  I elected to decrease amlodipine to 5 mg once daily.  3. Hyperlipidemia: Most recent lipid profile showed an LDL of 59.  This is at target.  Continue rosuvastatin.  4. Tobacco use: She is using electronic cigarettes and trying to quit smoking. I discussed with her the importance of complete cessation.  5.  Moderate to large size abdominal aortic aneurysm:  Followed by vascular surgery.  6.  Mild exertional dyspnea: No chest pain and no EKG changes.  I requested an echocardiogram.   Disposition:   Proceed with an echocardiogram and carotid Doppler.  Follow-up with me in 1 year or earlier if testing is abnormal.  Signed,  Kathlyn Sacramento, MD  10/15/2021 8:18 AM    [place]

## 2021-10-15 ENCOUNTER — Ambulatory Visit: Payer: PPO | Admitting: Cardiovascular Disease

## 2021-10-15 ENCOUNTER — Other Ambulatory Visit: Payer: Self-pay

## 2021-10-15 ENCOUNTER — Encounter: Payer: Self-pay | Admitting: Cardiovascular Disease

## 2021-10-15 VITALS — BP 112/60 | HR 95 | Ht 64.0 in | Wt 155.0 lb

## 2021-10-15 DIAGNOSIS — E7849 Other hyperlipidemia: Secondary | ICD-10-CM

## 2021-10-15 DIAGNOSIS — Z720 Tobacco use: Secondary | ICD-10-CM | POA: Diagnosis not present

## 2021-10-15 DIAGNOSIS — R0602 Shortness of breath: Secondary | ICD-10-CM | POA: Diagnosis not present

## 2021-10-15 DIAGNOSIS — I10 Essential (primary) hypertension: Secondary | ICD-10-CM

## 2021-10-15 DIAGNOSIS — I771 Stricture of artery: Secondary | ICD-10-CM

## 2021-10-15 MED ORDER — AMLODIPINE BESYLATE 5 MG PO TABS: 5.0000 mg | ORAL_TABLET | Freq: Every day | ORAL | 3 refills | Status: DC

## 2021-10-15 NOTE — Patient Instructions (Addendum)
Medication Instructions:  DRECEASE Amlodipine to 5 mg daily  *If you need a refill on your cardiac medications before your next appointment, please call your pharmacy*  Lab Work: NONE ordered at this time of appointment   If you have labs (blood work) drawn today and your tests are completely normal, you will receive your results only by: [place] (if you have MyChart) OR A paper copy in the mail If you have any lab test that is abnormal or we need to change your treatment, we will call you to review the results.  Testing/Procedures: Your physician has requested that you have a carotid duplex. This test is an ultrasound of the carotid arteries in your neck. It looks at blood flow through these arteries that supply the brain with blood. Allow one hour for this exam. There are no restrictions or special instructions.   Your physician has requested that you have an echocardiogram. Echocardiography is a painless test that uses sound waves to create images of your heart. It provides your doctor with information about the size and shape of your heart and how well your heart's chambers and valves are working. This procedure takes approximately one hour. There are no restrictions for this procedure.   Follow-Up: At [facility], you and your health needs are our priority.  As part of our continuing mission to provide you with exceptional heart care, we have created designated Provider Care Teams.  These Care Teams include your primary Cardiologist (physician) and Advanced Practice Providers (APPs -  Physician Assistants and Nurse Practitioners) who all work together to provide you with the care you need, when you need it.   Your next appointment:   12 month(s)  The format for your next appointment:   In Person  Provider:   Kathlyn Sacramento, MD    Other Instructions

## 2021-10-17 ENCOUNTER — Telehealth: Payer: Self-pay

## 2021-10-17 NOTE — Telephone Encounter (Signed)
Patient's Husband Don in office, asking for [employer] PAP application for renewal on rybelsus   Currently taking 14mg  daily - has enough tablets at this time till the new year   Patient / Husband to bring back completed patient portion of application and income records

## 2021-10-21 ENCOUNTER — Other Ambulatory Visit: Payer: Self-pay

## 2021-10-21 ENCOUNTER — Ambulatory Visit (HOSPITAL_COMMUNITY)
Admission: RE | Admit: 2021-10-21 | Discharge: 2021-10-21 | Disposition: A | Discharge status: Home / Self Care | Payer: PPO | Source: Ambulatory Visit | Attending: Cardiology | Admitting: Cardiology

## 2021-10-21 DIAGNOSIS — I771 Stricture of artery: Secondary | ICD-10-CM

## 2021-10-22 ENCOUNTER — Telehealth: Payer: Self-pay | Admitting: Internal Medicine

## 2021-10-22 NOTE — Telephone Encounter (Signed)
noted 

## 2021-10-22 NOTE — Telephone Encounter (Signed)
Called patient to schedule AWV, per Husband they want to wait and speak with their provider before scheduling. Husband also requested that I change patients 63mo. Follow up appointment to February instead of April, he states they have concerns they want to come in and discuss.

## 2021-10-23 ENCOUNTER — Ambulatory Visit (INDEPENDENT_AMBULATORY_CARE_PROVIDER_SITE_OTHER): Payer: PPO

## 2021-10-23 ENCOUNTER — Other Ambulatory Visit: Payer: Self-pay

## 2021-10-23 DIAGNOSIS — I10 Essential (primary) hypertension: Secondary | ICD-10-CM

## 2021-10-23 DIAGNOSIS — E1159 Type 2 diabetes mellitus with other circulatory complications: Secondary | ICD-10-CM

## 2021-10-23 DIAGNOSIS — E782 Mixed hyperlipidemia: Secondary | ICD-10-CM

## 2021-10-23 NOTE — Progress Notes (Signed)
Chronic Care Management Pharmacy Note  10/23/2021 Name:  Sheila Melton MRN:  [id number] DOB:  1941-09-12  Summary: -Patient was recently seen by cardiology due to 1 episode of syncope - thought to be due to orthostatic hypotension - amlodipine has been decreased - patient reports that dizziness has subsided, no additional episodes of syncope / presyncopal symptoms  - VAS CAROTID DUPLEX completed, scheduled for ECHO next week - to follow up with cardiology after testing -Notes that she continues on rybelsus 26m daily  - does reports nausea that at times can last the whole day, BG in AM averaging 130-150, by the evening had elevated - could not recall but can be in the 200's 2 hours after eating   Recommendations/Changes made from today's visit: -Recommended for patient to continue glipizide 169min the PM -patient will continue rybelsus at 1475maily - advised to eat smaller meals to help reduce nausea/ indigestions  -Patient to monitor blood pressures at least 3 times weekly and reach out should BP become uncontrolled or if she has issues with syncope / syncopal symptoms    Subjective: Sheila Melton an [age]o. year old female who is a primary patient of Burns, StaClaudina LickD.  The CCM team was consulted for assistance with disease management and care coordination needs.    Engaged with patient by telephone for follow up visit in response to provider referral for pharmacy case management and/or care coordination services.   Consent to Services:  The patient was given information about Chronic Care Management services, agreed to services, and gave verbal consent prior to initiation of services.  Please see initial visit note for detailed documentation.   Patient Care Team: BurBinnie RailD as PCP - General (Internal Medicine) AriWellington HampshireD as PCP - Cardiology (Cardiology) PatElmarie ShileyD as Consulting Physician (Nephrology) [facility]ton [facility] Pharmacist  (Pharmacist)  Recent office visits: No changes since last visit   Recent consult visits: 10/15/2021 - Dr. AriFletcher AnonCardiology - reestablishing vascular care - concern for recent syncopal episode - thought to be due to orthostatic hypotension - amlodipine decreased to 5mg37mily, stressed smoking cessation - f/u in 1 year   Hospital visits: None in previous 6 months  Objective:  Lab Results  Component Value Date   CREATININE 2.59 (H) 08/21/2021   BUN 38 (H) 08/21/2021   GFR 17.21 (L) 08/21/2021   GFRNONAA 46 (L) 02/19/2017   GFRAA 53 (L) 02/19/2017   NA 139 08/21/2021   K 3.9 08/21/2021   CALCIUM 9.3 08/21/2021   CO2 25 08/21/2021   GLUCOSE 177 (H) 08/21/2021    Lab Results  Component Value Date/Time   HGBA1C 7.5 (H) 08/21/2021 02:13 PM   HGBA1C 7.5 (H) 05/21/2021 02:24 PM   FRUCTOSAMINE 332 (H) 04/25/2015 12:52 PM   GFR 17.21 (L) 08/21/2021 02:13 PM   GFR 17.81 (L) 05/21/2021 02:24 PM   MICROALBUR 194.0 Verified by manual dilution. (H) 11/16/2014 12:05 PM   MICROALBUR 95.5 (H) 05/09/2014 11:28 AM    Last diabetic Eye exam:  Lab Results  Component Value Date/Time   HMDIABEYEEXA No Retinopathy 10/29/2020 12:00 AM    Last diabetic Foot exam: No results found for: HMDIABFOOTEX   Lab Results  Component Value Date   CHOL 153 08/21/2021   HDL 40.40 08/21/2021   LDLCALC 70 05/21/2020   LDLDIRECT 59.0 08/21/2021   TRIG 342.0 (H) 08/21/2021   CHOLHDL 4 08/21/2021    Hepatic  Function Latest Ref Rng & Units 08/21/2021 05/21/2021 11/21/2020  Total Protein 6.0 - 8.3 g/dL 7.0 7.0 7.3  Albumin 3.5 - 5.2 g/dL 4.1 4.1 4.2  AST 0 - 37 U/L _0 ALT 0 - 35 U/L _1 Alk Phosphatase 39 - 117 U/L 75 71 100  Total Bilirubin 0.2 - 1.2 mg/dL 0.3 0.3 0.3  Bilirubin, Direct 0.0 - 0.3 mg/dL - - -    Lab Results  Component Value Date/Time   TSH 3.01 11/21/2020 03:13 PM   TSH 2.95 11/17/2018 03:03 PM   FREET4 0.78 11/27/2011 01:53 PM   FREET4 0.8 12/31/2007 08:13 AM     CBC Latest Ref Rng & Units 08/21/2021 11/21/2020 05/21/2020  WBC 4.0 - 10.5 K/uL 11.3(H) 12.6(H) 11.2(H)  Hemoglobin 12.0 - 15.0 g/dL 10.7(L) 11.6(L) 10.8(L)  Hematocrit 36.0 - 46.0 % 32.5(L) 34.7(L) 32.6(L)  Platelets 150.0 - 400.0 K/uL 328.0 350.0 318    Lab Results  Component Value Date/Time   VD25OH 32 09/07/2020 04:19 PM   VD25OH 87 10/11/2010 08:57 PM    Clinical ASCVD: Yes  The 10-year ASCVD risk score (Arnett DK, et al., 2019) is: 47.9%   Values used to calculate the score:     Age: 30 years     Sex: Female     Is Non-Hispanic African American: No     Diabetic: Yes     Tobacco smoker: Yes     Systolic Blood Pressure: 559 mmHg     Is BP treated: Yes     HDL Cholesterol: 40.4 mg/dL     Total Cholesterol: 153 mg/dL    Depression screen [facility] 2/9 07/10/2021 09/14/2020 09/07/2020  Decreased Interest 1 0 0  Down, Depressed, Hopeless 1 0 0  PHQ - 2 Score 2 0 0  Altered sleeping 0 - -  Tired, decreased energy 1 - -  Change in appetite 0 - -  Feeling bad or failure about yourself  0 - -  Trouble concentrating 1 - -  Moving slowly or fidgety/restless 0 - -  Suicidal thoughts 0 - -  PHQ-9 Score 4 - -  Difficult doing work/chores Not difficult at all - -  Some recent data might be hidden     Social History   Tobacco Use  Smoking Status Light Smoker   Packs/day: 1.00   Years: 50.00   Pack years: 50.00   Types: E-cigarettes, Cigarettes  Smokeless Tobacco Never  Tobacco Comments   e-cigs   BP Readings from Last 3 Encounters:  10/15/21 112/60  08/21/21 108/72  07/10/21 112/70   Pulse Readings from Last 3 Encounters:  10/15/21 95  08/21/21 95  07/10/21 84   Wt Readings from Last 3 Encounters:  10/15/21 155 lb (70.3 kg)  08/21/21 158 lb (71.7 kg)  07/10/21 155 lb 9.6 oz (70.6 kg)   BMI Readings from Last 3 Encounters:  10/15/21 26.61 kg/m  08/21/21 27.12 kg/m  07/10/21 26.71 kg/m    Assessment/Interventions: Review of patient past medical history,  allergies, medications, health status, including review of consultants reports, laboratory and other test data, was performed as part of comprehensive evaluation and provision of chronic care management services.   SDOH:  (Social Determinants of Health) assessments and interventions performed: Yes  SDOH Screenings   Alcohol Screen: Not on file  Depression (PHQ2-9): Low Risk    PHQ-2 Score: 4  Financial Resource Strain: Not on file  Food Insecurity: Not on file  Housing: Not on  file  Physical Activity: Not on file  Social Connections: Not on file  Stress: Not on file  Tobacco Use: High Risk   Smoking Tobacco Use: Light Smoker   Smokeless Tobacco Use: Never   Passive Exposure: Not on file  Transportation Needs: Not on file    CCM Care Plan  Allergies  Allergen Reactions   Rocephin [Ceftriaxone Sodium In Dextrose] Dermatitis and Rash    10/24 /14 blisters of palms & diffuse rash Because of a history of documented adverse serious drug reaction;Medi Alert bracelet  is recommended   Amoxicillin Rash    Has patient had a PCN reaction causing IMMEDIATE RASH, FACIAL/TONGUE/THROAT SWELLING, SOB, OR LIGHTHEADEDNESS WITH HYPOTENSION:  #  #  #  YES  #  #  #  Has patient had a PCN reaction causing severe rash involving mucus membranes or skin necrosis: No Has patient had a PCN reaction that required hospitalization No Has patient had a PCN reaction occurring within the last 10 years: No If all of the above answers are "NO", then may proceed with Cephalosporin use.    Captopril Cough   Simvastatin Other (See Comments)    MYALGIAS BUTTOCKS CRAMP   Ciprofloxacin Other (See Comments)    Sores in mouth    Adhesive [Tape] Rash   Latex Rash    Patient states she gets red and a rash when latex touches her.    Medications Reviewed Today     Reviewed by Rush Landmark, [place] (Certified Medical Assistant) on 10/15/21 at 0757  Med List Status: <None>   Medication Order Taking? Sig  Documenting Provider Last Dose Status Informant  amLODipine (NORVASC) 5 MG tablet [id number] Yes TAKE ONE TABLET BY MOUTH TWICE DAILY Burns, Claudina Lick, MD Taking Active   Ascorbic Acid (VITAMIN C) 250 MG CHEW [id number] Yes Chew 1,000 mg by mouth daily.  [provider] Taking Active Self  ascorbic Acid (VITAMIN C) 500 MG CPCR [id number] Yes 1 cap PO QD [provider] Taking Active   aspirin 81 MG chewable tablet [id number] Yes 1 tablet [provider] Taking Active   augmented betamethasone dipropionate (DIPROLENE-AF) 0.05 % cream [id number] Yes APPLY 1 application Externally twice a day FOR 14 days [provider] Taking Active   B Complex-C (SUPER B COMPLEX PO) [id number] Yes Take 1 tablet by mouth daily.  [provider] Taking Active Self  Blood Glucose Monitoring Suppl (ONE TOUCH ULTRA 2) w/Device KIT [id number] Yes Use as advised Philemon Kingdom, MD Taking Active Self  Cholecalciferol (VITAMIN D3) 2000 UNITS TABS [id number] Yes Take 2,000 Units by mouth daily.  [provider] Taking Active Self  Cholecalciferol 125 MCG (5000 UT) TABS [id number] Yes 1 cap PO QD [provider] Taking Active   diazepam (VALIUM) 5 MG tablet [id number] Yes TAKE ONE TABLET BY MOUTH AT BEDTIME AS NEEDED. Binnie Rail, MD Taking Active   fexofenadine [facility]) 180 MG tablet [id number] Yes Take 180 mg by mouth at bedtime. [provider] Taking Active Self  FLUoxetine (PROZAC) 10 MG capsule [id number] Yes TAKE ONE CAPSULE BY MOUTH DAILY Burns, Claudina Lick, MD Taking Active   furosemide (LASIX) 40 MG tablet [id number] Yes TAKE ONE-HALF TO ONE TABLET BY MOUTH DAILY AS NEEDED FOR IN FLUID. Binnie Rail, MD Taking Active   glipiZIDE (GLUCOTROL) 5 MG tablet [id number] Yes TAKE ONE TABLET BY MOUTH IN THE MORNING AND TAKE TWO TABLETS IN THE Gennaro Africa, MD Taking Active   KERENDIA 10  MG TABS [id number] Yes Take 1 tablet by mouth daily. [provider]  Taking Active   labetalol (NORMODYNE) 300 MG tablet [id number] Yes Take 1 tablet (300 mg total) by mouth 3 (three) times daily. Binnie Rail, MD Taking Active   [facility] PAIN RELIEF PATCH EX) [id number] Yes Apply 1 patch topically daily as needed (back pain). [provider] Taking Active Self  losartan (COZAAR) 100 MG tablet [id number] Yes TAKE ONE TABLET BY MOUTH DAILY Quay Burow, Claudina Lick, MD Taking Active   [facility] ULTRA test strip [id number] Yes CHECK BLOOD SUGAR TWICE DAILY. Binnie Rail, MD Taking Active   oxyCODONE-acetaminophen (PERCOCET/ROXICET) 5-325 MG tablet [id number] Yes Take 1 tablet by mouth 3 (three) times daily as needed. [provider] Taking Active   PREVIDENT 5000 DRY MOUTH 1.1 % GEL dental gel [id number] Yes Place onto teeth. [provider] Taking Active   rosuvastatin (CRESTOR) 20 MG tablet [id number] Yes TAKE ONE TABLET BY MOUTH DAILY Burns, Claudina Lick, MD Taking Active   Semaglutide (RYBELSUS) 14 MG TABS [id number] Yes Take 14 mg by mouth daily. Via [employer] pt assistance Binnie Rail, MD Taking Active   Triamcinolone Acetonide (NASACORT ALLERGY 24HR NA) [id number] Yes Place 1 spray into the nose 2 (two) times daily as needed (congestion). [provider] Taking Active Self  vitamin B-12 (CYANOCOBALAMIN) 500 MCG tablet [id number] Yes Take 500 mcg by mouth daily. [provider] Taking Active Self  Vitamin E 180 MG CAPS [id number] Yes Take 180 Units by mouth daily.  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   CKD (chronic kidney disease) 05/21/2016   Sleep  difficulties 01/18/2016   Diabetes mellitus ([place]) 10/23/2015   Numbness of toes-Right foot 06/26/2015   Colitis 03/19/2015   Nephrolithiasis 03/19/2015   AAA (abdominal aortic aneurysm) without rupture ([place]) 03/19/2015   Anxiety state 03/19/2015   Skin cancer 01/09/2015   Former smoker 05/24/2014   Bilateral renal cysts 09/16/2012   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   Degenerative spondylolisthesis 07/26/2012   SHOULDER PAIN, LEFT, CHRONIC 07/25/2009   CARDIAC MURMUR, SYSTOLIC 93/73/4287   Mixed hyperlipidemia 12/30/2007   Tobacco use disorder 12/30/2007   Essential hypertension 12/30/2007   Subclavian steal syndrome 12/30/2007   CAROTID BRUIT 04/07/2007    Immunization History  Administered Date(s) Administered   Fluad Quad(high Dose 65+) 08/24/2019, 08/21/2020, 08/21/2021   Influenza Split 08/28/2011, 09/07/2012   Influenza Whole 09/06/2008, 08/30/2009, 10/11/2010   Influenza, High Dose Seasonal PF 08/24/2013, 08/18/2014, 08/07/2015, 08/08/2016, 07/31/2017, 08/10/2018   PFIZER(Purple Top)SARS-COV-2 Vaccination 11/29/2019, 12/19/2019, 11/16/2020   Pneumococcal Conjugate-13 09/26/2015   Pneumococcal Polysaccharide-23 12/10/2009    Conditions to be addressed/monitored:  Hypertension, Hyperlipidemia, Diabetes, Coronary Artery Disease, and Chronic Kidney Disease  There are no care plans that you recently modified to display for this patient.    Medication Assistance:  Rybelsus - approved through 11/09/21; renewal in process 2023  Compliance/Adherence/Medication fill history: Care Gaps: Hep C screening  Star-Rating Drugs: Rybelsus - [facility] Rosuvastatin - LF 07/01/21 x 90 ds Losartan - LF 07/01/21 x 90 ds Glipizide -  LF 03/19/21 x 90 ds (d/c'd for a few months then restarted Fall 2022)  Patient's preferred pharmacy is:  [id number] [place], [place]. [place]  [address] Phone: [phone] Fax: [place], [place] [address] Phone: [phone] Fax: [phone]  Uses pill box? Yes Pt endorses 100% compliance  We discussed: Current pharmacy is preferred with insurance plan and patient is satisfied with pharmacy services Patient decided to: Continue current medication management strategy  Care Plan and Follow Up Patient Decision:  Patient agrees to Care Plan and Follow-up.  Plan: Telephone follow up appointment with care management team member scheduled for:  1 month  **

## 2021-10-23 NOTE — Patient Instructions (Signed)
Visit Information  Following are the goals we discussed today:   Track and Manage My Blood Sugars - DM   Timeframe:  Long-Range Goal Priority:  High Start Date:      06/25/21                       Expected End Date:      10/25/2022            Follow Up Date Jan 2023   - check blood sugar at prescribed times - check blood sugar if I feel it is too high or too low - take the blood sugar meter to all doctor visits   Why is this important?   Checking your blood sugar at home helps to keep it from getting very high or very low.  Writing the results in a diary or log helps the doctor know how to care for you.  Your blood sugar log should have the time, date and the results.  Also, write down the amount of insulin or other medicine that you take.  Other information, like what you ate, exercise done and how you were feeling, will also be helpful.    Plan: Telephone follow up appointment with care management team member scheduled for:  1 month The patient has been provided with contact information for the care management team and has been advised to call with any health related questions or concerns.   Tomasa Blase, PharmD Clinical Pharmacist, [facility]   Please call the care guide team at [phone] if you need to cancel or reschedule your appointment.   Patient verbalizes understanding of instructions provided today and agrees to view in [place].

## 2021-10-28 DIAGNOSIS — Z9689 Presence of other specified functional implants: Secondary | ICD-10-CM | POA: Diagnosis not present

## 2021-10-28 DIAGNOSIS — M542 Cervicalgia: Secondary | ICD-10-CM | POA: Diagnosis not present

## 2021-10-28 DIAGNOSIS — G894 Chronic pain syndrome: Secondary | ICD-10-CM | POA: Diagnosis not present

## 2021-10-28 DIAGNOSIS — M961 Postlaminectomy syndrome, not elsewhere classified: Secondary | ICD-10-CM | POA: Diagnosis not present

## 2021-10-29 ENCOUNTER — Other Ambulatory Visit: Payer: Self-pay | Admitting: Neurosurgery

## 2021-10-29 ENCOUNTER — Telehealth: Payer: Self-pay

## 2021-10-29 DIAGNOSIS — M542 Cervicalgia: Secondary | ICD-10-CM

## 2021-10-29 NOTE — Telephone Encounter (Signed)
Patient reports that she had to stop taking rybelsus due to nausea and vomiting that she was been having recently (2-3 episodes over the weekend)  Reports that she has started taking left over metformin $RemoveBefore'500mg'SJtldkuGLgSIV$  BID from her husbands prescription   Advised patient that due to her lastest eGFR being <20mL/min - she should not take metformin - patient to stop at this time   Patient to continue taking glipizide $RemoveBeforeDE'10mg'IHcrqPMJDzQKYqa$  with dinner - will monitor blood sugar at least once daily, should BG average >150 she will restart taking $RemoveBefore'5mg'MBwpKjtSaJmef$  of glipizide in the AM and $Remo'10mg'xnSGs$  in the PM  Patient notes that stomach has been feeling better since stopping rybelsus, will continue to monitor   Aware to reach out with any issues regarding medications / BG   Tomasa Blase, PharmD Clinical Pharmacist, [place]

## 2021-10-30 ENCOUNTER — Other Ambulatory Visit: Payer: Self-pay | Admitting: Internal Medicine

## 2021-10-31 ENCOUNTER — Ambulatory Visit (HOSPITAL_COMMUNITY): Payer: PPO | Attending: Internal Medicine

## 2021-10-31 ENCOUNTER — Other Ambulatory Visit: Payer: Self-pay

## 2021-10-31 DIAGNOSIS — R0602 Shortness of breath: Secondary | ICD-10-CM | POA: Diagnosis not present

## 2021-10-31 LAB — ECHOCARDIOGRAM COMPLETE
Area-P 1/2: 4.2 cm2
S' Lateral: 2.3 cm

## 2021-11-09 DIAGNOSIS — E782 Mixed hyperlipidemia: Secondary | ICD-10-CM

## 2021-11-09 DIAGNOSIS — E1159 Type 2 diabetes mellitus with other circulatory complications: Secondary | ICD-10-CM | POA: Diagnosis not present

## 2021-11-09 DIAGNOSIS — I10 Essential (primary) hypertension: Secondary | ICD-10-CM

## 2021-11-27 ENCOUNTER — Other Ambulatory Visit: Payer: Self-pay | Admitting: Neurosurgery

## 2021-11-27 ENCOUNTER — Ambulatory Visit
Admission: RE | Admit: 2021-11-27 | Discharge: 2021-11-27 | Disposition: A | Discharge status: Home / Self Care | Payer: PPO | Source: Ambulatory Visit | Attending: Neurosurgery | Admitting: Neurosurgery

## 2021-11-27 ENCOUNTER — Other Ambulatory Visit: Payer: Self-pay

## 2021-11-27 DIAGNOSIS — M542 Cervicalgia: Secondary | ICD-10-CM

## 2021-11-27 DIAGNOSIS — M4316 Spondylolisthesis, lumbar region: Secondary | ICD-10-CM | POA: Diagnosis not present

## 2021-11-27 DIAGNOSIS — M4722 Other spondylosis with radiculopathy, cervical region: Secondary | ICD-10-CM | POA: Diagnosis not present

## 2021-11-27 DIAGNOSIS — M545 Low back pain, unspecified: Secondary | ICD-10-CM

## 2021-11-27 DIAGNOSIS — M4802 Spinal stenosis, cervical region: Secondary | ICD-10-CM | POA: Diagnosis not present

## 2021-11-27 DIAGNOSIS — M5116 Intervertebral disc disorders with radiculopathy, lumbar region: Secondary | ICD-10-CM | POA: Diagnosis not present

## 2021-11-27 DIAGNOSIS — M48061 Spinal stenosis, lumbar region without neurogenic claudication: Secondary | ICD-10-CM | POA: Diagnosis not present

## 2021-11-27 MED ORDER — MEPERIDINE HCL 50 MG/ML IJ SOLN: 50.0000 mg | Freq: Once | INTRAMUSCULAR | Status: DC | PRN

## 2021-11-27 MED ORDER — DIAZEPAM 5 MG PO TABS
5.0000 mg | ORAL_TABLET | Freq: Once | ORAL | Status: AC
  Administered 2021-11-27: 5 mg via ORAL

## 2021-11-27 MED ORDER — IOPAMIDOL (ISOVUE-M 300) INJECTION 61%
10.0000 mL | Freq: Once | INTRAMUSCULAR | Status: AC
  Administered 2021-11-27: 10 mL via INTRATHECAL

## 2021-11-27 MED ORDER — ONDANSETRON HCL 4 MG/2ML IJ SOLN: 4.0000 mg | Freq: Once | INTRAMUSCULAR | Status: DC | PRN

## 2021-11-27 NOTE — Progress Notes (Signed)
Pt reports her spinal cord stimulator has been turned off for myelogram procedure.  

## 2021-11-27 NOTE — Discharge Instructions (Signed)

## 2021-11-28 ENCOUNTER — Other Ambulatory Visit: Payer: Self-pay | Admitting: Internal Medicine

## 2021-12-02 DIAGNOSIS — G894 Chronic pain syndrome: Secondary | ICD-10-CM | POA: Diagnosis not present

## 2021-12-02 DIAGNOSIS — M961 Postlaminectomy syndrome, not elsewhere classified: Secondary | ICD-10-CM | POA: Diagnosis not present

## 2021-12-02 DIAGNOSIS — Z9689 Presence of other specified functional implants: Secondary | ICD-10-CM | POA: Diagnosis not present

## 2021-12-02 DIAGNOSIS — M542 Cervicalgia: Secondary | ICD-10-CM | POA: Diagnosis not present

## 2021-12-03 ENCOUNTER — Ambulatory Visit (INDEPENDENT_AMBULATORY_CARE_PROVIDER_SITE_OTHER): Payer: PPO

## 2021-12-03 DIAGNOSIS — E782 Mixed hyperlipidemia: Secondary | ICD-10-CM

## 2021-12-03 DIAGNOSIS — E1159 Type 2 diabetes mellitus with other circulatory complications: Secondary | ICD-10-CM

## 2021-12-03 DIAGNOSIS — N184 Chronic kidney disease, stage 4 (severe): Secondary | ICD-10-CM

## 2021-12-03 DIAGNOSIS — I10 Essential (primary) hypertension: Secondary | ICD-10-CM

## 2021-12-03 NOTE — Patient Instructions (Signed)
Visit Information  Following are the goals we discussed today:   Monitor and Manage My Blood Sugars - DM   Timeframe:  Long-Range Goal Priority:  High Start Date:      06/25/21                       Expected End Date:      10/25/2022            Follow Up Date March 2023   - check blood sugar at prescribed times - check blood sugar if I feel it is too high or too low - take the blood sugar meter to all doctor visits   Why is this important?   Checking your blood sugar at home helps to keep it from getting very high or very low.  Writing the results in a diary or log helps the doctor know how to care for you.  Your blood sugar log should have the time, date and the results.  Also, write down the amount of insulin or other medicine that you take.  Other information, like what you ate, exercise done and how you were feeling, will also be helpful.      Plan: Telephone follow up appointment with care management team member scheduled for:  6 weeks  The patient has been provided with contact information for the care management team and has been advised to call with any health related questions or concerns.   Tomasa Blase, PharmD Clinical Pharmacist, [facility]   Please call the care guide team at [phone] if you need to cancel or reschedule your appointment.   Patient verbalizes understanding of instructions and care plan provided today and agrees to view in [place]. Active MyChart status confirmed with patient.

## 2021-12-03 NOTE — Progress Notes (Signed)
Chronic Care Management Pharmacy Note  12/03/2021 Name:  Sheila Melton MRN:  [id number] DOB:  [birth year]  Summary: -Patient reports that since stopping rybelsus - stomach issues have subsided - notes that appetite has increased and she is eating more  -BG in AM typically averaging 140-150 - in PM typically averaging 170-180 - has been elevated at times due to dietary indiscretions - taking glipizide after her meals  -Has been monitoring BP at home, last she could recall were measurements in the 120/60-70's  -Reports that she has not yet restarted ASA daily - forgets to take medication  Recommendations/Changes made from today's visit: -Recommended for patient to restart ASA $RemoveBef'81mg'[id number]$  daily, to also start taking glipizide prior to meals - as BG is in range in AM, believe that dose is appropriate - BG can appear elevated in PM due to taking glipizide after eating / sweet consumption - reviewed with patient carb counting and dietary recommendations  -Mailed kerendia PAP to patient - will complete patient portion and give to nephrologist to complete the application     Subjective: Sheila Melton is an 81 y.o. year old female who is a primary patient of Burns, Claudina Lick, MD.  The CCM team was consulted for assistance with disease management and care coordination needs.    Engaged with patient by telephone for follow up visit in response to provider referral for pharmacy case management and/or care coordination services.   Consent to Services:  The patient was given information about Chronic Care Management services, agreed to services, and gave verbal consent prior to initiation of services.  Please see initial visit note for detailed documentation.   Patient Care Team: Sheila Rail, MD as PCP - General (Internal Medicine) Wellington Hampshire, MD as PCP - Cardiology (Cardiology) Elmarie Shiley, MD as Consulting Physician (Nephrology)  Recent office visits: No changes since last visit   Recent consult  visits: 12/02/2020 - Pain management - notes not available   Hospital visits: None in previous 6 months  Objective:  Lab Results  Component Value Date   CREATININE 2.59 (H) 08/21/2021   BUN 38 (H) 08/21/2021   GFR 17.21 (L) 08/21/2021   GFRNONAA 46 (L) 02/19/2017   GFRAA 53 (L) 02/19/2017   NA 139 08/21/2021   K 3.9 08/21/2021   CALCIUM 9.3 08/21/2021   CO2 25 08/21/2021   GLUCOSE 177 (H) 08/21/2021    Lab Results  Component Value Date/Time   HGBA1C 7.5 (H) 08/21/2021 02:13 PM   HGBA1C 7.5 (H) 05/21/2021 02:24 PM   FRUCTOSAMINE 332 (H) 04/25/2015 12:52 PM   GFR 17.21 (L) 08/21/2021 02:13 PM   GFR 17.81 (L) 05/21/2021 02:24 PM   MICROALBUR 194.0 Verified by manual dilution. (H) 11/16/2014 12:05 PM   MICROALBUR 95.5 (H) 05/09/2014 11:28 AM    Last diabetic Eye exam:  Lab Results  Component Value Date/Time   HMDIABEYEEXA No Retinopathy 10/29/2020 12:00 AM    Last diabetic Foot exam: No results found for: HMDIABFOOTEX   Lab Results  Component Value Date   CHOL 153 08/21/2021   HDL 40.40 08/21/2021   LDLCALC 70 05/21/2020   LDLDIRECT 59.0 08/21/2021   TRIG 342.0 (H) 08/21/2021   CHOLHDL 4 08/21/2021    Hepatic Function Latest Ref Rng & Units 08/21/2021 05/21/2021 11/21/2020  Total Protein 6.0 - 8.3 g/dL 7.0 7.0 7.3  Albumin 3.5 - 5.2 g/dL 4.1 4.1 4.2  AST 0 - 37 U/L $Remo'18 17 16  'txFkp$ ALT 0 - 35  U/L '13 12 12  '$ Alk Phosphatase 39 - 117 U/L 75 71 100  Total Bilirubin 0.2 - 1.2 mg/dL 0.3 0.3 0.3  Bilirubin, Direct 0.0 - 0.3 mg/dL - - -    Lab Results  Component Value Date/Time   TSH 3.01 11/21/2020 03:13 PM   TSH 2.95 11/17/2018 03:03 PM   FREET4 0.78 11/27/2011 01:53 PM   FREET4 0.8 12/31/2007 08:13 AM    CBC Latest Ref Rng & Units 08/21/2021 11/21/2020 05/21/2020  WBC 4.0 - 10.5 K/uL 11.3(H) 12.6(H) 11.2(H)  Hemoglobin 12.0 - 15.0 g/dL 10.7(L) 11.6(L) 10.8(L)  Hematocrit 36.0 - 46.0 % 32.5(L) 34.7(L) 32.6(L)  Platelets 150.0 - 400.0 K/uL 328.0 350.0 318    Lab  Results  Component Value Date/Time   VD25OH 32 09/07/2020 04:19 PM   VD25OH 87 10/11/2010 08:57 PM    Clinical ASCVD: Yes  The 10-year ASCVD risk score (Arnett DK, et al., 2019) is: 43%   Values used to calculate the score:     Age: 75 years     Sex: Female     Is Non-Hispanic African American: No     Diabetic: Yes     Tobacco smoker: Yes     Systolic Blood Pressure: 852 mmHg     Is BP treated: Yes     HDL Cholesterol: 40.4 mg/dL     Total Cholesterol: 153 mg/dL    Depression screen [facility] 2/9 07/10/2021 09/14/2020 09/07/2020  Decreased Interest 1 0 0  Down, Depressed, Hopeless 1 0 0  PHQ - 2 Score 2 0 0  Altered sleeping 0 - -  Tired, decreased energy 1 - -  Change in appetite 0 - -  Feeling bad or failure about yourself  0 - -  Trouble concentrating 1 - -  Moving slowly or fidgety/restless 0 - -  Suicidal thoughts 0 - -  PHQ-9 Score 4 - -  Difficult doing work/chores Not difficult at all - -  Some recent data might be hidden     Social History   Tobacco Use  Smoking Status Light Smoker   Packs/day: 1.00   Years: 50.00   Pack years: 50.00   Types: E-cigarettes, Cigarettes  Smokeless Tobacco Never  Tobacco Comments   e-cigs   BP Readings from Last 3 Encounters:  11/27/21 (!) 172/70  10/15/21 112/60  08/21/21 108/72   Pulse Readings from Last 3 Encounters:  11/27/21 72  10/15/21 95  08/21/21 95   Wt Readings from Last 3 Encounters:  10/15/21 155 lb (70.3 kg)  08/21/21 158 lb (71.7 kg)  07/10/21 155 lb 9.6 oz (70.6 kg)   BMI Readings from Last 3 Encounters:  10/15/21 26.61 kg/m  08/21/21 27.12 kg/m  07/10/21 26.71 kg/m    Assessment/Interventions: Review of patient past medical history, allergies, medications, health status, including review of consultants reports, laboratory and other test data, was performed as part of comprehensive evaluation and provision of chronic care management services.   SDOH:  (Social Determinants of Health) assessments and  interventions performed: Yes  SDOH Screenings   Alcohol Screen: Not on file  Depression (PHQ2-9): Low Risk    PHQ-2 Score: 4  Financial Resource Strain: Not on file  Food Insecurity: Not on file  Housing: Not on file  Physical Activity: Not on file  Social Connections: Not on file  Stress: Not on file  Tobacco Use: High Risk   Smoking Tobacco Use: Light Smoker   Smokeless Tobacco Use: Never   Passive Exposure: Not  on file  Transportation Needs: Not on file    CCM Care Plan  Allergies  Allergen Reactions   Rocephin [Ceftriaxone Sodium In Dextrose] Dermatitis and Rash    10/24 /14 blisters of palms & diffuse rash Because of a history of documented adverse serious drug reaction;Medi Alert bracelet  is recommended   Amoxicillin Rash    Has patient had a PCN reaction causing IMMEDIATE RASH, FACIAL/TONGUE/THROAT SWELLING, SOB, OR LIGHTHEADEDNESS WITH HYPOTENSION:  #  #  #  YES  #  #  #  Has patient had a PCN reaction causing severe rash involving mucus membranes or skin necrosis: No Has patient had a PCN reaction that required hospitalization No Has patient had a PCN reaction occurring within the last 10 years: No If all of the above answers are "NO", then may proceed with Cephalosporin use.    Captopril Cough   Simvastatin Other (See Comments)    MYALGIAS BUTTOCKS CRAMP   Ciprofloxacin Other (See Comments)    Sores in mouth    Semaglutide Nausea And Vomiting   Adhesive [Tape] Rash   Latex Rash    Patient states she gets red and a rash when latex touches her.    Medications Reviewed Today     Reviewed by Tomasa Blase, [facility] (Pharmacist) on 12/03/21 at 1041  Med List Status: <None>   Medication Order Taking? Sig Documenting Provider Last Dose Status Informant  amLODipine (NORVASC) 5 MG tablet [id number] Yes Take 1 tablet (5 mg total) by mouth daily. TAKE ONE TABLET BY MOUTH TWICE DAILY Wellington Hampshire, MD Taking Active   ascorbic Acid (VITAMIN C) 500 MG CPCR [id number]  Yes 1 cap PO QD [provider] Taking Active   aspirin 81 MG chewable tablet [id number] No 1 tablet  Patient not taking: Reported on 10/23/2021   [provider] Not Taking Active   augmented betamethasone dipropionate (DIPROLENE-AF) 0.05 % cream [id number] No APPLY 1 application Externally twice a day FOR 14 days  Patient not taking: Reported on 10/23/2021   [provider] Not Taking Active   B Complex-C (SUPER B COMPLEX PO) [id number] Yes Take 1 tablet by mouth daily.  [provider] Taking Active Self  Blood Glucose Monitoring Suppl (ONE TOUCH ULTRA 2) w/Device KIT [id number] Yes Use as advised Philemon Kingdom, MD Taking Active Self  Cholecalciferol (VITAMIN D3) 125 MCG (5000 UT) TABS [id number] Yes Take 5,000 Units by mouth daily. [provider] Taking Active Self  diazepam (VALIUM) 5 MG tablet [id number] Yes TAKE ONE TABLET BY MOUTH AT BEDTIME AS NEEDED. Sheila Rail, MD Taking Active   fexofenadine [facility]) 180 MG tablet [id number] Yes Take 180 mg by mouth at bedtime. [provider] Taking Active Self  FLUoxetine (PROZAC) 10 MG capsule [id number] Yes TAKE ONE CAPSULE BY MOUTH DAILY Burns, Claudina Lick, MD Taking Active   furosemide (LASIX) 40 MG tablet [id number] Yes TAKE ONE-HALF TO ONE TABLET BY MOUTH DAILY AS NEEDED FOR IN FLUID. Sheila Rail, MD Taking Active   glipiZIDE (GLUCOTROL) 5 MG tablet [id number] Yes TAKE ONE TABLET BY MOUTH IN THE MORNING AND TAKE TWO TABLETS IN THE Gennaro Africa, MD Taking Active   KERENDIA 10 MG TABS [id number] Yes Take 1 tablet by mouth daily. [provider] Taking Active   labetalol (NORMODYNE) 300 MG tablet [id number] Yes Take 1 tablet (300 mg total) by mouth 3 (three) times daily.  Patient taking differently: Take 300 mg by mouth 2 (two) times  daily.   Sheila Rail, MD Taking Active   Liniments [facility] PAIN RELIEF PATCH EX) [id number] Yes Apply 1 patch topically daily as needed (back  pain). [provider] Taking Active Self  losartan (COZAAR) 100 MG tablet [id number] Yes TAKE ONE TABLET BY MOUTH DAILY Quay Burow, Claudina Lick, MD Taking Active   [address] [employer] ULTRA test strip [id number] Yes CHECK BLOOD SUGAR TWICE DAILY. Sheila Rail, MD Taking Active   oxyCODONE-acetaminophen (PERCOCET/ROXICET) 5-325 MG tablet [id number] Yes Take 1 tablet by mouth 3 (three) times daily as needed. [provider] Taking Active   PREVIDENT 5000 DRY MOUTH 1.1 % GEL dental gel [id number] Yes Place onto teeth. [provider] Taking Active   rosuvastatin (CRESTOR) 20 MG tablet [id number] Yes TAKE ONE TABLET BY MOUTH DAILY Burns, Claudina Lick, MD Taking Active   Triamcinolone Acetonide (NASACORT ALLERGY 24HR NA) [id number] Yes Place 1 spray into the nose 2 (two) times daily as needed (congestion). [provider] Taking Active Self  vitamin B-12 (CYANOCOBALAMIN) 1000 MCG tablet [id number] Yes Take 1,000 mcg by mouth daily. [provider] Taking Active Self  Vitamin E 180 MG CAPS [id number] Yes Take 180 mg by mouth daily. 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) 07/26/2012    Degenerative spondylolisthesis 07/26/2012   SHOULDER PAIN, LEFT, CHRONIC 07/25/2009   CARDIAC MURMUR, SYSTOLIC 19/41/7408   Mixed hyperlipidemia 12/30/2007   Tobacco use disorder 12/30/2007   Essential hypertension 12/30/2007   Subclavian steal syndrome 12/30/2007   CAROTID BRUIT 04/07/2007    Immunization History  Administered Date(s) Administered   Fluad Quad(high Dose 65+) 08/24/2019, 08/21/2020, 08/21/2021   Influenza Split 08/28/2011, 09/07/2012   Influenza Whole 09/06/2008, 08/30/2009, 10/11/2010   Influenza, High Dose Seasonal PF 08/24/2013, 08/18/2014, 08/07/2015, 08/08/2016, 07/31/2017, 08/10/2018   PFIZER(Purple Top)SARS-COV-2 Vaccination 11/29/2019, 12/19/2019, 11/16/2020   Pneumococcal Conjugate-13 09/26/2015   Pneumococcal Polysaccharide-23 12/10/2009    Conditions to be addressed/monitored:  Hypertension, Hyperlipidemia, Diabetes, Coronary Artery Disease, and Chronic Kidney Disease  Care Plan : [place]  Updates made by Tomasa Blase, RPH since 12/03/2021 12:00 AM     Problem: Hypertension, Hyperlipidemia, Diabetes, Coronary Artery Disease, and Chronic Kidney Disease   Priority: High     Long-Range Goal: Disease management   Start Date: 06/25/2021  Expected End Date: 10/23/2022  This Visit's Progress: Not on track  Recent Progress: Not on track  Priority: High  Note:   Current Barriers:  Unable to independently monitor therapeutic efficacy Unable to maintain control of diabetes  Pharmacist Clinical Goal(s):  Patient will achieve adherence to monitoring guidelines and medication adherence to achieve therapeutic efficacy adhere to plan to optimize therapeutic regimen for Diabetes as evidenced  by report of adherence to recommended medication management changes through collaboration with PharmD and provider.   Interventions: 1:1 collaboration with Sheila Rail, MD regarding development and update of comprehensive plan of care as evidenced by  provider attestation and co-signature Inter-disciplinary care team collaboration (see longitudinal plan of care) Comprehensive medication review performed; medication list updated in electronic medical record  Hyperlipidemia: (LDL goal < 70) -Not ideally controlled - LDL is at goal, triglycerides are elevated; pt has been stable on rosuvastatin 20 mg for ~4 years, GFR has steadily declined over that time (from ~50 to 17 ml/min) Lab Results  Component Value Date   [place] 70 05/21/2020  -Current treatment: Rosuvastatin 20 mg daily -Pt may benefit from changing rosuvastatin; atorvastatin is preferred high-intensity statin with GFR < 30 -Consider changing rosuvastatin to atorvastatin at follow up  Hypertension (BP goal <140/90) - Controlled  -Current treatment: Amlodipine 5mg  daily  Labetolol 300mg  - 3 times daily - patient reports to taking 2 times daily  Losartan 100mg  daily  Furosemide 40mg  - 1 tablet daily as needed for swelling  -Medications previously tried: Spironolactone, Valsartan  -Current home readings: reports that at home typically is averaging 118/70 with HR of 80-90bpm -Current dietary habits: reports to low sodium diet -Current exercise habits: Limited due to chronic back pain  -Reports hypotensive/hypertensive symptoms - notes that dizziness has decreased since amlodipine was decreased - no issues with syncope since decrease  -Educated on BP goals and benefits of medications for prevention of heart attack, stroke and kidney damage; Daily salt intake goal < 2300 mg; Importance of home blood pressure monitoring; Proper BP monitoring technique; Symptoms of hypotension and importance of maintaining adequate hydration; -Counseled to monitor BP at home 3 times weekly, document, and provide log at future appointments -Counseled on diet and exercise extensively Recommended to continue current medication  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%) -Not ideally controlled - home BG  in AM at goal - averaging 140-150 - typically 2 hours post prandial averaging 170-180 - but can be elevated - reports to reading in 300 x 3 (admits to eating more sweets / more food on these days) Lab Results  Component Value Date   HGBA1C 7.5 (H) 08/21/2021  -Current home glucose readings: AM can average 140-150, PM typically 170-180 -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, 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, tacrolimus -Current medications: Glipizide 5 mg - 2 tab at dinner Kerendia 10 mg daily (prescribed by nephrology) -Medications previously tried: glipizide, metformin, Januvia, pioglitazone -Counseled to continue glipizide for now; advised to monitor BG twice a day;  -Recommend to continue current medication - advised for patient to move glipizide administration prior to meals - will prevent the large fluctuations in BG by taking after  - also reviewed with patient carb counting / dietary suggestions to prevent BG increases   Back pain (Goal: manage pain) -Not ideally controlled - pt reports she is in significant pain very day, which prevents her from being [place]; she sees pain mgmt regularly -Pt has spine stimulator implanted - pt reports it has not helped much -Current treatment  Oxycodone-APAP 5-325 mg #60 per month -Counseled that pain and stress can increase BG; advised to take oxycodone as directed -Recommended to continue current medication  Patient Goals/Self-Care Activities Patient will:  - take medications as prescribed -focus on medication adherence by pill box -check glucose AM and bedtime -collaborate with provider on medication access solutions [place] Clamp)       Medication Assistance:  Rybelsus -  approved through 11/09/21; renewal in process 2023  Compliance/Adherence/Medication fill history: Care Gaps: Hep C screening  Star-Rating Drugs: Rybelsus - [facility] Rosuvastatin - LF 07/01/21 x 90 ds Losartan - LF 07/01/21 x 90 ds Glipizide - LF 03/19/21 x 90 ds (d/c'd for a few months then  restarted Fall 2022)  Patient's preferred pharmacy is:  [facility], [address] [place] [address] Phone: [phone] Fax: [place], [place] [address] Phone: [phone] Fax: [phone]   Uses pill box? Yes Pt endorses 100% compliance  We discussed: Current pharmacy is preferred with insurance plan and patient is satisfied with pharmacy services Patient decided to: Continue current medication management strategy  Care Plan and Follow Up Patient Decision:  Patient agrees to Care Plan and Follow-up.  Plan: Telephone follow up appointment with care management team member scheduled for:  1 month  Tomasa Blase, PharmD Clinical Pharmacist, [place]

## 2021-12-10 DIAGNOSIS — E1159 Type 2 diabetes mellitus with other circulatory complications: Secondary | ICD-10-CM

## 2021-12-10 DIAGNOSIS — N184 Chronic kidney disease, stage 4 (severe): Secondary | ICD-10-CM | POA: Diagnosis not present

## 2021-12-10 DIAGNOSIS — E782 Mixed hyperlipidemia: Secondary | ICD-10-CM

## 2021-12-10 DIAGNOSIS — I10 Essential (primary) hypertension: Secondary | ICD-10-CM | POA: Diagnosis not present

## 2021-12-15 ENCOUNTER — Other Ambulatory Visit: Payer: Self-pay | Admitting: Internal Medicine

## 2021-12-17 DIAGNOSIS — N184 Chronic kidney disease, stage 4 (severe): Secondary | ICD-10-CM | POA: Diagnosis not present

## 2021-12-23 NOTE — Progress Notes (Signed)
Subjective:    Patient ID: Sheila Melton, female    DOB: 23-Oct-1941, 81 y.o.   MRN: [id number]  This visit occurred during the SARS-CoV-2 public health emergency.  Safety protocols were in place, including screening questions prior to the visit, additional usage of staff PPE, and extensive cleaning of exam room while observing appropriate contact time as indicated for disinfecting solutions.     HPI The patient is here for follow up of their chronic medical problems, including DM, htn, hld, CKD, anxiety, depression, chronic back pain, sleep difficulties  She not taking rybelsus - after increasing to the higher dose she had dizziness, nausea and passed out.   Did ok with lower doses. Her sugars have been very high - in 200's, occ 300's.  She has taken her husbands metformin on occasion when he sugars were very elevated.    Not feeling well.  Started in January.  Since taking rybelsus - she has had a sore throat that is intermittent, hoarseness, cough.  She was experiencing gerd, but that is getting better.    Medications and allergies reviewed with patient and updated if appropriate.  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   CKD (chronic kidney disease) 05/21/2016   Sleep difficulties 01/18/2016   Diabetes mellitus ([place]) 10/23/2015   Numbness of toes-Right foot 06/26/2015   Colitis 03/19/2015   Nephrolithiasis 03/19/2015   AAA (abdominal aortic aneurysm) without rupture ([place]) 03/19/2015   Anxiety state 03/19/2015   Skin cancer 01/09/2015   Former smoker 05/24/2014   Bilateral renal cysts 09/16/2012   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      Blood Glucose Monitoring Suppl (ONE TOUCH ULTRA 2) w/Device KIT Use as advised 1 each 0   Cholecalciferol (VITAMIN D3) 125 MCG (5000 UT) TABS Take 5,000 Units by mouth daily.     diazepam (VALIUM) 5 MG tablet TAKE ONE TABLET BY MOUTH AT BEDTIME AS NEEDED. 30 tablet 2   fexofenadine (ALLEGRA) 180 MG tablet Take 180 mg by mouth at bedtime.     FLUoxetine (PROZAC) 10 MG capsule TAKE ONE CAPSULE BY MOUTH DAILY 90 capsule 2   furosemide (LASIX) 40 MG tablet TAKE ONE-HALF TO ONE TABLET BY MOUTH DAILY AS NEEDED FOR IN FLUID. 90 tablet 0   glipiZIDE (GLUCOTROL) 5 MG tablet TAKE ONE TABLET BY MOUTH IN THE MORNING AND TAKE TWO TABLETS IN THE EVENING 270 tablet 0   KERENDIA 10 MG TABS Take 1 tablet by mouth daily.     Liniments (SALONPAS PAIN RELIEF PATCH EX) Apply 1 patch topically daily as needed (back pain).     losartan (COZAAR) 100 MG tablet TAKE  ONE TABLET BY MOUTH DAILY 90 tablet 0   ONETOUCH ULTRA test strip CHECK BLOOD SUGAR TWICE DAILY. 100 strip 0   oxyCODONE-acetaminophen (PERCOCET/ROXICET) 5-325 MG tablet Take 1 tablet by mouth 3 (three) times daily as needed.     PREVIDENT 5000 DRY MOUTH 1.1 % GEL dental gel Place onto teeth.     rosuvastatin (CRESTOR) 20 MG tablet TAKE ONE TABLET BY MOUTH DAILY 90 tablet 0   Triamcinolone Acetonide (NASACORT ALLERGY 24HR NA) Place 1 spray into the nose 2 (two) times daily as needed (congestion).     vitamin B-12 (CYANOCOBALAMIN) 1000 MCG tablet Take 1,000  mcg by mouth daily.     Vitamin E 180 MG CAPS Take 180 mg by mouth daily.     No current facility-administered medications on file prior to visit.    Past Medical History:  Diagnosis Date   AAA (abdominal aortic aneurysm)    BEING WATCHED   VVS. 4.2 cm infrarenal abdominal aortic aneurysm without rupture noted 11/08/2016   Arthritis    Cancer (HCC)    skin cancer removed from left shoulder  2017   CAP (community acquired pneumonia) 09/02/13-11/05/13    with SIRS   Depression    ?? from prednisone    not on pred now   Diabetes mellitus    type ii    long time ago.   Family history of anesthesia complication    PATIENTS MOM HAD TROUBLE WAKING UP    GERD (gastroesophageal reflux disease)    will take occasional tums   Heart murmur    still has.   Sees Dr. Jens Som   History of kidney stones    has them at the present time   Hyperlipidemia    Hypertension    unspecified essential   Renal insufficiency    "FSGS"   Subclavian steal syndrome    LEFT SIDE   NO SIDE EFFECTS   Subclavian steal syndrome --  left    85% blocked   UTI (lower urinary tract infection) 08/26/13   Enterococcus and Escherichia coli both sensitive to nitrofurantoin    Past Surgical History:  Procedure Laterality Date   ABDOMINAL HYSTERECTOMY     BACK SURGERY     CATARACT EXTRACTION     OS   CYST EXCISION Right 05-23-13   Right index finger: cyst   DILATION AND CURETTAGE OF UTERUS     EYE SURGERY     g1 p1     SPINE SURGERY  Sept. 2013   VIDEO ASSISTED THORACOSCOPY (VATS)/ LOBECTOMY Right 02/16/2017   Procedure: Right VIDEO ASSISTED THORACOSCOPY (VATS)/ Right Middle LOBECTOMY;  Surgeon: Loreli Slot, MD;  Location: [facility] OR;  Service: Thoracic;  Laterality: Right;    Social History   Socioeconomic History   Marital status: Married    Spouse name: Not on file   Number of children: 1   Years of education: Not on file   Highest education level: Not on file  Occupational History   Not on  file  Tobacco Use   Smoking status: Light Smoker    Packs/day: 1.00    Years: 50.00    Pack years: 50.00    Types: E-cigarettes, Cigarettes   Smokeless tobacco: Never   Tobacco comments:    e-cigs  Vaping Use   Vaping Use: Never used  Substance and Sexual Activity   Alcohol use: No   Drug use: No   Sexual activity: Not on file  Other Topics Concern  Not on file  Social History Narrative   Has 3 caffeinated bev a day      Exercise: none   Social Determinants of Health   Financial Resource Strain: Not on file  Food Insecurity: Not on file  Transportation Needs: Not on file  Physical Activity: Not on file  Stress: Not on file  Social Connections: Not on file    Family History  Problem Relation Age of Onset   Heart failure Mother    Heart disease Mother    Hypertension Mother    Other Mother        varicose veins   Deep vein thrombosis Mother    Varicose Veins Mother    Heart attack Mother    Hypertension Sister        X 2   Diabetes Sister    Hyperlipidemia Sister    Other Sister        varicose veins   Heart disease Father    Diabetes Father    Hyperlipidemia Father    Hypertension Father    Heart attack Father    Heart disease Brother        MI in late 39s   Hyperlipidemia Brother    Heart attack Brother    Coronary artery disease Other    Diabetes Other    Parkinsonism Brother    Heart failure Brother    Hypertension Daughter     Review of Systems  Constitutional:  Positive for chills. Negative for fever.  HENT:  Positive for congestion, sore throat (intermittent) and voice change. Negative for sinus pain.   Respiratory:  Positive for cough (dry). Negative for shortness of breath and wheezing.   Cardiovascular:  Negative for chest pain, palpitations and leg swelling.  Gastrointestinal:  Positive for abdominal pain (intermittent). Negative for nausea.       Gerd  Neurological:  Positive for dizziness (occ). Negative for light-headedness and  headaches.      Objective:   Vitals:   12/25/21 1259  BP: 116/68  Pulse: 67  Temp: 98 F (36.7 C)  SpO2: 98%   BP Readings from Last 3 Encounters:  12/25/21 116/68  11/27/21 (!) 172/70  10/15/21 112/60   Wt Readings from Last 3 Encounters:  12/25/21 158 lb 3.2 oz (71.8 kg)  10/15/21 155 lb (70.3 kg)  08/21/21 158 lb (71.7 kg)   Body mass index is 27.15 kg/m.   Physical Exam    Constitutional: Appears well-developed and well-nourished. No distress.  HENT:  Head: Normocephalic and atraumatic.  Neck: Neck supple. No tracheal deviation present. No thyromegaly present.  No cervical lymphadenopathy Cardiovascular: Normal rate, regular rhythm and normal heart sounds.  No murmur heard. No carotid bruit .  No edema Pulmonary/Chest: Effort normal and breath sounds normal. No respiratory distress. No has no wheezes. No rales.  Skin: Skin is warm and dry. Not diaphoretic.  Psychiatric: Normal mood and affect. Behavior is normal.      Assessment & Plan:    See Problem List for Assessment and Plan of chronic medical problems.

## 2021-12-23 NOTE — Patient Instructions (Addendum)
° ° °  Your a1c was checked.    Medications changes include :   start rybelsus 3 mg daily - samples    Please followup in 6 months

## 2021-12-24 ENCOUNTER — Ambulatory Visit: Payer: PPO | Admitting: Internal Medicine

## 2021-12-24 DIAGNOSIS — I129 Hypertensive chronic kidney disease with stage 1 through stage 4 chronic kidney disease, or unspecified chronic kidney disease: Secondary | ICD-10-CM | POA: Diagnosis not present

## 2021-12-24 DIAGNOSIS — N2581 Secondary hyperparathyroidism of renal origin: Secondary | ICD-10-CM | POA: Diagnosis not present

## 2021-12-24 DIAGNOSIS — D631 Anemia in chronic kidney disease: Secondary | ICD-10-CM | POA: Diagnosis not present

## 2021-12-24 DIAGNOSIS — N184 Chronic kidney disease, stage 4 (severe): Secondary | ICD-10-CM | POA: Diagnosis not present

## 2021-12-25 ENCOUNTER — Encounter: Payer: Self-pay | Admitting: Internal Medicine

## 2021-12-25 ENCOUNTER — Other Ambulatory Visit: Payer: Self-pay

## 2021-12-25 ENCOUNTER — Ambulatory Visit (INDEPENDENT_AMBULATORY_CARE_PROVIDER_SITE_OTHER): Payer: PPO | Admitting: Internal Medicine

## 2021-12-25 VITALS — BP 116/68 | HR 67 | Temp 98.0°F | Ht 64.0 in | Wt 158.2 lb

## 2021-12-25 DIAGNOSIS — G9519 Other vascular myelopathies: Secondary | ICD-10-CM | POA: Diagnosis not present

## 2021-12-25 DIAGNOSIS — G479 Sleep disorder, unspecified: Secondary | ICD-10-CM | POA: Diagnosis not present

## 2021-12-25 DIAGNOSIS — M4807 Spinal stenosis, lumbosacral region: Secondary | ICD-10-CM

## 2021-12-25 DIAGNOSIS — F411 Generalized anxiety disorder: Secondary | ICD-10-CM

## 2021-12-25 DIAGNOSIS — I10 Essential (primary) hypertension: Secondary | ICD-10-CM

## 2021-12-25 DIAGNOSIS — F3289 Other specified depressive episodes: Secondary | ICD-10-CM | POA: Diagnosis not present

## 2021-12-25 DIAGNOSIS — E782 Mixed hyperlipidemia: Secondary | ICD-10-CM | POA: Diagnosis not present

## 2021-12-25 DIAGNOSIS — N184 Chronic kidney disease, stage 4 (severe): Secondary | ICD-10-CM

## 2021-12-25 DIAGNOSIS — E1159 Type 2 diabetes mellitus with other circulatory complications: Secondary | ICD-10-CM

## 2021-12-25 LAB — POCT GLYCOSYLATED HEMOGLOBIN (HGB A1C)
HbA1c POC (<> result, manual entry): 7.3 % (ref 4.0–5.6)
HbA1c, POC (controlled diabetic range): 7 % (ref 0.0–7.0)
HbA1c, POC (prediabetic range): 7.3 % — AB (ref 5.7–6.4)
Hemoglobin A1C: 7.3 % — AB (ref 4.0–5.6)

## 2021-12-25 MED ORDER — LABETALOL HCL 300 MG PO TABS: 300.0000 mg | ORAL_TABLET | Freq: Two times a day (BID) | ORAL | Status: AC

## 2021-12-25 MED ORDER — AMLODIPINE BESYLATE 5 MG PO TABS: 5.0000 mg | ORAL_TABLET | Freq: Every day | ORAL | 3 refills | Status: DC

## 2021-12-25 NOTE — Assessment & Plan Note (Signed)
Chronic Unable to have surgery Taking oxycodone 5-325 mg 1 tab 3 times daily as needed

## 2021-12-25 NOTE — Assessment & Plan Note (Addendum)
Chronic Lab Results  Component Value Date   HGBA1C 7.5 (H) 08/21/2021   Sugars very high at home Continue glipizide 5 mg in the morning and 10 mg in the evening No longer taking rybelsus - dizziness, nausea, passed out Check A1c - 7.3% Restart rybelsus 3 mg daily - she was doing ok on lower doses of rybelsus so she may be able to tolerate it at 3 or 7 mg

## 2021-12-25 NOTE — Assessment & Plan Note (Addendum)
CrestorChronic Regular exercise and healthy diet encouraged Continue rosuvastatin 20 mg daily

## 2021-12-25 NOTE — Assessment & Plan Note (Signed)
Chronic Controlled, Stable Continue fluoxetine 10 mg daily, Valium 5 mg nightly as needed

## 2021-12-25 NOTE — Assessment & Plan Note (Signed)
Chronic Controlled, Stable Continue fluoxetine 10 mg daily 

## 2021-12-25 NOTE — Assessment & Plan Note (Signed)
Chronic Following with nephrology-Dr. Posey Pronto Secondary to focal segmental glomerulosclerosis On kerendia CMP

## 2021-12-25 NOTE — Addendum Note (Signed)
Addended by: Marcina Millard on: 12/25/2021 02:05 PM   Modules accepted: Orders

## 2021-12-25 NOTE — Assessment & Plan Note (Addendum)
Chronic Blood pressure well controlled CMP Continue amlodipine 5 mg daily, labetalol 300 mg 2 times daily, losartan 100 mg daily

## 2021-12-25 NOTE — Assessment & Plan Note (Signed)
Chronic Controlled, Stable Continue diazepam 5 mg nightly

## 2021-12-26 ENCOUNTER — Telehealth: Payer: Self-pay | Admitting: Internal Medicine

## 2021-12-26 MED ORDER — TRULICITY 0.75 MG/0.5ML ~~LOC~~ SOAJ: 0.7500 mg | SUBCUTANEOUS | 0 refills | Status: DC

## 2021-12-26 NOTE — Telephone Encounter (Signed)
Spoke with patient today.  She will call me back and let me know if medication is not covered or to expensive.

## 2021-12-26 NOTE — Telephone Encounter (Signed)
Please call her and let her know we will get a try to see if Trulicity-the diabetic medication that is an injection once a week is covered by her insurance.  If not Quillian Quince will see if we can get it for reduced price.  This would replace the Rybelsus.  This medication typically works well and hopefully she will not have any nausea, vomiting or GI symptoms with it.  If it is approved and she starts that she will stop the Rybelsus.  We start on the lowest dose and increase after 1 month if she tolerates it well.  I sent a prescription to [facility].

## 2021-12-30 ENCOUNTER — Telehealth: Payer: Self-pay

## 2021-12-30 ENCOUNTER — Other Ambulatory Visit: Payer: Self-pay

## 2021-12-30 MED ORDER — GLIPIZIDE 5 MG PO TABS: ORAL_TABLET | ORAL | 0 refills | Status: DC

## 2021-12-30 NOTE — Telephone Encounter (Signed)
Pt is starting Dulaglutide (TRULICITY) 2.12 YQ/8.2NO SOPN today. Pt is wanting to make sure she is to continue glipiZIDE (GLUCOTROL) 5 MG tablet.  Pt states that she didn't received a new Rx for the glipiZIDE (GLUCOTROL) 5 MG tablet and only have about 5 days remaining.  Please advise Pt CB [phone]  Pharmacy:  [facility], [address]  LOV 12/25/21

## 2021-12-30 NOTE — Telephone Encounter (Signed)
Reordered today

## 2021-12-31 ENCOUNTER — Other Ambulatory Visit: Payer: Self-pay

## 2021-12-31 DIAGNOSIS — I714 Abdominal aortic aneurysm, without rupture, unspecified: Secondary | ICD-10-CM

## 2022-01-03 ENCOUNTER — Telehealth: Payer: Self-pay | Admitting: Internal Medicine

## 2022-01-03 NOTE — Telephone Encounter (Signed)
Rep w/ [employer] is requesting PA for Dulaglutide (TRULICITY) 3.83 FX/8.3AN SOPN  Rep states a PA is needed for the quantity

## 2022-01-07 ENCOUNTER — Other Ambulatory Visit: Payer: Self-pay | Admitting: Internal Medicine

## 2022-01-09 DIAGNOSIS — E119 Type 2 diabetes mellitus without complications: Secondary | ICD-10-CM | POA: Diagnosis not present

## 2022-01-09 DIAGNOSIS — H40013 Open angle with borderline findings, low risk, bilateral: Secondary | ICD-10-CM | POA: Diagnosis not present

## 2022-01-09 LAB — HM DIABETES EYE EXAM

## 2022-01-09 MED ORDER — TRULICITY 1.5 MG/0.5ML ~~LOC~~ SOAJ: 1.5000 mg | SUBCUTANEOUS | 3 refills | Status: DC

## 2022-01-09 NOTE — Telephone Encounter (Signed)
Mandalyn Zoss Key: [id number] help? Call us at [phone] ? ?Outcome: ?Additional Information Required ?Prior Authorization not required for patient/medication ? ?Spoke with pharmacy today and patient was able to pick up meds. Copay was $45.00. Picked up on 12/23/21. ?

## 2022-01-09 NOTE — Addendum Note (Signed)
Addended by: Marcina Millard on: 01/09/2022 03:47 PM ? ? Modules accepted: Orders ? ?

## 2022-01-09 NOTE — Addendum Note (Signed)
Addended by: Binnie Rail on: 01/09/2022 03:42 PM ? ? Modules accepted: Orders ? ?

## 2022-01-09 NOTE — Telephone Encounter (Signed)
Lets increase dose to 1.5 mg  -- Sheila Melton can help with anything we need as well - he knows her. ? ?Higher dose pending ?

## 2022-01-14 NOTE — Telephone Encounter (Signed)
Sheila Melton w/ [employer] requesting verbal diagnosis for trulicity, caller also inquiring if pt has tried and failed metformin ? ?Phone [phone] ?

## 2022-01-15 NOTE — Telephone Encounter (Signed)
Spoke with Lennette Bihari today and questions answered ?

## 2022-01-17 NOTE — Telephone Encounter (Signed)
Faxed received that Trulicity approved from 03/08-23 - 11/09/22. ? ?Fax sent [facility]. ? ?Request ID# [id number] ?

## 2022-01-20 ENCOUNTER — Ambulatory Visit (INDEPENDENT_AMBULATORY_CARE_PROVIDER_SITE_OTHER): Payer: PPO

## 2022-01-20 DIAGNOSIS — I700 Atherosclerosis of aorta: Secondary | ICD-10-CM

## 2022-01-20 DIAGNOSIS — I10 Essential (primary) hypertension: Secondary | ICD-10-CM

## 2022-01-20 DIAGNOSIS — E1159 Type 2 diabetes mellitus with other circulatory complications: Secondary | ICD-10-CM

## 2022-01-20 NOTE — Progress Notes (Addendum)
Chronic Care Management Pharmacy Note  01/20/2022 Name:  Sheila Melton MRN:  [id number] DOB:  1940-11-23  Summary: -Patient reports that since starting trulicity she has not had any issues similar to rybelsus - notes to occasional mild indigestion which is controlled with tums prn  -checking BG at home, at goal - averaging 120-130 fasted, denies any issues with low blood sugars  -BP at home averaging 118-120/65-70's  -Reports compliance with current medications - due to starting increase dose of trulicity next week   Recommendations/Changes made from today's visit: -Recommended for patient to hold glipizide when 1.$RemoveBeforeDE'5mg'mnZjAfNsXRbAaJh$  weekly dose of trulicity is started - patient to continue to monitor BG at least once daily - to reach out should BG average >150 -Recommended for patient to receive COVID booster, Shingrix, and Tdap with next visit to pharmacy   Subjective: Sheila Melton is an 81 y.o. year old female who is a primary patient of Burns, Claudina Lick, MD.  The CCM team was consulted for assistance with disease management and care coordination needs.    Engaged with patient by telephone for follow up visit in response to provider referral for pharmacy case management and/or care coordination services.   Consent to Services:  The patient was given information about Chronic Care Management services, agreed to services, and gave verbal consent prior to initiation of services.  Please see initial visit note for detailed documentation.   Patient Care Team: Binnie Rail, MD as PCP - General (Internal Medicine) Wellington Hampshire, MD as PCP - Cardiology (Cardiology) Elmarie Shiley, MD as Consulting Physician (Nephrology)  Recent office visits: 1/58/3094 - Trial of trulicity 0.$RemoveBefore'75mg'mQURpLAdUZeIC$  weekly   Recent consult visits: None since last visit   Hospital visits: None in previous 6 months  Objective:  Lab Results  Component Value Date   CREATININE 2.59 (H) 08/21/2021   BUN 38 (H) 08/21/2021   GFR 17.21  (L) 08/21/2021   GFRNONAA 46 (L) 02/19/2017   GFRAA 53 (L) 02/19/2017   NA 139 08/21/2021   K 3.9 08/21/2021   CALCIUM 9.3 08/21/2021   CO2 25 08/21/2021   GLUCOSE 177 (H) 08/21/2021    Lab Results  Component Value Date/Time   HGBA1C 7.3 (A) 12/25/2021 02:04 PM   HGBA1C 7.3 12/25/2021 02:04 PM   HGBA1C 7.3 (A) 12/25/2021 02:04 PM   HGBA1C 7.0 12/25/2021 02:04 PM   HGBA1C 7.5 (H) 08/21/2021 02:13 PM   HGBA1C 7.5 (H) 05/21/2021 02:24 PM   FRUCTOSAMINE 332 (H) 04/25/2015 12:52 PM   GFR 17.21 (L) 08/21/2021 02:13 PM   GFR 17.81 (L) 05/21/2021 02:24 PM   MICROALBUR 194.0 Verified by manual dilution. (H) 11/16/2014 12:05 PM   MICROALBUR 95.5 (H) 05/09/2014 11:28 AM    Last diabetic Eye exam:  Lab Results  Component Value Date/Time   HMDIABEYEEXA No Retinopathy 10/29/2020 12:00 AM    Last diabetic Foot exam: No results found for: HMDIABFOOTEX   Lab Results  Component Value Date   CHOL 153 08/21/2021   HDL 40.40 08/21/2021   LDLCALC 70 05/21/2020   LDLDIRECT 59.0 08/21/2021   TRIG 342.0 (H) 08/21/2021   CHOLHDL 4 08/21/2021    Hepatic Function Latest Ref Rng & Units 08/21/2021 05/21/2021 11/21/2020  Total Protein 6.0 - 8.3 g/dL 7.0 7.0 7.3  Albumin 3.5 - 5.2 g/dL 4.1 4.1 4.2  AST 0 - 37 U/L $Remo'18 17 16  'PEpcL$ ALT 0 - 35 U/L $Remo'13 12 12  'oGnSs$ Alk Phosphatase 39 - 117 U/L 75 71  100  Total Bilirubin 0.2 - 1.2 mg/dL 0.3 0.3 0.3  Bilirubin, Direct 0.0 - 0.3 mg/dL - - -    Lab Results  Component Value Date/Time   TSH 3.01 11/21/2020 03:13 PM   TSH 2.95 11/17/2018 03:03 PM   FREET4 0.78 11/27/2011 01:53 PM   FREET4 0.8 12/31/2007 08:13 AM    CBC Latest Ref Rng & Units 08/21/2021 11/21/2020 05/21/2020  WBC 4.0 - 10.5 K/uL 11.3(H) 12.6(H) 11.2(H)  Hemoglobin 12.0 - 15.0 g/dL 10.7(L) 11.6(L) 10.8(L)  Hematocrit 36.0 - 46.0 % 32.5(L) 34.7(L) 32.6(L)  Platelets 150.0 - 400.0 K/uL 328.0 350.0 318    Lab Results  Component Value Date/Time   VD25OH 32 09/07/2020 04:19 PM   VD25OH 87  10/11/2010 08:57 PM    Clinical ASCVD: Yes  The 10-year ASCVD risk score (Arnett DK, et al., 2019) is: 53.6%   Values used to calculate the score:     Age: 45 years     Sex: Female     Is Non-Hispanic African American: No     Diabetic: Yes     Tobacco smoker: Yes     Systolic Blood Pressure: 916 mmHg     Is BP treated: Yes     HDL Cholesterol: 40.4 mg/dL     Total Cholesterol: 153 mg/dL    Depression screen [facility] 2/9 12/27/2021 07/10/2021 09/14/2020  Decreased Interest 2 1 0  Down, Depressed, Hopeless 2 1 0  PHQ - 2 Score 4 2 0  Altered sleeping 1 0 -  Tired, decreased energy 2 1 -  Change in appetite 1 0 -  Feeling bad or failure about yourself  0 0 -  Trouble concentrating 1 1 -  Moving slowly or fidgety/restless 0 0 -  Suicidal thoughts 0 0 -  PHQ-9 Score 9 4 -  Difficult doing work/chores Somewhat difficult Not difficult at all -  Some recent data might be hidden     Social History   Tobacco Use  Smoking Status Light Smoker   Packs/day: 1.00   Years: 50.00   Pack years: 50.00   Types: E-cigarettes, Cigarettes  Smokeless Tobacco Never  Tobacco Comments   e-cigs   BP Readings from Last 3 Encounters:  12/25/21 116/68  11/27/21 (!) 172/70  10/15/21 112/60   Pulse Readings from Last 3 Encounters:  12/25/21 67  11/27/21 72  10/15/21 95   Wt Readings from Last 3 Encounters:  12/25/21 158 lb 3.2 oz (71.8 kg)  10/15/21 155 lb (70.3 kg)  08/21/21 158 lb (71.7 kg)   BMI Readings from Last 3 Encounters:  12/25/21 27.15 kg/m  10/15/21 26.61 kg/m  08/21/21 27.12 kg/m    Assessment/Interventions: Review of patient past medical history, allergies, medications, health status, including review of consultants reports, laboratory and other test data, was performed as part of comprehensive evaluation and provision of chronic care management services.   SDOH:  (Social Determinants of Health) assessments and interventions performed: Yes  SDOH Screenings   Alcohol  Screen: Not on file  Depression (PHQ2-9): Medium Risk   PHQ-2 Score: 9  Financial Resource Strain: Not on file  Food Insecurity: Not on file  Housing: Not on file  Physical Activity: Not on file  Social Connections: Not on file  Stress: Not on file  Tobacco Use: High Risk   Smoking Tobacco Use: Light Smoker   Smokeless Tobacco Use: Never   Passive Exposure: Not on file  Transportation Needs: Not on file    CCM Care  Plan  Allergies  Allergen Reactions   Rocephin [Ceftriaxone Sodium In Dextrose] Dermatitis and Rash    10/24 /14 blisters of palms & diffuse rash Because of a history of documented adverse serious drug reaction;Medi Alert bracelet  is recommended   Amoxicillin Rash    Has patient had a PCN reaction causing IMMEDIATE RASH, FACIAL/TONGUE/THROAT SWELLING, SOB, OR LIGHTHEADEDNESS WITH HYPOTENSION:  #  #  #  YES  #  #  #  Has patient had a PCN reaction causing severe rash involving mucus membranes or skin necrosis: No Has patient had a PCN reaction that required hospitalization No Has patient had a PCN reaction occurring within the last 10 years: No If all of the above answers are "NO", then may proceed with Cephalosporin use.    Captopril Cough   Simvastatin Other (See Comments)    MYALGIAS BUTTOCKS CRAMP   Ciprofloxacin Other (See Comments)    Sores in mouth    Semaglutide Nausea And Vomiting   Adhesive [Tape] Rash   Latex Rash    Patient states she gets red and a rash when latex touches her.    Medications Reviewed Today     Reviewed by Tomasa Blase, [facility] (Pharmacist) on 01/20/22 at 1011  Med List Status: <None>   Medication Order Taking? Sig Documenting Provider Last Dose Status Informant  amLODipine (NORVASC) 5 MG tablet [id number] Yes Take 1 tablet (5 mg total) by mouth daily. Binnie Rail, MD Taking Active   ascorbic Acid (VITAMIN C) 500 MG CPCR [id number] Yes 1 cap PO QD [provider] Taking Active   B Complex-C (SUPER B COMPLEX PO)  [id number] Yes Take 1 tablet by mouth daily.  [provider] Taking Active Self  Blood Glucose Monitoring Suppl (ONE TOUCH ULTRA 2) w/Device KIT [id number] Yes Use as advised Philemon Kingdom, MD Taking Active Self  Cholecalciferol (VITAMIN D3) 125 MCG (5000 UT) TABS [id number] Yes Take 5,000 Units by mouth daily. [provider] Taking Active Self  diazepam (VALIUM) 5 MG tablet [id number] Yes TAKE ONE TABLET BY MOUTH AT BEDTIME AS NEEDED. Binnie Rail, MD Taking Active   Dulaglutide (TRULICITY) 1.5 HK/7.4QV [facility] [id number] Yes Inject 1.5 mg into the skin once a week. Binnie Rail, MD Taking Active   fexofenadine [facility]) 180 MG tablet [id number] Yes Take 180 mg by mouth at bedtime. [provider] Taking Active Self  FLUoxetine (PROZAC) 10 MG capsule [id number] Yes TAKE ONE CAPSULE BY MOUTH DAILY Burns, Claudina Lick, MD Taking Active   furosemide (LASIX) 40 MG tablet [id number] Yes TAKE ONE-HALF TO ONE TABLET BY MOUTH DAILY AS NEEDED FOR IN FLUID. Binnie Rail, MD Taking Active   glipiZIDE (GLUCOTROL) 5 MG tablet [id number] Yes TAKE ONE TABLET BY MOUTH IN THE MORNING AND TAKE TWO TABLETS IN THE Gennaro Africa, MD Taking Active   KERENDIA 10 MG TABS [id number] Yes Take 1 tablet by mouth daily. [provider] Taking Active   labetalol (NORMODYNE) 300 MG tablet [id number] Yes Take 1 tablet (300 mg total) by mouth 2 (two) times daily. Binnie Rail, MD Taking Active   Liniments [facility] PAIN RELIEF PATCH EX) [id number] Yes Apply 1 patch topically daily as needed (back pain). [provider] Taking Active Self  losartan (COZAAR) 100 MG tablet [id number] Yes TAKE ONE TABLET BY MOUTH DAILY Quay Burow, Claudina Lick, MD Taking Active   [facility] ULTRA test strip [id number] Yes CHECK BLOOD SUGAR TWICE DAILY. Binnie Rail, MD Taking Active  oxyCODONE-acetaminophen (PERCOCET/ROXICET) 5-325 MG tablet [id number] Yes Take 1 tablet by mouth 3 (three) times daily as needed. [provider] Taking Active   PREVIDENT 5000 DRY MOUTH 1.1 % GEL dental gel [id number] Yes Place onto teeth. [provider] Taking Active   rosuvastatin (CRESTOR) 20 MG tablet [id number] Yes TAKE ONE TABLET BY MOUTH DAILY Burns, Claudina Lick, MD Taking Active   Triamcinolone Acetonide (NASACORT ALLERGY 24HR NA) [id number] Yes Place 1 spray into the nose 2 (two) times daily as needed (congestion). [provider] Taking Active Self  vitamin B-12 (CYANOCOBALAMIN) 1000 MCG tablet [id number] Yes Take 1,000 mcg by mouth daily. [provider] Taking Active Self  Vitamin E 180 MG CAPS [id number] Yes Take 180 mg by mouth daily. 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   CKD (chronic kidney disease) 05/21/2016   Sleep difficulties 01/18/2016   Diabetes mellitus ([place]) 10/23/2015   Numbness of toes-Right foot 06/26/2015   Colitis 03/19/2015   Nephrolithiasis 03/19/2015   AAA (abdominal aortic aneurysm) without rupture ([place]) 03/19/2015   Anxiety state 03/19/2015   Skin cancer 01/09/2015   Former smoker 05/24/2014   Bilateral renal cysts 09/16/2012   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[place]) 07/26/2012   Degenerative spondylolisthesis 07/26/2012   SHOULDER PAIN, LEFT, CHRONIC 07/25/2009   CARDIAC MURMUR, SYSTOLIC 11/18/3233   Mixed hyperlipidemia 12/30/2007   Tobacco use disorder 12/30/2007   Essential hypertension 12/30/2007   Subclavian steal syndrome 12/30/2007   CAROTID BRUIT 04/07/2007    Immunization History  Administered Date(s) Administered   Fluad Quad(high Dose 65+) 08/24/2019,  08/21/2020, 08/21/2021   Influenza Split 08/28/2011, 09/07/2012   Influenza Whole 09/06/2008, 08/30/2009, 10/11/2010   Influenza, High Dose Seasonal PF 08/24/2013, 08/18/2014, 08/07/2015, 08/08/2016, 07/31/2017, 08/10/2018   PFIZER(Purple Top)SARS-COV-2 Vaccination 11/29/2019, 12/19/2019, 11/16/2020   Pneumococcal Conjugate-13 09/26/2015   Pneumococcal Polysaccharide-23 12/10/2009    Conditions to be addressed/monitored:  Hypertension, Hyperlipidemia, Diabetes, Coronary Artery Disease, and Chronic Kidney Disease  Care Plan : [place]  Updates made by Tomasa Blase, RPH since 01/20/2022 12:00 AM     Problem: Hypertension, Hyperlipidemia, Diabetes, Coronary Artery Disease, and Chronic Kidney Disease   Priority: High     Long-Range Goal: Disease management   Start Date: 06/25/2021  Expected End Date: 10/23/2022  This Visit's Progress: On track  Recent Progress: Not on track  Priority: High  Note:   Current Barriers:  Unable to independently monitor therapeutic efficacy Unable to maintain control of diabetes  Pharmacist Clinical Goal(s):  Patient will achieve adherence to monitoring guidelines and medication adherence to achieve therapeutic efficacy adhere to plan to optimize therapeutic regimen for Diabetes as evidenced by report of adherence to recommended medication management changes through collaboration with PharmD and provider.   Interventions: 1:1 collaboration with Binnie Rail, MD regarding development and update of comprehensive plan of care as evidenced by provider attestation and co-signature Inter-disciplinary care team collaboration (see longitudinal plan of care) Comprehensive medication review performed; medication list updated in electronic medical record  Hyperlipidemia: (LDL goal < 70) Controlled  -Last LDL: 52 mg/dL -Current treatment: Rosuvastatin  20 mg daily -Pt may benefit from changing rosuvastatin; atorvastatin is preferred  high-intensity statin with GFR < 30 -Consider changing rosuvastatin to atorvastatin  Hypertension (BP goal <140/90) - Controlled  -Current treatment: Amlodipine 5mg  daily  Labetolol 300mg  - 3 times daily - patient reports to taking 2 times daily  Losartan 100mg  daily  Furosemide 40mg  - 1 tablet daily as needed for swelling  -Medications previously tried: Spironolactone, Valsartan  -Current home readings: reports that at home typically is averaging 118/70 with HR of 80-90bpm -Current dietary habits: reports to low sodium diet -Current exercise habits: Limited due to chronic back pain  -Reports hypotensive/hypertensive symptoms - notes that dizziness has decreased since amlodipine was decreased - no issues with syncope since decrease  -Educated on BP goals and benefits of medications for prevention of heart attack, stroke and kidney damage; Daily salt intake goal < 2300 mg; Importance of home blood pressure monitoring; Proper BP monitoring technique; Symptoms of hypotension and importance of maintaining adequate hydration; -Counseled to monitor BP at home 3 times weekly, document, and provide log at future appointments -Counseled on diet and exercise extensively Recommended to continue current medication  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%) Improving - reports no issues tolerating trulicity since she has started - will be increasing to 1.5mg  weekly dose starting next week  Lab Results  Component Value Date   HGBA1C 7.3 (A) 12/25/2021   HGBA1C 7.3 12/25/2021   HGBA1C 7.3 (A) 12/25/2021   HGBA1C 7.0 12/25/2021  -Current home glucose readings: AM averaging at least less than 150 - typically ~120-130 -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, 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, tacrolimus -Current medications: Glipizide 5 mg -1 tab in AM 2 tab at dinner Kerendia 10 mg daily (prescribed by nephrology) Trulicity 1.5mg  weekly (has just completed 0.75mg  weekly dose - to start next week) -Medications previously  tried: glipizide, metformin, Januvia, pioglitazone, rybelsus;  -Recommend to continue current medication until increase in trulicity dose next week - once started on 1.5mg  weekly dose of trulicity - hold glipizide - continue to monitor blood sugar at least once daily, to reach out for BG >150 fasting    - reviewed with patient carb counting / dietary suggestions to prevent BG increases   Back pain (Goal: manage pain) -Not ideally controlled - pt reports she is in significant pain very day, which prevents her from being [place]; she sees pain mgmt regularly -Pt has spine stimulator implanted - pt reports it has not helped much -Current treatment  Oxycodone-APAP 5-325 mg #60 per month -Counseled that pain and stress can increase BG; advised to take oxycodone as directed -Recommended to continue current medication  Patient Goals/Self-Care Activities Patient will:  - take medications as prescribed -focus on medication adherence by pill box -check glucose at least once daily      Care Gaps: Hep C screening  Patient's preferred pharmacy is:  [id number], [place] [address] Phone: [phone] Fax: [place], [place] [address] Phone: [phone] Fax: [phone]   Uses pill box? Yes Pt endorses 100% compliance  We discussed: Current pharmacy is preferred with insurance plan and patient is satisfied with pharmacy services Patient decided to: Continue current medication management strategy  Care Plan and Follow Up Patient Decision:  Patient agrees to Care Plan and Follow-up.  Plan: Telephone follow up  appointment with care management team member scheduled for:  3 months  Tomasa Blase, PharmD Clinical Pharmacist, [place]

## 2022-01-20 NOTE — Patient Instructions (Signed)
Visit Information ? ?Following are the goals we discussed today:  ? ?Track and Manage My Blood Sugars ? ?Timeframe:  Long-Range Goal ?Priority:  High ?Start Date:      06/25/21                       ?Expected End Date:      10/25/2022           ? ?Follow Up Date June 2023 ?  ?- check blood sugar at prescribed times ?- check blood sugar if I feel it is too high or too low ?- take the blood sugar meter to all doctor visits ?  ?Why is this important?   ?Checking your blood sugar at home helps to keep it from getting very high or very low.  ?Writing the results in a diary or log helps the doctor know how to care for you.  ?Your blood sugar log should have the time, date and the results.  ?Also, write down the amount of insulin or other medicine that you take.  ?Other information, like what you ate, exercise done and how you were feeling, will also be helpful.   ? ?Plan: Telephone follow up appointment with care management team member scheduled for:  3 months ?The patient has been provided with contact information for the care management team and has been advised to call with any health related questions or concerns.  ? ?Tomasa Blase, PharmD ?Clinical Pharmacist, [place]  ? ?Please call the care guide team at [phone] if you need to cancel or reschedule your appointment.  ? ?Patient verbalizes understanding of instructions and care plan provided today and agrees to view in [place]. Active MyChart status confirmed with patient.   ? ? ?

## 2022-01-24 ENCOUNTER — Other Ambulatory Visit: Payer: Self-pay

## 2022-01-24 ENCOUNTER — Ambulatory Visit
Admission: RE | Admit: 2022-01-24 | Discharge: 2022-01-24 | Disposition: A | Discharge status: Home / Self Care | Payer: PPO | Source: Ambulatory Visit | Attending: Vascular Surgery | Admitting: Vascular Surgery

## 2022-01-24 DIAGNOSIS — K5730 Diverticulosis of large intestine without perforation or abscess without bleeding: Secondary | ICD-10-CM | POA: Diagnosis not present

## 2022-01-24 DIAGNOSIS — I714 Abdominal aortic aneurysm, without rupture, unspecified: Secondary | ICD-10-CM

## 2022-01-28 ENCOUNTER — Encounter: Payer: Self-pay | Admitting: Vascular Surgery

## 2022-01-28 ENCOUNTER — Other Ambulatory Visit: Payer: Self-pay

## 2022-01-28 ENCOUNTER — Ambulatory Visit: Payer: PPO | Admitting: Vascular Surgery

## 2022-01-28 VITALS — BP 111/68 | HR 92 | Temp 97.9°F | Resp 14 | Ht 64.0 in | Wt 154.0 lb

## 2022-01-28 DIAGNOSIS — I7143 Infrarenal abdominal aortic aneurysm, without rupture: Secondary | ICD-10-CM | POA: Diagnosis not present

## 2022-01-28 NOTE — Progress Notes (Signed)
Patient name: Sheila Melton MRN: [id number] DOB: 10-19-1941 Sex: female  REASON FOR VISIT: 6 month follow-up for AAA  HPI: Sheila Melton is a 81 y.o. female with hx chronic back pain with spinal stimulator, HTN, HLD, stage IV CKD, DM that presents for 6 month follow-up of her AAA.  Her aneurysm has been followed by Dr. Arbie Cookey.  She last saw Dr. Arbie Cookey 07/10/21 with a 5.0 cm AAA.  He recommended follow-up with me in 6 months.  She presents for follow-up with a non-contrast CT scan today.  She has no new abdominal pain except for chronic back pain.  She is still using e-cigarettes.  She was previously told she had very poor access vessels for repair with endograft.  Past Medical History:  Diagnosis Date   AAA (abdominal aortic aneurysm)    BEING WATCHED   VVS. 4.2 cm infrarenal abdominal aortic aneurysm without rupture noted 11/08/2016   Arthritis    Cancer (HCC)    skin cancer removed from left shoulder  2017   CAP (community acquired pneumonia) 09/02/13-11/05/13    with SIRS   Depression    ?? from prednisone    not on pred now   Diabetes mellitus    type ii    long time ago.   Family history of anesthesia complication    PATIENTS MOM HAD TROUBLE WAKING UP    GERD (gastroesophageal reflux disease)    will take occasional tums   Heart murmur    still has.   Sees Dr. Jens Som   History of kidney stones    has them at the present time   Hyperlipidemia    Hypertension    unspecified essential   Renal insufficiency    "FSGS"   Subclavian steal syndrome    LEFT SIDE   NO SIDE EFFECTS   Subclavian steal syndrome --  left    85% blocked   UTI (lower urinary tract infection) 08/26/13   Enterococcus and Escherichia coli both sensitive to nitrofurantoin    Past Surgical History:  Procedure Laterality Date   ABDOMINAL HYSTERECTOMY     BACK SURGERY     CATARACT EXTRACTION     OS   CYST EXCISION Right 05-23-13   Right index finger: cyst   DILATION AND CURETTAGE OF UTERUS     EYE  SURGERY     g1 p1     SPINE SURGERY  Sept. 2013   VIDEO ASSISTED THORACOSCOPY (VATS)/ LOBECTOMY Right 02/16/2017   Procedure: Right VIDEO ASSISTED THORACOSCOPY (VATS)/ Right Middle LOBECTOMY;  Surgeon: Loreli Slot, MD;  Location: [facility];  Service: Thoracic;  Laterality: Right;    Family History  Problem Relation Age of Onset   Heart failure Mother    Heart disease Mother    Hypertension Mother    Other Mother        varicose veins   Deep vein thrombosis Mother    Varicose Veins Mother    Heart attack Mother    Hypertension Sister        X 2   Diabetes Sister    Hyperlipidemia Sister    Other Sister        varicose veins   Heart disease Father    Diabetes Father    Hyperlipidemia Father    Hypertension Father    Heart attack Father    Heart disease Brother        MI in late 74s   Hyperlipidemia Brother  Heart attack Brother    Coronary artery disease Other    Diabetes Other    Parkinsonism Brother    Heart failure Brother    Hypertension Daughter     SOCIAL HISTORY: Social History   Tobacco Use   Smoking status: Light Smoker    Packs/day: 1.00    Years: 50.00    Pack years: 50.00    Types: E-cigarettes, Cigarettes   Smokeless tobacco: Never   Tobacco comments:    e-cigs  Substance Use Topics   Alcohol use: No    Allergies  Allergen Reactions   Rocephin [Ceftriaxone Sodium In Dextrose] Dermatitis and Rash    10/24 /14 blisters of palms & diffuse rash Because of a history of documented adverse serious drug reaction;Medi Alert bracelet  is recommended   Amoxicillin Rash    Has patient had a PCN reaction causing IMMEDIATE RASH, FACIAL/TONGUE/THROAT SWELLING, SOB, OR LIGHTHEADEDNESS WITH HYPOTENSION:  #  #  #  YES  #  #  #  Has patient had a PCN reaction causing severe rash involving mucus membranes or skin necrosis: No Has patient had a PCN reaction that required hospitalization No Has patient had a PCN reaction occurring within the last 10 years:  No If all of the above answers are "NO", then may proceed with Cephalosporin use.    Captopril Cough   Simvastatin Other (See Comments)    MYALGIAS BUTTOCKS CRAMP   Ciprofloxacin Other (See Comments)    Sores in mouth    Semaglutide Nausea And Vomiting   Adhesive [Tape] Rash   Latex Rash    Patient states she gets red and a rash when latex touches her.    Current Outpatient Medications  Medication Sig Dispense Refill   amLODipine (NORVASC) 5 MG tablet Take 1 tablet (5 mg total) by mouth daily. 90 tablet 3   ascorbic Acid (VITAMIN C) 500 MG CPCR 1 cap PO QD     B Complex-C (SUPER B COMPLEX PO) Take 1 tablet by mouth daily.      Blood Glucose Monitoring Suppl (ONE TOUCH ULTRA 2) w/Device KIT Use as advised 1 each 0   calcium carbonate (TUMS - DOSED IN MG ELEMENTAL CALCIUM) 500 MG chewable tablet Chew 1 tablet by mouth daily as needed for indigestion or heartburn.     Cholecalciferol (VITAMIN D3) 125 MCG (5000 UT) TABS Take 5,000 Units by mouth daily.     diazepam (VALIUM) 5 MG tablet TAKE ONE TABLET BY MOUTH AT BEDTIME AS NEEDED. 30 tablet 2   Dulaglutide (TRULICITY) 1.5 MG/0.5ML SOPN Inject 1.5 mg into the skin once a week. 2 mL 3   fexofenadine (ALLEGRA) 180 MG tablet Take 180 mg by mouth at bedtime.     FLUoxetine (PROZAC) 10 MG capsule TAKE ONE CAPSULE BY MOUTH DAILY 90 capsule 2   furosemide (LASIX) 40 MG tablet TAKE ONE-HALF TO ONE TABLET BY MOUTH DAILY AS NEEDED FOR IN FLUID. 90 tablet 0   KERENDIA 10 MG TABS Take 1 tablet by mouth daily.     labetalol (NORMODYNE) 300 MG tablet Take 1 tablet (300 mg total) by mouth 2 (two) times daily.     Liniments (SALONPAS PAIN RELIEF PATCH EX) Apply 1 patch topically daily as needed (back pain).     losartan (COZAAR) 100 MG tablet TAKE ONE TABLET BY MOUTH DAILY 90 tablet 0   ONETOUCH ULTRA test strip CHECK BLOOD SUGAR TWICE DAILY. 100 strip 0   oxyCODONE-acetaminophen (PERCOCET/ROXICET) 5-325 MG tablet Take  1 tablet by mouth 3 (three) times  daily as needed.     PREVIDENT 5000 DRY MOUTH 1.1 % GEL dental gel Place onto teeth.     rosuvastatin (CRESTOR) 20 MG tablet TAKE ONE TABLET BY MOUTH DAILY 90 tablet 0   Triamcinolone Acetonide (NASACORT ALLERGY 24HR NA) Place 1 spray into the nose 2 (two) times daily as needed (congestion).     vitamin B-12 (CYANOCOBALAMIN) 1000 MCG tablet Take 1,000 mcg by mouth daily.     Vitamin E 180 MG CAPS Take 180 mg by mouth daily.     glipiZIDE (GLUCOTROL) 5 MG tablet TAKE ONE TABLET BY MOUTH IN THE MORNING AND TAKE TWO TABLETS IN THE EVENING (Patient not taking: Reported on 01/28/2022) 270 tablet 0   No current facility-administered medications for this visit.    REVIEW OF SYSTEMS:  [X]  denotes positive finding, [ ]  denotes negative finding Cardiac  Comments:  Chest pain or chest pressure:    Shortness of breath upon exertion:    Short of breath when lying flat:    Irregular heart rhythm:        Vascular    Pain in calf, thigh, or hip brought on by ambulation:    Pain in feet at night that wakes you up from your sleep:     Blood clot in your veins:    Leg swelling:         Pulmonary    Oxygen at home:    Productive cough:     Wheezing:         Neurologic    Sudden weakness in arms or legs:     Sudden numbness in arms or legs:     Sudden onset of difficulty speaking or slurred speech:    Temporary loss of vision in one eye:     Problems with dizziness:         Gastrointestinal    Blood in stool:     Vomited blood:         Genitourinary    Burning when urinating:     Blood in urine:        Psychiatric    Major depression:         Hematologic    Bleeding problems:    Problems with blood clotting too easily:        Skin    Rashes or ulcers:        Constitutional    Fever or chills:      PHYSICAL EXAM: Vitals:   01/28/22 1348  BP: 111/68  Pulse: 92  Resp: 14  Temp: 97.9 F (36.6 C)  TempSrc: Temporal  SpO2: 97%  Weight: 154 lb (69.9 kg)  Height: 5\' 4"  (1.626  m)    GENERAL: The patient is a well-nourished female, in no acute distress. The vital signs are documented above. CARDIAC: There is a regular rate and rhythm.  VASCULAR:  Palpable femoral pulses bilaterally No palpable pedal pulses PULMONARY: No respiratory distress. ABDOMEN: Soft and non-tender.  No pain with palpation of aneurysm. MUSCULOSKELETAL: There are no major deformities or cyanosis. NEUROLOGIC: No focal weakness or paresthesias are detected. SKIN: There are no ulcers or rashes noted. PSYCHIATRIC: The patient has a normal affect.  DATA:   CT abdomen pelvis reviewed and by my measurement her AAA has increased from 5 cm to 5.3 cm based on coronal and sagittal measuring  Assessment/Plan:  81 year old female with multiple comorbidities including stage IV chronic kidney disease that  presents for evaluation and continued surveillance of her 5 cm abdominal aortic aneurysm.  I reviewed with her and her husband that her aneurysm has increased slightly to 5.3 cm over the last 6 months.  I again reviewed that we would typically recommend repair at greater than 5 cm in women.  Discussed standard of care with stent grafting but unfortunately she has very small and heavily calcified access vessels that I think would make stent graft repair not an option.  Discussed the option of an iliac conduit but her iliacs are nearly circumferential calcified and I do not think there would be a way to sew a conduit.  I think she would likely require open repair of her AAA.  I discussed current risk of rupture is probably in the 5 to 10% range.  Discussed with open AAA repair she likely faces a more than 30% risk of major complication including possible death and dialysis.  Ultimately we discussed all of these options and she has elected for continued surveillance knowing there would be some risk for rupture with continued surveillance.  I will see her again in 6 months with a CT abdomen pelvis without contrast.   I have also discussed her case with Dr. Arbie Cookey who is in agreement for continued surveillance at this time.   Cephus Shelling, MD [facility] Office: [phone]

## 2022-02-07 DIAGNOSIS — E1159 Type 2 diabetes mellitus with other circulatory complications: Secondary | ICD-10-CM

## 2022-02-07 DIAGNOSIS — I10 Essential (primary) hypertension: Secondary | ICD-10-CM

## 2022-02-10 ENCOUNTER — Other Ambulatory Visit: Payer: Self-pay | Admitting: Internal Medicine

## 2022-02-19 ENCOUNTER — Ambulatory Visit: Payer: PPO | Admitting: Internal Medicine

## 2022-02-27 DIAGNOSIS — Z9689 Presence of other specified functional implants: Secondary | ICD-10-CM | POA: Diagnosis not present

## 2022-02-27 DIAGNOSIS — G894 Chronic pain syndrome: Secondary | ICD-10-CM | POA: Diagnosis not present

## 2022-02-27 DIAGNOSIS — M542 Cervicalgia: Secondary | ICD-10-CM | POA: Diagnosis not present

## 2022-02-27 DIAGNOSIS — M961 Postlaminectomy syndrome, not elsewhere classified: Secondary | ICD-10-CM | POA: Diagnosis not present

## 2022-03-07 ENCOUNTER — Encounter: Payer: Self-pay | Admitting: Internal Medicine

## 2022-03-16 ENCOUNTER — Other Ambulatory Visit: Payer: Self-pay | Admitting: Internal Medicine

## 2022-03-19 ENCOUNTER — Ambulatory Visit (INDEPENDENT_AMBULATORY_CARE_PROVIDER_SITE_OTHER): Payer: PPO

## 2022-03-19 DIAGNOSIS — Z0000 Encounter for general adult medical examination without abnormal findings: Secondary | ICD-10-CM | POA: Diagnosis not present

## 2022-03-19 NOTE — Progress Notes (Signed)
? ?Subjective:  ? Sheila Melton is a 81 y.o. female who presents for Medicare Annual (Subsequent) preventive examination. ? ?Review of Systems    ? ?  ? ?   ?Objective:  ?  ?There were no vitals filed for this visit. ?There is no height or weight on file to calculate BMI. ? ? ?  07/10/2021  ?  3:00 PM 09/14/2020  ? 10:47 AM 12/13/2019  ?  8:52 AM 02/21/2019  ? 10:01 AM 08/18/2017  ?  9:32 AM 06/25/2017  ?  2:30 PM 02/16/2017  ?  4:00 PM  ?Advanced Directives  ?Does Patient Have a Medical Advance Directive? _0  Yes Yes  ?Type of Paramedic of [place];Living will [place];Living will [place];Living will [place];Living will [place];Living will [place];Living will Living will  ?Does patient want to make changes to medical advance directive?  No - Patient declined No - Patient declined    No - Patient declined  ?Copy of [place] in Chart?  No - copy requested    No - copy requested   ? ? ?Current Medications (verified) ?Outpatient Encounter Medications as of 03/19/2022  ?Medication Sig  ? amLODipine (NORVASC) 5 MG tablet Take 1 tablet (5 mg total) by mouth daily.  ? aspirin EC 81 MG tablet Take 81 mg by mouth daily. Swallow whole.  ? diazepam (VALIUM) 5 MG tablet TAKE ONE TABLET BY MOUTH AT BEDTIME AS NEEDED.  ? Dulaglutide (TRULICITY) 1.5 FA/2.1HY SOPN Inject 1.5 mg into the skin once a week.  ? FLUoxetine (PROZAC) 10 MG capsule TAKE ONE CAPSULE BY MOUTH DAILY  ? furosemide (LASIX) 40 MG tablet TAKE ONE-HALF TO ONE TABLET BY MOUTH DAILY AS NEEDED FOR IN FLUID.  ? glipiZIDE (GLUCOTROL) 5 MG tablet TAKE ONE TABLET BY MOUTH IN THE MORNING AND TAKE TWO TABLETS IN THE EVENING  ? KERENDIA 10 MG TABS Take 1 tablet by mouth daily.  ? labetalol (NORMODYNE) 300 MG tablet Take 1 tablet (300 mg total) by mouth 2 (two) times daily.  ? losartan (COZAAR) 100 MG tablet TAKE ONE  TABLET BY MOUTH DAILY  ? oxyCODONE-acetaminophen (PERCOCET/ROXICET) 5-325 MG tablet Take 1 tablet by mouth 3 (three) times daily as needed.  ? rosuvastatin (CRESTOR) 20 MG tablet TAKE ONE TABLET BY MOUTH DAILY  ? ascorbic Acid (VITAMIN C) 500 MG CPCR 1 cap PO QD  ? B Complex-C (SUPER B COMPLEX PO) Take 1 tablet by mouth daily.   ? Blood Glucose Monitoring Suppl (ONE TOUCH ULTRA 2) w/Device KIT Use as advised  ? calcium carbonate (TUMS - DOSED IN MG ELEMENTAL CALCIUM) 500 MG chewable tablet Chew 1 tablet by mouth daily as needed for indigestion or heartburn.  ? Cholecalciferol (VITAMIN D3) 125 MCG (5000 UT) TABS Take 5,000 Units by mouth daily.  ? fexofenadine (ALLEGRA) 180 MG tablet Take 180 mg by mouth at bedtime.  ? Liniments (SALONPAS PAIN RELIEF PATCH EX) Apply 1 patch topically daily as needed (back pain).  ? ONETOUCH ULTRA test strip CHECK BLOOD SUGAR TWICE DAILY.  ? PREVIDENT 5000 DRY MOUTH 1.1 % GEL dental gel Place onto teeth.  ? Triamcinolone Acetonide (NASACORT ALLERGY 24HR NA) Place 1 spray into the nose 2 (two) times daily as needed (congestion).  ? vitamin B-12 (CYANOCOBALAMIN) 1000 MCG tablet Take 1,000 mcg by mouth daily.  ? Vitamin E 180 MG CAPS Take 180  mg by mouth daily.  ? ?No facility-administered encounter medications on file as of 03/19/2022.  ? ? ?Allergies (verified) ?Rocephin [ceftriaxone sodium in dextrose], Amoxicillin, Captopril, Simvastatin, Ciprofloxacin, Semaglutide, Adhesive [tape], and Latex  ? ?History: ?Past Medical History:  ?Diagnosis Date  ? AAA (abdominal aortic aneurysm) ([place])   ? BEING WATCHED   VVS. 4.2 cm infrarenal abdominal aortic aneurysm without rupture noted 11/08/2016  ? Arthritis   ? Cancer [facility])   ? skin cancer removed from left shoulder  2017  ? CAP (community acquired pneumonia) 09/02/13-11/05/13  ?  with SIRS  ? Depression   ? ?? from prednisone    not on pred now  ? Diabetes mellitus   ? type ii    long time ago.  ? Family history of anesthesia complication   ?  PATIENTS MOM HAD TROUBLE WAKING UP   ? GERD (gastroesophageal reflux disease)   ? will take occasional tums  ? Heart murmur   ? still has.   Sees Dr. Stanford Breed  ? History of kidney stones   ? has them at the present time  ? Hyperlipidemia   ? Hypertension   ? unspecified essential  ? Renal insufficiency   ? "FSGS"  ? Subclavian steal syndrome   ? LEFT SIDE   NO SIDE EFFECTS  ? Subclavian steal syndrome --  left   ? 85% blocked  ? UTI (lower urinary tract infection) 08/26/13  ? Enterococcus and Escherichia coli both sensitive to nitrofurantoin  ? ?Past Surgical History:  ?Procedure Laterality Date  ? ABDOMINAL HYSTERECTOMY    ? BACK SURGERY    ? CATARACT EXTRACTION    ? OS  ? CYST EXCISION Right 05-23-13  ? Right index finger: cyst  ? DILATION AND CURETTAGE OF UTERUS    ? EYE SURGERY    ? g1 p1    ? SPINE SURGERY  Sept. 2013  ? VIDEO ASSISTED THORACOSCOPY (VATS)/ LOBECTOMY Right 02/16/2017  ? Procedure: Right VIDEO ASSISTED THORACOSCOPY (VATS)/ Right Middle LOBECTOMY;  Surgeon: [name], MD;  Location: [place];  Service: Thoracic;  Laterality: Right;  ? ?Family History  ?Problem Relation Age of Onset  ? Heart failure Mother   ? Heart disease Mother   ? Hypertension Mother   ? Other Mother   ?     varicose veins  ? Deep vein thrombosis Mother   ? Varicose Veins Mother   ? Heart attack Mother   ? Hypertension Sister   ?     X 2  ? Diabetes Sister   ? Hyperlipidemia Sister   ? Other Sister   ?     varicose veins  ? Heart disease Father   ? Diabetes Father   ? Hyperlipidemia Father   ? Hypertension Father   ? Heart attack Father   ? Heart disease Brother   ?     MI in late 24s  ? Hyperlipidemia Brother   ? Heart attack Brother   ? Coronary artery disease Other   ? Diabetes Other   ? Parkinsonism Brother   ? Heart failure Brother   ? Hypertension Daughter   ? ?Social History  ? ?Socioeconomic History  ? Marital status: Married  ?  Spouse name: Not on file  ? Number of children: 1  ? Years of education: Not on file   ? Highest education level: Not on file  ?Occupational History  ? Not on file  ?Tobacco Use  ? Smoking status: Light Smoker  ?  Packs/day: 1.00  ?  Years: 50.00  ?  Pack years: 50.00  ?  Types: E-cigarettes, Cigarettes  ? Smokeless tobacco: Never  ? Tobacco comments:  ?  e-cigs  ?Vaping Use  ? Vaping Use: Never used  ?Substance and Sexual Activity  ? Alcohol use: No  ? Drug use: No  ? Sexual activity: Not on file  ?Other Topics Concern  ? Not on file  ?Social History Narrative  ? Has 3 caffeinated bev a day  ?   ? Exercise: none  ? ?Social Determinants of Health  ? ?Financial Resource Strain: Low Risk   ? Difficulty of Paying Living Expenses: Not hard at all  ?Food Insecurity: No Food Insecurity  ? Worried About Charity fundraiser in the Last Year: Never true  ? Ran Out of Food in the Last Year: Never true  ?Transportation Needs: No Transportation Needs  ? Lack of Transportation (Medical): No  ? Lack of Transportation (Non-Medical): No  ?Physical Activity: Not on file  ?Stress: No Stress Concern Present  ? Feeling of Stress : Only a little  ?Social Connections: Socially Integrated  ? Frequency of Communication with Friends and Family: Three times a week  ? Frequency of Social Gatherings with Friends and Family: Twice a week  ? Attends Religious Services: More than 4 times per year  ? Active Member of Clubs or Organizations: Yes  ? Attends Archivist Meetings: More than 4 times per year  ? Marital Status: Married  ? ? ?Tobacco Counseling ?Ready to quit: Not Answered ?Counseling given: Not Answered ?Tobacco comments: e-cigs ? ? ?Clinical Intake: ? ?  ? ?  ? ?  ? ?  ? ?  ? ?Diabetic?Yes ? ?  ? ?  ? ? ?Activities of Daily Living ?   ? View : No data to display.  ?  ?  ?  ? ? ?Patient Care Team: ?Binnie Rail, MD as PCP - General (Internal Medicine) ?Wellington Hampshire, MD as PCP - Cardiology (Cardiology) ?Elmarie Shiley, MD as Consulting Physician (Nephrology) ? ?Indicate any recent Medical Services you may have  received from other than [facility] providers in the past year (date may be approximate). ? ?   ?Assessment:  ? This is a routine wellness examination for Mykira. ? ?Hearing/Vision screen ?No results found. ? ?[place]

## 2022-03-23 NOTE — Progress Notes (Signed)
? ? ? ? ?Subjective:  ? ? Patient ID: Sheila Melton, female    DOB: 08-30-1941, 81 y.o.   MRN: [id number] ? ? ? ? ?HPI ?[name] is here for follow up of her chronic medical problems, including DM, htn, CKD ? ?She thought Trulicity was causing GERD - she has to take Tums a lot.  At times it causes chest pain and back pain from the GERD.  She ended up taking 2 Tums and 2 over-the-counter nauzean pills at 1 point and has not had any symptoms since then.  She took the Trulicity this morning and has been doing fine with it, so she does not think the symptoms were related to Trulicity. ? ?Sugar this morning 135.  Mornings have been good.  She does not check it at night.  ? ?Medications and allergies reviewed with patient and updated if appropriate. ? ?Current Outpatient Medications on File Prior to Visit  ?Medication Sig Dispense Refill  ? amLODipine (NORVASC) 5 MG tablet Take 1 tablet (5 mg total) by mouth daily. 90 tablet 3  ? ascorbic Acid (VITAMIN C) 500 MG CPCR 1 cap PO QD    ? aspirin EC 81 MG tablet Take 81 mg by mouth daily. Swallow whole.    ? B Complex-C (SUPER B COMPLEX PO) Take 1 tablet by mouth daily.     ? Blood Glucose Monitoring Suppl (ONE TOUCH ULTRA 2) w/Device KIT Use as advised 1 each 0  ? calcium carbonate (TUMS - DOSED IN MG ELEMENTAL CALCIUM) 500 MG chewable tablet Chew 1 tablet by mouth daily as needed for indigestion or heartburn.    ? Cholecalciferol (VITAMIN D3) 125 MCG (5000 UT) TABS Take 5,000 Units by mouth daily.    ? diazepam (VALIUM) 5 MG tablet TAKE ONE TABLET BY MOUTH AT BEDTIME AS NEEDED. 30 tablet 2  ? Dulaglutide (TRULICITY) 1.5 CZ/6.6AY SOPN Inject 1.5 mg into the skin once a week. 2 mL 3  ? fexofenadine (ALLEGRA) 180 MG tablet Take 180 mg by mouth at bedtime.    ? FLUoxetine (PROZAC) 10 MG capsule TAKE ONE CAPSULE BY MOUTH DAILY 90 capsule 2  ? furosemide (LASIX) 40 MG tablet TAKE ONE-HALF TO ONE TABLET BY MOUTH DAILY AS NEEDED FOR IN FLUID. 90 tablet 0  ? glipiZIDE (GLUCOTROL) 5 MG  tablet TAKE ONE TABLET BY MOUTH IN THE MORNING AND TAKE TWO TABLETS IN THE EVENING 270 tablet 0  ? KERENDIA 10 MG TABS Take 1 tablet by mouth daily.    ? labetalol (NORMODYNE) 300 MG tablet Take 1 tablet (300 mg total) by mouth 2 (two) times daily.    ? Liniments (SALONPAS PAIN RELIEF PATCH EX) Apply 1 patch topically daily as needed (back pain).    ? losartan (COZAAR) 100 MG tablet TAKE ONE TABLET BY MOUTH DAILY 90 tablet 0  ? ONETOUCH ULTRA test strip CHECK BLOOD SUGAR TWICE DAILY. 100 strip 0  ? oxyCODONE-acetaminophen (PERCOCET/ROXICET) 5-325 MG tablet Take 1 tablet by mouth 3 (three) times daily as needed.    ? PREVIDENT 5000 DRY MOUTH 1.1 % GEL dental gel Place onto teeth.    ? rosuvastatin (CRESTOR) 20 MG tablet TAKE ONE TABLET BY MOUTH DAILY 90 tablet 0  ? Triamcinolone Acetonide (NASACORT ALLERGY 24HR NA) Place 1 spray into the nose 2 (two) times daily as needed (congestion).    ? vitamin B-12 (CYANOCOBALAMIN) 1000 MCG tablet Take 1,000 mcg by mouth daily.    ? Vitamin E 180 MG CAPS Take  180 mg by mouth daily.    ? ?No current facility-administered medications on file prior to visit.  ? ? ? ?Review of Systems  ?Constitutional:  Negative for chills and fever.  ?Respiratory:  Negative for cough, shortness of breath and wheezing.   ?Cardiovascular:  Negative for chest pain, palpitations and leg swelling.  ?Neurological:  Positive for light-headedness (When standing). Negative for headaches.  ? ?   ?Objective:  ? ?Vitals:  ? 03/24/22 1455  ?BP: 110/64  ?Pulse: 91  ?Temp: 98.2 ?F (36.8 ?C)  ?SpO2: 97%  ? ?BP Readings from Last 3 Encounters:  ?03/24/22 110/64  ?01/28/22 111/68  ?12/25/21 116/68  ? ?Wt Readings from Last 3 Encounters:  ?03/24/22 156 lb (70.8 kg)  ?01/28/22 154 lb (69.9 kg)  ?12/25/21 158 lb 3.2 oz (71.8 kg)  ? ?Body mass index is 26.78 kg/m?. ? ?  ?Physical Exam ?Constitutional:   ?   General: She is not in acute distress. ?   Appearance: Normal appearance.  ?HENT:  ?   Head: Normocephalic and  atraumatic.  ?Eyes:  ?   Conjunctiva/sclera: Conjunctivae normal.  ?Cardiovascular:  ?   Rate and Rhythm: Normal rate and regular rhythm.  ?   Heart sounds: Normal heart sounds. No murmur heard. ?Pulmonary:  ?   Effort: Pulmonary effort is normal. No respiratory distress.  ?   Breath sounds: Normal breath sounds. No wheezing.  ?Musculoskeletal:  ?   Cervical back: Neck supple.  ?   Right lower leg: No edema.  ?   Left lower leg: No edema.  ?Lymphadenopathy:  ?   Cervical: No cervical adenopathy.  ?Skin: ?   General: Skin is warm and dry.  ?   Findings: No rash.  ?Neurological:  ?   Mental Status: She is alert. Mental status is at baseline.  ?Psychiatric:     ?   Mood and Affect: Mood normal.     ?   Behavior: Behavior normal.  ? ?   ? ?Lab Results  ?Component Value Date  ? WBC 11.3 (H) 08/21/2021  ? HGB 10.7 (L) 08/21/2021  ? HCT 32.5 (L) 08/21/2021  ? PLT 328.0 08/21/2021  ? GLUCOSE 177 (H) 08/21/2021  ? CHOL 153 08/21/2021  ? TRIG 342.0 (H) 08/21/2021  ? HDL 40.40 08/21/2021  ? LDLDIRECT 59.0 08/21/2021  ? [place] 70 05/21/2020  ? ALT 13 08/21/2021  ? AST 18 08/21/2021  ? NA 139 08/21/2021  ? K 3.9 08/21/2021  ? CL 105 08/21/2021  ? CREATININE 2.59 (H) 08/21/2021  ? BUN 38 (H) 08/21/2021  ? CO2 25 08/21/2021  ? TSH 3.01 11/21/2020  ? INR 0.95 02/12/2017  ? HGBA1C 7.3 (A) 12/25/2021  ? HGBA1C 7.3 12/25/2021  ? HGBA1C 7.3 (A) 12/25/2021  ? HGBA1C 7.0 12/25/2021  ? MICROALBUR 194.0 Verified by manual dilution. (H) 11/16/2014  ? ? ? ?Assessment & Plan:  ? ? ?See Problem List for Assessment and Plan of chronic medical problems.  ? ? ?

## 2022-03-24 ENCOUNTER — Encounter: Payer: Self-pay | Admitting: Internal Medicine

## 2022-03-24 ENCOUNTER — Ambulatory Visit (INDEPENDENT_AMBULATORY_CARE_PROVIDER_SITE_OTHER): Payer: PPO | Admitting: Internal Medicine

## 2022-03-24 VITALS — BP 110/64 | HR 91 | Temp 98.2°F | Ht 64.0 in | Wt 156.0 lb

## 2022-03-24 DIAGNOSIS — N184 Chronic kidney disease, stage 4 (severe): Secondary | ICD-10-CM

## 2022-03-24 DIAGNOSIS — I10 Essential (primary) hypertension: Secondary | ICD-10-CM | POA: Diagnosis not present

## 2022-03-24 DIAGNOSIS — E1159 Type 2 diabetes mellitus with other circulatory complications: Secondary | ICD-10-CM

## 2022-03-24 DIAGNOSIS — E782 Mixed hyperlipidemia: Secondary | ICD-10-CM

## 2022-03-24 LAB — POCT GLYCOSYLATED HEMOGLOBIN (HGB A1C)
HbA1c POC (<> result, manual entry): 7 % (ref 4.0–5.6)
HbA1c, POC (controlled diabetic range): 7 % (ref 0.0–7.0)
HbA1c, POC (prediabetic range): 7 % — AB (ref 5.7–6.4)
Hemoglobin A1C: 7 % — AB (ref 4.0–5.6)

## 2022-03-24 NOTE — Assessment & Plan Note (Signed)
Chronic ?Regular exercise and healthy diet encouraged ?We will hold off on checking lipid panel since she is not getting blood work today for another reason ?Continue rosuvastatin 20 mg daily ?

## 2022-03-24 NOTE — Assessment & Plan Note (Addendum)
Chronic ?Blood pressure well controlled ?Continue amlodipine 5 mg daily, Labetalol 300 mg twice daily, losartan 100 mg daily ?

## 2022-03-24 NOTE — Patient Instructions (Addendum)
? ?  Your a1c was checked today. ? ? ?Medications changes include :  none  ? ? ? ? ? ?Return in about 6 months (around 09/24/2022) for follow up. ? ?

## 2022-03-24 NOTE — Assessment & Plan Note (Signed)
Chronic ?Lab Results  ?Component Value Date  ? HGBA1C 7.0 (A) 03/24/2022  ? HGBA1C 7.0 03/24/2022  ? HGBA1C 7.0 (A) 03/24/2022  ? HGBA1C 7.0 03/24/2022  ? ?Sugars fairly well controlled-sugars have continued to improve over the last few months ?Check A1c today ?Continue Trulicity 1.5 mg weekly, glipizide 5 mg in the morning and 10 mg in the evening ?Advised to monitor closely for low sugars ?Stressed regular exercise, diabetic diet ? ? ?

## 2022-03-25 DIAGNOSIS — D225 Melanocytic nevi of trunk: Secondary | ICD-10-CM | POA: Diagnosis not present

## 2022-03-25 DIAGNOSIS — L814 Other melanin hyperpigmentation: Secondary | ICD-10-CM | POA: Diagnosis not present

## 2022-03-25 DIAGNOSIS — L821 Other seborrheic keratosis: Secondary | ICD-10-CM | POA: Diagnosis not present

## 2022-03-25 DIAGNOSIS — Z85828 Personal history of other malignant neoplasm of skin: Secondary | ICD-10-CM | POA: Diagnosis not present

## 2022-03-25 DIAGNOSIS — L578 Other skin changes due to chronic exposure to nonionizing radiation: Secondary | ICD-10-CM | POA: Diagnosis not present

## 2022-03-28 ENCOUNTER — Other Ambulatory Visit: Payer: Self-pay | Admitting: Cardiovascular Disease

## 2022-04-02 ENCOUNTER — Other Ambulatory Visit: Payer: Self-pay

## 2022-04-02 DIAGNOSIS — N184 Chronic kidney disease, stage 4 (severe): Secondary | ICD-10-CM | POA: Diagnosis not present

## 2022-04-02 MED ORDER — AMLODIPINE BESYLATE 5 MG PO TABS: 5.0000 mg | ORAL_TABLET | Freq: Two times a day (BID) | ORAL | 2 refills | Status: DC

## 2022-04-09 ENCOUNTER — Telehealth: Payer: Self-pay

## 2022-04-09 NOTE — Telephone Encounter (Signed)
Pt is calling in requesting to speak to [facility] about Dulaglutide (TRULICITY) 1.5 ZE/0.9QZ SOPN.  CB [phone] please return call 04/10/22

## 2022-04-09 NOTE — Telephone Encounter (Signed)
Spoke with patient,   Reports that she may not be able continue trulicity when she refills next month, wondering if she would qualify for patient assistance   At this time trulicity is not accepting new applicants at this time, and patient did not tolerate rybelsus in the past, likely ozempic would not be tolerable for her as well  Suggested for patient to call and confirm trulicity cost with next refill.  Pt wants to avoid insulin if possible, due to her kidney function, oral options are limited. Discussed that glipizide dosing can go higher, but unsure if that higher doses would be able to keep BG controlled  Pt to monitor BG at least once daily, encouraged to check at different times through the day, patient to reach out should fasting BG average >150 or if post prandial BG average >180  Tomasa Blase, PharmD Clinical Pharmacist, [place]

## 2022-04-10 DIAGNOSIS — N2581 Secondary hyperparathyroidism of renal origin: Secondary | ICD-10-CM | POA: Diagnosis not present

## 2022-04-10 DIAGNOSIS — N184 Chronic kidney disease, stage 4 (severe): Secondary | ICD-10-CM | POA: Diagnosis not present

## 2022-04-10 DIAGNOSIS — N051 Unspecified nephritic syndrome with focal and segmental glomerular lesions: Secondary | ICD-10-CM | POA: Diagnosis not present

## 2022-04-10 DIAGNOSIS — D631 Anemia in chronic kidney disease: Secondary | ICD-10-CM | POA: Diagnosis not present

## 2022-04-10 DIAGNOSIS — I129 Hypertensive chronic kidney disease with stage 1 through stage 4 chronic kidney disease, or unspecified chronic kidney disease: Secondary | ICD-10-CM | POA: Diagnosis not present

## 2022-04-10 DIAGNOSIS — E1122 Type 2 diabetes mellitus with diabetic chronic kidney disease: Secondary | ICD-10-CM | POA: Diagnosis not present

## 2022-04-10 DIAGNOSIS — I714 Abdominal aortic aneurysm, without rupture, unspecified: Secondary | ICD-10-CM | POA: Diagnosis not present

## 2022-04-14 ENCOUNTER — Telehealth: Payer: Self-pay | Admitting: Internal Medicine

## 2022-04-14 ENCOUNTER — Other Ambulatory Visit: Payer: Self-pay | Admitting: Internal Medicine

## 2022-04-21 ENCOUNTER — Other Ambulatory Visit: Payer: Self-pay | Admitting: Internal Medicine

## 2022-04-21 ENCOUNTER — Telehealth: Payer: PPO

## 2022-05-17 ENCOUNTER — Other Ambulatory Visit: Payer: Self-pay | Admitting: Internal Medicine

## 2022-05-22 DIAGNOSIS — F1120 Opioid dependence, uncomplicated: Secondary | ICD-10-CM | POA: Diagnosis not present

## 2022-05-22 DIAGNOSIS — G894 Chronic pain syndrome: Secondary | ICD-10-CM | POA: Diagnosis not present

## 2022-05-22 DIAGNOSIS — Z9689 Presence of other specified functional implants: Secondary | ICD-10-CM | POA: Diagnosis not present

## 2022-06-14 ENCOUNTER — Other Ambulatory Visit: Payer: Self-pay | Admitting: Internal Medicine

## 2022-06-30 ENCOUNTER — Other Ambulatory Visit: Payer: Self-pay

## 2022-06-30 DIAGNOSIS — I714 Abdominal aortic aneurysm, without rupture, unspecified: Secondary | ICD-10-CM

## 2022-07-02 ENCOUNTER — Encounter: Payer: Self-pay | Admitting: Internal Medicine

## 2022-07-02 ENCOUNTER — Encounter: Payer: PPO | Admitting: Internal Medicine

## 2022-07-02 DIAGNOSIS — E1159 Type 2 diabetes mellitus with other circulatory complications: Secondary | ICD-10-CM

## 2022-07-02 DIAGNOSIS — N184 Chronic kidney disease, stage 4 (severe): Secondary | ICD-10-CM

## 2022-07-12 ENCOUNTER — Other Ambulatory Visit: Payer: Self-pay | Admitting: Internal Medicine

## 2022-07-16 ENCOUNTER — Other Ambulatory Visit: Payer: Self-pay | Admitting: Internal Medicine

## 2022-07-17 ENCOUNTER — Telehealth: Payer: Self-pay

## 2022-07-17 NOTE — Telephone Encounter (Signed)
Patient assistance completed today and faxed for patient.   Copy left up front for pick up.

## 2022-07-21 ENCOUNTER — Encounter: Payer: Self-pay | Admitting: Physician Assistant

## 2022-07-21 ENCOUNTER — Ambulatory Visit (INDEPENDENT_AMBULATORY_CARE_PROVIDER_SITE_OTHER): Payer: PPO | Admitting: Physician Assistant

## 2022-07-21 ENCOUNTER — Ambulatory Visit
Admission: RE | Admit: 2022-07-21 | Discharge: 2022-07-21 | Disposition: A | Discharge status: Home / Self Care | Payer: PPO | Source: Ambulatory Visit | Attending: Vascular Surgery | Admitting: Vascular Surgery

## 2022-07-21 VITALS — BP 177/82 | HR 93 | Temp 98.5°F | Ht 64.0 in | Wt 153.0 lb

## 2022-07-21 DIAGNOSIS — N200 Calculus of kidney: Secondary | ICD-10-CM | POA: Diagnosis not present

## 2022-07-21 DIAGNOSIS — J029 Acute pharyngitis, unspecified: Secondary | ICD-10-CM | POA: Diagnosis not present

## 2022-07-21 DIAGNOSIS — I714 Abdominal aortic aneurysm, without rupture, unspecified: Secondary | ICD-10-CM | POA: Diagnosis not present

## 2022-07-21 DIAGNOSIS — I10 Essential (primary) hypertension: Secondary | ICD-10-CM | POA: Diagnosis not present

## 2022-07-21 LAB — POC COVID19 BINAXNOW: SARS Coronavirus 2 Ag: NEGATIVE

## 2022-07-21 LAB — POCT INFLUENZA A/B
Influenza A, POC: NEGATIVE
Influenza B, POC: NEGATIVE

## 2022-07-21 LAB — POCT RAPID STREP A (OFFICE): Rapid Strep A Screen: NEGATIVE

## 2022-07-21 MED ORDER — DOXYCYCLINE HYCLATE 100 MG PO TABS: 100.0000 mg | ORAL_TABLET | Freq: Two times a day (BID) | ORAL | 0 refills | Status: DC

## 2022-07-21 NOTE — Patient Instructions (Signed)
It was great to see you!  COVID, strep and flu tests negative  Start doxycyline for your sinus infection  Keep an eye on your blood pressure and blood sugar and follow-up with your PCP if this remains abnormal.  Take care,  Inda Coke PA-C

## 2022-07-21 NOTE — Progress Notes (Signed)
Sheila Melton is a 81 y.o. female here for a follow up of a pre-existing problem.  History of Present Illness:   Chief Complaint  Patient presents with   Acute Visit    Pt c/o soar throat, ear ache and gums hurt x 1 week Asprin for symptoms    HPI  She is here with her husband, Sheila Melton.  Sinusitis Patient endorses symptoms for the past 3 weeks.  Current symptoms include: runny nose, eye watering, sore throat, left ear pain, and poor sleep from ear pain. Takes oxy for her back and this did not touch her ear pain.  She also tried some Tylenol without relief.  Husband has been sick as well.  He saw Harland Dingwall and was given cough syrup and perles.  Was told that he had a viral illness.  She denies: Chest pain, shortness of breath, lower extremity swelling, fever, chills, malaise  HTN Currently taking losartan 100 mg daily, amlodipine 5 mg twice daily, labetalol 300 mg twice daily.  At home blood pressure readings are: Overall normal but have been elevated today. Patient denies chest pain, SOB, blurred vision, dizziness, unusual headaches, lower leg swelling. Patient is compliant with medication. Denies excessive caffeine intake, stimulant usage, excessive alcohol intake, or increase in salt consumption.  BP Readings from Last 3 Encounters:  07/21/22 (!) 177/82  03/24/22 110/64  01/28/22 111/68     Past Medical History:  Diagnosis Date   AAA (abdominal aortic aneurysm) (HCC)    BEING WATCHED   VVS. 4.2 cm infrarenal abdominal aortic aneurysm without rupture noted 11/08/2016   Arthritis    Cancer ([place])    skin cancer removed from left shoulder  2017   CAP (community acquired pneumonia) 09/02/13-11/05/13    with SIRS   Depression    ?? from prednisone    not on pred now   Diabetes mellitus    type ii    long time ago.   Family history of anesthesia complication    PATIENTS MOM HAD TROUBLE WAKING UP    GERD (gastroesophageal reflux disease)    will take occasional tums   Heart  murmur    still has.   Sees Dr. Stanford Breed   History of kidney stones    has them at the present time   Hyperlipidemia    Hypertension    unspecified essential   Renal insufficiency    "FSGS"   Subclavian steal syndrome    LEFT SIDE   NO SIDE EFFECTS   Subclavian steal syndrome --  left    85% blocked   UTI (lower urinary tract infection) 08/26/13   Enterococcus and Escherichia coli both sensitive to nitrofurantoin     Social History   Tobacco Use   Smoking status: Light Smoker    Packs/day: 1.00    Years: 50.00    Total pack years: 50.00    Types: E-cigarettes, Cigarettes   Smokeless tobacco: Never   Tobacco comments:    e-cigs  Vaping Use   Vaping Use: Never used  Substance Use Topics   Alcohol use: No   Drug use: No    Past Surgical History:  Procedure Laterality Date   ABDOMINAL HYSTERECTOMY     BACK SURGERY     CATARACT EXTRACTION     OS   CYST EXCISION Right 05-23-13   Right index finger: cyst   DILATION AND CURETTAGE OF UTERUS     EYE SURGERY     g1 p1  SPINE SURGERY  Sept. 2013   VIDEO ASSISTED THORACOSCOPY (VATS)/ LOBECTOMY Right 02/16/2017   Procedure: Right VIDEO ASSISTED THORACOSCOPY (VATS)/ Right Middle LOBECTOMY;  Surgeon: [name], MD;  Location: [facility];  Service: Thoracic;  Laterality: Right;    Family History  Problem Relation Age of Onset   Heart failure Mother    Heart disease Mother    Hypertension Mother    Other Mother        varicose veins   Deep vein thrombosis Mother    Varicose Veins Mother    Heart attack Mother    Hypertension Sister        X 2   Diabetes Sister    Hyperlipidemia Sister    Other Sister        varicose veins   Heart disease Father    Diabetes Father    Hyperlipidemia Father    Hypertension Father    Heart attack Father    Heart disease Brother        MI in late 85s   Hyperlipidemia Brother    Heart attack Brother    Coronary artery disease Other    Diabetes Other    Parkinsonism Brother     Heart failure Brother    Hypertension Daughter     Allergies  Allergen Reactions   Rocephin [Ceftriaxone Sodium In Dextrose] Dermatitis and Rash    10/24 /14 blisters of palms & diffuse rash Because of a history of documented adverse serious drug reaction;Medi Alert bracelet  is recommended   Amoxicillin Rash    Has patient had a PCN reaction causing IMMEDIATE RASH, FACIAL/TONGUE/THROAT SWELLING, SOB, OR LIGHTHEADEDNESS WITH HYPOTENSION:  #  #  #  YES  #  #  #  Has patient had a PCN reaction causing severe rash involving mucus membranes or skin necrosis: No Has patient had a PCN reaction that required hospitalization No Has patient had a PCN reaction occurring within the last 10 years: No If all of the above answers are "NO", then may proceed with Cephalosporin use.    Captopril Cough   Simvastatin Other (See Comments)    MYALGIAS BUTTOCKS CRAMP   Ciprofloxacin Other (See Comments)    Sores in mouth    Semaglutide Nausea And Vomiting   Adhesive [Tape] Rash   Latex Rash    Patient states she gets red and a rash when latex touches her.    Current Medications:   Current Outpatient Medications:    amLODipine (NORVASC) 5 MG tablet, Take 1 tablet (5 mg total) by mouth 2 (two) times daily. Schedule an appointment for further refills, Disp: 120 tablet, Rfl: 2   ascorbic Acid (VITAMIN C) 500 MG CPCR, 1 cap PO QD, Disp: , Rfl:    aspirin EC 81 MG tablet, Take 81 mg by mouth daily. Swallow whole., Disp: , Rfl:    B Complex-C (SUPER B COMPLEX PO), Take 1 tablet by mouth daily. , Disp: , Rfl:    Blood Glucose Monitoring Suppl (ONE TOUCH ULTRA 2) w/Device KIT, Use as advised, Disp: 1 each, Rfl: 0   calcium carbonate (TUMS - DOSED IN MG ELEMENTAL CALCIUM) 500 MG chewable tablet, Chew 1 tablet by mouth daily as needed for indigestion or heartburn., Disp: , Rfl:    Cholecalciferol (VITAMIN D3) 125 MCG (5000 UT) TABS, Take 5,000 Units by mouth daily., Disp: , Rfl:    diazepam (VALIUM) 5 MG  tablet, TAKE ONE TABLET BY MOUTH AT BEDTIME AS NEEDED., Disp: 30  tablet, Rfl: 2   doxycycline (VIBRA-TABS) 100 MG tablet, Take 1 tablet (100 mg total) by mouth 2 (two) times daily., Disp: 14 tablet, Rfl: 0   fexofenadine (ALLEGRA) 180 MG tablet, Take 180 mg by mouth at bedtime., Disp: , Rfl:    FLUoxetine (PROZAC) 10 MG capsule, TAKE ONE CAPSULE BY MOUTH DAILY, Disp: 90 capsule, Rfl: 2   furosemide (LASIX) 40 MG tablet, TAKE ONE-HALF TO ONE TABLET BY MOUTH DAILY AS NEEDED FOR IN FLUID., Disp: 90 tablet, Rfl: 0   glipiZIDE (GLUCOTROL) 5 MG tablet, TAKE ONE TABLET BY MOUTH EVERY MORNING AND TWO TABLETS IN THE EVENING, Disp: 270 tablet, Rfl: 0   KERENDIA 10 MG TABS, Take 1 tablet by mouth daily., Disp: , Rfl:    labetalol (NORMODYNE) 300 MG tablet, Take 1 tablet (300 mg total) by mouth 2 (two) times daily., Disp: , Rfl:    Liniments (SALONPAS PAIN RELIEF PATCH EX), Apply 1 patch topically daily as needed (back pain)., Disp: , Rfl:    losartan (COZAAR) 100 MG tablet, TAKE ONE TABLET BY MOUTH DAILY, Disp: 90 tablet, Rfl: 0   ONETOUCH ULTRA test strip, CHECK BLOOD SUGAR TWICE DAILY., Disp: 100 strip, Rfl: 0   oxyCODONE-acetaminophen (PERCOCET/ROXICET) 5-325 MG tablet, Take 1 tablet by mouth 3 (three) times daily as needed., Disp: , Rfl:    PREVIDENT 5000 DRY MOUTH 1.1 % GEL dental gel, Place onto teeth., Disp: , Rfl:    rosuvastatin (CRESTOR) 20 MG tablet, TAKE ONE TABLET BY MOUTH DAILY, Disp: 90 tablet, Rfl: 0   Triamcinolone Acetonide (NASACORT ALLERGY 24HR NA), Place 1 spray into the nose 2 (two) times daily as needed (congestion)., Disp: , Rfl:    TRULICITY 1.5 HQ/7.5FF SOPN, Inject 1.5 mg into the skin once a week., Disp: 2 mL, Rfl: 3   vitamin B-12 (CYANOCOBALAMIN) 1000 MCG tablet, Take 1,000 mcg by mouth daily., Disp: , Rfl:    Vitamin E 180 MG CAPS, Take 180 mg by mouth daily., Disp: , Rfl:    Review of Systems:   ROS Negative unless otherwise specified per HPI.  Vitals:   Vitals:    07/21/22 1522 07/21/22 1639  BP: (!) 170/76 (!) 177/82  Pulse: 93   Temp: 98.5 F (36.9 C)   TempSrc: Temporal   SpO2: 97%   Weight: 153 lb (69.4 kg)   Height: _0  (1.626 m)      Body mass index is 26.26 kg/m.  Physical Exam:   Physical Exam Vitals and nursing note reviewed.  Constitutional:      General: She is not in acute distress.    Appearance: She is well-developed. She is not ill-appearing or toxic-appearing.  HENT:     Head: Normocephalic and atraumatic.     Right Ear: Tympanic membrane, ear canal and external ear normal. Tympanic membrane is not erythematous, retracted or bulging.     Left Ear: Tympanic membrane, ear canal and external ear normal. There is impacted cerumen.     Nose: Nose normal.     Right Sinus: No maxillary sinus tenderness or frontal sinus tenderness.     Left Sinus: No maxillary sinus tenderness or frontal sinus tenderness.     Mouth/Throat:     Pharynx: Uvula midline. No posterior oropharyngeal erythema.  Eyes:     General: Lids are normal.     Conjunctiva/sclera: Conjunctivae normal.  Neck:     Trachea: Trachea normal.  Cardiovascular:     Rate and Rhythm: Normal rate and regular rhythm.  Pulses: Normal pulses.     Heart sounds: Normal heart sounds, S1 normal and S2 normal.  Pulmonary:     Effort: Pulmonary effort is normal.     Breath sounds: Normal breath sounds. No decreased breath sounds, wheezing, rhonchi or rales.  Lymphadenopathy:     Cervical: No cervical adenopathy.  Skin:    General: Skin is warm and dry.  Neurological:     Mental Status: She is alert.     GCS: GCS eye subscore is 4. GCS verbal subscore is 5. GCS motor subscore is 6.  Psychiatric:        Speech: Speech normal.        Behavior: Behavior normal. Behavior is cooperative.    Results for orders placed or performed in visit on 07/21/22  POCT rapid strep A  Result Value Ref Range   Rapid Strep A Screen Negative Negative  POCT Influenza A/B  Result Value  Ref Range   Influenza A, POC Negative Negative   Influenza B, POC Negative Negative  POC COVID-19  Result Value Ref Range   SARS Coronavirus 2 Ag Negative Negative     Assessment and Plan:   Sore throat No red flags on exam All point-of-care testing was negative She does have significant left ear pain however I am unable to see her tympanic membrane due to the amount of wax that she has I do not think that she can tolerate an ear lavage today We will treat for sinusitis with doxycycline 100 mg twice daily x7 days I recommend close follow-up with her PCP if symptoms worsen or do not improve  Essential hypertension Remains above goal today Continue losartan 100 mg daily, amlodipine 5 mg twice daily, labetalol 300 mg twice daily She is going to keep an eye on this No evidence of endorgan damage on my exam She knows if she develops any chest pain, SOB, swelling or other concerning symptoms to reach out to her PCP or go to the ER     Inda Coke, PA-C

## 2022-07-21 NOTE — Progress Notes (Signed)
poc

## 2022-07-29 ENCOUNTER — Ambulatory Visit: Payer: PPO | Admitting: Vascular Surgery

## 2022-07-29 ENCOUNTER — Encounter: Payer: Self-pay | Admitting: Vascular Surgery

## 2022-07-29 VITALS — BP 122/68 | HR 89 | Temp 97.8°F | Resp 14 | Ht 64.0 in | Wt 152.0 lb

## 2022-07-29 DIAGNOSIS — I7143 Infrarenal abdominal aortic aneurysm, without rupture: Secondary | ICD-10-CM

## 2022-07-29 NOTE — Progress Notes (Signed)
Patient name: Sheila Melton MRN: [id number] DOB: Oct 21, 1941 Sex: female  REASON FOR VISIT: 6 month follow-up for AAA  HPI: Sheila Melton is a 81 y.o. female with hx chronic back pain with spinal stimulator, HTN, HLD, stage IV CKD, DM that presents for 6 month follow-up of her AAA.  Her aneurysm has been followed by Dr. Donnetta Hutching.  She last saw Dr. Donnetta Hutching 07/10/21 with a 5.0 cm AAA.  He recommended follow-up with me.  Her AAA last measured 5.3 cm 6 months ago.  She presents for follow-up with a non-contrast CT scan today.  She has no new abdominal pain except for chronic back pain.  She was previously told she had very poor access vessels for repair with endograft.  States she is sedentary and sits all day.  Past Medical History:  Diagnosis Date   AAA (abdominal aortic aneurysm) ([place])    BEING WATCHED   VVS. 4.2 cm infrarenal abdominal aortic aneurysm without rupture noted 11/08/2016   Arthritis    Cancer ([place])    skin cancer removed from left shoulder  2017   CAP (community acquired pneumonia) 09/02/13-11/05/13    with SIRS   Depression    ?? from prednisone    not on pred now   Diabetes mellitus    type ii    long time ago.   Family history of anesthesia complication    PATIENTS MOM HAD TROUBLE WAKING UP    GERD (gastroesophageal reflux disease)    will take occasional tums   Heart murmur    still has.   Sees Dr. Stanford Breed   History of kidney stones    has them at the present time   Hyperlipidemia    Hypertension    unspecified essential   Renal insufficiency    "FSGS"   Subclavian steal syndrome    LEFT SIDE   NO SIDE EFFECTS   Subclavian steal syndrome --  left    85% blocked   UTI (lower urinary tract infection) 08/26/13   Enterococcus and Escherichia coli both sensitive to nitrofurantoin    Past Surgical History:  Procedure Laterality Date   ABDOMINAL HYSTERECTOMY     BACK SURGERY     CATARACT EXTRACTION     OS   CYST EXCISION Right 05-23-13   Right index finger: cyst    DILATION AND CURETTAGE OF UTERUS     EYE SURGERY     g1 p1     SPINE SURGERY  Sept. 2013   VIDEO ASSISTED THORACOSCOPY (VATS)/ LOBECTOMY Right 02/16/2017   Procedure: Right VIDEO ASSISTED THORACOSCOPY (VATS)/ Right Middle LOBECTOMY;  Surgeon: [name], MD;  Location: [facility];  Service: Thoracic;  Laterality: Right;    Family History  Problem Relation Age of Onset   Heart failure Mother    Heart disease Mother    Hypertension Mother    Other Mother        varicose veins   Deep vein thrombosis Mother    Varicose Veins Mother    Heart attack Mother    Hypertension Sister        X 2   Diabetes Sister    Hyperlipidemia Sister    Other Sister        varicose veins   Heart disease Father    Diabetes Father    Hyperlipidemia Father    Hypertension Father    Heart attack Father    Heart disease Brother  MI in late 67s   Hyperlipidemia Brother    Heart attack Brother    Coronary artery disease Other    Diabetes Other    Parkinsonism Brother    Heart failure Brother    Hypertension Daughter     SOCIAL HISTORY: Social History   Tobacco Use   Smoking status: Light Smoker    Packs/day: 1.00    Years: 50.00    Total pack years: 50.00    Types: E-cigarettes, Cigarettes   Smokeless tobacco: Never   Tobacco comments:    e-cigs  Substance Use Topics   Alcohol use: No    Allergies  Allergen Reactions   Rocephin [Ceftriaxone Sodium In Dextrose] Dermatitis and Rash    10/24 /14 blisters of palms & diffuse rash Because of a history of documented adverse serious drug reaction;Medi Alert bracelet  is recommended   Amoxicillin Rash    Has patient had a PCN reaction causing IMMEDIATE RASH, FACIAL/TONGUE/THROAT SWELLING, SOB, OR LIGHTHEADEDNESS WITH HYPOTENSION:  #  #  #  YES  #  #  #  Has patient had a PCN reaction causing severe rash involving mucus membranes or skin necrosis: No Has patient had a PCN reaction that required hospitalization No Has patient had a PCN  reaction occurring within the last 10 years: No If all of the above answers are "NO", then may proceed with Cephalosporin use.    Captopril Cough   Simvastatin Other (See Comments)    MYALGIAS BUTTOCKS CRAMP   Ciprofloxacin Other (See Comments)    Sores in mouth    Semaglutide Nausea And Vomiting   Adhesive [Tape] Rash   Latex Rash    Patient states she gets red and a rash when latex touches her.    Current Outpatient Medications  Medication Sig Dispense Refill   amLODipine (NORVASC) 5 MG tablet Take 1 tablet (5 mg total) by mouth 2 (two) times daily. Schedule an appointment for further refills 120 tablet 2   ascorbic Acid (VITAMIN C) 500 MG CPCR 1 cap PO QD     aspirin EC 81 MG tablet Take 81 mg by mouth daily. Swallow whole.     B Complex-C (SUPER B COMPLEX PO) Take 1 tablet by mouth daily.      Blood Glucose Monitoring Suppl (ONE TOUCH ULTRA 2) w/Device KIT Use as advised 1 each 0   calcium carbonate (TUMS - DOSED IN MG ELEMENTAL CALCIUM) 500 MG chewable tablet Chew 1 tablet by mouth daily as needed for indigestion or heartburn.     Cholecalciferol (VITAMIN D3) 125 MCG (5000 UT) TABS Take 5,000 Units by mouth daily.     diazepam (VALIUM) 5 MG tablet TAKE ONE TABLET BY MOUTH AT BEDTIME AS NEEDED. 30 tablet 2   fexofenadine (ALLEGRA) 180 MG tablet Take 180 mg by mouth at bedtime.     FLUoxetine (PROZAC) 10 MG capsule TAKE ONE CAPSULE BY MOUTH DAILY 90 capsule 2   furosemide (LASIX) 40 MG tablet TAKE ONE-HALF TO ONE TABLET BY MOUTH DAILY AS NEEDED FOR IN FLUID. 90 tablet 0   glipiZIDE (GLUCOTROL) 5 MG tablet TAKE ONE TABLET BY MOUTH EVERY MORNING AND TWO TABLETS IN THE EVENING 270 tablet 0   KERENDIA 10 MG TABS Take 1 tablet by mouth daily.     labetalol (NORMODYNE) 300 MG tablet Take 1 tablet (300 mg total) by mouth 2 (two) times daily.     Liniments (SALONPAS PAIN RELIEF PATCH EX) Apply 1 patch topically daily as needed (  back pain).     losartan (COZAAR) 100 MG tablet TAKE ONE  TABLET BY MOUTH DAILY 90 tablet 0   ONETOUCH ULTRA test strip CHECK BLOOD SUGAR TWICE DAILY. 100 strip 0   oxyCODONE-acetaminophen (PERCOCET/ROXICET) 5-325 MG tablet Take 1 tablet by mouth 3 (three) times daily as needed.     PREVIDENT 5000 DRY MOUTH 1.1 % GEL dental gel Place onto teeth.     rosuvastatin (CRESTOR) 20 MG tablet TAKE ONE TABLET BY MOUTH DAILY 90 tablet 0   Triamcinolone Acetonide (NASACORT ALLERGY 24HR NA) Place 1 spray into the nose 2 (two) times daily as needed (congestion).     TRULICITY 1.5 GL/8.7FI SOPN Inject 1.5 mg into the skin once a week. 2 mL 3   vitamin B-12 (CYANOCOBALAMIN) 1000 MCG tablet Take 1,000 mcg by mouth daily.     Vitamin E 180 MG CAPS Take 180 mg by mouth daily.     doxycycline (VIBRA-TABS) 100 MG tablet Take 1 tablet (100 mg total) by mouth 2 (two) times daily. (Patient not taking: Reported on 07/29/2022) 14 tablet 0   No current facility-administered medications for this visit.    REVIEW OF SYSTEMS:  _0  denotes positive finding, _1  denotes negative finding Cardiac  Comments:  Chest pain or chest pressure:    Shortness of breath upon exertion:    Short of breath when lying flat:    Irregular heart rhythm:        Vascular    Pain in calf, thigh, or hip brought on by ambulation:    Pain in feet at night that wakes you up from your sleep:     Blood clot in your veins:    Leg swelling:         Pulmonary    Oxygen at home:    Productive cough:     Wheezing:         Neurologic    Sudden weakness in arms or legs:     Sudden numbness in arms or legs:     Sudden onset of difficulty speaking or slurred speech:    Temporary loss of vision in one eye:     Problems with dizziness:         Gastrointestinal    Blood in stool:     Vomited blood:         Genitourinary    Burning when urinating:     Blood in urine:        Psychiatric    Major depression:         Hematologic    Bleeding problems:    Problems with blood clotting too easily:         Skin    Rashes or ulcers:        Constitutional    Fever or chills:      PHYSICAL EXAM: Vitals:   07/29/22 0959  BP: 122/68  Pulse: 89  Resp: 14  Temp: 97.8 F (36.6 C)  TempSrc: Temporal  SpO2: 96%  Weight: 152 lb (68.9 kg)  Height: _2  (1.626 m)    GENERAL: The patient is a well-nourished female, in no acute distress. The vital signs are documented above. CARDIAC: There is a regular rate and rhythm.  VASCULAR:  1+ palpable femoral pulses bilaterally No palpable pedal pulses PULMONARY: No respiratory distress. ABDOMEN: Soft and non-tender.  No pain with palpation of aneurysm. MUSCULOSKELETAL: There are no major deformities or cyanosis. NEUROLOGIC: No focal weakness or paresthesias are detected.  PSYCHIATRIC: The patient has a normal affect.  DATA:   CT abdomen pelvis reviewed and by my measurement her AAA has increased from 5.3 to 5.4 cm over 6 months. Assessment/Plan:  81 year old female with multiple comorbidities including stage IV chronic kidney disease that presents for evaluation and continued surveillance of her 5.3 cm abdominal aortic aneurysm.  I reviewed with her and her husband that her aneurysm has increased slightly from 5.3 to 5.4 cm over the last 6 months.  I again reviewed that we would typically recommend repair at greater than 5 cm in women.  Discussed standard of care with stent grafting but unfortunately she has very small and heavily calcified access vessels that I think would make stent graft repair not an option.  Discussed the option of an iliac conduit but her iliacs are nearly circumferential calcified and I do not think there would be a way to sew a conduit.  I think she would likely require open repair of her AAA.  I discussed current risk of rupture is probably in the 5 to 10% range.  Discussed with open AAA repair she likely faces a more than 30% risk of major complication including possible death and dialysis.  Ultimately we discussed all  of these options and she has elected for continued surveillance knowing there would be some risk for rupture with continued surveillance.  I will see her again in 6 months with a CT abdomen pelvis without contrast.  I have also discussed her case with Dr. Donnetta Hutching in the past who is in agreement for continued surveillance at this time.   Marty Heck, MD [facility] Office: [phone]

## 2022-08-07 DIAGNOSIS — Z9689 Presence of other specified functional implants: Secondary | ICD-10-CM | POA: Diagnosis not present

## 2022-08-07 DIAGNOSIS — G894 Chronic pain syndrome: Secondary | ICD-10-CM | POA: Diagnosis not present

## 2022-08-07 DIAGNOSIS — F1120 Opioid dependence, uncomplicated: Secondary | ICD-10-CM | POA: Diagnosis not present

## 2022-08-09 NOTE — Progress Notes (Unsigned)
Subjective:    Patient ID: Sheila Melton, female    DOB: May 17, 1941, 81 y.o.   MRN: [id number]      HPI Ever is here for No chief complaint on file.   9/11 - seen for sore throat, left ear ache, gum pain x 1 week.  Prescribed doxy x 7 days     Medications and allergies reviewed with patient and updated if appropriate.  Current Outpatient Medications on File Prior to Visit  Medication Sig Dispense Refill   amLODipine (NORVASC) 5 MG tablet Take 1 tablet (5 mg total) by mouth 2 (two) times daily. Schedule an appointment for further refills 120 tablet 2   ascorbic Acid (VITAMIN C) 500 MG CPCR 1 cap PO QD     aspirin EC 81 MG tablet Take 81 mg by mouth daily. Swallow whole.     B Complex-C (SUPER B COMPLEX PO) Take 1 tablet by mouth daily.      Blood Glucose Monitoring Suppl (ONE TOUCH ULTRA 2) w/Device KIT Use as advised 1 each 0   calcium carbonate (TUMS - DOSED IN MG ELEMENTAL CALCIUM) 500 MG chewable tablet Chew 1 tablet by mouth daily as needed for indigestion or heartburn.     Cholecalciferol (VITAMIN D3) 125 MCG (5000 UT) TABS Take 5,000 Units by mouth daily.     diazepam (VALIUM) 5 MG tablet TAKE ONE TABLET BY MOUTH AT BEDTIME AS NEEDED. 30 tablet 2   doxycycline (VIBRA-TABS) 100 MG tablet Take 1 tablet (100 mg total) by mouth 2 (two) times daily. (Patient not taking: Reported on 07/29/2022) 14 tablet 0   fexofenadine (ALLEGRA) 180 MG tablet Take 180 mg by mouth at bedtime.     FLUoxetine (PROZAC) 10 MG capsule TAKE ONE CAPSULE BY MOUTH DAILY 90 capsule 2   furosemide (LASIX) 40 MG tablet TAKE ONE-HALF TO ONE TABLET BY MOUTH DAILY AS NEEDED FOR IN FLUID. 90 tablet 0   glipiZIDE (GLUCOTROL) 5 MG tablet TAKE ONE TABLET BY MOUTH EVERY MORNING AND TWO TABLETS IN THE EVENING 270 tablet 0   KERENDIA 10 MG TABS Take 1 tablet by mouth daily.     labetalol (NORMODYNE) 300 MG tablet Take 1 tablet (300 mg total) by mouth 2 (two) times daily.     Liniments (SALONPAS PAIN RELIEF PATCH EX)  Apply 1 patch topically daily as needed (back pain).     losartan (COZAAR) 100 MG tablet TAKE ONE TABLET BY MOUTH DAILY 90 tablet 0   ONETOUCH ULTRA test strip CHECK BLOOD SUGAR TWICE DAILY. 100 strip 0   oxyCODONE-acetaminophen (PERCOCET/ROXICET) 5-325 MG tablet Take 1 tablet by mouth 3 (three) times daily as needed.     PREVIDENT 5000 DRY MOUTH 1.1 % GEL dental gel Place onto teeth.     rosuvastatin (CRESTOR) 20 MG tablet TAKE ONE TABLET BY MOUTH DAILY 90 tablet 0   Triamcinolone Acetonide (NASACORT ALLERGY 24HR NA) Place 1 spray into the nose 2 (two) times daily as needed (congestion).     TRULICITY 1.5 IW/8.0HO SOPN Inject 1.5 mg into the skin once a week. 2 mL 3   vitamin B-12 (CYANOCOBALAMIN) 1000 MCG tablet Take 1,000 mcg by mouth daily.     Vitamin E 180 MG CAPS Take 180 mg by mouth daily.     No current facility-administered medications on file prior to visit.    Review of Systems     Objective:  There were no vitals filed for this visit. BP Readings from Last 3  Encounters:  07/29/22 122/68  07/21/22 (!) 177/82  03/24/22 110/64   Wt Readings from Last 3 Encounters:  07/29/22 152 lb (68.9 kg)  07/21/22 153 lb (69.4 kg)  03/24/22 156 lb (70.8 kg)   There is no height or weight on file to calculate BMI.    Physical Exam         Assessment & Plan:    See Problem List for Assessment and Plan of chronic medical problems.

## 2022-08-11 ENCOUNTER — Ambulatory Visit (INDEPENDENT_AMBULATORY_CARE_PROVIDER_SITE_OTHER): Payer: PPO | Admitting: Internal Medicine

## 2022-08-11 ENCOUNTER — Encounter: Payer: Self-pay | Admitting: Internal Medicine

## 2022-08-11 VITALS — BP 128/60 | HR 81 | Temp 98.1°F | Ht 64.0 in | Wt 154.0 lb

## 2022-08-11 DIAGNOSIS — E1159 Type 2 diabetes mellitus with other circulatory complications: Secondary | ICD-10-CM | POA: Diagnosis not present

## 2022-08-11 DIAGNOSIS — H9192 Unspecified hearing loss, left ear: Secondary | ICD-10-CM | POA: Insufficient documentation

## 2022-08-11 DIAGNOSIS — N184 Chronic kidney disease, stage 4 (severe): Secondary | ICD-10-CM | POA: Diagnosis not present

## 2022-08-11 DIAGNOSIS — I10 Essential (primary) hypertension: Secondary | ICD-10-CM

## 2022-08-11 NOTE — Assessment & Plan Note (Signed)
Chronic Sugars have been well controlled at home Trulicity is expensive-does not tolerate semaglutide, so only other option would be Mounjaro if she can get assistance-we will discuss with [facility] pharmacist Continue glipizide 5 mg in the morning, 10 mg in the evening-would like to avoid increasing this further due to possible hypoglycemia

## 2022-08-11 NOTE — Assessment & Plan Note (Signed)
Acute Was seen 9/11 for cold symptoms and at that time did have left ear pain-prescribed doxycycline for 7 days and that did help with the cold symptoms At some point she noticed that she does not hear well out of the left ear Has cleaned out the ear of wax and on exam there is no wax or erythema Possible sensorineural hearing loss, less likely eustachian tube dysfunction Continue Nasacort Referral to ENT

## 2022-08-11 NOTE — Patient Instructions (Signed)
     Medications changes include :   none     A referral was ordered for ENT.     Someone from that office will call you to schedule an appointment.

## 2022-08-11 NOTE — Assessment & Plan Note (Signed)
Chronic Blood pressure well controlled Continue amlodipine 5 mg daily, labetalol 300 mg twice daily and losartan 100 mg daily

## 2022-08-12 ENCOUNTER — Telehealth: Payer: Self-pay

## 2022-08-12 NOTE — Patient Outreach (Signed)
  Care Coordination   Initial Visit Note   08/12/2022 Name: Sheila Melton MRN: [id number] DOB: [birth year]  Sheila Melton is a 81 y.o. year old female who sees Burns, Claudina Lick, MD for primary care. I spoke with  [name] by phone today.  What matters to the patients health and wellness today?  Ms. Slovacek reports she is in the "donut hole" and pays $274 for a 4 week supply of Trulicity. She states she has been in the donut hole since July. She states she has her medications and continues to pay out of pocket. She reports she has placed an application for assistance and has been informed that they are no longer providing assistance at this time. She also reports she has spoken with pharmacist through her insurance provider and received the same information. She would like to hold off of on a pharmacy referral from Care Coordinator for medication assistance stating that she would like to call back to the manufacturing company to follow up. She expresses office visit with PCP on yesterday regarding ear feeling stopped up. She is awaiting follow up call regarding "ear doctor".     Goals Addressed             This Visit's Progress    Community resources       Care Coordination Interventions: Discussed care coordination program Discussed referral to pharmacy for assistance with medication Trulicity Reviewed upcoming appointments SDOH completed. Confirmed patient has transportation to appointments Encouraged patient to contact care coordinator if she changes her mind about pharmacy referral or with other care coordination needs. Provided care coordinator's contact number.       SDOH assessments and interventions completed:  Yes  SDOH Interventions Today    Flowsheet Row Most Recent Value  SDOH Interventions   Food Insecurity Interventions Intervention Not Indicated  Housing Interventions Intervention Not Indicated  Transportation Interventions Intervention Not Indicated  Utilities  Interventions Intervention Not Indicated     Care Coordination Interventions Activated:  Yes  Care Coordination Interventions:  Yes, provided   Follow up plan: Follow up call scheduled for 09/23/22    Encounter Outcome:  Pt. Visit Completed   Thea Silversmith, RN, MSN, BSN, [place]  Coordinator [phone]

## 2022-08-12 NOTE — Patient Instructions (Addendum)
Visit Information  Thank you for taking time to visit with me today. Please don't hesitate to contact me if I can be of assistance to you.   Following are the goals we discussed today:   Goals Addressed             This Visit's Progress    Community resources       Care Coordination Interventions: Discussed care coordination program Discussed referral to pharmacy for assistance with medication Trulicity Reviewed upcoming appointments SDOH completed. Confirmed patient has transportation to appointments Encouraged patient to contact care coordinator if she changes her mind about pharmacy referral or with other care coordination needs. Provided care coordinator's contact number.       Our next appointment is by telephone on 09/23/22 at 1:15 pm  Please call the care guide team at [phone] if you need to cancel or reschedule your appointment.   If you are experiencing a Mental Health or [place] or need someone to talk to, please call the Suicide and Crisis Lifeline: 988  Patient verbalizes understanding of instructions and care plan provided today and agrees to view in [facility]. Active MyChart status and patient understanding of how to access instructions and care plan via MyChart confirmed with patient.     Thea Silversmith, RN, MSN, BSN, [place] Coordinator [phone]

## 2022-08-20 DIAGNOSIS — I129 Hypertensive chronic kidney disease with stage 1 through stage 4 chronic kidney disease, or unspecified chronic kidney disease: Secondary | ICD-10-CM | POA: Diagnosis not present

## 2022-08-20 DIAGNOSIS — N2581 Secondary hyperparathyroidism of renal origin: Secondary | ICD-10-CM | POA: Diagnosis not present

## 2022-08-20 DIAGNOSIS — D631 Anemia in chronic kidney disease: Secondary | ICD-10-CM | POA: Diagnosis not present

## 2022-08-20 DIAGNOSIS — E1122 Type 2 diabetes mellitus with diabetic chronic kidney disease: Secondary | ICD-10-CM | POA: Diagnosis not present

## 2022-08-20 DIAGNOSIS — N184 Chronic kidney disease, stage 4 (severe): Secondary | ICD-10-CM | POA: Diagnosis not present

## 2022-08-25 ENCOUNTER — Encounter: Payer: Self-pay | Admitting: Physician Assistant

## 2022-08-25 ENCOUNTER — Ambulatory Visit (INDEPENDENT_AMBULATORY_CARE_PROVIDER_SITE_OTHER): Payer: PPO | Admitting: Physician Assistant

## 2022-08-25 VITALS — BP 120/66 | HR 95 | Temp 97.5°F | Ht 64.0 in | Wt 155.2 lb

## 2022-08-25 DIAGNOSIS — J029 Acute pharyngitis, unspecified: Secondary | ICD-10-CM

## 2022-08-25 LAB — POCT RAPID STREP A (OFFICE): Rapid Strep A Screen: POSITIVE — AB

## 2022-08-25 MED ORDER — AZITHROMYCIN 250 MG PO TABS: ORAL_TABLET | ORAL | 0 refills | Status: AC

## 2022-08-25 NOTE — Patient Instructions (Signed)
It was great to see you!  Z-pack for your strep test  If your vertigo returns, follow-up with your PCP, Dr Quay Burow. Avoid clearing your ears with hard pressure again.  Follow-up as needed.

## 2022-08-25 NOTE — Progress Notes (Signed)
Sheila Melton is a 81 y.o. female here for a new problem.  History of Present Illness:   Chief Complaint  Patient presents with   Sore Throat    Pt c/o sore throat x 7 weeks and bilateral ear pain. Pt was seen on 9/7 and Treated with Doxy for 7 days, was only good for few days and then came back.    HPI  Sore Throat September 11, she was prescribed doxycycline hyclate 100 mg for a sore throat and was better for a week then began to feel sick ever since with the same symptoms. She is not taking any OTC medications to relieve symptoms besides tylenol.  She reports that she recently saw her PCP for her ear pain on 10-23 and was told that she had a normal exam and needed an ENT referral.  Yesterday, she states that she almost fainted after closing nose shut and holding breathe and blowing in an attempt to clear her ears. Reports that she vomited and had diarrhea right after. Following this episode, pain from the left ear moved to the right. She denies blurred vision and changed vision.  All of her symptoms have resolved in regards to the vertigo, diarrhea, vomiting.  Denies any vision changes.  Past Medical History:  Diagnosis Date   AAA (abdominal aortic aneurysm) ([place])    BEING WATCHED   VVS. 4.2 cm infrarenal abdominal aortic aneurysm without rupture noted 11/08/2016   Arthritis    Cancer ([place])    skin cancer removed from left shoulder  2017   CAP (community acquired pneumonia) 09/02/13-11/05/13    with SIRS   Depression    ?? from prednisone    not on pred now   Diabetes mellitus    type ii    long time ago.   Family history of anesthesia complication    PATIENTS MOM HAD TROUBLE WAKING UP    GERD (gastroesophageal reflux disease)    will take occasional tums   Heart murmur    still has.   Sees Dr. 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   UTI (lower urinary tract infection) 08/26/13   Enterococcus and Escherichia coli both sensitive to nitrofurantoin     Social History   Tobacco Use   Smoking status: Light Smoker    Packs/day: 1.00    Years: 50.00    Total pack years: 50.00    Types: E-cigarettes, Cigarettes   Smokeless tobacco: Never   Tobacco comments:    e-cigs  Vaping Use   Vaping Use: Never used  Substance Use Topics   Alcohol use: No   Drug use: No    Past Surgical History:  Procedure Laterality Date   ABDOMINAL HYSTERECTOMY     BACK SURGERY     CATARACT EXTRACTION     OS   CYST EXCISION Right 05-23-13   Right index finger: cyst   DILATION AND CURETTAGE OF UTERUS     EYE SURGERY     g1 p1     SPINE SURGERY  Sept. 2013   VIDEO ASSISTED THORACOSCOPY (VATS)/ LOBECTOMY Right 02/16/2017   Procedure: Right VIDEO ASSISTED THORACOSCOPY (VATS)/ Right Middle  LOBECTOMY;  Surgeon: [name], MD;  Location: [facility];  Service: Thoracic;  Laterality: Right;    Family History  Problem Relation Age of Onset   Heart failure Mother    Heart disease Mother    Hypertension Mother    Other Mother        varicose veins   Deep vein thrombosis Mother    Varicose Veins Mother    Heart attack Mother    Hypertension Sister        X 2   Diabetes Sister    Hyperlipidemia Sister    Other Sister        varicose veins   Heart disease Father    Diabetes Father    Hyperlipidemia Father    Hypertension Father    Heart attack Father    Heart disease Brother        MI in late 30s   Hyperlipidemia Brother    Heart attack Brother    Coronary artery disease Other    Diabetes Other    Parkinsonism Brother    Heart failure Brother    Hypertension Daughter     Allergies  Allergen Reactions   Rocephin [Ceftriaxone Sodium In Dextrose] Dermatitis and Rash    10/24 /14 blisters of palms & diffuse rash Because of a  history of documented adverse serious drug reaction;Medi Alert bracelet  is recommended   Amoxicillin Rash    Has patient had a PCN reaction causing IMMEDIATE RASH, FACIAL/TONGUE/THROAT SWELLING, SOB, OR LIGHTHEADEDNESS WITH HYPOTENSION:  #  #  #  YES  #  #  #  Has patient had a PCN reaction causing severe rash involving mucus membranes or skin necrosis: No Has patient had a PCN reaction that required hospitalization No Has patient had a PCN reaction occurring within the last 10 years: No If all of the above answers are "NO", then may proceed with Cephalosporin use.    Captopril Cough   Simvastatin Other (See Comments)    MYALGIAS BUTTOCKS CRAMP   Ciprofloxacin Other (See Comments)    Sores in mouth    Semaglutide Nausea And Vomiting   Adhesive [Tape] Rash   Latex Rash    Patient states she gets red and a rash when latex touches her.    Current Medications:   Current Outpatient Medications:    amLODipine (NORVASC) 5 MG tablet, Take 1 tablet (5 mg total) by mouth 2 (two) times daily. Schedule an appointment for further refills, Disp: 120 tablet, Rfl: 2   ascorbic Acid (VITAMIN C) 500 MG CPCR, 1 cap PO QD, Disp: , Rfl:    aspirin EC 81 MG tablet, Take 81 mg by mouth daily. Swallow whole., Disp: , Rfl:    azithromycin (ZITHROMAX) 250 MG tablet, Take 2 tablets on day 1, then 1 tablet daily on days 2 through 5, Disp: 6 tablet, Rfl: 0   B Complex-C (SUPER B COMPLEX PO), Take 1 tablet by mouth daily. , Disp: , Rfl:    Blood Glucose Monitoring Suppl (ONE TOUCH ULTRA 2) w/Device KIT, Use as advised, Disp: 1 each, Rfl: 0   calcium carbonate (TUMS - DOSED IN MG ELEMENTAL CALCIUM) 500 MG chewable tablet, Chew 1 tablet by mouth daily as needed for indigestion or heartburn., Disp: , Rfl:    Cholecalciferol (VITAMIN D3) 125 MCG (5000 UT) TABS, Take 5,000 Units by mouth daily., Disp: , Rfl:    diazepam (VALIUM) 5 MG tablet, TAKE ONE TABLET BY MOUTH AT BEDTIME AS NEEDED.,  Disp: 30 tablet, Rfl: 2    fexofenadine (ALLEGRA) 180 MG tablet, Take 180 mg by mouth at bedtime., Disp: , Rfl:    FLUoxetine (PROZAC) 10 MG capsule, TAKE ONE CAPSULE BY MOUTH DAILY, Disp: 90 capsule, Rfl: 2   furosemide (LASIX) 40 MG tablet, TAKE ONE-HALF TO ONE TABLET BY MOUTH DAILY AS NEEDED FOR IN FLUID., Disp: 90 tablet, Rfl: 0   glipiZIDE (GLUCOTROL) 5 MG tablet, TAKE ONE TABLET BY MOUTH EVERY MORNING AND TWO TABLETS IN THE EVENING, Disp: 270 tablet, Rfl: 0   KERENDIA 10 MG TABS, Take 1 tablet by mouth daily., Disp: , Rfl:    labetalol (NORMODYNE) 300 MG tablet, Take 1 tablet (300 mg total) by mouth 2 (two) times daily., Disp: , Rfl:    Liniments (SALONPAS PAIN RELIEF PATCH EX), Apply 1 patch topically daily as needed (back pain)., Disp: , Rfl:    losartan (COZAAR) 100 MG tablet, TAKE ONE TABLET BY MOUTH DAILY, Disp: 90 tablet, Rfl: 0   meloxicam (MOBIC) 7.5 MG tablet, 1 tablet Orally Once a day, Disp: , Rfl:    ONETOUCH ULTRA test strip, CHECK BLOOD SUGAR TWICE DAILY., Disp: 100 strip, Rfl: 0   OVER THE COUNTER MEDICATION, CBD Gummies 2 each daily, Disp: , Rfl:    oxyCODONE-acetaminophen (PERCOCET/ROXICET) 5-325 MG tablet, Take 1 tablet by mouth 3 (three) times daily as needed., Disp: , Rfl:    PREVIDENT 5000 DRY MOUTH 1.1 % GEL dental gel, Place onto teeth., Disp: , Rfl:    rosuvastatin (CRESTOR) 20 MG tablet, TAKE ONE TABLET BY MOUTH DAILY, Disp: 90 tablet, Rfl: 0   Triamcinolone Acetonide (NASACORT ALLERGY 24HR NA), Place 1 spray into the nose 2 (two) times daily as needed (congestion)., Disp: , Rfl:    TRULICITY 1.5 EP/3.2RJ SOPN, Inject 1.5 mg into the skin once a week., Disp: 2 mL, Rfl: 3   vitamin B-12 (CYANOCOBALAMIN) 1000 MCG tablet, Take 1,000 mcg by mouth daily., Disp: , Rfl:    Vitamin E 180 MG CAPS, Take 180 mg by mouth daily., Disp: , Rfl:    Review of Systems:   Review of Systems  HENT:  Positive for ear pain and sore throat.   Negative unless otherwise specified per HPI.  Vitals:   Vitals:    08/25/22 1548  BP: 120/66  Pulse: 95  Temp: (!) 97.5 F (36.4 C)  TempSrc: Temporal  SpO2: 95%  Weight: 155 lb 4 oz (70.4 kg)  Height: _0  (1.626 m)     Body mass index is 26.65 kg/m.  Physical Exam:   Physical Exam Constitutional:      General: She is not in acute distress.    Appearance: Normal appearance. She is not ill-appearing.  HENT:     Head: Normocephalic and atraumatic.     Right Ear: External ear normal.     Left Ear: External ear normal.     Mouth/Throat:     Pharynx: Posterior oropharyngeal erythema present.  Eyes:     Extraocular Movements: Extraocular movements intact.     Pupils: Pupils are equal, round, and reactive to light.  Cardiovascular:     Rate and Rhythm: Normal rate and regular rhythm.     Heart sounds: Normal heart sounds. No murmur heard.    No gallop.  Pulmonary:     Effort: Pulmonary effort is normal. No respiratory distress.     Breath sounds: Normal breath sounds. No wheezing or rales.  Skin:    General: Skin is warm  and dry.  Neurological:     Mental Status: She is alert and oriented to person, place, and time.  Psychiatric:        Judgment: Judgment normal.    Results for orders placed or performed in visit on 08/25/22  POCT rapid strep A  Result Value Ref Range   Rapid Strep A Screen Positive (A) Negative     Assessment and Plan:   Sore throat - Plan: POCT rapid strep A; Strep test is positive We will treat with azithromycin per orders given her penicillin allergy Tylenol as needed for symptoms Recommend that she follow-up with her PCP if of any of her vertigo returns Also recommend that she follow-up with ENT regarding her ear concerns  I, Luna Glasgow, acting as a Education administrator for [employer], PA.,have documented all relevant documentation on the behalf of Inda Coke, PA,as directed by  Inda Coke, PA while in the presence of Inda Coke, [place].  I, Inda Coke, [place], have reviewed all documentation for this  visit. The documentation on 08/25/22 for the exam, diagnosis, procedures, and orders are all accurate and complete.   Inda Coke, PA-C

## 2022-09-05 ENCOUNTER — Other Ambulatory Visit: Payer: Self-pay | Admitting: Internal Medicine

## 2022-09-12 ENCOUNTER — Other Ambulatory Visit: Payer: Self-pay | Admitting: Internal Medicine

## 2022-09-15 ENCOUNTER — Encounter: Payer: Self-pay | Admitting: Internal Medicine

## 2022-09-15 NOTE — Patient Instructions (Addendum)
Ask Dr Posey Pronto if you can take Movantik for the constipation.     Flu immunization administered today.     Blood work was ordered.   The lab is on the first floor.    Medications changes include :   none    Return in about 6 months (around 03/17/2023) for follow up.    Health Maintenance, Female Adopting a healthy lifestyle and getting preventive care are important in promoting health and wellness. Ask your health care provider about: The right schedule for you to have regular tests and exams. Things you can do on your own to prevent diseases and keep yourself healthy. What should I know about diet, weight, and exercise? Eat a healthy diet  Eat a diet that includes plenty of vegetables, fruits, low-fat dairy products, and lean protein. Do not eat a lot of foods that are high in solid fats, added sugars, or sodium. Maintain a healthy weight Body mass index (BMI) is used to identify weight problems. It estimates body fat based on height and weight. Your health care provider can help determine your BMI and help you achieve or maintain a healthy weight. Get regular exercise Get regular exercise. This is one of the most important things you can do for your health. Most adults should: Exercise for at least 150 minutes each week. The exercise should increase your heart rate and make you sweat (moderate-intensity exercise). Do strengthening exercises at least twice a week. This is in addition to the moderate-intensity exercise. Spend less time sitting. Even light physical activity can be beneficial. Watch cholesterol and blood lipids Have your blood tested for lipids and cholesterol at 79 years of age, then have this test every 5 years. Have your cholesterol levels checked more often if: Your lipid or cholesterol levels are high. You are older than 79 years of age. You are at high risk for heart disease. What should I know about cancer screening? Depending on your health history  and family history, you may need to have cancer screening at various ages. This may include screening for: Breast cancer. Cervical cancer. Colorectal cancer. Skin cancer. Lung cancer. What should I know about heart disease, diabetes, and high blood pressure? Blood pressure and heart disease High blood pressure causes heart disease and increases the risk of stroke. This is more likely to develop in people who have high blood pressure readings or are overweight. Have your blood pressure checked: Every 3-5 years if you are 36-60 years of age. Every year if you are 57 years old or older. Diabetes Have regular diabetes screenings. This checks your fasting blood sugar level. Have the screening done: Once every three years after age 55 if you are at a normal weight and have a low risk for diabetes. More often and at a younger age if you are overweight or have a high risk for diabetes. What should I know about preventing infection? Hepatitis B If you have a higher risk for hepatitis B, you should be screened for this virus. Talk with your health care provider to find out if you are at risk for hepatitis B infection. Hepatitis C Testing is recommended for: Everyone born from [birth year] through 1963. Anyone with known risk factors for hepatitis C. Sexually transmitted infections (STIs) Get screened for STIs, including gonorrhea and chlamydia, if: You are sexually active and are younger than 79 years of age. You are older than 79 years of age and your health care provider tells you that you are  at risk for this type of infection. Your sexual activity has changed since you were last screened, and you are at increased risk for chlamydia or gonorrhea. Ask your health care provider if you are at risk. Ask your health care provider about whether you are at high risk for HIV. Your health care provider may recommend a prescription medicine to help prevent HIV infection. If you choose to take medicine to prevent  HIV, you should first get tested for HIV. You should then be tested every 3 months for as long as you are taking the medicine. Pregnancy If you are about to stop having your period (premenopausal) and you may become pregnant, seek counseling before you get pregnant. Take 400 to 800 micrograms (mcg) of folic acid every day if you become pregnant. Ask for birth control (contraception) if you want to prevent pregnancy. Osteoporosis and menopause Osteoporosis is a disease in which the bones lose minerals and strength with aging. This can result in bone fractures. If you are 60 years old or older, or if you are at risk for osteoporosis and fractures, ask your health care provider if you should: Be screened for bone loss. Take a calcium or vitamin D supplement to lower your risk of fractures. Be given hormone replacement therapy (HRT) to treat symptoms of menopause. Follow these instructions at home: Alcohol use Do not drink alcohol if: Your health care provider tells you not to drink. You are pregnant, may be pregnant, or are planning to become pregnant. If you drink alcohol: Limit how much you have to: 0-1 drink a day. Know how much alcohol is in your drink. In the [place] one drink equals one 12 oz bottle of beer (355 mL), one 5 oz glass of wine (148 mL), or one 1 oz glass of hard liquor (44 mL). Lifestyle Do not use any products that contain nicotine or tobacco. These products include cigarettes, chewing tobacco, and vaping devices, such as e-cigarettes. If you need help quitting, ask your health care provider. Do not use street drugs. Do not share needles. Ask your health care provider for help if you need support or information about quitting drugs. General instructions Schedule regular health, dental, and eye exams. Stay current with your vaccines. Tell your health care provider if: You often feel depressed. You have ever been abused or do not feel safe at home. Summary Adopting a  healthy lifestyle and getting preventive care are important in promoting health and wellness. Follow your health care provider's instructions about healthy diet, exercising, and getting tested or screened for diseases. Follow your health care provider's instructions on monitoring your cholesterol and blood pressure. This information is not intended to replace advice given to you by your health care provider. Make sure you discuss any questions you have with your health care provider. Document Revised: 03/18/2021 Document Reviewed: 03/18/2021 Elsevier Patient Education  [place].

## 2022-09-15 NOTE — Progress Notes (Unsigned)
Subjective:    Patient ID: Sheila Melton, female    DOB: 02/03/1941, 81 y.o.   MRN: [id number]      HPI Sheila Melton is here for a Physical exam.    Sweats on right side of head only.  Dealing with constipation secondary to pain medication.   Medications and allergies reviewed with patient and updated if appropriate.  Current Outpatient Medications on File Prior to Visit  Medication Sig Dispense Refill   amLODipine (NORVASC) 5 MG tablet Take 1 tablet (5 mg total) by mouth 2 (two) times daily. Schedule an appointment for further refills 120 tablet 2   ascorbic Acid (VITAMIN C) 500 MG CPCR 1 cap PO QD     aspirin EC 81 MG tablet Take 81 mg by mouth daily. Swallow whole.     B Complex-C (SUPER B COMPLEX PO) Take 1 tablet by mouth daily.      Blood Glucose Monitoring Suppl (ONE TOUCH ULTRA 2) w/Device KIT Use as advised 1 each 0   calcium carbonate (TUMS - DOSED IN MG ELEMENTAL CALCIUM) 500 MG chewable tablet Chew 1 tablet by mouth daily as needed for indigestion or heartburn.     Cholecalciferol (VITAMIN D3) 125 MCG (5000 UT) TABS Take 5,000 Units by mouth daily.     diazepam (VALIUM) 5 MG tablet TAKE ONE TABLET BY MOUTH AT BEDTIME AS NEEDED. 30 tablet 2   fexofenadine (ALLEGRA) 180 MG tablet Take 180 mg by mouth at bedtime.     FLUoxetine (PROZAC) 10 MG capsule TAKE ONE CAPSULE BY MOUTH DAILY 90 capsule 2   furosemide (LASIX) 40 MG tablet TAKE ONE-HALF TO ONE TABLET BY MOUTH DAILY AS NEEDED FOR IN FLUID. 90 tablet 0   glipiZIDE (GLUCOTROL) 5 MG tablet TAKE ONE TABLET BY MOUTH EVERY MORNING AND TWO TABLETS IN THE EVENING 270 tablet 0   labetalol (NORMODYNE) 300 MG tablet Take 1 tablet (300 mg total) by mouth 2 (two) times daily.     Liniments (SALONPAS PAIN RELIEF PATCH EX) Apply 1 patch topically daily as needed (back pain).     losartan (COZAAR) 100 MG tablet TAKE ONE TABLET BY MOUTH DAILY 90 tablet 0   meloxicam (MOBIC) 7.5 MG tablet 1 tablet Orally Once a day     ONETOUCH ULTRA test  strip CHECK BLOOD SUGAR TWICE DAILY. 100 strip 0   OVER THE COUNTER MEDICATION CBD Gummies 2 each daily     oxyCODONE-acetaminophen (PERCOCET/ROXICET) 5-325 MG tablet Take 1 tablet by mouth 3 (three) times daily as needed.     PREVIDENT 5000 DRY MOUTH 1.1 % GEL dental gel Place onto teeth.     rosuvastatin (CRESTOR) 20 MG tablet TAKE ONE TABLET BY MOUTH DAILY 90 tablet 0   Triamcinolone Acetonide (NASACORT ALLERGY 24HR NA) Place 1 spray into the nose 2 (two) times daily as needed (congestion).     TRULICITY 1.5 QJ/1.9ER SOPN Inject 1.5 mg into the skin once a week. 2 mL 3   vitamin B-12 (CYANOCOBALAMIN) 1000 MCG tablet Take 1,000 mcg by mouth daily.     Vitamin E 180 MG CAPS Take 180 mg by mouth daily.     KERENDIA 10 MG TABS Take 1 tablet by mouth daily.     No current facility-administered medications on file prior to visit.    Review of Systems  Constitutional:  Positive for diaphoresis (right side of head only). Negative for fever.  Eyes:  Negative for visual disturbance.  Respiratory:  Negative for cough,  shortness of breath and wheezing.   Cardiovascular:  Negative for chest pain, palpitations and leg swelling.  Gastrointestinal:  Positive for abdominal pain (chronic- ? from AAA, renal stone) and constipation. Negative for blood in stool, diarrhea and nausea.       Occ gerd  Genitourinary:  Negative for dysuria.  Musculoskeletal:  Positive for arthralgias and back pain.  Skin:  Negative for rash.  Neurological:  Positive for light-headedness (at times). Negative for headaches.  Hematological:  Bruises/bleeds easily.  Psychiatric/Behavioral:  Positive for dysphoric mood. The patient is nervous/anxious.        Objective:   Vitals:   09/16/22 1502  BP: 110/64  Pulse: 90  Temp: 98.4 F (36.9 C)  SpO2: 98%   Filed Weights   09/16/22 1502  Weight: 153 lb (69.4 kg)   Body mass index is 26.26 kg/m.  BP Readings from Last 3 Encounters:  09/16/22 110/64  08/25/22 120/66   08/11/22 128/60    Wt Readings from Last 3 Encounters:  09/16/22 153 lb (69.4 kg)  08/25/22 155 lb 4 oz (70.4 kg)  08/11/22 154 lb (69.9 kg)       Physical Exam Constitutional: She appears well-developed and well-nourished. No distress.  HENT:  Head: Normocephalic and atraumatic.  Right Ear: External ear normal. Normal ear canal and TM Left Ear: External ear normal.  Normal ear canal and TM Mouth/Throat: Oropharynx is clear and moist.  Eyes: Conjunctivae normal.  Neck: Neck supple. No tracheal deviation present. No thyromegaly present.  No carotid bruit  Cardiovascular: Normal rate, regular rhythm and normal heart sounds.   No murmur heard.  No edema. Pulmonary/Chest: Effort normal and breath sounds normal. No respiratory distress. She has no wheezes. She has no rales.  Breast: deferred   Abdominal: Soft. She exhibits no distension. There is no tenderness.  Lymphadenopathy: She has no cervical adenopathy.  Skin: Skin is warm and dry. She is not diaphoretic.  Psychiatric: She has a normal mood and affect. Her behavior is normal.     Lab Results  Component Value Date   WBC 11.3 (H) 08/21/2021   HGB 10.7 (L) 08/21/2021   HCT 32.5 (L) 08/21/2021   PLT 328.0 08/21/2021   GLUCOSE 177 (H) 08/21/2021   CHOL 153 08/21/2021   TRIG 342.0 (H) 08/21/2021   HDL 40.40 08/21/2021   LDLDIRECT 59.0 08/21/2021   LDLCALC 70 05/21/2020   ALT 13 08/21/2021   AST 18 08/21/2021   NA 139 08/21/2021   K 3.9 08/21/2021   CL 105 08/21/2021   CREATININE 2.59 (H) 08/21/2021   BUN 38 (H) 08/21/2021   CO2 25 08/21/2021   TSH 3.01 11/21/2020   INR 0.95 02/12/2017   HGBA1C 6.4 (A) 09/16/2022   MICROALBUR 194.0 Verified by manual dilution. (H) 11/16/2014    Reviewed recent blood work from [facility]     Assessment & Plan:   Physical exam: Screening blood work deferred-reviewed recent blood work from [facility].  Will check A1c Exercise  none - severe  pain in back Weight  ok for age Substance abuse  e-cigarettes  - no desire to quit   Reviewed recommended immunizations. Flu immunization administered today.     Health Maintenance  Topic Date Due   Hepatitis C Screening  Never done   Lung Cancer Screening  Never done   OPHTHALMOLOGY EXAM  10/29/2021   FOOT EXAM  05/21/2022   Diabetic kidney evaluation - GFR measurement  08/21/2022   Diabetic  kidney evaluation - Urine ACR  08/26/2022   COVID-19 Vaccine (5 - Pfizer risk series) 10/02/2022 (Originally 01/11/2021)   TETANUS/TDAP  08/26/2023 (Originally [birth year]29/1959)   Zoster Vaccines- Shingrix (1 of 2) 08/26/2023 (Originally [birth year]/29/1959)   DEXA SCAN  12/06/2022   HEMOGLOBIN A1C  03/17/2023   Medicare Annual Wellness (AWV)  03/20/2023   Pneumonia Vaccine 61+ Years old  Completed   INFLUENZA VACCINE  Completed   HPV VACCINES  Aged Out          See Problem List for Assessment and Plan of chronic medical problems.

## 2022-09-16 ENCOUNTER — Encounter: Payer: Self-pay | Admitting: Internal Medicine

## 2022-09-16 ENCOUNTER — Ambulatory Visit (INDEPENDENT_AMBULATORY_CARE_PROVIDER_SITE_OTHER): Payer: PPO | Admitting: Internal Medicine

## 2022-09-16 VITALS — BP 110/64 | HR 90 | Temp 98.4°F | Ht 64.0 in | Wt 153.0 lb

## 2022-09-16 DIAGNOSIS — E782 Mixed hyperlipidemia: Secondary | ICD-10-CM | POA: Diagnosis not present

## 2022-09-16 DIAGNOSIS — E559 Vitamin D deficiency, unspecified: Secondary | ICD-10-CM

## 2022-09-16 DIAGNOSIS — N184 Chronic kidney disease, stage 4 (severe): Secondary | ICD-10-CM

## 2022-09-16 DIAGNOSIS — M810 Age-related osteoporosis without current pathological fracture: Secondary | ICD-10-CM | POA: Diagnosis not present

## 2022-09-16 DIAGNOSIS — F411 Generalized anxiety disorder: Secondary | ICD-10-CM

## 2022-09-16 DIAGNOSIS — F3289 Other specified depressive episodes: Secondary | ICD-10-CM

## 2022-09-16 DIAGNOSIS — D692 Other nonthrombocytopenic purpura: Secondary | ICD-10-CM

## 2022-09-16 DIAGNOSIS — E1159 Type 2 diabetes mellitus with other circulatory complications: Secondary | ICD-10-CM

## 2022-09-16 DIAGNOSIS — I10 Essential (primary) hypertension: Secondary | ICD-10-CM | POA: Diagnosis not present

## 2022-09-16 DIAGNOSIS — Z0000 Encounter for general adult medical examination without abnormal findings: Secondary | ICD-10-CM | POA: Diagnosis not present

## 2022-09-16 DIAGNOSIS — Z23 Encounter for immunization: Secondary | ICD-10-CM

## 2022-09-16 DIAGNOSIS — Z1159 Encounter for screening for other viral diseases: Secondary | ICD-10-CM

## 2022-09-16 LAB — POCT GLYCOSYLATED HEMOGLOBIN (HGB A1C): Hemoglobin A1C: 6.4 % — AB (ref 4.0–5.6)

## 2022-09-16 NOTE — Assessment & Plan Note (Signed)
Has painless bruising on her forearms Reassured her this was related to thin skin and medications likely contributing

## 2022-09-16 NOTE — Assessment & Plan Note (Signed)
Chronic Controlled, Stable Continue fluoxetine 10 mg daily 

## 2022-09-16 NOTE — Assessment & Plan Note (Addendum)
Chronic Blood pressure well controlled Continue amlodipine 5 mg twice daily, labetalol 300 mg twice daily, losartan 100 mg daily

## 2022-09-16 NOTE — Assessment & Plan Note (Signed)
Chronic A1c today is 4.3%-KGOV-PCHEKBTCYE Continue Trulicity 1.5 mg weekly

## 2022-09-16 NOTE — Assessment & Plan Note (Signed)
Chronic Continue Crestor 20 mg daily

## 2022-09-16 NOTE — Assessment & Plan Note (Signed)
Chronic Following with Dr. Posey Pronto Reviewed recent blood work done from them-we will hold off on additional blood work today On Kerendia Sugars well controlled Blood pressure well controlled

## 2022-09-16 NOTE — Assessment & Plan Note (Signed)
Chronic DEXA up-to-date

## 2022-09-19 ENCOUNTER — Other Ambulatory Visit: Payer: Self-pay | Admitting: Internal Medicine

## 2022-09-23 ENCOUNTER — Ambulatory Visit: Payer: Self-pay

## 2022-09-23 NOTE — Patient Outreach (Signed)
  Care Coordination   Follow Up Visit Note   09/23/2022 Name: Sheila Melton MRN: [id number] DOB: 1941/09/11  Sheila Melton is a 81 y.o. year old female who sees Burns, Claudina Lick, MD for primary care. I spoke with  Sheila Melton by phone today.  What matters to the patients health and wellness today?  Mrs. Catino reports she is doing well. She reports annual visit with PCP on 09/16/22. She reports she is doing well.  Reports chronic back problems, but states it is being managed. 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 Primary concern is the cost of Trulicity. She states she pays $376/EGBTD for Trulicity. She would like to know what she can do moving forward into the next year to decrease her expense as she states she reaches the "donut hole" early in the year.  Goals Addressed             This Visit's Progress    Community resources       Care Coordination Interventions: Referral to central pharmacy team-patient would like recommendations what can be done to decrease the cost of Trulicity for her moving forward and into the next year. If there is anything that she can do early to receive assistance. She reports she reaches the "donut hole" early in the year Provided positive feedback on self health management       SDOH assessments and interventions completed:  No  Care Coordination Interventions Activated:  Yes  Care Coordination Interventions:  Yes, provided   Follow up plan: Follow up call scheduled for 10/23/22    Encounter Outcome:  Pt. Visit Completed   Thea Silversmith, RN, MSN, BSN, [place] Coordinator [phone]

## 2022-09-23 NOTE — Patient Instructions (Signed)
Visit Information  Thank you for taking time to visit with me today. Please don't hesitate to contact me if I can be of assistance to you.   Following are the goals we discussed today:   Goals Addressed             This Visit's Progress    Community resources       Care Coordination Interventions: Referral to central pharmacy team-patient would like recommendations what can be done to decrease the cost of Trulicity for her moving forward and into the next year. If there is anything that she can do early to receive assistance. She reports she reaches the "donut hole" early in the year Provided positive feedback on self health management       Our next appointment is by telephone on 10/23/22 at 11:00 am  Please call the care guide team at [phone] if you need to cancel or reschedule your appointment.   If you are experiencing a Mental Health or [place] or need someone to talk to, please call the Suicide and Crisis Lifeline: 988  Patient verbalizes understanding of instructions and care plan provided today and agrees to view in [place]. Active MyChart status and patient understanding of how to access instructions and care plan via MyChart confirmed with patient.     Thea Silversmith, RN, MSN, BSN, [place] Coordinator [phone]

## 2022-09-25 ENCOUNTER — Telehealth: Payer: Self-pay

## 2022-09-25 NOTE — Progress Notes (Signed)
[facility]) Care Management  [place]   09/25/2022  Sheila Melton Mar 04, 1941 [id number]  Reason for referral: Medication Assistance  Referral source: [place] Coordinator Referral medication(s): Trulicity Current insurance:HTA  Medication Review Findings:  [facility] pharmacy Team received a medication assistance referral for Trulicity. Discussed with patient medication assistance for Trulicity is no longer available for new enrollments. She was offered alternative therapy such as Ozempic. Sheila Melton reported that she "passed out and threw up" with Rybelsus. Given patient's report of side effect, this pharmacist was hesitant about Ozempic. Therefore, SGLT2 inhibitor was suggested but declined Jardiance since her husband was on this medication for ~ 3 months and he "almost died". She states her husband had almost every side effect of this drug with residual issues that lasted months after he stopped the medication. Wilder Glade was also discussed as another option, but patient seemed to express reservations.   Sheila Melton stated that she will try to get a refill for Trulicity since Dr. Quay Burow office does not have samples. She currently has one pen remaining. Discussed with Sheila Melton that I will allow time for her to digest options and do research on medication. She asked that I follow up with her the Monday after Thanksgiving.  Will follow up with patient on 10/06/2022.   Thank you for allowing pharmacy to be a part of this patient's care.  Kristeen Miss, PharmD Clinical Pharmacist [place] Cell: [phone]

## 2022-10-06 ENCOUNTER — Telehealth: Payer: Self-pay

## 2022-10-06 NOTE — Progress Notes (Signed)
[facility]) [place]   10/06/2022  Sheila Melton 02/13/1941 [id number]   Reason for referral: Medication Assistance with Trulicity  Referral source:  [place] Coordinator Current insurance: [employer]  Reason for call: Medication assistance follow-up with patient   Outreach:  Unsuccessful telephone call attempt #1 to patient.   HIPAA compliant voicemail left requesting a return call  Plan:  -I will make another outreach attempt to patient within 3-4 business days.   Thank you for allowing pharmacy to be a part of this patient's care.  Kristeen Miss, PharmD Clinical Pharmacist [place] Cell: [phone]

## 2022-10-10 ENCOUNTER — Other Ambulatory Visit: Payer: Self-pay | Admitting: Internal Medicine

## 2022-10-13 ENCOUNTER — Telehealth: Payer: Self-pay

## 2022-10-13 NOTE — Progress Notes (Signed)
[facility])  [place] Team  10/13/2022  MAKARIA POARCH [birth year] [id number]  Reason for referral: Medication Assistance with [facility] pharmacy referral is being closed due to the following reasons:  -Trulicity is no longer accepting new applicants for their patient assistance program. Will close [place] case as patient declined/deferred therapeutically equivalent or alternative medications for Trulicity. She feels that since her medicare prescription coverage will be affordable again in 2024, she will hold off on switching therapy as she is tolerating medication well and re-consider medication assistance when she is in the Medicare Coverage Gap in 2024.   -Additionally, patient does not qualify for LIS Extra help. Enrolled patient in [employer]'s Type 2 Diabetes wait list and Ms. Jaskowiak plans to enroll in [employer]'s diabetic wait list - declined assistance from this pharmacist since social security information had to be used.   -I have provided my contact information if patient or family needs to reach out to me in the future.   [place] is happy to assist the patient/family in the future for clinical pharmacy needs, following a discussion from your team about [place] outreach.   Thank you for allowing [facility] pharmacy to be a part of this patient's care.   Kristeen Miss, PharmD Clinical Pharmacist [place] Cell: [phone]

## 2022-10-22 ENCOUNTER — Other Ambulatory Visit: Payer: Self-pay | Admitting: Internal Medicine

## 2022-10-23 ENCOUNTER — Ambulatory Visit: Payer: Self-pay

## 2022-10-23 NOTE — Patient Outreach (Signed)
  Care Coordination   Follow Up Visit Note   10/23/2022 Name: Sheila Melton MRN: [id number] DOB: 16-Jan-1941  Sheila Melton is a 81 y.o. year old female who sees Burns, Claudina Lick, MD for primary care. I spoke with  Sheila Melton by phone today.  What matters to the patients health and wellness today?  Sheila Melton reports she is doing well. She states she has communicated with pharmacist regarding medication assistance for Trulicity-no assistance available at this time. But states she does not want to change to a different medication because Trulicity is working so well for her. She reports her blood sugar this morning was 118. She denies any concerns or questions at this time. No care coordination needs at this time. Sheila. Melton is agreeable to case closure and will contact care coordinator if needs change in the future.  Goals Addressed             This Visit's Progress    COMPLETED: Community resources       Care Coordination Interventions: Encouraged to contact care coordinator if needs change. Confirmed patient has RNCM's contact number. Positive feedback on self health management provided        SDOH assessments and interventions completed:  No  Care Coordination Interventions:  Yes, provided   Follow up plan: No further intervention required.   Encounter Outcome:  Pt. Visit Completed   Sheila Silversmith, RN, MSN, BSN, [place] Coordinator [phone]

## 2022-10-23 NOTE — Patient Instructions (Signed)
Visit Information  Thank you for taking time to visit with me today. Please don't hesitate to contact me if I can be of assistance to you.   Following are the goals we discussed today:   Goals Addressed             This Visit's Progress    COMPLETED: Community resources       Care Coordination Interventions: Encouraged to contact care coordinator if needs change. Confirmed patient has RNCM's contact number. Positive feedback on self health management provided        If you are experiencing a Mental Health or [place] or need someone to talk to, please call the Suicide and Crisis Lifeline: [place], RN, MSN, BSN, [place] Coordinator [phone]

## 2022-11-20 DIAGNOSIS — M542 Cervicalgia: Secondary | ICD-10-CM | POA: Diagnosis not present

## 2022-11-20 DIAGNOSIS — G894 Chronic pain syndrome: Secondary | ICD-10-CM | POA: Diagnosis not present

## 2022-11-20 DIAGNOSIS — F1120 Opioid dependence, uncomplicated: Secondary | ICD-10-CM | POA: Diagnosis not present

## 2022-11-20 DIAGNOSIS — M961 Postlaminectomy syndrome, not elsewhere classified: Secondary | ICD-10-CM | POA: Diagnosis not present

## 2022-11-20 DIAGNOSIS — M5416 Radiculopathy, lumbar region: Secondary | ICD-10-CM | POA: Diagnosis not present

## 2022-12-13 ENCOUNTER — Other Ambulatory Visit: Payer: Self-pay | Admitting: Internal Medicine

## 2022-12-19 DIAGNOSIS — H40013 Open angle with borderline findings, low risk, bilateral: Secondary | ICD-10-CM | POA: Diagnosis not present

## 2022-12-23 ENCOUNTER — Ambulatory Visit: Payer: PPO | Attending: Cardiovascular Disease | Admitting: Cardiovascular Disease

## 2022-12-23 ENCOUNTER — Encounter: Payer: Self-pay | Admitting: Cardiovascular Disease

## 2022-12-23 VITALS — BP 88/53 | HR 83 | Ht 64.0 in | Wt 147.8 lb

## 2022-12-23 DIAGNOSIS — E7849 Other hyperlipidemia: Secondary | ICD-10-CM

## 2022-12-23 DIAGNOSIS — I10 Essential (primary) hypertension: Secondary | ICD-10-CM | POA: Diagnosis not present

## 2022-12-23 DIAGNOSIS — I714 Abdominal aortic aneurysm, without rupture, unspecified: Secondary | ICD-10-CM | POA: Diagnosis not present

## 2022-12-23 DIAGNOSIS — I771 Stricture of artery: Secondary | ICD-10-CM

## 2022-12-23 DIAGNOSIS — N184 Chronic kidney disease, stage 4 (severe): Secondary | ICD-10-CM | POA: Diagnosis not present

## 2022-12-23 DIAGNOSIS — Z720 Tobacco use: Secondary | ICD-10-CM

## 2022-12-23 NOTE — Patient Instructions (Signed)
Medication Instructions:  STOP the Amlodipine  *If you need a refill on your cardiac medications before your next appointment, please call your pharmacy*   Lab Work: None ordered If you have labs (blood work) drawn today and your tests are completely normal, you will receive your results only by: [place] (if you have MyChart) OR A paper copy in the mail If you have any lab test that is abnormal or we need to change your treatment, we will call you to review the results.   Testing/Procedures: None ordered   Follow-Up: At [facility], you and your health needs are our priority.  As part of our continuing mission to provide you with exceptional heart care, we have created designated Provider Care Teams.  These Care Teams include your primary Cardiologist (physician) and Advanced Practice Providers (APPs -  Physician Assistants and Nurse Practitioners) who all work together to provide you with the care you need, when you need it.  We recommend signing up for the patient portal called "MyChart".  Sign up information is provided on this After Visit Summary.  MyChart is used to connect with patients for Virtual Visits (Telemedicine).  Patients are able to view lab/test results, encounter notes, upcoming appointments, etc.  Non-urgent messages can be sent to your provider as well.   To learn more about what you can do with MyChart, go to [email or web address].    Your next appointment:   12 month(s)  Provider:   Kathlyn Sacramento, MD

## 2022-12-23 NOTE — Progress Notes (Unsigned)
Cardiology Office Note   Date:  12/24/2022   ID:  Sheila Melton, DOB 1941/05/29, MRN [id number]  PCP:  Binnie Rail, MD  Cardiologist: Dr. Fletcher Anon  No chief complaint on file.    History of Present Illness: Sheila Melton is a 82 y.o. female who is here today for follow-up visit regarding left subclavian artery stenosis.   The patient has known history of abdominal aortic aneurysm followed by vascular surgery, hyperlipidemia, chronic kidney disease , tobacco use and diabetes mellitus.   Echocardiogram July 2009 showed normal left ventricular function and mild mitral regurgitation.  She has known history of asymptomatic left subclavian artery stenosis for many years that was left to be treated medically due to lack of symptoms.   She has known large abdominal aortic aneurysm followed by vascular surgery with difficult management options.  She has been stable from a cardiac standpoint with no chest pain or worsening dyspnea.  Her biggest complaint is chronic low back pain.  She also has symptoms of orthostatic dizziness and she had intermittent hypotension. No left arm claudication. She had an echocardiogram done in December 2022 which showed normal LV systolic function with no significant valvular abnormalities and small pericardial effusion. Carotid Doppler in December 2022 showed mild nonobstructive disease with known left subclavian stenosis.  Past Medical History:  Diagnosis Date   AAA (abdominal aortic aneurysm) ([place])    BEING WATCHED   VVS. 4.2 cm infrarenal abdominal aortic aneurysm without rupture noted 11/08/2016   Arthritis    Cancer ([place])    skin cancer removed from left shoulder  2017   CAP (community acquired pneumonia) 09/02/13-11/05/13    with SIRS   Depression    ?? from prednisone    not on pred now   Diabetes mellitus    type ii    long time ago.   Family history of anesthesia complication    PATIENTS MOM HAD TROUBLE WAKING UP    GERD (gastroesophageal reflux  disease)    will take occasional tums   Heart murmur    still has.   Sees Dr. Stanford Breed   History of kidney stones    has them at the present time   Hyperlipidemia    Hypertension    unspecified essential   Renal insufficiency    "FSGS"   Subclavian steal syndrome    LEFT SIDE   NO SIDE EFFECTS   Subclavian steal syndrome --  left    85% blocked   UTI (lower urinary tract infection) 08/26/13   Enterococcus and Escherichia coli both sensitive to nitrofurantoin    Past Surgical History:  Procedure Laterality Date   ABDOMINAL HYSTERECTOMY     BACK SURGERY     CATARACT EXTRACTION     OS   CYST EXCISION Right 05-23-13   Right index finger: cyst   DILATION AND CURETTAGE OF UTERUS     EYE SURGERY     g1 p1     SPINE SURGERY  Sept. 2013   VIDEO ASSISTED THORACOSCOPY (VATS)/ LOBECTOMY Right 02/16/2017   Procedure: Right VIDEO ASSISTED THORACOSCOPY (VATS)/ Right Middle LOBECTOMY;  Surgeon: [name], MD;  Location: [facility];  Service: Thoracic;  Laterality: Right;     Current Outpatient Medications  Medication Sig Dispense Refill   ascorbic Acid (VITAMIN C) 500 MG CPCR 1 cap PO QD     aspirin EC 81 MG tablet Take 81 mg by mouth daily. Swallow whole.     B  Complex-C (SUPER B COMPLEX PO) Take 1 tablet by mouth daily.      Blood Glucose Monitoring Suppl (ONE TOUCH ULTRA 2) w/Device KIT Use as advised 1 each 0   calcium carbonate (TUMS - DOSED IN MG ELEMENTAL CALCIUM) 500 MG chewable tablet Chew 1 tablet by mouth daily as needed for indigestion or heartburn.     Cholecalciferol (VITAMIN D3) 125 MCG (5000 UT) TABS Take 5,000 Units by mouth daily.     diazepam (VALIUM) 5 MG tablet TAKE ONE TABLET BY MOUTH AT BEDTIME AS NEEDED. 30 tablet 2   fexofenadine (ALLEGRA) 180 MG tablet Take 180 mg by mouth at bedtime.     FLUoxetine (PROZAC) 10 MG capsule TAKE ONE CAPSULE BY MOUTH DAILY 90 capsule 2   furosemide (LASIX) 40 MG tablet TAKE ONE-HALF TO ONE TABLET BY MOUTH DAILY AS NEEDED FOR  IN FLUID. 90 tablet 0   glipiZIDE (GLUCOTROL) 5 MG tablet TAKE ONE TABLET BY MOUTH EVERY MORNING AND TWO TABLETS IN THE EVENING 270 tablet 0   KERENDIA 10 MG TABS Take 1 tablet by mouth daily.     labetalol (NORMODYNE) 300 MG tablet Take 1 tablet (300 mg total) by mouth 2 (two) times daily.     Liniments (SALONPAS PAIN RELIEF PATCH EX) Apply 1 patch topically daily as needed (back pain).     losartan (COZAAR) 100 MG tablet TAKE ONE TABLET BY MOUTH DAILY 90 tablet 0   meloxicam (MOBIC) 7.5 MG tablet 1 tablet Orally Once a day     ONETOUCH ULTRA test strip CHECK BLOOD SUGAR TWICE DAILY 100 strip 0   OVER THE COUNTER MEDICATION CBD Gummies 2 each daily     oxyCODONE-acetaminophen (PERCOCET/ROXICET) 5-325 MG tablet Take 1 tablet by mouth 3 (three) times daily as needed.     PREVIDENT 5000 DRY MOUTH 1.1 % GEL dental gel Place onto teeth.     rosuvastatin (CRESTOR) 20 MG tablet TAKE ONE TABLET BY MOUTH DAILY 90 tablet 0   Triamcinolone Acetonide (NASACORT ALLERGY 24HR NA) Place 1 spray into the nose 2 (two) times daily as needed (congestion).     TRULICITY 1.5 0000000 SOPN Inject 1.5 mg into the skin once a week. 2 mL 3   vitamin B-12 (CYANOCOBALAMIN) 1000 MCG tablet Take 1,000 mcg by mouth daily.     Vitamin E 180 MG CAPS Take 180 mg by mouth daily.     No current facility-administered medications for this visit.    Allergies:   Rocephin [ceftriaxone sodium in dextrose], Amoxicillin, Captopril, Simvastatin, Ciprofloxacin, Semaglutide, Adhesive [tape], and Latex    Social History:  The patient  reports that she has been smoking e-cigarettes and cigarettes. She has a 50.00 pack-year smoking history. She has never used smokeless tobacco. She reports that she does not drink alcohol and does not use drugs.   Family History:  The patient's family history includes Coronary artery disease in an other family member; Deep vein thrombosis in her mother; Diabetes in her father, sister, and another family  member; Heart attack in her brother, father, and mother; Heart disease in her brother, father, and mother; Heart failure in her brother and mother; Hyperlipidemia in her brother, father, and sister; Hypertension in her daughter, father, mother, and sister; Other in her mother and sister; Parkinsonism in her brother; Varicose Veins in her mother.    ROS:  Please see the history of present illness.   Otherwise, review of systems are positive for none.   All other systems  are reviewed and negative.    PHYSICAL EXAM: VS:  BP (!) 88/53   Pulse 83   Ht 5' 4"$  (1.626 m)   Wt 147 lb 12.8 oz (67 kg)   SpO2 98%   BMI 25.37 kg/m  , BMI Body mass index is 25.37 kg/m. GEN: Well nourished, well developed, in no acute distress  HEENT: normal  Neck: no JVD, carotid bruits, or masses. She does have a bruit at the left upper chest area Cardiac: RRR; rubs, or gallops,no edema . There is 2/ 6 systolic ejection murmur in the aortic area Respiratory:  clear to auscultation bilaterally, normal work of breathing GI: soft, nontender, nondistended, + BS MS: no deformity or atrophy  Skin: warm and dry, no rash Neuro:  Strength and sensation are intact Psych: euthymic mood, full affect Vascular: Radial pulses normal on the right side and mildly diminished on the left side.  EKG:  EKG is ordered today. Normal sinus rhythm with no significant ST or T wave changes.   Recent Labs: No results found for requested labs within last 365 days.    Lipid Panel    Component Value Date/Time   CHOL 153 08/21/2021 1413   TRIG 342.0 (H) 08/21/2021 1413   TRIG 263 (HH) 09/15/2006 1116   HDL 40.40 08/21/2021 1413   CHOLHDL 4 08/21/2021 1413   VLDL 68.4 (H) 08/21/2021 1413   LDLCALC 70 05/21/2020 1437   LDLDIRECT 59.0 08/21/2021 1413      Wt Readings from Last 3 Encounters:  12/23/22 147 lb 12.8 oz (67 kg)  09/16/22 153 lb (69.4 kg)  08/25/22 155 lb 4 oz (70.4 kg)        ASSESSMENT AND PLAN:  1.  Left  subclavian artery stenosis: She is overall asymptomatic with no left arm claudication or symptoms of clinical steal syndrome.  Continue medical therapy.  2. Essential hypertension: Her blood pressure has been low even though it was checked in the right arm.  She complains of orthostatic dizziness.  I discontinued amlodipine today.  Continue losartan and labetalol for now.  3. Hyperlipidemia: Most recent lipid profile showed an LDL of 59.  This is at target.  Continue rosuvastatin.  4. Tobacco use: She is using electronic cigarettes and trying to quit smoking. I discussed with her the importance of complete cessation.  5.   large size abdominal aortic aneurysm: Followed by vascular surgery.   Disposition:   Follow-up in 1 year. Signed,  Kathlyn Sacramento, MD  12/24/2022 1:17 PM    [place]

## 2022-12-31 DIAGNOSIS — N2581 Secondary hyperparathyroidism of renal origin: Secondary | ICD-10-CM | POA: Diagnosis not present

## 2022-12-31 DIAGNOSIS — D631 Anemia in chronic kidney disease: Secondary | ICD-10-CM | POA: Diagnosis not present

## 2022-12-31 DIAGNOSIS — E1122 Type 2 diabetes mellitus with diabetic chronic kidney disease: Secondary | ICD-10-CM | POA: Diagnosis not present

## 2022-12-31 DIAGNOSIS — N184 Chronic kidney disease, stage 4 (severe): Secondary | ICD-10-CM | POA: Diagnosis not present

## 2022-12-31 DIAGNOSIS — I129 Hypertensive chronic kidney disease with stage 1 through stage 4 chronic kidney disease, or unspecified chronic kidney disease: Secondary | ICD-10-CM | POA: Diagnosis not present

## 2023-01-01 ENCOUNTER — Other Ambulatory Visit: Payer: Self-pay | Admitting: Internal Medicine

## 2023-01-01 ENCOUNTER — Other Ambulatory Visit: Payer: Self-pay

## 2023-01-12 ENCOUNTER — Other Ambulatory Visit: Payer: Self-pay | Admitting: Internal Medicine

## 2023-01-19 DIAGNOSIS — H40053 Ocular hypertension, bilateral: Secondary | ICD-10-CM | POA: Diagnosis not present

## 2023-01-19 DIAGNOSIS — H40013 Open angle with borderline findings, low risk, bilateral: Secondary | ICD-10-CM | POA: Diagnosis not present

## 2023-02-02 ENCOUNTER — Other Ambulatory Visit (HOSPITAL_BASED_OUTPATIENT_CLINIC_OR_DEPARTMENT_OTHER): Payer: Self-pay

## 2023-02-02 ENCOUNTER — Other Ambulatory Visit: Payer: Self-pay

## 2023-02-02 ENCOUNTER — Other Ambulatory Visit (HOSPITAL_COMMUNITY): Payer: Self-pay

## 2023-02-02 MED FILL — Dulaglutide Soln Auto-injector 1.5 MG/0.5ML: SUBCUTANEOUS | 28 days supply | Qty: 2 | Fill #0 | Status: AC

## 2023-02-03 ENCOUNTER — Other Ambulatory Visit (HOSPITAL_COMMUNITY): Payer: Self-pay

## 2023-02-17 ENCOUNTER — Other Ambulatory Visit: Payer: Self-pay

## 2023-02-17 DIAGNOSIS — I714 Abdominal aortic aneurysm, without rupture, unspecified: Secondary | ICD-10-CM

## 2023-02-17 MED ORDER — PREDNISONE 50 MG PO TABS: 50.0000 mg | ORAL_TABLET | Freq: Three times a day (TID) | ORAL | 0 refills | Status: DC

## 2023-02-18 ENCOUNTER — Telehealth: Payer: Self-pay

## 2023-02-18 DIAGNOSIS — M961 Postlaminectomy syndrome, not elsewhere classified: Secondary | ICD-10-CM | POA: Diagnosis not present

## 2023-02-18 DIAGNOSIS — Z9689 Presence of other specified functional implants: Secondary | ICD-10-CM | POA: Diagnosis not present

## 2023-02-18 DIAGNOSIS — F1120 Opioid dependence, uncomplicated: Secondary | ICD-10-CM | POA: Diagnosis not present

## 2023-02-18 DIAGNOSIS — M542 Cervicalgia: Secondary | ICD-10-CM | POA: Diagnosis not present

## 2023-02-18 NOTE — Telephone Encounter (Signed)
Patient called to advise Sheila Melton that  CTA was ordered for her With contrast.  She has kidney issues and said this should be done without.  Pt was advised that there was confusion when she answered yes on the contrast allergy form when she was in the office.  Per Drs last note, CT is supposed to be without contrast.  Pt aware and verbalized understanding that the CT order will be changed to show No contrast.

## 2023-02-18 NOTE — Addendum Note (Signed)
Addended byWaynetta Pean on: 02/18/2023 11:21 AM   Modules accepted: Orders

## 2023-02-25 MED FILL — Dulaglutide Soln Auto-injector 1.5 MG/0.5ML: SUBCUTANEOUS | 28 days supply | Qty: 2 | Fill #1 | Status: CN

## 2023-02-27 ENCOUNTER — Other Ambulatory Visit: Payer: Self-pay

## 2023-02-27 ENCOUNTER — Telehealth: Payer: Self-pay | Admitting: Internal Medicine

## 2023-02-27 ENCOUNTER — Other Ambulatory Visit (HOSPITAL_COMMUNITY): Payer: Self-pay

## 2023-02-27 MED ORDER — TRULICITY 1.5 MG/0.5ML ~~LOC~~ SOAJ
1.5000 mg | SUBCUTANEOUS | 3 refills | Status: DC
  Filled 2023-03-02: qty 2, 28d supply, fill #0
  Filled 2023-03-26: qty 2, 28d supply, fill #1
  Filled 2023-04-24: qty 2, 28d supply, fill #2
  Filled 2023-05-20: qty 2, 28d supply, fill #3

## 2023-02-27 NOTE — Telephone Encounter (Signed)
Sent in today 

## 2023-02-27 NOTE — Telephone Encounter (Signed)
Prescription Request  02/27/2023  LOV: 09/16/2022  What is the name of the medication or equipment?   Dulaglutide (TRULICITY) 1.5 MG/0.5ML SOPN   Have you contacted your pharmacy to request a refill? Yes   Which pharmacy would you like this sent to?   [facility] [place], [place] - [address] [place] [address] Phone: [phone] Fax: [phone]    Patient notified that their request is being sent to the clinical staff for review and that they should receive a response within 2 business days.   Please advise at [phone]

## 2023-03-02 ENCOUNTER — Other Ambulatory Visit (HOSPITAL_COMMUNITY): Payer: Self-pay

## 2023-03-09 ENCOUNTER — Ambulatory Visit (HOSPITAL_COMMUNITY): Payer: PPO

## 2023-03-09 ENCOUNTER — Ambulatory Visit (HOSPITAL_COMMUNITY)
Admission: RE | Admit: 2023-03-09 | Discharge: 2023-03-09 | Disposition: A | Discharge status: Home / Self Care | Payer: PPO | Source: Ambulatory Visit | Attending: Vascular Surgery | Admitting: Vascular Surgery

## 2023-03-09 DIAGNOSIS — I714 Abdominal aortic aneurysm, without rupture, unspecified: Secondary | ICD-10-CM | POA: Diagnosis not present

## 2023-03-09 DIAGNOSIS — K5730 Diverticulosis of large intestine without perforation or abscess without bleeding: Secondary | ICD-10-CM | POA: Diagnosis not present

## 2023-03-09 DIAGNOSIS — N200 Calculus of kidney: Secondary | ICD-10-CM | POA: Diagnosis not present

## 2023-03-10 ENCOUNTER — Encounter: Payer: Self-pay | Admitting: Vascular Surgery

## 2023-03-10 ENCOUNTER — Ambulatory Visit (INDEPENDENT_AMBULATORY_CARE_PROVIDER_SITE_OTHER): Payer: PPO | Admitting: Vascular Surgery

## 2023-03-10 ENCOUNTER — Telehealth: Payer: Self-pay | Admitting: Internal Medicine

## 2023-03-10 VITALS — BP 127/74 | HR 92 | Temp 97.8°F | Resp 14 | Ht 64.0 in | Wt 156.0 lb

## 2023-03-10 DIAGNOSIS — I7143 Infrarenal abdominal aortic aneurysm, without rupture: Secondary | ICD-10-CM | POA: Diagnosis not present

## 2023-03-10 NOTE — Progress Notes (Signed)
Patient name: Sheila Melton MRN: [id number] DOB: 19-Jun-1941 Sex: female  REASON FOR VISIT: 6 month follow-up for AAA  HPI: Sheila Melton is a 82 y.o. female with hx chronic back pain with spinal stimulator, HTN, HLD, stage IV CKD, DM that presents for 6 month follow-up of her AAA.  Her aneurysm has been followed by Dr. Arbie Cookey.  She had previously been followed by Dr. Arbie Cookey up to 5 cm and he recommended follow-up with me.  Her AAA last measured 5.4 cm approximately 6 months ago.  She presents for follow-up with a non-contrast CT scan today.    Still having chronic back pain that is unchanged..  Past Medical History:  Diagnosis Date   AAA (abdominal aortic aneurysm) ([facility])    BEING WATCHED   VVS. 4.2 cm infrarenal abdominal aortic aneurysm without rupture noted 11/08/2016   Arthritis    Cancer ([facility])    skin cancer removed from left shoulder  2017   CAP (community acquired pneumonia) 09/02/13-11/05/13    with SIRS   Depression    ?? from prednisone    not on pred now   Diabetes mellitus    type ii    long time ago.   Family history of anesthesia complication    PATIENTS MOM HAD TROUBLE WAKING UP    GERD (gastroesophageal reflux disease)    will take occasional tums   Heart murmur    still has.   Sees Dr. Jens Som   History of kidney stones    has them at the present time   Hyperlipidemia    Hypertension    unspecified essential   Renal insufficiency    "FSGS"   Subclavian steal syndrome    LEFT SIDE   NO SIDE EFFECTS   Subclavian steal syndrome --  left    85% blocked   UTI (lower urinary tract infection) 08/26/13   Enterococcus and Escherichia coli both sensitive to nitrofurantoin    Past Surgical History:  Procedure Laterality Date   ABDOMINAL HYSTERECTOMY     BACK SURGERY     CATARACT EXTRACTION     OS   CYST EXCISION Right 05-23-13   Right index finger: cyst   DILATION AND CURETTAGE OF UTERUS     EYE SURGERY     g1 p1     SPINE SURGERY  Sept. 2013   VIDEO ASSISTED  THORACOSCOPY (VATS)/ LOBECTOMY Right 02/16/2017   Procedure: Right VIDEO ASSISTED THORACOSCOPY (VATS)/ Right Middle LOBECTOMY;  Surgeon: Loreli Slot, MD;  Location: [facility];  Service: Thoracic;  Laterality: Right;    Family History  Problem Relation Age of Onset   Heart failure Mother    Heart disease Mother    Hypertension Mother    Other Mother        varicose veins   Deep vein thrombosis Mother    Varicose Veins Mother    Heart attack Mother    Hypertension Sister        X 2   Diabetes Sister    Hyperlipidemia Sister    Other Sister        varicose veins   Heart disease Father    Diabetes Father    Hyperlipidemia Father    Hypertension Father    Heart attack Father    Heart disease Brother        MI in late 27s   Hyperlipidemia Brother    Heart attack Brother    Coronary artery disease Other  Diabetes Other    Parkinsonism Brother    Heart failure Brother    Hypertension Daughter     SOCIAL HISTORY: Social History   Tobacco Use   Smoking status: Light Smoker    Packs/day: 1.00    Years: 50.00    Additional pack years: 0.00    Total pack years: 50.00    Types: E-cigarettes, Cigarettes   Smokeless tobacco: Never   Tobacco comments:    e-cigs  Substance Use Topics   Alcohol use: No    Allergies  Allergen Reactions   Rocephin [Ceftriaxone Sodium In Dextrose] Dermatitis and Rash    10/24 /14 blisters of palms & diffuse rash Because of a history of documented adverse serious drug reaction;Medi Alert bracelet  is recommended   Amoxicillin Rash    Has patient had a PCN reaction causing IMMEDIATE RASH, FACIAL/TONGUE/THROAT SWELLING, SOB, OR LIGHTHEADEDNESS WITH HYPOTENSION:  #  #  #  YES  #  #  #  Has patient had a PCN reaction causing severe rash involving mucus membranes or skin necrosis: No Has patient had a PCN reaction that required hospitalization No Has patient had a PCN reaction occurring within the last 10 years: No If all of the above answers  are "NO", then may proceed with Cephalosporin use.    Captopril Cough   Simvastatin Other (See Comments)    MYALGIAS BUTTOCKS CRAMP   Ciprofloxacin Other (See Comments)    Sores in mouth    Semaglutide Nausea And Vomiting   Adhesive [Tape] Rash   Latex Rash    Patient states she gets red and a rash when latex touches her.    Current Outpatient Medications  Medication Sig Dispense Refill   ascorbic Acid (VITAMIN C) 500 MG CPCR 1 cap PO QD     aspirin EC 81 MG tablet Take 81 mg by mouth daily. Swallow whole.     B Complex-C (SUPER B COMPLEX PO) Take 1 tablet by mouth daily.      Blood Glucose Monitoring Suppl (ONE TOUCH ULTRA 2) w/Device KIT Use as advised 1 each 0   calcium carbonate (TUMS - DOSED IN MG ELEMENTAL CALCIUM) 500 MG chewable tablet Chew 1 tablet by mouth daily as needed for indigestion or heartburn.     Cholecalciferol (VITAMIN D3) 125 MCG (5000 UT) TABS Take 5,000 Units by mouth daily.     diazepam (VALIUM) 5 MG tablet TAKE ONE TABLET BY MOUTH AT BEDTIME AS NEEDED. 30 tablet 5   Dulaglutide (TRULICITY) 1.5 MG/0.5ML SOPN Inject 1.5 mg into the skin once a week. 2 mL 3   fexofenadine (ALLEGRA) 180 MG tablet Take 180 mg by mouth at bedtime.     FLUoxetine (PROZAC) 10 MG capsule TAKE ONE CAPSULE BY MOUTH DAILY 90 capsule 2   furosemide (LASIX) 40 MG tablet TAKE ONE-HALF TO ONE TABLET BY MOUTH DAILY AS NEEDED FOR IN FLUID. 90 tablet 0   glipiZIDE (GLUCOTROL) 5 MG tablet TAKE ONE TABLET BY MOUTH EVERY MORNING AND TWO TABLETS IN THE EVENING 270 tablet 0   KERENDIA 10 MG TABS Take 1 tablet by mouth daily.     labetalol (NORMODYNE) 300 MG tablet Take 1 tablet (300 mg total) by mouth 2 (two) times daily.     Liniments (SALONPAS PAIN RELIEF PATCH EX) Apply 1 patch topically daily as needed (back pain).     losartan (COZAAR) 100 MG tablet TAKE ONE TABLET BY MOUTH DAILY 90 tablet 0   ONETOUCH ULTRA test strip  CHECK BLOOD SUGAR TWICE DAILY 100 strip 0   OVER THE COUNTER MEDICATION CBD  Gummies 2 each daily     oxyCODONE-acetaminophen (PERCOCET/ROXICET) 5-325 MG tablet Take 1 tablet by mouth 3 (three) times daily as needed.     rosuvastatin (CRESTOR) 20 MG tablet TAKE ONE TABLET BY MOUTH DAILY 90 tablet 0   Triamcinolone Acetonide (NASACORT ALLERGY 24HR NA) Place 1 spray into the nose 2 (two) times daily as needed (congestion).     vitamin B-12 (CYANOCOBALAMIN) 1000 MCG tablet Take 1,000 mcg by mouth daily.     meloxicam (MOBIC) 7.5 MG tablet 1 tablet Orally Once a day     PREVIDENT 5000 DRY MOUTH 1.1 % GEL dental gel Place onto teeth.     Vitamin E 180 MG CAPS Take 180 mg by mouth daily.     No current facility-administered medications for this visit.    REVIEW OF SYSTEMS:  [X]  denotes positive finding, [ ]  denotes negative finding Cardiac  Comments:  Chest pain or chest pressure:    Shortness of breath upon exertion:    Short of breath when lying flat:    Irregular heart rhythm:        Vascular    Pain in calf, thigh, or hip brought on by ambulation:    Pain in feet at night that wakes you up from your sleep:     Blood clot in your veins:    Leg swelling:         Pulmonary    Oxygen at home:    Productive cough:     Wheezing:         Neurologic    Sudden weakness in arms or legs:     Sudden numbness in arms or legs:     Sudden onset of difficulty speaking or slurred speech:    Temporary loss of vision in one eye:     Problems with dizziness:         Gastrointestinal    Blood in stool:     Vomited blood:         Genitourinary    Burning when urinating:     Blood in urine:        Psychiatric    Major depression:         Hematologic    Bleeding problems:    Problems with blood clotting too easily:        Skin    Rashes or ulcers:        Constitutional    Fever or chills:      PHYSICAL EXAM: Vitals:   03/10/23 1420  BP: 127/74  Pulse: 92  Resp: 14  Temp: 97.8 F (36.6 C)  TempSrc: Temporal  SpO2: 96%  Weight: 156 lb (70.8 kg)   Height: 5\' 4"  (1.626 m)    GENERAL: The patient is a well-nourished female, in no acute distress. The vital signs are documented above. CARDIAC: There is a regular rate and rhythm.  VASCULAR:  1+ palpable femoral pulses bilaterally No palpable pedal pulses PULMONARY: No respiratory distress. ABDOMEN: Soft and non-tender.  No pain with palpation of aneurysm. MUSCULOSKELETAL: There are no major deformities or cyanosis. NEUROLOGIC: No focal weakness or paresthesias are detected. PSYCHIATRIC: The patient has a normal affect.  DATA:   CT abdomen pelvis 03/09/23 reviewed and by my measurement her AAA stable at 5.4 cm over 6 months.  Assessment/Plan:  82 year old female with multiple comorbidities including stage IV chronic kidney disease that  presents for evaluation and continued surveillance of her 5.4 cm abdominal aortic aneurysm.  I discussed that her repeat CT scan shows a stable 5.4 cm aneurysm.  I again discussed that at the size of her AAA we would normally recommend repair as we recommend repair greater than 5 cm in women given risk of rupture.  I have quoted her a 10 to 15% rupture risk on a yearly basis.  She has several complicating factors including very diseased calcified small iliac arteries as well as her chronic kidney disease stage IV that would make her high risk for complications including needing dialysis.  I discussed all this with the patient today and her husband.  She is wanting to seriously consider repair.  I do not think she is an open surgical candidate.  I think options are likely in AUI with femorofemoral bypass versus a traditional stent graft again limited by her severely calcified and small iliac arteries.  As a result, I recommended an angiogram with CO2 in the Cath Lab to further evaluate options for access and repair as her CT scans have been without contrast limiting evaluation.  We answered a number of questions for her today and they want to think about their  options.  I will do a phone visit next week once they have decided.  I discussed the other option is continued surveillance with another image in 6 months again with some risk of rupture.   Cephus Shelling, MD [facility] Office: [phone]

## 2023-03-10 NOTE — Telephone Encounter (Signed)
Contacted [name] to schedule their annual wellness visit. Appointment made for 03/19/2023.  [facility] Care Guide [facility] AW[facility] TEAM Direct Dial: [phone]

## 2023-03-11 ENCOUNTER — Other Ambulatory Visit: Payer: Self-pay | Admitting: Internal Medicine

## 2023-03-12 ENCOUNTER — Other Ambulatory Visit: Payer: Self-pay | Admitting: Internal Medicine

## 2023-03-16 ENCOUNTER — Encounter: Payer: Self-pay | Admitting: Internal Medicine

## 2023-03-16 NOTE — Patient Instructions (Addendum)
      Blood work was ordered.   The lab is on the first floor.    Medications changes include :   none    Return in about 6 months (around 09/17/2023) for Physical Exam.

## 2023-03-16 NOTE — Progress Notes (Unsigned)
Subjective:    Patient ID: Sheila Melton, female    DOB: 1941/04/15, 82 y.o.   MRN: [id number]     HPI Sheila Melton is here for follow up of her chronic medical problems.  May have further evaluation of AAA with vascular - considering repair if needed  Fallen 3 days in past week - ? From percocet - she tries not to take the percocet, but it helps with the pain and nothing else does.   Medications and allergies reviewed with patient and updated if appropriate.  Current Outpatient Medications on File Prior to Visit  Medication Sig Dispense Refill   ascorbic Acid (VITAMIN C) 500 MG CPCR 1 cap PO QD     aspirin EC 81 MG tablet Take 81 mg by mouth daily. Swallow whole.     B Complex-C (SUPER B COMPLEX PO) Take 1 tablet by mouth daily.      Blood Glucose Monitoring Suppl (ONE TOUCH ULTRA 2) w/Device KIT Use as advised 1 each 0   calcium carbonate (TUMS - DOSED IN MG ELEMENTAL CALCIUM) 500 MG chewable tablet Chew 1 tablet by mouth daily as needed for indigestion or heartburn.     Cholecalciferol (VITAMIN D3) 125 MCG (5000 UT) TABS Take 5,000 Units by mouth daily.     diazepam (VALIUM) 5 MG tablet TAKE ONE TABLET BY MOUTH AT BEDTIME AS NEEDED. 30 tablet 5   Dulaglutide (TRULICITY) 1.5 MG/0.5ML SOPN Inject 1.5 mg into the skin once a week. 2 mL 3   fexofenadine (ALLEGRA) 180 MG tablet Take 180 mg by mouth at bedtime.     FLUoxetine (PROZAC) 10 MG capsule TAKE ONE CAPSULE BY MOUTH DAILY 90 capsule 2   furosemide (LASIX) 40 MG tablet TAKE 1/2 TO 1 TABLET BY MOUTH DAILY AS NEEDED FOR FLUID 90 tablet 0   glipiZIDE (GLUCOTROL) 5 MG tablet TAKE ONE TABLET BY MOUTH EVERY MORNING and TAKE TWO TABLETS EVERY EVENING 270 tablet 0   KERENDIA 10 MG TABS Take 1 tablet by mouth daily.     labetalol (NORMODYNE) 300 MG tablet Take 1 tablet (300 mg total) by mouth 2 (two) times daily.     Liniments (SALONPAS PAIN RELIEF PATCH EX) Apply 1 patch topically daily as needed (back pain).     losartan (COZAAR) 100 MG  tablet TAKE ONE TABLET BY MOUTH DAILY 90 tablet 0   ONETOUCH ULTRA test strip CHECK BLOOD SUGAR TWICE DAILY 100 strip 0   OVER THE COUNTER MEDICATION CBD Gummies 2 each daily     oxyCODONE-acetaminophen (PERCOCET/ROXICET) 5-325 MG tablet Take 1 tablet by mouth 3 (three) times daily as needed.     rosuvastatin (CRESTOR) 20 MG tablet TAKE ONE TABLET BY MOUTH DAILY 90 tablet 0   Triamcinolone Acetonide (NASACORT ALLERGY 24HR NA) Place 1 spray into the nose 2 (two) times daily as needed (congestion).     vitamin B-12 (CYANOCOBALAMIN) 1000 MCG tablet Take 1,000 mcg by mouth daily.     No current facility-administered medications on file prior to visit.     Review of Systems  Constitutional:  Negative for fever.  Respiratory:  Negative for cough, shortness of breath and wheezing.   Cardiovascular:  Negative for chest pain, palpitations and leg swelling.  Neurological:  Positive for light-headedness (occ when she gets up). Negative for headaches.       Objective:   Vitals:   03/17/23 1521  BP: 130/80  Pulse: 80  Temp: 98.3 F (36.8 C)  SpO2: 96%   BP Readings from Last 3 Encounters:  03/17/23 130/80  03/10/23 127/74  12/23/22 (!) 88/53   Wt Readings from Last 3 Encounters:  03/17/23 155 lb 9.6 oz (70.6 kg)  03/10/23 156 lb (70.8 kg)  12/23/22 147 lb 12.8 oz (67 kg)   Body mass index is 26.71 kg/m.    Physical Exam Constitutional:      General: She is not in acute distress.    Appearance: Normal appearance.  HENT:     Head: Normocephalic and atraumatic.  Eyes:     Conjunctiva/sclera: Conjunctivae normal.  Cardiovascular:     Rate and Rhythm: Normal rate and regular rhythm.     Heart sounds: Normal heart sounds.  Pulmonary:     Effort: Pulmonary effort is normal. No respiratory distress.     Breath sounds: Normal breath sounds. No wheezing.  Musculoskeletal:     Cervical back: Neck supple.     Right lower leg: No edema.     Left lower leg: No edema.   Lymphadenopathy:     Cervical: No cervical adenopathy.  Skin:    General: Skin is warm and dry.     Findings: No rash.  Neurological:     Mental Status: She is alert. Mental status is at baseline.  Psychiatric:        Mood and Affect: Mood normal.        Behavior: Behavior normal.        Lab Results  Component Value Date   WBC 11.3 (H) 08/21/2021   HGB 10.7 (L) 08/21/2021   HCT 32.5 (L) 08/21/2021   PLT 328.0 08/21/2021   GLUCOSE 177 (H) 08/21/2021   CHOL 153 08/21/2021   TRIG 342.0 (H) 08/21/2021   HDL 40.40 08/21/2021   LDLDIRECT 59.0 08/21/2021   LDLCALC 70 05/21/2020   ALT 13 08/21/2021   AST 18 08/21/2021   NA 139 08/21/2021   K 3.9 08/21/2021   CL 105 08/21/2021   CREATININE 2.59 (H) 08/21/2021   BUN 38 (H) 08/21/2021   CO2 25 08/21/2021   TSH 3.01 11/21/2020   INR 0.95 02/12/2017   HGBA1C 6.4 (A) 09/16/2022   MICROALBUR 194.0 Verified by manual dilution. (H) 11/16/2014     Assessment & Plan:    See Problem List for Assessment and Plan of chronic medical problems.

## 2023-03-17 ENCOUNTER — Ambulatory Visit (INDEPENDENT_AMBULATORY_CARE_PROVIDER_SITE_OTHER): Payer: PPO | Admitting: Vascular Surgery

## 2023-03-17 ENCOUNTER — Ambulatory Visit (INDEPENDENT_AMBULATORY_CARE_PROVIDER_SITE_OTHER): Payer: PPO | Admitting: Internal Medicine

## 2023-03-17 VITALS — BP 130/80 | HR 80 | Temp 98.3°F | Ht 64.0 in | Wt 155.6 lb

## 2023-03-17 DIAGNOSIS — G479 Sleep disorder, unspecified: Secondary | ICD-10-CM

## 2023-03-17 DIAGNOSIS — F3289 Other specified depressive episodes: Secondary | ICD-10-CM | POA: Diagnosis not present

## 2023-03-17 DIAGNOSIS — E782 Mixed hyperlipidemia: Secondary | ICD-10-CM

## 2023-03-17 DIAGNOSIS — I10 Essential (primary) hypertension: Secondary | ICD-10-CM | POA: Diagnosis not present

## 2023-03-17 DIAGNOSIS — E1159 Type 2 diabetes mellitus with other circulatory complications: Secondary | ICD-10-CM

## 2023-03-17 DIAGNOSIS — Z7984 Long term (current) use of oral hypoglycemic drugs: Secondary | ICD-10-CM

## 2023-03-17 DIAGNOSIS — N184 Chronic kidney disease, stage 4 (severe): Secondary | ICD-10-CM

## 2023-03-17 DIAGNOSIS — F411 Generalized anxiety disorder: Secondary | ICD-10-CM | POA: Diagnosis not present

## 2023-03-17 DIAGNOSIS — D692 Other nonthrombocytopenic purpura: Secondary | ICD-10-CM

## 2023-03-17 DIAGNOSIS — I7143 Infrarenal abdominal aortic aneurysm, without rupture: Secondary | ICD-10-CM | POA: Diagnosis not present

## 2023-03-17 MED ORDER — LOSARTAN POTASSIUM 100 MG PO TABS: 100.0000 mg | ORAL_TABLET | Freq: Every day | ORAL | 2 refills | Status: DC

## 2023-03-17 MED ORDER — ONETOUCH ULTRA VI STRP: ORAL_STRIP | 3 refills | Status: AC

## 2023-03-17 NOTE — Assessment & Plan Note (Signed)
Has painless bruising on her forearms Reassured her this was related to thin skin and medications likely contributing 

## 2023-03-17 NOTE — Assessment & Plan Note (Signed)
Chronic Lipid panel Continue Crestor 20 mg daily

## 2023-03-17 NOTE — Assessment & Plan Note (Signed)
Chronic   Lab Results  Component Value Date   HGBA1C 6.4 (A) 09/16/2022   Sugars  controlled Check A1c, urine microalbumin today Continue Trulicity 1.5 mg weekly, glipizide 5 mg q am, 10 mg q pm Stressed diabetic diet

## 2023-03-17 NOTE — Assessment & Plan Note (Signed)
Chronic Controlled, Stable Continue fluoxetine 10 mg daily 

## 2023-03-17 NOTE — Assessment & Plan Note (Signed)
Chronic Blood pressure well controlled cmp Continue labetalol 300 mg twice daily, losartan 100 mg daily

## 2023-03-17 NOTE — Progress Notes (Signed)
Virtual Visit via Telephone Note    I connected with Sheila Melton on 03/17/2023 using the [email or web address] by telephone and verified that I was speaking with the correct person using two identifiers. Patient was located at home and accompanied by husband and daughter. I am located at [facility].   The limitations of evaluation and management by telemedicine and the availability of in person appointments have been previously discussed with the patient and are documented in the patients chart. The patient expressed understanding and consented to proceed.  PCP: Pincus Sanes, MD   Chief Complaint: Discuss EVAR for repair of AAA  History of Present Illness: Sheila Melton is a 82 y.o. female  with hx chronic back pain with spinal stimulator, HTN, HLD, stage IV CKD, DM that presents to discuss repair of her AAA.  Her aneurysm has been followed by Dr. Arbie Cookey.  She had previously been followed by Dr. Arbie Cookey up to 5 cm and he recommended follow-up with me.  Her AAA last measured 5.4 cm approximately 6 months ago.    Her repeat imaging on 03/09/2023 again showed a stable aneurysm by my measurement around 5.4 cm.    Past Medical History:  Diagnosis Date   AAA (abdominal aortic aneurysm) (HCC)    BEING WATCHED   [facility]. 4.2 cm infrarenal abdominal aortic aneurysm without rupture noted 11/08/2016   Arthritis    Cancer (HCC)    skin cancer removed from left shoulder  2017   CAP (community acquired pneumonia) 09/02/13-11/05/13    with SIRS   Depression    ?? from prednisone    not on pred now   Diabetes mellitus    type ii    long time ago.   Family history of anesthesia complication    PATIENTS MOM HAD TROUBLE WAKING UP    GERD (gastroesophageal reflux disease)    will take occasional tums   Heart murmur    still has.   Sees Dr. 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 2013   VIDEO ASSISTED THORACOSCOPY (VATS)/ LOBECTOMY Right 02/16/2017   Procedure: Right VIDEO ASSISTED THORACOSCOPY (VATS)/ Right Middle LOBECTOMY;  Surgeon: Loreli Slot, MD;  Location: [facility] OR;  Service: Thoracic;  Laterality: Right;    No outpatient medications have been marked as taking for the 03/17/23 encounter (Appointment) with Cephus Shelling, MD.    12 system ROS was negative unless otherwise noted in HPI   Observations/Objective:  CT abdomen pelvis 03/09/23 reviewed and by my measurement her AAA stable at 5.4 cm over 6 months.   Assessment and Plan:   82 year old female with multiple comorbidities including stage  IV chronic kidney disease that presents to discuss her 5.4 cm abdominal aortic aneurysm.  Previously discussed last week that her CT scan shows a stable 5.4 cm aneurysm over the past 6 months.  Discussed again we would recommend repair greater than 5 cm in women given risk of rupture.  She has a current rupture risk of 10 to 15% a year which I quoted them again today.  Again remains high risk given her stage IV kidney disease as well as her difficult iliac access that could complicate endograft repair.  I reviewed her images with my partners as well as one of our reps and I do think she potentially has an endograft option.  I discussed the option of proceeding with endograft repair versus continued observation.  I do think there is an inherent risk of her worsening kidney function  ultimately requiring dialysis.  I discussed risk of iliac artery injury.  Discussed if we cannot proceed safely with getting the stent graft delivered we could abort as I think she would be a very poor open surgical candidate.  She wants to further discuss with her nephrologist and talk to her family.  She will let us know.  Follow Up Instructions:   Follow up: Patient will contact our office when she decides on aneurysm repair and/or follow-up   I discussed the assessment and treatment plan with the patient. The patient was provided an opportunity to ask questions and all were answered. The patient agreed with the plan and demonstrated an understanding of the instructions.   The patient was advised to call back or seek an in-person evaluation if the symptoms worsen or if the condition fails to improve as anticipated.  I spent 15 minutes with the patient via telephone encounter.   Signed, Cephus Shelling [facility] Office: [phone]  03/17/2023, 4:45 PM

## 2023-03-17 NOTE — Assessment & Plan Note (Signed)
Chronic °Controlled, Stable °Continue diazepam 5 mg nightly °

## 2023-03-17 NOTE — Assessment & Plan Note (Signed)
Chronic Following with Dr. Allena Katz On [name] Sugars well controlled Blood pressure well controlled

## 2023-03-19 LAB — CBC WITH DIFFERENTIAL/PLATELET
Basophils Absolute: 0.1 10*3/uL (ref 0.0–0.1)
Basophils Relative: 0.7 % (ref 0.0–3.0)
Eosinophils Absolute: 0.5 10*3/uL (ref 0.0–0.7)
Eosinophils Relative: 3.9 % (ref 0.0–5.0)
HCT: 32.5 % — ABNORMAL LOW (ref 36.0–46.0)
Hemoglobin: 10.8 g/dL — ABNORMAL LOW (ref 12.0–15.0)
Lymphocytes Relative: 26.4 % (ref 12.0–46.0)
Lymphs Abs: 3.1 10*3/uL (ref 0.7–4.0)
MCHC: 33.3 g/dL (ref 30.0–36.0)
MCV: 89.2 fl (ref 78.0–100.0)
Monocytes Absolute: 1.2 10*3/uL — ABNORMAL HIGH (ref 0.1–1.0)
Monocytes Relative: 10.1 % (ref 3.0–12.0)
Neutro Abs: 6.9 10*3/uL (ref 1.4–7.7)
Neutrophils Relative %: 58.9 % (ref 43.0–77.0)
Platelets: 293 10*3/uL (ref 150.0–400.0)
RBC: 3.64 Mil/uL — ABNORMAL LOW (ref 3.87–5.11)
RDW: 13.2 % (ref 11.5–15.5)
WBC: 11.7 10*3/uL — ABNORMAL HIGH (ref 4.0–10.5)

## 2023-03-19 LAB — COMPREHENSIVE METABOLIC PANEL
ALT: 11 U/L (ref 0–35)
AST: 15 U/L (ref 0–37)
Albumin: 4 g/dL (ref 3.5–5.2)
Alkaline Phosphatase: 78 U/L (ref 39–117)
BUN: 42 mg/dL — ABNORMAL HIGH (ref 6–23)
CO2: 24 mEq/L (ref 19–32)
Calcium: 9.2 mg/dL (ref 8.4–10.5)
Chloride: 104 mEq/L (ref 96–112)
Creatinine, Ser: 2.84 mg/dL — ABNORMAL HIGH (ref 0.40–1.20)
GFR: 15.24 mL/min — ABNORMAL LOW (ref >60.00)
Glucose, Bld: 170 mg/dL — ABNORMAL HIGH (ref 70–99)
Potassium: 4.1 mEq/L (ref 3.5–5.1)
Sodium: 139 mEq/L (ref 135–145)
Total Bilirubin: 0.4 mg/dL (ref 0.2–1.2)
Total Protein: 7 g/dL (ref 6.0–8.3)

## 2023-03-19 LAB — LIPID PANEL
Cholesterol: 127 mg/dL (ref 0–200)
HDL: 37.4 mg/dL — ABNORMAL LOW (ref >39.00)
NonHDL: 89.15
Total CHOL/HDL Ratio: 3
Triglycerides: 260 mg/dL — ABNORMAL HIGH (ref 0.0–149.0)
VLDL: 52 mg/dL — ABNORMAL HIGH (ref 0.0–40.0)

## 2023-03-19 LAB — MICROALBUMIN / CREATININE URINE RATIO
Creatinine,U: 78.9 mg/dL
Microalb Creat Ratio: 165.7 mg/g — ABNORMAL HIGH (ref 0.0–30.0)
Microalb, Ur: 130.8 mg/dL — ABNORMAL HIGH (ref 0.0–1.9)

## 2023-03-19 LAB — HEMOGLOBIN A1C: Hgb A1c MFr Bld: 6.9 % — ABNORMAL HIGH (ref 4.6–6.5)

## 2023-03-19 LAB — LDL CHOLESTEROL, DIRECT: Direct LDL: 47 mg/dL

## 2023-03-26 ENCOUNTER — Other Ambulatory Visit (HOSPITAL_COMMUNITY): Payer: Self-pay

## 2023-04-08 DIAGNOSIS — D225 Melanocytic nevi of trunk: Secondary | ICD-10-CM | POA: Diagnosis not present

## 2023-04-08 DIAGNOSIS — L578 Other skin changes due to chronic exposure to nonionizing radiation: Secondary | ICD-10-CM | POA: Diagnosis not present

## 2023-04-08 DIAGNOSIS — L814 Other melanin hyperpigmentation: Secondary | ICD-10-CM | POA: Diagnosis not present

## 2023-04-08 DIAGNOSIS — Z85828 Personal history of other malignant neoplasm of skin: Secondary | ICD-10-CM | POA: Diagnosis not present

## 2023-04-08 DIAGNOSIS — L821 Other seborrheic keratosis: Secondary | ICD-10-CM | POA: Diagnosis not present

## 2023-04-24 ENCOUNTER — Other Ambulatory Visit (HOSPITAL_COMMUNITY): Payer: Self-pay

## 2023-04-29 DIAGNOSIS — N184 Chronic kidney disease, stage 4 (severe): Secondary | ICD-10-CM | POA: Diagnosis not present

## 2023-05-07 DIAGNOSIS — D631 Anemia in chronic kidney disease: Secondary | ICD-10-CM | POA: Diagnosis not present

## 2023-05-07 DIAGNOSIS — N2581 Secondary hyperparathyroidism of renal origin: Secondary | ICD-10-CM | POA: Diagnosis not present

## 2023-05-07 DIAGNOSIS — E1122 Type 2 diabetes mellitus with diabetic chronic kidney disease: Secondary | ICD-10-CM | POA: Diagnosis not present

## 2023-05-07 DIAGNOSIS — I129 Hypertensive chronic kidney disease with stage 1 through stage 4 chronic kidney disease, or unspecified chronic kidney disease: Secondary | ICD-10-CM | POA: Diagnosis not present

## 2023-05-07 DIAGNOSIS — N184 Chronic kidney disease, stage 4 (severe): Secondary | ICD-10-CM | POA: Diagnosis not present

## 2023-05-12 DIAGNOSIS — F1120 Opioid dependence, uncomplicated: Secondary | ICD-10-CM | POA: Diagnosis not present

## 2023-05-12 DIAGNOSIS — Z9689 Presence of other specified functional implants: Secondary | ICD-10-CM | POA: Diagnosis not present

## 2023-05-12 DIAGNOSIS — M961 Postlaminectomy syndrome, not elsewhere classified: Secondary | ICD-10-CM | POA: Diagnosis not present

## 2023-05-12 DIAGNOSIS — M542 Cervicalgia: Secondary | ICD-10-CM | POA: Diagnosis not present

## 2023-05-20 ENCOUNTER — Other Ambulatory Visit (HOSPITAL_COMMUNITY): Payer: Self-pay

## 2023-06-09 ENCOUNTER — Other Ambulatory Visit: Payer: Self-pay | Admitting: Internal Medicine

## 2023-06-18 ENCOUNTER — Other Ambulatory Visit: Payer: Self-pay | Admitting: Internal Medicine

## 2023-06-18 ENCOUNTER — Other Ambulatory Visit (HOSPITAL_COMMUNITY): Payer: Self-pay

## 2023-06-18 MED ORDER — TRULICITY 1.5 MG/0.5ML ~~LOC~~ SOAJ
1.5000 mg | SUBCUTANEOUS | 3 refills | Status: DC
  Filled 2023-06-18: qty 2, 28d supply, fill #0
  Filled 2023-07-17: qty 2, 28d supply, fill #1
  Filled 2023-08-13: qty 2, 28d supply, fill #2
  Filled 2023-09-10: qty 2, 28d supply, fill #3

## 2023-06-22 ENCOUNTER — Ambulatory Visit: Payer: PPO

## 2023-06-22 VITALS — BP 130/80 | HR 80 | Ht 64.0 in | Wt 155.0 lb

## 2023-06-22 DIAGNOSIS — Z0000 Encounter for general adult medical examination without abnormal findings: Secondary | ICD-10-CM | POA: Diagnosis not present

## 2023-06-22 NOTE — Progress Notes (Signed)
Subjective:   Sheila Melton is a 82 y.o. female who presents for Medicare Annual (Subsequent) preventive examination.  Visit Complete: Virtual  I connected with  Sheila Melton on 06/22/23 by a audio enabled telemedicine application and verified that I am speaking with the correct person using two identifiers.  Patient Location: Home  Provider Location: Office/Clinic  I discussed the limitations of evaluation and management by telemedicine. The patient expressed understanding and agreed to proceed.  Patient Medicare AWV questionnaire was completed by the patient on N/A; I have confirmed that all information answered by patient is correct and no changes since this date.  Review of Systems    No ROS. Medicare Wellness Telephone Visit. Additional risk factors are reflected in social history. Cardiac Risk Factors include: advanced age (>40men, >69 women);diabetes mellitus;dyslipidemia;family history of premature cardiovascular disease;hypertension;smoking/ tobacco exposure     Objective:    Today's Vitals   06/22/23 1117 06/22/23 1121  BP: 130/80   Pulse: 80   SpO2: 96%   Weight: 155 lb (70.3 kg)   Height: 5\' 4"  (1.626 m)   PainSc:  1    Body mass index is 26.61 kg/m.  Patient was unable to self-report vitals due to a lack of equipment at home via telehealth.     06/22/2023   11:29 AM 07/10/2021    3:00 PM 09/14/2020   10:47 AM 12/13/2019    8:52 AM 02/21/2019   10:01 AM 08/18/2017    9:32 AM 06/25/2017    2:30 PM  Advanced Directives  Does Patient Have a Medical Advance Directive? Yes Yes Yes Yes Yes Yes Yes  Type of Estate agent of [place];Living will Healthcare Power of [place];Living will Healthcare Power of [place];Living will Healthcare Power of [place];Living will Healthcare Power of [place];Living will Healthcare Power of [place];Living will Healthcare Power of [place];Living will  Does patient want to make changes to medical advance  directive? No - Patient declined  No - Patient declined No - Patient declined     Copy of Healthcare Power of Attorney in Chart? No - copy requested  No - copy requested    No - copy requested    Current Medications (verified) Outpatient Encounter Medications as of 06/22/2023  Medication Sig   ascorbic Acid (VITAMIN C) 500 MG CPCR 1 cap PO QD   B Complex-C (SUPER B COMPLEX PO) Take 1 tablet by mouth daily.    Blood Glucose Monitoring Suppl (ONE TOUCH ULTRA 2) w/Device KIT Use as advised   calcium carbonate (TUMS - DOSED IN MG ELEMENTAL CALCIUM) 500 MG chewable tablet Chew 1 tablet by mouth daily as needed for indigestion or heartburn.   Cholecalciferol (VITAMIN D3) 125 MCG (5000 UT) TABS Take 5,000 Units by mouth daily.   diazepam (VALIUM) 5 MG tablet TAKE ONE TABLET BY MOUTH AT BEDTIME AS NEEDED.   Dulaglutide (TRULICITY) 1.5 MG/0.5ML SOPN Inject 1.5 mg into the skin once a week.   fexofenadine (ALLEGRA) 180 MG tablet Take 180 mg by mouth at bedtime.   FLUoxetine (PROZAC) 10 MG capsule TAKE ONE CAPSULE BY MOUTH DAILY   furosemide (LASIX) 40 MG tablet TAKE 1/2 TO 1 TABLET BY MOUTH DAILY AS NEEDED FOR FLUID   glipiZIDE (GLUCOTROL) 5 MG tablet TAKE ONE TABLET BY MOUTH EVERY MORNING and TAKE TWO TABLETS EVERY EVENING   glucose blood (ONETOUCH ULTRA) test strip CHECK BLOOD SUGAR TWICE DAILY   KERENDIA 10 MG TABS Take 1 tablet by mouth daily.   labetalol (  NORMODYNE) 300 MG tablet Take 1 tablet (300 mg total) by mouth 2 (two) times daily.   Liniments (SALONPAS PAIN RELIEF PATCH EX) Apply 1 patch topically daily as needed (back pain).   losartan (COZAAR) 100 MG tablet Take 1 tablet (100 mg total) by mouth daily.   OVER THE COUNTER MEDICATION CBD Gummies 2 each daily   oxyCODONE-acetaminophen (PERCOCET/ROXICET) 5-325 MG tablet Take 1 tablet by mouth 3 (three) times daily as needed.   rosuvastatin (CRESTOR) 20 MG tablet TAKE ONE TABLET BY MOUTH DAILY   Triamcinolone Acetonide (NASACORT ALLERGY 24HR  NA) Place 1 spray into the nose 2 (two) times daily as needed (congestion).   vitamin B-12 (CYANOCOBALAMIN) 1000 MCG tablet Take 1,000 mcg by mouth daily.   vitamin E 180 MG (400 UNITS) capsule Take 400 Units by mouth daily.   [DISCONTINUED] aspirin EC 81 MG tablet Take 81 mg by mouth daily. Swallow whole. (Patient not taking: Reported on 06/22/2023)   No facility-administered encounter medications on file as of 06/22/2023.    Allergies (verified) Rocephin [ceftriaxone sodium in dextrose], Amoxicillin, Captopril, Simvastatin, Ciprofloxacin, Semaglutide, Adhesive [tape], and Latex   History: Past Medical History:  Diagnosis Date   AAA (abdominal aortic aneurysm) (HCC)    BEING WATCHED   VVS. 4.2 cm infrarenal abdominal aortic aneurysm without rupture noted 11/08/2016   Arthritis    Cancer (HCC)    skin cancer removed from left shoulder  2017   CAP (community acquired pneumonia) 09/02/13-11/05/13    with SIRS   Depression    ?? from prednisone    not on pred now   Diabetes mellitus    type ii    long time ago.   Family history of anesthesia complication    PATIENTS MOM HAD TROUBLE WAKING UP    GERD (gastroesophageal reflux disease)    will take occasional tums   Heart murmur    still has.   Sees Dr. Jens Som   History of kidney stones    has them at the present time   Hyperlipidemia    Hypertension    unspecified essential   Renal insufficiency    "FSGS"   Subclavian steal syndrome    LEFT SIDE   NO SIDE EFFECTS   Subclavian steal syndrome --  left    85% blocked   UTI (lower urinary tract infection) 08/26/13   Enterococcus and Escherichia coli both sensitive to nitrofurantoin   Past Surgical History:  Procedure Laterality Date   ABDOMINAL HYSTERECTOMY     BACK SURGERY     CATARACT EXTRACTION     OS   CYST EXCISION Right 05-23-13   Right index finger: cyst   DILATION AND CURETTAGE OF UTERUS     EYE SURGERY     g1 p1     SPINE SURGERY  Sept. 2013   VIDEO ASSISTED  THORACOSCOPY (VATS)/ LOBECTOMY Right 02/16/2017   Procedure: Right VIDEO ASSISTED THORACOSCOPY (VATS)/ Right Middle LOBECTOMY;  Surgeon: Loreli Slot, MD;  Location: [facility] OR;  Service: Thoracic;  Laterality: Right;   Family History  Problem Relation Age of Onset   Heart failure Mother    Heart disease Mother    Hypertension Mother    Other Mother        varicose veins   Deep vein thrombosis Mother    Varicose Veins Mother    Heart attack Mother    Hypertension Sister        X 2   Diabetes Sister  Hyperlipidemia Sister    Other Sister        varicose veins   Heart disease Father    Diabetes Father    Hyperlipidemia Father    Hypertension Father    Heart attack Father    Heart disease Brother        MI in late 11s   Hyperlipidemia Brother    Heart attack Brother    Coronary artery disease Other    Diabetes Other    Parkinsonism Brother    Heart failure Brother    Hypertension Daughter    Social History   Socioeconomic History   Marital status: Married    Spouse name: Not on file   Number of children: 1   Years of education: Not on file   Highest education level: 12th grade  Occupational History   Not on file  Tobacco Use   Smoking status: Light Smoker    Current packs/day: 1.00    Average packs/day: 1 pack/day for 50.0 years (50.0 ttl pk-yrs)    Types: E-cigarettes, Cigarettes   Smokeless tobacco: Never   Tobacco comments:    e-cigs  Vaping Use   Vaping status: Never Used  Substance and Sexual Activity   Alcohol use: No   Drug use: No   Sexual activity: Not on file  Other Topics Concern   Not on file  Social History Narrative   Has 3 caffeinated bev a day      Exercise: none   Social Determinants of Health   Financial Resource Strain: Low Risk  (06/22/2023)   Overall Financial Resource Strain (CARDIA)    Difficulty of Paying Living Expenses: Not very hard  Food Insecurity: No Food Insecurity (06/22/2023)   Hunger Vital Sign    Worried About  Running Out of Food in the Last Year: Never true    Ran Out of Food in the Last Year: Never true  Transportation Needs: No Transportation Needs (06/22/2023)   PRAPARE - Administrator, Civil Service (Medical): No    Lack of Transportation (Non-Medical): No  Physical Activity: Inactive (06/22/2023)   Exercise Vital Sign    Days of Exercise per Week: 0 days    Minutes of Exercise per Session: 0 min  Stress: No Stress Concern Present (06/22/2023)   [employer] of Occupational Health - Occupational Stress Questionnaire    Feeling of Stress : Not at all  Social Connections: Moderately Isolated (06/22/2023)   Social Connection and Isolation Panel [NHANES]    Frequency of Communication with Friends and Family: More than three times a week    Frequency of Social Gatherings with Friends and Family: Once a week    Attends Religious Services: Never    Database administrator or Organizations: No    Attends Engineer, structural: Never    Marital Status: Married    Tobacco Counseling Ready to quit: Not Answered Counseling given: Not Answered Tobacco comments: e-cigs   Clinical Intake:  Pre-visit preparation completed: Yes  Pain : 0-10 Pain Score: 1  (at this time only because she is not moving) Pain Type: Chronic pain Pain Location: Back Pain Descriptors / Indicators: Constant Pain Onset: More than a month ago Pain Frequency: Constant Pain Relieving Factors: pain medication Effect of Pain on Daily Activities: pain when moving around or walking  Pain Relieving Factors: pain medication  BMI - recorded: 26 Nutritional Status: BMI 25 -29 Overweight Nutritional Risks: None Diabetes: Yes CBG done?: No Did pt. bring  in CBG monitor from home?: No  How often do you need to have someone help you when you read instructions, pamphlets, or other written materials from your doctor or pharmacy?: 1 - Never What is the last grade level you completed in school?: 12th  grade  Interpreter Needed?: No  Information entered by :: Elyse Jarvis, CMA   Activities of Daily Living    06/22/2023   11:29 AM  In your present state of health, do you have any difficulty performing the following activities:  Hearing? 0  Vision? 0  Difficulty concentrating or making decisions? 0  Walking or climbing stairs? 0  Dressing or bathing? 0  Doing errands, shopping? 1  Comment only due to pain  Preparing Food and eating ? N  Using the Toilet? N  In the past six months, have you accidently leaked urine? N  Do you have problems with loss of bowel control? N  Managing your Medications? N  Managing your Finances? N  Housekeeping or managing your Housekeeping? N    Patient Care Team: Pincus Sanes, MD as PCP - General (Internal Medicine) Iran Ouch, MD as PCP - Cardiology (Cardiology) Dagoberto Ligas, MD as Consulting Physician (Nephrology) Shea Evans, [facility])  Indicate any recent Medical Services you may have received from other than [facility] providers in the past year (date may be approximate).     Assessment:   This is a routine wellness examination for Carolinda.  Hearing/Vision screen Patient denied any hearing difficulty. No hearing aids. Patient does not wear any corrective lenses/contacts.  Dietary issues and exercise activities discussed:     Goals Addressed             This Visit's Progress    Patient Stated       I would like to try starting acupuncture to see if it will help relieve pain in my back.        Depression Screen    06/22/2023   11:28 AM 03/17/2023    3:27 PM 09/16/2022    3:04 PM 08/11/2022    9:56 AM 07/21/2022    3:21 PM 03/24/2022    3:01 PM 03/19/2022    2:09 PM  PHQ 2/9 Scores  PHQ - 2 Score 1 0 1 0 5 2 2   PHQ- 9 Score  0    3 4    Fall Risk    06/22/2023   11:29 AM 03/17/2023    3:27 PM 09/16/2022    3:04 PM 08/11/2022   10:14 AM 08/11/2022    9:56 AM  Fall Risk   Falls in the past year? 0 0 0 0 0  Number  falls in past yr: 0 0 0 0 0  Injury with Fall? 0 0 0 0 0  Risk for fall due to : No Fall Risks No Fall Risks No Fall Risks No Fall Risks No Fall Risks  Follow up Falls evaluation completed Falls evaluation completed Falls evaluation completed Falls evaluation completed Falls evaluation completed    MEDICARE RISK AT HOME:  Medicare Risk at Home - 06/22/23 1130     Any stairs in or around the home? Yes    If so, are there any without handrails? No    Adequate lighting in your home to reduce risk of falls? Yes    Life alert? No    Use of a cane, walker or w/c? No    Grab bars in the bathroom? Yes    Shower  chair or bench in shower? Yes    Elevated toilet seat or a handicapped toilet? No             TIMED UP AND GO:  Was the test performed?  No    Cognitive Function:  Patient is cogitatively intact.      06/22/2023   11:30 AM 03/19/2022    2:13 PM  6CIT Screen  What Year? 0 points 0 points  What month? 0 points 0 points  What time? 0 points 0 points  Count back from 20 0 points 0 points  Months in reverse 0 points 0 points  Repeat phrase 0 points 0 points  Total Score 0 points 0 points    Immunizations Immunization History  Administered Date(s) Administered   Fluad Quad(high Dose 65+) 08/24/2019, 08/21/2020, 08/21/2021, 09/16/2022   Influenza Split 08/28/2011, 09/07/2012   Influenza Whole 09/06/2008, 08/30/2009, 10/11/2010   Influenza, High Dose Seasonal PF 08/24/2013, 08/18/2014, 08/07/2015, 08/08/2016, 07/31/2017, 08/10/2018   PFIZER(Purple Top)SARS-COV-2 Vaccination 11/29/2019, 12/19/2019, 11/16/2020   Pfizer Covid-19 Vaccine Bivalent Booster 64yrs & up 11/16/2020   Pneumococcal Conjugate-13 09/26/2015   Pneumococcal Polysaccharide-23 12/10/2009    TDAP status: Due, Education has been provided regarding the importance of this vaccine. Advised may receive this vaccine at local pharmacy or Health Dept. Aware to provide a copy of the vaccination record if obtained  from local pharmacy or Health Dept. Verbalized acceptance and understanding.  Flu Vaccine status: Due, Education has been provided regarding the importance of this vaccine. Advised may receive this vaccine at local pharmacy or Health Dept. Aware to provide a copy of the vaccination record if obtained from local pharmacy or Health Dept. Verbalized acceptance and understanding.  Pneumococcal vaccine status: Up to date  Covid-19 vaccine status: Completed vaccines  Qualifies for Shingles Vaccine? Yes   Zostavax completed No   Shingrix Completed?: No.    Education has been provided regarding the importance of this vaccine. Patient has been advised to call insurance company to determine out of pocket expense if they have not yet received this vaccine. Advised may also receive vaccine at local pharmacy or Health Dept. Verbalized acceptance and understanding.  Screening Tests Health Maintenance  Topic Date Due   DTaP/Tdap/Td (1 - Tdap) Never done   Lung Cancer Screening  Never done   OPHTHALMOLOGY EXAM  10/29/2021   FOOT EXAM  05/21/2022   INFLUENZA VACCINE  06/11/2023   COVID-19 Vaccine (5 - 2023-24 season) 07/08/2023 (Originally 07/11/2022)   Zoster Vaccines- Shingrix (1 of 2) 08/26/2023 (Originally 12/29/1959)   DEXA SCAN  03/16/2024 (Originally 12/06/2022)   HEMOGLOBIN A1C  09/19/2023   Diabetic kidney evaluation - eGFR measurement  03/18/2024   Diabetic kidney evaluation - Urine ACR  03/18/2024   Medicare Annual Wellness (AWV)  06/21/2024   Pneumonia Vaccine 19+ Years old  Completed   HPV VACCINES  Aged Out    Health Maintenance  Health Maintenance Due  Topic Date Due   DTaP/Tdap/Td (1 - Tdap) Never done   Lung Cancer Screening  Never done   OPHTHALMOLOGY EXAM  10/29/2021   FOOT EXAM  05/21/2022   INFLUENZA VACCINE  06/11/2023    Colorectal cancer screening: No longer required.   Mammogram status: No longer required due to age.  Bone Density status: Completed 12/06/2020. Results  reflect: Bone density results: OSTEOPOROSIS. Repeat every 2 years.  Lung Cancer Screening: (Low Dose CT Chest recommended if Age 61-80 years, 20 pack-year currently smoking OR have quit w/in 15years.) does  qualify.   Lung Cancer Screening Referral: N/A  Additional Screening:  Hepatitis C Screening: does not qualify; Completed N/A  Vision Screening: Recommended annual ophthalmology exams for early detection of glaucoma and other disorders of the eye. Is the patient up to date with their annual eye exam?  Yes  Who is the provider or what is the name of the office in which the patient attends annual eye exams? Dr. Shea Evans If pt is not established with a provider, would they like to be referred to a provider to establish care? No .   Dental Screening: Recommended annual dental exams for proper oral hygiene  Diabetic Foot Exam: Diabetic Foot Exam: Overdue, Pt has been advised about the importance in completing this exam. Pt is scheduled for diabetic foot exam on date of physical 09/22/2023.  Community Resource Referral / Chronic Care Management: CRR required this visit?  No   CCM required this visit?  No     Plan:     I have personally reviewed and noted the following in the patient's chart:   Medical and social history Use of alcohol, tobacco or illicit drugs  Current medications and supplements including opioid prescriptions. Patient is currently taking opioid prescriptions. Information provided to patient regarding non-opioid alternatives. Patient advised to discuss non-opioid treatment plan with their provider. Functional ability and status Nutritional status Physical activity Advanced directives List of other physicians Hospitalizations, surgeries, and ER visits in previous 12 months Vitals Screenings to include cognitive, depression, and falls Referrals and appointments  In addition, I have reviewed and discussed with patient certain preventive protocols, quality metrics,  and best practice recommendations. A written personalized care plan for preventive services as well as general preventive health recommendations were provided to patient.     Marinus Maw, CMA   06/22/2023   After Visit Summary: (MyChart) Due to this being a telephonic visit, the after visit summary with patients personalized plan was offered to patient via MyChart   Nurse Notes: records requested for recent diabetic eye exam

## 2023-06-22 NOTE — Patient Instructions (Signed)
It was great speaking with you today!  Please schedule your next Medicare Wellness Visit with your Nurse Health Advisor in 1 year by calling [phone]. 

## 2023-07-15 DIAGNOSIS — M2555 Pain in hip: Secondary | ICD-10-CM | POA: Diagnosis not present

## 2023-07-15 DIAGNOSIS — M542 Cervicalgia: Secondary | ICD-10-CM | POA: Diagnosis not present

## 2023-07-15 DIAGNOSIS — M5451 Vertebrogenic low back pain: Secondary | ICD-10-CM | POA: Diagnosis not present

## 2023-07-17 ENCOUNTER — Other Ambulatory Visit: Payer: Self-pay | Admitting: Internal Medicine

## 2023-07-17 ENCOUNTER — Other Ambulatory Visit (HOSPITAL_COMMUNITY): Payer: Self-pay

## 2023-07-21 DIAGNOSIS — M1711 Unilateral primary osteoarthritis, right knee: Secondary | ICD-10-CM | POA: Diagnosis not present

## 2023-08-03 ENCOUNTER — Telehealth: Payer: Self-pay

## 2023-08-03 NOTE — Telephone Encounter (Signed)
Pt's husband, Dorinda Hill, contacted coumadin clinic requesting the flu vaccine at apt on 9/27 and that his wife also receive the vaccine. Pt's wife agreed. Scheduled pt for nurse visit on 9/27 and this nurse will provider both pts their flu vaccines.   Pt placed on schedule.

## 2023-08-07 ENCOUNTER — Ambulatory Visit (INDEPENDENT_AMBULATORY_CARE_PROVIDER_SITE_OTHER): Payer: PPO

## 2023-08-07 DIAGNOSIS — Z23 Encounter for immunization: Secondary | ICD-10-CM | POA: Diagnosis not present

## 2023-08-07 NOTE — Progress Notes (Signed)
Pt requested high dose flu vaccine. Administered flu vaccine in right deltoid. Pt tolerated well.

## 2023-08-11 DIAGNOSIS — F1120 Opioid dependence, uncomplicated: Secondary | ICD-10-CM | POA: Diagnosis not present

## 2023-08-11 DIAGNOSIS — Z9689 Presence of other specified functional implants: Secondary | ICD-10-CM | POA: Diagnosis not present

## 2023-08-11 DIAGNOSIS — M961 Postlaminectomy syndrome, not elsewhere classified: Secondary | ICD-10-CM | POA: Diagnosis not present

## 2023-08-11 DIAGNOSIS — M542 Cervicalgia: Secondary | ICD-10-CM | POA: Diagnosis not present

## 2023-08-13 ENCOUNTER — Other Ambulatory Visit (HOSPITAL_COMMUNITY): Payer: Self-pay

## 2023-09-02 DIAGNOSIS — I714 Abdominal aortic aneurysm, without rupture, unspecified: Secondary | ICD-10-CM | POA: Diagnosis not present

## 2023-09-03 LAB — BUN+CREAT
BUN/Creatinine Ratio: 15 (ref 12–28)
BUN: 47 mg/dL — ABNORMAL HIGH (ref 8–27)
Creatinine, Ser: 3.16 mg/dL — ABNORMAL HIGH (ref 0.57–1.00)
eGFR: 14 mL/min/{1.73_m2} — ABNORMAL LOW (ref >59)

## 2023-09-08 ENCOUNTER — Other Ambulatory Visit: Payer: Self-pay | Admitting: Internal Medicine

## 2023-09-10 ENCOUNTER — Other Ambulatory Visit (HOSPITAL_COMMUNITY): Payer: Self-pay

## 2023-09-10 ENCOUNTER — Other Ambulatory Visit: Payer: Self-pay

## 2023-09-10 DIAGNOSIS — D631 Anemia in chronic kidney disease: Secondary | ICD-10-CM | POA: Diagnosis not present

## 2023-09-10 DIAGNOSIS — N2581 Secondary hyperparathyroidism of renal origin: Secondary | ICD-10-CM | POA: Diagnosis not present

## 2023-09-10 DIAGNOSIS — E1122 Type 2 diabetes mellitus with diabetic chronic kidney disease: Secondary | ICD-10-CM | POA: Diagnosis not present

## 2023-09-10 DIAGNOSIS — I129 Hypertensive chronic kidney disease with stage 1 through stage 4 chronic kidney disease, or unspecified chronic kidney disease: Secondary | ICD-10-CM | POA: Diagnosis not present

## 2023-09-10 DIAGNOSIS — N184 Chronic kidney disease, stage 4 (severe): Secondary | ICD-10-CM | POA: Diagnosis not present

## 2023-09-11 LAB — LAB REPORT - SCANNED: EGFR: 17

## 2023-09-22 ENCOUNTER — Encounter: Payer: Self-pay | Admitting: Internal Medicine

## 2023-09-22 ENCOUNTER — Encounter: Payer: PPO | Admitting: Internal Medicine

## 2023-09-22 NOTE — Patient Instructions (Addendum)
Flu immunization administered today.     Blood work was ordered.   The lab is on the first floor.    Medications changes include :   none     Return in about 6 months (around 03/22/2024) for follow up.    Health Maintenance, Female Adopting a healthy lifestyle and getting preventive care are important in promoting health and wellness. Ask your health care provider about: The right schedule for you to have regular tests and exams. Things you can do on your own to prevent diseases and keep yourself healthy. What should I know about diet, weight, and exercise? Eat a healthy diet  Eat a diet that includes plenty of vegetables, fruits, low-fat dairy products, and lean protein. Do not eat a lot of foods that are high in solid fats, added sugars, or sodium. Maintain a healthy weight Body mass index (BMI) is used to identify weight problems. It estimates body fat based on height and weight. Your health care provider can help determine your BMI and help you achieve or maintain a healthy weight. Get regular exercise Get regular exercise. This is one of the most important things you can do for your health. Most adults should: Exercise for at least 150 minutes each week. The exercise should increase your heart rate and make you sweat (moderate-intensity exercise). Do strengthening exercises at least twice a week. This is in addition to the moderate-intensity exercise. Spend less time sitting. Even light physical activity can be beneficial. Watch cholesterol and blood lipids Have your blood tested for lipids and cholesterol at 80 years of age, then have this test every 5 years. Have your cholesterol levels checked more often if: Your lipid or cholesterol levels are high. You are older than 80 years of age. You are at high risk for heart disease. What should I know about cancer screening? Depending on your health history and family history, you may need to have cancer screening at  various ages. This may include screening for: Breast cancer. Cervical cancer. Colorectal cancer. Skin cancer. Lung cancer. What should I know about heart disease, diabetes, and high blood pressure? Blood pressure and heart disease High blood pressure causes heart disease and increases the risk of stroke. This is more likely to develop in people who have high blood pressure readings or are overweight. Have your blood pressure checked: Every 3-5 years if you are 10-12 years of age. Every year if you are 8 years old or older. Diabetes Have regular diabetes screenings. This checks your fasting blood sugar level. Have the screening done: Once every three years after age 32 if you are at a normal weight and have a low risk for diabetes. More often and at a younger age if you are overweight or have a high risk for diabetes. What should I know about preventing infection? Hepatitis B If you have a higher risk for hepatitis B, you should be screened for this virus. Talk with your health care provider to find out if you are at risk for hepatitis B infection. Hepatitis C Testing is recommended for: Everyone born from [birth year] through 1965. Anyone with known risk factors for hepatitis C. Sexually transmitted infections (STIs) Get screened for STIs, including gonorrhea and chlamydia, if: You are sexually active and are younger than 80 years of age. You are older than 80 years of age and your health care provider tells you that you are at risk for this type of infection. Your sexual activity has changed since you  were last screened, and you are at increased risk for chlamydia or gonorrhea. Ask your health care provider if you are at risk. Ask your health care provider about whether you are at high risk for HIV. Your health care provider may recommend a prescription medicine to help prevent HIV infection. If you choose to take medicine to prevent HIV, you should first get tested for HIV. You should then be  tested every 3 months for as long as you are taking the medicine. Pregnancy If you are about to stop having your period (premenopausal) and you may become pregnant, seek counseling before you get pregnant. Take 400 to 800 micrograms (mcg) of folic acid every day if you become pregnant. Ask for birth control (contraception) if you want to prevent pregnancy. Osteoporosis and menopause Osteoporosis is a disease in which the bones lose minerals and strength with aging. This can result in bone fractures. If you are 86 years old or older, or if you are at risk for osteoporosis and fractures, ask your health care provider if you should: Be screened for bone loss. Take a calcium or vitamin D supplement to lower your risk of fractures. Be given hormone replacement therapy (HRT) to treat symptoms of menopause. Follow these instructions at home: Alcohol use Do not drink alcohol if: Your health care provider tells you not to drink. You are pregnant, may be pregnant, or are planning to become pregnant. If you drink alcohol: Limit how much you have to: 0-1 drink a day. Know how much alcohol is in your drink. In the [place] one drink equals one 12 oz bottle of beer (355 mL), one 5 oz glass of wine (148 mL), or one 1 oz glass of hard liquor (44 mL). Lifestyle Do not use any products that contain nicotine or tobacco. These products include cigarettes, chewing tobacco, and vaping devices, such as e-cigarettes. If you need help quitting, ask your health care provider. Do not use street drugs. Do not share needles. Ask your health care provider for help if you need support or information about quitting drugs. General instructions Schedule regular health, dental, and eye exams. Stay current with your vaccines. Tell your health care provider if: You often feel depressed. You have ever been abused or do not feel safe at home. Summary Adopting a healthy lifestyle and getting preventive care are important in  promoting health and wellness. Follow your health care provider's instructions about healthy diet, exercising, and getting tested or screened for diseases. Follow your health care provider's instructions on monitoring your cholesterol and blood pressure. This information is not intended to replace advice given to you by your health care provider. Make sure you discuss any questions you have with your health care provider. Document Revised: 03/18/2021 Document Reviewed: 03/18/2021 Elsevier Patient Education  2024 [employer].

## 2023-09-22 NOTE — Progress Notes (Unsigned)
Subjective:    Patient ID: Sheila Melton, female    DOB: Aug 08, 1941, 82 y.o.   MRN: [id number]      HPI Sheila Melton is here for a Physical exam and her chronic medical problems.   Sugar today 133 - has been good.   Sleeps more than she should - her pain medication makes her drowsy.  She is limited by her back pain.  Today she has a back brace and 5 pain patches.     Medications and allergies reviewed with patient and updated if appropriate.  Current Outpatient Medications on File Prior to Visit  Medication Sig Dispense Refill   ascorbic Acid (VITAMIN C) 500 MG CPCR 1 cap PO QD     B Complex-C (SUPER B COMPLEX PO) Take 1 tablet by mouth daily.      Blood Glucose Monitoring Suppl (ONE TOUCH ULTRA 2) w/Device KIT Use as advised 1 each 0   calcium carbonate (TUMS - DOSED IN MG ELEMENTAL CALCIUM) 500 MG chewable tablet Chew 1 tablet by mouth daily as needed for indigestion or heartburn.     Cholecalciferol (VITAMIN D3) 125 MCG (5000 UT) TABS Take 5,000 Units by mouth daily.     diazepam (VALIUM) 5 MG tablet TAKE ONE TABLET BY MOUTH AT BEDTIME AS NEEDED. 30 tablet 5   Dulaglutide (TRULICITY) 1.5 MG/0.5ML SOAJ Inject 1.5 mg into the skin once a week. 2 mL 3   fexofenadine (ALLEGRA) 180 MG tablet Take 180 mg by mouth at bedtime.     FLUoxetine (PROZAC) 10 MG capsule TAKE ONE CAPSULE BY MOUTH DAILY 90 capsule 2   furosemide (LASIX) 40 MG tablet TAKE 1/2 TO 1 TABLET BY MOUTH DAILY AS NEEDED FOR FLUID 90 tablet 0   glipiZIDE (GLUCOTROL) 5 MG tablet TAKE ONE TABLET BY MOUTH EVERY MORNING and TAKE TWO TABLETS EVERY EVENING 270 tablet 0   glucose blood (ONETOUCH ULTRA) test strip CHECK BLOOD SUGAR TWICE DAILY 100 strip 3   KERENDIA 10 MG TABS Take 1 tablet by mouth daily.     labetalol (NORMODYNE) 300 MG tablet Take 1 tablet (300 mg total) by mouth 2 (two) times daily.     Liniments (SALONPAS PAIN RELIEF PATCH EX) Apply 1 patch topically daily as needed (back pain).     losartan (COZAAR) 100 MG  tablet Take 1 tablet (100 mg total) by mouth daily. 90 tablet 2   OVER THE COUNTER MEDICATION CBD Gummies 2 each daily     oxyCODONE-acetaminophen (PERCOCET/ROXICET) 5-325 MG tablet Take 1 tablet by mouth 3 (three) times daily as needed.     rosuvastatin (CRESTOR) 20 MG tablet TAKE ONE TABLET BY MOUTH DAILY 90 tablet 0   Triamcinolone Acetonide (NASACORT ALLERGY 24HR NA) Place 1 spray into the nose 2 (two) times daily as needed (congestion).     vitamin B-12 (CYANOCOBALAMIN) 1000 MCG tablet Take 1,000 mcg by mouth daily.     vitamin E 180 MG (400 UNITS) capsule Take 400 Units by mouth daily.     No current facility-administered medications on file prior to visit.    Review of Systems  Constitutional:  Negative for fever.  Eyes:  Negative for visual disturbance.  Respiratory:  Negative for cough, shortness of breath and wheezing.   Cardiovascular:  Negative for chest pain, palpitations and leg swelling.  Gastrointestinal:  Positive for constipation. Negative for abdominal pain, blood in stool and diarrhea.       Occ gerd  Genitourinary:  Negative for dysuria.  Musculoskeletal:  Positive for arthralgias (knee pain) and back pain.  Skin:  Negative for rash.  Neurological:  Negative for light-headedness and headaches.  Psychiatric/Behavioral:  Positive for dysphoric mood (controlled) and sleep disturbance. The patient is nervous/anxious (controlled).        Objective:   Vitals:   09/23/23 0921  BP: 126/70  Pulse: 78  Temp: 98 F (36.7 C)  SpO2: 97%   Filed Weights   09/23/23 0921  Weight: 157 lb (71.2 kg)   Body mass index is 26.95 kg/m.  BP Readings from Last 3 Encounters:  09/23/23 126/70  06/22/23 130/80  03/17/23 130/80    Wt Readings from Last 3 Encounters:  09/23/23 157 lb (71.2 kg)  06/22/23 155 lb (70.3 kg)  03/17/23 155 lb 9.6 oz (70.6 kg)       Physical Exam Constitutional: She appears well-developed and well-nourished. No distress.  HENT:  Head:  Normocephalic and atraumatic.  Right Ear: External ear normal. Normal ear canal and TM Left Ear: External ear normal.  Normal ear canal and TM Mouth/Throat: Oropharynx is clear and moist.  Eyes: Conjunctivae normal.  Neck: Neck supple. No tracheal deviation present. No thyromegaly present.  No carotid bruit  Cardiovascular: Normal rate, regular rhythm and normal heart sounds.   No murmur heard.  No edema. Pulmonary/Chest: Effort normal and breath sounds normal. No respiratory distress. She has no wheezes. She has no rales.  Breast: deferred   Abdominal: Soft. She exhibits no distension. There is no tenderness.  Lymphadenopathy: She has no cervical adenopathy.  Skin: Skin is warm and dry. She is not diaphoretic.  Psychiatric: She has a normal mood and affect. Her behavior is normal.     Lab Results  Component Value Date   WBC 11.7 (H) 03/19/2023   HGB 10.8 (L) 03/19/2023   HCT 32.5 (L) 03/19/2023   PLT 293.0 03/19/2023   GLUCOSE 170 (H) 03/19/2023   CHOL 127 03/19/2023   TRIG 260.0 (H) 03/19/2023   HDL 37.40 (L) 03/19/2023   LDLDIRECT 47.0 03/19/2023   LDLCALC 70 05/21/2020   ALT 11 03/19/2023   AST 15 03/19/2023   NA 139 03/19/2023   K 4.1 03/19/2023   CL 104 03/19/2023   CREATININE 3.16 (H) 09/02/2023   BUN 47 (H) 09/02/2023   CO2 24 03/19/2023   TSH 3.01 11/21/2020   INR 0.95 02/12/2017   HGBA1C 6.9 (H) 03/19/2023   MICROALBUR 130.8 (H) 03/19/2023         Assessment & Plan:   Physical exam: Screening blood work  ordered Exercise  none Weight  overweight Substance abuse  e-cigarettes  - no desire to quit   Reviewed recommended immunizations.   Health Maintenance  Topic Date Due   Lung Cancer Screening  Never done   FOOT EXAM  05/21/2022   OPHTHALMOLOGY EXAM  01/10/2023   HEMOGLOBIN A1C  09/19/2023   COVID-19 Vaccine (5 - 2023-24 season) 10/09/2023 (Originally 07/12/2023)   Zoster Vaccines- Shingrix (1 of 2) 12/24/2023 (Originally 12/29/1959)   DEXA  SCAN  03/16/2024 (Originally 12/06/2022)   DTaP/Tdap/Td (1 - Tdap) 09/22/2024 (Originally [birth year]29/1959)   Diabetic kidney evaluation - Urine ACR  03/18/2024   Medicare Annual Wellness (AWV)  06/21/2024   Diabetic kidney evaluation - eGFR measurement  09/10/2024   Pneumonia Vaccine 66+ Years old  Completed   INFLUENZA VACCINE  Completed   HPV VACCINES  Aged Out      Scheduled for eye exam - Dr Shea Evans in a couple  of weeks    See Problem List for Assessment and Plan of chronic medical problems.

## 2023-09-23 ENCOUNTER — Ambulatory Visit: Payer: PPO | Admitting: Internal Medicine

## 2023-09-23 VITALS — BP 126/70 | HR 78 | Temp 98.0°F | Ht 64.0 in | Wt 157.0 lb

## 2023-09-23 DIAGNOSIS — Z0000 Encounter for general adult medical examination without abnormal findings: Secondary | ICD-10-CM | POA: Diagnosis not present

## 2023-09-23 DIAGNOSIS — I10 Essential (primary) hypertension: Secondary | ICD-10-CM

## 2023-09-23 DIAGNOSIS — I7143 Infrarenal abdominal aortic aneurysm, without rupture: Secondary | ICD-10-CM | POA: Diagnosis not present

## 2023-09-23 DIAGNOSIS — G479 Sleep disorder, unspecified: Secondary | ICD-10-CM

## 2023-09-23 DIAGNOSIS — I700 Atherosclerosis of aorta: Secondary | ICD-10-CM

## 2023-09-23 DIAGNOSIS — Z7985 Long-term (current) use of injectable non-insulin antidiabetic drugs: Secondary | ICD-10-CM | POA: Diagnosis not present

## 2023-09-23 DIAGNOSIS — M4807 Spinal stenosis, lumbosacral region: Secondary | ICD-10-CM

## 2023-09-23 DIAGNOSIS — M810 Age-related osteoporosis without current pathological fracture: Secondary | ICD-10-CM | POA: Diagnosis not present

## 2023-09-23 DIAGNOSIS — N184 Chronic kidney disease, stage 4 (severe): Secondary | ICD-10-CM

## 2023-09-23 DIAGNOSIS — E782 Mixed hyperlipidemia: Secondary | ICD-10-CM

## 2023-09-23 DIAGNOSIS — F411 Generalized anxiety disorder: Secondary | ICD-10-CM

## 2023-09-23 DIAGNOSIS — E1159 Type 2 diabetes mellitus with other circulatory complications: Secondary | ICD-10-CM | POA: Diagnosis not present

## 2023-09-23 DIAGNOSIS — F3289 Other specified depressive episodes: Secondary | ICD-10-CM

## 2023-09-23 LAB — COMPREHENSIVE METABOLIC PANEL
ALT: 17 U/L (ref 0–35)
AST: 20 U/L (ref 0–37)
Albumin: 4.2 g/dL (ref 3.5–5.2)
Alkaline Phosphatase: 74 U/L (ref 39–117)
BUN: 35 mg/dL — ABNORMAL HIGH (ref 6–23)
CO2: 25 meq/L (ref 19–32)
Calcium: 9.4 mg/dL (ref 8.4–10.5)
Chloride: 103 meq/L (ref 96–112)
Creatinine, Ser: 2.82 mg/dL — ABNORMAL HIGH (ref 0.40–1.20)
GFR: 15.31 mL/min — ABNORMAL LOW (ref >60.00)
Glucose, Bld: 118 mg/dL — ABNORMAL HIGH (ref 70–99)
Potassium: 3.9 meq/L (ref 3.5–5.1)
Sodium: 137 meq/L (ref 135–145)
Total Bilirubin: 0.4 mg/dL (ref 0.2–1.2)
Total Protein: 7 g/dL (ref 6.0–8.3)

## 2023-09-23 LAB — CBC WITH DIFFERENTIAL/PLATELET
Basophils Absolute: 0.1 10*3/uL (ref 0.0–0.1)
Basophils Relative: 0.9 % (ref 0.0–3.0)
Eosinophils Absolute: 0.6 10*3/uL (ref 0.0–0.7)
Eosinophils Relative: 7.1 % — ABNORMAL HIGH (ref 0.0–5.0)
HCT: 32.9 % — ABNORMAL LOW (ref 36.0–46.0)
Hemoglobin: 10.9 g/dL — ABNORMAL LOW (ref 12.0–15.0)
Lymphocytes Relative: 26.5 % (ref 12.0–46.0)
Lymphs Abs: 2.4 10*3/uL (ref 0.7–4.0)
MCHC: 33.2 g/dL (ref 30.0–36.0)
MCV: 90.8 fL (ref 78.0–100.0)
Monocytes Absolute: 0.9 10*3/uL (ref 0.1–1.0)
Monocytes Relative: 10.1 % (ref 3.0–12.0)
Neutro Abs: 5 10*3/uL (ref 1.4–7.7)
Neutrophils Relative %: 55.4 % (ref 43.0–77.0)
Platelets: 291 10*3/uL (ref 150.0–400.0)
RBC: 3.62 Mil/uL — ABNORMAL LOW (ref 3.87–5.11)
RDW: 13.4 % (ref 11.5–15.5)
WBC: 9.1 10*3/uL (ref 4.0–10.5)

## 2023-09-23 LAB — LIPID PANEL
Cholesterol: 154 mg/dL (ref 0–200)
HDL: 37.3 mg/dL — ABNORMAL LOW (ref >39.00)
LDL Cholesterol: 41 mg/dL (ref 0–99)
NonHDL: 117.16
Total CHOL/HDL Ratio: 4
Triglycerides: 383 mg/dL — ABNORMAL HIGH (ref 0.0–149.0)
VLDL: 76.6 mg/dL — ABNORMAL HIGH (ref 0.0–40.0)

## 2023-09-23 LAB — TSH: TSH: 3.19 u[IU]/mL (ref 0.35–5.50)

## 2023-09-23 LAB — HEMOGLOBIN A1C: Hgb A1c MFr Bld: 7.1 % — ABNORMAL HIGH (ref 4.6–6.5)

## 2023-09-23 NOTE — Assessment & Plan Note (Signed)
Chronic Controlled, Stable Continue fluoxetine 10 mg daily 

## 2023-09-23 NOTE — Assessment & Plan Note (Addendum)
Chronic Check CMP, CBC, vitamin D level Following with Dr. Allena Katz On [name] Sugars well controlled Blood pressure well controlled

## 2023-09-23 NOTE — Assessment & Plan Note (Signed)
Chronic Blood pressure well controlled cmp Continue labetalol 300 mg twice daily, losartan 100 mg daily

## 2023-09-23 NOTE — Assessment & Plan Note (Signed)
Chronic Lipid panel, CMP, TSH Continue Crestor 20 mg daily

## 2023-09-23 NOTE — Assessment & Plan Note (Signed)
Chronic Following with vascular surgery 

## 2023-09-23 NOTE — Assessment & Plan Note (Signed)
Chronic Unable to have surgery Taking oxycodone 5-325 mg 1 tab 3 times daily as needed

## 2023-09-23 NOTE — Assessment & Plan Note (Addendum)
Chronic DEXA up-to-date vitamin D level - just checked by nephrology 63

## 2023-09-23 NOTE — Assessment & Plan Note (Signed)
Chronic Check lipid Continue crestor 20 mg daily Unable to exercise due to chronic back pain Encouraged healthy diet

## 2023-09-23 NOTE — Assessment & Plan Note (Signed)
Chronic  Lab Results  Component Value Date   HGBA1C 6.9 (H) 03/19/2023   Sugars  controlled Check A1c Continue Trulicity 1.5 mg weekly, glipizide 5 mg q am, 10 mg q pm Stressed diabetic diet

## 2023-09-23 NOTE — Assessment & Plan Note (Signed)
Chronic °Controlled, Stable °Continue diazepam 5 mg nightly °

## 2023-10-02 DIAGNOSIS — M2555 Pain in hip: Secondary | ICD-10-CM | POA: Diagnosis not present

## 2023-10-02 DIAGNOSIS — M542 Cervicalgia: Secondary | ICD-10-CM | POA: Diagnosis not present

## 2023-10-02 DIAGNOSIS — M5451 Vertebrogenic low back pain: Secondary | ICD-10-CM | POA: Diagnosis not present

## 2023-10-06 ENCOUNTER — Other Ambulatory Visit: Payer: Self-pay | Admitting: Internal Medicine

## 2023-10-06 ENCOUNTER — Other Ambulatory Visit (HOSPITAL_BASED_OUTPATIENT_CLINIC_OR_DEPARTMENT_OTHER): Payer: Self-pay

## 2023-10-06 ENCOUNTER — Other Ambulatory Visit: Payer: Self-pay

## 2023-10-06 DIAGNOSIS — H40013 Open angle with borderline findings, low risk, bilateral: Secondary | ICD-10-CM | POA: Diagnosis not present

## 2023-10-06 MED ORDER — TRULICITY 1.5 MG/0.5ML ~~LOC~~ SOAJ
1.5000 mg | SUBCUTANEOUS | 3 refills | Status: DC
  Filled 2023-10-06 – 2023-11-12 (×4): qty 2, 28d supply, fill #0
  Filled 2023-12-03: qty 2, 28d supply, fill #1
  Filled 2024-01-01: qty 2, 28d supply, fill #2

## 2023-10-12 ENCOUNTER — Other Ambulatory Visit (HOSPITAL_COMMUNITY): Payer: Self-pay

## 2023-10-12 ENCOUNTER — Other Ambulatory Visit (HOSPITAL_BASED_OUTPATIENT_CLINIC_OR_DEPARTMENT_OTHER): Payer: Self-pay

## 2023-10-13 ENCOUNTER — Other Ambulatory Visit: Payer: Self-pay

## 2023-10-26 ENCOUNTER — Other Ambulatory Visit: Payer: Self-pay

## 2023-10-28 ENCOUNTER — Other Ambulatory Visit (HOSPITAL_COMMUNITY): Payer: Self-pay

## 2023-10-28 ENCOUNTER — Other Ambulatory Visit: Payer: Self-pay

## 2023-10-29 ENCOUNTER — Other Ambulatory Visit (HOSPITAL_COMMUNITY): Payer: Self-pay

## 2023-10-30 ENCOUNTER — Other Ambulatory Visit: Payer: Self-pay

## 2023-11-09 ENCOUNTER — Other Ambulatory Visit (HOSPITAL_COMMUNITY): Payer: Self-pay

## 2023-11-09 DIAGNOSIS — Z9689 Presence of other specified functional implants: Secondary | ICD-10-CM | POA: Diagnosis not present

## 2023-11-09 DIAGNOSIS — F1120 Opioid dependence, uncomplicated: Secondary | ICD-10-CM | POA: Diagnosis not present

## 2023-11-09 DIAGNOSIS — M961 Postlaminectomy syndrome, not elsewhere classified: Secondary | ICD-10-CM | POA: Diagnosis not present

## 2023-11-12 ENCOUNTER — Other Ambulatory Visit (HOSPITAL_COMMUNITY): Payer: Self-pay

## 2023-11-26 ENCOUNTER — Telehealth: Payer: Self-pay

## 2023-11-26 ENCOUNTER — Other Ambulatory Visit (HOSPITAL_COMMUNITY): Payer: Self-pay

## 2023-11-26 ENCOUNTER — Telehealth: Payer: Self-pay | Admitting: Pharmacist

## 2023-11-26 NOTE — Telephone Encounter (Signed)
Spoke with Sonu today from pharmacy and she said 1.5 mg info had been received and currently being reviewed for processing.

## 2023-11-26 NOTE — Telephone Encounter (Signed)
Pharmacy Patient Advocate Encounter  Received notification from [facility] [employer] that Prior Authorization for Trulicity 1.5mg /0.60ml  has been DENIED.  Full denial letter will be uploaded to the media tab. See denial reason below.   PA #/Case ID/Reference #: --

## 2023-11-26 NOTE — Telephone Encounter (Signed)
Pharmacy Patient Advocate Encounter   Received notification from Fax that prior authorization for Trulicity 1.5mg /0.39ml is required/requested.   Insurance verification completed.   The patient is insured through [facility] [employer]VANT[employer]/RX ADVANCE .   Per test claim: PA required; PA submitted to above mentioned insurance via Fax Key/confirmation #/EOC -- Status is pending   Faxed to [phone] Phone # [phone] (option 2)

## 2023-11-26 NOTE — Telephone Encounter (Signed)
Appeal has been submitted for Trulicity.  Will advise when response is received , please be advised that most companies may take 30 days to make a decision. The appeal letter and supporting documentation have been faxed to [phone] on 11/26/2023 @1 :05 pm.  Thank you, Dellie Burns, PharmD Clinical Pharmacist    Direct Dial: [phone]

## 2023-12-08 ENCOUNTER — Other Ambulatory Visit: Payer: Self-pay | Admitting: Internal Medicine

## 2023-12-10 ENCOUNTER — Telehealth: Payer: Self-pay

## 2023-12-10 NOTE — Telephone Encounter (Signed)
Copied from CRM [id number]. Topic: General - Other >> Dec 09, 2023  4:09 PM Irine Seal wrote: Reason for CRM:  Frazier Butt with [employer] checking on the status of a chronic condition verification form that was faxed over multiple times this month, confirmed fax #  was correct please call glenna back at  [phone], she states she has resent it a few times and have not heard back

## 2023-12-10 NOTE — Telephone Encounter (Signed)
Spoke with Corrie Dandy and info given.

## 2023-12-15 ENCOUNTER — Other Ambulatory Visit (HOSPITAL_COMMUNITY): Payer: Self-pay

## 2023-12-15 NOTE — Telephone Encounter (Signed)
[employer] approved the appeal for Trulicity on 11/27/2023.

## 2023-12-17 ENCOUNTER — Other Ambulatory Visit: Payer: Self-pay

## 2023-12-21 ENCOUNTER — Other Ambulatory Visit (HOSPITAL_COMMUNITY): Payer: Self-pay

## 2023-12-29 ENCOUNTER — Encounter: Payer: Self-pay | Admitting: Cardiovascular Disease

## 2023-12-29 ENCOUNTER — Ambulatory Visit: Payer: PPO | Attending: Cardiovascular Disease | Admitting: Cardiovascular Disease

## 2023-12-29 VITALS — BP 96/60 | HR 93 | Ht 64.0 in | Wt 157.0 lb

## 2023-12-29 DIAGNOSIS — I714 Abdominal aortic aneurysm, without rupture, unspecified: Secondary | ICD-10-CM

## 2023-12-29 DIAGNOSIS — I10 Essential (primary) hypertension: Secondary | ICD-10-CM

## 2023-12-29 DIAGNOSIS — Z720 Tobacco use: Secondary | ICD-10-CM

## 2023-12-29 DIAGNOSIS — E785 Hyperlipidemia, unspecified: Secondary | ICD-10-CM

## 2023-12-29 DIAGNOSIS — I771 Stricture of artery: Secondary | ICD-10-CM

## 2023-12-29 DIAGNOSIS — N184 Chronic kidney disease, stage 4 (severe): Secondary | ICD-10-CM | POA: Diagnosis not present

## 2023-12-29 NOTE — Patient Instructions (Signed)
 Medication Instructions:  No changes *If you need a refill on your cardiac medications before your next appointment, please call your pharmacy*   Lab Work: None ordered If you have labs (blood work) drawn today and your tests are completely normal, you will receive your results only by: MyChart Message (if you have MyChart) OR A paper copy in the mail If you have any lab test that is abnormal or we need to change your treatment, we will call you to review the results.   Testing/Procedures: None ordered   Follow-Up: At [facility], you and your health needs are our priority.  As part of our continuing mission to provide you with exceptional heart care, we have created designated Provider Care Teams.  These Care Teams include your primary Cardiologist (physician) and Advanced Practice Providers (APPs -  Physician Assistants and Nurse Practitioners) who all work together to provide you with the care you need, when you need it.  We recommend signing up for the patient portal called "MyChart".  Sign up information is provided on this After Visit Summary.  MyChart is used to connect with patients for Virtual Visits (Telemedicine).  Patients are able to view lab/test results, encounter notes, upcoming appointments, etc.  Non-urgent messages can be sent to your provider as well.   To learn more about what you can do with MyChart, go to [email or web address].    Your next appointment:   6 month(s)  Provider:   Lorine Bears, MD

## 2023-12-29 NOTE — Progress Notes (Signed)
Cardiology Office Note   Date:  12/29/2023   ID:  Sheila Melton, DOB 1941/04/28, MRN [id number]  PCP:  Pincus Sanes, MD  Cardiologist: Dr. Kirke Corin  No chief complaint on file.    History of Present Illness: Sheila Melton is a 83 y.o. female who is here today for follow-up visit regarding left subclavian artery stenosis.   The patient has known history of abdominal aortic aneurysm followed by vascular surgery, hyperlipidemia, chronic kidney disease , tobacco use and diabetes mellitus.   Echocardiogram July 2009 showed normal left ventricular function and mild mitral regurgitation.  She has known history of asymptomatic left subclavian artery stenosis for many years that was left to be treated medically due to lack of symptoms.   She has known large abdominal aortic aneurysm followed by vascular surgery with difficult management options.   She had an echocardiogram done in December 2022 which showed normal LV systolic function with no significant valvular abnormalities and small pericardial effusion. Carotid Doppler in December 2022 showed mild nonobstructive disease with known left subclavian stenosis.  She continues to deny left arm claudication.  She reports having indigestion and burning sensation in the chest about 1 week ago that responded to antacids.  No recurrent symptoms since then.  The symptoms were not exertional.   Past Medical History:  Diagnosis Date   AAA (abdominal aortic aneurysm) (HCC)    BEING WATCHED   VVS. 4.2 cm infrarenal abdominal aortic aneurysm without rupture noted 11/08/2016   Arthritis    Cancer (HCC)    skin cancer removed from left shoulder  2017   CAP (community acquired pneumonia) 09/02/13-11/05/13    with SIRS   Depression    ?? from prednisone    not on pred now   Diabetes mellitus    type ii    long time ago.   Family history of anesthesia complication    PATIENTS MOM HAD TROUBLE WAKING UP    GERD (gastroesophageal reflux disease)     will take occasional tums   Heart murmur    still has.   Sees Dr. Jens Som   History of kidney stones    has them at the present time   Hyperlipidemia    Hypertension    unspecified essential   Renal insufficiency    "FSGS"   Subclavian steal syndrome    LEFT SIDE   NO SIDE EFFECTS   Subclavian steal syndrome --  left    85% blocked   UTI (lower urinary tract infection) 08/26/13   Enterococcus and Escherichia coli both sensitive to nitrofurantoin    Past Surgical History:  Procedure Laterality Date   ABDOMINAL HYSTERECTOMY     BACK SURGERY     CATARACT EXTRACTION     OS   CYST EXCISION Right 05-23-13   Right index finger: cyst   DILATION AND CURETTAGE OF UTERUS     EYE SURGERY     g1 p1     SPINE SURGERY  Sept. 2013   VIDEO ASSISTED THORACOSCOPY (VATS)/ LOBECTOMY Right 02/16/2017   Procedure: Right VIDEO ASSISTED THORACOSCOPY (VATS)/ Right Middle LOBECTOMY;  Surgeon: Loreli Slot, MD;  Location: [facility] OR;  Service: Thoracic;  Laterality: Right;     Current Outpatient Medications  Medication Sig Dispense Refill   ascorbic Acid (VITAMIN C) 500 MG CPCR 1 cap PO QD     B Complex-C (SUPER B COMPLEX PO) Take 1 tablet by mouth daily.      Blood  Glucose Monitoring Suppl (ONE TOUCH ULTRA 2) w/Device KIT Use as advised 1 each 0   calcium carbonate (TUMS - DOSED IN MG ELEMENTAL CALCIUM) 500 MG chewable tablet Chew 1 tablet by mouth daily as needed for indigestion or heartburn.     Cholecalciferol (VITAMIN D3) 125 MCG (5000 UT) TABS Take 5,000 Units by mouth daily.     diazepam (VALIUM) 5 MG tablet TAKE ONE TABLET BY MOUTH AT BEDTIME AS NEEDED. 30 tablet 5   Dulaglutide (TRULICITY) 1.5 MG/0.5ML SOAJ Inject 1.5 mg into the skin once a week. 2 mL 3   fexofenadine (ALLEGRA) 180 MG tablet Take 180 mg by mouth at bedtime.     FLUoxetine (PROZAC) 10 MG capsule TAKE ONE CAPSULE BY MOUTH DAILY 90 capsule 2   furosemide (LASIX) 40 MG tablet TAKE 1/2 TO 1 TABLET BY MOUTH DAILY AS NEEDED FOR  FLUID 90 tablet 0   glipiZIDE (GLUCOTROL) 5 MG tablet TAKE ONE TABLET BY MOUTH EVERY MORNING and TAKE TWO TABLETS EVERY EVENING 270 tablet 0   glucose blood (ONETOUCH ULTRA) test strip CHECK BLOOD SUGAR TWICE DAILY 100 strip 3   KERENDIA 10 MG TABS Take 1 tablet by mouth daily.     labetalol (NORMODYNE) 300 MG tablet Take 1 tablet (300 mg total) by mouth 2 (two) times daily.     Liniments (SALONPAS PAIN RELIEF PATCH EX) Apply 1 patch topically daily as needed (back pain).     losartan (COZAAR) 100 MG tablet Take 1 tablet (100 mg total) by mouth daily. 90 tablet 2   OVER THE COUNTER MEDICATION CBD Gummies 2 each daily     oxyCODONE-acetaminophen (PERCOCET/ROXICET) 5-325 MG tablet Take 1 tablet by mouth 3 (three) times daily as needed.     rosuvastatin (CRESTOR) 20 MG tablet TAKE ONE TABLET BY MOUTH DAILY 90 tablet 0   Triamcinolone Acetonide (NASACORT ALLERGY 24HR NA) Place 1 spray into the nose 2 (two) times daily as needed (congestion).     vitamin B-12 (CYANOCOBALAMIN) 1000 MCG tablet Take 1,000 mcg by mouth daily.     vitamin E 180 MG (400 UNITS) capsule Take 400 Units by mouth daily.     No current facility-administered medications for this visit.    Allergies:   Rocephin [ceftriaxone sodium in dextrose], Amoxicillin, Captopril, Simvastatin, Ciprofloxacin, Semaglutide, Adhesive [tape], and Latex    Social History:  The patient  reports that she has been smoking e-cigarettes and cigarettes. She has a 50 pack-year smoking history. She has never used smokeless tobacco. She reports that she does not drink alcohol and does not use drugs.   Family History:  The patient's family history includes Coronary artery disease in an other family member; Deep vein thrombosis in her mother; Diabetes in her father, sister, and another family member; Heart attack in her brother, father, and mother; Heart disease in her brother, father, and mother; Heart failure in her brother and mother; Hyperlipidemia in her  brother, father, and sister; Hypertension in her daughter, father, mother, and sister; Other in her mother and sister; Parkinsonism in her brother; Varicose Veins in her mother.    ROS:  Please see the history of present illness.   Otherwise, review of systems are positive for none.   All other systems are reviewed and negative.    PHYSICAL EXAM: VS:  BP 96/60 (BP Location: Left Arm, Patient Position: Sitting)   Pulse 93   Ht 5\' 4"  (1.626 m)   Wt 157 lb (71.2 kg)   SpO2 Marland Kitchen)  87%   BMI 26.95 kg/m  , BMI Body mass index is 26.95 kg/m. GEN: Well nourished, well developed, in no acute distress  HEENT: normal  Neck: no JVD, carotid bruits, or masses. She does have a bruit at the left upper chest area Cardiac: RRR; rubs, or gallops,no edema . There is 2/ 6 systolic ejection murmur in the aortic area Respiratory:  clear to auscultation bilaterally, normal work of breathing GI: soft, nontender, nondistended, + BS MS: no deformity or atrophy  Skin: warm and dry, no rash Neuro:  Strength and sensation are intact Psych: euthymic mood, full affect Vascular: Radial pulses normal on the right side and mildly diminished on the left side.  EKG:  EKG is ordered today.  Normal sinus rhythm Normal ECG When compared with ECG of 18-Feb-2017 00:13, No significant change was found    Recent Labs: 09/23/2023: ALT 17; BUN 35; Creatinine, Ser 2.82; Hemoglobin 10.9; Platelets 291.0; Potassium 3.9; Sodium 137; TSH 3.19    Lipid Panel    Component Value Date/Time   CHOL 154 09/23/2023 1028   TRIG 383.0 (H) 09/23/2023 1028   TRIG 263 (HH) 09/15/2006 1116   HDL 37.30 (L) 09/23/2023 1028   CHOLHDL 4 09/23/2023 1028   VLDL 76.6 (H) 09/23/2023 1028   LDLCALC 41 09/23/2023 1028   LDLCALC 70 05/21/2020 1437   LDLDIRECT 47.0 03/19/2023 1451      Wt Readings from Last 3 Encounters:  12/29/23 157 lb (71.2 kg)  09/23/23 157 lb (71.2 kg)  06/22/23 155 lb (70.3 kg)        ASSESSMENT AND  PLAN:  1.  Left subclavian artery stenosis: She is overall asymptomatic with no left arm claudication or symptoms of clinical steal syndrome.  Continue medical therapy. Her blood pressure should always be checked in the right arm which is more reflective of central aortic pressure.  2. Essential hypertension: Amlodipine was discontinued last year due to hypertension.  Blood pressure seems to be reasonably controlled.  3. Hyperlipidemia: Most recent lipid profile showed an LDL of 41.  Continue treatment with rosuvastatin.  4. Tobacco use: She is using electronic cigarettes and trying to quit smoking. I discussed with her the importance of complete cessation.  5.   large size abdominal aortic aneurysm: Followed by vascular surgery.  The patient is hesitant about getting this repaired given underlying chronic kidney disease and the high possibility of needing dialysis.  Also her anatomy seems to be difficult for endovascular intervention.   Disposition:   Follow-up in 6 months. Signed,  Lorine Bears, MD  12/29/2023 1:29 PM    [facility]

## 2024-01-06 DIAGNOSIS — E1122 Type 2 diabetes mellitus with diabetic chronic kidney disease: Secondary | ICD-10-CM | POA: Diagnosis not present

## 2024-01-06 DIAGNOSIS — D631 Anemia in chronic kidney disease: Secondary | ICD-10-CM | POA: Diagnosis not present

## 2024-01-06 DIAGNOSIS — N184 Chronic kidney disease, stage 4 (severe): Secondary | ICD-10-CM | POA: Diagnosis not present

## 2024-01-06 DIAGNOSIS — N189 Chronic kidney disease, unspecified: Secondary | ICD-10-CM | POA: Diagnosis not present

## 2024-01-06 DIAGNOSIS — I129 Hypertensive chronic kidney disease with stage 1 through stage 4 chronic kidney disease, or unspecified chronic kidney disease: Secondary | ICD-10-CM | POA: Diagnosis not present

## 2024-01-06 DIAGNOSIS — N2581 Secondary hyperparathyroidism of renal origin: Secondary | ICD-10-CM | POA: Diagnosis not present

## 2024-01-14 ENCOUNTER — Other Ambulatory Visit: Payer: Self-pay

## 2024-01-18 ENCOUNTER — Other Ambulatory Visit: Payer: Self-pay

## 2024-01-25 ENCOUNTER — Other Ambulatory Visit (HOSPITAL_COMMUNITY): Payer: Self-pay

## 2024-01-25 ENCOUNTER — Other Ambulatory Visit: Payer: Self-pay | Admitting: Internal Medicine

## 2024-01-25 MED ORDER — TRULICITY 1.5 MG/0.5ML ~~LOC~~ SOAJ
1.5000 mg | SUBCUTANEOUS | 3 refills | Status: DC
  Filled 2024-01-25: qty 2, 28d supply, fill #0
  Filled 2024-02-24: qty 2, 28d supply, fill #1

## 2024-01-27 ENCOUNTER — Other Ambulatory Visit: Payer: Self-pay | Admitting: Internal Medicine

## 2024-01-29 ENCOUNTER — Other Ambulatory Visit (HOSPITAL_COMMUNITY): Payer: Self-pay

## 2024-02-04 DIAGNOSIS — E119 Type 2 diabetes mellitus without complications: Secondary | ICD-10-CM | POA: Diagnosis not present

## 2024-02-04 LAB — HM DIABETES EYE EXAM

## 2024-02-05 ENCOUNTER — Other Ambulatory Visit: Payer: Self-pay

## 2024-02-09 DIAGNOSIS — Z9689 Presence of other specified functional implants: Secondary | ICD-10-CM | POA: Diagnosis not present

## 2024-02-09 DIAGNOSIS — F1120 Opioid dependence, uncomplicated: Secondary | ICD-10-CM | POA: Diagnosis not present

## 2024-02-09 DIAGNOSIS — M961 Postlaminectomy syndrome, not elsewhere classified: Secondary | ICD-10-CM | POA: Diagnosis not present

## 2024-02-15 DIAGNOSIS — M5416 Radiculopathy, lumbar region: Secondary | ICD-10-CM | POA: Diagnosis not present

## 2024-02-18 ENCOUNTER — Ambulatory Visit: Payer: Self-pay | Admitting: Internal Medicine

## 2024-02-18 ENCOUNTER — Ambulatory Visit: Payer: Self-pay

## 2024-02-18 DIAGNOSIS — R739 Hyperglycemia, unspecified: Secondary | ICD-10-CM | POA: Diagnosis not present

## 2024-02-18 NOTE — Telephone Encounter (Signed)
  Chief Complaint: why sent to urgent care Symptoms: elevated blood sugar Frequency: since steroid injection on  Pertinent Negatives: Patient denies all symptoms Disposition: [] ED /[] Urgent Care (no appt availability in office) / [] Appointment(In office/virtual)/ []  [place] [facility]/ [] Home Care/ [] Refused Recommended Disposition /[] [place] [facility]/ [x]  Follow-up with PCP Additional Notes:  Patient called earlier today and triaged by another nurse, she was advised to go to urgent care. Provider Selena Batten from [facility] urgent care calling now to inquire why she was sent to urgent care for elevated blood sugar, they do not have insulin at urgent care, inquiring if the doctor would like her initiated on insulin. She is asymptomatic with sugar in 100's at this time. Kim recommending she be evaluated in office with PCP. Selena Batten is not in the room with patient at this time and will have patient call back once she is finished at urgent care to schedule PCP follow up.   Copied from CRM [id number]. Topic: General - Other >> Feb 18, 2024  4:48 PM [name] wrote: Reason for CRM: Received call from [facility] urgent care per Selena Batten needs to know why she was sent there for elevated blood surgar 360. Reason for Disposition  [1] Follow-up call from patient regarding patient's clinical status AND [2] information NON-URGENT  Protocols used: PCP Call - No Triage-A-AH

## 2024-02-18 NOTE — Telephone Encounter (Signed)
 Chief Complaint: Elevated blood glucose since Monday  Pertinent Negatives: Patient denies fever, frequent urination, difficulty breathing, dizziness, weakness, vomitig  Disposition: [x] Urgent Care (no appt availability in office)   Additional Notes: Pt states she had an epidural steroid shot on Monday at the pain clinic for her back. Pt states 188 is the lowest her blood glucose has been. That was this morning. This RN notified [facility] and pt is going to urgent care now. This RN educated pt on new-worsening symptoms and when to call back/seek emergent care. Pt verbalized understanding and agrees to plan.    Copied from CRM [id number]. Topic: Clinical - Red Word Triage >> Feb 18, 2024  2:51 PM Florestine Avers wrote: Red Word that prompted transfer to Nurse Triage: Patient called in stating that she is having trouble with her sugar. She had a steroid injection on monday and they told her that her bs would go up and to be sure to check it and if any issues arises to call Dr. Lawerance Bach. Sugar went to 380 mg/dL Monday night, she had a Trulicity shot Monday and she also took her glipizide. She was not able to get it to come down, she took her husbands metformin medication and it went down the following day. Patient states that every time she eats something, her sugar spikes up. patient is highly concerned sugar is currently 385mg /dL no other symptoms present. Reason for Disposition  [1] Blood glucose > 300 mg/dL (04.5 mmol/L) AND [4] two or more times in a row  Answer Assessment - Initial Assessment Questions BLOOD GLUCOSE: "What is your blood glucose level?"      370 ONSET: "When did you check the blood glucose?"     Right now  Denies missing any diabetes medications Pt took x2 of her husband's metformin one night   OTHER SYMPTOMS: "Do you have any symptoms?" (e.g., fever, frequent urination, difficulty breathing, dizziness, weakness, vomiting)     Denies  Protocols used: Diabetes - High Blood Sugar-A-AH

## 2024-02-19 NOTE — Telephone Encounter (Signed)
 Call her Monday and see how her sugars have been.  Was this just a one-time elevation or have they been elevated for a while.  We can consider increasing Trulicity.  We can do insulin if needed.  Okay to move up the appointment if she wishes so we can discuss.

## 2024-02-22 NOTE — Telephone Encounter (Signed)
 Attempted to reach patient on home number but is it a non working number.  Cell phone is full and unable to leave message.  Sent my-chart message to her today.

## 2024-02-24 ENCOUNTER — Other Ambulatory Visit (HOSPITAL_COMMUNITY): Payer: Self-pay

## 2024-02-29 ENCOUNTER — Ambulatory Visit (INDEPENDENT_AMBULATORY_CARE_PROVIDER_SITE_OTHER)

## 2024-02-29 ENCOUNTER — Other Ambulatory Visit: Payer: Self-pay

## 2024-02-29 VITALS — Ht 64.0 in | Wt 152.0 lb

## 2024-02-29 DIAGNOSIS — E1159 Type 2 diabetes mellitus with other circulatory complications: Secondary | ICD-10-CM | POA: Diagnosis not present

## 2024-02-29 DIAGNOSIS — Z0000 Encounter for general adult medical examination without abnormal findings: Secondary | ICD-10-CM

## 2024-02-29 NOTE — Patient Instructions (Addendum)
 Sheila Melton , Thank you for taking time to come for your Medicare Wellness Visit. I appreciate your ongoing commitment to your health goals. Please review the following plan we discussed and let me know if I can assist you in the future.   Referrals/Orders/Follow-Ups/Clinician Recommendations: It was nice talking to you today.  You are due for a foot exam and a kidney evaluation, which can be done during your next office visit with PCP.  You are also due for a Shingles vaccine.  Each day, aim for 6 glasses of water, plenty of protein in your diet and try to get up and walk/ stretch every hour for 5-10 minutes at a time.    This is a list of the screening recommended for you and due dates:  Health Maintenance  Topic Date Due   Zoster (Shingles) Vaccine (1 of 2) Never done   Complete foot exam   05/21/2022   Eye exam for diabetics  01/10/2023   COVID-19 Vaccine (5 - 2024-25 season) 07/12/2023   Yearly kidney health urinalysis for diabetes  03/18/2024   DEXA scan (bone density measurement)  03/16/2024*   DTaP/Tdap/Td vaccine (1 - Tdap) 09/22/2024*   Hemoglobin A1C  03/22/2024   Flu Shot  06/10/2024   Medicare Annual Wellness Visit  06/21/2024   Yearly kidney function blood test for diabetes  09/22/2024   Pneumonia Vaccine  Completed   HPV Vaccine  Aged Out   Meningitis B Vaccine  Aged Out  *Topic was postponed. The date shown is not the original due date.    Advanced directives: (Copy Requested) Please bring a copy of your health care power of attorney and living will to the office to be added to your chart at your convenience. You can mail to [facility] [address] [place], [place] [address] or email to [email or web address]  Next Medicare Annual Wellness Visit scheduled for next year: Yes

## 2024-02-29 NOTE — Progress Notes (Signed)
 Subjective:   Sheila Melton is a 83 y.o. who presents for a Medicare Wellness preventive visit.  Visit Complete: Virtual I connected with  Clyda Dark on 02/29/24 by a audio enabled telemedicine application and verified that I am speaking with the correct person using two identifiers.  Patient Location: Home  Provider Location: Home Office  I discussed the limitations of evaluation and management by telemedicine. The patient expressed understanding and agreed to proceed.  Vital Signs: Because this visit was a virtual/telehealth visit, some criteria may be missing or patient reported. Any vitals not documented were not able to be obtained and vitals that have been documented are patient reported.  VideoDeclined- This patient declined Librarian, academic. Therefore the visit was completed with audio only.  Persons Participating in Visit: Patient.  AWV Questionnaire: No: Patient Medicare AWV questionnaire was not completed prior to this visit.  Cardiac Risk Factors include: advanced age (>44men, >17 women);hypertension;diabetes mellitus;dyslipidemia;Other (see comment), Risk factor comments: CKD, Aortic atherosclerosis     Objective:    Today's Vitals   02/29/24 1418  Weight: 152 lb (68.9 kg)  Height: 5\' 4"  (1.626 m)  PainSc: 8    Body mass index is 26.09 kg/m.     02/29/2024    2:34 PM 06/22/2023   11:29 AM 07/10/2021    3:00 PM 09/14/2020   10:47 AM 12/13/2019    8:52 AM 02/21/2019   10:01 AM 08/18/2017    9:32 AM  Advanced Directives  Does Patient Have a Medical Advance Directive? Yes Yes Yes Yes Yes Yes Yes  Type of Estate agent of [place];Living will Healthcare Power of [place];Living will Healthcare Power of [place];Living will Healthcare Power of [employer];Living will Healthcare Power of [place];Living will Healthcare Power of [place];Living will Healthcare Power of [place];Living will  Does patient want to make  changes to medical advance directive?  No - Patient declined  No - Patient declined No - Patient declined    Copy of Healthcare Power of Attorney in Chart? No - copy requested No - copy requested  No - copy requested       Current Medications (verified) Outpatient Encounter Medications as of 02/29/2024  Medication Sig   ascorbic Acid (VITAMIN C) 500 MG CPCR 1 cap PO QD   B Complex-C (SUPER B COMPLEX PO) Take 1 tablet by mouth daily.    Blood Glucose Monitoring Suppl (ONE TOUCH ULTRA 2) w/Device KIT Use as advised   calcium  carbonate (TUMS - DOSED IN MG ELEMENTAL CALCIUM ) 500 MG chewable tablet Chew 1 tablet by mouth daily as needed for indigestion or heartburn.   Cholecalciferol  (VITAMIN D3) 125 MCG (5000 UT) TABS Take 5,000 Units by mouth daily.   diazepam  (VALIUM ) 5 MG tablet TAKE ONE TABLET BY MOUTH AT BEDTIME AS NEEDED.   Dulaglutide  (TRULICITY ) 1.5 MG/0.5ML SOAJ Inject 1.5 mg into the skin once a week.   fexofenadine (ALLEGRA) 180 MG tablet Take 180 mg by mouth at bedtime.   FLUoxetine  (PROZAC ) 10 MG capsule TAKE ONE CAPSULE BY MOUTH DAILY   furosemide  (LASIX ) 40 MG tablet TAKE 1/2 TO 1 TABLET BY MOUTH DAILY AS NEEDED FOR FLUID   glipiZIDE  (GLUCOTROL ) 5 MG tablet TAKE ONE TABLET BY MOUTH EVERY MORNING and TAKE TWO TABLETS EVERY EVENING   glucose blood (ONETOUCH ULTRA) test strip CHECK BLOOD SUGAR TWICE DAILY   KERENDIA 10 MG TABS Take 1 tablet by mouth daily.   labetalol  (NORMODYNE ) 300 MG tablet Take 1 tablet (  300 mg total) by mouth 2 (two) times daily.   Liniments (SALONPAS  PAIN RELIEF PATCH EX) Apply 1 patch topically daily as needed (back pain).   losartan  (COZAAR ) 100 MG tablet Take 1 tablet (100 mg total) by mouth daily.   OVER THE COUNTER MEDICATION CBD Gummies 2 each daily   oxyCODONE -acetaminophen  (PERCOCET/ROXICET) 5-325 MG tablet Take 1 tablet by mouth 3 (three) times daily as needed.   rosuvastatin  (CRESTOR ) 20 MG tablet TAKE ONE TABLET BY MOUTH DAILY   Triamcinolone   Acetonide (NASACORT  ALLERGY 24HR NA) Place 1 spray into the nose 2 (two) times daily as needed (congestion).   vitamin B-12 (CYANOCOBALAMIN) 1000 MCG tablet Take 1,000 mcg by mouth daily.   vitamin E 180 MG (400 UNITS) capsule Take 400 Units by mouth daily.   No facility-administered encounter medications on file as of 02/29/2024.    Allergies (verified) Rocephin  [ceftriaxone  sodium in dextrose ], Amoxicillin, Captopril, Simvastatin , Ciprofloxacin , Semaglutide , Adhesive [tape], and Latex   History: Past Medical History:  Diagnosis Date   AAA (abdominal aortic aneurysm) (HCC)    BEING WATCHED   VVS. 4.2 cm infrarenal abdominal aortic aneurysm without rupture noted 11/08/2016   Arthritis    Cancer (HCC)    skin cancer removed from left shoulder  2017   CAP (community acquired pneumonia) 09/02/13-11/05/13    with SIRS   Depression    ?? from prednisone     not on pred now   Diabetes mellitus    type ii    long time ago.   Family history of anesthesia complication    PATIENTS MOM HAD TROUBLE WAKING UP    GERD (gastroesophageal reflux disease)    will take occasional tums   Heart murmur    still has.   Sees Dr. Audery Blazing   History of kidney stones    has them at the present time   Hyperlipidemia    Hypertension    unspecified essential   Renal insufficiency    "FSGS"   Subclavian steal syndrome    LEFT SIDE   NO SIDE EFFECTS   Subclavian steal syndrome --  left    85% blocked   UTI (lower urinary tract infection) 08/26/13   Enterococcus and Escherichia coli both sensitive to nitrofurantoin    Past Surgical History:  Procedure Laterality Date   ABDOMINAL HYSTERECTOMY     BACK SURGERY     CATARACT EXTRACTION     OS   CYST EXCISION Right 05-23-13   Right index finger: cyst   DILATION AND CURETTAGE OF UTERUS     EYE SURGERY     g1 p1     SPINE SURGERY  Sept. 2013   VIDEO ASSISTED THORACOSCOPY (VATS)/ LOBECTOMY Right 02/16/2017   Procedure: Right VIDEO ASSISTED THORACOSCOPY  (VATS)/ Right Middle LOBECTOMY;  Surgeon: Zelphia Higashi, MD;  Location: [facility] OR;  Service: Thoracic;  Laterality: Right;   Family History  Problem Relation Age of Onset   Heart failure Mother    Heart disease Mother    Hypertension Mother    Other Mother        varicose veins   Deep vein thrombosis Mother    Varicose Veins Mother    Heart attack Mother    Hypertension Sister        X 2   Diabetes Sister    Hyperlipidemia Sister    Other Sister        varicose veins   Heart disease Father    Diabetes Father  Hyperlipidemia Father    Hypertension Father    Heart attack Father    Heart disease Brother        MI in late 66s   Hyperlipidemia Brother    Heart attack Brother    Coronary artery disease Other    Diabetes Other    Parkinsonism Brother    Heart failure Brother    Hypertension Daughter    Social History   Socioeconomic History   Marital status: Married    Spouse name: [place]   Number of children: 1   Years of education: Not on file   Highest education level: 12th grade  Occupational History   Not on file  Tobacco Use   Smoking status: Light Smoker    Current packs/day: 1.00    Average packs/day: 1 pack/day for 50.0 years (50.0 ttl pk-yrs)    Types: E-cigarettes, Cigarettes   Smokeless tobacco: Never   Tobacco comments:    e-cigs  Vaping Use   Vaping status: Never Used  Substance and Sexual Activity   Alcohol use: No   Drug use: Yes    Types: Hydrocodone    Sexual activity: Not on file  Other Topics Concern   Not on file  Social History Narrative   Has 3 caffeinated bev a day      Exercise: none      Lives with husband   Social Drivers of Corporate investment banker Strain: Low Risk  (02/29/2024)   Overall Financial Resource Strain (CARDIA)    Difficulty of Paying Living Expenses: Not very hard  Food Insecurity: No Food Insecurity (02/29/2024)   Hunger Vital Sign    Worried About Running Out of Food in the Last Year: Never true    Ran  Out of Food in the Last Year: Never true  Transportation Needs: No Transportation Needs (02/29/2024)   PRAPARE - Administrator, Civil Service (Medical): No    Lack of Transportation (Non-Medical): No  Physical Activity: Inactive (02/29/2024)   Exercise Vital Sign    Days of Exercise per Week: 0 days    Minutes of Exercise per Session: 0 min  Stress: Stress Concern Present (02/29/2024)   [employer] of Occupational Health - Occupational Stress Questionnaire    Feeling of Stress : To some extent  Social Connections: Moderately Isolated (02/29/2024)   Social Connection and Isolation Panel [NHANES]    Frequency of Communication with Friends and Family: More than three times a week    Frequency of Social Gatherings with Friends and Family: Once a week    Attends Religious Services: Never    Database administrator or Organizations: No    Attends Engineer, structural: Never    Marital Status: Married    Tobacco Counseling Ready to quit: Not Answered Counseling given: Not Answered Tobacco comments: e-cigs    Clinical Intake:  Pre-visit preparation completed: Yes  Pain : 0-10 Pain Score: 8  Pain Type: Chronic pain Pain Location: Back Pain Descriptors / Indicators: Aching, Discomfort Pain Onset: More than a month ago Pain Frequency: Constant Pain Relieving Factors: Oxycodone  Effect of Pain on Daily Activities: walking  Pain Relieving Factors: Oxycodone   BMI - recorded: 26.09 Nutritional Status: BMI 25 -29 Overweight Nutritional Risks: None Diabetes: Yes CBG done?: Yes (per pt 145) CBG resulted in Enter/ Edit results?: No Did pt. bring in CBG monitor from home?: No  Lab Results  Component Value Date   HGBA1C 7.1 (H) 09/23/2023   HGBA1C 6.9 (  H) 03/19/2023   HGBA1C 6.4 (A) 09/16/2022     How often do you need to have someone help you when you read instructions, pamphlets, or other written materials from your doctor or pharmacy?: 1 -  Never  Interpreter Needed?: No  Information entered by :: Paulyne Mooty, RMA   Activities of Daily Living     02/29/2024    2:18 PM 06/22/2023   11:29 AM  In your present state of health, do you have any difficulty performing the following activities:  Hearing? 1 0  Comment Hearing loss of left ear   Vision? 0 0  Difficulty concentrating or making decisions? 0 0  Walking or climbing stairs? 0 0  Dressing or bathing? 0 0  Doing errands, shopping? 0 1  Comment her husband drives her only due to pain  Preparing Food and eating ? N N  Using the Toilet? N N  In the past six months, have you accidently leaked urine? N N  Do you have problems with loss of bowel control? N N  Managing your Medications? N N  Managing your Finances? N N  Housekeeping or managing your Housekeeping? N N    Patient Care Team: Colene Dauphin, MD as PCP - General (Internal Medicine) Wenona Hamilton, MD as PCP - Cardiology (Cardiology) Melodie Spry, MD as Consulting Physician (Nephrology) [place], Patricia Boon [facility])  Indicate any recent Medical Services you may have received from other than [facility] providers in the past year (date may be approximate).     Assessment:   This is a routine wellness examination for Sheila Melton.  Hearing/Vision screen Hearing Screening - Comments:: Hearing loss of left ear   Goals Addressed   None    Depression Screen     02/29/2024    2:38 PM 09/23/2023    9:27 AM 06/22/2023   11:28 AM 03/17/2023    3:27 PM 09/16/2022    3:04 PM 08/11/2022    9:56 AM 07/21/2022    3:21 PM  PHQ 2/9 Scores  PHQ - 2 Score 2 0 1 0 1 0 5  PHQ- 9 Score 2 0  0       Fall Risk     02/29/2024    2:34 PM 09/23/2023    9:26 AM 06/22/2023   11:29 AM 03/17/2023    3:27 PM 09/16/2022    3:04 PM  Fall Risk   Falls in the past year? 1 0 0 0 0  Number falls in past yr: 0 0 0 0 0  Injury with Fall? 0 0 0 0 0  Risk for fall due to :  No Fall Risks No Fall Risks No Fall Risks No Fall Risks  Follow up  Falls evaluation completed;Falls prevention discussed Falls evaluation completed Falls evaluation completed Falls evaluation completed Falls evaluation completed    MEDICARE RISK AT HOME:  Medicare Risk at Home Any stairs in or around the home?: Yes If so, are there any without handrails?: Yes Home free of loose throw rugs in walkways, pet beds, electrical cords, etc?: Yes Adequate lighting in your home to reduce risk of falls?: Yes Life alert?: No Use of a cane, walker or w/c?: Yes Grab bars in the bathroom?: Yes Shower chair or bench in shower?: Yes Elevated toilet seat or a handicapped toilet?: Yes  TIMED UP AND GO:  Was the test performed?  No  Cognitive Function: 6CIT completed        06/22/2023   11:30 AM  03/19/2022    2:13 PM  6CIT Screen  What Year? 0 points 0 points  What month? 0 points 0 points  What time? 0 points 0 points  Count back from 20 0 points 0 points  Months in reverse 0 points 0 points  Repeat phrase 0 points 0 points  Total Score 0 points 0 points    Immunizations Immunization History  Administered Date(s) Administered   Fluad Quad(high Dose 65+) 08/24/2019, 08/21/2020, 08/21/2021, 09/16/2022   Fluad Trivalent(High Dose 65+) 08/07/2023   Influenza Split 08/28/2011, 09/07/2012   Influenza Whole 09/06/2008, 08/30/2009, 10/11/2010   Influenza, High Dose Seasonal PF 08/24/2013, 08/18/2014, 08/07/2015, 08/08/2016, 07/31/2017, 08/10/2018   PFIZER(Purple Top)SARS-COV-2 Vaccination 11/29/2019, 12/19/2019, 11/16/2020   Pfizer Covid-19 Vaccine Bivalent Booster 29yrs & up 11/16/2020   Pneumococcal Conjugate-13 09/26/2015   Pneumococcal Polysaccharide-23 12/10/2009    Screening Tests Health Maintenance  Topic Date Due   Zoster Vaccines- Shingrix (1 of 2) Never done   FOOT EXAM  05/21/2022   OPHTHALMOLOGY EXAM  01/10/2023   COVID-19 Vaccine (5 - 2024-25 season) 07/12/2023   Diabetic kidney evaluation - Urine ACR  03/18/2024   DEXA SCAN  03/16/2024  (Originally 12/06/2022)   DTaP/Tdap/Td (1 - Tdap) 09/22/2024 (Originally 12/29/1959)   HEMOGLOBIN A1C  03/22/2024   INFLUENZA VACCINE  06/10/2024   Medicare Annual Wellness (AWV)  06/21/2024   Diabetic kidney evaluation - eGFR measurement  09/22/2024   Pneumonia Vaccine 78+ Years old  Completed   HPV VACCINES  Aged Out   Meningococcal B Vaccine  Aged Out    Health Maintenance  Health Maintenance Due  Topic Date Due   Zoster Vaccines- Shingrix (1 of 2) Never done   FOOT EXAM  05/21/2022   OPHTHALMOLOGY EXAM  01/10/2023   COVID-19 Vaccine (5 - 2024-25 season) 07/12/2023   Diabetic kidney evaluation - Urine ACR  03/18/2024   Health Maintenance Items Addressed: See Nurse Notes  Additional Screening:  Vision Screening: Recommended annual ophthalmology exams for early detection of glaucoma and other disorders of the eye.  Dental Screening: Recommended annual dental exams for proper oral hygiene  Community Resource Referral / Chronic Care Management: CRR required this visit?  No   CCM required this visit?  No     Plan:     I have personally reviewed and noted the following in the patient's chart:   Medical and social history Use of alcohol, tobacco or illicit drugs  Current medications and supplements including opioid prescriptions. Patient is currently taking opioid prescriptions. Information provided to patient regarding non-opioid alternatives. Patient advised to discuss non-opioid treatment plan with their provider. Functional ability and status Nutritional status Physical activity Advanced directives List of other physicians Hospitalizations, surgeries, and ER visits in previous 12 months Vitals Screenings to include cognitive, depression, and falls Referrals and appointments  In addition, I have reviewed and discussed with patient certain preventive protocols, quality metrics, and best practice recommendations. A written personalized care plan for preventive  services as well as general preventive health recommendations were provided to patient.     Georgeanna Radziewicz L Amato Sevillano, CMA   02/29/2024   After Visit Summary: (MyChart) Due to this being a telephonic visit, the after visit summary with patients personalized plan was offered to patient via MyChart   Notes: Please refer to Routing Comments.

## 2024-03-06 ENCOUNTER — Other Ambulatory Visit: Payer: Self-pay | Admitting: Internal Medicine

## 2024-03-14 ENCOUNTER — Other Ambulatory Visit: Payer: Self-pay

## 2024-03-21 ENCOUNTER — Encounter: Payer: Self-pay | Admitting: Internal Medicine

## 2024-03-21 DIAGNOSIS — N184 Chronic kidney disease, stage 4 (severe): Secondary | ICD-10-CM | POA: Diagnosis not present

## 2024-03-21 NOTE — Progress Notes (Unsigned)
 Subjective:    Patient ID: Sheila Melton, female    DOB: Jul 03, 1941, 83 y.o.   MRN: [id number]     HPI Demri is here for follow up of her chronic medical problems.  BP yesterday 100/50.  Sugar 226 at 12:45.    Yesterday she fell when going to her nephrologist's office for blood work.  She was lightheaded.  She has been having lightheaded spells.  Had an epidural injection for her back pain - her sugars was very high x 2 weeks.    Medications and allergies reviewed with patient and updated if appropriate.  Current Outpatient Medications on File Prior to Visit  Medication Sig Dispense Refill   amLODipine  (NORVASC ) 5 MG tablet Take 5 mg by mouth daily.     ascorbic Acid (VITAMIN C) 500 MG CPCR 1 cap PO QD     B Complex-C (SUPER B COMPLEX PO) Take 1 tablet by mouth daily.      Blood Glucose Monitoring Suppl (ONE TOUCH ULTRA 2) w/Device KIT Use as advised 1 each 0   calcium  carbonate (TUMS - DOSED IN MG ELEMENTAL CALCIUM ) 500 MG chewable tablet Chew 1 tablet by mouth daily as needed for indigestion or heartburn.     Cholecalciferol  (VITAMIN D3) 125 MCG (5000 UT) TABS Take 5,000 Units by mouth daily.     diazepam  (VALIUM ) 5 MG tablet TAKE ONE TABLET BY MOUTH AT BEDTIME AS NEEDED. 30 tablet 5   Dulaglutide  (TRULICITY ) 1.5 MG/0.5ML SOAJ Inject 1.5 mg into the skin once a week. 2 mL 3   fexofenadine (ALLEGRA) 180 MG tablet Take 180 mg by mouth at bedtime.     FLUoxetine  (PROZAC ) 10 MG capsule TAKE ONE CAPSULE BY MOUTH DAILY 90 capsule 2   furosemide  (LASIX ) 40 MG tablet TAKE 1/2 TO 1 TABLET BY MOUTH DAILY AS NEEDED FOR FLUID 90 tablet 0   glipiZIDE  (GLUCOTROL ) 5 MG tablet TAKE ONE TABLET BY MOUTH EVERY MORNING and TAKE TWO TABLETS EVERY EVENING 270 tablet 0   glucose blood (ONETOUCH ULTRA) test strip CHECK BLOOD SUGAR TWICE DAILY 100 strip 3   KERENDIA 10 MG TABS Take 1 tablet by mouth daily.     labetalol  (NORMODYNE ) 300 MG tablet Take 1 tablet (300 mg total) by mouth 2 (two) times  daily.     Liniments (SALONPAS  PAIN RELIEF PATCH EX) Apply 1 patch topically daily as needed (back pain).     losartan  (COZAAR ) 100 MG tablet Take 1 tablet (100 mg total) by mouth daily. 90 tablet 2   OVER THE COUNTER MEDICATION CBD Gummies 2 each daily     oxyCODONE -acetaminophen  (PERCOCET/ROXICET) 5-325 MG tablet Take 1 tablet by mouth 3 (three) times daily as needed.     rosuvastatin  (CRESTOR ) 20 MG tablet TAKE ONE TABLET BY MOUTH DAILY 90 tablet 0   Triamcinolone  Acetonide (NASACORT  ALLERGY 24HR NA) Place 1 spray into the nose 2 (two) times daily as needed (congestion).     vitamin B-12 (CYANOCOBALAMIN) 1000 MCG tablet Take 1,000 mcg by mouth daily.     vitamin E 180 MG (400 UNITS) capsule Take 400 Units by mouth daily.     No current facility-administered medications on file prior to visit.     Review of Systems  Constitutional:  Negative for fever.  Respiratory:  Negative for cough, shortness of breath and wheezing.   Cardiovascular:  Negative for chest pain, palpitations and leg swelling.  Neurological:  Positive for light-headedness. Negative for headaches.  Objective:   Vitals:   03/22/24 1415  BP: 104/60  Pulse: 85  Temp: 98.7 F (37.1 C)  SpO2: 96%   BP Readings from Last 3 Encounters:  03/22/24 104/60  12/29/23 96/60  09/23/23 126/70   Wt Readings from Last 3 Encounters:  03/22/24 148 lb (67.1 kg)  02/29/24 152 lb (68.9 kg)  12/29/23 157 lb (71.2 kg)   Body mass index is 25.4 kg/m.    Physical Exam Constitutional:      General: She is not in acute distress.    Appearance: Normal appearance.  HENT:     Head: Normocephalic and atraumatic.  Eyes:     Conjunctiva/sclera: Conjunctivae normal.  Cardiovascular:     Rate and Rhythm: Normal rate and regular rhythm.     Heart sounds: Normal heart sounds.  Pulmonary:     Effort: Pulmonary effort is normal. No respiratory distress.     Breath sounds: Normal breath sounds. No wheezing.  Musculoskeletal:      Cervical back: Neck supple.     Right lower leg: No edema.     Left lower leg: No edema.  Lymphadenopathy:     Cervical: No cervical adenopathy.  Skin:    General: Skin is warm and dry.     Findings: No rash.  Neurological:     Mental Status: She is alert. Mental status is at baseline.  Psychiatric:        Mood and Affect: Mood normal.        Behavior: Behavior normal.        Lab Results  Component Value Date   WBC 9.1 09/23/2023   HGB 10.9 (L) 09/23/2023   HCT 32.9 (L) 09/23/2023   PLT 291.0 09/23/2023   GLUCOSE 118 (H) 09/23/2023   CHOL 154 09/23/2023   TRIG 383.0 (H) 09/23/2023   HDL 37.30 (L) 09/23/2023   LDLDIRECT 47.0 03/19/2023   LDLCALC 41 09/23/2023   ALT 17 09/23/2023   AST 20 09/23/2023   NA 137 09/23/2023   K 3.9 09/23/2023   CL 103 09/23/2023   CREATININE 2.82 (H) 09/23/2023   BUN 35 (H) 09/23/2023   CO2 25 09/23/2023   TSH 3.19 09/23/2023   INR 0.95 02/12/2017   HGBA1C 7.1 (H) 09/23/2023   MICROALBUR 130.8 (H) 03/19/2023     Assessment & Plan:    See Problem List for Assessment and Plan of chronic medical problems.

## 2024-03-22 ENCOUNTER — Ambulatory Visit (INDEPENDENT_AMBULATORY_CARE_PROVIDER_SITE_OTHER): Payer: PPO | Admitting: Internal Medicine

## 2024-03-22 ENCOUNTER — Ambulatory Visit: Payer: Self-pay | Admitting: Internal Medicine

## 2024-03-22 ENCOUNTER — Other Ambulatory Visit (HOSPITAL_COMMUNITY): Payer: Self-pay

## 2024-03-22 ENCOUNTER — Ambulatory Visit: Payer: Self-pay

## 2024-03-22 VITALS — BP 104/60 | HR 85 | Temp 98.7°F | Ht 64.0 in | Wt 148.0 lb

## 2024-03-22 DIAGNOSIS — R42 Dizziness and giddiness: Secondary | ICD-10-CM | POA: Diagnosis not present

## 2024-03-22 DIAGNOSIS — E782 Mixed hyperlipidemia: Secondary | ICD-10-CM | POA: Diagnosis not present

## 2024-03-22 DIAGNOSIS — F411 Generalized anxiety disorder: Secondary | ICD-10-CM

## 2024-03-22 DIAGNOSIS — M810 Age-related osteoporosis without current pathological fracture: Secondary | ICD-10-CM

## 2024-03-22 DIAGNOSIS — I10 Essential (primary) hypertension: Secondary | ICD-10-CM | POA: Diagnosis not present

## 2024-03-22 DIAGNOSIS — G479 Sleep disorder, unspecified: Secondary | ICD-10-CM | POA: Diagnosis not present

## 2024-03-22 DIAGNOSIS — E1159 Type 2 diabetes mellitus with other circulatory complications: Secondary | ICD-10-CM | POA: Diagnosis not present

## 2024-03-22 DIAGNOSIS — F3289 Other specified depressive episodes: Secondary | ICD-10-CM

## 2024-03-22 DIAGNOSIS — Z7985 Long-term (current) use of injectable non-insulin antidiabetic drugs: Secondary | ICD-10-CM | POA: Diagnosis not present

## 2024-03-22 DIAGNOSIS — Z7984 Long term (current) use of oral hypoglycemic drugs: Secondary | ICD-10-CM

## 2024-03-22 DIAGNOSIS — N184 Chronic kidney disease, stage 4 (severe): Secondary | ICD-10-CM

## 2024-03-22 LAB — LIPID PANEL
Cholesterol: 133 mg/dL (ref 0–200)
HDL: 41.6 mg/dL (ref >39.00)
LDL Cholesterol: 38 mg/dL (ref 0–99)
NonHDL: 91.12
Total CHOL/HDL Ratio: 3
Triglycerides: 267 mg/dL — ABNORMAL HIGH (ref 0.0–149.0)
VLDL: 53.4 mg/dL — ABNORMAL HIGH (ref 0.0–40.0)

## 2024-03-22 LAB — COMPREHENSIVE METABOLIC PANEL WITH GFR
ALT: 21 U/L (ref 0–35)
AST: 22 U/L (ref 0–37)
Albumin: 4.4 g/dL (ref 3.5–5.2)
Alkaline Phosphatase: 67 U/L (ref 39–117)
BUN: 50 mg/dL — ABNORMAL HIGH (ref 6–23)
CO2: 22 meq/L (ref 19–32)
Calcium: 9.6 mg/dL (ref 8.4–10.5)
Chloride: 103 meq/L (ref 96–112)
Creatinine, Ser: 3.14 mg/dL — ABNORMAL HIGH (ref 0.40–1.20)
GFR: 13.41 mL/min — CL (ref >60.00)
Glucose, Bld: 73 mg/dL (ref 70–99)
Potassium: 4 meq/L (ref 3.5–5.1)
Sodium: 138 meq/L (ref 135–145)
Total Bilirubin: 0.4 mg/dL (ref 0.2–1.2)
Total Protein: 7.2 g/dL (ref 6.0–8.3)

## 2024-03-22 LAB — HEMOGLOBIN A1C: Hgb A1c MFr Bld: 7 % — ABNORMAL HIGH (ref 4.6–6.5)

## 2024-03-22 MED ORDER — AMLODIPINE BESYLATE 2.5 MG PO TABS: 2.5000 mg | ORAL_TABLET | Freq: Every day | ORAL | 1 refills | Status: DC

## 2024-03-22 MED ORDER — TRULICITY 3 MG/0.5ML ~~LOC~~ SOAJ: 3.0000 mg | SUBCUTANEOUS | 0 refills | Status: DC

## 2024-03-22 MED ORDER — TRULICITY 3 MG/0.5ML ~~LOC~~ SOAJ
3.0000 mg | SUBCUTANEOUS | 0 refills | Status: DC
  Filled 2024-03-22: qty 2, 28d supply, fill #0

## 2024-03-22 NOTE — Assessment & Plan Note (Signed)
Chronic DEXA up-to-date  

## 2024-03-22 NOTE — Telephone Encounter (Signed)
 Critical lab result from Sheila Melton - GFR 13.41. Attempted to call [facility] at 1732, LVM to call back.  RN relayed critical result GFR 13.41 at 1754 to Dr Vedia Geralds.  Copied from CRM [id number]. Topic: Clinical - Red Word Triage >> Mar 22, 2024  5:22 PM Sheila Melton wrote: [facility] that prompted transfer to Nurse Triage: critical lab results

## 2024-03-22 NOTE — Assessment & Plan Note (Addendum)
 Chronic  Lab Results  Component Value Date   HGBA1C 7.1 (H) 09/23/2023   Sugars not ideally controlled Check A1c Increase Trulicity  to 3 mg weekly,  Continue glipizide  5 mg q am, 10 mg q pm - will try to increase trulicity  and decrease glipizide  Stressed diabetic diet

## 2024-03-22 NOTE — Assessment & Plan Note (Signed)
 Chronic Lipid panel, CMP, TSH Continue Crestor 20 mg daily

## 2024-03-22 NOTE — Assessment & Plan Note (Addendum)
 Chronic Blood pressure over controlled Decrease amlodipine  to 2.5 mg daily cmp Continue labetalol  300 mg twice daily, losartan  100 mg daily Monitor Bp at home

## 2024-03-22 NOTE — Assessment & Plan Note (Addendum)
 Chronic Check CMP, CBC Following with Dr. Lydia Sams On Sheila Melton Sugars not ideally controlled Blood pressure over controlled - will adjust medication

## 2024-03-22 NOTE — Patient Instructions (Addendum)
      Blood work was ordered.       Medications changes include :   decrease amlodipine  to 2.5 mg daily.  Increase Trulicity  to 3 mg weekly      Return in about 4 months (around 07/23/2024) for follow up.

## 2024-03-22 NOTE — Assessment & Plan Note (Signed)
 Chronic Controlled, Stable Continue fluoxetine 10 mg daily

## 2024-03-22 NOTE — Assessment & Plan Note (Signed)
Chronic °Controlled, Stable °Continue diazepam 5 mg nightly °

## 2024-03-22 NOTE — Assessment & Plan Note (Signed)
 New Having intermittent lightheadedness and fell yesterday BP yesterday a nephrology office and here today too low Decrease amlodipine  to 2.5 mg  Monitor bp at home  Call if lightheadedness does not improve

## 2024-03-25 ENCOUNTER — Other Ambulatory Visit: Payer: Self-pay

## 2024-03-30 ENCOUNTER — Other Ambulatory Visit: Payer: Self-pay

## 2024-03-30 DIAGNOSIS — I7143 Infrarenal abdominal aortic aneurysm, without rupture: Secondary | ICD-10-CM

## 2024-03-30 DIAGNOSIS — I129 Hypertensive chronic kidney disease with stage 1 through stage 4 chronic kidney disease, or unspecified chronic kidney disease: Secondary | ICD-10-CM | POA: Diagnosis not present

## 2024-03-30 DIAGNOSIS — B965 Pseudomonas (aeruginosa) (mallei) (pseudomallei) as the cause of diseases classified elsewhere: Secondary | ICD-10-CM | POA: Diagnosis not present

## 2024-03-30 DIAGNOSIS — D631 Anemia in chronic kidney disease: Secondary | ICD-10-CM | POA: Diagnosis not present

## 2024-03-30 DIAGNOSIS — N390 Urinary tract infection, site not specified: Secondary | ICD-10-CM | POA: Diagnosis not present

## 2024-03-30 DIAGNOSIS — N2581 Secondary hyperparathyroidism of renal origin: Secondary | ICD-10-CM | POA: Diagnosis not present

## 2024-03-30 DIAGNOSIS — N184 Chronic kidney disease, stage 4 (severe): Secondary | ICD-10-CM | POA: Diagnosis not present

## 2024-03-31 ENCOUNTER — Other Ambulatory Visit: Payer: Self-pay

## 2024-04-05 ENCOUNTER — Other Ambulatory Visit: Payer: Self-pay

## 2024-04-07 ENCOUNTER — Other Ambulatory Visit: Payer: Self-pay

## 2024-04-12 ENCOUNTER — Other Ambulatory Visit

## 2024-04-19 ENCOUNTER — Ambulatory Visit: Payer: Self-pay | Admitting: *Deleted

## 2024-04-19 ENCOUNTER — Emergency Department (HOSPITAL_COMMUNITY)
Admission: EM | Admit: 2024-04-19 | Discharge: 2024-04-19 | Disposition: A | Discharge status: Home / Self Care | Attending: Emergency Medicine | Admitting: Emergency Medicine

## 2024-04-19 ENCOUNTER — Other Ambulatory Visit

## 2024-04-19 ENCOUNTER — Encounter (HOSPITAL_COMMUNITY): Payer: Self-pay

## 2024-04-19 DIAGNOSIS — Z79899 Other long term (current) drug therapy: Secondary | ICD-10-CM | POA: Insufficient documentation

## 2024-04-19 DIAGNOSIS — E1122 Type 2 diabetes mellitus with diabetic chronic kidney disease: Secondary | ICD-10-CM | POA: Insufficient documentation

## 2024-04-19 DIAGNOSIS — E860 Dehydration: Secondary | ICD-10-CM | POA: Insufficient documentation

## 2024-04-19 DIAGNOSIS — N189 Chronic kidney disease, unspecified: Secondary | ICD-10-CM | POA: Insufficient documentation

## 2024-04-19 DIAGNOSIS — Z91040 Latex allergy status: Secondary | ICD-10-CM | POA: Diagnosis not present

## 2024-04-19 DIAGNOSIS — I129 Hypertensive chronic kidney disease with stage 1 through stage 4 chronic kidney disease, or unspecified chronic kidney disease: Secondary | ICD-10-CM | POA: Insufficient documentation

## 2024-04-19 DIAGNOSIS — R197 Diarrhea, unspecified: Secondary | ICD-10-CM

## 2024-04-19 LAB — URINALYSIS, ROUTINE W REFLEX MICROSCOPIC
Bilirubin Urine: NEGATIVE
Glucose, UA: NEGATIVE mg/dL
Hgb urine dipstick: NEGATIVE
Ketones, ur: NEGATIVE mg/dL
Nitrite: NEGATIVE
Protein, ur: 100 mg/dL — AB
Specific Gravity, Urine: 1.013 (ref 1.005–1.030)
pH: 5 (ref 5.0–8.0)

## 2024-04-19 LAB — COMPREHENSIVE METABOLIC PANEL WITH GFR
ALT: 20 U/L (ref 0–44)
AST: 26 U/L (ref 15–41)
Albumin: 3.7 g/dL (ref 3.5–5.0)
Alkaline Phosphatase: 67 U/L (ref 38–126)
Anion gap: 11 (ref 5–15)
BUN: 44 mg/dL — ABNORMAL HIGH (ref 8–23)
CO2: 20 mmol/L — ABNORMAL LOW (ref 22–32)
Calcium: 9.2 mg/dL (ref 8.9–10.3)
Chloride: 104 mmol/L (ref 98–111)
Creatinine, Ser: 3.11 mg/dL — ABNORMAL HIGH (ref 0.44–1.00)
GFR, Estimated: 15 mL/min — ABNORMAL LOW (ref >60)
Glucose, Bld: 123 mg/dL — ABNORMAL HIGH (ref 70–99)
Potassium: 3.5 mmol/L (ref 3.5–5.1)
Sodium: 135 mmol/L (ref 135–145)
Total Bilirubin: 0.6 mg/dL (ref 0.0–1.2)
Total Protein: 7.2 g/dL (ref 6.5–8.1)

## 2024-04-19 LAB — CBC
HCT: 31.9 % — ABNORMAL LOW (ref 36.0–46.0)
Hemoglobin: 10 g/dL — ABNORMAL LOW (ref 12.0–15.0)
MCH: 29.8 pg (ref 26.0–34.0)
MCHC: 31.3 g/dL (ref 30.0–36.0)
MCV: 94.9 fL (ref 80.0–100.0)
Platelets: 288 10*3/uL (ref 150–400)
RBC: 3.36 MIL/uL — ABNORMAL LOW (ref 3.87–5.11)
RDW: 13.3 % (ref 11.5–15.5)
WBC: 11.2 10*3/uL — ABNORMAL HIGH (ref 4.0–10.5)
nRBC: 0 % (ref 0.0–0.2)

## 2024-04-19 LAB — LIPASE, BLOOD: Lipase: 140 U/L — ABNORMAL HIGH (ref 11–51)

## 2024-04-19 LAB — MAGNESIUM: Magnesium: 2 mg/dL (ref 1.7–2.4)

## 2024-04-19 MED ORDER — ONDANSETRON 8 MG PO TBDP: 8.0000 mg | ORAL_TABLET | Freq: Three times a day (TID) | ORAL | 0 refills | Status: DC | PRN

## 2024-04-19 MED ORDER — LACTATED RINGERS IV BOLUS
1000.0000 mL | Freq: Once | INTRAVENOUS | Status: AC
  Administered 2024-04-19: 1000 mL via INTRAVENOUS

## 2024-04-19 NOTE — ED Provider Notes (Signed)
 [place] EMERGENCY DEPARTMENT AT [facility] Provider Note   CSN: [id number] Arrival date & time: 04/19/24  1341     History  Chief Complaint  Patient presents with   Diarrhea   Emesis    Sheila Melton is a 83 y.o. female.  HPI    83 year old female comes in with chief complaint of vomiting, diarrhea.  Patient has history of tobacco use disorder, hypertension, infrarenal AAA, diabetes and CKD.  She states that her nephrologist recently diagnosed her with infection and put her on Cipro  for 10 days.  At the completion of the antibiotics, she started having diarrhea.  She has been having diarrhea now for the last week, up to 5 episodes a day.  Normally she is constipated.  Bowel movements are loose, watery.  She has noticed that they are extremely dark, but she has been taking Pepto-Bismol as well.  Patient denies any associated fevers, chills and did not have any nausea or vomiting until today.  She had emesis into 1 episode this morning.  Patient has had some weakness, falls and she talked to her PCP who advised that she come to the ER for likely dehydration.  Home Medications Prior to Admission medications   Medication Sig Start Date End Date Taking? Authorizing Provider  ondansetron  (ZOFRAN -ODT) 8 MG disintegrating tablet Take 1 tablet (8 mg total) by mouth every 8 (eight) hours as needed for nausea. 04/19/24  Yes Deatra Face, MD  amLODipine  (NORVASC ) 2.5 MG tablet Take 1 tablet (2.5 mg total) by mouth daily. 03/22/24   Colene Dauphin, MD  ascorbic Acid (VITAMIN C) 500 MG CPCR 1 cap PO QD    [provider]  B Complex-C (SUPER B COMPLEX PO) Take 1 tablet by mouth daily.     [provider]  Blood Glucose Monitoring Suppl (ONE TOUCH ULTRA 2) w/Device KIT Use as advised 10/21/16   Emilie Harden, MD  calcium  carbonate (TUMS - DOSED IN MG ELEMENTAL CALCIUM ) 500 MG chewable tablet Chew 1 tablet by mouth daily as needed for indigestion or heartburn.     [provider]  Cholecalciferol  (VITAMIN D3) 125 MCG (5000 UT) TABS Take 5,000 Units by mouth daily.    [provider]  diazepam  (VALIUM ) 5 MG tablet TAKE ONE TABLET BY MOUTH AT BEDTIME AS NEEDED. 01/27/24   Colene Dauphin, MD  Dulaglutide  (TRULICITY ) 3 MG/0.5ML SOAJ Inject 3 mg as directed once a week. 03/22/24   Colene Dauphin, MD  fexofenadine (ALLEGRA) 180 MG tablet Take 180 mg by mouth at bedtime.    [provider]  FLUoxetine  (PROZAC ) 10 MG capsule TAKE ONE CAPSULE BY MOUTH DAILY 12/08/23   Colene Dauphin, MD  furosemide  (LASIX ) 40 MG tablet TAKE 1/2 TO 1 TABLET BY MOUTH DAILY AS NEEDED FOR FLUID 03/07/24   Colene Dauphin, MD  glipiZIDE  (GLUCOTROL ) 5 MG tablet TAKE ONE TABLET BY MOUTH EVERY MORNING and TAKE TWO TABLETS EVERY EVENING 03/07/24   Burns, Beckey Bourgeois, MD  glucose blood (ONETOUCH ULTRA) test strip CHECK BLOOD SUGAR TWICE DAILY 03/17/23   Burns, Beckey Bourgeois, MD  KERENDIA 10 MG TABS Take 1 tablet by mouth daily. 05/14/21   [provider]  labetalol  (NORMODYNE ) 300 MG tablet Take 1 tablet (300 mg total) by mouth 2 (two) times daily. 12/25/21   Colene Dauphin, MD  Liniments (SALONPAS  PAIN RELIEF PATCH EX) Apply 1 patch topically daily as needed (back pain).    [provider]  losartan  (COZAAR ) 100 MG tablet Take 1 tablet (100 mg total) by mouth daily. 12/08/23   Colene Dauphin, MD  OVER THE COUNTER MEDICATION CBD Gummies 2 each daily    [provider]  oxyCODONE -acetaminophen  (PERCOCET/ROXICET) 5-325 MG tablet Take 1 tablet by mouth 3 (three) times daily as needed. 05/07/20   [provider]  rosuvastatin  (CRESTOR ) 20 MG tablet TAKE ONE TABLET BY MOUTH DAILY 03/07/24   Colene Dauphin, MD  Triamcinolone  Acetonide (NASACORT  ALLERGY 24HR NA) Place 1 spray into the nose 2 (two) times daily as needed (congestion).    [provider]  vitamin B-12 (CYANOCOBALAMIN) 1000 MCG tablet Take 1,000 mcg by mouth daily.    [provider]  vitamin E 180 MG (400 UNITS) capsule Take 400 Units by mouth daily.    [provider]      Allergies    Rocephin  [ceftriaxone  sodium in dextrose ], Amoxicillin, Captopril, Simvastatin , Ciprofloxacin , Semaglutide , Adhesive [tape], and Latex    Review of Systems   Review of Systems  All other systems reviewed and are negative.   Physical Exam Updated Vital Signs BP (!) 157/79   Pulse 90   Temp 98.6 F (37 C) (Oral)   Resp 18   Ht 5\' 4"  (1.626 m)   Wt 67.1 kg   SpO2 94%   BMI 25.40 kg/m  Physical Exam Vitals and nursing note reviewed.  Constitutional:      Appearance: She is well-developed.  HENT:     Head: Atraumatic.     Mouth/Throat:     Mouth: Mucous membranes are dry.  Eyes:     Extraocular Movements: Extraocular movements intact.     Pupils: Pupils are equal, round, and reactive to light.  Cardiovascular:     Rate and Rhythm: Normal rate.  Pulmonary:     Effort: Pulmonary effort is normal.  Musculoskeletal:     Cervical back: Normal range of motion and neck supple.  Skin:    General: Skin is warm and dry.  Neurological:     Mental Status: She is alert and oriented to person, place, and time.     ED Results / Procedures / Treatments   Labs (all labs ordered are listed, but only abnormal results are displayed) Labs Reviewed  LIPASE, BLOOD - Abnormal; Notable for the following components:      Result Value   Lipase 140 (*)    All other components within normal limits  COMPREHENSIVE METABOLIC PANEL WITH GFR - Abnormal; Notable for the following components:   CO2 20 (*)    Glucose, Bld 123 (*)    BUN 44 (*)    Creatinine, Ser 3.11 (*)    GFR, Estimated 15 (*)    All other components within normal limits  CBC - Abnormal; Notable for the following components:   WBC 11.2 (*)    RBC 3.36 (*)    Hemoglobin 10.0 (*)    HCT 31.9 (*)    All other components within normal limits  C DIFFICILE QUICK SCREEN W PCR REFLEX    GASTROINTESTINAL PANEL BY  PCR, STOOL (REPLACES STOOL CULTURE)  MAGNESIUM  URINALYSIS, ROUTINE W REFLEX MICROSCOPIC    EKG None  Radiology No results found.  Procedures Procedures    Medications Ordered in ED Medications  lactated ringers  bolus 1,000 mL (0 mLs Intravenous Stopped 04/19/24 1719)    ED Course/ Medical Decision Making/ A&P  Medical Decision Making Amount and/or Complexity of Data Reviewed Labs: ordered.  Risk Prescription drug management.   This patient presents to the ED with chief complaint(s) of vomiting, diarrhea, weakness and dehydration with pertinent past medical history of CKD, inferior renal aortic aneurysm, recent infection which was treated with Cipro .The complaint involves an extensive differential diagnosis and also carries with it a high risk of complications and morbidity.    The differential diagnosis includes : Severe electrolyte abnormality, dehydration, acute on chronic renal failure, C. difficile colitis, ileus, early small bowel obstruction.  This does not appear to be an event related to her aneurysm. Patient overall nontoxic-appearing.  The initial plan is to get basic labs, orthostatic blood pressure and also get magnesium levels, C. difficile testing and give patient IV fluids for what appears to be dehydrated.   Additional history obtained: Additional history obtained from spouse Records reviewed Primary Care Documents  Independent labs interpretation:  The following labs were independently interpreted: Patient's lipase is slightly elevated at 140.  CMP is normal.  Creatinine today is 3.11, 4 weeks ago it was 3.14.  Unsure what to make of this elevated lipase.  Patient has no abdominal tenderness.  I have reviewed CT scans from the recent past, there were no concerning findings around the abdomen/pancreas.  No CT scan indicated at this time.  Appears that patient has outpatient CT scan already scheduled, patient states is  delayed because of her current illness.   Treatment and Reassessment: Patient received 1 L of IV fluid.  She has not had any emesis in the ER.  Labs are overall reassuring.  Lipase is slightly elevated, and that has been discussed with the patient.  She has no abdominal tenderness.  She is supposed to get a CT scan soon, advised that she have the physician who is reviewing the CT scan with her also look at the pancreas.  Strict return precautions discussed.  She will call her PCP for follow-up next week.  Final Clinical Impression(s) / ED Diagnoses Final diagnoses:  Dehydration  Diarrhea of presumed infectious origin    Rx / DC Orders ED Discharge Orders          Ordered    ondansetron  (ZOFRAN -ODT) 8 MG disintegrating tablet  Every 8 hours PRN        04/19/24 1727              Deatra Face, MD 04/19/24 1754

## 2024-04-19 NOTE — Discharge Instructions (Addendum)
 The workup in the emergency room is overall reassuring.  Although there is dehydration, there is no evidence of organ damage.  Take Zofran  for nausea and vomiting.  Make sure you are hydrating well.

## 2024-04-19 NOTE — ED Triage Notes (Addendum)
 Pt c/o diarrhea x1 week and emesis x1 episode this morning.  Pt denies pain.  Pt reports diarrhea started after completing 10 days of Cipro .  Pt denies fevers.   Pt reports she has been taking Imodium and Pepto w/o relief.

## 2024-04-19 NOTE — Telephone Encounter (Signed)
 noted

## 2024-04-19 NOTE — Telephone Encounter (Signed)
 FYI Only or Action Required?: FYI only for provider  Patient was last seen in primary care on 03/22/2024 by Colene Dauphin, MD. Called Nurse Triage reporting Diarrhea. Symptoms began today. Interventions attempted: Rest, hydration, or home remedies. Symptoms are: gradually worsening.  Triage Disposition: Go to ED or PCP/Alternative with Approval  Patient/caregiver understands and will follow disposition?: yes        Copied from CRM [id number]. Topic: Clinical - Red Word Triage >> Apr 19, 2024  9:13 AM [place] wrote: Red Word that prompted transfer to Nurse Triage: Patient has had diarrhea for 2 weeks - kidney dr said she had an infection, the antibiotic she was given- that is a side effect of the medication. She is very weak. Reason for Disposition  Patient sounds very sick or weak to the triager  Answer Assessment - Initial Assessment Questions 1. ANTIBIOTIC: "What antibiotic are you taking?" "How many times per day?"     Cipro  x 10 days  2. ANTIBIOTIC ONSET: "When was the antibiotic started?"     Approx 10 days  3. DIARRHEA SEVERITY: "How bad is the diarrhea?" "How many more stools have you had in the past 24 hours than normal?"    - NO DIARRHEA (SCALE 0)   - MILD (SCALE 1-3): Few loose or mushy BMs; increase of 1-3 stools over normal daily number of stools; mild increase in ostomy output.   -  MODERATE (SCALE 4-7): Increase of 4-6 stools daily over normal; moderate increase in ostomy output. * SEVERE (SCALE 8-10; OR 'WORST POSSIBLE'): Increase of 7 or more stools daily over normal; moderate increase in ostomy output; incontinence.     More than 6  4. ONSET: "When did the diarrhea begin?"      After taking antibiotics 5. BM CONSISTENCY: "How loose or watery is the diarrhea?"      Loose  6. VOMITING: "Are you also vomiting?" If Yes, ask: "How many times in the past 24 hours?"      Vomiting x 1 this am  7. ABDOMEN PAIN: "Are you having any abdomen pain?" If Yes, ask: "What does it  feel like?" (e.g., crampy, dull, intermittent, constant)      Yes  8. ABDOMEN PAIN SEVERITY: If present, ask: "How bad is the pain?"  (e.g., Scale 1-10; mild, moderate, or severe)   - MILD (1-3): doesn't interfere with normal activities, abdomen soft and not tender to touch    - MODERATE (4-7): interferes with normal activities or awakens from sleep, abdomen tender to touch    - SEVERE (8-10): excruciating pain, doubled over, unable to do any normal activities       Na  9. ORAL INTAKE: If vomiting, "Have you been able to drink liquids?" "How much liquids have you had in the past 24 hours?"     Yes drinking gatorade  10. HYDRATION: "Any signs of dehydration?" (e.g., dry mouth [not just dry lips], too weak to stand, dizziness, new weight loss) "When did you last urinate?"       dizziness 11. EXPOSURE: "Have you traveled to a foreign country recently?" "Have you been exposed to anyone with diarrhea?" "Could you have eaten any food that was spoiled?"       na 12. OTHER SYMPTOMS: "Do you have any other symptoms?" (e.g., fever, blood in stool)       Dizziness loose BMs and vomiting today . Hx abdominal aneurysm  13. PREGNANCY: "Is there any chance you are pregnant?" "When was your  last menstrual period?"       Na   No available appt with PCP and due to hx abd. Aneurysm and now vomiting , abdominal pain recommended ED. Paitent husband reports he will take patient to [facility]. Requesting if PCP will contact ED . Please advise. Recommended if sx worsen call 911.  Protocols used: Diarrhea on Antibiotics-A-AH

## 2024-04-25 ENCOUNTER — Encounter: Payer: Self-pay | Admitting: Internal Medicine

## 2024-04-25 DIAGNOSIS — E860 Dehydration: Secondary | ICD-10-CM | POA: Insufficient documentation

## 2024-04-25 NOTE — Patient Instructions (Incomplete)
      Blood work was ordered.       Medications changes include :   None

## 2024-04-25 NOTE — Progress Notes (Signed)
 Subjective:    Patient ID: Sheila Melton, female    DOB: [birth year], 83 y.o.   MRN: [id number]     HPI Sheila Melton is here for follow up from the emergency department.  She is here with her husband.   6/10-presented to the ED with diarrhea and vomiting.  She was taking Cipro  x 10 d for urinary tract infection and when she completed the antibiotic she started having diarrhea.  She was having 5 episodes per day for the past week.  The stools were watery and dark, but she was taking Pepto-Bismol.  She denied fevers or chills.  The day she presented to the ED was when she started having nausea.  She vomited once that morning.  She did feel weak and had some falls.  CMP was stable.  WBC slightly elevated, anemia slightly worse, magnesium was normal.  Lipase was elevated.  She received a liter of LR.  UA showed large leukocytes but rare bacteria.  She was discharged home with Zofran .  CT abdomen pelvis was not done in the ED and was scheduled for 1 today to evaluate pancreas given elevated lipase.  After going home had diarrhea about once a day.  She is having abdominal cramping.  No N/V since the ED.  No fever.  Stool is black.  She is not taking Pepto-Bismol in over a week.  She is taking imodium regularly.    3 am this morning she had diarrhea.   She is trying to drink fluids.  Does not drink much water but has been drinking some Gatorade.  He does feel weak.    Medications and allergies reviewed with patient and updated if appropriate.  Current Outpatient Medications on File Prior to Visit  Medication Sig Dispense Refill   ascorbic Acid (VITAMIN C) 500 MG CPCR 1 cap PO QD     B Complex-C (SUPER B COMPLEX PO) Take 1 tablet by mouth daily.      Blood Glucose Monitoring Suppl (ONE TOUCH ULTRA 2) w/Device KIT Use as advised 1 each 0   calcium  carbonate (TUMS - DOSED IN MG ELEMENTAL CALCIUM ) 500 MG chewable tablet Chew 1 tablet by mouth daily as needed for indigestion or heartburn.      Cholecalciferol  (VITAMIN D3) 125 MCG (5000 UT) TABS Take 5,000 Units by mouth daily.     diazepam  (VALIUM ) 5 MG tablet TAKE ONE TABLET BY MOUTH AT BEDTIME AS NEEDED. 30 tablet 5   Dulaglutide  (TRULICITY ) 3 MG/0.5ML SOAJ Inject 3 mg as directed once a week. 2 mL 0   fexofenadine (ALLEGRA) 180 MG tablet Take 180 mg by mouth at bedtime.     FLUoxetine  (PROZAC ) 10 MG capsule TAKE ONE CAPSULE BY MOUTH DAILY 90 capsule 2   furosemide  (LASIX ) 40 MG tablet TAKE 1/2 TO 1 TABLET BY MOUTH DAILY AS NEEDED FOR FLUID 90 tablet 0   glipiZIDE  (GLUCOTROL ) 5 MG tablet TAKE ONE TABLET BY MOUTH EVERY MORNING and TAKE TWO TABLETS EVERY EVENING 270 tablet 0   glucose blood (ONETOUCH ULTRA) test strip CHECK BLOOD SUGAR TWICE DAILY 100 strip 3   KERENDIA 10 MG TABS Take 1 tablet by mouth daily.     labetalol  (NORMODYNE ) 300 MG tablet Take 1 tablet (300 mg total) by mouth 2 (two) times daily.     Liniments (SALONPAS  PAIN RELIEF PATCH EX) Apply 1 patch topically daily as needed (back pain).     losartan  (COZAAR ) 100 MG tablet Take 1 tablet (100  mg total) by mouth daily. 90 tablet 2   ondansetron  (ZOFRAN -ODT) 8 MG disintegrating tablet Take 1 tablet (8 mg total) by mouth every 8 (eight) hours as needed for nausea. 20 tablet 0   OVER THE COUNTER MEDICATION CBD Gummies 2 each daily     oxyCODONE -acetaminophen  (PERCOCET/ROXICET) 5-325 MG tablet Take 1 tablet by mouth 3 (three) times daily as needed.     rosuvastatin  (CRESTOR ) 20 MG tablet TAKE ONE TABLET BY MOUTH DAILY 90 tablet 0   Triamcinolone  Acetonide (NASACORT  ALLERGY 24HR NA) Place 1 spray into the nose 2 (two) times daily as needed (congestion).     vitamin B-12 (CYANOCOBALAMIN) 1000 MCG tablet Take 1,000 mcg by mouth daily.     vitamin E 180 MG (400 UNITS) capsule Take 400 Units by mouth daily.     No current facility-administered medications on file prior to visit.     Review of Systems  Constitutional:  Negative for fever.  Cardiovascular:  Negative for chest  pain and palpitations.  Gastrointestinal:  Positive for abdominal pain and diarrhea. Negative for nausea and vomiting.  Neurological:  Negative for dizziness, light-headedness and headaches.       Objective:   Vitals:   04/26/24 1045  BP: (!) 112/58  Pulse: 95  Temp: 98.4 F (36.9 C)  SpO2: 96%   BP Readings from Last 3 Encounters:  04/26/24 (!) 112/58  04/19/24 (!) 157/79  03/22/24 104/60   Wt Readings from Last 3 Encounters:  04/26/24 150 lb (68 kg)  04/19/24 148 lb (67.1 kg)  03/22/24 148 lb (67.1 kg)   Body mass index is 25.75 kg/m.    Physical Exam Constitutional:      General: She is not in acute distress.    Appearance: Normal appearance. She is not ill-appearing.  HENT:     Head: Normocephalic and atraumatic.   Cardiovascular:     Rate and Rhythm: Normal rate and regular rhythm.  Pulmonary:     Effort: Pulmonary effort is normal.     Breath sounds: Normal breath sounds.  Abdominal:     General: There is no distension.     Palpations: Abdomen is soft.     Tenderness: There is no abdominal tenderness. There is no guarding or rebound.   Musculoskeletal:     Right lower leg: No edema.     Left lower leg: No edema.   Skin:    General: Skin is warm and dry.   Neurological:     Mental Status: She is alert.        Lab Results  Component Value Date   WBC 11.2 (H) 04/19/2024   HGB 10.0 (L) 04/19/2024   HCT 31.9 (L) 04/19/2024   PLT 288 04/19/2024   GLUCOSE 123 (H) 04/19/2024   CHOL 133 03/22/2024   TRIG 267.0 (H) 03/22/2024   HDL 41.60 03/22/2024   LDLDIRECT 47.0 03/19/2023   LDLCALC 38 03/22/2024   ALT 20 04/19/2024   AST 26 04/19/2024   NA 135 04/19/2024   K 3.5 04/19/2024   CL 104 04/19/2024   CREATININE 3.11 (H) 04/19/2024   BUN 44 (H) 04/19/2024   CO2 20 (L) 04/19/2024   TSH 3.19 09/23/2023   INR 0.95 02/12/2017   HGBA1C 7.0 (H) 03/22/2024   MICROALBUR 130.8 (H) 03/19/2023     Assessment & Plan:    See Problem List for  Assessment and Plan of chronic medical problems.

## 2024-04-26 ENCOUNTER — Ambulatory Visit (INDEPENDENT_AMBULATORY_CARE_PROVIDER_SITE_OTHER): Admitting: Internal Medicine

## 2024-04-26 ENCOUNTER — Ambulatory Visit
Admission: RE | Admit: 2024-04-26 | Discharge: 2024-04-26 | Disposition: A | Discharge status: Home / Self Care | Source: Ambulatory Visit | Attending: Vascular Surgery | Admitting: Vascular Surgery

## 2024-04-26 VITALS — BP 112/58 | HR 95 | Temp 98.4°F | Ht 64.0 in | Wt 150.0 lb

## 2024-04-26 DIAGNOSIS — K921 Melena: Secondary | ICD-10-CM | POA: Diagnosis not present

## 2024-04-26 DIAGNOSIS — I10 Essential (primary) hypertension: Secondary | ICD-10-CM

## 2024-04-26 DIAGNOSIS — E860 Dehydration: Secondary | ICD-10-CM | POA: Diagnosis not present

## 2024-04-26 DIAGNOSIS — I7143 Infrarenal abdominal aortic aneurysm, without rupture: Secondary | ICD-10-CM

## 2024-04-26 DIAGNOSIS — R197 Diarrhea, unspecified: Secondary | ICD-10-CM

## 2024-04-26 LAB — CBC WITH DIFFERENTIAL/PLATELET
Basophils Absolute: 0.1 10*3/uL (ref 0.0–0.1)
Basophils Relative: 0.6 % (ref 0.0–3.0)
Eosinophils Absolute: 0.4 10*3/uL (ref 0.0–0.7)
Eosinophils Relative: 2.9 % (ref 0.0–5.0)
HCT: 30.6 % — ABNORMAL LOW (ref 36.0–46.0)
Hemoglobin: 10.2 g/dL — ABNORMAL LOW (ref 12.0–15.0)
Lymphocytes Relative: 20.9 % (ref 12.0–46.0)
Lymphs Abs: 2.5 10*3/uL (ref 0.7–4.0)
MCHC: 33.3 g/dL (ref 30.0–36.0)
MCV: 89 fl (ref 78.0–100.0)
Monocytes Absolute: 1 10*3/uL (ref 0.1–1.0)
Monocytes Relative: 8.1 % (ref 3.0–12.0)
Neutro Abs: 8.2 10*3/uL — ABNORMAL HIGH (ref 1.4–7.7)
Neutrophils Relative %: 67.5 % (ref 43.0–77.0)
Platelets: 343 10*3/uL (ref 150.0–400.0)
RBC: 3.44 Mil/uL — ABNORMAL LOW (ref 3.87–5.11)
RDW: 13.4 % (ref 11.5–15.5)
WBC: 12.1 10*3/uL — ABNORMAL HIGH (ref 4.0–10.5)

## 2024-04-26 NOTE — Assessment & Plan Note (Signed)
 Acute Likely related to diarrhea She is pushing the fluids and advised her to continue to do this She has been able to control the diarrhea with Imodium and she will continue to do that.

## 2024-04-26 NOTE — Assessment & Plan Note (Signed)
 Acute Started just after she finished taking ciprofloxacin  for 10 days for UTI Concern for C. difficile versus other infectious cause PCR GI panel for stool ordered Hold off on treatment until we have the results Continue as needed Stress staying hydrated

## 2024-04-26 NOTE — Assessment & Plan Note (Signed)
 Chronic Blood pressure controlled Stressed good hydration given diarrhea No change in medications Continue labetalol  300 mg twice daily, losartan  100 mg daily Monitor Bp at home

## 2024-04-26 NOTE — Assessment & Plan Note (Signed)
 Acute Describes the stool as black She was initially taking Pepto-Bismol, but has not taking that for over a week and is only taking Imodium which does not discolor the stool Concern for GI bleed Check CBC

## 2024-04-27 ENCOUNTER — Ambulatory Visit: Payer: Self-pay | Admitting: Internal Medicine

## 2024-04-27 DIAGNOSIS — R197 Diarrhea, unspecified: Secondary | ICD-10-CM | POA: Diagnosis not present

## 2024-04-28 LAB — GI PROFILE, STOOL, PCR

## 2024-05-02 NOTE — Progress Notes (Unsigned)
 Patient name: Sheila Melton MRN: [id number] DOB: 08-Apr-1941 Sex: female  REASON FOR VISIT: 1 year follow-up for AAA  HPI: Sheila Melton is a 83 y.o. female with hx chronic back pain with spinal stimulator, HTN, HLD, stage IV CKD, DM that presents for 1 year follow-up of her AAA.  Her aneurysm has been followed by Dr. Oris.  She had previously been followed by Dr. Oris up to 5 cm and he recommended follow-up with me.  Her AAA last measured 5.4 cm on 03/17/23.   She presents for follow-up with a non-contrast CT scan today.    Still having chronic back pain that is unchanged..  Past Medical History:  Diagnosis Date   AAA (abdominal aortic aneurysm) (HCC)    BEING WATCHED   VVS. 4.2 cm infrarenal abdominal aortic aneurysm without rupture noted 11/08/2016   Arthritis    Cancer (HCC)    skin cancer removed from left shoulder  2017   CAP (community acquired pneumonia) 09/02/13-11/05/13    with SIRS   Depression    ?? from prednisone     not on pred now   Diabetes mellitus    type ii    long time ago.   Family history of anesthesia complication    PATIENTS MOM HAD TROUBLE WAKING UP    GERD (gastroesophageal reflux disease)    will take occasional tums   Heart murmur    still has.   Sees Dr. Pietro   History of kidney stones    has them at the present time   Hyperlipidemia    Hypertension    unspecified essential   Renal insufficiency    FSGS   Subclavian steal syndrome    LEFT SIDE   NO SIDE EFFECTS   Subclavian steal syndrome --  left    85% blocked   UTI (lower urinary tract infection) 08/26/13   Enterococcus and Escherichia coli both sensitive to nitrofurantoin     Past Surgical History:  Procedure Laterality Date   ABDOMINAL HYSTERECTOMY     BACK SURGERY     CATARACT EXTRACTION     OS   CYST EXCISION Right 05-23-13   Right index finger: cyst   DILATION AND CURETTAGE OF UTERUS     EYE SURGERY     g1 p1     SPINE SURGERY  Sept. 2013   VIDEO ASSISTED THORACOSCOPY  (VATS)/ LOBECTOMY Right 02/16/2017   Procedure: Right VIDEO ASSISTED THORACOSCOPY (VATS)/ Right Middle LOBECTOMY;  Surgeon: Elspeth JAYSON Millers, MD;  Location: [facility] OR;  Service: Thoracic;  Laterality: Right;    Family History  Problem Relation Age of Onset   Heart failure Mother    Heart disease Mother    Hypertension Mother    Other Mother        varicose veins   Deep vein thrombosis Mother    Varicose Veins Mother    Heart attack Mother    Hypertension Sister        X 2   Diabetes Sister    Hyperlipidemia Sister    Other Sister        varicose veins   Heart disease Father    Diabetes Father    Hyperlipidemia Father    Hypertension Father    Heart attack Father    Heart disease Brother        MI in late 28s   Hyperlipidemia Brother    Heart attack Brother    Coronary artery disease Other  Diabetes Other    Parkinsonism Brother    Heart failure Brother    Hypertension Daughter     SOCIAL HISTORY: Social History   Tobacco Use   Smoking status: Light Smoker    Current packs/day: 1.00    Average packs/day: 1 pack/day for 50.0 years (50.0 ttl pk-yrs)    Types: E-cigarettes, Cigarettes   Smokeless tobacco: Never   Tobacco comments:    e-cigs  Substance Use Topics   Alcohol use: No    Allergies  Allergen Reactions   Rocephin  [Ceftriaxone  Sodium In Dextrose ] Dermatitis and Rash    10/24 /14 blisters of palms & diffuse rash Because of a history of documented adverse serious drug reaction;Medi Alert bracelet  is recommended   Amoxicillin Rash    Has patient had a PCN reaction causing IMMEDIATE RASH, FACIAL/TONGUE/THROAT SWELLING, SOB, OR LIGHTHEADEDNESS WITH HYPOTENSION:  #  #  #  YES  #  #  #  Has patient had a PCN reaction causing severe rash involving mucus membranes or skin necrosis: No Has patient had a PCN reaction that required hospitalization No Has patient had a PCN reaction occurring within the last 10 years: No If all of the above answers are NO, then  may proceed with Cephalosporin use.    Captopril Cough   Simvastatin  Other (See Comments)    MYALGIAS BUTTOCKS CRAMP   Ciprofloxacin  Other (See Comments)    Sores in mouth    Semaglutide  Nausea And Vomiting   Adhesive [Tape] Rash   Latex Rash    Patient states she gets red and a rash when latex touches her.    Current Outpatient Medications  Medication Sig Dispense Refill   ascorbic Acid (VITAMIN C) 500 MG CPCR 1 cap PO QD     B Complex-C (SUPER B COMPLEX PO) Take 1 tablet by mouth daily.      Blood Glucose Monitoring Suppl (ONE TOUCH ULTRA 2) w/Device KIT Use as advised 1 each 0   calcium  carbonate (TUMS - DOSED IN MG ELEMENTAL CALCIUM ) 500 MG chewable tablet Chew 1 tablet by mouth daily as needed for indigestion or heartburn.     Cholecalciferol  (VITAMIN D3) 125 MCG (5000 UT) TABS Take 5,000 Units by mouth daily.     diazepam  (VALIUM ) 5 MG tablet TAKE ONE TABLET BY MOUTH AT BEDTIME AS NEEDED. 30 tablet 5   Dulaglutide  (TRULICITY ) 3 MG/0.5ML SOAJ Inject 3 mg as directed once a week. 2 mL 0   fexofenadine (ALLEGRA) 180 MG tablet Take 180 mg by mouth at bedtime.     FLUoxetine  (PROZAC ) 10 MG capsule TAKE ONE CAPSULE BY MOUTH DAILY 90 capsule 2   furosemide  (LASIX ) 40 MG tablet TAKE 1/2 TO 1 TABLET BY MOUTH DAILY AS NEEDED FOR FLUID 90 tablet 0   glipiZIDE  (GLUCOTROL ) 5 MG tablet TAKE ONE TABLET BY MOUTH EVERY MORNING and TAKE TWO TABLETS EVERY EVENING 270 tablet 0   glucose blood (ONETOUCH ULTRA) test strip CHECK BLOOD SUGAR TWICE DAILY 100 strip 3   KERENDIA 10 MG TABS Take 1 tablet by mouth daily.     labetalol  (NORMODYNE ) 300 MG tablet Take 1 tablet (300 mg total) by mouth 2 (two) times daily.     Liniments (SALONPAS  PAIN RELIEF PATCH EX) Apply 1 patch topically daily as needed (back pain).     losartan  (COZAAR ) 100 MG tablet Take 1 tablet (100 mg total) by mouth daily. 90 tablet 2   ondansetron  (ZOFRAN -ODT) 8 MG disintegrating tablet Take 1 tablet (  8 mg total) by mouth every 8  (eight) hours as needed for nausea. 20 tablet 0   OVER THE COUNTER MEDICATION CBD Gummies 2 each daily     oxyCODONE -acetaminophen  (PERCOCET/ROXICET) 5-325 MG tablet Take 1 tablet by mouth 3 (three) times daily as needed.     rosuvastatin  (CRESTOR ) 20 MG tablet TAKE ONE TABLET BY MOUTH DAILY 90 tablet 0   Triamcinolone  Acetonide (NASACORT  ALLERGY 24HR NA) Place 1 spray into the nose 2 (two) times daily as needed (congestion).     vitamin B-12 (CYANOCOBALAMIN) 1000 MCG tablet Take 1,000 mcg by mouth daily.     vitamin E 180 MG (400 UNITS) capsule Take 400 Units by mouth daily.     No current facility-administered medications for this visit.    REVIEW OF SYSTEMS:  [X]  denotes positive finding, [ ]  denotes negative finding Cardiac  Comments:  Chest pain or chest pressure:    Shortness of breath upon exertion:    Short of breath when lying flat:    Irregular heart rhythm:        Vascular    Pain in calf, thigh, or hip brought on by ambulation:    Pain in feet at night that wakes you up from your sleep:     Blood clot in your veins:    Leg swelling:         Pulmonary    Oxygen at home:    Productive cough:     Wheezing:         Neurologic    Sudden weakness in arms or legs:     Sudden numbness in arms or legs:     Sudden onset of difficulty speaking or slurred speech:    Temporary loss of vision in one eye:     Problems with dizziness:         Gastrointestinal    Blood in stool:     Vomited blood:         Genitourinary    Burning when urinating:     Blood in urine:        Psychiatric    Major depression:         Hematologic    Bleeding problems:    Problems with blood clotting too easily:        Skin    Rashes or ulcers:        Constitutional    Fever or chills:      PHYSICAL EXAM: There were no vitals filed for this visit.   GENERAL: The patient is a well-nourished female, in no acute distress. The vital signs are documented above. CARDIAC: There is a  regular rate and rhythm.  VASCULAR:  1+ palpable femoral pulses bilaterally No palpable pedal pulses PULMONARY: No respiratory distress. ABDOMEN: Soft and non-tender.  No pain with palpation of aneurysm. MUSCULOSKELETAL: There are no major deformities or cyanosis. NEUROLOGIC: No focal weakness or paresthesias are detected. PSYCHIATRIC: The patient has a normal affect.  DATA:   CT abd pelvis reviewed 04/26/24 reviewed and slight increase in AAA from 5.4 to 5.6 cm  Assessment/Plan:  83 year old female with multiple comorbidities including stage IV chronic kidney disease that presents for follow-up and continued surveillance of her 5.4 cm abdominal aortic aneurysm.  I discussed that her repeat CT scan shows a slight increase in aneurysm to 5.6 cm.  I again discussed that at the size of her AAA we would normally recommend repair as we recommend repair greater than 5 cm  in women given risk of rupture.  I have quoted her a 10 to 15% rupture risk on a yearly basis.  She has several complicating factors including very diseased calcified small iliac arteries as well as her chronic kidney disease stage IV that would make her high risk for complications including needing dialysis.  I discussed all this with the patient today and her husband.  She is wanting to seriously consider repair.  I do not think she is an open surgical candidate.  I think options are likely in AUI with femorofemoral bypass versus a traditional stent graft again limited by her severely calcified and small iliac arteries.  As a result, I recommended an angiogram with CO2 in the Cath Lab to further evaluate options for access and repair as her CT scans have been without contrast limiting evaluation.   Sheila DOROTHA Gaskins, MD [facility] Office: [phone]

## 2024-05-03 ENCOUNTER — Encounter: Payer: Self-pay | Admitting: Vascular Surgery

## 2024-05-03 ENCOUNTER — Ambulatory Visit: Attending: Vascular Surgery | Admitting: Vascular Surgery

## 2024-05-03 VITALS — BP 105/69 | HR 96 | Temp 98.0°F | Resp 20 | Ht 64.0 in | Wt 149.4 lb

## 2024-05-03 DIAGNOSIS — I7143 Infrarenal abdominal aortic aneurysm, without rupture: Secondary | ICD-10-CM | POA: Diagnosis not present

## 2024-05-04 ENCOUNTER — Other Ambulatory Visit: Payer: Self-pay

## 2024-05-04 DIAGNOSIS — I7143 Infrarenal abdominal aortic aneurysm, without rupture: Secondary | ICD-10-CM

## 2024-05-10 ENCOUNTER — Other Ambulatory Visit: Payer: Self-pay

## 2024-05-10 DIAGNOSIS — F1120 Opioid dependence, uncomplicated: Secondary | ICD-10-CM | POA: Diagnosis not present

## 2024-05-10 DIAGNOSIS — Z9689 Presence of other specified functional implants: Secondary | ICD-10-CM | POA: Diagnosis not present

## 2024-05-10 DIAGNOSIS — M5416 Radiculopathy, lumbar region: Secondary | ICD-10-CM | POA: Diagnosis not present

## 2024-05-17 ENCOUNTER — Other Ambulatory Visit: Payer: Self-pay | Admitting: Internal Medicine

## 2024-05-18 ENCOUNTER — Ambulatory Visit: Payer: Self-pay

## 2024-05-18 NOTE — Telephone Encounter (Signed)
 FYI Only or Action Required?: FYI only for provider.  Patient was last seen in primary care on 04/26/2024 by [name], MD.  Called Nurse Triage reporting Diarrhea.  Symptoms began several days ago.  Interventions attempted: OTC medications: Immodium.  Symptoms are: unchanged.  Triage Disposition: See Physician Within 24 Hours  Patient/caregiver understands and will follow disposition?: Yes   **Appt. Scheduled for 7/10**   Patient stated she was started on Cipro  for UTI x 10 days by her nephrologist. She is now having diarrhea. She is now wearing pull ups as a result of the side effects. She was advised by the nephrologist to take immodium for the symptoms. She was advised to follow up with PCP. A stool sample was sent in by PCP Dr, Geofm, no C-Diff detected. Intermittent abdominal pain noted. Patient is drinking plenty of fluids, however symptoms persist. She also mentioned receiving a low B/P last week, but it has since resolved.                              Copied from CRM [id number]. Topic: Clinical - Red Word Triage >> May 18, 2024  2:08 PM Macario HERO wrote: Red Word that prompted transfer to Nurse Triage: Patient is requesting a call back from Dr. Geofm nurse. Stated she has some things going on that she would like to share with her provider. Her Kidney doctor give her medication that caused her to have a bowel movement every day. Since she stopped taking the medication she's been having diarrhea since then and have to wear a diaper. -- Also stated her blood pressure was 89/56. Reason for Disposition  [1] MODERATE diarrhea (e.g., 4-6 times / day more than normal) AND [2] age > 70 years  Protocols used: Diarrhea-A-AH

## 2024-05-19 ENCOUNTER — Encounter: Payer: Self-pay | Admitting: Emergency Medicine

## 2024-05-19 ENCOUNTER — Ambulatory Visit (INDEPENDENT_AMBULATORY_CARE_PROVIDER_SITE_OTHER): Admitting: Emergency Medicine

## 2024-05-19 ENCOUNTER — Ambulatory Visit: Payer: Self-pay

## 2024-05-19 VITALS — BP 122/60 | HR 98 | Temp 98.4°F | Ht 64.0 in | Wt 148.0 lb

## 2024-05-19 DIAGNOSIS — I10 Essential (primary) hypertension: Secondary | ICD-10-CM

## 2024-05-19 DIAGNOSIS — N184 Chronic kidney disease, stage 4 (severe): Secondary | ICD-10-CM | POA: Diagnosis not present

## 2024-05-19 DIAGNOSIS — K529 Noninfective gastroenteritis and colitis, unspecified: Secondary | ICD-10-CM

## 2024-05-19 LAB — CBC WITH DIFFERENTIAL/PLATELET
Basophils Absolute: 0.1 K/uL (ref 0.0–0.1)
Basophils Relative: 0.6 % (ref 0.0–3.0)
Eosinophils Absolute: 0.5 K/uL (ref 0.0–0.7)
Eosinophils Relative: 4.3 % (ref 0.0–5.0)
HCT: 33.7 % — ABNORMAL LOW (ref 36.0–46.0)
Hemoglobin: 11.1 g/dL — ABNORMAL LOW (ref 12.0–15.0)
Lymphocytes Relative: 28.4 % (ref 12.0–46.0)
Lymphs Abs: 3.1 K/uL (ref 0.7–4.0)
MCHC: 32.9 g/dL (ref 30.0–36.0)
MCV: 89.5 fl (ref 78.0–100.0)
Monocytes Absolute: 1.1 K/uL — ABNORMAL HIGH (ref 0.1–1.0)
Monocytes Relative: 9.7 % (ref 3.0–12.0)
Neutro Abs: 6.2 K/uL (ref 1.4–7.7)
Neutrophils Relative %: 57 % (ref 43.0–77.0)
Platelets: 326 K/uL (ref 150.0–400.0)
RBC: 3.76 Mil/uL — ABNORMAL LOW (ref 3.87–5.11)
RDW: 13.7 % (ref 11.5–15.5)
WBC: 10.9 K/uL — ABNORMAL HIGH (ref 4.0–10.5)

## 2024-05-19 LAB — COMPREHENSIVE METABOLIC PANEL WITH GFR
ALT: 15 U/L (ref 0–35)
AST: 20 U/L (ref 0–37)
Albumin: 4.5 g/dL (ref 3.5–5.2)
Alkaline Phosphatase: 75 U/L (ref 39–117)
BUN: 34 mg/dL — ABNORMAL HIGH (ref 6–23)
CO2: 26 meq/L (ref 19–32)
Calcium: 9.9 mg/dL (ref 8.4–10.5)
Chloride: 101 meq/L (ref 96–112)
Creatinine, Ser: 3.09 mg/dL — ABNORMAL HIGH (ref 0.40–1.20)
GFR: 13.66 mL/min — CL (ref >60.00)
Glucose, Bld: 136 mg/dL — ABNORMAL HIGH (ref 70–99)
Potassium: 3.5 meq/L (ref 3.5–5.1)
Sodium: 138 meq/L (ref 135–145)
Total Bilirubin: 0.3 mg/dL (ref 0.2–1.2)
Total Protein: 7.2 g/dL (ref 6.0–8.3)

## 2024-05-19 MED ORDER — TRULICITY 1.5 MG/0.5ML ~~LOC~~ SOAJ: 1.5000 mg | SUBCUTANEOUS | 3 refills | Status: DC

## 2024-05-19 NOTE — Telephone Encounter (Signed)
 FYI Only or Action Required?: Action required by provider: lab or test result follow-up needed.  Patient was last seen in primary care on 05/19/2024 by Purcell Emil Schanz, MD.  Called Nurse Triage reporting No chief complaint on file..  Symptoms began today.  Interventions attempted: Nothing.  Symptoms are: stable.  Triage Disposition: No disposition on file. See note for critical lab results    -----------------------------------------------------------   Copied from CRM [id number]. Topic: Clinical - Red Word Triage >> May 19, 2024  5:12 PM Sheila Melton wrote: [facility] that prompted transfer to Nurse Triage: Sheila Melton reporting critical labs Answer Assessment - Initial Assessment Questions 1. REASON FOR CALL: What is the main reason for your call? or How can I best help you?  [facility] calling to report a critical lab.    Critical Lab:  GFR:  13.66   MD on call - MD Lynwood Molt paged appropriately and made aware @17 :30  Protocols used: Information Only Call - No Triage-A-AH

## 2024-05-19 NOTE — Assessment & Plan Note (Signed)
 BP Readings from Last 3 Encounters:  05/19/24 122/60  05/03/24 105/69  04/26/24 (!) 112/58  Well-controlled hypertension Continues losartan  100 mg daily and labetalol  300 mg twice a day

## 2024-05-19 NOTE — Assessment & Plan Note (Signed)
 Chronic Check CMP, CBC today Following with Dr. Tobie On Kerendia Sugars not ideally controlled

## 2024-05-19 NOTE — Progress Notes (Signed)
 Sheila Melton Clause 83 y.o.   Chief Complaint  Patient presents with   Diarrhea    Patient here for diarrhea for 6 weeks, and has not gotten better. Dr. Geofm did test her for Cdiff and did labs. She has been taking cepro and states this is the side affect.     HISTORY OF PRESENT ILLNESS: This is a 83 y.o. female complaining of chronic nonbloody diarrhea for the last 6 weeks. Was started on Cipro  500 mg for 10 days started on May 22 and finished June 2 Symptoms started soon after dose of Trulicity  was increased to 3 mg weekly Office visit on 04/27/2024.  Stool GI profile was positive for norovirus.  Negative for C. difficile. Has been taking Imodium.  Symptoms slowly improving.  Better over the past 4 to 5 days. Not vomiting.  Nonbloody diarrhea.  No episodes today however.  No fever.  Diarrhea  Pertinent negatives include no abdominal pain, chills, coughing, fever, headaches or vomiting.     Prior to Admission medications   Medication Sig Start Date End Date Taking? Authorizing Provider  ascorbic Acid (VITAMIN C) 500 MG CPCR 1 cap PO QD   Yes [provider]  B Complex-C (SUPER B COMPLEX PO) Take 1 tablet by mouth daily.    Yes [provider]  Blood Glucose Monitoring Suppl (ONE TOUCH ULTRA 2) w/Device KIT Use as advised 10/21/16  Yes Trixie File, MD  calcium  carbonate (TUMS - DOSED IN MG ELEMENTAL CALCIUM ) 500 MG chewable tablet Chew 1 tablet by mouth daily as needed for indigestion or heartburn.   Yes [provider]  Cholecalciferol  (VITAMIN D3) 125 MCG (5000 UT) TABS Take 5,000 Units by mouth daily.   Yes [provider]  diazepam  (VALIUM ) 5 MG tablet TAKE ONE TABLET BY MOUTH AT BEDTIME AS NEEDED. 01/27/24  Yes Burns, Glade PARAS, MD  fexofenadine (ALLEGRA) 180 MG tablet Take 180 mg by mouth at bedtime.   Yes [provider]  FLUoxetine  (PROZAC ) 10 MG capsule TAKE ONE CAPSULE BY MOUTH DAILY 12/08/23  Yes Burns, Glade PARAS, MD  furosemide   (LASIX ) 40 MG tablet TAKE 1/2 TO 1 TABLET BY MOUTH DAILY AS NEEDED FOR FLUID 03/07/24  Yes Burns, Glade PARAS, MD  glipiZIDE  (GLUCOTROL ) 5 MG tablet TAKE ONE TABLET BY MOUTH EVERY MORNING and TAKE TWO TABLETS EVERY EVENING 03/07/24  Yes Burns, Glade PARAS, MD  glucose blood (ONETOUCH ULTRA) test strip CHECK BLOOD SUGAR TWICE DAILY 03/17/23  Yes Burns, Glade PARAS, MD  KERENDIA 10 MG TABS Take 1 tablet by mouth daily. 05/14/21  Yes [provider]  labetalol  (NORMODYNE ) 300 MG tablet Take 1 tablet (300 mg total) by mouth 2 (two) times daily. 12/25/21  Yes Burns, Glade PARAS, MD  Liniments (SALONPAS  PAIN RELIEF PATCH EX) Apply 1 patch topically daily as needed (back pain).   Yes [provider]  losartan  (COZAAR ) 100 MG tablet Take 1 tablet (100 mg total) by mouth daily. 12/08/23  Yes Burns, Glade PARAS, MD  ondansetron  (ZOFRAN -ODT) 8 MG disintegrating tablet Take 1 tablet (8 mg total) by mouth every 8 (eight) hours as needed for nausea. 04/19/24  Yes Charlyn Sora, MD  OVER THE COUNTER MEDICATION CBD Gummies 2 each daily   Yes [provider]  oxyCODONE -acetaminophen  (PERCOCET/ROXICET) 5-325 MG tablet Take 1 tablet by mouth 3 (three) times daily as needed. 05/07/20  Yes [provider]  rosuvastatin  (CRESTOR ) 20 MG tablet TAKE ONE TABLET BY MOUTH DAILY 03/07/24  Yes Burns,  Glade PARAS, MD  Triamcinolone  Acetonide (NASACORT  ALLERGY 24HR NA) Place 1 spray into the nose 2 (two) times daily as needed (congestion).   Yes [provider]  TRULICITY  3 MG/0.5ML SOAJ Inject 3 mg as directed once a week. 05/17/24  Yes Burns, Glade PARAS, MD  vitamin B-12 (CYANOCOBALAMIN) 1000 MCG tablet Take 1,000 mcg by mouth daily.   Yes [provider]  vitamin E 180 MG (400 UNITS) capsule Take 400 Units by mouth daily.   Yes [provider]    Allergies  Allergen Reactions   Rocephin  [Ceftriaxone  Sodium In Dextrose ] Dermatitis and Rash    10/24 /14 blisters of palms & diffuse rash Because of a  history of documented adverse serious drug reaction;Medi Alert bracelet  is recommended   Amoxicillin Rash    Has patient had a PCN reaction causing IMMEDIATE RASH, FACIAL/TONGUE/THROAT SWELLING, SOB, OR LIGHTHEADEDNESS WITH HYPOTENSION:  #  #  #  YES  #  #  #  Has patient had a PCN reaction causing severe rash involving mucus membranes or skin necrosis: No Has patient had a PCN reaction that required hospitalization No Has patient had a PCN reaction occurring within the last 10 years: No If all of the above answers are NO, then may proceed with Cephalosporin use.    Captopril Cough   Simvastatin  Other (See Comments)    MYALGIAS BUTTOCKS CRAMP   Ciprofloxacin  Other (See Comments)    Sores in mouth    Semaglutide  Nausea And Vomiting   Adhesive [Tape] Rash   Latex Rash    Patient states she gets red and a rash when latex touches her.    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   CKD (chronic kidney disease) 05/21/2016   Sleep difficulties 01/18/2016   Diabetes mellitus (HCC) 10/23/2015   Numbness of toes-Right foot 06/26/2015   Nephrolithiasis 03/19/2015   AAA (abdominal aortic aneurysm) without rupture (HCC) 03/19/2015   Anxiety state 03/19/2015   Lumbosacral spondylosis without myelopathy 03/15/2015   Degeneration of lumbar intervertebral disc 01/11/2015   Skin cancer 01/09/2015   Bilateral renal cysts 09/16/2012   Lumbosacral stenosis with neurogenic claudication 07/26/2012   Degenerative  spondylolisthesis 07/26/2012   CARDIAC MURMUR, SYSTOLIC 02/03/2009   Mixed hyperlipidemia 12/30/2007   Tobacco use disorder 12/30/2007   Essential hypertension 12/30/2007   Subclavian steal syndrome 12/30/2007   CAROTID BRUIT 04/07/2007    Past Medical History:  Diagnosis Date   AAA (abdominal aortic aneurysm) (HCC)    BEING WATCHED   VVS. 4.2 cm infrarenal abdominal aortic aneurysm without rupture noted 11/08/2016   Arthritis    Cancer (HCC)    skin cancer removed from left shoulder  2017   CAP (community acquired pneumonia) 09/02/13-11/05/13    with SIRS   Depression    ?? from prednisone     not on pred now   Diabetes mellitus    type ii    long time ago.   Family history of anesthesia complication    PATIENTS MOM HAD TROUBLE  WAKING UP    GERD (gastroesophageal reflux disease)    will take occasional tums   Heart murmur    still has.   Sees Dr. Pietro   History of kidney stones    has them at the present time   Hyperlipidemia    Hypertension    unspecified essential   Renal insufficiency    FSGS   Subclavian steal syndrome    LEFT SIDE   NO SIDE EFFECTS   Subclavian steal syndrome --  left    85% blocked   UTI (lower urinary tract infection) 08/26/13   Enterococcus and Escherichia coli both sensitive to nitrofurantoin     Past Surgical History:  Procedure Laterality Date   ABDOMINAL HYSTERECTOMY     BACK SURGERY     CATARACT EXTRACTION     OS   CYST EXCISION Right 05-23-13   Right index finger: cyst   DILATION AND CURETTAGE OF UTERUS     EYE SURGERY     g1 p1     SPINE SURGERY  Sept. 2013   VIDEO ASSISTED THORACOSCOPY (VATS)/ LOBECTOMY Right 02/16/2017   Procedure: Right VIDEO ASSISTED THORACOSCOPY (VATS)/ Right Middle LOBECTOMY;  Surgeon: Elspeth Melton Millers, MD;  Location: [facility] OR;  Service: Thoracic;  Laterality: Right;    Social History   Socioeconomic History   Marital status: Married    Spouse name: Donn   Number of children: 1   Years of  education: Not on file   Highest education level: Some college, no degree  Occupational History   Not on file  Tobacco Use   Smoking status: Light Smoker    Current packs/day: 1.00    Average packs/day: 1 pack/day for 50.0 years (50.0 ttl pk-yrs)    Types: E-cigarettes, Cigarettes   Smokeless tobacco: Never   Tobacco comments:    e-cigs  Vaping Use   Vaping status: Every Day  Substance and Sexual Activity   Alcohol use: No   Drug use: Yes    Types: Hydrocodone    Sexual activity: Not on file  Other Topics Concern   Not on file  Social History Narrative   Has 3 caffeinated bev a day      Exercise: Sheila Melton      Lives with husband   Social Drivers of Corporate investment banker Strain: Low Risk  (03/21/2024)   Overall Financial Resource Strain (CARDIA)    Difficulty of Paying Living Expenses: Not very hard  Food Insecurity: No Food Insecurity (03/21/2024)   Hunger Vital Sign    Worried About Running Out of Food in the Last Year: Never true    Ran Out of Food in the Last Year: Never true  Transportation Needs: No Transportation Needs (03/21/2024)   PRAPARE - Administrator, Civil Service (Medical): No    Lack of Transportation (Non-Medical): No  Physical Activity: Inactive (03/21/2024)   Exercise Vital Sign    Days of Exercise per Week: 0 days    Minutes of Exercise per Session: 0 min  Stress: Stress Concern Present (03/21/2024)   [employer] of Occupational Health - Occupational Stress Questionnaire    Feeling of Stress : Rather much  Social Connections: Moderately Isolated (03/21/2024)   Social Connection and Isolation Panel    Frequency of Communication with Friends and Family: Twice a week    Frequency of Social Gatherings with Friends and Family: Once a week    Attends Religious Services: Never    Production manager of [employer]  or Organizations: No    Attends Banker Meetings: Never    Marital Status: Married  Catering manager Violence: Not At Risk  (02/29/2024)   Humiliation, Afraid, Rape, and Kick questionnaire    Fear of Current or Ex-Partner: No    Emotionally Abused: No    Physically Abused: No    Sexually Abused: No    Family History  Problem Relation Age of Onset   Heart failure Mother    Heart disease Mother    Hypertension Mother    Other Mother        varicose veins   Deep vein thrombosis Mother    Varicose Veins Mother    Heart attack Mother    Hypertension Sister        X 2   Diabetes Sister    Hyperlipidemia Sister    Other Sister        varicose veins   Heart disease Father    Diabetes Father    Hyperlipidemia Father    Hypertension Father    Heart attack Father    Heart disease Brother        MI in late 12s   Hyperlipidemia Brother    Heart attack Brother    Coronary artery disease Other    Diabetes Other    Parkinsonism Brother    Heart failure Brother    Hypertension Daughter      Review of Systems  Constitutional: Negative.  Negative for chills and fever.  HENT: Negative.  Negative for congestion and sore throat.   Respiratory: Negative.  Negative for cough and shortness of breath.   Cardiovascular: Negative.  Negative for chest pain and palpitations.  Gastrointestinal:  Positive for diarrhea. Negative for abdominal pain, nausea and vomiting.  Genitourinary: Negative.  Negative for dysuria and hematuria.  Musculoskeletal:  Positive for back pain.  Skin: Negative.  Negative for rash.  Neurological:  Negative for dizziness and headaches.  All other systems reviewed and are negative.   Today's Vitals   05/19/24 1428  BP: 122/60  Pulse: 98  Temp: 98.4 F (36.9 C)  TempSrc: Oral  SpO2: 98%  Weight: 148 lb (67.1 kg)  Height: 5' 4 (1.626 m)   Body mass index is 25.4 kg/m.   Physical Exam Vitals reviewed.  Constitutional:      Appearance: Normal appearance.  HENT:     Head: Normocephalic.     Mouth/Throat:     Mouth: Mucous membranes are moist.     Pharynx: Oropharynx is  clear.  Eyes:     Extraocular Movements: Extraocular movements intact.     Pupils: Pupils are equal, round, and reactive to light.  Cardiovascular:     Rate and Rhythm: Normal rate and regular rhythm.     Pulses: Normal pulses.     Heart sounds: Normal heart sounds.  Pulmonary:     Effort: Pulmonary effort is normal.     Breath sounds: Normal breath sounds.  Abdominal:     Palpations: Abdomen is soft.     Tenderness: There is no abdominal tenderness.  Musculoskeletal:     Cervical back: No tenderness.  Lymphadenopathy:     Cervical: No cervical adenopathy.  Skin:    General: Skin is warm and dry.     Capillary Refill: Capillary refill takes less than 2 seconds.  Neurological:     General: No focal deficit present.     Mental Status: She is alert and oriented to person, place, and time.  Psychiatric:        Mood and Affect: Mood normal.        Behavior: Behavior normal.      ASSESSMENT & PLAN: A total of 42 minutes was spent with the patient and counseling/coordination of care regarding preparing for this visit, review of most recent office visit notes, review of multiple chronic medical conditions under management, review of all medications, differential diagnosis of chronic diarrhea, symptom management, prognosis, ED precautions, documentation and need for follow-up with PCP next week.  Problem List Items Addressed This Visit       Cardiovascular and Mediastinum   Essential hypertension   BP Readings from Last 3 Encounters:  05/19/24 122/60  05/03/24 105/69  04/26/24 (!) 112/58  Well-controlled hypertension Continues losartan  100 mg daily and labetalol  300 mg twice a day         Digestive   Chronic diarrhea - Primary   Clinically stable.  No red flag signs or symptoms. Multifactorial.  Differential diagnosis discussed Fairly recently took Cipro  for 10 days Recent GI stool profile only positive for norovirus Negative for C. Difficile Imodium has been helping.   Able to stay well-hydrated. Symptoms started shortly after having dose of Trulicity  increased from 1.5 to 3 mg weekly. GLP-1 agonist induced diarrhea most likely. Recommend to stay off GLP-1 agonist for 2 weeks and reintroduce at a lower dose, 1.5 mg weekly.  Patient wants me to send new prescription to pharmacy of record today. Recommend to repeat blood work and GI stool profile today Only take Imodium as needed and follow-up with PCP next week or as soon as available.      Relevant Orders   GI Profile, Stool, PCR   Comprehensive metabolic panel with GFR   CBC with Differential/Platelet     Genitourinary   CKD (chronic kidney disease)   Chronic Check CMP, CBC today Following with Dr. Tobie On Leonore Sugars not ideally controlled       Patient Instructions  Decrease weekly dose of Trulicity  to 1.5 mg after staying off it for 2 weeks Take Imodium only as needed Blood work and stool profile today Follow-up with your PCP within the next couple weeks  Diarrhea, Adult Diarrhea is when you pass loose and sometimes watery poop (stool) often. Diarrhea can make you feel weak and cause you to lose water in your body (get dehydrated). Losing water in your body can cause you to: Feel tired and thirsty. Have a dry mouth. Go pee (urinate) less often. Diarrhea often lasts 2-3 days. It can last longer if it is a sign of something more serious. Be sure to treat your diarrhea as told by your doctor. Follow these instructions at home: Eating and drinking     Follow these instructions as told by your doctor: Take an ORS (oral rehydration solution). This is a drink that helps you replace fluids and minerals your body lost. It is sold at pharmacies and stores. Drink enough fluid to keep your pee (urine) pale yellow. Drink fluids such as: Water. You can also get fluids by sucking on ice chips. Diluted fruit juice. Low-calorie sports drinks. Milk. Avoid drinking fluids that have a lot of  sugar or caffeine in them. These include soda, energy drinks, and regular sports drinks. Avoid alcohol. Eat bland, easy-to-digest foods in small amounts as you are able. These foods include: Bananas. Applesauce. Rice. Low-fat (lean) meats. Toast. Crackers. Avoid spicy or fatty foods.  Medicines Take over-the-counter and prescription medicines only as told  by your doctor. If you were prescribed antibiotics, take them as told by your doctor. Do not stop taking them even if you start to feel better. General instructions  Wash your hands often using soap and water for 20 seconds. If soap and water are not available, use hand sanitizer. Others in your home should wash their hands as well. Wash your hands: After using the toilet or changing a diaper. Before preparing, cooking, or serving food. While caring for a sick person. While visiting someone in a hospital. Rest at home while you get better. Take a warm bath to help with any burning or pain from having diarrhea. Watch your condition for any changes. Contact a doctor if: You have a fever. Your diarrhea gets worse. You have new symptoms. You vomit every time you eat or drink. You feel light-headed, dizzy, or you have a headache. You have muscle cramps. You have signs of losing too much water in your body, such as: Dark pee, very little pee, or no pee. Cracked lips. Dry mouth. Sunken eyes. Sleepiness. Weakness. You have bloody or black poop or poop that looks like tar. You have very bad pain, cramping, or bloating in your belly (abdomen). Your skin feels cold and clammy. You feel confused. Get help right away if: You have chest pain. Your heart is beating very quickly. You have trouble breathing or you are breathing very quickly. You feel very weak or you faint. These symptoms may be an emergency. Get help right away. Call 911. Do not wait to see if the symptoms will go away. Do not drive yourself to the hospital. This  information is not intended to replace advice given to you by your health care provider. Make sure you discuss any questions you have with your health care provider. Document Revised: 04/15/2022 Document Reviewed: 04/15/2022 Elsevier Patient Education  2024 Elsevier Inc.    Emil Schaumann, MD [facility]

## 2024-05-19 NOTE — Patient Instructions (Signed)
 Decrease weekly dose of Trulicity  to 1.5 mg after staying off it for 2 weeks Take Imodium only as needed Blood work and stool profile today Follow-up with your PCP within the next couple weeks  Diarrhea, Adult Diarrhea is when you pass loose and sometimes watery poop (stool) often. Diarrhea can make you feel weak and cause you to lose water in your body (get dehydrated). Losing water in your body can cause you to: Feel tired and thirsty. Have a dry mouth. Go pee (urinate) less often. Diarrhea often lasts 2-3 days. It can last longer if it is a sign of something more serious. Be sure to treat your diarrhea as told by your doctor. Follow these instructions at home: Eating and drinking     Follow these instructions as told by your doctor: Take an ORS (oral rehydration solution). This is a drink that helps you replace fluids and minerals your body lost. It is sold at pharmacies and stores. Drink enough fluid to keep your pee (urine) pale yellow. Drink fluids such as: Water. You can also get fluids by sucking on ice chips. Diluted fruit juice. Low-calorie sports drinks. Milk. Avoid drinking fluids that have a lot of sugar or caffeine in them. These include soda, energy drinks, and regular sports drinks. Avoid alcohol. Eat bland, easy-to-digest foods in small amounts as you are able. These foods include: Bananas. Applesauce. Rice. Low-fat (lean) meats. Toast. Crackers. Avoid spicy or fatty foods.  Medicines Take over-the-counter and prescription medicines only as told by your doctor. If you were prescribed antibiotics, take them as told by your doctor. Do not stop taking them even if you start to feel better. General instructions  Wash your hands often using soap and water for 20 seconds. If soap and water are not available, use hand sanitizer. Others in your home should wash their hands as well. Wash your hands: After using the toilet or changing a diaper. Before preparing,  cooking, or serving food. While caring for a sick person. While visiting someone in a hospital. Rest at home while you get better. Take a warm bath to help with any burning or pain from having diarrhea. Watch your condition for any changes. Contact a doctor if: You have a fever. Your diarrhea gets worse. You have new symptoms. You vomit every time you eat or drink. You feel light-headed, dizzy, or you have a headache. You have muscle cramps. You have signs of losing too much water in your body, such as: Dark pee, very little pee, or no pee. Cracked lips. Dry mouth. Sunken eyes. Sleepiness. Weakness. You have bloody or black poop or poop that looks like tar. You have very bad pain, cramping, or bloating in your belly (abdomen). Your skin feels cold and clammy. You feel confused. Get help right away if: You have chest pain. Your heart is beating very quickly. You have trouble breathing or you are breathing very quickly. You feel very weak or you faint. These symptoms may be an emergency. Get help right away. Call 911. Do not wait to see if the symptoms will go away. Do not drive yourself to the hospital. This information is not intended to replace advice given to you by your health care provider. Make sure you discuss any questions you have with your health care provider. Document Revised: 04/15/2022 Document Reviewed: 04/15/2022 Elsevier Patient Education  2024 [employer].

## 2024-05-19 NOTE — Assessment & Plan Note (Signed)
 Clinically stable.  No red flag signs or symptoms. Multifactorial.  Differential diagnosis discussed Fairly recently took Cipro  for 10 days Recent GI stool profile only positive for norovirus Negative for C. Difficile Imodium has been helping.  Able to stay well-hydrated. Symptoms started shortly after having dose of Trulicity  increased from 1.5 to 3 mg weekly. GLP-1 agonist induced diarrhea most likely. Recommend to stay off GLP-1 agonist for 2 weeks and reintroduce at a lower dose, 1.5 mg weekly.  Patient wants me to send new prescription to pharmacy of record today. Recommend to repeat blood work and GI stool profile today Only take Imodium as needed and follow-up with PCP next week or as soon as available.

## 2024-05-20 ENCOUNTER — Ambulatory Visit: Payer: Self-pay | Admitting: Emergency Medicine

## 2024-05-25 DIAGNOSIS — K529 Noninfective gastroenteritis and colitis, unspecified: Secondary | ICD-10-CM | POA: Diagnosis not present

## 2024-05-26 LAB — GI PROFILE, STOOL, PCR

## 2024-05-27 ENCOUNTER — Other Ambulatory Visit: Payer: Self-pay

## 2024-06-02 ENCOUNTER — Other Ambulatory Visit: Payer: Self-pay

## 2024-06-04 ENCOUNTER — Other Ambulatory Visit: Payer: Self-pay | Admitting: Internal Medicine

## 2024-06-09 ENCOUNTER — Other Ambulatory Visit: Payer: Self-pay

## 2024-06-10 ENCOUNTER — Other Ambulatory Visit: Payer: Self-pay

## 2024-06-14 ENCOUNTER — Other Ambulatory Visit (HOSPITAL_COMMUNITY): Payer: Self-pay

## 2024-06-16 ENCOUNTER — Other Ambulatory Visit: Payer: Self-pay

## 2024-07-01 ENCOUNTER — Other Ambulatory Visit: Payer: Self-pay

## 2024-07-18 DIAGNOSIS — N184 Chronic kidney disease, stage 4 (severe): Secondary | ICD-10-CM | POA: Diagnosis not present

## 2024-07-24 ENCOUNTER — Encounter: Payer: Self-pay | Admitting: Internal Medicine

## 2024-07-24 NOTE — Progress Notes (Signed)
 Subjective:    Patient ID: Sheila Melton, female    DOB: 1941/10/07, 83 y.o.   MRN: [id number]     HPI Lorina is here for follow up of her chronic medical problems.    Medications and allergies reviewed with patient and updated if appropriate.  Current Outpatient Medications on File Prior to Visit  Medication Sig Dispense Refill  . ascorbic Acid (VITAMIN C) 500 MG CPCR 1 cap PO QD    . B Complex-C (SUPER B COMPLEX PO) Take 1 tablet by mouth daily.     . Blood Glucose Monitoring Suppl (ONE TOUCH ULTRA 2) w/Device KIT Use as advised 1 each 0  . calcium  carbonate (TUMS - DOSED IN MG ELEMENTAL CALCIUM ) 500 MG chewable tablet Chew 1 tablet by mouth daily as needed for indigestion or heartburn.    . Cholecalciferol  (VITAMIN D3) 125 MCG (5000 UT) TABS Take 5,000 Units by mouth daily.    . fexofenadine (ALLEGRA) 180 MG tablet Take 180 mg by mouth at bedtime.    SABRA glucose blood (ONETOUCH ULTRA) test strip CHECK BLOOD SUGAR TWICE DAILY 100 strip 3  . KERENDIA 10 MG TABS Take 1 tablet by mouth daily.    . labetalol  (NORMODYNE ) 300 MG tablet Take 1 tablet (300 mg total) by mouth 2 (two) times daily.    . Liniments (SALONPAS  PAIN RELIEF PATCH EX) Apply 1 patch topically daily as needed (back pain).    . ondansetron  (ZOFRAN -ODT) 8 MG disintegrating tablet Take 1 tablet (8 mg total) by mouth every 8 (eight) hours as needed for nausea. 20 tablet 0  . OVER THE COUNTER MEDICATION CBD Gummies 2 each daily    . oxyCODONE -acetaminophen  (PERCOCET/ROXICET) 5-325 MG tablet Take 1 tablet by mouth 3 (three) times daily as needed.    . Triamcinolone  Acetonide (NASACORT  ALLERGY 24HR NA) Place 1 spray into the nose 2 (two) times daily as needed (congestion).    . vitamin B-12 (CYANOCOBALAMIN) 1000 MCG tablet Take 1,000 mcg by mouth daily.    . vitamin E 180 MG (400 UNITS) capsule Take 400 Units by mouth daily.     No current facility-administered medications on file prior to visit.     Review of  Systems     Objective:  There were no vitals filed for this visit. BP Readings from Last 3 Encounters:  05/19/24 122/60  05/03/24 105/69  04/26/24 (!) 112/58   Wt Readings from Last 3 Encounters:  05/19/24 148 lb (67.1 kg)  05/03/24 149 lb 6.4 oz (67.8 kg)  04/26/24 150 lb (68 kg)   There is no height or weight on file to calculate BMI.    Physical Exam     Lab Results  Component Value Date   WBC 10.9 (H) 05/19/2024   HGB 11.1 (L) 05/19/2024   HCT 33.7 (L) 05/19/2024   PLT 326.0 05/19/2024   GLUCOSE 136 (H) 05/19/2024   CHOL 133 03/22/2024   TRIG 267.0 (H) 03/22/2024   HDL 41.60 03/22/2024   LDLDIRECT 47.0 03/19/2023   LDLCALC 38 03/22/2024   ALT 15 05/19/2024   AST 20 05/19/2024   NA 138 05/19/2024   K 3.5 05/19/2024   CL 101 05/19/2024   CREATININE 3.09 (H) 05/19/2024   BUN 34 (H) 05/19/2024   CO2 26 05/19/2024   TSH 3.19 09/23/2023   INR 0.95 02/12/2017   HGBA1C 7.0 (H) 03/22/2024   MICROALBUR 5.9 (H) 07/11/2009     Assessment & Plan:  See Problem List for Assessment and Plan of chronic medical problems.    This encounter was created in error - please disregard.

## 2024-07-24 NOTE — Patient Instructions (Addendum)
      Blood work was ordered.       Medications changes include :   None    A referral was ordered and someone will call you to schedule an appointment.     Return in about 6 months (around 01/22/2025) for Physical Exam.

## 2024-07-25 ENCOUNTER — Encounter: Admitting: Internal Medicine

## 2024-07-25 DIAGNOSIS — N185 Chronic kidney disease, stage 5: Secondary | ICD-10-CM

## 2024-07-25 DIAGNOSIS — F3289 Other specified depressive episodes: Secondary | ICD-10-CM

## 2024-07-25 DIAGNOSIS — M4807 Spinal stenosis, lumbosacral region: Secondary | ICD-10-CM

## 2024-07-25 DIAGNOSIS — G479 Sleep disorder, unspecified: Secondary | ICD-10-CM

## 2024-07-25 DIAGNOSIS — E1122 Type 2 diabetes mellitus with diabetic chronic kidney disease: Secondary | ICD-10-CM

## 2024-07-25 DIAGNOSIS — E1169 Type 2 diabetes mellitus with other specified complication: Secondary | ICD-10-CM

## 2024-07-25 DIAGNOSIS — I152 Hypertension secondary to endocrine disorders: Secondary | ICD-10-CM

## 2024-07-25 DIAGNOSIS — M810 Age-related osteoporosis without current pathological fracture: Secondary | ICD-10-CM

## 2024-07-25 DIAGNOSIS — F411 Generalized anxiety disorder: Secondary | ICD-10-CM

## 2024-07-25 NOTE — Assessment & Plan Note (Signed)
 Chronic Controlled, Stable Continue fluoxetine 10 mg daily

## 2024-07-25 NOTE — Assessment & Plan Note (Signed)
 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 Tobie On Kerendia Sugars not ideally controlled

## 2024-07-25 NOTE — Assessment & Plan Note (Addendum)
 Chronic Associated with chronic kidney disease, hyperlipidemia  Lab Results  Component Value Date   HGBA1C 7.0 (H) 03/22/2024   Sugars not ideally controlled Check A1c, urine albumin /creatinine ratio Continue Trulicity  to 1.5 mg weekly,  Continue glipizide  5 mg q am, 10 mg q pm Stressed diabetic diet

## 2024-07-25 NOTE — Assessment & Plan Note (Signed)
 Chronic Blood pressure controlled Stressed good hydration given diarrhea No change in medications Continue labetalol  300 mg twice daily, losartan  100 mg daily Monitor Bp at home CMP, CBC

## 2024-07-25 NOTE — Assessment & Plan Note (Signed)
 Chronic DEXA up-to-date Not currently on any medication due to severe CKD

## 2024-07-25 NOTE — Assessment & Plan Note (Signed)
 Chronic Lipid panel, CMP, TSH Continue Crestor 20 mg daily

## 2024-07-25 NOTE — Assessment & Plan Note (Signed)
 Chronic Controlled, Stable Continue fluoxetine  10 mg daily, Valium  5 mg at bedtime as needed

## 2024-07-25 NOTE — Assessment & Plan Note (Signed)
Chronic °Controlled, Stable °Continue diazepam 5 mg nightly °

## 2024-07-25 NOTE — Assessment & Plan Note (Signed)
 Chronic Unable to have surgery Taking oxycodone  5-325 mg 1 tab 3 times daily as needed Has spinal stimulator

## 2024-07-27 DIAGNOSIS — N184 Chronic kidney disease, stage 4 (severe): Secondary | ICD-10-CM | POA: Diagnosis not present

## 2024-07-27 DIAGNOSIS — I129 Hypertensive chronic kidney disease with stage 1 through stage 4 chronic kidney disease, or unspecified chronic kidney disease: Secondary | ICD-10-CM | POA: Diagnosis not present

## 2024-07-27 DIAGNOSIS — N2581 Secondary hyperparathyroidism of renal origin: Secondary | ICD-10-CM | POA: Diagnosis not present

## 2024-07-27 DIAGNOSIS — D631 Anemia in chronic kidney disease: Secondary | ICD-10-CM | POA: Diagnosis not present

## 2024-08-05 ENCOUNTER — Ambulatory Visit (INDEPENDENT_AMBULATORY_CARE_PROVIDER_SITE_OTHER)

## 2024-08-05 DIAGNOSIS — Z23 Encounter for immunization: Secondary | ICD-10-CM | POA: Diagnosis not present

## 2024-08-05 NOTE — Progress Notes (Signed)
 Pt requested high dose flu vaccine. Administered vaccine. Pt tolerated well.  Pt reported she that latex is listed as an allergy on her allergy list but does not know how that got on her allergy list. She reports she is not allergic to latex. She reports she has had the flu vaccine every year and never had a prior reaction.   This nurse provided pt flu vaccine last year also. No reaction was observed.

## 2024-08-08 DIAGNOSIS — F1120 Opioid dependence, uncomplicated: Secondary | ICD-10-CM | POA: Diagnosis not present

## 2024-08-08 DIAGNOSIS — M5416 Radiculopathy, lumbar region: Secondary | ICD-10-CM | POA: Diagnosis not present

## 2024-08-08 DIAGNOSIS — Z9689 Presence of other specified functional implants: Secondary | ICD-10-CM | POA: Diagnosis not present

## 2024-08-16 ENCOUNTER — Other Ambulatory Visit: Payer: Self-pay

## 2024-08-22 ENCOUNTER — Other Ambulatory Visit: Payer: Self-pay | Admitting: Internal Medicine

## 2024-09-02 ENCOUNTER — Other Ambulatory Visit: Payer: Self-pay | Admitting: Internal Medicine

## 2024-09-08 ENCOUNTER — Other Ambulatory Visit: Payer: Self-pay

## 2024-09-22 ENCOUNTER — Other Ambulatory Visit: Payer: Self-pay | Admitting: Emergency Medicine

## 2024-09-22 ENCOUNTER — Other Ambulatory Visit: Payer: Self-pay | Admitting: Internal Medicine

## 2024-09-26 ENCOUNTER — Other Ambulatory Visit: Payer: Self-pay | Admitting: Internal Medicine

## 2024-09-29 ENCOUNTER — Other Ambulatory Visit: Payer: Self-pay

## 2024-10-05 ENCOUNTER — Other Ambulatory Visit: Payer: Self-pay

## 2024-10-11 ENCOUNTER — Ambulatory Visit: Attending: Cardiovascular Disease | Admitting: Cardiovascular Disease

## 2024-10-11 ENCOUNTER — Encounter: Payer: Self-pay | Admitting: Cardiovascular Disease

## 2024-10-11 VITALS — BP 136/64 | HR 88 | Ht 64.0 in | Wt 146.0 lb

## 2024-10-11 DIAGNOSIS — E785 Hyperlipidemia, unspecified: Secondary | ICD-10-CM

## 2024-10-11 DIAGNOSIS — I714 Abdominal aortic aneurysm, without rupture, unspecified: Secondary | ICD-10-CM

## 2024-10-11 DIAGNOSIS — I771 Stricture of artery: Secondary | ICD-10-CM

## 2024-10-11 DIAGNOSIS — Z720 Tobacco use: Secondary | ICD-10-CM | POA: Diagnosis not present

## 2024-10-11 DIAGNOSIS — I10 Essential (primary) hypertension: Secondary | ICD-10-CM

## 2024-10-11 NOTE — Progress Notes (Signed)
 Cardiology Office Note   Date:  10/11/2024   ID:  Sheila Melton, DOB 04-03-1941, MRN [id number]  PCP:  Geofm Sheila PARAS, MD  Cardiologist: [name]  No chief complaint on file.    History of Present Illness: Sheila Melton is a 83 y.o. female who is here today for follow-up visit regarding left subclavian artery stenosis.   The patient has known history of abdominal aortic aneurysm followed by vascular surgery, hyperlipidemia, chronic kidney disease , tobacco use and diabetes mellitus.   Echocardiogram July 2009 showed normal left ventricular function and mild mitral regurgitation.  She has known history of asymptomatic left subclavian artery stenosis for many years that was left to be treated medically due to lack of symptoms.   She has known large abdominal aortic aneurysm followed by vascular surgery with difficult management options.   She had an echocardiogram done in December 2022 which showed normal LV systolic function with no significant valvular abnormalities and small pericardial effusion. Carotid Doppler in December 2022 showed mild nonobstructive disease with known left subclavian stenosis.  She has been doing very well with no chest pain, shortness of breath or palpitations.  No left arm claudication.  Renal function continues to worsen with most recent GFR of 14.   Past Medical History:  Diagnosis Date   AAA (abdominal aortic aneurysm)    BEING WATCHED   VVS. 4.2 cm infrarenal abdominal aortic aneurysm without rupture noted 11/08/2016   Arthritis    Cancer (HCC)    skin cancer removed from left shoulder  2017   CAP (community acquired pneumonia) 09/02/13-11/05/13    with SIRS   Depression    ?? from prednisone     not on pred now   Diabetes mellitus    type ii    long time ago.   Family history of anesthesia complication    PATIENTS MOM HAD TROUBLE WAKING UP    GERD (gastroesophageal reflux disease)    will take occasional tums   Heart murmur    still has.    Sees Dr. Pietro   History of kidney stones    has them at the present time   Hyperlipidemia    Hypertension    unspecified essential   Renal insufficiency    FSGS   Subclavian steal syndrome    LEFT SIDE   NO SIDE EFFECTS   Subclavian steal syndrome --  left    85% blocked   UTI (lower urinary tract infection) 08/26/13   Enterococcus and Escherichia coli both sensitive to nitrofurantoin     Past Surgical History:  Procedure Laterality Date   ABDOMINAL HYSTERECTOMY     BACK SURGERY     CATARACT EXTRACTION     OS   CYST EXCISION Right 05-23-13   Right index finger: cyst   DILATION AND CURETTAGE OF UTERUS     EYE SURGERY     g1 p1     SPINE SURGERY  Sept. 2013   VIDEO ASSISTED THORACOSCOPY (VATS)/ LOBECTOMY Right 02/16/2017   Procedure: Right VIDEO ASSISTED THORACOSCOPY (VATS)/ Right Middle LOBECTOMY;  Surgeon: Elspeth JAYSON Millers, MD;  Location: [facility] OR;  Service: Thoracic;  Laterality: Right;     Current Outpatient Medications  Medication Sig Dispense Refill   ascorbic Acid (VITAMIN C) 500 MG CPCR 1 cap PO QD     B Complex-C (SUPER B COMPLEX PO) Take 1 tablet by mouth daily.      Blood Glucose Monitoring Suppl (ONE TOUCH ULTRA 2)  w/Device KIT Use as advised 1 each 0   calcium  carbonate (TUMS - DOSED IN MG ELEMENTAL CALCIUM ) 500 MG chewable tablet Chew 1 tablet by mouth daily as needed for indigestion or heartburn.     Cholecalciferol  (VITAMIN D3) 125 MCG (5000 UT) TABS Take 5,000 Units by mouth daily.     diazepam  (VALIUM ) 5 MG tablet TAKE ONE TABLET BY MOUTH AT BEDTIME AS NEEDED. 30 tablet 0   Dulaglutide  (TRULICITY ) 1.5 MG/0.5ML SOAJ Inject 1.5 mg (0.5ml)into the skin once a week. 2 mL 0   fexofenadine (ALLEGRA) 180 MG tablet Take 180 mg by mouth at bedtime.     FLUoxetine  (PROZAC ) 10 MG capsule TAKE ONE CAPSULE BY MOUTH DAILY 90 capsule 0   furosemide  (LASIX ) 40 MG tablet TAKE 1/2 TO 1 TABLET BY MOUTH DAILY AS NEEDED FOR FLUID 90 tablet 0   glipiZIDE  (GLUCOTROL ) 5 MG  tablet TAKE ONE TABLET BY MOUTH EVERY MORNING and TAKE TWO TABLETS EVERY EVENING 270 tablet 0   glucose blood (ONETOUCH ULTRA) test strip CHECK BLOOD SUGAR TWICE DAILY 100 strip 3   KERENDIA 10 MG TABS Take 1 tablet by mouth daily.     labetalol  (NORMODYNE ) 300 MG tablet Take 1 tablet (300 mg total) by mouth 2 (two) times daily.     Liniments (SALONPAS  PAIN RELIEF PATCH EX) Apply 1 patch topically daily as needed (back pain).     losartan  (COZAAR ) 100 MG tablet Take 1 tablet (100 mg total) by mouth daily. 90 tablet 2   oxyCODONE -acetaminophen  (PERCOCET/ROXICET) 5-325 MG tablet Take 1 tablet by mouth 3 (three) times daily as needed.     rosuvastatin  (CRESTOR ) 20 MG tablet TAKE ONE TABLET BY MOUTH DAILY 90 tablet 0   Triamcinolone  Acetonide (NASACORT  ALLERGY 24HR NA) Place 1 spray into the nose 2 (two) times daily as needed (congestion).     vitamin B-12 (CYANOCOBALAMIN) 1000 MCG tablet Take 1,000 mcg by mouth daily.     vitamin E 180 MG (400 UNITS) capsule Take 400 Units by mouth daily.     ondansetron  (ZOFRAN -ODT) 8 MG disintegrating tablet Take 1 tablet (8 mg total) by mouth every 8 (eight) hours as needed for nausea. (Patient not taking: Reported on 10/11/2024) 20 tablet 0   OVER THE COUNTER MEDICATION CBD Gummies 2 each daily (Patient not taking: Reported on 10/11/2024)     No current facility-administered medications for this visit.    Allergies:   Rocephin  [ceftriaxone  sodium in dextrose ], Amoxicillin, Captopril, Simvastatin , Ciprofloxacin , Semaglutide , Adhesive [tape], and Latex    Social History:  The patient  reports that she has been smoking e-cigarettes and cigarettes. She has a 50 pack-year smoking history. She has never used smokeless tobacco. She reports current drug use. Drug: Hydrocodone . She reports that she does not drink alcohol.   Family History:  The patient's family history includes Coronary artery disease in an other family member; Deep vein thrombosis in her mother;  Diabetes in her father, sister, and another family member; Heart attack in her brother, father, and mother; Heart disease in her brother, father, and mother; Heart failure in her brother and mother; Hyperlipidemia in her brother, father, and sister; Hypertension in her daughter, father, mother, and sister; Other in her mother and sister; Parkinsonism in her brother; Varicose Veins in her mother.    ROS:  Please see the history of present illness.   Otherwise, review of systems are positive for none.   All other systems are reviewed and negative.  PHYSICAL EXAM: VS:  BP 136/64   Pulse 88   Ht 5' 4 (1.626 m)   Wt 146 lb (66.2 kg)   SpO2 98%   BMI 25.06 kg/m  , BMI Body mass index is 25.06 kg/m. GEN: Well nourished, well developed, in no acute distress  HEENT: normal  Neck: no JVD, carotid bruits, or masses. She does have a bruit at the left upper chest area Cardiac: RRR; rubs, or gallops,no edema . There is 2/ 6 systolic ejection murmur in the aortic area Respiratory:  clear to auscultation bilaterally, normal work of breathing GI: soft, nontender, nondistended, + BS MS: no deformity or atrophy  Skin: warm and dry, no rash Neuro:  Strength and sensation are intact Psych: euthymic mood, full affect Vascular: Radial pulses normal on the right side and mildly diminished on the left side.  EKG:  EKG is ordered today.  Normal sinus rhythm Normal ECG When compared with ECG of 29-Dec-2023 11:43, No significant change was found   Recent Labs: 04/19/2024: Magnesium 2.0 05/19/2024: ALT 15; BUN 34; Creatinine, Ser 3.09; Hemoglobin 11.1; Platelets 326.0; Potassium 3.5; Sodium 138    Lipid Panel    Component Value Date/Time   CHOL 133 03/22/2024 1500   TRIG 267.0 (H) 03/22/2024 1500   TRIG 263 (HH) 09/15/2006 1116   HDL 41.60 03/22/2024 1500   CHOLHDL 3 03/22/2024 1500   VLDL 53.4 (H) 03/22/2024 1500   LDLCALC 38 03/22/2024 1500   LDLCALC 70 05/21/2020 1437   LDLDIRECT 47.0  03/19/2023 1451      Wt Readings from Last 3 Encounters:  10/11/24 146 lb (66.2 kg)  05/19/24 148 lb (67.1 kg)  05/03/24 149 lb 6.4 oz (67.8 kg)        ASSESSMENT AND PLAN:  1.  Left subclavian artery stenosis: She is overall asymptomatic with no left arm claudication or symptoms of clinical steal syndrome.  Continue medical therapy. Her blood pressure should always be checked in the right arm which is more reflective of central aortic pressure.  2. Essential hypertension: Pressure is well-controlled.  3. Hyperlipidemia: Continue treatment with rosuvastatin .  Most recent lipid profile showed an LDL of 38.  4. Tobacco use: She is using electronic cigarettes and trying to quit smoking. I discussed with her the importance of complete cessation.  5.   large size abdominal aortic aneurysm: Followed by vascular surgery.  The patient is hesitant about getting this repaired given underlying chronic kidney disease and the high possibility of needing dialysis.  Also her anatomy seems to be difficult for endovascular intervention.   Disposition:   Follow-up in 12 months. Signed,  Deatrice Cage, MD  10/11/2024 11:24 AM    [facility]

## 2024-10-11 NOTE — Patient Instructions (Signed)
 Medication Instructions:  No changes *If you need a refill on your cardiac medications before your next appointment, please call your pharmacy*  Lab Work: None ordered  If you have labs (blood work) drawn today and your tests are completely normal, you will receive your results only by: MyChart Message (if you have MyChart) OR A paper copy in the mail If you have any lab test that is abnormal or we need to change your treatment, we will call you to review the results.  Testing/Procedures: None ordered  Follow-Up: At [facility], you and your health needs are our priority.  As part of our continuing mission to provide you with exceptional heart care, our providers are all part of one team.  This team includes your primary Cardiologist (physician) and Advanced Practice Providers or APPs (Physician Assistants and Nurse Practitioners) who all work together to provide you with the care you need, when you need it.  Your next appointment:   12 month(s)  Provider:   Antionette Kirks, MD    We recommend signing up for the patient portal called "MyChart".  Sign up information is provided on this After Visit Summary.  MyChart is used to connect with patients for Virtual Visits (Telemedicine).  Patients are able to view lab/test results, encounter notes, upcoming appointments, etc.  Non-urgent messages can be sent to your provider as well.   To learn more about what you can do with MyChart, go to [email or web address].

## 2024-10-18 ENCOUNTER — Ambulatory Visit: Payer: Self-pay

## 2024-10-18 NOTE — Telephone Encounter (Signed)
 FYI Only or Action Required?: FYI only for provider: appointment scheduled on 10/19/24.  Patient was last seen in primary care on 05/19/2024 by Purcell Emil Schanz, MD.  Called Nurse Triage reporting Ear Pain.  Symptoms began several days ago.  Interventions attempted: Ice/heat application.  Symptoms are: gradually worsening.  Triage Disposition: See Physician Within 24 Hours  Patient/caregiver understands and will follow disposition?:    Copied from CRM #[id number]. Topic: Clinical - Red Word Triage >> Oct 18, 2024  3:27 PM Sheila Melton wrote: Sheila Melton that prompted transfer to Nurse Triage: ear and eye pain   Pt called and ssays she wasn't an antibiotic sent in, pt has ear and eye ache, she says they are sore and painful, left eye swollen and hurts, Reason for Disposition  Earache  (Exceptions: Brief ear pain of lasting less than 60 minutes, or earache occurring during air travel.)  Answer Assessment - Initial Assessment Questions Pt called in stating that she was having L ear pain for the past couple days and this morning worsened, reports pain bilaterally. She states she also has developed L eye swelling that she feels is all related. She reports she is using ice on her eye to aid with swelling and takes percocet daily for her back but pain has continued. Discussed no congestion, fever, no changes in hearing or vision. Pt thinks she has an ear infection and would like ear exam. Discussed home care of warm compress to ears, avoiding Qtips, use of tylenol  for pain/swelling and cold compress to eye. Pt voiced understanding. Appointment scheduled for evaluation. Patient agrees with plan of care, and will call back if anything changes, or if symptoms worsen.     1. LOCATION: Which ear is involved?     Bilateral ears   2. ONSET: When did the ear pain start?      A few days ago; worse today bilaterally   3. SEVERITY: How bad is the pain?  (Scale 1-10; mild, moderate or severe)      8  4. URI SYMPTOMS: Do you have a runny nose or cough?     None   5. FEVER: Do you have a fever? If Yes, ask: What is your temperature, how was it measured, and when did it start?     None   6. CAUSE: Have you been swimming recently?, How often do you use Q-TIPS?, Have you had any recent air travel or scuba diving?     None   7. OTHER SYMPTOMS: Do you have any other symptoms? (e.g., decreased hearing, dizziness, headache, stiff neck, vomiting)     L eye swelling; denies congestion or changes with vision  Protocols used: Earache-A-AH

## 2024-10-19 ENCOUNTER — Encounter: Payer: Self-pay | Admitting: Internal Medicine

## 2024-10-19 ENCOUNTER — Ambulatory Visit: Admitting: Internal Medicine

## 2024-10-19 ENCOUNTER — Other Ambulatory Visit: Payer: Self-pay | Admitting: Internal Medicine

## 2024-10-19 ENCOUNTER — Ambulatory Visit: Payer: Self-pay

## 2024-10-19 ENCOUNTER — Ambulatory Visit: Payer: Self-pay | Admitting: Internal Medicine

## 2024-10-19 VITALS — BP 124/72 | HR 95 | Temp 98.1°F | Ht 64.0 in | Wt 149.0 lb

## 2024-10-19 DIAGNOSIS — N390 Urinary tract infection, site not specified: Secondary | ICD-10-CM | POA: Insufficient documentation

## 2024-10-19 DIAGNOSIS — L409 Psoriasis, unspecified: Secondary | ICD-10-CM | POA: Insufficient documentation

## 2024-10-19 DIAGNOSIS — H0100B Unspecified blepharitis left eye, upper and lower eyelids: Secondary | ICD-10-CM

## 2024-10-19 DIAGNOSIS — D225 Melanocytic nevi of trunk: Secondary | ICD-10-CM | POA: Insufficient documentation

## 2024-10-19 DIAGNOSIS — Z85828 Personal history of other malignant neoplasm of skin: Secondary | ICD-10-CM | POA: Insufficient documentation

## 2024-10-19 DIAGNOSIS — N185 Chronic kidney disease, stage 5: Secondary | ICD-10-CM

## 2024-10-19 DIAGNOSIS — H01009 Unspecified blepharitis unspecified eye, unspecified eyelid: Secondary | ICD-10-CM | POA: Insufficient documentation

## 2024-10-19 DIAGNOSIS — L821 Other seborrheic keratosis: Secondary | ICD-10-CM | POA: Insufficient documentation

## 2024-10-19 LAB — BASIC METABOLIC PANEL WITH GFR
BUN: 34 mg/dL — ABNORMAL HIGH (ref 6–23)
CO2: 24 meq/L (ref 19–32)
Calcium: 9.7 mg/dL (ref 8.4–10.5)
Chloride: 103 meq/L (ref 96–112)
Creatinine, Ser: 2.96 mg/dL — ABNORMAL HIGH (ref 0.40–1.20)
GFR: 14.34 mL/min — CL (ref >60.00)
Glucose, Bld: 191 mg/dL — ABNORMAL HIGH (ref 70–99)
Potassium: 4.1 meq/L (ref 3.5–5.1)
Sodium: 139 meq/L (ref 135–145)

## 2024-10-19 LAB — HEPATIC FUNCTION PANEL
ALT: 20 U/L (ref 0–35)
AST: 22 U/L (ref 0–37)
Albumin: 4.4 g/dL (ref 3.5–5.2)
Alkaline Phosphatase: 91 U/L (ref 39–117)
Bilirubin, Direct: 0.1 mg/dL (ref 0.0–0.3)
Total Bilirubin: 0.4 mg/dL (ref 0.2–1.2)
Total Protein: 7.2 g/dL (ref 6.0–8.3)

## 2024-10-19 LAB — CBC WITH DIFFERENTIAL/PLATELET
Basophils Absolute: 0.1 K/uL (ref 0.0–0.1)
Basophils Relative: 0.8 % (ref 0.0–3.0)
Eosinophils Absolute: 0.4 K/uL (ref 0.0–0.7)
Eosinophils Relative: 3 % (ref 0.0–5.0)
HCT: 33.4 % — ABNORMAL LOW (ref 36.0–46.0)
Hemoglobin: 11 g/dL — ABNORMAL LOW (ref 12.0–15.0)
Lymphocytes Relative: 19.5 % (ref 12.0–46.0)
Lymphs Abs: 2.4 K/uL (ref 0.7–4.0)
MCHC: 32.9 g/dL (ref 30.0–36.0)
MCV: 89.8 fl (ref 78.0–100.0)
Monocytes Absolute: 0.9 K/uL (ref 0.1–1.0)
Monocytes Relative: 7.6 % (ref 3.0–12.0)
Neutro Abs: 8.6 K/uL — ABNORMAL HIGH (ref 1.4–7.7)
Neutrophils Relative %: 69.1 % (ref 43.0–77.0)
Platelets: 284 K/uL (ref 150.0–400.0)
RBC: 3.72 Mil/uL — ABNORMAL LOW (ref 3.87–5.11)
RDW: 13.6 % (ref 11.5–15.5)
WBC: 12.5 K/uL — ABNORMAL HIGH (ref 4.0–10.5)

## 2024-10-19 MED ORDER — ONDANSETRON 4 MG PO TBDP: 4.0000 mg | ORAL_TABLET | Freq: Three times a day (TID) | ORAL | 1 refills | Status: DC | PRN

## 2024-10-19 MED ORDER — SULFAMETHOXAZOLE-TRIMETHOPRIM 800-160 MG PO TABS: 1.0000 | ORAL_TABLET | Freq: Two times a day (BID) | ORAL | 0 refills | Status: DC

## 2024-10-19 NOTE — Assessment & Plan Note (Signed)
 With some frequency it seems, pt reqeusts f/u urine and culture

## 2024-10-19 NOTE — Assessment & Plan Note (Signed)
 Mild to mod, can't r/o mrsa, has multiple antibiotic allergy, now for septra bid course,  to f/u any worsening symptoms or concerns

## 2024-10-19 NOTE — Assessment & Plan Note (Signed)
 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

## 2024-10-19 NOTE — Patient Instructions (Signed)
 Please take all new medication as prescribed - the antibiotic, and nausea medication as needed  Please continue all other medications as before, and refills have been done if requested.  Please have the pharmacy call with any other refills you may need.  Please keep your appointments with your specialists as you may have planned  Please go to the LAB at the blood drawing area for the tests to be done - including the urine testing  You will be contacted by phone if any changes need to be made immediately.  Otherwise, you will receive a letter about your results with an explanation, but please check with MyChart first.

## 2024-10-19 NOTE — Progress Notes (Signed)
 Patient ID: Sheila Melton, female   DOB: Feb 18, 1941, 83 y.o.   MRN: [id number]        Chief Complaint: follow up left upper blepharitis, bilateral ear pain, near syncope with nausea x 1 earlier today, ckd5, recent UTI       HPI:  Sheila Melton is a 83 y.o. female here with several complaints, the primary of which seems to be red, tender swelling to mostly left upper eyelid but has milder below the lower lid as well.  Also with bilateral ear pain but unclear reason, without fever, chills, worsening sinus pain, cough, ST.  Was in shower earlier today with wave of nausea and very lightheaded, had to be helped per husband, but no falls   Pt denies chest pain, increased sob or doe, wheezing, orthopnea, PND, increased LE swelling, palpitations.  Also recent UTI tx per renal, still has some urinary frequency but no dysuria or other.         Wt Readings from Last 3 Encounters:  10/19/24 149 lb (67.6 kg)  10/11/24 146 lb (66.2 kg)  05/19/24 148 lb (67.1 kg)   BP Readings from Last 3 Encounters:  10/19/24 124/72  10/11/24 136/64  05/19/24 122/60         Past Medical History:  Diagnosis Date   AAA (abdominal aortic aneurysm)    BEING WATCHED   VVS. 4.2 cm infrarenal abdominal aortic aneurysm without rupture noted 11/08/2016   Arthritis    Cancer (HCC)    skin cancer removed from left shoulder  2017   CAP (community acquired pneumonia) 09/02/13-11/05/13    with SIRS   Depression    ?? from prednisone     not on pred now   Diabetes mellitus    type ii    long time ago.   Family history of anesthesia complication    PATIENTS MOM HAD TROUBLE WAKING UP    GERD (gastroesophageal reflux disease)    will take occasional tums   Heart murmur    still has.   Sees Dr. Pietro   History of kidney stones    has them at the present time   Hyperlipidemia    Hypertension    unspecified essential   Renal insufficiency    FSGS   Subclavian steal syndrome    LEFT SIDE   NO SIDE EFFECTS   Subclavian  steal syndrome --  left    85% blocked   UTI (lower urinary tract infection) 08/26/13   Enterococcus and Escherichia coli both sensitive to nitrofurantoin    Past Surgical History:  Procedure Laterality Date   ABDOMINAL HYSTERECTOMY     BACK SURGERY     CATARACT EXTRACTION     OS   CYST EXCISION Right 05-23-13   Right index finger: cyst   DILATION AND CURETTAGE OF UTERUS     EYE SURGERY     g1 p1     SPINE SURGERY  Sept. 2013   VIDEO ASSISTED THORACOSCOPY (VATS)/ LOBECTOMY Right 02/16/2017   Procedure: Right VIDEO ASSISTED THORACOSCOPY (VATS)/ Right Middle LOBECTOMY;  Surgeon: Elspeth JAYSON Millers, MD;  Location: [facility] OR;  Service: Thoracic;  Laterality: Right;    reports that she has been smoking e-cigarettes and cigarettes. She has a 50 pack-year smoking history. She has never used smokeless tobacco. She reports current drug use. Drug: Hydrocodone . She reports that she does not drink alcohol. family history includes Coronary artery disease in an other family member; Deep vein thrombosis in her mother; Diabetes  in her father, sister, and another family member; Heart attack in her brother, father, and mother; Heart disease in her brother, father, and mother; Heart failure in her brother and mother; Hyperlipidemia in her brother, father, and sister; Hypertension in her daughter, father, mother, and sister; Other in her mother and sister; Parkinsonism in her brother; Varicose Veins in her mother. Allergies  Allergen Reactions   Rocephin  [Ceftriaxone  Sodium In Dextrose ] Dermatitis and Rash    10/24 /14 blisters of palms & diffuse rash Because of a history of documented adverse serious drug reaction;Medi Alert bracelet  is recommended   Amoxicillin Rash    Has patient had a PCN reaction causing IMMEDIATE RASH, FACIAL/TONGUE/THROAT SWELLING, SOB, OR LIGHTHEADEDNESS WITH HYPOTENSION:  #  #  #  YES  #  #  #  Has patient had a PCN reaction causing severe rash involving mucus membranes or skin  necrosis: No Has patient had a PCN reaction that required hospitalization No Has patient had a PCN reaction occurring within the last 10 years: No If all of the above answers are NO, then may proceed with Cephalosporin use.    Captopril Cough   Simvastatin  Other (See Comments)    MYALGIAS BUTTOCKS CRAMP   Bismuth Subsalicylate    Ciprofloxacin  Other (See Comments)    Sores in mouth    Iodinated Contrast Media    Semaglutide  Nausea And Vomiting   Adhesive [Tape] Rash   Latex Rash    Patient states she gets red and a rash when latex touches her.   Current Outpatient Medications on File Prior to Visit  Medication Sig Dispense Refill   amLODipine  (NORVASC ) 5 MG tablet Take 5 mg by mouth daily.     ascorbic Acid (VITAMIN C) 500 MG CPCR 1 cap PO QD     B Complex-C (SUPER B COMPLEX PO) Take 1 tablet by mouth daily.      Blood Glucose Monitoring Suppl (ONE TOUCH ULTRA 2) w/Device KIT Use as advised 1 each 0   calcium  carbonate (TUMS - DOSED IN MG ELEMENTAL CALCIUM ) 500 MG chewable tablet Chew 1 tablet by mouth daily as needed for indigestion or heartburn.     Cholecalciferol  (VITAMIN D3) 125 MCG (5000 UT) TABS Take 5,000 Units by mouth daily.     diazepam  (VALIUM ) 5 MG tablet TAKE ONE TABLET BY MOUTH AT BEDTIME AS NEEDED. 30 tablet 0   Dulaglutide  (TRULICITY ) 1.5 MG/0.5ML SOAJ Inject 1.5 mg (0.5ml)into the skin once a week. 2 mL 0   fexofenadine (ALLEGRA) 180 MG tablet Take 180 mg by mouth at bedtime.     FLUoxetine  (PROZAC ) 10 MG capsule TAKE ONE CAPSULE BY MOUTH DAILY 90 capsule 0   furosemide  (LASIX ) 40 MG tablet TAKE 1/2 TO 1 TABLET BY MOUTH DAILY AS NEEDED FOR FLUID 90 tablet 0   glipiZIDE  (GLUCOTROL ) 5 MG tablet TAKE ONE TABLET BY MOUTH EVERY MORNING and TAKE TWO TABLETS EVERY EVENING 270 tablet 0   glucose blood (ONETOUCH ULTRA) test strip CHECK BLOOD SUGAR TWICE DAILY 100 strip 3   KERENDIA 10 MG TABS Take 1 tablet by mouth daily.     labetalol  (NORMODYNE ) 300 MG tablet Take 1  tablet (300 mg total) by mouth 2 (two) times daily.     Liniments (SALONPAS  PAIN RELIEF PATCH EX) Apply 1 patch topically daily as needed (back pain).     losartan  (COZAAR ) 100 MG tablet Take 1 tablet (100 mg total) by mouth daily. 90 tablet 2   OVER THE  COUNTER MEDICATION CBD Gummies 2 each daily (Patient not taking: Reported on 10/11/2024)     oxyCODONE -acetaminophen  (PERCOCET/ROXICET) 5-325 MG tablet Take 1 tablet by mouth 3 (three) times daily as needed.     rosuvastatin  (CRESTOR ) 20 MG tablet TAKE ONE TABLET BY MOUTH DAILY 90 tablet 0   Triamcinolone  Acetonide (NASACORT  ALLERGY 24HR NA) Place 1 spray into the nose 2 (two) times daily as needed (congestion).     vitamin B-12 (CYANOCOBALAMIN) 1000 MCG tablet Take 1,000 mcg by mouth daily.     vitamin E 180 MG (400 UNITS) capsule Take 400 Units by mouth daily.     No current facility-administered medications on file prior to visit.        ROS:  All others reviewed and negative.  Objective        PE:  BP 124/72 (BP Location: Right Arm, Patient Position: Sitting, Cuff Size: Normal)   Pulse 95   Temp 98.1 F (36.7 C) (Oral)   Ht 5' 4 (1.626 m)   Wt 149 lb (67.6 kg)   SpO2 98%   BMI 25.58 kg/m                 Constitutional: Pt appears in NAD               HENT: Head: NCAT.                Right Ear: External ear normal.                 Left Ear: External ear normal.                Eyes: . Pupils are equal, round, and reactive to light. Conjunctivae and EOM are normal but has 1+ red tender left upper eyelid and lesser to lower eyelid               Nose: without d/c or deformity               Neck: Neck supple. Gross normal ROM               Cardiovascular: Normal rate and regular rhythm.                 Pulmonary/Chest: Effort normal and breath sounds without rales or wheezing.                               Neurological: Pt is alert. At baseline orientation, motor grossly intact               Skin: Skin is warm. No rashes, no other  new lesions, LE edema - none               Psychiatric: Pt behavior is normal without agitation   Micro: none  Cardiac tracings I have personally interpreted today:  none  Pertinent Radiological findings (summarize): none   Lab Results  Component Value Date   WBC 12.5 (H) 10/19/2024   HGB 11.0 (L) 10/19/2024   HCT 33.4 (L) 10/19/2024   PLT 284.0 10/19/2024   GLUCOSE 191 (H) 10/19/2024   CHOL 133 03/22/2024   TRIG 267.0 (H) 03/22/2024   HDL 41.60 03/22/2024   LDLDIRECT 47.0 03/19/2023   LDLCALC 38 03/22/2024   ALT 20 10/19/2024   AST 22 10/19/2024   NA 139 10/19/2024   K 4.1 10/19/2024   CL 103 10/19/2024   CREATININE 2.96 (H)  10/19/2024   BUN 34 (H) 10/19/2024   CO2 24 10/19/2024   TSH 3.19 09/23/2023   INR 0.95 02/12/2017   HGBA1C 7.0 (H) 03/22/2024   MICROALBUR 5.9 (H) 07/11/2009   Assessment/Plan:  Sheila Melton is a 83 y.o. White or Caucasian [1] female with  has a past medical history of AAA (abdominal aortic aneurysm), Arthritis, Cancer (HCC), CAP (community acquired pneumonia) (09/02/13-11/05/13), Depression, Diabetes mellitus, Family history of anesthesia complication, GERD (gastroesophageal reflux disease), Heart murmur, History of kidney stones, Hyperlipidemia, Hypertension, Renal insufficiency, Subclavian steal syndrome, Subclavian steal syndrome --  left, and UTI (lower urinary tract infection) (08/26/13).  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  Lynwood Rush, MD 10/19/2024 7:36 PM [facility]

## 2024-10-19 NOTE — Telephone Encounter (Signed)
 Stat lab GFR 14.34  Dr Theophilus notified at 06:05 Answer Assessment - Initial Assessment Questions 1. REASON FOR CALL or QUESTION: What is your reason for calling today? or How can I best     Lab on phone for stat lab result GFR 14.34. Dr Theophilus on call this evening and notified via phone at 06:05.  Protocols used: PCP Call - No Triage-A-AH

## 2024-10-20 ENCOUNTER — Telehealth: Payer: Self-pay

## 2024-10-20 LAB — URINALYSIS, ROUTINE W REFLEX MICROSCOPIC
Bilirubin Urine: NEGATIVE
Hgb urine dipstick: NEGATIVE
Ketones, ur: NEGATIVE
Nitrite: NEGATIVE
RBC / HPF: NONE SEEN (ref 0–?)
Specific Gravity, Urine: 1.02 (ref 1.000–1.030)
Total Protein, Urine: 100 — AB
Urine Glucose: NEGATIVE
Urobilinogen, UA: 0.2 (ref 0.0–1.0)
pH: 6 (ref 5.0–8.0)

## 2024-10-20 LAB — URINE CULTURE

## 2024-10-20 NOTE — Telephone Encounter (Signed)
 No ok to continue the antibiotic with the losartan , this is very commonly done, very low risk, and pharmacy is just trying to be helpful.   Thanks!

## 2024-10-20 NOTE — Telephone Encounter (Addendum)
 GFR stable for her.      Please call her and have her take the bactrim only once a day and only for 5 days.   We can recheck a urine if she is still having symptoms but with her decreased function I would recommend a decreased dose

## 2024-10-20 NOTE — Telephone Encounter (Signed)
 Very sorry, but I am unable to change due to her allergies and not being to take other antibiotic that would be helpful.   Sorry, thanks

## 2024-10-20 NOTE — Telephone Encounter (Signed)
 Spoke with patient today and she does not want to take the antibiotic.   She is going with what pharmacist said and does not want to take it due to the side effects.  She has been made aware that if she has any symptoms she should follow up with Dr. Geofm is in office.

## 2024-10-20 NOTE — Telephone Encounter (Signed)
 Spoke with patient today.

## 2024-10-20 NOTE — Telephone Encounter (Signed)
 Copied from CRM #[id number]. Topic: Clinical - Medication Question >> Oct 20, 2024  9:58 AM Franky GRADE wrote: Reason for CRM: Brook from [facility] is calling due to a drug interaction between sulfamethoxazole -trimethoprim (BACTRIM DS) 800-160 MG tablet [[id number]] & losartan  (COZAAR ) 100 MG tablet [[id number]]. She would like to know if the provider would like to change the medication and is it okay to fill for patient.

## 2024-10-20 NOTE — Telephone Encounter (Signed)
 Copied from CRM #[id number]. Topic: Clinical - Medication Question >> Oct 20, 2024  9:43 AM Charolett L wrote: Reason for CRM: Patient called in and stated that as per the pharmacy there will be an interaction between the new medication and the blood pressure medication. She's unsure of the reaction but a fax will be sent from the pharmacy with an explanation

## 2024-10-20 NOTE — Telephone Encounter (Signed)
 Spoke with patient today and info given.

## 2024-10-20 NOTE — Telephone Encounter (Signed)
 Spoke with patient today and she said she was contacted by the pharmacist and will not be taking antibiotic because she was told that it would interact with her BP and cause her heart to possibly race.  What would you like to do in terms of abx?  Switch her to something else or send this to Dr. Norleen to advise?

## 2024-10-20 NOTE — Telephone Encounter (Signed)
 Looks like Dr Norleen addressed this - would still recommend the lower dose

## 2024-10-22 ENCOUNTER — Encounter: Payer: Self-pay | Admitting: Internal Medicine

## 2024-10-24 ENCOUNTER — Other Ambulatory Visit: Payer: Self-pay

## 2024-10-24 ENCOUNTER — Telehealth: Payer: Self-pay

## 2024-10-24 MED ORDER — AZITHROMYCIN 250 MG PO TABS: ORAL_TABLET | ORAL | 1 refills | Status: AC

## 2024-10-24 MED ORDER — TRULICITY 1.5 MG/0.5ML ~~LOC~~ SOAJ: SUBCUTANEOUS | 0 refills | Status: DC

## 2024-10-24 NOTE — Telephone Encounter (Signed)
 Spoke with patient today.  Contacted pharmacy and spoke with Chauncey.  States script will be in tomorrow.  Patient advised that she needs follow up appointment.

## 2024-10-24 NOTE — Telephone Encounter (Signed)
 Copied from CRM #[id number]. Topic: Clinical - Prescription Issue >> Oct 24, 2024  1:32 PM Deaijah H wrote: Reason for CRM: Patient called in requesting to speak with [facility] CMA regarding Trulicity . Pharmacy does not have refill. Currently out if them, advised medication is pending. Please call [phone]

## 2024-10-24 NOTE — Telephone Encounter (Signed)
 Copied from CRM #[id number]. Topic: Clinical - Prescription Issue >> Oct 24, 2024 12:33 PM Deaijah H wrote: Reason for CRM: Katie w/ [facility] called in due to sending a refill request stated looks as if it was a transmission due to multiple request, never got an approval. Dulaglutide  (TRULICITY ) 1.5 MG/0.5ML SOAJ please call [phone]

## 2024-10-25 ENCOUNTER — Ambulatory Visit: Admitting: Internal Medicine

## 2024-10-31 NOTE — Progress Notes (Unsigned)
 "  Patient name: Sheila Melton MRN: [id number] DOB: 05-Sep-1941 Sex: female  REASON FOR VISIT: 6 month follow-up for AAA  HPI: Sheila Melton is a 83 y.o. female with hx chronic back pain with spinal stimulator, HTN, HLD, stage IV CKD, DM that presents for 6 month follow-up of her AAA.  Her aneurysm has been followed by Dr. Oris.  She had previously been followed by Dr. Oris up to 5 cm and he recommended follow-up with me.    Last had a CT on 05/03/2024 when her AAA measured 5.6 cm.  States she fell at home and had some back pain but no abdominal pain.   Past Medical History:  Diagnosis Date   AAA (abdominal aortic aneurysm)    BEING WATCHED   VVS. 4.2 cm infrarenal abdominal aortic aneurysm without rupture noted 11/08/2016   Arthritis    Cancer (HCC)    skin cancer removed from left shoulder  2017   CAP (community acquired pneumonia) 09/02/13-11/05/13    with SIRS   Depression    ?? from prednisone     not on pred now   Diabetes mellitus    type ii    long time ago.   Family history of anesthesia complication    PATIENTS MOM HAD TROUBLE WAKING UP    GERD (gastroesophageal reflux disease)    will take occasional tums   Heart murmur    still has.   Sees Dr. Pietro   History of kidney stones    has them at the present time   Hyperlipidemia    Hypertension    unspecified essential   Renal insufficiency    FSGS   Subclavian steal syndrome    LEFT SIDE   NO SIDE EFFECTS   Subclavian steal syndrome --  left    85% blocked   UTI (lower urinary tract infection) 08/26/13   Enterococcus and Escherichia coli both sensitive to nitrofurantoin     Past Surgical History:  Procedure Laterality Date   ABDOMINAL HYSTERECTOMY     BACK SURGERY     CATARACT EXTRACTION     OS   CYST EXCISION Right 05-23-13   Right index finger: cyst   DILATION AND CURETTAGE OF UTERUS     EYE SURGERY     g1 p1     SPINE SURGERY  Sept. 2013   VIDEO ASSISTED THORACOSCOPY (VATS)/ LOBECTOMY Right 02/16/2017    Procedure: Right VIDEO ASSISTED THORACOSCOPY (VATS)/ Right Middle LOBECTOMY;  Surgeon: Elspeth JAYSON Millers, MD;  Location: [facility] OR;  Service: Thoracic;  Laterality: Right;    Family History  Problem Relation Age of Onset   Heart failure Mother    Heart disease Mother    Hypertension Mother    Other Mother        varicose veins   Deep vein thrombosis Mother    Varicose Veins Mother    Heart attack Mother    Hypertension Sister        X 2   Diabetes Sister    Hyperlipidemia Sister    Other Sister        varicose veins   Heart disease Father    Diabetes Father    Hyperlipidemia Father    Hypertension Father    Heart attack Father    Heart disease Brother        MI in late 74s   Hyperlipidemia Brother    Heart attack Brother    Coronary artery disease Other    Diabetes  Other    Parkinsonism Brother    Heart failure Brother    Hypertension Daughter     SOCIAL HISTORY: Social History   Tobacco Use   Smoking status: Light Smoker    Current packs/day: 1.00    Average packs/day: 1 pack/day for 50.0 years (50.0 ttl pk-yrs)    Types: E-cigarettes, Cigarettes   Smokeless tobacco: Never   Tobacco comments:    e-cigs  Substance Use Topics   Alcohol use: No    Allergies  Allergen Reactions   Rocephin  [Ceftriaxone  Sodium In Dextrose ] Dermatitis and Rash    10/24 /14 blisters of palms & diffuse rash Because of a history of documented adverse serious drug reaction;Medi Alert bracelet  is recommended   Amoxicillin Rash    Has patient had a PCN reaction causing IMMEDIATE RASH, FACIAL/TONGUE/THROAT SWELLING, SOB, OR LIGHTHEADEDNESS WITH HYPOTENSION:  #  #  #  YES  #  #  #  Has patient had a PCN reaction causing severe rash involving mucus membranes or skin necrosis: No Has patient had a PCN reaction that required hospitalization No Has patient had a PCN reaction occurring within the last 10 years: No If all of the above answers are NO, then may proceed with Cephalosporin  use.    Captopril Cough   Simvastatin  Other (See Comments)    MYALGIAS BUTTOCKS CRAMP   Bismuth Subsalicylate    Ciprofloxacin  Other (See Comments)    Sores in mouth    Iodinated Contrast Media    Semaglutide  Nausea And Vomiting   Adhesive [Tape] Rash   Latex Rash    Patient states she gets red and a rash when latex touches her.    Current Outpatient Medications  Medication Sig Dispense Refill   amLODipine  (NORVASC ) 5 MG tablet Take 5 mg by mouth daily.     ascorbic Acid (VITAMIN C) 500 MG CPCR 1 cap PO QD     B Complex-C (SUPER B COMPLEX PO) Take 1 tablet by mouth daily.      Blood Glucose Monitoring Suppl (ONE TOUCH ULTRA 2) w/Device KIT Use as advised 1 each 0   calcium  carbonate (TUMS - DOSED IN MG ELEMENTAL CALCIUM ) 500 MG chewable tablet Chew 1 tablet by mouth daily as needed for indigestion or heartburn.     Cholecalciferol  (VITAMIN D3) 125 MCG (5000 UT) TABS Take 5,000 Units by mouth daily.     diazepam  (VALIUM ) 5 MG tablet TAKE ONE TABLET BY MOUTH AT BEDTIME AS NEEDED. 30 tablet 0   Dulaglutide  (TRULICITY ) 1.5 MG/0.5ML SOAJ Inject 1.5 mg (0.5ml)into the skin once a week. 2 mL 0   fexofenadine (ALLEGRA) 180 MG tablet Take 180 mg by mouth at bedtime.     FLUoxetine  (PROZAC ) 10 MG capsule TAKE ONE CAPSULE BY MOUTH DAILY 90 capsule 0   furosemide  (LASIX ) 40 MG tablet TAKE 1/2 TO 1 TABLET BY MOUTH DAILY AS NEEDED FOR FLUID 90 tablet 0   glipiZIDE  (GLUCOTROL ) 5 MG tablet TAKE ONE TABLET BY MOUTH EVERY MORNING and TAKE TWO TABLETS EVERY EVENING 270 tablet 0   glucose blood (ONETOUCH ULTRA) test strip CHECK BLOOD SUGAR TWICE DAILY 100 strip 3   KERENDIA 10 MG TABS Take 1 tablet by mouth daily.     labetalol  (NORMODYNE ) 300 MG tablet Take 1 tablet (300 mg total) by mouth 2 (two) times daily.     Liniments (SALONPAS  PAIN RELIEF PATCH EX) Apply 1 patch topically daily as needed (back pain).     losartan  (COZAAR )  100 MG tablet Take 1 tablet (100 mg total) by mouth daily. 90 tablet 2    ondansetron  (ZOFRAN -ODT) 4 MG disintegrating tablet Take 1 tablet (4 mg total) by mouth every 8 (eight) hours as needed. 30 tablet 1   OVER THE COUNTER MEDICATION CBD Gummies 2 each daily (Patient not taking: Reported on 10/11/2024)     oxyCODONE -acetaminophen  (PERCOCET/ROXICET) 5-325 MG tablet Take 1 tablet by mouth 3 (three) times daily as needed.     rosuvastatin  (CRESTOR ) 20 MG tablet TAKE ONE TABLET BY MOUTH DAILY 90 tablet 0   sulfamethoxazole -trimethoprim  (BACTRIM  DS) 800-160 MG tablet Take 1 tablet by mouth 2 (two) times daily. 20 tablet 0   Triamcinolone  Acetonide (NASACORT  ALLERGY 24HR NA) Place 1 spray into the nose 2 (two) times daily as needed (congestion).     vitamin B-12 (CYANOCOBALAMIN) 1000 MCG tablet Take 1,000 mcg by mouth daily.     vitamin E 180 MG (400 UNITS) capsule Take 400 Units by mouth daily.     No current facility-administered medications for this visit.    REVIEW OF SYSTEMS:  [X]  denotes positive finding, [ ]  denotes negative finding Cardiac  Comments:  Chest pain or chest pressure:    Shortness of breath upon exertion:    Short of breath when lying flat:    Irregular heart rhythm:        Vascular    Pain in calf, thigh, or hip brought on by ambulation:    Pain in feet at night that wakes you up from your sleep:     Blood clot in your veins:    Leg swelling:         Pulmonary    Oxygen at home:    Productive cough:     Wheezing:         Neurologic    Sudden weakness in arms or legs:     Sudden numbness in arms or legs:     Sudden onset of difficulty speaking or slurred speech:    Temporary loss of vision in one eye:     Problems with dizziness:         Gastrointestinal    Blood in stool:     Vomited blood:         Genitourinary    Burning when urinating:     Blood in urine:        Psychiatric    Major depression:         Hematologic    Bleeding problems:    Problems with blood clotting too easily:        Skin    Rashes or ulcers:         Constitutional    Fever or chills:      PHYSICAL EXAM: There were no vitals filed for this visit.   GENERAL: The patient is a well-nourished female, in no acute distress. The vital signs are documented above. CARDIAC: There is a regular rate and rhythm.  VASCULAR:  1+ palpable femoral pulses bilaterally No palpable pedal pulses PULMONARY: No respiratory distress. ABDOMEN: Soft and non-tender.  No pain with palpation of aneurysm. MUSCULOSKELETAL: There are no major deformities or cyanosis. NEUROLOGIC: No focal weakness or paresthesias are detected. PSYCHIATRIC: The patient has a normal affect.  DATA:   AAA duplex today suggest aneurysm 6.1 cm  CT abd pelvis reviewed 04/26/24 reviewed and slight increase in AAA from 5.4 to 5.6 cm  Assessment/Plan:  83 year old female with multiple comorbidities including stage IV  chronic kidney disease that presents for 6 month follow-up and continued surveillance of her 5.6 cm abdominal aortic aneurysm.   Today on duplex this measures 6.1 cm.  I discussed there has been some interval growth over the last 6 months.   I again discussed that at the size of her AAA we would normally recommend repair as we recommend repair greater than 5 cm in women given risk of rupture.  I have quoted her a 15 to 20% rupture risk on a yearly basis.  Discussed 90% of people do not survive if she has a ruptured aneurysm.    She has several complicating factors including very diseased calcified small iliac arteries as well as her chronic kidney disease stage IV that would make her high risk for complications including needing dialysis.  I discussed all this with the patient today and her husband.  She is still against surgery.  States she does not want any risk of dialysis.  I do not think she is an open surgery candidate.  Her best option would be bifurcated stent graft repair versus AUI with femorofemoral bypass.   I discussed the next step would be CO2 arteriogram  in the Cath Lab to evaluate her access for stent graft placement.  I offered to get this scheduled today but again she does not want surgery.  I discussed if her aneurysm ruptured I would not recommend emergent surgery given she has declined elective repair.  Sheila DOROTHA Gaskins, MD [facility] Office: [phone]    "

## 2024-11-01 ENCOUNTER — Encounter: Payer: Self-pay | Admitting: Vascular Surgery

## 2024-11-01 ENCOUNTER — Ambulatory Visit: Admitting: Vascular Surgery

## 2024-11-01 ENCOUNTER — Ambulatory Visit (HOSPITAL_COMMUNITY)
Admission: RE | Admit: 2024-11-01 | Discharge: 2024-11-01 | Disposition: A | Discharge status: Home / Self Care | Source: Ambulatory Visit | Attending: Vascular Surgery | Admitting: Vascular Surgery

## 2024-11-01 VITALS — BP 166/86 | HR 82 | Temp 98.1°F | Resp 20 | Ht 64.0 in | Wt 149.0 lb

## 2024-11-01 DIAGNOSIS — I7143 Infrarenal abdominal aortic aneurysm, without rupture: Secondary | ICD-10-CM | POA: Insufficient documentation

## 2024-11-07 ENCOUNTER — Other Ambulatory Visit: Payer: Self-pay | Admitting: *Deleted

## 2024-11-07 DIAGNOSIS — I7143 Infrarenal abdominal aortic aneurysm, without rupture: Secondary | ICD-10-CM

## 2024-11-17 ENCOUNTER — Other Ambulatory Visit: Payer: Self-pay | Admitting: Internal Medicine

## 2024-11-27 ENCOUNTER — Encounter: Payer: Self-pay | Admitting: Internal Medicine

## 2024-11-27 NOTE — Patient Instructions (Addendum)
" ° ° ° ° °  Your A1c was checked    Medications changes include :   None    A referral was ordered and someone will call you to schedule an appointment.     Return in about 6 months (around 05/28/2025) for Physical Exam.  "

## 2024-11-27 NOTE — Assessment & Plan Note (Signed)
 Stable With proteinuria Deferred checking urine albumin /cr ratio Following with [facility] - Dr Tobie

## 2024-11-27 NOTE — Progress Notes (Unsigned)
 "     Subjective:    Patient ID: [name], female    DOB: 1941-04-30, 84 y.o.   MRN: [id number]     HPI Sheila Melton is here for follow up of her chronic medical problems.  Diarrhea is better with the decreased Trulicity  dose.  She still has some mild diarrhea but she states it is tolerable and she does not want to change her medication.  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   Discussed the use of AI scribe software for clinical note transcription with the patient, who gave verbal consent to proceed.  History of Present Illness Sheila Melton is an 84 year old female with diabetes and hypertension who presents for follow-up of her blood pressure management and diabetes control.  Her A1c is 6.3. She had previously seen another doctor who reduced her Trulicity  dose to 1.5 mg due to diarrhea, which improved but still occurs occasionally. She has been using Imodium frequently and has started wearing pull-ups due to the diarrhea.  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 She is hesitant to proceed with surgery due to the potential risk of kidney injury from the dye used, which could lead to dialysis.  She experiences lightheadedness when standing. She takes labetalol  twice daily, but the timing is inconsistent. She also takes amlodipine . She has fallen several times recently, with the last fall resulting in significant back pain, requiring two pain pills daily. Her back pain has worsened, and pain medication makes her sleepy unless she is active.  She has experienced increased anxiety due to her grandson staying with her and her husband for five weeks. He was involved with drugs and alcohol, dropped out of college, and is now working two jobs. She worries about his well-being and the disruption to their routine.      Medications and allergies reviewed with patient and updated if appropriate.  Medications Ordered Prior to Encounter[1]   Review of Systems   Constitutional:  Negative for fever.  Respiratory:  Negative for cough, shortness of breath and wheezing.   Cardiovascular:  Negative for chest pain, palpitations and leg swelling.  Musculoskeletal:  Positive for back pain.  Neurological:  Positive for light-headedness. Negative for headaches.       Objective:   Vitals:   11/28/24 1512  BP: 120/68  Pulse: 95  Temp: 98.1 F (36.7 C)  SpO2: 98%   BP Readings from Last 3 Encounters:  11/28/24 120/68  11/01/24 (!) 166/86  10/19/24 124/72   Wt Readings from Last 3 Encounters:  11/28/24 149 lb (67.6 kg)  11/01/24 149 lb (67.6 kg)  10/19/24 149 lb (67.6 kg)   Body mass index is 25.58 kg/m.    Physical Exam Constitutional:      General: She is not in acute distress.    Appearance: Normal appearance.  HENT:     Head: Normocephalic and atraumatic.  Eyes:     Conjunctiva/sclera: Conjunctivae normal.  Cardiovascular:     Rate and Rhythm: Normal rate and regular rhythm.     Heart sounds: Normal heart sounds.  Pulmonary:     Effort: Pulmonary effort is normal. No respiratory distress.     Breath sounds: Normal breath sounds. No wheezing.  Musculoskeletal:     Cervical back: Neck supple.     Right lower leg: No edema.     Left lower leg: No edema.  Lymphadenopathy:     Cervical: No  cervical adenopathy.  Skin:    General: Skin is warm and dry.     Findings: No rash.  Neurological:     Mental Status: She is alert. Mental status is at baseline.  Psychiatric:        Mood and Affect: Mood normal.        Behavior: Behavior normal.        Lab Results  Component Value Date   WBC 12.5 (H) 10/19/2024   HGB 11.0 (L) 10/19/2024   HCT 33.4 (L) 10/19/2024   PLT 284.0 10/19/2024   GLUCOSE 191 (H) 10/19/2024   CHOL 133 03/22/2024   TRIG 267.0 (H) 03/22/2024   HDL 41.60 03/22/2024   LDLDIRECT 47.0 03/19/2023   LDLCALC 38 03/22/2024   ALT 20 10/19/2024   AST 22 10/19/2024   NA 139 10/19/2024   K 4.1 10/19/2024   CL  103 10/19/2024   CREATININE 2.96 (H) 10/19/2024   BUN 34 (H) 10/19/2024   CO2 24 10/19/2024   TSH 3.19 09/23/2023   INR 0.95 02/12/2017   HGBA1C 6.3 (A) 11/28/2024   HGBA1C 6.3 11/28/2024   HGBA1C 6.3 11/28/2024   HGBA1C 6.3 11/28/2024   MICROALBUR 5.9 (H) 07/11/2009     Assessment & Plan:    See Problem List for Assessment and Plan of chronic medical problems.        [1]  Current Outpatient Medications on File Prior to Visit  Medication Sig Dispense Refill   ascorbic Acid (VITAMIN C) 500 MG CPCR 1 cap PO QD     B Complex-C (SUPER B COMPLEX PO) Take 1 tablet by mouth daily.      Blood Glucose Monitoring Suppl (ONE TOUCH ULTRA 2) w/Device KIT Use as advised 1 each 0   calcium  carbonate (TUMS - DOSED IN MG ELEMENTAL CALCIUM ) 500 MG chewable tablet Chew 1 tablet by mouth daily as needed for indigestion or heartburn.     Cholecalciferol  (VITAMIN D3) 125 MCG (5000 UT) TABS Take 5,000 Units by mouth daily.     diazepam  (VALIUM ) 5 MG tablet TAKE ONE TABLET BY MOUTH AT BEDTIME AS NEEDED. 30 tablet 0   Dulaglutide  (TRULICITY ) 1.5 MG/0.5ML SOAJ INJECT 1.5MG  INTO THE SKIN ONCE A WEEK. 2 mL 0   fexofenadine (ALLEGRA) 180 MG tablet Take 180 mg by mouth at bedtime.     FLUoxetine  (PROZAC ) 10 MG capsule TAKE ONE CAPSULE BY MOUTH DAILY 90 capsule 0   furosemide  (LASIX ) 40 MG tablet TAKE 1/2 TO 1 TABLET BY MOUTH DAILY AS NEEDED FOR FLUID 90 tablet 0   glipiZIDE  (GLUCOTROL ) 5 MG tablet TAKE ONE TABLET BY MOUTH EVERY MORNING and TAKE TWO TABLETS EVERY EVENING 270 tablet 0   glucose blood (ONETOUCH ULTRA) test strip CHECK BLOOD SUGAR TWICE DAILY 100 strip 3   KERENDIA 10 MG TABS Take 1 tablet by mouth daily.     labetalol  (NORMODYNE ) 300 MG tablet Take 1 tablet (300 mg total) by mouth 2 (two) times daily.     Liniments (SALONPAS  PAIN RELIEF PATCH EX) Apply 1 patch topically daily as needed (back pain).     losartan  (COZAAR ) 100 MG tablet Take 1 tablet (100 mg total) by mouth daily. 90 tablet 2    oxyCODONE -acetaminophen  (PERCOCET/ROXICET) 5-325 MG tablet Take 1 tablet by mouth 3 (three) times daily as needed.     rosuvastatin  (CRESTOR ) 20 MG tablet TAKE ONE TABLET BY MOUTH DAILY 90 tablet 0   Triamcinolone  Acetonide (NASACORT  ALLERGY 24HR NA) Place 1 spray into  the nose 2 (two) times daily as needed (congestion).     vitamin B-12 (CYANOCOBALAMIN) 1000 MCG tablet Take 1,000 mcg by mouth daily.     vitamin E 180 MG (400 UNITS) capsule Take 400 Units by mouth daily.     No current facility-administered medications on file prior to visit.   "

## 2024-11-28 ENCOUNTER — Ambulatory Visit: Admitting: Internal Medicine

## 2024-11-28 VITALS — BP 120/68 | HR 95 | Temp 98.1°F | Ht 64.0 in | Wt 149.0 lb

## 2024-11-28 DIAGNOSIS — N051 Unspecified nephritic syndrome with focal and segmental glomerular lesions: Secondary | ICD-10-CM | POA: Diagnosis not present

## 2024-11-28 DIAGNOSIS — F411 Generalized anxiety disorder: Secondary | ICD-10-CM

## 2024-11-28 DIAGNOSIS — F3289 Other specified depressive episodes: Secondary | ICD-10-CM

## 2024-11-28 DIAGNOSIS — N185 Chronic kidney disease, stage 5: Secondary | ICD-10-CM | POA: Diagnosis not present

## 2024-11-28 DIAGNOSIS — I7143 Infrarenal abdominal aortic aneurysm, without rupture: Secondary | ICD-10-CM | POA: Diagnosis not present

## 2024-11-28 DIAGNOSIS — G479 Sleep disorder, unspecified: Secondary | ICD-10-CM

## 2024-11-28 DIAGNOSIS — E1159 Type 2 diabetes mellitus with other circulatory complications: Secondary | ICD-10-CM

## 2024-11-28 DIAGNOSIS — E1122 Type 2 diabetes mellitus with diabetic chronic kidney disease: Secondary | ICD-10-CM | POA: Diagnosis not present

## 2024-11-28 DIAGNOSIS — E1169 Type 2 diabetes mellitus with other specified complication: Secondary | ICD-10-CM | POA: Diagnosis not present

## 2024-11-28 DIAGNOSIS — Z7984 Long term (current) use of oral hypoglycemic drugs: Secondary | ICD-10-CM | POA: Diagnosis not present

## 2024-11-28 DIAGNOSIS — I152 Hypertension secondary to endocrine disorders: Secondary | ICD-10-CM | POA: Diagnosis not present

## 2024-11-28 DIAGNOSIS — E785 Hyperlipidemia, unspecified: Secondary | ICD-10-CM

## 2024-11-28 LAB — POCT GLYCOSYLATED HEMOGLOBIN (HGB A1C)
HbA1c POC (<> result, manual entry): 6.3 %
HbA1c, POC (controlled diabetic range): 6.3 % (ref 0.0–7.0)
HbA1c, POC (prediabetic range): 6.3 % (ref 5.7–6.4)
Hemoglobin A1C: 6.3 % — AB (ref 4.0–5.6)

## 2024-11-28 MED ORDER — AMLODIPINE BESYLATE 2.5 MG PO TABS: 2.5000 mg | ORAL_TABLET | Freq: Every day | ORAL | 2 refills | Status: DC

## 2024-11-28 NOTE — Assessment & Plan Note (Signed)
 Chronic Following with vascular surgery Aneurysm has gotten larger but she has decided not to have surgery

## 2024-11-28 NOTE — Assessment & Plan Note (Signed)
 Chronic Controlled, Stable Continue fluoxetine  10 mg daily, Valium  5 mg at bedtime as needed

## 2024-11-28 NOTE — Assessment & Plan Note (Signed)
 Chronic Lab Results  Component Value Date   LDLCALC 38 03/22/2024    Continue Crestor  20 mg daily

## 2024-11-28 NOTE — Assessment & Plan Note (Signed)
 Chronic Controlled, Stable Continue fluoxetine 10 mg daily

## 2024-11-28 NOTE — Assessment & Plan Note (Signed)
 Chronic Blood pressure on low side and having some lightheadedness Continue labetalol  300 mg twice daily - try to space out to Q12 hrs, losartan  100 mg daily Decrease amlodipine  to 2.5 mg daily Monitor Bp at home

## 2024-11-28 NOTE — Assessment & Plan Note (Signed)
 Chronic Associated with chronic kidney disease, hyperlipidemia  Lab Results  Component Value Date   HGBA1C 6.3 (A) 11/28/2024   HGBA1C 6.3 11/28/2024   HGBA1C 6.3 11/28/2024   HGBA1C 6.3 11/28/2024   Sugars well controlled Check A1c Continue Trulicity  to 1.5 mg weekly,  Continue glipizide  5 mg q am, 10 mg q pm Stressed diabetic diet

## 2024-11-28 NOTE — Assessment & Plan Note (Signed)
 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 Tobie On [place]

## 2024-11-28 NOTE — Assessment & Plan Note (Signed)
Chronic °Controlled, Stable °Continue diazepam 5 mg nightly °

## 2024-11-29 ENCOUNTER — Telehealth: Payer: Self-pay

## 2024-11-29 ENCOUNTER — Other Ambulatory Visit: Payer: Self-pay | Admitting: Internal Medicine

## 2024-11-29 NOTE — Telephone Encounter (Signed)
 Copied from CRM [id number]. Topic: Clinical - Prescription Issue >> Nov 29, 2024  1:53 PM Willma R wrote: Reason for CRM: Patient calling in to let Dr Geofm know that she has not been taking any amlodipine , believes another Dr took her off it. States she doesn't state that she needs to take the prescription, amLODipine  (NORVASC ) 2.5 MG tablet, that was sent in yesterday.  Patient can be reached at [phone]

## 2024-11-30 ENCOUNTER — Other Ambulatory Visit: Payer: Self-pay

## 2024-11-30 NOTE — Telephone Encounter (Signed)
Script has been removed

## 2024-11-30 NOTE — Telephone Encounter (Signed)
 Okay-noted.  Thanks for updating the list.  Not sure if she needs a callback or not.

## 2024-11-30 NOTE — Telephone Encounter (Signed)
 Spoke with patient today.

## 2024-12-01 ENCOUNTER — Other Ambulatory Visit: Payer: Self-pay | Admitting: Internal Medicine

## 2024-12-09 ENCOUNTER — Other Ambulatory Visit: Payer: Self-pay | Admitting: Internal Medicine

## 2025-05-23 ENCOUNTER — Ambulatory Visit: Admitting: Vascular Surgery

## 2025-05-23 ENCOUNTER — Ambulatory Visit (HOSPITAL_COMMUNITY)
